# Patient Record
Sex: Male | Born: 1935 | Race: Black or African American | Hispanic: No | Smoking: Former smoker
Health system: Southern US, Community
[De-identification: ages and names within clinical notes are randomized; demographics above are authoritative.]

## PROBLEM LIST (undated history)

## (undated) DIAGNOSIS — I82409 Acute embolism and thrombosis of unspecified deep veins of unspecified lower extremity: Secondary | ICD-10-CM

## (undated) DIAGNOSIS — L409 Psoriasis, unspecified: Secondary | ICD-10-CM

## (undated) DIAGNOSIS — M109 Gout, unspecified: Secondary | ICD-10-CM

## (undated) DIAGNOSIS — N189 Chronic kidney disease, unspecified: Secondary | ICD-10-CM

## (undated) DIAGNOSIS — E119 Type 2 diabetes mellitus without complications: Secondary | ICD-10-CM

## (undated) DIAGNOSIS — I509 Heart failure, unspecified: Secondary | ICD-10-CM

## (undated) DIAGNOSIS — I10 Essential (primary) hypertension: Secondary | ICD-10-CM

## (undated) DIAGNOSIS — I499 Cardiac arrhythmia, unspecified: Secondary | ICD-10-CM

## (undated) DIAGNOSIS — I2510 Atherosclerotic heart disease of native coronary artery without angina pectoris: Secondary | ICD-10-CM

## (undated) DIAGNOSIS — I889 Nonspecific lymphadenitis, unspecified: Secondary | ICD-10-CM

## (undated) HISTORY — DX: Acute embolism and thrombosis of unspecified deep veins of unspecified lower extremity: I82.409

---

## 1997-09-28 ENCOUNTER — Emergency Department (HOSPITAL_COMMUNITY): Admission: EM | Admit: 1997-09-28 | Discharge: 1997-09-28 | Payer: Self-pay | Admitting: Emergency Medicine

## 1997-10-14 ENCOUNTER — Emergency Department (HOSPITAL_COMMUNITY): Admission: EM | Admit: 1997-10-14 | Discharge: 1997-10-14 | Payer: Self-pay | Admitting: Emergency Medicine

## 1998-01-14 ENCOUNTER — Emergency Department (HOSPITAL_COMMUNITY): Admission: EM | Admit: 1998-01-14 | Discharge: 1998-01-14 | Payer: Self-pay | Admitting: *Deleted

## 2000-02-29 ENCOUNTER — Emergency Department (HOSPITAL_COMMUNITY): Admission: EM | Admit: 2000-02-29 | Discharge: 2000-03-01 | Payer: Self-pay | Admitting: Emergency Medicine

## 2000-08-10 ENCOUNTER — Emergency Department (HOSPITAL_COMMUNITY): Admission: EM | Admit: 2000-08-10 | Discharge: 2000-08-10 | Payer: Self-pay | Admitting: Internal Medicine

## 2000-08-21 ENCOUNTER — Emergency Department (HOSPITAL_COMMUNITY): Admission: EM | Admit: 2000-08-21 | Discharge: 2000-08-21 | Payer: Self-pay

## 2000-10-15 ENCOUNTER — Emergency Department (HOSPITAL_COMMUNITY): Admission: EM | Admit: 2000-10-15 | Discharge: 2000-10-15 | Payer: Self-pay | Admitting: Emergency Medicine

## 2000-10-15 ENCOUNTER — Encounter: Payer: Self-pay | Admitting: Emergency Medicine

## 2000-10-28 ENCOUNTER — Emergency Department (HOSPITAL_COMMUNITY): Admission: EM | Admit: 2000-10-28 | Discharge: 2000-10-28 | Payer: Self-pay

## 2001-01-15 ENCOUNTER — Emergency Department (HOSPITAL_COMMUNITY): Admission: AC | Admit: 2001-01-15 | Discharge: 2001-01-15 | Payer: Self-pay

## 2001-01-15 ENCOUNTER — Encounter: Payer: Self-pay | Admitting: Emergency Medicine

## 2003-02-21 ENCOUNTER — Emergency Department (HOSPITAL_COMMUNITY): Admission: EM | Admit: 2003-02-21 | Discharge: 2003-02-21 | Payer: Self-pay | Admitting: Emergency Medicine

## 2003-11-09 ENCOUNTER — Emergency Department (HOSPITAL_COMMUNITY): Admission: EM | Admit: 2003-11-09 | Discharge: 2003-11-09 | Payer: Self-pay

## 2003-11-23 ENCOUNTER — Emergency Department (HOSPITAL_COMMUNITY): Admission: EM | Admit: 2003-11-23 | Discharge: 2003-11-24 | Payer: Self-pay | Admitting: Emergency Medicine

## 2004-12-02 ENCOUNTER — Ambulatory Visit: Payer: Self-pay | Admitting: Internal Medicine

## 2005-08-20 ENCOUNTER — Ambulatory Visit: Payer: Self-pay | Admitting: Internal Medicine

## 2005-08-22 ENCOUNTER — Ambulatory Visit: Payer: Self-pay | Admitting: Nurse Practitioner

## 2005-09-12 ENCOUNTER — Ambulatory Visit: Payer: Self-pay | Admitting: Nurse Practitioner

## 2005-09-26 ENCOUNTER — Ambulatory Visit: Payer: Self-pay | Admitting: Nurse Practitioner

## 2006-01-20 ENCOUNTER — Ambulatory Visit: Payer: Self-pay | Admitting: Nurse Practitioner

## 2006-03-15 ENCOUNTER — Emergency Department (HOSPITAL_COMMUNITY): Admission: EM | Admit: 2006-03-15 | Discharge: 2006-03-15 | Payer: Self-pay | Admitting: Emergency Medicine

## 2006-06-15 ENCOUNTER — Ambulatory Visit: Payer: Self-pay | Admitting: Internal Medicine

## 2007-05-25 ENCOUNTER — Encounter (INDEPENDENT_AMBULATORY_CARE_PROVIDER_SITE_OTHER): Payer: Self-pay | Admitting: Nurse Practitioner

## 2007-05-25 ENCOUNTER — Ambulatory Visit: Payer: Self-pay | Admitting: Family Medicine

## 2007-05-25 LAB — CONVERTED CEMR LAB
AST: 26 units/L (ref 0–37)
Albumin: 4.3 g/dL (ref 3.5–5.2)
BUN: 20 mg/dL (ref 6–23)
CO2: 22 meq/L (ref 19–32)
Creatinine, Ser: 1.35 mg/dL (ref 0.40–1.50)
Eosinophils Relative: 5 % (ref 0–5)
HCT: 46.2 % (ref 39.0–52.0)
Lymphocytes Relative: 24 % (ref 12–46)
MCHC: 32.7 g/dL (ref 30.0–36.0)
MCV: 96.9 fL (ref 78.0–100.0)
Monocytes Absolute: 0.6 10*3/uL (ref 0.1–1.0)
Monocytes Relative: 16 % — ABNORMAL HIGH (ref 3–12)
Neutro Abs: 2 10*3/uL (ref 1.7–7.7)
Neutrophils Relative %: 55 % (ref 43–77)
Platelets: 262 10*3/uL (ref 150–400)
TSH: 1.995 microintl units/mL (ref 0.350–5.50)
Total Bilirubin: 0.7 mg/dL (ref 0.3–1.2)

## 2007-07-06 ENCOUNTER — Encounter: Admission: RE | Admit: 2007-07-06 | Discharge: 2007-07-06 | Payer: Self-pay | Admitting: Nurse Practitioner

## 2007-07-21 ENCOUNTER — Ambulatory Visit: Payer: Self-pay | Admitting: Internal Medicine

## 2007-09-11 ENCOUNTER — Emergency Department (HOSPITAL_COMMUNITY): Admission: EM | Admit: 2007-09-11 | Discharge: 2007-09-11 | Payer: Self-pay | Admitting: Emergency Medicine

## 2007-11-17 ENCOUNTER — Ambulatory Visit: Payer: Self-pay | Admitting: Internal Medicine

## 2007-11-17 LAB — CONVERTED CEMR LAB
ALT: 12 units/L (ref 0–53)
AST: 22 units/L (ref 0–37)
Calcium: 9.3 mg/dL (ref 8.4–10.5)
Chloride: 104 meq/L (ref 96–112)
PSA: 4.86 ng/mL — ABNORMAL HIGH (ref 0.10–4.00)
Sodium: 139 meq/L (ref 135–145)

## 2008-03-21 ENCOUNTER — Ambulatory Visit: Payer: Self-pay | Admitting: Internal Medicine

## 2008-03-21 LAB — CONVERTED CEMR LAB
CO2: 25 meq/L (ref 19–32)
Calcium: 9 mg/dL (ref 8.4–10.5)
Chloride: 103 meq/L (ref 96–112)
Potassium: 4.2 meq/L (ref 3.5–5.3)
Sodium: 139 meq/L (ref 135–145)

## 2008-05-10 ENCOUNTER — Ambulatory Visit: Payer: Self-pay | Admitting: Internal Medicine

## 2008-05-10 LAB — CONVERTED CEMR LAB
ALT: 14 units/L (ref 0–53)
AST: 29 units/L (ref 0–37)
Alkaline Phosphatase: 51 units/L (ref 39–117)
Chloride: 102 meq/L (ref 96–112)
Creatinine, Ser: 1.24 mg/dL (ref 0.40–1.50)
LDL Cholesterol: 136 mg/dL — ABNORMAL HIGH (ref 0–99)
Potassium: 5.3 meq/L (ref 3.5–5.3)
Sodium: 139 meq/L (ref 135–145)
Total CHOL/HDL Ratio: 4.4
Triglycerides: 176 mg/dL — ABNORMAL HIGH (ref <150)
VLDL: 35 mg/dL (ref 0–40)

## 2008-05-22 ENCOUNTER — Ambulatory Visit: Payer: Self-pay | Admitting: Internal Medicine

## 2008-11-09 ENCOUNTER — Ambulatory Visit: Payer: Self-pay | Admitting: Internal Medicine

## 2008-11-16 ENCOUNTER — Ambulatory Visit: Payer: Self-pay | Admitting: Internal Medicine

## 2008-11-16 LAB — CONVERTED CEMR LAB
ALT: 21 units/L (ref 0–53)
AST: 30 units/L (ref 0–37)
BUN: 17 mg/dL (ref 6–23)
Chloride: 103 meq/L (ref 96–112)
Glucose, Bld: 174 mg/dL — ABNORMAL HIGH (ref 70–99)
Total Protein: 7.4 g/dL (ref 6.0–8.3)

## 2008-12-13 ENCOUNTER — Ambulatory Visit: Payer: Self-pay | Admitting: Internal Medicine

## 2009-02-13 ENCOUNTER — Ambulatory Visit: Payer: Self-pay | Admitting: Internal Medicine

## 2009-06-08 ENCOUNTER — Ambulatory Visit: Payer: Self-pay | Admitting: Internal Medicine

## 2009-08-21 ENCOUNTER — Ambulatory Visit: Payer: Self-pay | Admitting: Family Medicine

## 2009-10-11 ENCOUNTER — Emergency Department (HOSPITAL_COMMUNITY): Admission: EM | Admit: 2009-10-11 | Discharge: 2009-10-11 | Payer: Self-pay | Admitting: Emergency Medicine

## 2009-10-30 ENCOUNTER — Inpatient Hospital Stay (HOSPITAL_COMMUNITY): Admission: EM | Admit: 2009-10-30 | Discharge: 2009-11-03 | Payer: Self-pay | Admitting: Emergency Medicine

## 2009-11-14 ENCOUNTER — Ambulatory Visit: Payer: Self-pay | Admitting: Internal Medicine

## 2010-01-06 ENCOUNTER — Emergency Department (HOSPITAL_COMMUNITY): Admission: EM | Admit: 2010-01-06 | Discharge: 2010-01-06 | Payer: Self-pay | Admitting: Emergency Medicine

## 2010-02-26 ENCOUNTER — Encounter (INDEPENDENT_AMBULATORY_CARE_PROVIDER_SITE_OTHER): Payer: Self-pay | Admitting: Internal Medicine

## 2010-02-26 LAB — CONVERTED CEMR LAB
AST: 28 units/L (ref 0–37)
Albumin: 4.3 g/dL (ref 3.5–5.2)
Alkaline Phosphatase: 53 units/L (ref 39–117)
BUN: 18 mg/dL (ref 6–23)
CO2: 24 meq/L (ref 19–32)
Creatinine, Ser: 1.38 mg/dL (ref 0.40–1.50)
Potassium: 4.5 meq/L (ref 3.5–5.3)
Uric Acid, Serum: 8.9 mg/dL — ABNORMAL HIGH (ref 4.0–7.8)

## 2010-07-11 LAB — COMPREHENSIVE METABOLIC PANEL
Albumin: 3.3 g/dL — ABNORMAL LOW (ref 3.5–5.2)
Alkaline Phosphatase: 50 U/L (ref 39–117)
Calcium: 9.1 mg/dL (ref 8.4–10.5)
Chloride: 101 mEq/L (ref 96–112)
GFR calc non Af Amer: 46 mL/min — ABNORMAL LOW (ref >60)
Glucose, Bld: 129 mg/dL — ABNORMAL HIGH (ref 70–99)
Sodium: 135 mEq/L (ref 135–145)
Total Bilirubin: 1.3 mg/dL — ABNORMAL HIGH (ref 0.3–1.2)
Total Protein: <3 g/dL — ABNORMAL LOW (ref 6.0–8.3)

## 2010-07-14 LAB — CBC
HCT: 35.9 % — ABNORMAL LOW (ref 39.0–52.0)
HCT: 40.4 % (ref 39.0–52.0)
Hemoglobin: 11.8 g/dL — ABNORMAL LOW (ref 13.0–17.0)
Hemoglobin: 12.1 g/dL — ABNORMAL LOW (ref 13.0–17.0)
Hemoglobin: 13.6 g/dL (ref 13.0–17.0)
MCH: 32.7 pg (ref 26.0–34.0)
MCHC: 33.6 g/dL (ref 30.0–36.0)
MCV: 95.8 fL (ref 78.0–100.0)
MCV: 96 fL (ref 78.0–100.0)
Platelets: 281 10*3/uL (ref 150–400)
RBC: 3.61 MIL/uL — ABNORMAL LOW (ref 4.22–5.81)
RBC: 4.19 MIL/uL — ABNORMAL LOW (ref 4.22–5.81)
RDW: 14.4 % (ref 11.5–15.5)
RDW: 14.7 % (ref 11.5–15.5)
WBC: 4.1 10*3/uL (ref 4.0–10.5)
WBC: 5.2 10*3/uL (ref 4.0–10.5)
WBC: 6.7 10*3/uL (ref 4.0–10.5)

## 2010-07-14 LAB — CULTURE, BLOOD (ROUTINE X 2)

## 2010-07-14 LAB — URINE CULTURE
Colony Count: NO GROWTH
Culture: NO GROWTH

## 2010-07-14 LAB — URINALYSIS, ROUTINE W REFLEX MICROSCOPIC
Glucose, UA: NEGATIVE mg/dL
Hgb urine dipstick: NEGATIVE
Ketones, ur: NEGATIVE mg/dL
Nitrite: NEGATIVE
Protein, ur: NEGATIVE mg/dL
Specific Gravity, Urine: 1.019 (ref 1.005–1.030)
Urobilinogen, UA: 1 mg/dL (ref 0.0–1.0)

## 2010-07-14 LAB — POCT I-STAT, CHEM 8
BUN: 17 mg/dL (ref 6–23)
Creatinine, Ser: 1.3 mg/dL (ref 0.4–1.5)
Potassium: 3.9 mEq/L (ref 3.5–5.1)
TCO2: 28 mmol/L (ref 0–100)

## 2010-07-14 LAB — COMPREHENSIVE METABOLIC PANEL
ALT: 25 U/L (ref 0–53)
AST: 42 U/L — ABNORMAL HIGH (ref 0–37)
Alkaline Phosphatase: 61 U/L (ref 39–117)
BUN: 15 mg/dL (ref 6–23)
CO2: 27 mEq/L (ref 19–32)
Creatinine, Ser: 1.31 mg/dL (ref 0.4–1.5)
GFR calc Af Amer: >60 mL/min (ref >60)
GFR calc non Af Amer: 53 mL/min — ABNORMAL LOW (ref >60)
Glucose, Bld: 82 mg/dL (ref 70–99)
Sodium: 144 mEq/L (ref 135–145)
Total Bilirubin: 0.3 mg/dL (ref 0.3–1.2)
Total Protein: 8.9 g/dL — ABNORMAL HIGH (ref 6.0–8.3)

## 2010-07-14 LAB — BASIC METABOLIC PANEL
CO2: 28 mEq/L (ref 19–32)
Chloride: 103 mEq/L (ref 96–112)
Creatinine, Ser: 1.38 mg/dL (ref 0.4–1.5)
GFR calc Af Amer: 60 mL/min — ABNORMAL LOW (ref >60)
GFR calc non Af Amer: 54 mL/min — ABNORMAL LOW (ref >60)
Glucose, Bld: 102 mg/dL — ABNORMAL HIGH (ref 70–99)
Glucose, Bld: 108 mg/dL — ABNORMAL HIGH (ref 70–99)
Potassium: 4.1 mEq/L (ref 3.5–5.1)

## 2010-07-14 LAB — DIFFERENTIAL
Basophils Absolute: 0 10*3/uL (ref 0.0–0.1)
Eosinophils Absolute: 0.3 10*3/uL (ref 0.0–0.7)
Neutrophils Relative %: 57 % (ref 43–77)

## 2010-07-14 LAB — PROTIME-INR: Prothrombin Time: 13.4 seconds (ref 11.6–15.2)

## 2010-07-14 LAB — WOUND CULTURE

## 2011-01-22 LAB — DIFFERENTIAL
Eosinophils Absolute: 0.1
Lymphocytes Relative: 13
Lymphs Abs: 0.9
Neutro Abs: 5.3
Neutrophils Relative %: 74

## 2011-01-22 LAB — POCT I-STAT, CHEM 8
BUN: 16
Potassium: 4.2
Sodium: 137
TCO2: 29

## 2011-01-22 LAB — CBC: HCT: 39

## 2011-01-22 LAB — URIC ACID: Uric Acid, Serum: 7.6

## 2011-04-08 ENCOUNTER — Encounter: Payer: Self-pay | Admitting: *Deleted

## 2011-04-08 ENCOUNTER — Emergency Department (HOSPITAL_COMMUNITY)
Admission: EM | Admit: 2011-04-08 | Discharge: 2011-04-08 | Disposition: A | Discharge status: Home / Self Care | Payer: Medicare Other | Attending: Emergency Medicine | Admitting: Emergency Medicine

## 2011-04-08 DIAGNOSIS — I10 Essential (primary) hypertension: Secondary | ICD-10-CM | POA: Insufficient documentation

## 2011-04-08 DIAGNOSIS — Z760 Encounter for issue of repeat prescription: Secondary | ICD-10-CM | POA: Insufficient documentation

## 2011-04-08 DIAGNOSIS — Z79899 Other long term (current) drug therapy: Secondary | ICD-10-CM | POA: Insufficient documentation

## 2011-04-08 HISTORY — DX: Essential (primary) hypertension: I10

## 2011-04-08 MED ORDER — VERAPAMIL HCL ER 240 MG PO TBCR: 240.0000 mg | EXTENDED_RELEASE_TABLET | Freq: Every day | ORAL | Status: DC

## 2011-04-08 MED ORDER — ALLOPURINOL 300 MG PO TABS: 300.0000 mg | ORAL_TABLET | Freq: Every day | ORAL | Status: DC

## 2011-04-08 MED ORDER — HYDROCODONE-ACETAMINOPHEN 5-325 MG PO TABS: 1.0000 | ORAL_TABLET | Freq: Four times a day (QID) | ORAL | Status: AC | PRN

## 2011-04-08 MED ORDER — LOSARTAN POTASSIUM 100 MG PO TABS: 100.0000 mg | ORAL_TABLET | Freq: Every day | ORAL | Status: DC

## 2011-04-08 NOTE — ED Provider Notes (Signed)
History     CSN: [id number] Arrival date & time: 04/08/2011  1:13 PM   First MD Initiated Contact with Patient 04/08/11 1442      Chief Complaint  Patient presents with  . Medication Refill    (Consider location/radiation/quality/duration/timing/severity/associated sxs/prior treatment) HPI The patient presents with request for assistance in and new prescriptions. He notes that his doctor left the practice and he has been unable to reestablish care with a new doctor. He denies any new complaints, states "I feel good". Past Medical History  Diagnosis Date  . Hypertension     History reviewed. No pertinent past surgical history.  History reviewed. No pertinent family history.  History  Substance Use Topics  . Smoking status: Current Everyday Smoker    Types: Pipe  . Smokeless tobacco: Not on file  . Alcohol Use: No      Review of Systems  All other systems reviewed and are negative.    Allergies  Review of patient's allergies indicates no known allergies.  Home Medications   Current Outpatient Rx  Name Route Sig Dispense Refill  . VERAPAMIL HCL ER 240 MG PO TBCR Oral Take 240 mg by mouth daily.      . ALLOPURINOL 300 MG PO TABS Oral Take 300 mg by mouth daily.      Marland Kitchen HYDROCODONE-ACETAMINOPHEN 5-325 MG PO TABS Oral Take 1-2 tablets by mouth every 6 (six) hours as needed. As needed for pain.     Marland Kitchen LOSARTAN POTASSIUM 100 MG PO TABS Oral Take 100 mg by mouth daily.        BP 147/79  Pulse 89  Temp(Src) 98.3 F (36.8 C) (Oral)  Resp 24  SpO2 96%  Physical Exam  Constitutional: He is oriented to person, place, and time. He appears well-developed and well-nourished. No distress.  HENT:  Head: Normocephalic and atraumatic.  Eyes: Conjunctivae and EOM are normal.  Pulmonary/Chest: Effort normal. No stridor.  Neurological: He is alert and oriented to person, place, and time. No cranial nerve deficit. He exhibits normal muscle tone. Coordination normal.  Skin: He  is not diaphoretic.  Psychiatric: He has a normal mood and affect.    ED Course  Procedures (including critical care time)  Labs Reviewed - No data to display No results found.   No diagnosis found.    MDM  This elderly gentleman who is in no distress, with normal vital signs requests assistance with medications. The patient was counseled on necessity of establishing care with a new primary care physician. Absent any complaints, with no abnormal vital signs the patient will be discharged with a prescription for his medications and the expectation to follow up with a new primary care physician for continued evaluation and management of his medical conditions        Carmin Muskrat, MD 04/08/11 1456

## 2011-04-08 NOTE — ED Notes (Signed)
To ed for refill of home meds. States he was seeing [facility] but missed an appt and now they will not see him.

## 2011-04-08 NOTE — ED Notes (Signed)
Pt says that he has been going to [facility] and that he missed an appointment, and now they will not see him/refill his medication. Request to have his meds refilled- the vicodin, allopurinol, and verapamil

## 2011-06-11 ENCOUNTER — Ambulatory Visit: Payer: Medicare Other | Admitting: Physical Therapy

## 2011-06-17 ENCOUNTER — Ambulatory Visit: Payer: Medicare Other | Attending: Family Medicine | Admitting: Physical Therapy

## 2011-06-17 DIAGNOSIS — R262 Difficulty in walking, not elsewhere classified: Secondary | ICD-10-CM | POA: Insufficient documentation

## 2011-06-17 DIAGNOSIS — IMO0001 Reserved for inherently not codable concepts without codable children: Secondary | ICD-10-CM | POA: Insufficient documentation

## 2012-08-05 ENCOUNTER — Inpatient Hospital Stay (HOSPITAL_COMMUNITY)
Admission: EM | Admit: 2012-08-05 | Discharge: 2012-08-13 | DRG: 593 | Disposition: A | Discharge status: Other Acute Inpatient Hospital | Payer: Medicare Other | Attending: Family Medicine | Admitting: Family Medicine

## 2012-08-05 ENCOUNTER — Emergency Department (HOSPITAL_COMMUNITY): Payer: Medicare Other

## 2012-08-05 ENCOUNTER — Encounter (HOSPITAL_COMMUNITY): Payer: Self-pay | Admitting: Emergency Medicine

## 2012-08-05 DIAGNOSIS — E662 Morbid (severe) obesity with alveolar hypoventilation: Secondary | ICD-10-CM | POA: Diagnosis present

## 2012-08-05 DIAGNOSIS — L03119 Cellulitis of unspecified part of limb: Secondary | ICD-10-CM | POA: Diagnosis present

## 2012-08-05 DIAGNOSIS — I509 Heart failure, unspecified: Secondary | ICD-10-CM | POA: Diagnosis present

## 2012-08-05 DIAGNOSIS — A46 Erysipelas: Secondary | ICD-10-CM | POA: Diagnosis present

## 2012-08-05 DIAGNOSIS — L02619 Cutaneous abscess of unspecified foot: Secondary | ICD-10-CM | POA: Diagnosis present

## 2012-08-05 DIAGNOSIS — L03115 Cellulitis of right lower limb: Secondary | ICD-10-CM

## 2012-08-05 DIAGNOSIS — N189 Chronic kidney disease, unspecified: Secondary | ICD-10-CM | POA: Diagnosis present

## 2012-08-05 DIAGNOSIS — L0390 Cellulitis, unspecified: Secondary | ICD-10-CM

## 2012-08-05 DIAGNOSIS — I10 Essential (primary) hypertension: Secondary | ICD-10-CM | POA: Diagnosis present

## 2012-08-05 DIAGNOSIS — F17200 Nicotine dependence, unspecified, uncomplicated: Secondary | ICD-10-CM | POA: Diagnosis present

## 2012-08-05 DIAGNOSIS — K219 Gastro-esophageal reflux disease without esophagitis: Secondary | ICD-10-CM | POA: Diagnosis present

## 2012-08-05 DIAGNOSIS — I889 Nonspecific lymphadenitis, unspecified: Secondary | ICD-10-CM | POA: Diagnosis present

## 2012-08-05 DIAGNOSIS — N289 Disorder of kidney and ureter, unspecified: Secondary | ICD-10-CM | POA: Diagnosis present

## 2012-08-05 DIAGNOSIS — N058 Unspecified nephritic syndrome with other morphologic changes: Secondary | ICD-10-CM | POA: Diagnosis present

## 2012-08-05 DIAGNOSIS — L97909 Non-pressure chronic ulcer of unspecified part of unspecified lower leg with unspecified severity: Secondary | ICD-10-CM

## 2012-08-05 DIAGNOSIS — E119 Type 2 diabetes mellitus without complications: Secondary | ICD-10-CM

## 2012-08-05 DIAGNOSIS — L97509 Non-pressure chronic ulcer of other part of unspecified foot with unspecified severity: Principal | ICD-10-CM | POA: Diagnosis present

## 2012-08-05 DIAGNOSIS — M109 Gout, unspecified: Secondary | ICD-10-CM | POA: Diagnosis present

## 2012-08-05 DIAGNOSIS — R7309 Other abnormal glucose: Secondary | ICD-10-CM

## 2012-08-05 DIAGNOSIS — N179 Acute kidney failure, unspecified: Secondary | ICD-10-CM | POA: Diagnosis present

## 2012-08-05 DIAGNOSIS — L97911 Non-pressure chronic ulcer of unspecified part of right lower leg limited to breakdown of skin: Secondary | ICD-10-CM

## 2012-08-05 DIAGNOSIS — L049 Acute lymphadenitis, unspecified: Secondary | ICD-10-CM | POA: Diagnosis present

## 2012-08-05 DIAGNOSIS — L02419 Cutaneous abscess of limb, unspecified: Secondary | ICD-10-CM | POA: Diagnosis present

## 2012-08-05 DIAGNOSIS — Z720 Tobacco use: Secondary | ICD-10-CM | POA: Diagnosis present

## 2012-08-05 DIAGNOSIS — L97522 Non-pressure chronic ulcer of other part of left foot with fat layer exposed: Secondary | ICD-10-CM

## 2012-08-05 DIAGNOSIS — R739 Hyperglycemia, unspecified: Secondary | ICD-10-CM | POA: Diagnosis present

## 2012-08-05 DIAGNOSIS — I129 Hypertensive chronic kidney disease with stage 1 through stage 4 chronic kidney disease, or unspecified chronic kidney disease: Secondary | ICD-10-CM | POA: Diagnosis present

## 2012-08-05 DIAGNOSIS — I5030 Unspecified diastolic (congestive) heart failure: Secondary | ICD-10-CM | POA: Diagnosis present

## 2012-08-05 DIAGNOSIS — Z6835 Body mass index (BMI) 35.0-35.9, adult: Secondary | ICD-10-CM

## 2012-08-05 DIAGNOSIS — B9689 Other specified bacterial agents as the cause of diseases classified elsewhere: Secondary | ICD-10-CM | POA: Diagnosis present

## 2012-08-05 DIAGNOSIS — E1129 Type 2 diabetes mellitus with other diabetic kidney complication: Secondary | ICD-10-CM | POA: Diagnosis present

## 2012-08-05 HISTORY — DX: Chronic kidney disease, unspecified: N18.9

## 2012-08-05 HISTORY — DX: Gout, unspecified: M10.9

## 2012-08-05 HISTORY — DX: Nonspecific lymphadenitis, unspecified: I88.9

## 2012-08-05 LAB — CBC WITH DIFFERENTIAL/PLATELET
Eosinophils Relative: 4 % (ref 0–5)
Hemoglobin: 12.1 g/dL — ABNORMAL LOW (ref 13.0–17.0)
Lymphocytes Relative: 17 % (ref 12–46)
Lymphs Abs: 0.9 10*3/uL (ref 0.7–4.0)
MCV: 92.4 fL (ref 78.0–100.0)
Neutrophils Relative %: 69 % (ref 43–77)
Platelets: 304 10*3/uL (ref 150–400)
RBC: 3.83 MIL/uL — ABNORMAL LOW (ref 4.22–5.81)
WBC: 5.3 10*3/uL (ref 4.0–10.5)

## 2012-08-05 LAB — COMPREHENSIVE METABOLIC PANEL
ALT: 8 U/L (ref 0–53)
Alkaline Phosphatase: 49 U/L (ref 39–117)
CO2: 28 mEq/L (ref 19–32)
GFR calc Af Amer: 52 mL/min — ABNORMAL LOW (ref >90)
GFR calc non Af Amer: 45 mL/min — ABNORMAL LOW (ref >90)
Glucose, Bld: 137 mg/dL — ABNORMAL HIGH (ref 70–99)
Potassium: 4.1 mEq/L (ref 3.5–5.1)
Sodium: 140 mEq/L (ref 135–145)

## 2012-08-05 LAB — CBC
MCH: 30.6 pg (ref 26.0–34.0)
Platelets: 284 10*3/uL (ref 150–400)
RBC: 3.59 MIL/uL — ABNORMAL LOW (ref 4.22–5.81)
WBC: 6.7 10*3/uL (ref 4.0–10.5)

## 2012-08-05 LAB — CREATININE, SERUM: Creatinine, Ser: 1.36 mg/dL — ABNORMAL HIGH (ref 0.50–1.35)

## 2012-08-05 LAB — MAGNESIUM: Magnesium: 2.1 mg/dL (ref 1.5–2.5)

## 2012-08-05 MED ORDER — ONDANSETRON HCL 4 MG/2ML IJ SOLN: 4.0000 mg | Freq: Four times a day (QID) | INTRAMUSCULAR | Status: DC | PRN

## 2012-08-05 MED ORDER — MORPHINE SULFATE 2 MG/ML IJ SOLN
1.0000 mg | INTRAMUSCULAR | Status: DC | PRN
  Administered 2012-08-06 – 2012-08-13 (×3): 1 mg via INTRAVENOUS
  Filled 2012-08-05 (×3): qty 1

## 2012-08-05 MED ORDER — ONDANSETRON HCL 4 MG PO TABS: 4.0000 mg | ORAL_TABLET | Freq: Four times a day (QID) | ORAL | Status: DC | PRN

## 2012-08-05 MED ORDER — VANCOMYCIN HCL 10 G IV SOLR
2000.0000 mg | INTRAVENOUS | Status: AC
  Administered 2012-08-05: 2000 mg via INTRAVENOUS
  Filled 2012-08-05: qty 2000

## 2012-08-05 MED ORDER — ISOSORB DINITRATE-HYDRALAZINE 20-37.5 MG PO TABS
1.0000 | ORAL_TABLET | Freq: Two times a day (BID) | ORAL | Status: DC
  Administered 2012-08-05 – 2012-08-13 (×16): 1 via ORAL
  Filled 2012-08-05 (×18): qty 1

## 2012-08-05 MED ORDER — VERAPAMIL HCL ER 240 MG PO TBCR
240.0000 mg | EXTENDED_RELEASE_TABLET | Freq: Every day | ORAL | Status: DC
  Administered 2012-08-05: 240 mg via ORAL
  Filled 2012-08-05 (×2): qty 1

## 2012-08-05 MED ORDER — PIPERACILLIN-TAZOBACTAM 3.375 G IVPB 30 MIN
3.3750 g | INTRAVENOUS | Status: AC
  Administered 2012-08-05: 3.375 g via INTRAVENOUS

## 2012-08-05 MED ORDER — VANCOMYCIN HCL 10 G IV SOLR
1500.0000 mg | INTRAVENOUS | Status: DC
  Administered 2012-08-06 – 2012-08-12 (×7): 1500 mg via INTRAVENOUS
  Filled 2012-08-05 (×7): qty 1500

## 2012-08-05 MED ORDER — ACETAMINOPHEN 325 MG PO TABS
650.0000 mg | ORAL_TABLET | Freq: Four times a day (QID) | ORAL | Status: DC | PRN
  Filled 2012-08-05: qty 2

## 2012-08-05 MED ORDER — ACETAMINOPHEN 650 MG RE SUPP: 650.0000 mg | Freq: Four times a day (QID) | RECTAL | Status: DC | PRN

## 2012-08-05 MED ORDER — ALLOPURINOL 300 MG PO TABS
300.0000 mg | ORAL_TABLET | Freq: Every day | ORAL | Status: DC
  Administered 2012-08-05 – 2012-08-13 (×9): 300 mg via ORAL
  Filled 2012-08-05 (×9): qty 1

## 2012-08-05 MED ORDER — NICOTINE 14 MG/24HR TD PT24
14.0000 mg | MEDICATED_PATCH | Freq: Every day | TRANSDERMAL | Status: DC
  Administered 2012-08-05: 14 mg via TRANSDERMAL
  Filled 2012-08-05 (×9): qty 1

## 2012-08-05 MED ORDER — SODIUM CHLORIDE 0.9 % IV SOLN
INTRAVENOUS | Status: AC
  Administered 2012-08-05: 17:00:00 via INTRAVENOUS

## 2012-08-05 MED ORDER — TRAMADOL HCL 50 MG PO TABS
100.0000 mg | ORAL_TABLET | Freq: Four times a day (QID) | ORAL | Status: DC | PRN
  Administered 2012-08-05 – 2012-08-13 (×8): 100 mg via ORAL
  Filled 2012-08-05 (×9): qty 2

## 2012-08-05 MED ORDER — PIPERACILLIN-TAZOBACTAM 3.375 G IVPB
3.3750 g | Freq: Three times a day (TID) | INTRAVENOUS | Status: DC
  Administered 2012-08-05 – 2012-08-13 (×23): 3.375 g via INTRAVENOUS
  Filled 2012-08-05 (×27): qty 50

## 2012-08-05 MED ORDER — HEPARIN SODIUM (PORCINE) 5000 UNIT/ML IJ SOLN
5000.0000 [IU] | Freq: Three times a day (TID) | INTRAMUSCULAR | Status: DC
  Administered 2012-08-05 – 2012-08-13 (×24): 5000 [IU] via SUBCUTANEOUS
  Filled 2012-08-05 (×27): qty 1

## 2012-08-05 MED ORDER — PANTOPRAZOLE SODIUM 40 MG PO TBEC
40.0000 mg | DELAYED_RELEASE_TABLET | Freq: Every day | ORAL | Status: DC
  Administered 2012-08-06 – 2012-08-12 (×6): 40 mg via ORAL
  Filled 2012-08-05 (×7): qty 1

## 2012-08-05 MED ORDER — ASPIRIN EC 81 MG PO TBEC
81.0000 mg | DELAYED_RELEASE_TABLET | Freq: Every day | ORAL | Status: DC
  Administered 2012-08-05 – 2012-08-13 (×9): 81 mg via ORAL
  Filled 2012-08-05 (×9): qty 1

## 2012-08-05 NOTE — H&P (Addendum)
[facility] History and Physical  Kenyon Cramer [id number] DOB: August 03, 1926 DOA: 08/05/2012  Referring physician: Dr. Elise Benne PCP: No PCP Per Patient    Chief Complaint: LE swelling, erythema and skin ulcers  HPI: [name] [name] is a 79 y.o. male with pmh significant for tobacco abuse and HTN; came to ED complaining of worsening swelling, erythema and pain on his LE bilaterally. Patient reports skin lesions and subsequent ulcers on his LE bilaterally; associated swelling and erythema; present for many months now and worsening. Now with purulent discharge and unable to wear shoes due to swelling. Patient endorses some intermittently SOB.  Denies CP, fever, chills, Ha's, abdominal pain, diarrhea, nausea, vomiting or any other acute complaints.   Review of Systems:  Negative except as otherwise mentioned on HPI.  Past Medical History  Diagnosis Date  . Hypertension    History reviewed. No pertinent past surgical history.  Social History:  reports that he has been smoking Pipe.  He does not have any smokeless tobacco history on file. He reports that he does not drink alcohol. His drug history is not on file.  No Known Allergies  Family history: significant for HTN and diabetes.  Prior to Admission medications   Medication Sig Start Date End Date Taking? Authorizing Provider  allopurinol (ZYLOPRIM) 100 MG tablet Take 100 mg by mouth daily.   Yes Historical Provider, MD  clobetasol cream (TEMOVATE) [id number] % Apply 1 application topically 2 (two) times daily.   Yes Historical Provider, MD  losartan (COZAAR) 25 MG tablet Take 25 mg by mouth daily.   Yes Historical Provider, MD  traMADol (ULTRAM) 50 MG tablet Take 100 mg by mouth 2 (two) times daily.   Yes Historical Provider, MD  allopurinol (ZYLOPRIM) 300 MG tablet Take 1 tablet (300 mg total) by mouth daily. 04/08/11 04/07/12  Carmin Muskrat, MD  fluticasone (CUTIVATE) 0.05 % cream Apply 1 application topically 2 (two) times daily as  needed. Apply to affected area of face twice daily as needed.     Historical Provider, MD  indomethacin (INDOCIN) 25 MG capsule Take 25 mg by mouth 3 (three) times daily as needed (gout).     Historical Provider, MD  verapamil (CALAN-SR) 240 MG CR tablet Take 240 mg by mouth daily.     Historical Provider, MD  verapamil (CALAN-SR) 240 MG CR tablet Take 1 tablet (240 mg total) by mouth at bedtime. 04/08/11 04/07/12  Carmin Muskrat, MD   Physical Exam: Filed Vitals:   08/05/12 1244 08/05/12 1525  BP: 162/140 142/80  Pulse: 100 86  Temp: 97.5 F (36.4 C)   TempSrc: Oral   Resp: 18 20  SpO2: 99% 98%     General:  Afebrile, NAD, AAOX3, cooperative with examination  Eyes: PERRL, no icterus, no nystagmus and EOMI  ENT: no erythema or exudates inside his mouth, no drainage out of ears or nostrils.  Neck: supple, no thyromegaly, no bruits  Cardiovascular: S1 and S2, no rubs or gallops  Respiratory: CTA bilaterally  Abdomen: soft, NT, no distended, no rebound or guarding. Positive BS  Skin: bilateral LE with open wounds, purulent smelly discharge and redness; some erysipeloids changes.   Musculoskeletal: 3 ++ edema bilaterally with induration; no other joint swelling or pain.  Psychiatric: stable and appropriate  Neurologic: no focal motor or sensory deficit appreciated.CN grossly intact and able to move 4 limbs with preserved strength   Labs on Admission:  Basic Metabolic Panel:  Recent Labs Lab 08/05/12 1246  NA 140  K 4.1  CL 102  CO2 28  GLUCOSE 137*  BUN 12  CREATININE 1.39*  CALCIUM 9.8   Liver Function Tests:  Recent Labs Lab 08/05/12 1246  AST 25  ALT 8  ALKPHOS 49  BILITOT 0.4  PROT 8.1  ALBUMIN 3.1*   CBC:  Recent Labs Lab 08/05/12 1246  WBC 5.3  NEUTROABS 3.6  HGB 12.1*  HCT 35.4*  MCV 92.4  PLT 304   Radiological Exams on Admission: Dg Tibia/fibula Left  08/05/2012  *RADIOLOGY REPORT*  Clinical Data: Bilateral lower leg pain and skin  ulcerations.  LEFT TIBIA AND FIBULA - 2 VIEW  Comparison: None.  Findings: Skin irregularity consistent with ulcerations is noted. No soft tissue gas collection, radiopaque foreign body or bony destructive change is identified.  There is no knee joint effusion. 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 There appears to be an erosion of the first tarsometatarsal joint suggestive of gout.  IMPRESSION:  1.  Skin ulcerations without underlying acute bony or joint abnormality. 2.  Tibiotalar degenerative change. 3.  Question gout of the first tarsometatarsal joint.   Original Report Authenticated By: Orlean Patten, M.D.    Dg Tibia/fibula Right  08/05/2012  **ADDENDUM** CREATED: 08/05/2012 15:30:31  This addendum is given for the purpose of noting the patient has marked appearing tibiotalar degenerative disease.  **END ADDENDUM** SIGNED BY: Marcello Moores L. Luther Parody, M.D.   08/05/2012  *RADIOLOGY REPORT*  Clinical Data: Bilateral lower extremity skin ulcerations.  RIGHT TIBIA AND FIBULA - 2 VIEW  Comparison: None.  Findings: Irregularity of cutaneous tissues is consistent with multiple skin ulcerations.  No radiopaque foreign body or soft tissue gas collection is identified.  No bony destructive change to suggest osteomyelitis is seen.  There is no fracture.  IMPRESSION: Multiple skin ulceration without underlying evidence of osteomyelitis.   Original Report Authenticated By: Orlean Patten, M.D.    Dg Foot 2 Views Left  08/05/2012  *RADIOLOGY REPORT*  Clinical Data: Foot pain with soft tissue ulceration  LEFT FOOT - 2 VIEW  Comparison: None.  Findings: Bones are osteopenic, which may mask subtle fracture. Allowing for two-view technique, there is no displaced fracture or dislocation.  No radiopaque foreign body.  IMPRESSION: Bones are osteopenic, which may mask subtle fracture.  No displaced fracture or dislocation; if there is persistent concern for radiographically occult trauma or  osteomyelitis, MRI with contrast would be recommended.   Original Report Authenticated By: Conchita Paris, M.D.    Dg Foot 2 Views Right  08/05/2012  *RADIOLOGY REPORT*  Clinical Data: Foot pain and soft tissue ulceration  RIGHT FOOT - 2 VIEW  Comparison: None.  Findings: Degenerative changes are noted at the first metatarsal phalangeal joint and first interphalangeal joint. Bones are osteopenic, which may mask subtle fracture.  Examination is suboptimal due to two-view technique and positioning.  There is cortical irregularity at the base of the proximal phalanx of the great toe, best seen on the lateral projection.  No radiopaque foreign body.  No gross evidence for fracture or dislocation.  IMPRESSION: Suboptimal exam as above and with osteopenia that may decrease sensitivity for detection of fracture.  Cortical irregularity at the base of the proximal phalanx of the great toe.  This could be remote, however chronic osteomyelitis could have a similar appearance.  Consider MRI with contrast for further evaluation.   Original Report Authenticated By: Conchita Paris, M.D.     Assessment/Plan 1-presumed erysipelas of lower extremity/cellulitis: concern for long term infection of  his skin. Changes and discharge demonstrating infection present for many months. Other concerns with bilateral swelling is heart failure, patient also endorses some SOB. -will take cx's and start empiric antibiotic therapy -consult wound care service -will check ABI -x-ray results not demonstrating osteomyelitis; but unfortunately not ruling it out either. Will improved renal function and ordered MRI for better evaluation and extension of his lesions. -PRN analgesics -depending results and clinical evolution might need vasc surgeons vs orthopedist. -will ceck 2-D echo, as swelling might be due to right heart failure.  2-HTN (hypertension): will discontinue cozaar at this moment, given renal failure (acute on chronic). Will  continue verapamil and start him on bidil.  3-SOB: intermittent. OHS might be contributing; but given long-term HTN and bilateral leg swelling this could be due to CHF. -Check 2-D echo -Check BNP  4-Renal insufficiency/failure: acute on chronic (baseline per labs was stage 2 in 2011); now GFR in the low 40's. -will discontinue cozaar -gentle hydration -follow BMET and check UA.  5-Tobacco abuse: couseniling provided. Started on nicotine patch.  6-Gout: continue allopurinol  7-Hyperglycemia: w/o hx of diabetes. Will check fasting CBG and A1C.  8-GERD: started on PPI  DVT: heparin.   Wound care service (eval and treatment on LE ulcers and skin lesions)  Code Status: Full Family Communication: no family at bedside Disposition Plan: admit to med-surg; inpatient status. LOS > 2 midnights  Time spent: >30 minutes  [name] [name] [facility] Pager [phone]  If 7PM-7AM, please contact night-coverage [email or web address] Password [email or web address] 08/05/2012, 4:12 PM

## 2012-08-05 NOTE — ED Notes (Signed)
Pt sts told by PCP he needed to have his legs amputated and now here for second opinion; pt noted to have wounds to leg; pt denies pain; pt sts hx of shingles; pt poor historian

## 2012-08-05 NOTE — ED Provider Notes (Signed)
History     CSN: [id number]  Arrival date & time 08/05/12  1235   First MD Initiated Contact with Patient 08/05/12 1400      Chief Complaint  Patient presents with  . Leg Pain  . Wound Check     HPI Pt was seen at 1400.   Per pt and his family, c/o gradual onset and worsening of constant bilateral lower legs "rash" and "ulcers" for the past 6+ months, worse over the past several weeks.  Pt's family states he was eval last month by his PMD at [facility] for same and was told that the rash "was shingles or maybe gout" and that "he needed both his legs amputated." Denies fevers, no other areas of rash, no focal motor weakness, no tingling/numbness in extremities.     Past Medical History  Diagnosis Date  . Hypertension     History reviewed. No pertinent past surgical history.    History  Substance Use Topics  . Smoking status: Current Every Day Smoker    Types: Pipe  . Smokeless tobacco: Not on file  . Alcohol Use: No      Review of Systems ROS: Statement: All systems negative except as marked or noted in the HPI; Constitutional: Negative for fever and chills. ; ; Eyes: Negative for eye pain, redness and discharge. ; ; ENMT: Negative for ear pain, hoarseness, nasal congestion, sinus pressure and sore throat. ; ; Cardiovascular: Negative for chest pain, palpitations, diaphoresis, dyspnea and peripheral edema. ; ; Respiratory: Negative for cough, wheezing and stridor. ; ; Gastrointestinal: Negative for nausea, vomiting, diarrhea, abdominal pain, blood in stool, hematemesis, jaundice and rectal bleeding. . ; ; Genitourinary: Negative for dysuria, flank pain and hematuria. ; ; Musculoskeletal: Negative for back pain and neck pain. Negative for swelling and trauma.; ; Skin: Negative for pruritus, blisters, bruising and +rash, skin lesion.; ; Neuro: Negative for headache, lightheadedness and neck stiffness. Negative for weakness, altered level of consciousness , altered mental  status, extremity weakness, paresthesias, involuntary movement, seizure and syncope.       Allergies  Review of patient's allergies indicates no known allergies.  Home Medications   Current Outpatient Rx  Name  Route  Sig  Dispense  Refill  . allopurinol (ZYLOPRIM) 100 MG tablet   Oral   Take 100 mg by mouth daily.         . clobetasol cream (TEMOVATE) 0.05 %   Topical   Apply 1 application topically 2 (two) times daily.         Marland Kitchen losartan (COZAAR) 25 MG tablet   Oral   Take 25 mg by mouth daily.         . traMADol (ULTRAM) 50 MG tablet   Oral   Take 100 mg by mouth 2 (two) times daily.         Marland Kitchen EXPIRED: allopurinol (ZYLOPRIM) 300 MG tablet   Oral   Take 1 tablet (300 mg total) by mouth daily.   15 tablet   0   . fluticasone (CUTIVATE) 0.05 % cream   Topical   Apply 1 application topically 2 (two) times daily as needed. Apply to affected area of face twice daily as needed.          . indomethacin (INDOCIN) 25 MG capsule   Oral   Take 25 mg by mouth 3 (three) times daily as needed (gout).          . verapamil (CALAN-SR) 240 MG CR  tablet   Oral   Take 240 mg by mouth daily.          Marland Kitchen EXPIRED: verapamil (CALAN-SR) 240 MG CR tablet   Oral   Take 1 tablet (240 mg total) by mouth at bedtime.   15 tablet   0     BP 162/140  Pulse 100  Temp(Src) 97.5 F (36.4 C) (Oral)  Resp 18  SpO2 99%  Physical Exam 1405: Physical examination:  Nursing notes reviewed; Vital signs and O2 SAT reviewed;  Constitutional: Well developed, Well nourished, Well hydrated, In no acute distress; Head:  Normocephalic, atraumatic; Eyes: EOMI, PERRL, No scleral icterus; ENMT: Mouth and pharynx normal, Mucous membranes moist; Neck: Supple, Full range of motion, No lymphadenopathy; Cardiovascular: Regular rate and rhythm, No gallop; Respiratory: Breath sounds clear & equal bilaterally, No rales, rhonchi, wheezes.  Speaking full sentences with ease, Normal respiratory  effort/excursion; Chest: Nontender, Movement normal; Abdomen: Soft, Nontender, Nondistended, Normal bowel sounds;; Extremities: +doppler pedal pulses bilat. No deformity. +3 edema bilat LE's. No calf asymmetry. +bilat LE's knees to feet with nodular rash, several areas of excoriation and ulcerations to LE's, feet and toes with foul smelling purulent drainage.; Neuro: AA&Ox3, poor historian.  Major CN grossly intact. No facial droop. Speech clear. Moves all ext on stretcher..; Skin: Color normal, Warm, Dry.   ED Course  Procedures     MDM  MDM Reviewed: previous chart, nursing note and vitals Reviewed previous: labs Interpretation: labs and x-ray     Results for orders placed during the hospital encounter of 08/05/12  CBC WITH DIFFERENTIAL      Result Value Range   WBC 5.3  4.0 - 10.5 K/uL   RBC 3.83 (*) 4.22 - 5.81 MIL/uL   Hemoglobin 12.1 (*) 13.0 - 17.0 g/dL   HCT 35.4 (*) 39.0 - 52.0 %   MCV 92.4  78.0 - 100.0 fL   MCH 31.6  26.0 - 34.0 pg   MCHC 34.2  30.0 - 36.0 g/dL   RDW 13.8  11.5 - 15.5 %   Platelets 304  150 - 400 K/uL   Neutrophils Relative 69  43 - 77 %   Neutro Abs 3.6  1.7 - 7.7 K/uL   Lymphocytes Relative 17  12 - 46 %   Lymphs Abs 0.9  0.7 - 4.0 K/uL   Monocytes Relative 11  3 - 12 %   Monocytes Absolute 0.6  0.1 - 1.0 K/uL   Eosinophils Relative 4  0 - 5 %   Eosinophils Absolute 0.2  0.0 - 0.7 K/uL   Basophils Relative 1  0 - 1 %   Basophils Absolute 0.0  0.0 - 0.1 K/uL  COMPREHENSIVE METABOLIC PANEL      Result Value Range   Sodium 140  135 - 145 mEq/L   Potassium 4.1  3.5 - 5.1 mEq/L   Chloride 102  96 - 112 mEq/L   CO2 28  19 - 32 mEq/L   Glucose, Bld 137 (*) 70 - 99 mg/dL   BUN 12  6 - 23 mg/dL   Creatinine, Ser 1.39 (*) 0.50 - 1.35 mg/dL   Calcium 9.8  8.4 - 10.5 mg/dL   Total Protein 8.1  6.0 - 8.3 g/dL   Albumin 3.1 (*) 3.5 - 5.2 g/dL   AST 25  0 - 37 U/L   ALT 8  0 - 53 U/L   Alkaline Phosphatase 49  39 - 117 U/L   Total Bilirubin 0.4  0.3  - 1.2 mg/dL   GFR calc non Af Amer 45 (*) >90 mL/min   GFR calc Af Amer 52 (*) >90 mL/min    Results for MATHIAS, CICERO (MRN [id number]) as of 08/05/2012 15:02  Ref. Range 10/31/2009 06:21 11/01/2009 06:00 01/06/2010 17:12 02/26/2010 20:04 08/05/2012 12:46  BUN Latest Range: 6-23 mg/dL 13 11 16 18 12   Creatinine Latest Range: 0.50-1.35 mg/dL 1.28 1.38 1.48 1.38 1.39 (H)    Results for GAUGE, SKLUZACEK (MRN [id number]) as of 08/05/2012 15:24  Ref. Range 10/31/2009 06:21 11/01/2009 06:00 08/05/2012 12:46  Hemoglobin Latest Range: 13.0-17.0 g/dL 12.1 DELTA CHECK NOTED (L) 11.8 (L) 12.1 (L)  HCT Latest Range: 39.0-52.0 % 35.9 (L) 34.6 (L) 35.4 (L)    Dg Tibia/fibula Left 08/05/2012  *RADIOLOGY REPORT*  Clinical Data: Bilateral lower leg pain and skin ulcerations.  LEFT TIBIA AND FIBULA - 2 VIEW  Comparison: None.  Findings: Skin irregularity consistent with ulcerations is noted. No soft tissue gas collection, radiopaque foreign body or bony destructive change is identified.  There is no knee joint effusion. 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 There appears to be an erosion of the first tarsometatarsal joint suggestive of gout.  IMPRESSION:  1.  Skin ulcerations without underlying acute bony or joint abnormality. 2.  Tibiotalar degenerative change. 3.  Question gout of the first tarsometatarsal joint.   Original Report Authenticated By: Orlean Patten, M.D.    Dg Tibia/fibula Right 08/05/2012  **ADDENDUM** CREATED: 08/05/2012 15:30:31  This addendum is given for the purpose of noting the patient has marked appearing tibiotalar degenerative disease.  **END ADDENDUM** SIGNED BY: Marcello Moores L. Luther Parody, M.D.   08/05/2012  *RADIOLOGY REPORT*  Clinical Data: Bilateral lower extremity skin ulcerations.  RIGHT TIBIA AND FIBULA - 2 VIEW  Comparison: None.  Findings: Irregularity of cutaneous tissues is consistent with multiple skin ulcerations.  No radiopaque foreign body or soft tissue gas collection is  identified.  No bony destructive change to suggest osteomyelitis is seen.  There is no fracture.  IMPRESSION: Multiple skin ulceration without underlying evidence of osteomyelitis.   Original Report Authenticated By: Orlean Patten, M.D.    Dg Foot 2 Views Left 08/05/2012  *RADIOLOGY REPORT*  Clinical Data: Foot pain with soft tissue ulceration  LEFT FOOT - 2 VIEW  Comparison: None.  Findings: Bones are osteopenic, which may mask subtle fracture. Allowing for two-view technique, there is no displaced fracture or dislocation.  No radiopaque foreign body.  IMPRESSION: Bones are osteopenic, which may mask subtle fracture.  No displaced fracture or dislocation; if there is persistent concern for radiographically occult trauma or osteomyelitis, MRI with contrast would be recommended.   Original Report Authenticated By: Conchita Paris, M.D.    Dg Foot 2 Views Right 08/05/2012  *RADIOLOGY REPORT*  Clinical Data: Foot pain and soft tissue ulceration  RIGHT FOOT - 2 VIEW  Comparison: None.  Findings: Degenerative changes are noted at the first metatarsal phalangeal joint and first interphalangeal joint. Bones are osteopenic, which may mask subtle fracture.  Examination is suboptimal due to two-view technique and positioning.  There is cortical irregularity at the base of the proximal phalanx of the great toe, best seen on the lateral projection.  No radiopaque foreign body.  No gross evidence for fracture or dislocation.  IMPRESSION: Suboptimal exam as above and with osteopenia that may decrease sensitivity for detection of fracture.  Cortical irregularity at the base of the proximal phalanx of the great toe.  This could be remote, however  chronic osteomyelitis could have a similar appearance.  Consider MRI with contrast for further evaluation.   Original Report Authenticated By: Conchita Paris, M.D.      [phone]:  Will obtain BC x2 and left foot ulcer wound culture; then start IV vancomycin.  Dx and testing d/w pt  and family.  Questions answered.  Verb understanding, agreeable to admit.  T/C to [facility] Dr. Dyann Kief, case discussed, including:  HPI, pertinent PM/SHx, VS/PE, dx testing, ED course and treatment:  Agreeable to inpt admit, requests to write temporary orders, obtain medical bed to team 6.       Alfonzo Feller, DO 08/07/12 1226

## 2012-08-05 NOTE — ED Notes (Signed)
Admitting MD at the bedside.  

## 2012-08-05 NOTE — Progress Notes (Addendum)
ANTIBIOTIC CONSULT NOTE - INITIAL  Pharmacy Consult for Vancomycin and Zosyn Indication: Bilateral lower extremity ulcers   No Known Allergies  Patient Measurements:   Stated wt: 250 lb (114 kg) Stated ht: 70 in   Vital Signs: Temp: 97.5 F (36.4 C) (04/10 1244) Temp src: Oral (04/10 1244) BP: 142/80 mmHg (04/10 1525) Pulse Rate: 86 (04/10 1525) Intake/Output from previous day:   Intake/Output from this shift:    Labs:  Recent Labs  08/05/12 1246  WBC 5.3  HGB 12.1*  PLT 304  CREATININE 1.39*   CrCl is unknown because there is no height on file for the current visit. No results found for this basename: VANCOTROUGH, VANCOPEAK, VANCORANDOM, GENTTROUGH, GENTPEAK, GENTRANDOM, TOBRATROUGH, TOBRAPEAK, TOBRARND, AMIKACINPEAK, AMIKACINTROU, AMIKACIN,  in the last 72 hours   Microbiology: No results found for this or any previous visit (from the past 720 hour(s)).  Medical History: Past Medical History  Diagnosis Date  . Hypertension     Medications:  Home: allopurinol, clobetasol cream, cutivate cream, indomethacin prn, losartan, tramadol verapamil  Assessment: 79 y.o. male presents with worsening bilateral lower leg ulcerations with purulent drainage. To begin vancomycin and zosyn. Bld and wound cx pending. Estimated CrCl ~45 ml/min.  Goal of Therapy:  Vancomycin trough level 10-15 mcg/ml  Plan:  1. Vancomycin 2gm IV now then 1500mg  IV q24h. 2. Zosyn 3.375gm IV q8h. Each dose to be infused over 4 hours 3. Will f/u accurate wt, renal function, and cultures  Sherlon Handing, PharmD, BCPS Clinical pharmacist, pager [phone] 08/05/2012,3:52 PM

## 2012-08-06 DIAGNOSIS — E119 Type 2 diabetes mellitus without complications: Secondary | ICD-10-CM

## 2012-08-06 DIAGNOSIS — I059 Rheumatic mitral valve disease, unspecified: Secondary | ICD-10-CM

## 2012-08-06 DIAGNOSIS — I889 Nonspecific lymphadenitis, unspecified: Secondary | ICD-10-CM

## 2012-08-06 DIAGNOSIS — L97509 Non-pressure chronic ulcer of other part of unspecified foot with unspecified severity: Secondary | ICD-10-CM

## 2012-08-06 LAB — URINALYSIS, ROUTINE W REFLEX MICROSCOPIC
Hgb urine dipstick: NEGATIVE
Nitrite: NEGATIVE
Specific Gravity, Urine: 1.022 (ref 1.005–1.030)
Urobilinogen, UA: 2 mg/dL — ABNORMAL HIGH (ref 0.0–1.0)

## 2012-08-06 LAB — CBC
Hemoglobin: 9.8 g/dL — ABNORMAL LOW (ref 13.0–17.0)
MCH: 30.4 pg (ref 26.0–34.0)
MCHC: 33.4 g/dL (ref 30.0–36.0)

## 2012-08-06 LAB — BASIC METABOLIC PANEL
BUN: 13 mg/dL (ref 6–23)
Calcium: 8.7 mg/dL (ref 8.4–10.5)
GFR calc non Af Amer: 40 mL/min — ABNORMAL LOW (ref >90)
Glucose, Bld: 123 mg/dL — ABNORMAL HIGH (ref 70–99)
Sodium: 136 mEq/L (ref 135–145)

## 2012-08-06 LAB — HEMOGLOBIN A1C
Hgb A1c MFr Bld: 6.9 % — ABNORMAL HIGH (ref <5.7)
Mean Plasma Glucose: 151 mg/dL — ABNORMAL HIGH (ref <117)

## 2012-08-06 MED ORDER — INSULIN ASPART 100 UNIT/ML ~~LOC~~ SOLN: 0.0000 [IU] | Freq: Every day | SUBCUTANEOUS | Status: DC

## 2012-08-06 MED ORDER — INSULIN ASPART 100 UNIT/ML ~~LOC~~ SOLN
0.0000 [IU] | Freq: Three times a day (TID) | SUBCUTANEOUS | Status: DC
  Administered 2012-08-08: 1 [IU] via SUBCUTANEOUS
  Administered 2012-08-08: 2 [IU] via SUBCUTANEOUS
  Administered 2012-08-09 – 2012-08-12 (×5): 1 [IU] via SUBCUTANEOUS

## 2012-08-06 MED ORDER — VERAPAMIL HCL ER 180 MG PO TBCR
180.0000 mg | EXTENDED_RELEASE_TABLET | Freq: Every day | ORAL | Status: DC
  Administered 2012-08-07 – 2012-08-13 (×7): 180 mg via ORAL
  Filled 2012-08-06 (×7): qty 1

## 2012-08-06 NOTE — Progress Notes (Signed)
VASCULAR LAB PRELIMINARY  ARTERIAL  ABI completed:  No signals found at the foot.  Thickness of skin may adversely affect results.  Bilateral toe pressures indicate adequate perfusion for healing, left greater than right.    RIGHT    LEFT    PRESSURE WAVEFORM  PRESSURE WAVEFORM  BRACHIAL 111 Triphasic  BRACHIAL 109 Triphasic   DP  absent DP  absent  AT   AT    PT  absent PT  absent  PER   PER    GREAT TOE 40 dampened GREAT TOE 70 dampened    RIGHT LEFT  ABI NA NA      Lerone Onder, RVT 08/06/2012, 1:55 PM

## 2012-08-06 NOTE — Progress Notes (Signed)
Inpatient Diabetes Program Recommendations  AACE/ADA: New Consensus Statement on Inpatient Glycemic Control (2013)  Target Ranges:  Prepandial:   less than 140 mg/dL      Peak postprandial:   less than 180 mg/dL (1-2 hours)      Critically ill patients:  140 - 180 mg/dL   Reason for Visit: Elevated Hbg A1C.  No history of diabetes.  Note: Patient's Hgb A1C is 6.9.  (Indicating mean glucose of 151 mg/dl).  This value can be diagnostic for diabetes.    If MD diagnoses patient with diabetes:  Complete the Glycemic Control Order Set-- order CBG ac & hs, CHO Mod diet, etc  Add the diagnosis of diabetes to the hospital problem list  Alert nursing to do the Core Measure Pneumonia Vaccine screen   Request dietitian consult indicating new diagnosis  Order for nursing staff to provide basic diabetes survival skill education  Consider OP diabetes education follow-up-- given advanced age, may need HH-RN  Thank you.  [name] [name] S. Marcelline Mates, RN, CNS, CDE Inpatient Diabetes Program, team pager [phone]

## 2012-08-06 NOTE — Progress Notes (Signed)
[facility] PROGRESS NOTE  [name] [name] [id number] DOB: May 21, 1926 DOA: 08/05/2012 PCP: No PCP Per Patient  Assessment/Plan: 1-Bilateral LE lymphadenitis/abscess/cellulitis: concern for long term infection of his skin. Changes and discharge demonstrating infection present for many months.  -follow cx's and continue empiric broad-spectrum antibiotic therapy  -Follow wound care service rec's -follow ABI  -x-ray results not demonstrating osteomyelitis; but unfortunately not ruling it out either. Will improved renal function and ordered MRI for better evaluation and extension of his lesions if he failed to respond to treatment.  -PRN analgesics  -PT for hydrotherapy  2-HTN (hypertension): will discontinue cozaar at this moment, given renal failure (acute on chronic). Will continue verapamil and bidil.  -following low sodium diet; further adjustments based on response to medications.  3-SOB: intermittent. OHS might be contributing; but given long-term HTN and bilateral leg swelling this could be due to CHF.  -BNP elevated at 654 -continue heart healthy diet -follow 2-D echo results   4-Renal insufficiency/failure: acute on chronic (baseline per labs was stage 2 in 2011); now GFR in the low 40's.  -will discontinue cozaar  -follow BMET and Cr trend -UA no showing UTI.  -will check renal US -could be just progression of hypertensive/diabetic nephropathy  5-Tobacco abuse: couseniling provided. Started on nicotine patch.   6-Gout: continue allopurinol; no acute flare  7-DM type 2: with A1C of 6.9; will start treatment with SSI while in the hospital and at discharge will use glipizide or gliburide. Metformin not first choice given renal failure. -started on low carb diet.  8-GERD: started on PPI   DVT: heparin.   Code Status: Full Family Communication: no family at bedside; care discussed with patient. Disposition Plan: might need SNF for further treatment of his wounds; prior  to admission living home alone and w/o much support.   Consultants:  PT  Wound care service  Procedures:  See below for x-ray reports  Hydrotherapy by PT  Antibiotics:  vanc and zosyn  HPI/Subjective: Afebrile; feeling better overall. No CP, no SOB, no abdominal pain or any other complaints.  Objective: Filed Vitals:   08/05/12 2201 08/06/12 0546 08/06/12 0902 08/06/12 1100  BP: 153/98 109/43 99/53   Pulse: 86 75    Temp: 97.6 F (36.4 C) 98.3 F (36.8 C)    TempSrc: Oral Oral    Resp: 16 18    Height:    5\' 10"  (1.778 m)  Weight:    113.399 kg (250 lb)  SpO2: 97% 98%      Intake/Output Summary (Last 24 hours) at 08/06/12 2051 Last data filed at 08/06/12 1700  Gross per 24 hour  Intake    960 ml  Output    950 ml  Net     10 ml   Filed Weights   08/06/12 1100  Weight: 113.399 kg (250 lb)    Exam:   General:  Afebrile; NAD, reports pain on his LE is tolerable with available pain meds  Cardiovascular: S1 and S2, no rubs or gallops  Respiratory: CTA bilaterally  Abdomen: soft, NT, ND, positive BS  Musculoskeletal and skin:bilateral LE edema/induration 2-3++, positive left foot open ulcer and multiple bilateral malodorous open wounds/small abscess.  Data Reviewed: Basic Metabolic Panel:  Recent Labs Lab 08/05/12 1246 08/05/12 2031 08/06/12 0650  NA 140  --  136  K 4.1  --  3.8  CL 102  --  101  CO2 28  --  27  GLUCOSE 137*  --  123*  BUN  12  --  13  CREATININE 1.39* 1.36* 1.54*  CALCIUM 9.8  --  8.7  MG  --  2.1  --    Liver Function Tests:  Recent Labs Lab 08/05/12 1246  AST 25  ALT 8  ALKPHOS 49  BILITOT 0.4  PROT 8.1  ALBUMIN 3.1*   CBC:  Recent Labs Lab 08/05/12 1246 08/05/12 2031 08/06/12 0650  WBC 5.3 6.7 6.4  NEUTROABS 3.6  --   --   HGB 12.1* 11.0* 9.8*  HCT 35.4* 32.8* 29.3*  MCV 92.4 91.4 91.0  PLT 304 284 260   BNP (last 3 results)  Recent Labs  08/05/12 1627  PROBNP 654.3*    Recent Results (from  the past 240 hour(s))  CULTURE, BLOOD (ROUTINE X 2)     Status: None   Collection Time    08/05/12  3:45 PM      Result Value Range Status   Specimen Description BLOOD LEFT ANTECUBITAL   Final   Special Requests BOTTLES DRAWN AEROBIC AND ANAEROBIC 10CC   Final   Culture  Setup Time 08/05/2012 23:53   Final   Culture     Final   Value:        BLOOD CULTURE RECEIVED NO GROWTH TO DATE CULTURE WILL BE HELD FOR 5 DAYS BEFORE ISSUING A FINAL NEGATIVE REPORT   Report Status PENDING   Incomplete  CULTURE, BLOOD (ROUTINE X 2)     Status: None   Collection Time    08/05/12  3:55 PM      Result Value Range Status   Specimen Description BLOOD HAND LEFT   Final   Special Requests BOTTLES DRAWN AEROBIC ONLY 10CC   Final   Culture  Setup Time 08/05/2012 23:52   Final   Culture     Final   Value:        BLOOD CULTURE RECEIVED NO GROWTH TO DATE CULTURE WILL BE HELD FOR 5 DAYS BEFORE ISSUING A FINAL NEGATIVE REPORT   Report Status PENDING   Incomplete  WOUND CULTURE     Status: None   Collection Time    08/05/12  4:18 PM      Result Value Range Status   Specimen Description WOUND FOOT LEFT   Final   Special Requests NONE   Final   Gram Stain     Final   Value: ABUNDANT WBC PRESENT, PREDOMINANTLY PMN     NO SQUAMOUS EPITHELIAL CELLS SEEN     ABUNDANT GRAM NEGATIVE RODS     ABUNDANT GRAM POSITIVE COCCI     IN PAIRS   Culture Culture reincubated for better growth   Final   Report Status PENDING   Incomplete     Studies: Dg Tibia/fibula Left  08/05/2012  *RADIOLOGY REPORT*  Clinical Data: Bilateral lower leg pain and skin ulcerations.  LEFT TIBIA AND FIBULA - 2 VIEW  Comparison: None.  Findings: Skin irregularity consistent with ulcerations is noted. No soft tissue gas collection, radiopaque foreign body or bony destructive change is identified.  There is no knee joint effusion. 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 There appears to be an erosion of the first  tarsometatarsal joint suggestive of gout.  IMPRESSION:  1.  Skin ulcerations without underlying acute bony or joint abnormality. 2.  Tibiotalar degenerative change. 3.  Question gout of the first tarsometatarsal joint.   Original Report Authenticated By: Orlean Patten, M.D.    Dg Tibia/fibula Right  08/05/2012  **ADDENDUM** CREATED: 08/05/2012  15:30:31  This addendum is given for the purpose of noting the patient has marked appearing tibiotalar degenerative disease.  **END ADDENDUM** SIGNED BY: Marcello Moores L. Luther Parody, M.D.   08/05/2012  *RADIOLOGY REPORT*  Clinical Data: Bilateral lower extremity skin ulcerations.  RIGHT TIBIA AND FIBULA - 2 VIEW  Comparison: None.  Findings: Irregularity of cutaneous tissues is consistent with multiple skin ulcerations.  No radiopaque foreign body or soft tissue gas collection is identified.  No bony destructive change to suggest osteomyelitis is seen.  There is no fracture.  IMPRESSION: Multiple skin ulceration without underlying evidence of osteomyelitis.   Original Report Authenticated By: Orlean Patten, M.D.    Dg Foot 2 Views Left  08/05/2012  *RADIOLOGY REPORT*  Clinical Data: Foot pain with soft tissue ulceration  LEFT FOOT - 2 VIEW  Comparison: None.  Findings: Bones are osteopenic, which may mask subtle fracture. Allowing for two-view technique, there is no displaced fracture or dislocation.  No radiopaque foreign body.  IMPRESSION: Bones are osteopenic, which may mask subtle fracture.  No displaced fracture or dislocation; if there is persistent concern for radiographically occult trauma or osteomyelitis, MRI with contrast would be recommended.   Original Report Authenticated By: Conchita Paris, M.D.    Dg Foot 2 Views Right  08/05/2012  *RADIOLOGY REPORT*  Clinical Data: Foot pain and soft tissue ulceration  RIGHT FOOT - 2 VIEW  Comparison: None.  Findings: Degenerative changes are noted at the first metatarsal phalangeal joint and first interphalangeal  joint. Bones are osteopenic, which may mask subtle fracture.  Examination is suboptimal due to two-view technique and positioning.  There is cortical irregularity at the base of the proximal phalanx of the great toe, best seen on the lateral projection.  No radiopaque foreign body.  No gross evidence for fracture or dislocation.  IMPRESSION: Suboptimal exam as above and with osteopenia that may decrease sensitivity for detection of fracture.  Cortical irregularity at the base of the proximal phalanx of the great toe.  This could be remote, however chronic osteomyelitis could have a similar appearance.  Consider MRI with contrast for further evaluation.   Original Report Authenticated By: Conchita Paris, M.D.     Scheduled Meds: . allopurinol  300 mg Oral Daily  . aspirin EC  81 mg Oral Daily  . heparin  5,000 Units Subcutaneous Q8H  . insulin aspart  0-5 Units Subcutaneous QHS  . [START ON 08/07/2012] insulin aspart  0-9 Units Subcutaneous TID WC  . isosorbide-hydrALAZINE  1 tablet Oral BID  . nicotine  14 mg Transdermal Daily  . pantoprazole  40 mg Oral Q1200  . piperacillin-tazobactam (ZOSYN)  IV  3.375 g Intravenous Q8H  . vancomycin  1,500 mg Intravenous Q24H  . [START ON 08/07/2012] verapamil  180 mg Oral Daily   Continuous Infusions:   Principal Problem:   Erysipelas of lower extremity Active Problems:   HTN (hypertension)   Renal insufficiency   Tobacco abuse   Gout   Hyperglycemia    Time spent: >30 minutes    [name] [name]  [facility] Pager [phone]. If 7PM-7AM, please contact night-coverage at [email or web address], password [facility] 08/06/2012, 8:51 PM  LOS: 1 day

## 2012-08-06 NOTE — Consult Note (Signed)
WOC consult Note Reason for Consult: evaluation of LE wounds, skin issues. Pt appears to have lymphedema with abscess under the areas of brawny edema. He has open wound on the dorsal left foot that he reports is from where he "scalded it with hot water".  Upon inspection he has multiple areas that I was able to rupture and drain purulent, malodorous drainage from.  He will need longterm follow up for management of his legs with lymphedema tx (available at [place], or [facility] outpatient PT department) Reports back of the legs are scarred from use of "heating pad".  Has used cortisone creams in the past which did improve some areas but the areas just got too large to use the cream on.  Wound type:skin abscess from lymphedema  Pressure Ulcer POA: No Measurement: multiple small abcess sites, open wound of the left dorsal foot: 2.0cm x 2.0cm x 0.5cm Wound [id number] dorsal foot 100% yellow slough Drainage (amount, consistency, odor) purulent, with odor, yellow-brown Dressing procedure/placement/frequency: Discussed patient with PT, I have had some success with similar presentation of lymphedema lesions by using hydrotherapy to clean the legs/abcess sites, will order silver hydrofiber to be placed at the open wounds to add antimicrobial wicking effects.  Wrap in kerlix and [place] after hydrotherapy.   WOC will follow along with you for LE evaluations.   Markail Diekman Liane Comber RN, CWOCN  Conservative sharp wound debridement (CSWD performed at the bedside:

## 2012-08-06 NOTE — Progress Notes (Signed)
  Echocardiogram 2D Echocardiogram has been performed.  Tysheka Fanguy FRANCES 08/06/2012, 6:20 PM

## 2012-08-06 NOTE — Progress Notes (Signed)
Physical Therapy Wound Treatment Patient Details  Name: [name] [name] MRN: [id number] Date of Birth: Sep 14, 1926  Today's Date: 08/06/2012 Time: [id number] Time Calculation (min): 37 min  Subjective  Subjective: I just got to where I couldn't take care of these legs Patient and Family Stated Goals: to save my legs Prior Treatments: soaked his legs at home; hydrocortisone cream  Pain Score: Pain Score:   5  08/06/12 1524  Subjective Assessment  Subjective I just got to where I couldn't take care of these legs  Patient and Family Stated Goals to save my legs  Prior Treatments soaked his legs at home; hydrocortisone cream  Evaluation and Treatment  Evaluation and Treatment Procedures Explained to Patient/Family Yes  Evaluation and Treatment Procedures agreed to  Wound 08/05/12 Other (Comment) Foot Left area has clear drainage.   Date First Assessed/Time First Assessed: 08/05/12 1730   Wound Type: (c) Other (Comment)  Location: Foot  Location Orientation: Left  Wound Description (Comments): area has clear drainage.   Present on Admission: Yes  Site / Wound Assessment Black;Brown;Granulation tissue;Pale;Red;Yellow  % Wound base Red or Granulating 10%  % Wound base Yellow 70%  % Wound base Black 20%  Peri-wound Assessment Intact;Black;Induration;Other (Comment) (dry skin)  Wound Length (cm) 2.3 cm  Wound Width (cm) 2.5 cm  Wound Depth (cm) 0.7 cm  Margins Unattacted edges (unapproximated)  Closure None  Drainage Amount Minimal  Drainage Description Purulent;Odor  Non-staged Wound Description Full thickness  Treatment Hydrotherapy (Pulse lavage);Debridement (Selective);Other (Comment) (dressings supplies not on unit; RN to dress when arrive)  Dressing Type None  Dressing Changed Other (Comment) (RN to apply when arrives)  Wound 08/06/12 posterior Right lower leg  Date First Assessed/Time First Assessed: 08/06/12 1537   Wound Description (Comments): posterior Right lower leg  Present on  Admission: Yes  Site / Wound Assessment Pink;Red  % Wound base Red or Granulating 100%  Peri-wound Assessment Denuded;Maceration  Wound Length (cm) 22.2 cm (multiple open areas within this area)  Wound Width (cm) 9.8 cm  Wound Depth (cm) 0 cm  Margins Unattacted edges (unapproximated)  Closure None  Drainage Amount Minimal  Drainage Description Serosanguineous  Non-staged Wound Description Partial thickness  Treatment Hydrotherapy (Pulse lavage)  Dressing Type Other (Comment) (RN to apply when it arrives)  Wound 08/06/12 Rt medial lower leg  Date First Assessed/Time First Assessed: 08/06/12 1540   Wound Description (Comments): Rt medial lower leg  Present on Admission: Yes  Site / Wound Assessment Pale;Red;Yellow  % Wound base Red or Granulating 75%  % Wound base Yellow 25%  Peri-wound Assessment Induration;Maceration  Wound Length (cm) 2.2 cm  Wound Width (cm) 2.7 cm  Wound Depth (cm) 0 cm  Margins Unattacted edges (unapproximated)  Closure None  Drainage Amount Scant  Drainage Description Purulent;Odor  Non-staged Wound Description Partial thickness  Treatment Hydrotherapy (Pulse lavage)  Dressing Type Other (Comment) (RN to apply when arrives)  Wound 08/06/12 Left lateral lower leg  Date First Assessed/Time First Assessed: 08/06/12 1542   Wound Description (Comments): Left lateral lower leg  Present on Admission: Yes  Site / Wound Assessment Pink;Red  % Wound base Red or Granulating 100%  Peri-wound Assessment Maceration;Induration  Wound Length (cm) 0.1 cm  Wound Width (cm) 0.5 cm  Wound Depth (cm) 0 cm  Margins Unattacted edges (unapproximated)  Closure None  Drainage Amount Scant  Drainage Description Purulent;Odor  Non-staged Wound Description Partial thickness  Treatment Hydrotherapy (Pulse lavage)  Dressing Type Other (Comment) (RN to apply  when arrives)  Hydrotherapy  Pulsed Lavage with Suction (psi) (4-8 psi)  Pulsed Lavage with Suction - Normal Saline  Used 1000 mL  Pulsed Lavage Tip Tip with splash shield  Pulsed lavage therapy - wound location Rt posterior lower leg, Rt medial lower leg, Lt lateral lower leg, Lt foot dorsum  Selective Debridement  Selective Debridement - Location Lt foot  Selective Debridement - Tools Used Forceps;Scissors  Selective Debridement - Tissue Removed yellow and black eschar  Wound Therapy - Assess/Plan/Recommendations  Wound Therapy - Clinical Statement pt admitted with infections to open areas on bil LEs. Pt with chronic edema bil LEs. Currently with several ares of various sizes where pus can be expressed. Also with burn to dorsum of Lt foot with eschar. Anticipate Lt foot wound (and potentially other open areas on bil legs) will benefit from hydrotherapy to decr bacterial load and necrotic tissue to promote healing. Trial of hyddrotherapy x 7 days.  Wound Therapy - Functional Problem List limited mobility due to edematous, weeping legs  Factors Delaying/Impairing Wound Healing Diabetes Mellitus;Infection - systemic/local;Immobility;Multiple medical problems  Hydrotherapy Plan Debridement;Patient/family education;Pulsatile lavage with suction  Wound Therapy - Frequency 6X / week  Wound Therapy - Follow Up Recommendations Skilled nursing facility  Wound Therapy Goals - Improve the function of patient's integumentary system by progressing the wound(s) through the phases of wound healing by:  Decrease Necrotic Tissue to left foot <50%  Decrease Necrotic Tissue - Progress Goal set today  Increase Granulation Tissue to Lt foot to >50%  Increase Granulation Tissue - Progress Goal set today  Improve Drainage Characteristics Min  Improve Drainage Characteristics - Progress Goal set today  Patient/Family will be able to  verbalize understanding of ways he may contribute to his legs healing (elevation, nutrition, pressure relief).  Patient/Family Instruction Goal - Progress Goal set today  Goals/treatment  plan/discharge plan were made with and agreed upon by patient/family Yes  Time For Goal Achievement 7 days  Wound Therapy - Potential for Goals Good    08/06/2012 [name], PT Pager: [phone]

## 2012-08-07 ENCOUNTER — Inpatient Hospital Stay (HOSPITAL_COMMUNITY): Payer: Medicare Other

## 2012-08-07 DIAGNOSIS — L0390 Cellulitis, unspecified: Secondary | ICD-10-CM | POA: Diagnosis present

## 2012-08-07 DIAGNOSIS — I889 Nonspecific lymphadenitis, unspecified: Secondary | ICD-10-CM | POA: Diagnosis present

## 2012-08-07 LAB — GLUCOSE, CAPILLARY: Glucose-Capillary: 108 mg/dL — ABNORMAL HIGH (ref 70–99)

## 2012-08-07 LAB — BASIC METABOLIC PANEL
BUN: 13 mg/dL (ref 6–23)
CO2: 25 mEq/L (ref 19–32)
Chloride: 101 mEq/L (ref 96–112)
Glucose, Bld: 107 mg/dL — ABNORMAL HIGH (ref 70–99)
Potassium: 3.9 mEq/L (ref 3.5–5.1)

## 2012-08-07 NOTE — Progress Notes (Signed)
Physical Therapy Hydrotherapy Note   08/07/12 0911  Subjective Assessment  Subjective No pain right now  Prior Treatments soaked his legs at home; hydrocortisone cream  Evaluation and Treatment  Evaluation and Treatment Procedures Explained to Patient/Family Yes  Evaluation and Treatment Procedures agreed to  Wound 08/05/12 Other (Comment) Foot Left area has clear drainage.   Date First Assessed/Time First Assessed: 08/05/12 1730   Wound Type: (c) Other (Comment)  Location: Foot  Location Orientation: Left  Wound Description (Comments): area has clear drainage.   Present on Admission: Yes  Site / Wound Assessment Black;Brown;Granulation tissue;Pale;Red;Yellow  % Wound base Red or Granulating 10%  % Wound base Yellow 70%  % Wound base Black 20%  Peri-wound Assessment Intact;Black;Induration;Other (Comment) (dry skin)  Margins Unattacted edges (unapproximated)  Closure None  Drainage Amount Minimal  Drainage Description Purulent;Odor  Non-staged Wound Description Full thickness  Treatment Hydrotherapy (Pulse lavage);Debridement (Selective) (Dressing - hydrocel; kerlix; coban)  Dressing Type Silver hydrofiber;Gauze (Comment);Compression wrap  Dressing Changed Changed  Wound 08/06/12 posterior Right lower leg  Date First Assessed/Time First Assessed: 08/06/12 1537   Wound Description (Comments): posterior Right lower leg  Present on Admission: Yes  Site / Wound Assessment Pink;Red  % Wound base Red or Granulating 100%  Peri-wound Assessment Denuded;Maceration  Margins Unattacted edges (unapproximated)  Closure None  Drainage Amount Minimal  Drainage Description Serosanguineous  Non-staged Wound Description Partial thickness  Treatment Hydrotherapy (Pulse lavage)  Dressing Type Silver hydrofiber;Gauze (Comment);Compression wrap  Dressing Changed Changed  Wound 08/06/12 Rt medial lower leg  Date First Assessed/Time First Assessed: 08/06/12 1540   Wound Description (Comments): Rt  medial lower leg  Present on Admission: Yes  Site / Wound Assessment Pale;Red;Yellow  % Wound base Red or Granulating 75%  % Wound base Yellow 25%  Peri-wound Assessment Induration;Maceration  Margins Unattacted edges (unapproximated)  Closure None  Drainage Amount Scant  Drainage Description Purulent;Odor  Non-staged Wound Description Partial thickness  Treatment Hydrotherapy (Pulse lavage)  Dressing Type Silver hydrofiber;Gauze (Comment);Compression wrap  Dressing Changed Changed  Wound 08/06/12 Left lateral lower leg  Date First Assessed/Time First Assessed: 08/06/12 1542   Wound Description (Comments): Left lateral lower leg  Present on Admission: Yes  Site / Wound Assessment Pink;Red  % Wound base Red or Granulating 100%  Peri-wound Assessment Maceration;Induration  Margins Unattacted edges (unapproximated)  Closure None  Drainage Amount Scant  Drainage Description Purulent;Odor  Non-staged Wound Description Partial thickness  Treatment Hydrotherapy (Pulse lavage)  Dressing Type Silver hydrofiber;Gauze (Comment);Compression wrap  Dressing Changed Changed  Hydrotherapy  Pulsed Lavage with Suction (psi) (4-8 psi)  Pulsed Lavage with Suction - Normal Saline Used 1000 mL  Pulsed Lavage Tip Tip with splash shield  Pulsed lavage therapy - wound location Rt posterior lower leg, Rt medial lower leg, Lt lateral lower leg, Lt foot dorsum  Selective Debridement  Selective Debridement - Location Lt foot  Selective Debridement - Tools Used Forceps;Scissors  Selective Debridement - Tissue Removed yellow and black eschar  Wound Therapy - Assess/Plan/Recommendations  Wound Therapy - Clinical Statement Wounds with minimal drainage with odor present.  Hydrotherapy Plan Debridement;Patient/family education;Pulsatile lavage with suction  Wound Therapy - Frequency 6X / week  Wound Therapy - Follow Up Recommendations Skilled nursing facility  Wound Therapy Goals - Improve the function of  patient's integumentary system by progressing the wound(s) through the phases of wound healing by:  Decrease Necrotic Tissue to left foot <50%  Decrease Necrotic Tissue - Progress Progressing toward goal  Increase  Granulation Tissue to Lt foot to >50%  Increase Granulation Tissue - Progress Progressing toward goal  Improve Drainage Characteristics Min  Improve Drainage Characteristics - Progress Progressing toward goal  Patient/Family will be able to  verbalize understanding of ways he may contribute to his legs healing (elevation, nutrition, pressure relief).  Patient/Family Instruction Goal - Progress Progressing toward goal  Carita Pian. Sanjuana Kava, [place] Pager [phone]

## 2012-08-07 NOTE — Progress Notes (Signed)
[facility] PROGRESS NOTE  [name] [name] [id number] DOB: 10-Jun-1926 DOA: 08/05/2012 PCP: No PCP Per Patient  Assessment/Plan: 1-Bilateral LE lymphadenitis/abscess/cellulitis: concern for long term infection of his skin. Changes and discharge demonstrating infection present for many months.  -follow cx's and continue empiric broad-spectrum antibiotic therapy  -Follow wound care service rec's -ABI difficult to interpreted due to ongoing swelling; but signal on his toes guarantee good perfusion for healing.  -x-ray results not demonstrating osteomyelitis; but unfortunately not ruling it out either. Will improved renal function and ordered MRI for better evaluation and extension of his lesions if he failed to respond to treatment.  -PRN analgesics  -PT for hydrotherapy  2-HTN (hypertension): will discontinue cozaar at this moment, given renal failure (acute on chronic). Will continue verapamil and bidil.  -following low sodium diet; further adjustments based on response to medications.  3-SOB: intermittent. OHS might be contributing; but given long-term HTN and bilateral leg swelling this could be due to CHF.  -BNP elevated at 654 -continue heart healthy diet -2-D echo results demonstrating no wall motion abnormalities, preserved EF and grade 2 diastolic dysfunction. -reports SOB is better.   4-Renal insufficiency/failure: acute on chronic (baseline per labs was stage 2 in 2011); now GFR in the low 40's.  -will discontinue cozaar  -follow BMET and Cr trend -UA no showing UTI.  -will check renal US -could be just progression of hypertensive/diabetic nephropathy -Vancomycin potentially also contributing; pharmacy to adjust dose.  5-Tobacco abuse: couseniling provided. Started on nicotine patch.   6-Gout: continue allopurinol; no acute flare  7-DM type 2: with A1C of 6.9; will start treatment with SSI while in the hospital and at discharge will use glipizide or gliburide. Metformin  not first choice given renal failure. -started on low carb diet.  8-GERD: started on PPI   9-Grade 2 diastolic dysfunction: no SOB. -continue close Intake and output and daily weight. -low sodium diet -holding diuretics at this point as no orthopnea or significant SOB. Also due to renal failure. -control HTN.  DVT: heparin.   Code Status: Full Family Communication: no family at bedside; care discussed with patient. Disposition Plan: might need SNF for further treatment of his wounds; prior to admission living home alone and w/o much support.   Consultants:  PT  Wound care service  Procedures:  See below for x-ray reports  Hydrotherapy by PT  Antibiotics:  vanc and zosyn  HPI/Subjective: Afebrile; feeling better overall. No CP, no SOB, no abdominal pain or any other complaints. LE less swollen and with pressure wrapping in place.  Objective: Filed Vitals:   08/06/12 0902 08/06/12 1100 08/06/12 2141 08/07/12 0637  BP: 99/53  132/61 124/55  Pulse:   89 94  Temp:   98 F (36.7 C) 98.9 F (37.2 C)  TempSrc:      Resp:   18 18  Height:  5\' 10"  (1.778 m)    Weight:  113.399 kg (250 lb)    SpO2:   100% 98%    Intake/Output Summary (Last 24 hours) at 08/07/12 1408 Last data filed at 08/07/12 0542  Gross per 24 hour  Intake   1300 ml  Output    725 ml  Net    575 ml   Filed Weights   08/06/12 1100  Weight: 113.399 kg (250 lb)    Exam:   General:  Afebrile; NAD, reports pain on his LE is tolerable with available pain meds  Cardiovascular: S1 and S2, no rubs or gallops  Respiratory: CTA bilaterally  Abdomen: soft, NT, ND, positive BS  Musculoskeletal and skin:bilateral LE edema/induration/erythema; also with positive left foot open ulcer and multiple bilateral malodorous open wounds/small abscess.  Data Reviewed: Basic Metabolic Panel:  Recent Labs Lab 08/05/12 1246 08/05/12 2031 08/06/12 0650 08/07/12 0725  NA 140  --  136 135  K 4.1  --   3.8 3.9  CL 102  --  101 101  CO2 28  --  27 25  GLUCOSE 137*  --  123* 107*  BUN 12  --  13 13  CREATININE 1.39* 1.36* 1.54* 1.61*  CALCIUM 9.8  --  8.7 8.7  MG  --  2.1  --   --    Liver Function Tests:  Recent Labs Lab 08/05/12 1246  AST 25  ALT 8  ALKPHOS 49  BILITOT 0.4  PROT 8.1  ALBUMIN 3.1*   CBC:  Recent Labs Lab 08/05/12 1246 08/05/12 2031 08/06/12 0650  WBC 5.3 6.7 6.4  NEUTROABS 3.6  --   --   HGB 12.1* 11.0* 9.8*  HCT 35.4* 32.8* 29.3*  MCV 92.4 91.4 91.0  PLT 304 284 260   BNP (last 3 results)  Recent Labs  08/05/12 1627  PROBNP 654.3*    Recent Results (from the past 240 hour(s))  CULTURE, BLOOD (ROUTINE X 2)     Status: None   Collection Time    08/05/12  3:45 PM      Result Value Range Status   Specimen Description BLOOD LEFT ANTECUBITAL   Final   Special Requests BOTTLES DRAWN AEROBIC AND ANAEROBIC 10CC   Final   Culture  Setup Time 08/05/2012 23:53   Final   Culture     Final   Value:        BLOOD CULTURE RECEIVED NO GROWTH TO DATE CULTURE WILL BE HELD FOR 5 DAYS BEFORE ISSUING A FINAL NEGATIVE REPORT   Report Status PENDING   Incomplete  CULTURE, BLOOD (ROUTINE X 2)     Status: None   Collection Time    08/05/12  3:55 PM      Result Value Range Status   Specimen Description BLOOD HAND LEFT   Final   Special Requests BOTTLES DRAWN AEROBIC ONLY 10CC   Final   Culture  Setup Time 08/05/2012 23:52   Final   Culture     Final   Value:        BLOOD CULTURE RECEIVED NO GROWTH TO DATE CULTURE WILL BE HELD FOR 5 DAYS BEFORE ISSUING A FINAL NEGATIVE REPORT   Report Status PENDING   Incomplete  WOUND CULTURE     Status: None   Collection Time    08/05/12  4:18 PM      Result Value Range Status   Specimen Description WOUND FOOT LEFT   Final   Special Requests NONE   Final   Gram Stain     Final   Value: ABUNDANT WBC PRESENT, PREDOMINANTLY PMN     NO SQUAMOUS EPITHELIAL CELLS SEEN     ABUNDANT GRAM NEGATIVE RODS     ABUNDANT GRAM  POSITIVE COCCI     IN PAIRS   Culture Culture reincubated for better growth   Final   Report Status PENDING   Incomplete     Studies: Dg Tibia/fibula Left  08/05/2012  *RADIOLOGY REPORT*  Clinical Data: Bilateral lower leg pain and skin ulcerations.  LEFT TIBIA AND FIBULA - 2 VIEW  Comparison: None.  Findings: Skin irregularity  consistent with ulcerations is noted. No soft tissue gas collection, radiopaque foreign body or bony destructive change is identified.  There is no knee joint effusion. 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 There appears to be an erosion of the first tarsometatarsal joint suggestive of gout.  IMPRESSION:  1.  Skin ulcerations without underlying acute bony or joint abnormality. 2.  Tibiotalar degenerative change. 3.  Question gout of the first tarsometatarsal joint.   Original Report Authenticated By: Orlean Patten, M.D.    Dg Tibia/fibula Right  08/05/2012  **ADDENDUM** CREATED: 08/05/2012 15:30:31  This addendum is given for the purpose of noting the patient has marked appearing tibiotalar degenerative disease.  **END ADDENDUM** SIGNED BY: Marcello Moores L. Luther Parody, M.D.   08/05/2012  *RADIOLOGY REPORT*  Clinical Data: Bilateral lower extremity skin ulcerations.  RIGHT TIBIA AND FIBULA - 2 VIEW  Comparison: None.  Findings: Irregularity of cutaneous tissues is consistent with multiple skin ulcerations.  No radiopaque foreign body or soft tissue gas collection is identified.  No bony destructive change to suggest osteomyelitis is seen.  There is no fracture.  IMPRESSION: Multiple skin ulceration without underlying evidence of osteomyelitis.   Original Report Authenticated By: Orlean Patten, M.D.    Dg Foot 2 Views Left  08/05/2012  *RADIOLOGY REPORT*  Clinical Data: Foot pain with soft tissue ulceration  LEFT FOOT - 2 VIEW  Comparison: None.  Findings: Bones are osteopenic, which may mask subtle fracture. Allowing for two-view technique, there is no  displaced fracture or dislocation.  No radiopaque foreign body.  IMPRESSION: Bones are osteopenic, which may mask subtle fracture.  No displaced fracture or dislocation; if there is persistent concern for radiographically occult trauma or osteomyelitis, MRI with contrast would be recommended.   Original Report Authenticated By: Conchita Paris, M.D.    Dg Foot 2 Views Right  08/05/2012  *RADIOLOGY REPORT*  Clinical Data: Foot pain and soft tissue ulceration  RIGHT FOOT - 2 VIEW  Comparison: None.  Findings: Degenerative changes are noted at the first metatarsal phalangeal joint and first interphalangeal joint. Bones are osteopenic, which may mask subtle fracture.  Examination is suboptimal due to two-view technique and positioning.  There is cortical irregularity at the base of the proximal phalanx of the great toe, best seen on the lateral projection.  No radiopaque foreign body.  No gross evidence for fracture or dislocation.  IMPRESSION: Suboptimal exam as above and with osteopenia that may decrease sensitivity for detection of fracture.  Cortical irregularity at the base of the proximal phalanx of the great toe.  This could be remote, however chronic osteomyelitis could have a similar appearance.  Consider MRI with contrast for further evaluation.   Original Report Authenticated By: Conchita Paris, M.D.     Scheduled Meds: . allopurinol  300 mg Oral Daily  . aspirin EC  81 mg Oral Daily  . heparin  5,000 Units Subcutaneous Q8H  . insulin aspart  0-5 Units Subcutaneous QHS  . insulin aspart  0-9 Units Subcutaneous TID WC  . isosorbide-hydrALAZINE  1 tablet Oral BID  . nicotine  14 mg Transdermal Daily  . pantoprazole  40 mg Oral Q1200  . piperacillin-tazobactam (ZOSYN)  IV  3.375 g Intravenous Q8H  . vancomycin  1,500 mg Intravenous Q24H  . verapamil  180 mg Oral Daily   Continuous Infusions:   Principal Problem:   Lymphadenitis Active Problems:   HTN (hypertension)   Renal insufficiency    Tobacco abuse   Gout   Hyperglycemia  Cellulitis    Time spent: >30 minutes    Ryhanna Dunsmore  [facility] Pager [phone]. If 7PM-7AM, please contact night-coverage at [email or web address], password [facility] 08/07/2012, 2:08 PM  LOS: 2 days

## 2012-08-08 LAB — GLUCOSE, CAPILLARY: Glucose-Capillary: 154 mg/dL — ABNORMAL HIGH (ref 70–99)

## 2012-08-08 LAB — BASIC METABOLIC PANEL
CO2: 24 mEq/L (ref 19–32)
Chloride: 100 mEq/L (ref 96–112)
Sodium: 135 mEq/L (ref 135–145)

## 2012-08-08 MED ORDER — FUROSEMIDE 20 MG PO TABS
20.0000 mg | ORAL_TABLET | Freq: Every day | ORAL | Status: DC
  Administered 2012-08-08 – 2012-08-13 (×6): 20 mg via ORAL
  Filled 2012-08-08 (×6): qty 1

## 2012-08-08 NOTE — Progress Notes (Signed)
ANTIBIOTIC CONSULT NOTE - INITIAL  Pharmacy Consult for Vancomycin and Zosyn Indication: Bilateral lower extremity ulcers   No Known Allergies  Patient Measurements: Height: 5\' 10"  (177.8 cm) Weight: 250 lb (113.399 kg) IBW/kg (Calculated) : 73 Stated wt: 250 lb (114 kg) Stated ht: 70 in   Vital Signs: Temp: 99.6 F (37.6 C) (04/13 0653) Temp src: Oral (04/13 0653) BP: 133/68 mmHg (04/13 0653) Pulse Rate: 83 (04/13 0653) Intake/Output from previous day: 04/12 0701 - 04/13 0700 In: 840 [P.O.:840] Out: 1300 [Urine:1300] Intake/Output from this shift:    Labs:  Recent Labs  08/05/12 2031 08/06/12 0650 08/07/12 0725 08/08/12 0554  WBC 6.7 6.4  --   --   HGB 11.0* 9.8*  --   --   PLT 284 260  --   --   CREATININE 1.36* 1.54* 1.61* 1.55*   Estimated Creatinine Clearance: 45.6 ml/min (by C-G formula based on Cr of 1.55).  Recent Labs  08/08/12 1523  VANCOTROUGH 12.2     Microbiology: Recent Results (from the past 720 hour(s))  CULTURE, BLOOD (ROUTINE X 2)     Status: None   Collection Time    08/05/12  3:45 PM      Result Value Range Status   Specimen Description BLOOD LEFT ANTECUBITAL   Final   Special Requests BOTTLES DRAWN AEROBIC AND ANAEROBIC 10CC   Final   Culture  Setup Time 08/05/2012 23:53   Final   Culture     Final   Value:        BLOOD CULTURE RECEIVED NO GROWTH TO DATE CULTURE WILL BE HELD FOR 5 DAYS BEFORE ISSUING A FINAL NEGATIVE REPORT   Report Status PENDING   Incomplete  CULTURE, BLOOD (ROUTINE X 2)     Status: None   Collection Time    08/05/12  3:55 PM      Result Value Range Status   Specimen Description BLOOD HAND LEFT   Final   Special Requests BOTTLES DRAWN AEROBIC ONLY 10CC   Final   Culture  Setup Time 08/05/2012 23:52   Final   Culture     Final   Value:        BLOOD CULTURE RECEIVED NO GROWTH TO DATE CULTURE WILL BE HELD FOR 5 DAYS BEFORE ISSUING A FINAL NEGATIVE REPORT   Report Status PENDING   Incomplete  WOUND CULTURE      Status: None   Collection Time    08/05/12  4:18 PM      Result Value Range Status   Specimen Description WOUND FOOT LEFT   Final   Special Requests NONE   Final   Gram Stain     Final   Value: ABUNDANT WBC PRESENT, PREDOMINANTLY PMN     NO SQUAMOUS EPITHELIAL CELLS SEEN     ABUNDANT GRAM NEGATIVE RODS     ABUNDANT GRAM POSITIVE COCCI     IN PAIRS   Culture ABUNDANT PSEUDOMONAS AERUGINOSA   Final   Report Status PENDING   Incomplete    Medical History: Past Medical History  Diagnosis Date  . Hypertension     Medications:  Home: allopurinol, clobetasol cream, cutivate cream, indomethacin prn, losartan, tramadol verapamil  Assessment: 79 y.o. male presents with worsening bilateral lower leg ulcerations with purulent drainage. To begin vancomycin and zosyn. Bld and wound cx pending. Trough came back at 12 so with in goal.   Goal of Therapy:  Vancomycin trough level 10-15 mcg/ml  Plan:  1. Cont vanc  1500mg  IV q24h. 2. Zosyn 3.375gm IV q8h. Each dose to be infused over 4 hours

## 2012-08-08 NOTE — Progress Notes (Signed)
[facility] PROGRESS NOTE  [name] [name] [id number] DOB: [birth year] DOA: 08/05/2012 PCP: No PCP Per Patient  Assessment/Plan: 1-Bilateral LE lymphadenitis/abscess/cellulitis: concern for long term infection of his skin. Changes and discharge demonstrating infection present for many months.  -follow cx's and continue empiric broad-spectrum antibiotic therapy  -Follow wound care service rec's -ABI difficult to interpreted due to ongoing swelling; but signal on his toes guarantee good perfusion for healing.  -x-ray results not demonstrating osteomyelitis; but unfortunately not ruling it out either. Will improved renal function and ordered MRI for better evaluation and extension of his lesions if he failed to respond to treatment.  -PRN analgesics  -PT for hydrotherapy  2-HTN (hypertension): cozaar discontinued at this moment, given renal failure (acute on chronic). Will continue verapamil and bidil.  -following low sodium diet; further adjustments based on response to medications.  3-SOB: intermittent. OHS might be contributing. 2-D echo demonstrating grade 2 diastolic dysfunction. -BNP elevated at 654 -continue heart healthy diet -start low dose lasix   4-Renal insufficiency/failure: acute on chronic (baseline per labs was stage 2 in 2011); now GFR in the low 40's.  -follow BMET and Cr trend; slowly improving -UA no showing UTI and renal US WNL -could be just progression of hypertensive/diabetic nephropathy -Vancomycin potentially also contributing; pharmacy to adjust dose.  5-Tobacco abuse: couseniling provided. Started on nicotine patch.   6-Gout: continue allopurinol; no acute flare  7-DM type 2: with A1C of 6.9; will continue treatment with SSI while in the hospital and at discharge will use glipizide or gliburide. Metformin not first choice given renal failure. -continue low carb diet.  8-GERD: started on PPI   9-Grade 2 diastolic dysfunction: with mild SOB  overnight. -continue close Intake and output, and daily weight. -low sodium diet -low dose lasix to be started. -continue controlling HTN.  DVT: heparin.   Code Status: Full Family Communication: no family at bedside; care discussed with patient. Disposition Plan: might need SNF for further treatment of his wounds; prior to admission living home alone and w/o much support.   Consultants:  PT  Wound care service  Procedures:  See below for x-ray reports  Hydrotherapy by PT  Antibiotics:  vanc and zosyn  HPI/Subjective: Afebrile; complaining of mild SOB, place on oxygen overnight; no CP. On exam decrease BS at lung bases  Objective: Filed Vitals:   08/07/12 1430 08/07/12 1523 08/07/12 2234 08/08/12 0653  BP: 136/77 110/58 116/52 133/68  Pulse: 76 78 75 83  Temp: 98.5 F (36.9 C) 98.4 F (36.9 C) 99 F (37.2 C) 99.6 F (37.6 C)  TempSrc: Oral Oral Oral Oral  Resp: 20 18 18 18   Height:      Weight:      SpO2: 99% 96% 99% 100%    Intake/Output Summary (Last 24 hours) at 08/08/12 1339 Last data filed at 08/08/12 0500  Gross per 24 hour  Intake    360 ml  Output    950 ml  Net   -590 ml   Filed Weights   08/06/12 1100  Weight: 113.399 kg (250 lb)    Exam:   General:  Afebrile; complaining of mild SOB, reports pain on his LE is tolerable with available pain meds  Cardiovascular: S1 and S2, no rubs or gallops  Respiratory: decreased BS at bases  Abdomen: soft, NT, ND, positive BS  Musculoskeletal and skin:bilateral LE edema/induration/erythema; also with positive left foot open ulcer and multiple bilateral malodorous open wounds/small abscess.  Data Reviewed: Basic Metabolic Panel:  Recent Labs Lab 08/05/12 1246 08/05/12 2031 08/06/12 0650 08/07/12 0725 08/08/12 0554  NA 140  --  136 135 135  K 4.1  --  3.8 3.9 3.6  CL 102  --  101 101 100  CO2 28  --  27 25 24   GLUCOSE 137*  --  123* 107* 116*  BUN 12  --  13 13 13   CREATININE 1.39*  1.36* 1.54* 1.61* 1.55*  CALCIUM 9.8  --  8.7 8.7 8.6  MG  --  2.1  --   --   --    Liver Function Tests:  Recent Labs Lab 08/05/12 1246  AST 25  ALT 8  ALKPHOS 49  BILITOT 0.4  PROT 8.1  ALBUMIN 3.1*   CBC:  Recent Labs Lab 08/05/12 1246 08/05/12 2031 08/06/12 0650  WBC 5.3 6.7 6.4  NEUTROABS 3.6  --   --   HGB 12.1* 11.0* 9.8*  HCT 35.4* 32.8* 29.3*  MCV 92.4 91.4 91.0  PLT 304 284 260   BNP (last 3 results)  Recent Labs  08/05/12 1627  PROBNP 654.3*    Recent Results (from the past 240 hour(s))  CULTURE, BLOOD (ROUTINE X 2)     Status: None   Collection Time    08/05/12  3:45 PM      Result Value Range Status   Specimen Description BLOOD LEFT ANTECUBITAL   Final   Special Requests BOTTLES DRAWN AEROBIC AND ANAEROBIC 10CC   Final   Culture  Setup Time 08/05/2012 23:53   Final   Culture     Final   Value:        BLOOD CULTURE RECEIVED NO GROWTH TO DATE CULTURE WILL BE HELD FOR 5 DAYS BEFORE ISSUING A FINAL NEGATIVE REPORT   Report Status PENDING   Incomplete  CULTURE, BLOOD (ROUTINE X 2)     Status: None   Collection Time    08/05/12  3:55 PM      Result Value Range Status   Specimen Description BLOOD HAND LEFT   Final   Special Requests BOTTLES DRAWN AEROBIC ONLY 10CC   Final   Culture  Setup Time 08/05/2012 23:52   Final   Culture     Final   Value:        BLOOD CULTURE RECEIVED NO GROWTH TO DATE CULTURE WILL BE HELD FOR 5 DAYS BEFORE ISSUING A FINAL NEGATIVE REPORT   Report Status PENDING   Incomplete  WOUND CULTURE     Status: None   Collection Time    08/05/12  4:18 PM      Result Value Range Status   Specimen Description WOUND FOOT LEFT   Final   Special Requests NONE   Final   Gram Stain     Final   Value: ABUNDANT WBC PRESENT, PREDOMINANTLY PMN     NO SQUAMOUS EPITHELIAL CELLS SEEN     ABUNDANT GRAM NEGATIVE RODS     ABUNDANT GRAM POSITIVE COCCI     IN PAIRS   Culture ABUNDANT PSEUDOMONAS AERUGINOSA   Final   Report Status PENDING    Incomplete     Studies: US Renal  08/07/2012  *RADIOLOGY REPORT*  Clinical Data: 79 year old male with renal failure.  RENAL/URINARY TRACT ULTRASOUND COMPLETE  Comparison:  None.  Findings:  Right Kidney:  No hydronephrosis.  Renal length 10.6 cm. Cortical echotexture within normal limits.  8 mm upper pole cortical simple appearing cyst.  Left Kidney:  No hydronephrosis.  Renal  length 11.6 cm.  Cortical echotexture within normal limits.  Bladder:  Unremarkable.  IMPRESSION: Negative.   Original Report Authenticated By: Roselyn Reef, M.D.     Scheduled Meds: . allopurinol  300 mg Oral Daily  . aspirin EC  81 mg Oral Daily  . furosemide  20 mg Oral Daily  . heparin  5,000 Units Subcutaneous Q8H  . insulin aspart  0-5 Units Subcutaneous QHS  . insulin aspart  0-9 Units Subcutaneous TID WC  . isosorbide-hydrALAZINE  1 tablet Oral BID  . nicotine  14 mg Transdermal Daily  . pantoprazole  40 mg Oral Q1200  . piperacillin-tazobactam (ZOSYN)  IV  3.375 g Intravenous Q8H  . vancomycin  1,500 mg Intravenous Q24H  . verapamil  180 mg Oral Daily   Continuous Infusions:   Principal Problem:   Lymphadenitis Active Problems:   HTN (hypertension)   Renal insufficiency   Tobacco abuse   Gout   Hyperglycemia   Cellulitis   Time spent: >30 minutes   Talyssa Gibas  [facility] Pager [phone]. If 7PM-7AM, please contact night-coverage at [email or web address], password [facility] 08/08/2012, 1:39 PM  LOS: 3 days

## 2012-08-09 LAB — CBC
HCT: 28.7 % — ABNORMAL LOW (ref 39.0–52.0)
Hemoglobin: 9.9 g/dL — ABNORMAL LOW (ref 13.0–17.0)
MCV: 90.3 fL (ref 78.0–100.0)
RBC: 3.18 MIL/uL — ABNORMAL LOW (ref 4.22–5.81)
WBC: 6 10*3/uL (ref 4.0–10.5)

## 2012-08-09 LAB — BASIC METABOLIC PANEL
CO2: 27 mEq/L (ref 19–32)
Chloride: 100 mEq/L (ref 96–112)
Glucose, Bld: 121 mg/dL — ABNORMAL HIGH (ref 70–99)
Sodium: 136 mEq/L (ref 135–145)

## 2012-08-09 LAB — GLUCOSE, CAPILLARY
Glucose-Capillary: 146 mg/dL — ABNORMAL HIGH (ref 70–99)
Glucose-Capillary: 108 mg/dL — ABNORMAL HIGH (ref 70–99)

## 2012-08-09 MED ORDER — POLYETHYLENE GLYCOL 3350 17 G PO PACK
17.0000 g | PACK | Freq: Every day | ORAL | Status: DC
  Administered 2012-08-09 – 2012-08-13 (×4): 17 g via ORAL
  Filled 2012-08-09 (×6): qty 1

## 2012-08-09 MED ORDER — DOCUSATE SODIUM 100 MG PO CAPS
100.0000 mg | ORAL_CAPSULE | Freq: Two times a day (BID) | ORAL | Status: DC
  Administered 2012-08-09 – 2012-08-13 (×8): 100 mg via ORAL
  Filled 2012-08-09 (×10): qty 1

## 2012-08-09 NOTE — Progress Notes (Signed)
Inpatient Diabetes Program Recommendations  AACE/ADA: New Consensus Statement on Inpatient Glycemic Control (2013)  Target Ranges:  Prepandial:   less than 140 mg/dL      Peak postprandial:   less than 180 mg/dL (1-2 hours)      Critically ill patients:  140 - 180 mg/dL   Hae reviewed pt history and diagnosis of new onset diabetes. Noted HgbA1c of 6.9%.  However, it must be taken into account that during the 2 weeks prior to pt's hospital admission, pt had been extremely sick and has a tremendous influence on the HgbA1C. I am concerned, given his advanced age and living circumstances,  that using either Glipizide or Glucotrol at this point may pose a potential for hypoglycemia. Once the patient's infection is cleared, his glucose control will be better and pt will have less insulin resistance.  Would recommend that diet alone be tried initially.  If pt is to have home health assistance, his blood glucose can be checked and monitored by the [facility] aide/RN. Thank you, Rosita Kea, RN, CNS, Diabetes Coordinator [phone])

## 2012-08-09 NOTE — Clinical Documentation Improvement (Signed)
DIABETIC  DOCUMENTATION CLARIFICATION QUERY  THIS DOCUMENT IS NOT A PERMANENT PART OF THE MEDICAL RECORD  TO RESPOND TO THE THIS QUERY, FOLLOW THE INSTRUCTIONS BELOW:  1. If needed, update documentation for the patient's encounter via the notes activity.  2. Access this query again and click edit on the In [employer].  3. After updating, or not, click F2 to complete all highlighted (required) fields concerning your review. Select "additional documentation in the medical record" OR "no additional documentation provided".  4. Click Sign note button.  5. The deficiency will fall out of your In Basket *Please let us know if you are not able to complete this workflow by phone or e-mail (listed below).  Please update your documentation within the medical record to reflect your response to this query.                                                                                        08/09/12   Dear Dr. Clifton James [facility],  In a better effort to capture your patient's severity of illness, reflect appropriate length of stay and utilization of resources, a review of the patient medical record has revealed the following indicators.    Based on your clinical judgment, please clarify and document in a progress note and/or discharge summary the clinical condition associated with the following supporting information:  In responding to this query please exercise your independent judgment.  The fact that a query is asked, does not imply that any particular answer is desired or expected.  Please clarify and specify Diabetes type, control, manifestations, and associated conditions.  Patient with Diabetes type 2, and neuropathy, current diagnosis of ulcer to left dorsal foot and lymphadenitiis to bilateral lower extremties.  If possible please document if the condition is caused by or due to the diabetes?  Thank you    Possible Clinical Conditions?   Associated conditions:  DM  cellulitis  DM skin ulcer  X Other Condition  Cannot Clinically determine      Treatment: Hydrotherapy, "silver hydrofiber to be placed at the open wounds to add antimicrobial wicking effects. Wrap in kerlix and coban after hydrotherapy".   You may use possible, probable, or suspect with inpatient documentation. possible, probable, suspected diagnoses MUST be documented at the time of discharge  Reviewed: diabetes contributing, but not leading cause. He had lymphadenitis  Thank You,  [place] Documentation Specialist, BSN: Pager [phone] off # [phone]  Health Information Management [place]

## 2012-08-09 NOTE — Progress Notes (Signed)
Physical Therapy Wound Treatment Patient Details  Name: [name] [name] MRN: [id number] Date of Birth: [birth year]  Today's Date: 08/09/2012 Time: [id number] Time Calculation (min): 40 min   08/09/12 1004  Subjective Assessment  Subjective Reports incr pain in feet due to gout  Patient and Family Stated Goals to save my legs  Prior Treatments soaked his legs at home; hydrocortisone cream  Evaluation and Treatment  Evaluation and Treatment Procedures Explained to Patient/Family Yes  Evaluation and Treatment Procedures agreed to  Wound 08/05/12 Other (Comment) Foot Left area has clear drainage.   Date First Assessed/Time First Assessed: 08/05/12 1730   Wound Type: (c) Other (Comment)  Location: Foot  Location Orientation: Left  Wound Description (Comments): area has clear drainage.   Present on Admission: Yes  Site / Wound Assessment Black;Brown;Granulation tissue;Pale;Red;Yellow  % Wound base Red or Granulating 10%  % Wound base Yellow 80%  % Wound base Black 10%  Peri-wound Assessment Intact;Black;Induration;Other (Comment) (dry skin)  Margins Unattacted edges (unapproximated)  Closure None  Drainage Amount Moderate  Drainage Description Purulent;Odor  Non-staged Wound Description Full thickness  Treatment Debridement (Selective);Hydrotherapy (Pulse lavage)  Dressing Type Other (Comment) (RN ordered dressing supplies and to apply when arrive)  Wound 08/06/12 posterior Right lower leg  Date First Assessed/Time First Assessed: 08/06/12 1537   Wound Description (Comments): posterior Right lower leg  Present on Admission: Yes  Site / Wound Assessment Pink;Red  % Wound base Red or Granulating 100%  Peri-wound Assessment Denuded;Maceration  Margins Unattacted edges (unapproximated)  Closure None  Drainage Amount Minimal  Drainage Description Serous  Non-staged Wound Description Partial thickness  Treatment Hydrotherapy (Pulse lavage)  Dressing Type Other (Comment) (RN ordered dressing  supplies and to apply when arrive)  Wound 08/06/12 Rt medial lower leg  Date First Assessed/Time First Assessed: 08/06/12 1540   Wound Description (Comments): Rt medial lower leg  Present on Admission: Yes  Site / Wound Assessment Clean  Margins Attached edges (approximated)  Closure None  Drainage Amount None  Dressing Changed Other (Comment) (no dressing needed; clean, intact)  Wound 08/06/12 Left lateral lower leg  Date First Assessed/Time First Assessed: 08/06/12 1542   Wound Description (Comments): Left lateral lower leg  Present on Admission: Yes  Site / Wound Assessment Pink;Red  % Wound base Red or Granulating 100%  Peri-wound Assessment Maceration;Induration  Margins Unattacted edges (unapproximated)  Closure None  Drainage Amount None  Non-staged Wound Description Partial thickness  Treatment Hydrotherapy (Pulse lavage)  Dressing Type Other (Comment) (RN ordered dressing supplies and to apply when arrive)  Hydrotherapy  Pulsed Lavage with Suction (psi) (4-8 psi)  Pulsed Lavage with Suction - Normal Saline Used 1000 mL  Pulsed Lavage Tip Tip with splash shield  Pulsed lavage therapy - wound location Rt posterior lower leg, Lt lateral lower leg, Lt foot dorsum  Selective Debridement  Selective Debridement - Location Lt foot  Selective Debridement - Tools Used Forceps;Scissors  Selective Debridement - Tissue Removed yellow and black eschar  Wound Therapy - Assess/Plan/Recommendations  Wound Therapy - Clinical Statement Wounds with minimal drainage with odor present. Will ask Bethalto RN to assess wounds with PT on 4/15  Wound Therapy - Functional Problem List limited mobility due to edematous, weeping legs  Factors Delaying/Impairing Wound Healing Diabetes Mellitus;Infection - systemic/local;Immobility;Multiple medical problems  Hydrotherapy Plan Debridement;Patient/family education;Pulsatile lavage with suction  Wound Therapy - Frequency 6X / week  Wound Therapy - Follow Up  Recommendations Skilled nursing facility  Wound Therapy Goals - Improve the  function of patient's integumentary system by progressing the wound(s) through the phases of wound healing by:  Decrease Necrotic Tissue to left foot <50%  Decrease Necrotic Tissue - Progress Progressing toward goal  Increase Granulation Tissue to Lt foot to >50%  Increase Granulation Tissue - Progress Progressing toward goal  Improve Drainage Characteristics Min  Improve Drainage Characteristics - Progress Progressing toward goal  Patient/Family will be able to  verbalize understanding of ways he may contribute to his legs healing (elevation, nutrition, pressure relief).  Patient/Family Instruction Goal - Progress Progressing toward goal    08/09/2012 [name], PT Pager: [phone]

## 2012-08-09 NOTE — Progress Notes (Signed)
Clinical Social Work Department  BRIEF PSYCHOSOCIAL ASSESSMENT  Patient:Baby Milledge Account Number: [id number]  Admit date: 08/05/2012 Clinical Social Worker Rhea Pink, MSW Date/Time: 08/09/2012  Referred by: Physician Date Referred:  Referred for   SNF Placement   Other Referral:  Interview type: Patient's daughter- alma Other interview type: PSYCHOSOCIAL DATA  Living Status: lives with his daughter Admitted from facility:  Level of care:  Primary support name: Kathlene November Primary support relationship to patient: Daughter Degree of support available: Stong and vested Strong and vested  CURRENT CONCERNS  Current Concerns   Post-Acute Placement   Other Concerns:  SOCIAL WORK ASSESSMENT / PLAN  CSW met with pt re: PT recommendation for SNF.   Pt lives with his daughter. Patient's daughter wants what is best for patient and is agreeable to SNF search.   CSW explained placement process and answered questions.   Pt will need a facility with hydro-therapy   CSW completed FL2 and initiated SNF search.     Assessment/plan status: Information/Referral to [employer]  Other assessment/ plan:  Information/referral to community resources:  SNF   PTAR  PATIENT'S/FAMILY'S RESPONSE TO PLAN OF CARE:  Pt's daughter reports she wants what is best for the patient  And is agreeable to ST SNF in order to increase strength and independence with mobility prior to returning home  Pt verbalized understanding of placement process and appreciation for CSW assist.   Rhea Pink, MSW [phone]

## 2012-08-09 NOTE — Progress Notes (Signed)
[facility] PROGRESS NOTE  [name] [name] [id number] DOB: 02/14/1927 DOA: 08/05/2012 PCP: No PCP Per Patient  Assessment/Plan: 1-Bilateral LE lymphadenitis/abscess/cellulitis: concern for long term infection of his skin. Changes and discharge demonstrating infection present for many months.  -follow cx's and continue empiric broad-spectrum antibiotic therapy  -Follow wound care service rec's -ABI difficult to interpreted due to ongoing swelling; but signal on his toes guarantee good perfusion for healing.  -x-ray results not demonstrating osteomyelitis; but unfortunately not ruling it out either.  -PRN analgesics  -PT for hydrotherapy -diabetic state most likely contributing, but not the main etiology of his wounds.  2-HTN (hypertension): cozaar discontinued at this moment, given renal failure (acute on chronic). Will continue verapamil and bidil.  -low sodium diet  3-SOB: intermittent. OHS might be contributing. 2-D echo demonstrating grade 2 diastolic dysfunction. -BNP elevated at 654 -continue heart healthy diet -continue low dose lasix   4-Renal insufficiency/failure: acute on chronic (baseline per labs was stage 2 in 2011); now GFR in the low 40's.  -follow BMET and Cr trend; slowly continue improving -UA no showing UTI and renal US WNL -could be just progression of hypertensive/diabetic nephropathy -Vancomycin potentially also contributing; pharmacy to adjust dose.  5-Tobacco abuse: couseniling provided. Started on nicotine patch.   6-Gout: continue allopurinol; no acute flare  7-DM type 2: with A1C of 6.9; will continue treatment with SSI while in the hospital and at discharge will use glipizide or gliburide. Metformin not first choice given renal failure. -continue low carb diet.  8-GERD: continue PPI   9-Grade 2 diastolic dysfunction: improved and w/o further SOB sensation after lasix started. -continue close Intake and output, and daily weight. -low sodium  diet -low dose lasix daily -continue controlling HTN.  DVT: heparin.   Code Status: Full Family Communication: no family at bedside; care discussed with patient. Disposition Plan: might need SNF for further treatment of his wounds; prior to admission living home alone and w/o much support.   Consultants:  PT  Wound care service  Procedures:  See below for x-ray reports  Hydrotherapy by PT  Antibiotics:  vanc and zosyn  HPI/Subjective: Afebrile; reports improvement in his LE's pain. No CP and no further episodes of SOB.  Objective: Filed Vitals:   08/08/12 1400 08/08/12 1500 08/08/12 2035 08/09/12 0519  BP: 116/54 106/57 121/65 125/58  Pulse: 84 79 83 75  Temp: 98.5 F (36.9 C) 98.6 F (37 C) 100.3 F (37.9 C) 98.9 F (37.2 C)  TempSrc: Oral Oral Oral Oral  Resp: 18 18 18 18   Height:      Weight:      SpO2: 98% 99% 99%     Intake/Output Summary (Last 24 hours) at 08/09/12 1254 Last data filed at 08/09/12 0830  Gross per 24 hour  Intake   1000 ml  Output   1850 ml  Net   -850 ml   Filed Weights   08/06/12 1100  Weight: 113.399 kg (250 lb)    Exam:   General:  Afebrile; no SOB, reports pain on his LE is getting better.  Cardiovascular: S1 and S2, no rubs or gallops  Respiratory: decreased BS at bases  Abdomen: soft, NT, ND, positive BS  Musculoskeletal and skin: bilateral LE edema/induration/erythema; also with positive left foot open ulcer and multiple bilateral malodorous open wounds/small abscess.  Data Reviewed: Basic Metabolic Panel:  Recent Labs Lab 08/05/12 1246 08/05/12 2031 08/06/12 0650 08/07/12 0725 08/08/12 0554 08/09/12 0613  NA 140  --  136  135 135 136  K 4.1  --  3.8 3.9 3.6 3.6  CL 102  --  101 101 100 100  CO2 28  --  27 25 24 27   GLUCOSE 137*  --  123* 107* 116* 121*  BUN 12  --  13 13 13 12   CREATININE 1.39* 1.36* 1.54* 1.61* 1.55* 1.57*  CALCIUM 9.8  --  8.7 8.7 8.6 8.7  MG  --  2.1  --   --   --   --     Liver Function Tests:  Recent Labs Lab 08/05/12 1246  AST 25  ALT 8  ALKPHOS 49  BILITOT 0.4  PROT 8.1  ALBUMIN 3.1*   CBC:  Recent Labs Lab 08/05/12 1246 08/05/12 2031 08/06/12 0650 08/09/12 0613  WBC 5.3 6.7 6.4 6.0  NEUTROABS 3.6  --   --   --   HGB 12.1* 11.0* 9.8* 9.9*  HCT 35.4* 32.8* 29.3* 28.7*  MCV 92.4 91.4 91.0 90.3  PLT 304 284 260 271   BNP (last 3 results)  Recent Labs  08/05/12 1627  PROBNP 654.3*    Recent Results (from the past 240 hour(s))  CULTURE, BLOOD (ROUTINE X 2)     Status: None   Collection Time    08/05/12  3:45 PM      Result Value Range Status   Specimen Description BLOOD LEFT ANTECUBITAL   Final   Special Requests BOTTLES DRAWN AEROBIC AND ANAEROBIC 10CC   Final   Culture  Setup Time 08/05/2012 23:53   Final   Culture     Final   Value:        BLOOD CULTURE RECEIVED NO GROWTH TO DATE CULTURE WILL BE HELD FOR 5 DAYS BEFORE ISSUING A FINAL NEGATIVE REPORT   Report Status PENDING   Incomplete  CULTURE, BLOOD (ROUTINE X 2)     Status: None   Collection Time    08/05/12  3:55 PM      Result Value Range Status   Specimen Description BLOOD HAND LEFT   Final   Special Requests BOTTLES DRAWN AEROBIC ONLY 10CC   Final   Culture  Setup Time 08/05/2012 23:52   Final   Culture     Final   Value:        BLOOD CULTURE RECEIVED NO GROWTH TO DATE CULTURE WILL BE HELD FOR 5 DAYS BEFORE ISSUING A FINAL NEGATIVE REPORT   Report Status PENDING   Incomplete  WOUND CULTURE     Status: None   Collection Time    08/05/12  4:18 PM      Result Value Range Status   Specimen Description WOUND FOOT LEFT   Final   Special Requests NONE   Final   Gram Stain     Final   Value: ABUNDANT WBC PRESENT, PREDOMINANTLY PMN     NO SQUAMOUS EPITHELIAL CELLS SEEN     ABUNDANT GRAM NEGATIVE RODS     ABUNDANT GRAM POSITIVE COCCI     IN PAIRS   Culture     Final   Value: ABUNDANT PSEUDOMONAS AERUGINOSA     MODERATE STAPHYLOCOCCUS AUREUS     Note: RIFAMPIN  AND GENTAMICIN SHOULD NOT BE USED AS SINGLE DRUGS FOR TREATMENT OF STAPH INFECTIONS.   Report Status PENDING   Incomplete   Organism ID, Bacteria PSEUDOMONAS AERUGINOSA   Final     Studies: US Renal  08/07/2012  *RADIOLOGY REPORT*  Clinical Data: 79 year old male with renal failure.  RENAL/URINARY TRACT ULTRASOUND COMPLETE  Comparison:  None.  Findings:  Right Kidney:  No hydronephrosis.  Renal length 10.6 cm. Cortical echotexture within normal limits.  8 mm upper pole cortical simple appearing cyst.  Left Kidney:  No hydronephrosis.  Renal length 11.6 cm.  Cortical echotexture within normal limits.  Bladder:  Unremarkable.  IMPRESSION: Negative.   Original Report Authenticated By: Roselyn Reef, M.D.    Scheduled Meds: . allopurinol  300 mg Oral Daily  . aspirin EC  81 mg Oral Daily  . furosemide  20 mg Oral Daily  . heparin  5,000 Units Subcutaneous Q8H  . insulin aspart  0-5 Units Subcutaneous QHS  . insulin aspart  0-9 Units Subcutaneous TID WC  . isosorbide-hydrALAZINE  1 tablet Oral BID  . nicotine  14 mg Transdermal Daily  . pantoprazole  40 mg Oral Q1200  . piperacillin-tazobactam (ZOSYN)  IV  3.375 g Intravenous Q8H  . vancomycin  1,500 mg Intravenous Q24H  . verapamil  180 mg Oral Daily   Continuous Infusions:   Principal Problem:   Lymphadenitis Active Problems:   HTN (hypertension)   Renal insufficiency   Tobacco abuse   Gout   Hyperglycemia   Cellulitis   Time spent: >30 minutes  Akeisha Lagerquist  [facility] Pager [phone]. If 7PM-7AM, please contact night-coverage at [email or web address], password [facility] 08/09/2012, 12:54 PM  LOS: 4 days

## 2012-08-10 DIAGNOSIS — L0390 Cellulitis, unspecified: Secondary | ICD-10-CM

## 2012-08-10 DIAGNOSIS — I889 Nonspecific lymphadenitis, unspecified: Secondary | ICD-10-CM

## 2012-08-10 HISTORY — DX: Nonspecific lymphadenitis, unspecified: I88.9

## 2012-08-10 LAB — GLUCOSE, CAPILLARY
Glucose-Capillary: 115 mg/dL — ABNORMAL HIGH (ref 70–99)
Glucose-Capillary: 99 mg/dL (ref 70–99)
Glucose-Capillary: 139 mg/dL — ABNORMAL HIGH (ref 70–99)

## 2012-08-10 LAB — WOUND CULTURE

## 2012-08-10 NOTE — Progress Notes (Signed)
Physical Therapy Wound Treatment Patient Details  Name: [name] [name] MRN: [id number] Date of Birth: 01-Mar-1927  Today's Date: 08/10/2012 Time: [id number] Time Calculation (min): 28 min   08/10/12 1026  Subjective Assessment  Subjective Reports feet are feeling better today  Patient and Family Stated Goals to save my legs  Prior Treatments soaked his legs at home; hydrocortisone cream  Evaluation and Treatment  Evaluation and Treatment Procedures Explained to Patient/Family Yes  Evaluation and Treatment Procedures agreed to  Wound 08/05/12 Other (Comment) Foot Left base 5th metatarsal  Date First Assessed/Time First Assessed: 08/05/12 1730   Wound Type: (c) Other (Comment)  Location: Foot  Location Orientation: Left  Wound Description (Comments): base 5th metatarsal  Present on Admission: Yes  Site / Wound Assessment Black;Brown;Granulation tissue;Pale;Red;Yellow  % Wound base Red or Granulating 20%  % Wound base Yellow 75%  % Wound base Black 5%  Peri-wound Assessment Intact;Black;Induration;Other (Comment) (dry skin)  Margins Unattacted edges (unapproximated)  Closure None  Drainage Amount Moderate  Drainage Description Purulent;Odor  Non-staged Wound Description Full thickness  Dressing Type Other (Comment);Silver hydrofiber;Gauze (Comment);Compression wrap (dry 4x4, kerlix, 4" coban wrap)  Dressing Status Clean;Dry;Intact  Wound 08/06/12 posterior Right lower leg  Date First Assessed/Time First Assessed: 08/06/12 1537   Wound Description (Comments): posterior Right lower leg  Present on Admission: Yes  Site / Wound Assessment Pink;Red  % Wound base Red or Granulating 100%  Peri-wound Assessment Denuded;Pink  Margins Unattacted edges (unapproximated)  Closure None  Drainage Amount Scant  Drainage Description Serous  Non-staged Wound Description Partial thickness  Dressing Type Other (Comment);Silver hydrofiber;Peripads;Gauze (Comment);Compression wrap (kerlix, 4" coban)   Dressing Status Clean;Dry;Intact  Hydrotherapy  Pulsed Lavage with Suction (psi) (8-12 psi)  Pulsed Lavage with Suction - Normal Saline Used 1000 mL  Pulsed Lavage Tip Tip with splash shield  Pulsed lavage therapy - wound location Rt posterior lower leg, Lt foot dorsum  Selective Debridement  Selective Debridement - Location Lt foot  Selective Debridement - Tools Used Forceps;Scissors  Selective Debridement - Tissue Removed yellow and black eschar  Wound Therapy - Assess/Plan/Recommendations  Wound Therapy - Clinical Statement 2 areas now healed (Rt medial lower leg and Lt lateral lower leg) with the posterior Rt leg nearly healed. Wound on dorsum of Lt foot continues to make progress, however anticipate wound size will incr as black eschar removed. Feel pt will continue to benefit from hydrotherapy for another 7-10 days.  Wound Therapy - Functional Problem List limited mobility due to edematous, weeping legs  Factors Delaying/Impairing Wound Healing Diabetes Mellitus;Infection - systemic/local;Immobility;Multiple medical problems  Hydrotherapy Plan Debridement;Patient/family education;Pulsatile lavage with suction  Wound Therapy - Frequency 6X / week  Wound Therapy - Follow Up Recommendations Skilled nursing facility  Wound Therapy Goals - Improve the function of patient's integumentary system by progressing the wound(s) through the phases of wound healing by:  Decrease Necrotic Tissue to left foot <50%  Increase Granulation Tissue to Lt foot to >50%  Improve Drainage Characteristics Min  Patient/Family will be able to  verbalize understanding of ways he may contribute to his legs healing (elevation, nutrition, pressure relief).    08/10/2012 [name], PT Pager: [phone]

## 2012-08-10 NOTE — Progress Notes (Signed)
[facility] PROGRESS NOTE  [name] [name] [id number] DOB: 04/19/1927 DOA: 08/05/2012 PCP: No PCP Per Patient  Assessment/Plan: 1-Bilateral LE lymphadenitis/abscess/cellulitis: concern for long term infection of his skin. Changes and discharge demonstrating infection present for many months.  -follow cx's and continue empiric broad-spectrum antibiotic therapy for 1-2 more days -after that will place on doxycycline for another 2 weeks. -Follow wound care service rec's -ABI difficult to interpreted due to ongoing swelling; but signal on his toes guarantee good perfusion for healing.  -x-ray results not demonstrating osteomyelitis; but unfortunately not ruling it out either.  -PRN analgesics  -Cont PT for hydrotherapy -diabetic state most likely contributing, but not the main etiology of his wounds.  2-HTN (hypertension): cozaar discontinued at this moment, given renal failure (acute on chronic). Will continue verapamil and bidil.  -low sodium diet -also low dose lasix   3-SOB: intermittent. OHS might be contributing. 2-D echo demonstrating grade 2 diastolic dysfunction. -BNP elevated at 654 -continue heart healthy diet -continue low dose lasix   4-Renal insufficiency/failure: acute on chronic (baseline per labs was stage 2 in 2011); now GFR in the low 40's.  -follow BMET and Cr trend; slowly continue improving -UA no showing UTI and renal US WNL -could be just progression of hypertensive/diabetic nephropathy -Vancomycin potentially also contributing; pharmacy to adjust dose.  5-Tobacco abuse: couseniling provided. Started on nicotine patch.   6-Gout: continue allopurinol; no acute flare  7-DM type 2: with A1C of 6.9; will continue treatment with SSI while in the hospital and at discharge will use glipizide or gliburide. Metformin not first choice given renal failure. -continue low carb diet.  8-GERD: continue PPI   9-Grade 2 diastolic dysfunction: improved and w/o further  SOB sensation after lasix started. -continue close Intake and output, and daily weight. -low sodium diet -low dose lasix daily -continue controlling HTN.  DVT: heparin.   Code Status: Full Family Communication: no family at bedside; care discussed with patient. Disposition Plan: might need SNF for further treatment of his wounds; prior to admission living home alone and w/o much support.   Consultants:  PT  Wound care service  Procedures:  See below for x-ray reports  Hydrotherapy by PT  Antibiotics:  vanc and zosyn  HPI/Subjective: Afebrile; reports improvement in his LE's pain. No CP and no further episodes of SOB. In agreement with SNF placement.  Objective: Filed Vitals:   08/09/12 1338 08/09/12 2142 08/10/12 0524 08/10/12 1300  BP: 111/58 130/67 136/72 149/78  Pulse: 83 78 89 103  Temp: 98.4 F (36.9 C) 99.5 F (37.5 C) 99.2 F (37.3 C) 100 F (37.8 C)  TempSrc:      Resp: 18 18 18 18   Height:      Weight:      SpO2: 96% 97% 92% 94%    Intake/Output Summary (Last 24 hours) at 08/10/12 1649 Last data filed at 08/10/12 0900  Gross per 24 hour  Intake      0 ml  Output    301 ml  Net   -301 ml   Filed Weights   08/06/12 1100  Weight: 113.399 kg (250 lb)    Exam:   General:  Afebrile; no SOB, reports pain on his LE is getting better.  Cardiovascular: S1 and S2, no rubs or gallops  Respiratory: decreased BS at bases  Abdomen: soft, NT, ND, positive BS  Musculoskeletal and skin: bilateral LE edema/induration/erythema; also with positive left foot open ulcer and multiple bilateral malodorous open wounds/small abscess.  Data Reviewed: Basic Metabolic Panel:  Recent Labs Lab 08/05/12 1246 08/05/12 2031 08/06/12 0650 08/07/12 0725 08/08/12 0554 08/09/12 0613  NA 140  --  136 135 135 136  K 4.1  --  3.8 3.9 3.6 3.6  CL 102  --  101 101 100 100  CO2 28  --  27 25 24 27   GLUCOSE 137*  --  123* 107* 116* 121*  BUN 12  --  13 13 13 12    CREATININE 1.39* 1.36* 1.54* 1.61* 1.55* 1.57*  CALCIUM 9.8  --  8.7 8.7 8.6 8.7  MG  --  2.1  --   --   --   --    Liver Function Tests:  Recent Labs Lab 08/05/12 1246  AST 25  ALT 8  ALKPHOS 49  BILITOT 0.4  PROT 8.1  ALBUMIN 3.1*   CBC:  Recent Labs Lab 08/05/12 1246 08/05/12 2031 08/06/12 0650 08/09/12 0613  WBC 5.3 6.7 6.4 6.0  NEUTROABS 3.6  --   --   --   HGB 12.1* 11.0* 9.8* 9.9*  HCT 35.4* 32.8* 29.3* 28.7*  MCV 92.4 91.4 91.0 90.3  PLT 304 284 260 271   BNP (last 3 results)  Recent Labs  08/05/12 1627  PROBNP 654.3*    Recent Results (from the past 240 hour(s))  CULTURE, BLOOD (ROUTINE X 2)     Status: None   Collection Time    08/05/12  3:45 PM      Result Value Range Status   Specimen Description BLOOD LEFT ANTECUBITAL   Final   Special Requests BOTTLES DRAWN AEROBIC AND ANAEROBIC 10CC   Final   Culture  Setup Time 08/05/2012 23:53   Final   Culture     Final   Value:        BLOOD CULTURE RECEIVED NO GROWTH TO DATE CULTURE WILL BE HELD FOR 5 DAYS BEFORE ISSUING A FINAL NEGATIVE REPORT   Report Status PENDING   Incomplete  CULTURE, BLOOD (ROUTINE X 2)     Status: None   Collection Time    08/05/12  3:55 PM      Result Value Range Status   Specimen Description BLOOD HAND LEFT   Final   Special Requests BOTTLES DRAWN AEROBIC ONLY 10CC   Final   Culture  Setup Time 08/05/2012 23:52   Final   Culture     Final   Value:        BLOOD CULTURE RECEIVED NO GROWTH TO DATE CULTURE WILL BE HELD FOR 5 DAYS BEFORE ISSUING A FINAL NEGATIVE REPORT   Report Status PENDING   Incomplete  WOUND CULTURE     Status: None   Collection Time    08/05/12  4:18 PM      Result Value Range Status   Specimen Description WOUND FOOT LEFT   Final   Special Requests NONE   Final   Gram Stain     Final   Value: ABUNDANT WBC PRESENT, PREDOMINANTLY PMN     NO SQUAMOUS EPITHELIAL CELLS SEEN     ABUNDANT GRAM NEGATIVE RODS     ABUNDANT GRAM POSITIVE COCCI     IN PAIRS    Culture     Final   Value: ABUNDANT PSEUDOMONAS AERUGINOSA     MODERATE METHICILLIN RESISTANT STAPHYLOCOCCUS AUREUS     Note: RIFAMPIN AND GENTAMICIN SHOULD NOT BE USED AS SINGLE DRUGS FOR TREATMENT OF STAPH INFECTIONS. This organism DOES NOT demonstrate inducible Clindamycin resistance in  vitro. CRITICAL RESULT CALLED TO, READ BACK BY AND VERIFIED WITH: SILVIA HOWELL      08/10/12 AT 845 AM BY [facility]   Report Status 08/10/2012 FINAL   Final   Organism ID, Bacteria PSEUDOMONAS AERUGINOSA   Final   Organism ID, Bacteria METHICILLIN RESISTANT STAPHYLOCOCCUS AUREUS   Final     Studies: No results found. Scheduled Meds: . allopurinol  300 mg Oral Daily  . aspirin EC  81 mg Oral Daily  . docusate sodium  100 mg Oral BID  . furosemide  20 mg Oral Daily  . heparin  5,000 Units Subcutaneous Q8H  . insulin aspart  0-5 Units Subcutaneous QHS  . insulin aspart  0-9 Units Subcutaneous TID WC  . isosorbide-hydrALAZINE  1 tablet Oral BID  . nicotine  14 mg Transdermal Daily  . pantoprazole  40 mg Oral Q1200  . piperacillin-tazobactam (ZOSYN)  IV  3.375 g Intravenous Q8H  . polyethylene glycol  17 g Oral Daily  . vancomycin  1,500 mg Intravenous Q24H  . verapamil  180 mg Oral Daily   Continuous Infusions:   Principal Problem:   Lymphadenitis Active Problems:   HTN (hypertension)   Renal insufficiency   Tobacco abuse   Gout   Hyperglycemia   Cellulitis   Time spent: >30 minutes  Huie Ghuman  [facility] Pager [phone]. If 7PM-7AM, please contact night-coverage at [email or web address], password [facility] 08/10/2012, 4:49 PM  LOS: 5 days

## 2012-08-10 NOTE — Progress Notes (Signed)
OT Cancellation Note  Patient Details Name: [name] [name] MRN: [id number] DOB: [birth year]   Cancelled Treatment:    Reason Eval/Treat Not Completed: Other (comment) Pt plans to D/C to SNF, please reorder if OT eval needed for SNF admission. Defer all OT to SNF. Thanks Frederic, OTR/L  [phone] 08/10/2012 08/10/2012, 3:58 PM

## 2012-08-10 NOTE — Evaluation (Signed)
Physical Therapy Evaluation Patient Details Name: [name] [name] MRN: [id number] DOB: April 25, 1927 Today's Date: 08/10/2012 Time: [id number] PT Time Calculation (min): 27 min  PT Assessment / Plan / Recommendation Clinical Impression  Adm due to bil LE wounds with weeping and foul odor. Wounds continue to improve. Pt with slight imbalance and need for instruction in safe use of RW (vs progress to using a cane again, as he did PTA). Will benefit from PT to incr safety and independence with mobility.    PT Assessment  Patient needs continued PT services    Follow Up Recommendations  SNF;Supervision/Assistance - 24 hour    Does the patient have the potential to tolerate intense rehabilitation      Barriers to Discharge Other (comment) (need for hydrotherapy to Lt foot)      Equipment Recommendations  Rolling walker with 5" wheels    Recommendations for Other Services OT consult   Frequency Min 2X/week    Precautions / Restrictions Precautions Precautions: Fall Restrictions Other Position/Activity Restrictions: ? should limit ambulation distances due to wound over Lt 5th metatarsal head   Pertinent Vitals/Pain Reports less pain in bil feet today.      Mobility  Bed Mobility Bed Mobility: Rolling Left;Left Sidelying to Sit;Sitting - Scoot to Edge of Bed;Sit to Supine Rolling Left: 6: Modified independent (Device/Increase time);With rail Left Sidelying to Sit: 5: Supervision;With rails Sitting - Scoot to Edge of Bed: 7: Independent Sit to Supine: 4: Min assist Details for Bed Mobility Assistance: assist to raise LLE onto mattress with sit to supine Transfers Transfers: Sit to Stand;Stand to Sit Sit to Stand: 4: Min assist Stand to Sit: 4: Min guard Details for Transfer Assistance: x 2; min assist to shift forward over base of support; vc for safe use of RW Ambulation/Gait Ambulation/Gait Assistance: 4: Min assist Ambulation Distance (Feet): 25 Feet (12 ft, seated rest, 13  ft) Assistive device: Rolling walker Ambulation/Gait Assistance Details: vc for upright posture (tends to flex at trunk, hips, knees); vc for maneuvering RW into tight spaces/around door to bathroom Gait Pattern: Step-to pattern;Decreased stride length;Wide base of support;Trunk flexed    Exercises General Exercises - Lower Extremity Straight Leg Raises: AROM;Both;10 reps;Supine   PT Diagnosis: Difficulty walking  PT Problem List: Decreased activity tolerance;Decreased balance;Decreased mobility;Decreased knowledge of use of DME;Decreased safety awareness;Decreased skin integrity PT Treatment Interventions: DME instruction;Gait training;Functional mobility training;Therapeutic activities;Therapeutic exercise;Patient/family education   PT Goals Acute Rehab PT Goals PT Goal Formulation: With patient Time For Goal Achievement: 08/24/12 Potential to Achieve Goals: Good Pt will go Supine/Side to Sit: with modified independence PT Goal: Supine/Side to Sit - Progress: Goal set today Pt will go Sit to Supine/Side: with modified independence PT Goal: Sit to Supine/Side - Progress: Goal set today Pt will go Sit to Stand: with modified independence PT Goal: Sit to Stand - Progress: Goal set today Pt will go Stand to Sit: with modified independence PT Goal: Stand to Sit - Progress: Goal set today Pt will Ambulate: 16 - 50 feet;with modified independence;with least restrictive assistive device PT Goal: Ambulate - Progress: Goal set today  Visit Information  Last PT Received On: 08/10/12 Assistance Needed: +1    Subjective Data  Subjective: I could walk with my walking stick a little Patient Stated Goal: ultimately return home with daughter   Prior Functioning  Home Living Lives With: Daughter Available Help at Discharge: [place] (plan for continued hydrotherapy to Lt foot) 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 Prior Function  Level of Independence:  Independent with assistive device(s) Comments: reports he used scooter more than he ambulated Communication Communication: No difficulties    Cognition  Cognition Overall Cognitive Status: Appears within functional limits for tasks assessed/performed Arousal/Alertness: Awake/alert Behavior During Session: [facility] for tasks performed    Extremity/Trunk Assessment Right Upper Extremity Assessment RUE ROM/Strength/Tone: [facility] for tasks assessed Left Upper Extremity Assessment LUE ROM/Strength/Tone: WFL for tasks assessed Right Lower Extremity Assessment RLE ROM/Strength/Tone: [facility] for tasks assessed Left Lower Extremity Assessment LLE ROM/Strength/Tone: WFL for tasks assessed Trunk Assessment Trunk Assessment: Normal   Balance Balance Balance Assessed: Yes Static Sitting Balance Static Sitting - Balance Support: No upper extremity supported;Feet supported Static Sitting - Level of Assistance: 7: Independent Static Standing Balance Static Standing - Balance Support: Bilateral upper extremity supported;During functional activity Static Standing - Level of Assistance: 4: Min assist Static Standing - Comment/# of Minutes: during pericare after toileting; stood x 3 minutes  End of Session PT - End of Session Equipment Utilized During Treatment: Gait belt Activity Tolerance: Patient tolerated treatment well Patient left: in bed;with call bell/phone within reach;Other (comment) (returned to bed for wound care) Nurse Communication: Mobility status  GP     [name] [name] 08/10/2012, 10:05 AM Pager [phone]

## 2012-08-10 NOTE — Progress Notes (Addendum)
ANTIBIOTIC CONSULT NOTE  Pharmacy Consult for Vancomycin and Zosyn Indication: Bilateral lower extremity ulcers   No Known Allergies  Labs:  Recent Labs  08/08/12 0554 08/09/12 0613  WBC  --  6.0  HGB  --  9.9*  PLT  --  271  CREATININE 1.55* 1.57*   Estimated Creatinine Clearance: 45 ml/min (by C-G formula based on Cr of 1.57).  Recent Labs  08/08/12 1523  VANCOTROUGH 12.2     Microbiology: Recent Results (from the past 720 hour(s))  CULTURE, BLOOD (ROUTINE X 2)     Status: None   Collection Time    08/05/12  3:45 PM      Result Value Range Status   Specimen Description BLOOD LEFT ANTECUBITAL   Final   Special Requests BOTTLES DRAWN AEROBIC AND ANAEROBIC 10CC   Final   Culture  Setup Time 08/05/2012 23:53   Final   Culture     Final   Value:        BLOOD CULTURE RECEIVED NO GROWTH TO DATE CULTURE WILL BE HELD FOR 5 DAYS BEFORE ISSUING A FINAL NEGATIVE REPORT   Report Status PENDING   Incomplete  CULTURE, BLOOD (ROUTINE X 2)     Status: None   Collection Time    08/05/12  3:55 PM      Result Value Range Status   Specimen Description BLOOD HAND LEFT   Final   Special Requests BOTTLES DRAWN AEROBIC ONLY 10CC   Final   Culture  Setup Time 08/05/2012 23:52   Final   Culture     Final   Value:        BLOOD CULTURE RECEIVED NO GROWTH TO DATE CULTURE WILL BE HELD FOR 5 DAYS BEFORE ISSUING A FINAL NEGATIVE REPORT   Report Status PENDING   Incomplete  WOUND CULTURE     Status: None   Collection Time    08/05/12  4:18 PM      Result Value Range Status   Specimen Description WOUND FOOT LEFT   Final   Special Requests NONE   Final   Gram Stain     Final   Value: ABUNDANT WBC PRESENT, PREDOMINANTLY PMN     NO SQUAMOUS EPITHELIAL CELLS SEEN     ABUNDANT GRAM NEGATIVE RODS     ABUNDANT GRAM POSITIVE COCCI     IN PAIRS   Culture     Final   Value: ABUNDANT PSEUDOMONAS AERUGINOSA     MODERATE METHICILLIN RESISTANT STAPHYLOCOCCUS AUREUS     Note: RIFAMPIN AND GENTAMICIN  SHOULD NOT BE USED AS SINGLE DRUGS FOR TREATMENT OF STAPH INFECTIONS. This organism DOES NOT demonstrate inducible Clindamycin resistance in vitro. CRITICAL RESULT CALLED TO, READ BACK BY AND VERIFIED WITH: SILVIA HOWELL      08/10/12 AT 845 AM BY [facility]   Report Status 08/10/2012 FINAL   Final   Organism ID, Bacteria PSEUDOMONAS AERUGINOSA   Final   Organism ID, Bacteria METHICILLIN RESISTANT STAPHYLOCOCCUS AUREUS   Final    Medical History: Past Medical History  Diagnosis Date  . Hypertension     Medications:  Home: allopurinol, clobetasol cream, cutivate cream, indomethacin prn, losartan, tramadol verapamil  Assessment: 79 y.o. male presents with worsening bilateral lower leg ulcerations with purulent drainage. To begin vancomycin and zosyn.  Trough came back at 12 so with in goal (4.13.14)  Wound culture positive for MRSA and Pseudomonas.  Blood culture negative to date  Goal of Therapy:  Vancomycin trough level 10-15 mcg/ml  Plan:  1. Cont vanc 1500mg  IV q24h. 2. Zosyn 3.375gm IV q8h. Each dose to be infused over 4 hours 3. Narrow antibiotics - po doxycycline or Septra and Cipro  Thank you. Anette Guarneri, PharmD [phone]

## 2012-08-10 NOTE — Clinical Social Work Placement (Addendum)
Clinical Social Work Department  CLINICAL SOCIAL WORK PLACEMENT NOTE  4.14.14  Patient: [name] [name] Account Number: [id number] Admit date: 08/06/12  Clinical Social Worker: Rhea Pink LCSWA Date/time: 08/09/2012 3:30 AM  Clinical Social Work is seeking post-discharge placement for this patient at the following level of care: SKILLED NURSING (*CSW will update this form in Epic as items are completed)  08/09/12 Patient/family provided with [place] Department of Clinical Social Work's list of facilities offering this level of care within the geographic area requested by the patient (or if unable, by the patient's family).  08/09/12 Patient/family informed of their freedom to choose among providers that offer the needed level of care, that participate in Medicare, Medicaid or managed care program needed by the patient, have an available bed and are willing to accept the patient.  08/09/12 Patient/family informed of [facility]' ownership interest in [facility], as well as of the fact that they are under no obligation to receive care at this facility.  PASARR submitted to EDS on 08/09/12 PASARR number received from EDS on 08/09/12 FL2 transmitted to all facilities in geographic area requested by pt/family on 08/09/12  FL2 transmitted to all facilities within larger geographic area on  Patient informed that his/her managed care company has contracts with or will negotiate with certain facilities, including the following:  Patient/family informed of bed offers received:08/11/12 Patient chooses bed at [facility] Physician recommends and patient chooses bed at  Patient to be transferred to on 08/13/2012 Patient to be transferred to facility by [facility]  The following physician request were entered in Epic:  Additional Comments:

## 2012-08-11 ENCOUNTER — Inpatient Hospital Stay (HOSPITAL_COMMUNITY): Payer: Medicare Other

## 2012-08-11 DIAGNOSIS — N189 Chronic kidney disease, unspecified: Secondary | ICD-10-CM

## 2012-08-11 LAB — CULTURE, BLOOD (ROUTINE X 2): Culture: NO GROWTH

## 2012-08-11 LAB — GLUCOSE, CAPILLARY
Glucose-Capillary: 99 mg/dL (ref 70–99)
Glucose-Capillary: 115 mg/dL — ABNORMAL HIGH (ref 70–99)
Glucose-Capillary: 128 mg/dL — ABNORMAL HIGH (ref 70–99)
Glucose-Capillary: 132 mg/dL — ABNORMAL HIGH (ref 70–99)

## 2012-08-11 MED ORDER — GADOBENATE DIMEGLUMINE 529 MG/ML IV SOLN
20.0000 mL | Freq: Once | INTRAVENOUS | Status: AC
  Administered 2012-08-11: 20 mL via INTRAVENOUS

## 2012-08-11 NOTE — Progress Notes (Signed)
Hydrotherapy Note  Pt with incr pain over Lt 5th metatarsal head. This area of wound has consistently produced purulent drainage and has become 1.5-2.0 cm deep. Overlying skin has necrosed and there is an area of darkened/blackish area of skin below this. Noted x-ray of this area 08/05/12 could not rule out osteomyelitis. ? If MRI is indicated to assess presence of osteomyelitis.   Full hydrotherapy note to follow.   08/11/2012 [name], PT Pager: [phone]

## 2012-08-11 NOTE — Progress Notes (Signed)
Physical Therapy Wound Treatment Patient Details  Name: [name] [name] MRN: [id number] Date of Birth: November 19, 1926  Today's Date: 08/11/2012 Time: [id number] Time Calculation (min): 36 min  Wound on dorsum of foot with incr granulation, however with deeper area along lateral aspect. Pt with incr tenderness at this area and with darkened area (not open) below the wound. Noted x-ray of foot could not rule out osteomyelitis and recommended MRI to further assess for osteo. ? if MRI now indicated.  Subjective  Patient and Family Stated Goals: to save my legs Prior Treatments: soaked his legs at home; hydrocortisone cream  Pain Score:   Pt unable to rate--states it was a 10 when he came into the hospital and is less than that now. Requested stopping PLS treatment for breaks twice today (has not needed previously). Also had premedication for pain today (for the first time). Clearly more painful.  Wound Assessment  Wound 08/05/12 Other (Comment) Foot Left base 5th metatarsal (Active)  Site / Wound Assessment Black;Brown;Granulation tissue;Pale;Red;Yellow 08/11/2012 10:22 AM  % Wound base Red or Granulating 40% 08/11/2012 10:22 AM  % Wound base Yellow 55% 08/11/2012 10:22 AM  % Wound base Black 5% 08/11/2012 10:22 AM  % Wound base Other (Comment) 0% 08/11/2012 10:22 AM  Peri-wound Assessment Intact;Black;Induration 08/11/2012 10:22 AM  Wound Length (cm) 2.3 cm 08/06/2012  3:24 PM  Wound Width (cm) 2.5 cm 08/06/2012  3:24 PM  Wound Depth (cm) 0.7 cm 08/06/2012  3:24 PM  Margins Unattacted edges (unapproximated) 08/11/2012 10:22 AM  Closure None 08/11/2012 10:22 AM  Drainage Amount Moderate 08/11/2012 10:22 AM  Drainage Description Purulent;Odor 08/11/2012 10:22 AM  Non-staged Wound Description Full thickness 08/11/2012 10:22 AM  Treatment Debridement (Selective);Hydrotherapy (Pulse lavage) 08/11/2012 10:22 AM  Dressing Type Silver hydrofiber;Gauze (Comment);Compression wrap 08/11/2012 10:22 AM  Dressing Changed  Changed 08/11/2012 10:22 AM  Dressing Status Clean;Dry;Intact 08/11/2012 10:22 AM      Hydrotherapy Pulsed lavage therapy - wound location: Lt foot Pulsed Lavage with Suction (psi):  (4-8) Pulsed Lavage with Suction - Normal Saline Used: 1000 mL Pulsed Lavage Tip: Tip with splash shield Selective Debridement Selective Debridement - Location: Lt foot Selective Debridement - Tools Used: Forceps;Scissors Selective Debridement - Tissue Removed: yellow and black eschar   Wound Assessment and Plan  Wound Therapy - Assess/Plan/Recommendations Wound Therapy - Clinical Statement: Wound on dorsum of foot with incr granulation, however with deeper area along lateral aspect. Pt with incr tenderness at this area and with darkened area (not open) below the wound. Noted x-ray of foot could not rule out osteomyelitis and recommended MRI to further assess for osteo. ? if MRI now indicated. Wound Therapy - Functional Problem List: limited mobility due to edematous, weeping legs Factors Delaying/Impairing Wound Healing: Diabetes Mellitus;Infection - systemic/local;Immobility;Multiple medical problems Hydrotherapy Plan: Debridement;Patient/family education;Pulsatile lavage with suction Wound Therapy - Frequency: 6X / week Wound Therapy - Follow Up Recommendations: Skilled nursing facility  Wound Therapy Goals- Improve the function of patient's integumentary system by progressing the wound(s) through the phases of wound healing (inflammation - proliferation - remodeling) by: Decrease Necrotic Tissue to: left foot <50% Decrease Necrotic Tissue - Progress: Progressing toward goal Increase Granulation Tissue to: Lt foot to >50% Increase Granulation Tissue - Progress: Progressing toward goal Improve Drainage Characteristics: Min Improve Drainage Characteristics - Progress: Progressing toward goal Patient/Family will be able to : verbalize understanding of ways he may contribute to his legs healing (elevation,  nutrition, pressure relief). Patient/Family Instruction Goal - Progress: Progressing toward goal  Goals will be updated until maximal potential achieved or discharge criteria met.  Discharge criteria: when goals achieved, discharge from hospital, MD decision/surgical intervention, no progress towards goals, refusal/missing three consecutive treatments without notification or medical reason.  GP     Darleen Moffitt 08/11/2012, 10:29 AM

## 2012-08-11 NOTE — Progress Notes (Addendum)
[facility] PROGRESS NOTE  [name] [name] [id number] DOB: 28-Jun-1926 DOA: 08/05/2012 PCP: No PCP Per Patient  Assessment/Plan: 1-Bilateral LE lymphadenitis/abscess/cellulitis: concern for long term infection of his skin. Changes and discharge demonstrating infection present for many months.  -will keep on IV antibiotics until results from MRI are back; if negative will place on doxycycline for another 2-3 weeks and continue wound care. -ABI difficult to interpreted due to ongoing swelling; but signal on his toes guarantee good perfusion for healing.  -x-ray results of left foot wound not demonstrating osteomyelitis; but unfortunately not ruling it out either. Due to changes appreciated on physical exam, will get MRI of his foot to r/o osteomyelitis and depending results will involve orthopedic service. -PRN analgesics  -Cont PT for hydrotherapy -diabetic state most likely contributing, but not the main etiology of his wounds.  2-HTN (hypertension): cozaar discontinued at this moment, given renal failure (acute on chronic). Will continue verapamil and bidil.  -low sodium diet -also low dose lasix   3-SOB: intermittent. OHS might be contributing. 2-D echo demonstrating grade 2 diastolic dysfunction. -BNP elevated at 654 -continue heart healthy diet -continue low dose lasix   4-Renal insufficiency/failure: acute on chronic (baseline per labs was stage 2 in 2011); now GFR in the low 40's.  -follow BMET and Cr trend; slowly continue improving -UA without UTI and renal US WNL -could be just progression of hypertensive/diabetic nephropathy overtime -Vancomycin potentially also contributing; pharmacy to adjust dose. -Last Cr 1.57; will get BMET in am  5-Tobacco abuse: couseniling provided. Started on nicotine patch.   6-Gout: continue allopurinol; no acute flare  7-DM type 2: with A1C of 6.9; will continue treatment with SSI while in the hospital and at discharge will use glipizide or  gliburide. Metformin not first choice given renal failure. -continue low carb diet.  8-GERD: continue PPI   9-Grade 2 diastolic dysfunction with mild exacerbation: improved and now compensated; no further SOB sensation after lasix started. -continue close Intake and output, and daily weight. -low sodium diet -low dose lasix daily -continue controlling HTN.  DVT: heparin.   Code Status: Full Family Communication: Daughter by phone Kathlene November); care discussed with patient. Disposition Plan: Need SNF for further treatment of his wounds; prior to admission living home alone and w/o much support.   Consultants:  PT  Wound care service  Procedures:  See below for x-ray reports  Hydrotherapy by PT  Antibiotics:  vanc and zosyn  HPI/Subjective: Afebrile; No CP or SOB. Patient with left foot ulcer continuously draining pus, deep (2.0 CM) and darkened/necrosed skin around wound.  Objective: Filed Vitals:   08/10/12 0524 08/10/12 1300 08/10/12 2143 08/11/12 0528  BP: 136/72 149/78 143/72 136/77  Pulse: 89 103 83 84  Temp: 99.2 F (37.3 C) 100 F (37.8 C) 98.3 F (36.8 C) 98.8 F (37.1 C)  TempSrc:      Resp: 18 18 18 18   Height:      Weight:      SpO2: 92% 94% 95% 99%    Intake/Output Summary (Last 24 hours) at 08/11/12 0937 Last data filed at 08/11/12 0530  Gross per 24 hour  Intake   2130 ml  Output   1100 ml  Net   1030 ml   Filed Weights   08/06/12 1100  Weight: 113.399 kg (250 lb)    Exam:   General:  Afebrile; no SOB, reports pain on his LE is getting better.  Cardiovascular: S1 and S2, no rubs or gallops  Respiratory: decreased BS at bases  Abdomen: soft, NT, ND, positive BS  Musculoskeletal and skin: bilateral LE edema/induration/erythema; also with positive left foot open ulcer and multiple bilateral small malodorous open wounds/small abscess (improving)  Data Reviewed: Basic Metabolic Panel:  Recent Labs Lab 08/05/12 1246  08/05/12 2031 08/06/12 0650 08/07/12 0725 08/08/12 0554 08/09/12 0613  NA 140  --  136 135 135 136  K 4.1  --  3.8 3.9 3.6 3.6  CL 102  --  101 101 100 100  CO2 28  --  27 25 24 27   GLUCOSE 137*  --  123* 107* 116* 121*  BUN 12  --  13 13 13 12   CREATININE 1.39* 1.36* 1.54* 1.61* 1.55* 1.57*  CALCIUM 9.8  --  8.7 8.7 8.6 8.7  MG  --  2.1  --   --   --   --    Liver Function Tests:  Recent Labs Lab 08/05/12 1246  AST 25  ALT 8  ALKPHOS 49  BILITOT 0.4  PROT 8.1  ALBUMIN 3.1*   CBC:  Recent Labs Lab 08/05/12 1246 08/05/12 2031 08/06/12 0650 08/09/12 0613  WBC 5.3 6.7 6.4 6.0  NEUTROABS 3.6  --   --   --   HGB 12.1* 11.0* 9.8* 9.9*  HCT 35.4* 32.8* 29.3* 28.7*  MCV 92.4 91.4 91.0 90.3  PLT 304 284 260 271   BNP (last 3 results)  Recent Labs  08/05/12 1627  PROBNP 654.3*    Recent Results (from the past 240 hour(s))  CULTURE, BLOOD (ROUTINE X 2)     Status: None   Collection Time    08/05/12  3:45 PM      Result Value Range Status   Specimen Description BLOOD LEFT ANTECUBITAL   Final   Special Requests BOTTLES DRAWN AEROBIC AND ANAEROBIC 10CC   Final   Culture  Setup Time 08/05/2012 23:53   Final   Culture NO GROWTH 5 DAYS   Final   Report Status 08/11/2012 FINAL   Final  CULTURE, BLOOD (ROUTINE X 2)     Status: None   Collection Time    08/05/12  3:55 PM      Result Value Range Status   Specimen Description BLOOD HAND LEFT   Final   Special Requests BOTTLES DRAWN AEROBIC ONLY 10CC   Final   Culture  Setup Time 08/05/2012 23:52   Final   Culture NO GROWTH 5 DAYS   Final   Report Status 08/11/2012 FINAL   Final  WOUND CULTURE     Status: None   Collection Time    08/05/12  4:18 PM      Result Value Range Status   Specimen Description WOUND FOOT LEFT   Final   Special Requests NONE   Final   Gram Stain     Final   Value: ABUNDANT WBC PRESENT, PREDOMINANTLY PMN     NO SQUAMOUS EPITHELIAL CELLS SEEN     ABUNDANT GRAM NEGATIVE RODS     ABUNDANT  GRAM POSITIVE COCCI     IN PAIRS   Culture     Final   Value: ABUNDANT PSEUDOMONAS AERUGINOSA     MODERATE METHICILLIN RESISTANT STAPHYLOCOCCUS AUREUS     Note: RIFAMPIN AND GENTAMICIN SHOULD NOT BE USED AS SINGLE DRUGS FOR TREATMENT OF STAPH INFECTIONS. This organism DOES NOT demonstrate inducible Clindamycin resistance in vitro. CRITICAL RESULT CALLED TO, READ BACK BY AND VERIFIED WITH: SILVIA HOWELL  08/10/12 AT 845 AM BY [facility]   Report Status 08/10/2012 FINAL   Final   Organism ID, Bacteria PSEUDOMONAS AERUGINOSA   Final   Organism ID, Bacteria METHICILLIN RESISTANT STAPHYLOCOCCUS AUREUS   Final     Studies: No results found. Scheduled Meds: . allopurinol  300 mg Oral Daily  . aspirin EC  81 mg Oral Daily  . docusate sodium  100 mg Oral BID  . furosemide  20 mg Oral Daily  . heparin  5,000 Units Subcutaneous Q8H  . insulin aspart  0-5 Units Subcutaneous QHS  . insulin aspart  0-9 Units Subcutaneous TID WC  . isosorbide-hydrALAZINE  1 tablet Oral BID  . nicotine  14 mg Transdermal Daily  . pantoprazole  40 mg Oral Q1200  . piperacillin-tazobactam (ZOSYN)  IV  3.375 g Intravenous Q8H  . polyethylene glycol  17 g Oral Daily  . vancomycin  1,500 mg Intravenous Q24H  . verapamil  180 mg Oral Daily   Continuous Infusions:   Principal Problem:   Lymphadenitis Active Problems:   HTN (hypertension)   Renal insufficiency   Tobacco abuse   Gout   Hyperglycemia   Cellulitis   Time spent: >30 minutes  Rhia Blatchford  [facility] Pager [phone]. If 7PM-7AM, please contact night-coverage at [email or web address], password [facility] 08/11/2012, 9:37 AM  LOS: 6 days

## 2012-08-12 ENCOUNTER — Encounter (HOSPITAL_COMMUNITY): Payer: Self-pay | Admitting: General Practice

## 2012-08-12 LAB — BASIC METABOLIC PANEL
Calcium: 8.9 mg/dL (ref 8.4–10.5)
Creatinine, Ser: 1.4 mg/dL — ABNORMAL HIGH (ref 0.50–1.35)
GFR calc Af Amer: 52 mL/min — ABNORMAL LOW (ref >90)

## 2012-08-12 LAB — GLUCOSE, CAPILLARY
Glucose-Capillary: 104 mg/dL — ABNORMAL HIGH (ref 70–99)
Glucose-Capillary: 132 mg/dL — ABNORMAL HIGH (ref 70–99)

## 2012-08-12 LAB — CBC
Platelets: 355 10*3/uL (ref 150–400)
RDW: 13.7 % (ref 11.5–15.5)
WBC: 6.1 10*3/uL (ref 4.0–10.5)

## 2012-08-12 MED ORDER — PNEUMOCOCCAL VAC POLYVALENT 25 MCG/0.5ML IJ INJ
0.5000 mL | INJECTION | INTRAMUSCULAR | Status: AC
  Administered 2012-08-12: 0.5 mL via INTRAMUSCULAR

## 2012-08-12 NOTE — Progress Notes (Signed)
ANTIBIOTIC CONSULT NOTE  Pharmacy Consult for Vancomycin and Zosyn Indication: Bilateral lower extremity ulcers   No Known Allergies  Labs:  Recent Labs  08/12/12 0442  WBC 6.1  HGB 10.5*  PLT 355  CREATININE 1.40*   Estimated Creatinine Clearance: 50.4 ml/min (by C-G formula based on Cr of 1.4). No results found for this basename: VANCOTROUGH, VANCOPEAK, VANCORANDOM, GENTTROUGH, GENTPEAK, GENTRANDOM, TOBRATROUGH, TOBRAPEAK, TOBRARND, AMIKACINPEAK, AMIKACINTROU, AMIKACIN,  in the last 72 hours    Medications:  Home: allopurinol, clobetasol cream, cutivate cream, indomethacin prn, losartan, tramadol verapamil  Assessment: 79 y.o. male presents with worsening bilateral lower leg ulcerations with purulent drainage. To begin vancomycin and zosyn.  Trough came back at 12 so with in goal (4.13.14)  Wound culture positive for MRSA and Pseudomonas.  Blood culture negative to date (final)  Goal of Therapy:  Vancomycin trough level 10-15 mcg/ml  Plan:  1. Cont vanc 1500mg  IV q24h. 2. Zosyn 3.375gm IV q8h. Each dose to be infused over 4 hours 3. Narrow antibiotics - po doxycycline or Septra and Cipro  Thank you. Anette Guarneri, PharmD [phone]

## 2012-08-12 NOTE — Progress Notes (Signed)
Physical Therapy Treatment Patient Details Name: [name] [name] MRN: [id number] DOB: 1926/12/01 Today's Date: 08/12/2012 Time: [id number] PT Time Calculation (min): 28 min  PT Assessment / Plan / Recommendation Comments on Treatment Session  Pt reports he has always walked bent over "even before I was an old man." Educated on safety risks of pushing RW too far ahead of his feet, yet pt persisted. No LOB during session.    Follow Up Recommendations  Prior Functioning  Home Living Lives With: Alone Available Help at Discharge: [place]: Straight cane;Wheelchair - powered Additional Comments: Daughter checks in on him daily.      SNF;Supervision/Assistance - 24 hour     Does the patient have the potential to tolerate intense rehabilitation     Barriers to Discharge        Equipment Recommendations  Rolling walker with 5" wheels    Recommendations for Other Services OT consult  Frequency Min 2X/week   Plan Discharge plan remains appropriate;Frequency remains appropriate (SNF for hydro to Lt foot and continued PT)    Precautions / Restrictions Precautions Precautions: Fall   Pertinent Vitals/Pain Dyspnea limiting gait to 45 ft (pt attempting to limit weight-bearing on Lt foot due to wound)    Mobility  Bed Mobility Bed Mobility: Rolling Left;Left Sidelying to Sit;Sitting - Scoot to Edge of Bed Rolling Left: 6: Modified independent (Device/Increase time);With rail Left Sidelying to Sit: 6: Modified independent (Device/Increase time);With rails;HOB elevated Sitting - Scoot to Edge of Bed: 7: Independent Transfers Transfers: Sit to Stand;Stand to Sit Sit to Stand: 4: Min guard;5: Supervision Stand to Sit: 4: Min assist Details for Transfer Assistance: x2; vc for safe use of RW; pt with uncontrolled descent and not putting hands on surface prior to sitting. Ambulation/Gait Ambulation/Gait Assistance: 4: Min assist Ambulation Distance  (Feet): 45 Feet (did not leave room due to possible drainage from Lt foot) Assistive device: Rolling walker Ambulation/Gait Assistance Details: vc for upright posture (tends to flex at trunk, hips, knees); vc and assist for maneuvering RW Gait Pattern: Step-to pattern;Decreased stride length;Wide base of support;Trunk flexed    Exercises     PT Diagnosis:    PT Problem List:   PT Treatment Interventions:     PT Goals Acute Rehab PT Goals Pt will go Supine/Side to Sit: with modified independence PT Goal: Supine/Side to Sit - Progress: Partly met Pt will go Sit to Stand: with modified independence PT Goal: Sit to Stand - Progress: Progressing toward goal Pt will go Stand to Sit: with modified independence PT Goal: Stand to Sit - Progress: Progressing toward goal Pt will Ambulate: 16 - 50 feet;with modified independence;with least restrictive assistive device PT Goal: Ambulate - Progress: Progressing toward goal  Visit Information  Last PT Received On: 08/12/12 Assistance Needed: +1    Subjective Data  Subjective: Reports his power w/c can elevate his lower legs ~45 degrees. Patient Stated Goal: ultimately return home with daughter checking on him daily   Cognition  Cognition Arousal/Alertness: Awake/alert Behavior During Therapy: WFL for tasks assessed/performed Overall Cognitive Status: Within Functional Limits for tasks assessed    Balance  Static Standing Balance Static Standing - Balance Support: Bilateral upper extremity supported;During functional activity Static Standing - Level of Assistance: 5: Stand by assistance  End of Session PT - End of Session Equipment Utilized During Treatment: Gait belt Activity Tolerance: Patient limited by fatigue (incr dyspnea and requesting to sit down) Patient left: in chair;with call bell/phone within reach  GP     [name] [name] 08/12/2012, 2:50 PM  08/12/2012 [name] [name], PT Pager: [phone]

## 2012-08-12 NOTE — Progress Notes (Signed)
[facility] PROGRESS NOTE  [name] [name] [id number] DOB: 10/31/1926 DOA: 08/05/2012 PCP: No PCP Per Patient  Assessment/Plan: 1-Bilateral LE lymphadenitis/abscess/cellulitis: concern for long term infection of his skin. Changes and discharge demonstrating infection present for many months.  -IV antibiotics 4/10>>>4/17.  Continue Doxycycline 100 bid for 3 more weeks -x-ray results of left foot wound not demonstrating osteomyelitis; but unfortunately not ru ling it out either. Due to changes appreciated on physical exam, MRI of his foot to ruled out osteomyelitis -Cont PT for hydrotherapy -diabetic state most likely contributing, but not the main etiology of his wounds. -will reassess wounds am  2-HTN (hypertension): cozaar discontinued at this moment, given renal failure (acute on chronic). Will continue verapamil and bidil.  -low sodium diet -also low dose lasix   3-SOB: intermittent. OHS might be contributing. 2-D echo demonstrating grade 2 diastolic dysfunction. -BNP elevated at 654 -continue heart healthy diet -continue low dose lasix   4-Renal insufficiency/failure: acute on chronic (baseline per labs was stage 2 in 2011); now GFR in the low 40's.  -follow BMET and Cr trend; slowly continue improving -UA without UTI and renal US WNL -could be just progression of hypertensive/diabetic nephropathy overtime -Vancomycin potentially also contributing; pharmacy to adjust dose. -Last Cr 1.57; showing downward tredn now-Rpt am  5-Tobacco abuse: couseniling provided. Started on nicotine patch.   6-Gout: continue allopurinol; no acute flare  7-DM type 2: with A1C of 6.9; will continue treatment with SSI while in the hospital and at discharge will use glipizide or gliburide. Metformin not first choice given renal failure. -continue low carb diet.  8-GERD: continue PPI   9-Grade 2 diastolic dysfunction with mild exacerbation: improved and now compensated; no further SOB sensation  after lasix started. -continue close Intake and output, and daily weight. -low sodium diet -low dose lasix 20 daily, BIDIL bid, Verapamil 180 daily -continue controlling HTN.  DVT: heparin.   Code Status: Full Family Communication: Daughter by phone Kathlene November); care discussed with patient. Disposition Plan: Need SNF for further treatment of his wounds; prior to admission living home alone and w/o much support.   Consultants:  PT  Wound care service  Procedures:  See below for x-ray reports  Hydrotherapy by PT  Antibiotics:  vanc and zosyn  HPI/Subjective: Afebrile; No CP or SOB. No other issues currently.  Had Hydrotherapy today    Objective: Filed Vitals:   08/11/12 1300 08/11/12 2131 08/12/12 0530 08/12/12 0959  BP: 110/67 133/81 122/63 122/70  Pulse: 88 81 82   Temp: 98 F (36.7 C) 98.1 F (36.7 C) 98.7 F (37.1 C)   TempSrc:      Resp: 18 18 18    Height:      Weight:      SpO2: 97% 98% 97%     Intake/Output Summary (Last 24 hours) at 08/12/12 1821 Last data filed at 08/12/12 1736  Gross per 24 hour  Intake    610 ml  Output   1200 ml  Net   -590 ml   Filed Weights   08/06/12 1100  Weight: 113.399 kg (250 lb)    Exam:   General:  Afebrile; no SOB, reports pain on his LE is getting better.  Cardiovascular: S1 and S2, no rubs or gallops  Respiratory: decreased BS at bases  Abdomen: soft, NT, ND, positive BS   Data Reviewed: Basic Metabolic Panel:  Recent Labs Lab 08/05/12 2031 08/06/12 0650 08/07/12 0725 08/08/12 0554 08/09/12 [id number] 08/12/12 0442  NA  --  136 135 135 136 133*  K  --  3.8 3.9 3.6 3.6 3.8  CL  --  101 101 100 100 98  CO2  --  27 25 24 27 27   GLUCOSE  --  123* 107* 116* 121* 107*  BUN  --  13 13 13 12 10   CREATININE 1.36* 1.54* 1.61* 1.55* 1.57* 1.40*  CALCIUM  --  8.7 8.7 8.6 8.7 8.9  MG 2.1  --   --   --   --   --    Liver Function Tests: No results found for this basename: AST, ALT, ALKPHOS, BILITOT,  PROT, ALBUMIN,  in the last 168 hours CBC:  Recent Labs Lab 08/05/12 2031 08/06/12 0650 08/09/12 0613 08/12/12 0442  WBC 6.7 6.4 6.0 6.1  HGB 11.0* 9.8* 9.9* 10.5*  HCT 32.8* 29.3* 28.7* 30.5*  MCV 91.4 91.0 90.3 90.0  PLT 284 260 271 355   BNP (last 3 results)  Recent Labs  08/05/12 1627  PROBNP 654.3*    Recent Results (from the past 240 hour(s))  CULTURE, BLOOD (ROUTINE X 2)     Status: None   Collection Time    08/05/12  3:45 PM      Result Value Range Status   Specimen Description BLOOD LEFT ANTECUBITAL   Final   Special Requests BOTTLES DRAWN AEROBIC AND ANAEROBIC 10CC   Final   Culture  Setup Time 08/05/2012 23:53   Final   Culture NO GROWTH 5 DAYS   Final   Report Status 08/11/2012 FINAL   Final  CULTURE, BLOOD (ROUTINE X 2)     Status: None   Collection Time    08/05/12  3:55 PM      Result Value Range Status   Specimen Description BLOOD HAND LEFT   Final   Special Requests BOTTLES DRAWN AEROBIC ONLY 10CC   Final   Culture  Setup Time 08/05/2012 23:52   Final   Culture NO GROWTH 5 DAYS   Final   Report Status 08/11/2012 FINAL   Final  WOUND CULTURE     Status: None   Collection Time    08/05/12  4:18 PM      Result Value Range Status   Specimen Description WOUND FOOT LEFT   Final   Special Requests NONE   Final   Gram Stain     Final   Value: ABUNDANT WBC PRESENT, PREDOMINANTLY PMN     NO SQUAMOUS EPITHELIAL CELLS SEEN     ABUNDANT GRAM NEGATIVE RODS     ABUNDANT GRAM POSITIVE COCCI     IN PAIRS   Culture     Final   Value: ABUNDANT PSEUDOMONAS AERUGINOSA     MODERATE METHICILLIN RESISTANT STAPHYLOCOCCUS AUREUS     Note: RIFAMPIN AND GENTAMICIN SHOULD NOT BE USED AS SINGLE DRUGS FOR TREATMENT OF STAPH INFECTIONS. This organism DOES NOT demonstrate inducible Clindamycin resistance in vitro. CRITICAL RESULT CALLED TO, READ BACK BY AND VERIFIED WITH: SILVIA HOWELL      08/10/12 AT 845 AM BY [facility]   Report Status 08/10/2012 FINAL   Final   Organism ID,  Bacteria PSEUDOMONAS AERUGINOSA   Final   Organism ID, Bacteria METHICILLIN RESISTANT STAPHYLOCOCCUS AUREUS   Final     Studies: Mr Foot Left W Wo Contrast  08/12/2012  *RADIOLOGY REPORT*  Clinical Data: Multiple soft tissue foot ulcerations.  Cellulitis.  MRI OF THE LEFT FOREFOOT WITHOUT AND WITH CONTRAST  Technique:  Multiplanar, multisequence MR imaging was performed  both before and after administration of intravenous contrast.  Contrast: 86mL MULTIHANCE GADOBENATE DIMEGLUMINE 529 MG/ML IV SOLN  Comparison: Radiographs dated 08/05/2012  Findings: There is no evidence of osteomyelitis.  There is a soft tissue ulceration on the dorsum of the foot overlying the head of the fifth metatarsal but there is no abnormal edema or enhancement of the head of the fifth metatarsal.  There is no joint effusion or bone destruction.  The other bones demonstrate no significant abnormalities.  No significant abnormality of the tendons around the ankle.  No significant joint effusions.  IMPRESSION: Soft tissue ulcerations without evidence of abscesses or osteomyelitis or septic joints.   Original Report Authenticated By: Lorriane Shire, M.D.    Scheduled Meds: . allopurinol  300 mg Oral Daily  . aspirin EC  81 mg Oral Daily  . docusate sodium  100 mg Oral BID  . furosemide  20 mg Oral Daily  . heparin  5,000 Units Subcutaneous Q8H  . insulin aspart  0-5 Units Subcutaneous QHS  . insulin aspart  0-9 Units Subcutaneous TID WC  . isosorbide-hydrALAZINE  1 tablet Oral BID  . nicotine  14 mg Transdermal Daily  . pantoprazole  40 mg Oral Q1200  . piperacillin-tazobactam (ZOSYN)  IV  3.375 g Intravenous Q8H  . polyethylene glycol  17 g Oral Daily  . vancomycin  1,500 mg Intravenous Q24H  . verapamil  180 mg Oral Daily   Continuous Infusions:   Principal Problem:   Lymphadenitis Active Problems:   HTN (hypertension)   Renal insufficiency   Tobacco abuse   Gout   Hyperglycemia   Cellulitis   Time spent:  >30 minutes  Nita Sells  [facility] Pager [phone]. If 7PM-7AM, please contact night-coverage at [email or web address], password [facility] 08/12/2012, 6:21 PM  LOS: 7 days

## 2012-08-12 NOTE — Progress Notes (Signed)
Physical Therapy Wound Treatment Patient Details  Name: [name] [name] MRN: [id number] Date of Birth: 1926/06/15  Today's Date: 08/12/2012 Time: [id number] Time Calculation (min): 41 min  Clinical Statement: Wound on dorsum of foot continues to improve although also has become increasingly painful. (noted MRI negative for osteomyelitis). Less drainage, although still purulent. Small area of black necrosis still remains (pt has been too painful to tolerate debridement of this area even with pre-medication for pain). ? pt needs additional IV pain medicine prior to hydrotherapy?  Subjective  Patient and Family Stated Goals: to save my legs Prior Treatments: soaked his legs at home; hydrocortisone cream  Pain Score:   10/10 with attempts to debride black tissue  Wound Assessment  Wound 08/05/12 Other (Comment) Foot Left base 5th metatarsal (Active)  Site / Wound Assessment Black;Brown;Granulation tissue;Pale;Red;Yellow 08/12/2012 10:36 AM  % Wound base Red or Granulating 70% 08/12/2012 10:36 AM  % Wound base Yellow 25% 08/12/2012 10:36 AM  % Wound base Black 5% 08/12/2012 10:36 AM  % Wound base Other (Comment) 0% 08/12/2012 10:36 AM  Peri-wound Assessment Intact;Black;Induration 08/12/2012 10:36 AM  Wound Length (cm) 2.3 cm 08/06/2012  3:24 PM  Wound Width (cm) 2.5 cm 08/06/2012  3:24 PM  Wound Depth (cm) 0.7 cm 08/06/2012  3:24 PM  Margins Unattacted edges (unapproximated) 08/12/2012 10:36 AM  Closure None 08/12/2012 10:36 AM  Drainage Amount Minimal 08/12/2012 10:36 AM  Drainage Description Purulent;Odor 08/12/2012 10:36 AM  Non-staged Wound Description Full thickness 08/12/2012 10:36 AM  Treatment Debridement (Selective);Hydrotherapy (Pulse lavage) 08/12/2012 10:36 AM  Dressing Type Silver hydrofiber;Gauze (Comment);Compression wrap 08/12/2012 10:36 AM  Dressing Changed Changed 08/12/2012 10:36 AM  Dressing Status Clean;Dry;Intact 08/12/2012 10:36 AM      Hydrotherapy Pulsed lavage therapy - wound  location: Lt foot Pulsed Lavage with Suction (psi): 4 psi Pulsed Lavage with Suction - Normal Saline Used: 1000 mL Pulsed Lavage Tip: Tip with splash shield Selective Debridement Selective Debridement - Location: Lt foot Selective Debridement - Tools Used: Forceps;Scissors Selective Debridement - Tissue Removed: yellow and black eschar   Wound Assessment and Plan  Wound Therapy - Assess/Plan/Recommendations Wound Therapy - Clinical Statement: Wound on dorsum of foot continues to improve although also has become increasingly painful. (noted MRI negative for osteomyelitis). Less drainage, although still purulent. Small area of black necrosis still remains (pt has been too painful to tolerate debridement of this area even with pre-medication for pain). ? pt needs additional IV pain medicine prior to hydrotherapy? Wound Therapy - Functional Problem List: limited mobility due to edematous, weeping legs Factors Delaying/Impairing Wound Healing: Diabetes Mellitus;Infection - systemic/local;Immobility;Multiple medical problems Hydrotherapy Plan: Debridement;Patient/family education;Pulsatile lavage with suction Wound Therapy - Frequency: 6X / week Wound Therapy - Follow Up Recommendations: Skilled nursing facility  Wound Therapy Goals- Improve the function of patient's integumentary system by progressing the wound(s) through the phases of wound healing (inflammation - proliferation - remodeling) by: Decrease Necrotic Tissue to: left foot <50% Decrease Necrotic Tissue - Progress: Met Increase Granulation Tissue to: Lt foot to >50% Improve Drainage Characteristics: Min Improve Drainage Characteristics - Progress: Partly met Patient/Family will be able to : verbalize understanding of ways he may contribute to his legs healing (elevation, nutrition, pressure relief). Patient/Family Instruction Goal - Progress: Progressing toward goal  Goals will be updated until maximal potential achieved or  discharge criteria met.  Discharge criteria: when goals achieved, discharge from hospital, MD decision/surgical intervention, no progress towards goals, refusal/missing three consecutive treatments without notification or medical reason.  Ansley Mangiapane 08/12/2012, 10:43 AM   08/12/2012 [name], PT Pager: [phone]

## 2012-08-13 LAB — GLUCOSE, CAPILLARY: Glucose-Capillary: 107 mg/dL — ABNORMAL HIGH (ref 70–99)

## 2012-08-13 MED ORDER — ISOSORB DINITRATE-HYDRALAZINE 20-37.5 MG PO TABS: 1.0000 | ORAL_TABLET | Freq: Two times a day (BID) | ORAL | Status: DC

## 2012-08-13 MED ORDER — FUROSEMIDE 20 MG PO TABS: 20.0000 mg | ORAL_TABLET | Freq: Every day | ORAL | Status: DC

## 2012-08-13 MED ORDER — DOXYCYCLINE HYCLATE 100 MG PO TABS: 100.0000 mg | ORAL_TABLET | Freq: Two times a day (BID) | ORAL | Status: DC

## 2012-08-13 MED ORDER — DOXYCYCLINE HYCLATE 100 MG PO TABS
100.0000 mg | ORAL_TABLET | Freq: Two times a day (BID) | ORAL | Status: DC
  Administered 2012-08-13: 100 mg via ORAL
  Filled 2012-08-13 (×2): qty 1

## 2012-08-13 MED ORDER — GLYBURIDE 2.5 MG PO TABS: 2.5000 mg | ORAL_TABLET | Freq: Every day | ORAL | Status: DC

## 2012-08-13 NOTE — Progress Notes (Addendum)
SW attempted to contact patient's daughter 7x to inform her of patient's discharge. SW was unable to speak with daughter or leave a message.  Rhea Pink, MSW,  [phone]

## 2012-08-13 NOTE — Progress Notes (Signed)
Physical Therapy Wound Treatment Patient Details  Name: Major Christakos MRN: [id number] Date of Birth: May 22, 1926  Today's Date: 08/13/2012 Time: 0912-0955 Time Calculation (min): 43 min  Subjective  Patient and Family Stated Goals: to save my legs Prior Treatments: soaked his legs at home; hydrocortisone cream  Pain Score:   "feels better" much less discomfort compared to previous two days  Wound Assessment  Wound 08/05/12 Other (Comment) Foot Left base 5th metatarsal (Active)  Site / Wound Assessment Brown;Granulation tissue;Red;Yellow 08/13/2012 10:55 AM  % Wound base Red or Granulating 80% 08/13/2012 10:55 AM  % Wound base Yellow 20% 08/13/2012 10:55 AM  % Wound base Black 0% 08/13/2012 10:55 AM  % Wound base Other (Comment) 0% 08/13/2012 10:55 AM  Peri-wound Assessment Intact;Induration;Other (Comment) 08/13/2012 10:55 AM  Wound Length (cm) 2.4 cm 08/13/2012 10:55 AM  Wound Width (cm) 3.4 cm 08/13/2012 10:55 AM  Wound Depth (cm) 0.8 cm 08/13/2012 10:55 AM  Margins Unattacted edges (unapproximated) 08/13/2012 10:55 AM  Closure None 08/13/2012 10:55 AM  Drainage Amount Minimal 08/13/2012 10:55 AM  Drainage Description Serosanguineous;No odor 08/13/2012 10:55 AM  Non-staged Wound Description Full thickness 08/13/2012 10:55 AM  Treatment Debridement (Selective);Hydrotherapy (Pulse lavage) 08/13/2012 10:55 AM  Dressing Type Silver hydrofiber;Gauze (Comment);Compression wrap 08/13/2012 10:55 AM  Dressing Changed Changed 08/13/2012 10:55 AM  Dressing Status Clean;Dry;Intact 08/13/2012 10:55 AM      Hydrotherapy Pulsed lavage therapy - wound location: Lt foot Pulsed Lavage with Suction (psi):  (4-8) Pulsed Lavage with Suction - Normal Saline Used: 1000 mL Pulsed Lavage Tip: Tip with splash shield Selective Debridement Selective Debridement - Location: Lt foot Selective Debridement - Tools Used: Forceps;Scissors;Scalpel Selective Debridement - Tissue Removed: yellow and black eschar   Wound  Assessment and Plan  Wound Therapy - Assess/Plan/Recommendations Wound Therapy - Clinical Statement: Wound looks dramatically better today. No purulent drainage expressed from lateral edge of wound and depth improved to 0.8 cm. Pt also less painful.  Wound Therapy - Functional Problem List: limited mobility due to edematous, weeping legs Factors Delaying/Impairing Wound Healing: Diabetes Mellitus;Infection - systemic/local;Immobility;Multiple medical problems Hydrotherapy Plan: Debridement;Patient/family education;Pulsatile lavage with suction Wound Therapy - Frequency: 6X / week Wound Therapy - Follow Up Recommendations: Skilled nursing facility  Wound Therapy Goals- Improve the function of patient's integumentary system by progressing the wound(s) through the phases of wound healing (inflammation - proliferation - remodeling) by: Decrease Necrotic Tissue to: Left foot 0% Decrease Necrotic Tissue - Progress: Updated due to goal met Increase Granulation Tissue to: Lt foot to 100% Increase Granulation Tissue - Progress: Updated due to goal met Improve Drainage Characteristics: Min;Serous Improve Drainage Characteristics - Progress: Updated due to goal met Patient/Family will be able to : verbalize understanding of ways he may contribute to his legs healing (elevation, nutrition, pressure relief). Patient/Family Instruction Goal - Progress: Progressing toward goal Goals/treatment plan/discharge plan were made with and agreed upon by patient/family: Yes Time For Goal Achievement: 7 days Wound Therapy - Potential for Goals: Good  Goals will be updated until maximal potential achieved or discharge criteria met.  Discharge criteria: when goals achieved, discharge from hospital, MD decision/surgical intervention, no progress towards goals, refusal/missing three consecutive treatments without notification or medical reason.  GP     Daryana Whirley 08/13/2012, 11:05 AM   08/13/2012 [name],  PT Pager: [phone]

## 2012-08-13 NOTE — Progress Notes (Signed)
Clinical social worker assisted with patient discharge to skilled nursing facility, [facility].  CSW addressed all family questions and concerns. CSW copied chart and added all important documents. CSW also set up patient transportation with [employer] operational officer. Clinical Social Worker will sign off for now as social work intervention is no longer needed.  Rhea Pink, MSW [phone]

## 2012-08-13 NOTE — Discharge Summary (Signed)
Physician Discharge Summary  [name] [name] [id number] DOB: 04/06/1927 DOA: 08/05/2012  PCP: No PCP Per Patient  Admit date: 08/05/2012 Discharge date: 08/13/2012  Time spent: 45 minutes  Recommendations for Outpatient Follow-up:  1. Needs CBC and Bmet in about 1 week ( 2. Hydrotherapy recommended for aty least one more week to the RLE dorsal surface of foot over little toe 3. doxicycline to complete 08/27/12 4. Encourage tobacco cessation  5. Recommned A1c and Oral hpoglycemics if above 6.5-Would probably not use Metformin as he had some renal insufficiency in hospital  Discharge Diagnoses:  Principal Problem:   Lymphadenitis Active Problems:   HTN (hypertension)   Renal insufficiency   Tobacco abuse   Gout   Hyperglycemia   Cellulitis   Discharge Condition: Well  Diet recommendation:  Heart healthy  Filed Weights   08/06/12 1100  Weight: 113.399 kg (250 lb)    History of present illness:  Pleasant 79 year old male admitted 08/05/2012 with worsening swelling erythema and pain bilaterally in lower extremities shows no have purulent discharge and swelling-was thought initially that he may be at risk for deep seated infection  Hospital Course:  1-Bilateral LE lymphadenitis/abscess/cellulitis: concern for long term infection of his skin. Changes and discharge demonstrating infection present for many months.  -IV antibiotics 4/10>>>4/17. Continue Doxycycline 100 bid for 2 more weeks, stop date 5.2.14 -x-ray results of left foot wound not demonstrating osteomyelitis;  MRI of his foot to ruled out osteomyelitis -Cont PT for hydrotherapy  -diabetic state most likely contributing, but not the main etiology of his wounds.  -needs continue Hydrotherapy at least for one more week at nursing facility  2-HTN (hypertension): cozaar discontinued at this moment, given renal failure (acute on chronic). Will continue verapamil and bidil.  -low sodium diet  -also low dose lasix 20 milligram  daily. This will help decrease the is  3-SOB: intermittent. OHS might be contributing. 2-D echo demonstrating grade 2 diastolic dysfunction.  -BNP elevated at 654  -continue heart healthy diet   4-Renal insufficiency/failure: acute on chronic (baseline per labs was stage 2 in 2011); now GFR in the low 40's.  -follow BMET and Cr trend; slowly continue improving  -UA without UTI and renal US WNL  -could be just progression of hypertensive/diabetic nephropathy overtime  -Vancomycin potentially also contributing; pharmacy to adjust dose.  -Last Cr 1.57; showing downward trend on d/c-repeat basic metabolic panel as an  5-Tobacco abuse: counselling provided. Started on nicotine patch.   6-Gout: continue allopurinol; no acute flare   7-DM type 2: with A1C of 6.9; was on sliding scale insulin in the hospital. Sugars very well controlled while in hospital between 100  And 130 range.  Started glyburide 2.5 mg on discharge  8-GERD: continue PPI   9-Grade 2 diastolic dysfunction with mild exacerbation: improved and now compensated; no further SOB sensation after lasix started.  -continue close Intake and output, and daily weight.  -low sodium diet  -low dose lasix 20 daily, BIDIL bid, Verapamil 180 daily  -continue controlling HTN.   Procedures:  none   Consultations:  none  Discharge Exam: Filed Vitals:   08/12/12 0530 08/12/12 0959 08/12/12 2023 08/13/12 0659  BP: 122/63 122/70 131/91 141/74  Pulse: 82  76 82  Temp: 98.7 F (37.1 C)  98 F (36.7 C) 98 F (36.7 C)  TempSrc:      Resp: 18  16 16   Height:      Weight:      SpO2: 97%  100%  100%   Alert pleasant.  Getting Hydrotherapy.  No n/v tol diet well  General: alert Cardiovascular:  s1 s2 no m/r/ Respiratory: clear  Discharge Instructions  Discharge Orders   Future Orders Complete By Expires     Diet - low sodium heart healthy  As directed     Increase activity slowly  As directed         Medication List     STOP taking these medications       losartan 25 MG tablet  Commonly known as:  COZAAR      TAKE these medications       allopurinol 100 MG tablet  Commonly known as:  ZYLOPRIM  Take 100 mg by mouth daily.     allopurinol 300 MG tablet  Commonly known as:  ZYLOPRIM  Take 1 tablet (300 mg total) by mouth daily.     clobetasol cream 0.05 %  Commonly known as:  TEMOVATE  Apply 1 application topically 2 (two) times daily.     doxycycline 100 MG tablet  Commonly known as:  VIBRA-TABS  Take 1 tablet (100 mg total) by mouth every 12 (twelve) hours.     fluticasone 0.05 % cream  Commonly known as:  CUTIVATE  Apply 1 application topically 2 (two) times daily as needed. Apply to affected area of face twice daily as needed.     furosemide 20 MG tablet  Commonly known as:  LASIX  Take 1 tablet (20 mg total) by mouth daily.     glyBURIDE 2.5 MG tablet  Commonly known as:  DIABETA  Take 1 tablet (2.5 mg total) by mouth daily with breakfast.     indomethacin 25 MG capsule  Commonly known as:  INDOCIN  Take 25 mg by mouth 3 (three) times daily as needed (gout).     isosorbide-hydrALAZINE 20-37.5 MG per tablet  Commonly known as:  BIDIL  Take 1 tablet by mouth 2 (two) times daily.     traMADol 50 MG tablet  Commonly known as:  ULTRAM  Take 100 mg by mouth 2 (two) times daily.     verapamil 240 MG CR tablet  Commonly known as:  CALAN-SR  Take 240 mg by mouth daily.     verapamil 240 MG CR tablet  Commonly known as:  CALAN-SR  Take 1 tablet (240 mg total) by mouth at bedtime.          The results of significant diagnostics from this hospitalization (including imaging, microbiology, ancillary and laboratory) are listed below for reference.    Significant Diagnostic Studies: Dg Tibia/fibula Left  08/05/2012  *RADIOLOGY REPORT*  Clinical Data: Bilateral lower leg pain and skin ulcerations.  LEFT TIBIA AND FIBULA - 2 VIEW  Comparison: None.  Findings: Skin irregularity  consistent with ulcerations is noted. No soft tissue gas collection, radiopaque foreign body or bony destructive change is identified.  There is no knee joint effusion. 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 There appears to be an erosion of the first tarsometatarsal joint suggestive of gout.  IMPRESSION:  1.  Skin ulcerations without underlying acute bony or joint abnormality. 2.  Tibiotalar degenerative change. 3.  Question gout of the first tarsometatarsal joint.   Original Report Authenticated By: Orlean Patten, M.D.    Dg Tibia/fibula Right  08/05/2012  **ADDENDUM** CREATED: 08/05/2012 15:30:31  This addendum is given for the purpose of noting the patient has marked appearing tibiotalar degenerative disease.  **END ADDENDUM** SIGNED BY: Marcello Moores L. Luther Parody,  M.D.   08/05/2012  *RADIOLOGY REPORT*  Clinical Data: Bilateral lower extremity skin ulcerations.  RIGHT TIBIA AND FIBULA - 2 VIEW  Comparison: None.  Findings: Irregularity of cutaneous tissues is consistent with multiple skin ulcerations.  No radiopaque foreign body or soft tissue gas collection is identified.  No bony destructive change to suggest osteomyelitis is seen.  There is no fracture.  IMPRESSION: Multiple skin ulceration without underlying evidence of osteomyelitis.   Original Report Authenticated By: Orlean Patten, M.D.    US Renal  08/07/2012  *RADIOLOGY REPORT*  Clinical Data: 79 year old male with renal failure.  RENAL/URINARY TRACT ULTRASOUND COMPLETE  Comparison:  None.  Findings:  Right Kidney:  No hydronephrosis.  Renal length 10.6 cm. Cortical echotexture within normal limits.  8 mm upper pole cortical simple appearing cyst.  Left Kidney:  No hydronephrosis.  Renal length 11.6 cm.  Cortical echotexture within normal limits.  Bladder:  Unremarkable.  IMPRESSION: Negative.   Original Report Authenticated By: Roselyn Reef, M.D.    Mr Foot Left W Wo Contrast  08/12/2012  *RADIOLOGY REPORT*  Clinical  Data: Multiple soft tissue foot ulcerations.  Cellulitis.  MRI OF THE LEFT FOREFOOT WITHOUT AND WITH CONTRAST  Technique:  Multiplanar, multisequence MR imaging was performed both before and after administration of intravenous contrast.  Contrast: 58mL MULTIHANCE GADOBENATE DIMEGLUMINE 529 MG/ML IV SOLN  Comparison: Radiographs dated 08/05/2012  Findings: There is no evidence of osteomyelitis.  There is a soft tissue ulceration on the dorsum of the foot overlying the head of the fifth metatarsal but there is no abnormal edema or enhancement of the head of the fifth metatarsal.  There is no joint effusion or bone destruction.  The other bones demonstrate no significant abnormalities.  No significant abnormality of the tendons around the ankle.  No significant joint effusions.  IMPRESSION: Soft tissue ulcerations without evidence of abscesses or osteomyelitis or septic joints.   Original Report Authenticated By: Lorriane Shire, M.D.    Dg Foot 2 Views Left  08/05/2012  *RADIOLOGY REPORT*  Clinical Data: Foot pain with soft tissue ulceration  LEFT FOOT - 2 VIEW  Comparison: None.  Findings: Bones are osteopenic, which may mask subtle fracture. Allowing for two-view technique, there is no displaced fracture or dislocation.  No radiopaque foreign body.  IMPRESSION: Bones are osteopenic, which may mask subtle fracture.  No displaced fracture or dislocation; if there is persistent concern for radiographically occult trauma or osteomyelitis, MRI with contrast would be recommended.   Original Report Authenticated By: Conchita Paris, M.D.    Dg Foot 2 Views Right  08/05/2012  *RADIOLOGY REPORT*  Clinical Data: Foot pain and soft tissue ulceration  RIGHT FOOT - 2 VIEW  Comparison: None.  Findings: Degenerative changes are noted at the first metatarsal phalangeal joint and first interphalangeal joint. Bones are osteopenic, which may mask subtle fracture.  Examination is suboptimal due to two-view technique and positioning.   There is cortical irregularity at the base of the proximal phalanx of the great toe, best seen on the lateral projection.  No radiopaque foreign body.  No gross evidence for fracture or dislocation.  IMPRESSION: Suboptimal exam as above and with osteopenia that may decrease sensitivity for detection of fracture.  Cortical irregularity at the base of the proximal phalanx of the great toe.  This could be remote, however chronic osteomyelitis could have a similar appearance.  Consider MRI with contrast for further evaluation.   Original Report Authenticated By: Conchita Paris, M.D.  Microbiology: Recent Results (from the past 240 hour(s))  CULTURE, BLOOD (ROUTINE X 2)     Status: None   Collection Time    08/05/12  3:45 PM      Result Value Range Status   Specimen Description BLOOD LEFT ANTECUBITAL   Final   Special Requests BOTTLES DRAWN AEROBIC AND ANAEROBIC 10CC   Final   Culture  Setup Time 08/05/2012 23:53   Final   Culture NO GROWTH 5 DAYS   Final   Report Status 08/11/2012 FINAL   Final  CULTURE, BLOOD (ROUTINE X 2)     Status: None   Collection Time    08/05/12  3:55 PM      Result Value Range Status   Specimen Description BLOOD HAND LEFT   Final   Special Requests BOTTLES DRAWN AEROBIC ONLY 10CC   Final   Culture  Setup Time 08/05/2012 23:52   Final   Culture NO GROWTH 5 DAYS   Final   Report Status 08/11/2012 FINAL   Final  WOUND CULTURE     Status: None   Collection Time    08/05/12  4:18 PM      Result Value Range Status   Specimen Description WOUND FOOT LEFT   Final   Special Requests NONE   Final   Gram Stain     Final   Value: ABUNDANT WBC PRESENT, PREDOMINANTLY PMN     NO SQUAMOUS EPITHELIAL CELLS SEEN     ABUNDANT GRAM NEGATIVE RODS     ABUNDANT GRAM POSITIVE COCCI     IN PAIRS   Culture     Final   Value: ABUNDANT PSEUDOMONAS AERUGINOSA     MODERATE METHICILLIN RESISTANT STAPHYLOCOCCUS AUREUS     Note: RIFAMPIN AND GENTAMICIN SHOULD NOT BE USED AS SINGLE DRUGS  FOR TREATMENT OF STAPH INFECTIONS. This organism DOES NOT demonstrate inducible Clindamycin resistance in vitro. CRITICAL RESULT CALLED TO, READ BACK BY AND VERIFIED WITH: SILVIA HOWELL      08/10/12 AT 845 AM BY [facility]   Report Status 08/10/2012 FINAL   Final   Organism ID, Bacteria PSEUDOMONAS AERUGINOSA   Final   Organism ID, Bacteria METHICILLIN RESISTANT STAPHYLOCOCCUS AUREUS   Final     Labs: Basic Metabolic Panel:  Recent Labs Lab 08/07/12 0725 08/08/12 0554 08/09/12 0613 08/12/12 0442  NA 135 135 136 133*  K 3.9 3.6 3.6 3.8  CL 101 100 100 98  CO2 25 24 27 27   GLUCOSE 107* 116* 121* 107*  BUN 13 13 12 10   CREATININE 1.61* 1.55* 1.57* 1.40*  CALCIUM 8.7 8.6 8.7 8.9   Liver Function Tests: No results found for this basename: AST, ALT, ALKPHOS, BILITOT, PROT, ALBUMIN,  in the last 168 hours No results found for this basename: LIPASE, AMYLASE,  in the last 168 hours No results found for this basename: AMMONIA,  in the last 168 hours CBC:  Recent Labs Lab 08/09/12 0613 08/12/12 0442  WBC 6.0 6.1  HGB 9.9* 10.5*  HCT 28.7* 30.5*  MCV 90.3 90.0  PLT 271 355   Cardiac Enzymes: No results found for this basename: CKTOTAL, CKMB, CKMBINDEX, TROPONINI,  in the last 168 hours BNP: BNP (last 3 results)  Recent Labs  08/05/12 1627  PROBNP 654.3*   CBG:  Recent Labs Lab 08/12/12 0653 08/12/12 1100 08/12/12 1630 08/12/12 2201 08/13/12 0657  GLUCAP 104* 132* 124* 100* 97       Signed:  Samule Life, [place]  [facility] 08/13/2012, 9:51  AM     

## 2012-08-22 ENCOUNTER — Non-Acute Institutional Stay (SKILLED_NURSING_FACILITY): Payer: Medicare Other | Admitting: Internal Medicine

## 2012-08-22 DIAGNOSIS — I8310 Varicose veins of unspecified lower extremity with inflammation: Secondary | ICD-10-CM

## 2012-08-22 DIAGNOSIS — L97509 Non-pressure chronic ulcer of other part of unspecified foot with unspecified severity: Secondary | ICD-10-CM

## 2012-08-22 DIAGNOSIS — E1129 Type 2 diabetes mellitus with other diabetic kidney complication: Secondary | ICD-10-CM

## 2012-08-22 DIAGNOSIS — E1165 Type 2 diabetes mellitus with hyperglycemia: Secondary | ICD-10-CM

## 2012-08-22 DIAGNOSIS — E1149 Type 2 diabetes mellitus with other diabetic neurological complication: Secondary | ICD-10-CM

## 2012-08-22 DIAGNOSIS — I872 Venous insufficiency (chronic) (peripheral): Secondary | ICD-10-CM

## 2012-08-22 NOTE — Progress Notes (Signed)
Patient ID: [name] [name], male   DOB: 1927-04-06, 79 y.o.   MRN: [id number] Chief complaint; admission for SNF post [facility] [place] April 2014 through 08/13/2012.  History; this is an 79 year old man who apparently lives on his own in his own apartment. Not completely sure of his functional status, he tells me he walked with a cane for short distances, but was still able to drive his car. He noted increasing edema of his bilateral legs since at least Christmas ultimately getting to the point where he had to cut open his shoe to get his feet in. He presented to hospital with worsening swelling and erythema and pain bilaterally in the lower extremities. He was diagnosed with bilateral lower extremity lymphadenitis and cellulitis. A culture of the wound of the left foot grew both MRSA and Pseudomonas and he is discharged on doxycycline for 2 more weeks with a stop date 08/27/2012. He was treated for a period of time in the hospital with vancomycin. MRI of the left foot show the ulceration on the dorsal of the foot of the left fifth metatarsal head, however, there is no evidence of osteomyelitis. Arterial Doppler suggested absent wave forms in the foot although comment was the skin thickening could dampen this.   Past medical history/plan #1 possible new onset diabetes with a hemoglobin A1c of 6.9 in the hospital he was discharged on here on glyburide. He was not a previously known diabetic. #2 diastolic heart failure . Grade 2. BNP 654 #3 renal insufficiency, acute on chronic. GFR is in the low 40s. Renal ultrasound was within normal limits. Discharge creatinine 1.57 #4 hypertension discharged on verapamil and bidil #5 probable chronic venous stasis and lymphedema with end-stage stasis dermatitis with severe chronic thickening of the skin on the bilateral leg. #6 tobacco abuse. #7 gastroesophageal reflux. #8 gout  Medication; allopurinol 300 a day, doxycycline, 100 mg every 12, Lasix, 20 mg a day, quit [place]  2.5, Indocin, 25, 3 times daily when necessary gout, BiDil 1 tablet twice daily, Ultram when necessary, Verapamil, 240 each bedtime  History   Social History  . Marital Status: Widowed    Spouse Name: N/A    Number of Children: N/A  . Years of Education: N/A   Occupational History  . Not on file.   Social History Main Topics  . Smoking status: Current Some Day Smoker -- 40 years    Types: Pipe  . Smokeless tobacco: Never Used  . Alcohol Use: No  . Drug Use: No  . Sexually Active: No   Other Topics Concern  . Not on file   Social History Narrative  . No narrative on file   No family history on file.  Review of systems;  Respiratory no shortness of breath. Cardiac no chest pain, no exertional chest pain. GI no abnormal pain. No change in bowel habits. Extremities no clear claudication, history.  Physical exam;  HEENT; edentulous no oral lesions. Respiratory shallow, but otherwise clear air entry. Cardiac heart sounds are normal, JVD not elevated. Abdomen obese. No liver no spleen no masses. GU bladder is not distended. No costovertebral tenderness Extremities; severe bilateral thickening of the skin of both lower extremities, likely secondary to chronic venous stasis and chronic lymphedema. The swelling is now controlled. Likely has significant PAd. This is based on discoloration of the skin in his feet. Although I feel popliteal pulses bilaterally I cannot feel his posterior tibial or dorsalis pedis. Skin there is an "over the dorsal last metatarsal head.  The base of the does not look to0 unhealthy, where there is considerable drainage. No overt surrounding infection. Neurologic; he has antigravity strength in his legs. Reflexes are absent at the knee jerk. Decreased sensation bilaterally. No pronator drift. I did not attempt to ambulate him.  Mental status; he is cognitively intact, however, it would be useful Folstein on him, his history is vague.  Impression/plan #1  ulcer on his foot on the left dorsal metatarsal head. Culture results and Pseudomonas. Presumably Pseudomonas was not felt to be the primary culprit as he is gone out on doxycycline. #2 SEVERE. Her extremity chronic venous stasis and lymphedema. He tells me that he has not had wounds on his lower legs in the past. This is indeed fortunate that he is at extreme risk for large areas of circumferential skin loss in his lower extremities.  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  #4 chronic renal failure secondary to hypertension and newly diagnosed diabetes. #5 newly diagnosed diabetes. #6 diastolic heart failure. No evidence of this currently. #7 gait ataxia; did not attempt to ambulate him however this was evident in the physical therapy notes from the hospital. They were using a walker. #8 I suspect there is some social issues here. I wonder if this man can go back to independent living.  #9 probable tinea pedis.  This patient total knee constant attention to his lower extremities in the facility and after he leaves. I wonder, if he'll be able to do this on his own. I have changed his major wound dressing dissolver alginate. [place], topical attention to these legs

## 2012-08-24 ENCOUNTER — Non-Acute Institutional Stay (SKILLED_NURSING_FACILITY): Payer: Medicare Other | Admitting: Internal Medicine

## 2012-08-24 DIAGNOSIS — E1149 Type 2 diabetes mellitus with other diabetic neurological complication: Secondary | ICD-10-CM

## 2012-08-24 DIAGNOSIS — L97509 Non-pressure chronic ulcer of other part of unspecified foot with unspecified severity: Secondary | ICD-10-CM
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
## 2012-08-27 ENCOUNTER — Other Ambulatory Visit: Payer: Self-pay | Admitting: *Deleted

## 2012-10-15 ENCOUNTER — Inpatient Hospital Stay (HOSPITAL_COMMUNITY)
Admission: EM | Admit: 2012-10-15 | Discharge: 2012-10-18 | DRG: 638 | Disposition: A | Discharge status: Home / Self Care | Payer: Medicare Other | Attending: Internal Medicine | Admitting: Internal Medicine

## 2012-10-15 ENCOUNTER — Encounter (HOSPITAL_COMMUNITY): Payer: Self-pay

## 2012-10-15 ENCOUNTER — Emergency Department (HOSPITAL_COMMUNITY): Payer: Medicare Other

## 2012-10-15 DIAGNOSIS — I10 Essential (primary) hypertension: Secondary | ICD-10-CM

## 2012-10-15 DIAGNOSIS — M109 Gout, unspecified: Secondary | ICD-10-CM | POA: Diagnosis present

## 2012-10-15 DIAGNOSIS — I129 Hypertensive chronic kidney disease with stage 1 through stage 4 chronic kidney disease, or unspecified chronic kidney disease: Secondary | ICD-10-CM | POA: Diagnosis present

## 2012-10-15 DIAGNOSIS — F17200 Nicotine dependence, unspecified, uncomplicated: Secondary | ICD-10-CM | POA: Diagnosis present

## 2012-10-15 DIAGNOSIS — R739 Hyperglycemia, unspecified: Secondary | ICD-10-CM

## 2012-10-15 DIAGNOSIS — L03116 Cellulitis of left lower limb: Secondary | ICD-10-CM

## 2012-10-15 DIAGNOSIS — L02619 Cutaneous abscess of unspecified foot: Secondary | ICD-10-CM | POA: Diagnosis present

## 2012-10-15 DIAGNOSIS — Z79899 Other long term (current) drug therapy: Secondary | ICD-10-CM

## 2012-10-15 DIAGNOSIS — N189 Chronic kidney disease, unspecified: Secondary | ICD-10-CM | POA: Diagnosis present

## 2012-10-15 DIAGNOSIS — E1169 Type 2 diabetes mellitus with other specified complication: Principal | ICD-10-CM | POA: Diagnosis present

## 2012-10-15 DIAGNOSIS — L0390 Cellulitis, unspecified: Secondary | ICD-10-CM

## 2012-10-15 DIAGNOSIS — E119 Type 2 diabetes mellitus without complications: Secondary | ICD-10-CM

## 2012-10-15 DIAGNOSIS — E11621 Type 2 diabetes mellitus with foot ulcer: Secondary | ICD-10-CM | POA: Diagnosis present

## 2012-10-15 DIAGNOSIS — L97509 Non-pressure chronic ulcer of other part of unspecified foot with unspecified severity: Secondary | ICD-10-CM | POA: Diagnosis present

## 2012-10-15 DIAGNOSIS — N289 Disorder of kidney and ureter, unspecified: Secondary | ICD-10-CM

## 2012-10-15 DIAGNOSIS — Z720 Tobacco use: Secondary | ICD-10-CM

## 2012-10-15 DIAGNOSIS — I889 Nonspecific lymphadenitis, unspecified: Secondary | ICD-10-CM

## 2012-10-15 HISTORY — DX: Type 2 diabetes mellitus without complications: E11.9

## 2012-10-15 LAB — COMPREHENSIVE METABOLIC PANEL
ALT: 7 U/L (ref 0–53)
AST: 18 U/L (ref 0–37)
Calcium: 9.6 mg/dL (ref 8.4–10.5)
Potassium: 3.7 mEq/L (ref 3.5–5.1)
Sodium: 136 mEq/L (ref 135–145)
Total Protein: 7.4 g/dL (ref 6.0–8.3)

## 2012-10-15 LAB — URINALYSIS, ROUTINE W REFLEX MICROSCOPIC
Bilirubin Urine: NEGATIVE
Glucose, UA: NEGATIVE mg/dL
Hgb urine dipstick: NEGATIVE
Specific Gravity, Urine: 1.021 (ref 1.005–1.030)
pH: 5 (ref 5.0–8.0)

## 2012-10-15 LAB — URINE MICROSCOPIC-ADD ON

## 2012-10-15 LAB — CBC WITH DIFFERENTIAL/PLATELET
Basophils Absolute: 0 10*3/uL (ref 0.0–0.1)
Eosinophils Absolute: 0.1 10*3/uL (ref 0.0–0.7)
Eosinophils Relative: 2 % (ref 0–5)
Lymphocytes Relative: 21 % (ref 12–46)
MCH: 30.1 pg (ref 26.0–34.0)
MCV: 88 fL (ref 78.0–100.0)
Neutrophils Relative %: 66 % (ref 43–77)
Platelets: 285 10*3/uL (ref 150–400)
RDW: 14.7 % (ref 11.5–15.5)
WBC: 6 10*3/uL (ref 4.0–10.5)

## 2012-10-15 LAB — CG4 I-STAT (LACTIC ACID): Lactic Acid, Venous: 1.87 mmol/L (ref 0.5–2.2)

## 2012-10-15 MED ORDER — VANCOMYCIN HCL IN DEXTROSE 1-5 GM/200ML-% IV SOLN
1000.0000 mg | Freq: Once | INTRAVENOUS | Status: AC
  Administered 2012-10-15: 1000 mg via INTRAVENOUS
  Filled 2012-10-15: qty 200

## 2012-10-15 MED ORDER — ONDANSETRON HCL 4 MG/2ML IJ SOLN
4.0000 mg | Freq: Once | INTRAMUSCULAR | Status: AC
  Administered 2012-10-15: 4 mg via INTRAVENOUS
  Filled 2012-10-15: qty 2

## 2012-10-15 MED ORDER — SODIUM CHLORIDE 0.9 % IV SOLN
Freq: Once | INTRAVENOUS | Status: AC
  Administered 2012-10-15: 21:00:00 via INTRAVENOUS

## 2012-10-15 MED ORDER — MORPHINE SULFATE 4 MG/ML IJ SOLN
4.0000 mg | Freq: Once | INTRAMUSCULAR | Status: AC
  Administered 2012-10-15: 4 mg via INTRAVENOUS
  Filled 2012-10-15: qty 1

## 2012-10-15 NOTE — ED Notes (Signed)
MD at bedside. Consult Hospitalist

## 2012-10-15 NOTE — ED Notes (Signed)
Patient presents to ED via [place]. Pt c/o right leg "weeping." Patient told EMS that he has a home health care nurse that comes everyday to dress his legs. Upon EMS assessment they found both legs to be weeping at this time. Lungs clear. Pt denies any chest pain. BP 128/68 HR 86 Resp 16. A&Ox4.

## 2012-10-15 NOTE — H&P (Signed)
[facility] History and Physical  [name] [name] [id number] DOB: Mar 14, 1927 DOA: 10/15/2012  Referring physician: ER physician. PCP: No PCP Per Patient   Chief Complaint: Lower extremity pain and swelling.  HPI: [name] [name] is a 79 y.o. male with known history of chronic lower extremity wound has been increased pain in the lower extremity with fluid seeping out of his right lower extremity and his left dorsal foot ulcer worsening with discharge. Patient is a poor historian. Patient in the ER had x-rays which did not show any features consistent with osteomyelitis but on admission was found to have increased drainage from left foot ulcer and right leg. Patient has been admitted for further management. Patient denies any chest pain or shortness of breath.  Review of Systems: As presented in the history of presenting illness, rest negative.  Past Medical History  Diagnosis Date  . Hypertension   . Lymphadenitis 08/10/2012  . Gout   . Chronic kidney disease     RENAL INSUFICCIENCY  . Diabetes mellitus without complication    History reviewed. No pertinent past surgical history. Social History:  reports that he has been smoking Pipe.  He has never used smokeless tobacco. He reports that he does not drink alcohol or use illicit drugs. Boarding house. where does patient live-- Not sure. Can patient participate in ADLs?  No Known Allergies  History reviewed. No pertinent family history.    Prior to Admission medications   Medication Sig Start Date End Date Taking? Authorizing Provider  allopurinol (ZYLOPRIM) 100 MG tablet Take 100 mg by mouth daily.   Yes Historical Provider, MD  clobetasol cream (TEMOVATE) [id number] % Apply 1 application topically 2 (two) times daily.   Yes Historical Provider, MD  glyBURIDE (DIABETA) 2.5 MG tablet Take 1 tablet (2.5 mg total) by mouth daily with breakfast. 08/13/12  Yes Nita Sells, MD  indomethacin (INDOCIN) 25 MG capsule Take 25 mg by mouth 3  (three) times daily as needed (gout).    Yes Historical Provider, MD  losartan (COZAAR) 25 MG tablet Take 25 mg by mouth daily.   Yes Historical Provider, MD  verapamil (CALAN-SR) 240 MG CR tablet Take 240 mg by mouth daily.    Yes Historical Provider, MD   Physical Exam: Filed Vitals:   10/15/12 2215 10/15/12 2230 10/15/12 2245 10/15/12 2300  BP:    115/61  Pulse: 101 109 108 106  Temp:      TempSrc:      Resp:      SpO2: 90% 100% 99% 100%     General:  Well-developed and nourished.  Eyes: Anicteric no pallor.  ENT: No discharge from the ears eyes nose mouth.  Neck: No mass felt.  Cardiovascular: S1-S2 heard.  Respiratory: No rhonchi or crepitations.  Abdomen: Soft nontender bowel sounds present.  Skin: Patient has an ulcer on the dorsal aspect of his left foot which is around 3 cm in diameter with no active discharge at this time on my exam. Also has chronic skin changes in the lower extremity with some fluid seeping from the right lower extremity.  Musculoskeletal: See skin changes.  Psychiatric: Appears normal.  Neurologic: Alert awake oriented to his name and place. Moves all extremities.  Labs on Admission:  Basic Metabolic Panel:  Recent Labs Lab 10/15/12 2026  NA 136  K 3.7  CL 102  CO2 21  GLUCOSE 96  BUN 16  CREATININE 1.09  CALCIUM 9.6   Liver Function Tests:  Recent Labs Lab 10/15/12  2026  AST 18  ALT 7  ALKPHOS 54  BILITOT 0.8  PROT 7.4  ALBUMIN 3.1*   No results found for this basename: LIPASE, AMYLASE,  in the last 168 hours No results found for this basename: AMMONIA,  in the last 168 hours CBC:  Recent Labs Lab 10/15/12 2026  WBC 6.0  NEUTROABS 3.9  HGB 11.8*  HCT 34.5*  MCV 88.0  PLT 285   Cardiac Enzymes: No results found for this basename: CKTOTAL, CKMB, CKMBINDEX, TROPONINI,  in the last 168 hours  BNP (last 3 results)  Recent Labs  08/05/12 1627  PROBNP 654.3*   CBG: No results found for this basename:  GLUCAP,  in the last 168 hours  Radiological Exams on Admission: Dg Chest 2 View  10/15/2012   *RADIOLOGY REPORT*  Clinical Data: Chest pain and swelling of both legs.  CHEST - 2 VIEW  Comparison: 03/15/2006.  Findings: The heart is enlarged but stable.  There is mild tortuosity of the thoracic aorta.  Mild vascular congestion but no overt pulmonary edema.  No pleural effusions or focal infiltrates. The bony thorax is intact.  IMPRESSION: Cardiac enlargement and mild vascular congestion without overt pulmonary edema or pleural effusion.   Original Report Authenticated By: Marijo Sanes, M.D.   Dg Foot Complete Left  10/15/2012   *RADIOLOGY REPORT*  Clinical Data: Pain and swelling, chronic venous insufficiency, open wound anterior left foot  LEFT FOOT - COMPLETE 3+ VIEW  Comparison: 08/05/2012  Findings: Marked osseous demineralization. Scattered narrowing of interphalangeal joints as well as first MTP joint. More pronounced lucency at the heads of the fourth and fifth metatarsals likely related to overlying soft tissue wound. No acute fracture, dislocation or bone destruction. Specifically no definite evidence of osteomyelitis. Mild tibiotalar degenerative changes. Significant soft tissue swelling left foot.  IMPRESSION: Marked osseous demineralization. No definite evidence of osteomyelitis.   Original Report Authenticated By: Lavonia Dana, M.D.   Dg Foot Complete Right  10/15/2012   *RADIOLOGY REPORT*  Clinical Data: Pain and swelling right foot, chronic venous insufficiency, hypertension, diabetes  RIGHT FOOT COMPLETE - 3+ VIEW  Comparison: 08/05/2012  Findings: Marked soft tissue swelling particularly at dorsum of foot. Diffuse osseous demineralization. Scattered narrowing at interphalangeal joints and first MTP joint. No acute fracture or dislocation. No definite bone destruction seen to suggest osteomyelitis. Tibiotalar and subtalar joint degenerative changes. Achilles insertion calcaneal spurring.   IMPRESSION: Osseous demineralization with scattered degenerative changes. No definite acute bony abnormalities.   Original Report Authenticated By: Lavonia Dana, M.D.     Assessment/Plan Principal Problem:   Diabetic ulcer of left foot Active Problems:   HTN (hypertension)   Tobacco abuse   Diabetes mellitus   1. Left foot diabetic ulcer with possible cellulitis of the right leg - I have ordered MRI of the left foot. Patient has been placed on vancomycin. Wound team consult requested. Check Doppler to rule out DVT. 2. Diabetes mellitus type 2 - continue home medications with close followup of CBG. 3. Hypertension - continue home medications. 4. Anemia - follow CBC. 5. History of gout - continue present medication.    Code Status: Full code.  Family Communication: None.  Disposition Plan: Admit to inpatient.    Amrom Ore N. [facility] Pager [phone].  If 7PM-7AM, please contact night-coverage [email or web address] Password [facility] 10/15/2012, 11:54 PM

## 2012-10-15 NOTE — ED Provider Notes (Signed)
History     CSN: [id number]  Arrival date & time 10/15/12  1729   None     Chief Complaint  Patient presents with  . Leg Pain    (Consider location/radiation/quality/duration/timing/severity/associated sxs/prior treatment) HPI Comments: Patient is an 79 year old man living at a boarding house. He has chronic bilateral leg swelling and redness. He says that in the past his legs were then wrapped, but recently they the wrapping is been taken off. He has an ulcerated area on his left foot. Both feet are quite swollen and red. He was therefore sent to [facility]  for evaluation and treatment.  Patient is a 79 y.o. male presenting with leg pain. The history is provided by the patient and medical records.  Leg Pain Location:  Leg Time since incident: He has chronic swelling of both legs, thought to be due to chronic venous insufficiency. Leg location:  L lower leg and R lower leg Pain details:    Quality:  Aching   Radiates to:  Does not radiate   Severity:  Severe   Onset quality:  Gradual   Duration: His pain has gotten worse over the past few days.   Timing:  Constant   Progression:  Worsening Chronicity:  Chronic Dislocation: no   Foreign body present:  No foreign bodies Prior injury to area:  No Relieved by:  Nothing Worsened by:  Nothing tried Ineffective treatments: Prior treatment with compression bandages.   Past Medical History  Diagnosis Date  . Hypertension   . Lymphadenitis 08/10/2012  . Gout   . Chronic kidney disease     RENAL INSUFICCIENCY  . Diabetes mellitus without complication     History reviewed. No pertinent past surgical history.  No family history on file.  History  Substance Use Topics  . Smoking status: Current Some Day Smoker -- 40 years    Types: Pipe  . Smokeless tobacco: Never Used  . Alcohol Use: No      Review of Systems  Constitutional: Negative.   HENT: Negative.   Eyes: Negative.   Respiratory: Negative.    Cardiovascular: Negative.   Gastrointestinal: Negative.   Genitourinary: Negative.   Musculoskeletal: Negative.   Skin:       Swelling and chronic edema of both legs, with redness of both feet, and 2 cm diameter ulcer on the sole of the left foot.   Neurological: Negative.   Psychiatric/Behavioral: Negative.     Allergies  Review of patient's allergies indicates no known allergies.  Home Medications   Current Outpatient Rx  Name  Route  Sig  Dispense  Refill  . allopurinol (ZYLOPRIM) 100 MG tablet   Oral   Take 100 mg by mouth daily.         . clobetasol cream (TEMOVATE) 0.05 %   Topical   Apply 1 application topically 2 (two) times daily.         Marland Kitchen glyBURIDE (DIABETA) 2.5 MG tablet   Oral   Take 1 tablet (2.5 mg total) by mouth daily with breakfast.         . indomethacin (INDOCIN) 25 MG capsule   Oral   Take 25 mg by mouth 3 (three) times daily as needed (gout).          Marland Kitchen losartan (COZAAR) 25 MG tablet   Oral   Take 25 mg by mouth daily.         . verapamil (CALAN-SR) 240 MG CR tablet   Oral  Take 240 mg by mouth daily.            BP 136/77  Pulse 83  Temp(Src) 98.3 F (36.8 C) (Oral)  Resp 17  SpO2 95%  Physical Exam  Constitutional: He is oriented to person, place, and time. He appears well-developed and well-nourished.  HENT:  Head: Normocephalic and atraumatic.  Right Ear: External ear normal.  Left Ear: External ear normal.  Mouth/Throat: Oropharynx is clear and moist.  Eyes: Conjunctivae and EOM are normal. Pupils are equal, round, and reactive to light.  Neck: Normal range of motion. Neck supple.  Cardiovascular: Normal rate, regular rhythm and normal heart sounds.   Pulmonary/Chest: Effort normal and breath sounds normal.  Abdominal: Soft. Bowel sounds are normal.  Musculoskeletal:  He has chronic swelling, hyperpigmentation and stasis changes in both legs from the knees down.  His feet are swollen and tender. He has a 2 cm  ulcer on the dorsum of the left foot overlying the left 4th and 5th metatarsal heads.  A culture was obtained from the base of this ulcer.  Neurological: He is alert and oriented to person, place, and time.  No sensory or motor deficit.  Skin: There is erythema.  Psychiatric: He has a normal mood and affect. His behavior is normal.    ED Course  Procedures (including critical care time)  7:11 PM  Date: 10/15/2012  Rate:92  Rhythm: normal sinus rhythm  QRS Axis: normal  Intervals: QT prolonged  ST/T Wave abnormalities: ST depressions laterally  Conduction Disutrbances:none  Narrative Interpretation: Borderline EKg  Old EKG Reviewed: none available   8:16 PM Pt was seen and physical exam was performed. A culture was obtained from the ulcer on his left foot.   Lab workup ordered.    10:31 PM Results for orders placed during the hospital encounter of 10/15/12  CBC WITH DIFFERENTIAL      Result Value Range   WBC 6.0  4.0 - 10.5 K/uL   RBC 3.92 (*) 4.22 - 5.81 MIL/uL   Hemoglobin 11.8 (*) 13.0 - 17.0 g/dL   HCT 34.5 (*) 39.0 - 52.0 %   MCV 88.0  78.0 - 100.0 fL   MCH 30.1  26.0 - 34.0 pg   MCHC 34.2  30.0 - 36.0 g/dL   RDW 14.7  11.5 - 15.5 %   Platelets 285  150 - 400 K/uL   Neutrophils Relative % 66  43 - 77 %   Neutro Abs 3.9  1.7 - 7.7 K/uL   Lymphocytes Relative 21  12 - 46 %   Lymphs Abs 1.2  0.7 - 4.0 K/uL   Monocytes Relative 11  3 - 12 %   Monocytes Absolute 0.7  0.1 - 1.0 K/uL   Eosinophils Relative 2  0 - 5 %   Eosinophils Absolute 0.1  0.0 - 0.7 K/uL   Basophils Relative 0  0 - 1 %   Basophils Absolute 0.0  0.0 - 0.1 K/uL  COMPREHENSIVE METABOLIC PANEL      Result Value Range   Sodium 136  135 - 145 mEq/L   Potassium 3.7  3.5 - 5.1 mEq/L   Chloride 102  96 - 112 mEq/L   CO2 21  19 - 32 mEq/L   Glucose, Bld 96  70 - 99 mg/dL   BUN 16  6 - 23 mg/dL   Creatinine, Ser 1.09  0.50 - 1.35 mg/dL   Calcium 9.6  8.4 - 10.5 mg/dL  Total Protein 7.4  6.0 - 8.3  g/dL   Albumin 3.1 (*) 3.5 - 5.2 g/dL   AST 18  0 - 37 U/L   ALT 7  0 - 53 U/L   Alkaline Phosphatase 54  39 - 117 U/L   Total Bilirubin 0.8  0.3 - 1.2 mg/dL   GFR calc non Af Amer 60 (*) >90 mL/min   GFR calc Af Amer 70 (*) >90 mL/min  URINALYSIS, ROUTINE W REFLEX MICROSCOPIC      Result Value Range   Color, Urine YELLOW  YELLOW   APPearance CLEAR  CLEAR   Specific Gravity, Urine 1.021  1.005 - 1.030   pH 5.0  5.0 - 8.0   Glucose, UA NEGATIVE  NEGATIVE mg/dL   Hgb urine dipstick NEGATIVE  NEGATIVE   Bilirubin Urine NEGATIVE  NEGATIVE   Ketones, ur NEGATIVE  NEGATIVE mg/dL   Protein, ur NEGATIVE  NEGATIVE mg/dL   Urobilinogen, UA 0.2  0.0 - 1.0 mg/dL   Nitrite NEGATIVE  NEGATIVE   Leukocytes, UA TRACE (*) NEGATIVE  URINE MICROSCOPIC-ADD ON      Result Value Range   Squamous Epithelial / LPF RARE  RARE   WBC, UA 3-6  <3 WBC/hpf   RBC / HPF 0-2  <3 RBC/hpf   Bacteria, UA RARE  RARE   Urine-Other MUCOUS PRESENT    CG4 I-STAT (LACTIC ACID)      Result Value Range   Lactic Acid, Venous 1.87  0.5 - 2.2 mmol/L   Dg Chest 2 View  10/15/2012   *RADIOLOGY REPORT*  Clinical Data: Chest pain and swelling of both legs.  CHEST - 2 VIEW  Comparison: 03/15/2006.  Findings: The heart is enlarged but stable.  There is mild tortuosity of the thoracic aorta.  Mild vascular congestion but no overt pulmonary edema.  No pleural effusions or focal infiltrates. The bony thorax is intact.  IMPRESSION: Cardiac enlargement and mild vascular congestion without overt pulmonary edema or pleural effusion.   Original Report Authenticated By: Marijo Sanes, M.D.   Dg Foot Complete Left  10/15/2012   *RADIOLOGY REPORT*  Clinical Data: Pain and swelling, chronic venous insufficiency, open wound anterior left foot  LEFT FOOT - COMPLETE 3+ VIEW  Comparison: 08/05/2012  Findings: Marked osseous demineralization. Scattered narrowing of interphalangeal joints as well as first MTP joint. More pronounced lucency at the  heads of the fourth and fifth metatarsals likely related to overlying soft tissue wound. No acute fracture, dislocation or bone destruction. Specifically no definite evidence of osteomyelitis. Mild tibiotalar degenerative changes. Significant soft tissue swelling left foot.  IMPRESSION: Marked osseous demineralization. No definite evidence of osteomyelitis.   Original Report Authenticated By: Lavonia Dana, M.D.   Dg Foot Complete Right  10/15/2012   *RADIOLOGY REPORT*  Clinical Data: Pain and swelling right foot, chronic venous insufficiency, hypertension, diabetes  RIGHT FOOT COMPLETE - 3+ VIEW  Comparison: 08/05/2012  Findings: Marked soft tissue swelling particularly at dorsum of foot. Diffuse osseous demineralization. Scattered narrowing at interphalangeal joints and first MTP joint. No acute fracture or dislocation. No definite bone destruction seen to suggest osteomyelitis. Tibiotalar and subtalar joint degenerative changes. Achilles insertion calcaneal spurring.  IMPRESSION: Osseous demineralization with scattered degenerative changes. No definite acute bony abnormalities.   Original Report Authenticated By: Lavonia Dana, M.D.    X-rays showed no evidence of osteomyelitis.  Will need admission for IV antibiotics for cellulitis of his feet.  1. Cellulitis of foot, left  Mylinda Latina III, MD 10/15/12 2259

## 2012-10-16 ENCOUNTER — Encounter (HOSPITAL_COMMUNITY): Payer: Self-pay | Admitting: *Deleted

## 2012-10-16 LAB — HEMOGLOBIN A1C
Hgb A1c MFr Bld: 5.8 % — ABNORMAL HIGH (ref <5.7)
Mean Plasma Glucose: 120 mg/dL — ABNORMAL HIGH (ref <117)

## 2012-10-16 LAB — GLUCOSE, CAPILLARY
Glucose-Capillary: 104 mg/dL — ABNORMAL HIGH (ref 70–99)
Glucose-Capillary: 121 mg/dL — ABNORMAL HIGH (ref 70–99)

## 2012-10-16 LAB — BASIC METABOLIC PANEL
Calcium: 8.3 mg/dL — ABNORMAL LOW (ref 8.4–10.5)
Creatinine, Ser: 1.24 mg/dL (ref 0.50–1.35)
GFR calc Af Amer: 60 mL/min — ABNORMAL LOW (ref >90)

## 2012-10-16 LAB — CBC
Platelets: 261 10*3/uL (ref 150–400)
RDW: 14.8 % (ref 11.5–15.5)
WBC: 6.3 10*3/uL (ref 4.0–10.5)

## 2012-10-16 MED ORDER — VANCOMYCIN HCL IN DEXTROSE 1-5 GM/200ML-% IV SOLN
1000.0000 mg | Freq: Two times a day (BID) | INTRAVENOUS | Status: DC
  Administered 2012-10-16 – 2012-10-18 (×5): 1000 mg via INTRAVENOUS
  Filled 2012-10-16 (×8): qty 200

## 2012-10-16 MED ORDER — ALLOPURINOL 100 MG PO TABS
100.0000 mg | ORAL_TABLET | Freq: Every day | ORAL | Status: DC
  Administered 2012-10-16 – 2012-10-18 (×3): 100 mg via ORAL
  Filled 2012-10-16 (×3): qty 1

## 2012-10-16 MED ORDER — VERAPAMIL HCL ER 240 MG PO TBCR
240.0000 mg | EXTENDED_RELEASE_TABLET | Freq: Every day | ORAL | Status: DC
  Administered 2012-10-16 – 2012-10-18 (×3): 240 mg via ORAL
  Filled 2012-10-16 (×3): qty 1

## 2012-10-16 MED ORDER — PIPERACILLIN-TAZOBACTAM 3.375 G IVPB
3.3750 g | Freq: Three times a day (TID) | INTRAVENOUS | Status: DC
  Administered 2012-10-16 – 2012-10-18 (×7): 3.375 g via INTRAVENOUS
  Filled 2012-10-16 (×10): qty 50

## 2012-10-16 MED ORDER — GLYBURIDE 2.5 MG PO TABS: 2.5000 mg | ORAL_TABLET | Freq: Every day | ORAL | Status: DC

## 2012-10-16 MED ORDER — ENOXAPARIN SODIUM 40 MG/0.4ML ~~LOC~~ SOLN
40.0000 mg | SUBCUTANEOUS | Status: DC
  Administered 2012-10-16 – 2012-10-18 (×3): 40 mg via SUBCUTANEOUS
  Filled 2012-10-16 (×3): qty 0.4

## 2012-10-16 MED ORDER — ONDANSETRON HCL 4 MG PO TABS: 4.0000 mg | ORAL_TABLET | Freq: Four times a day (QID) | ORAL | Status: DC | PRN

## 2012-10-16 MED ORDER — ACETAMINOPHEN 650 MG RE SUPP: 650.0000 mg | Freq: Four times a day (QID) | RECTAL | Status: DC | PRN

## 2012-10-16 MED ORDER — VANCOMYCIN HCL IN DEXTROSE 1-5 GM/200ML-% IV SOLN: 1000.0000 mg | Freq: Two times a day (BID) | INTRAVENOUS | Status: DC

## 2012-10-16 MED ORDER — GLYBURIDE 2.5 MG PO TABS
2.5000 mg | ORAL_TABLET | Freq: Every day | ORAL | Status: DC
  Administered 2012-10-16 – 2012-10-18 (×3): 2.5 mg via ORAL
  Filled 2012-10-16 (×4): qty 1

## 2012-10-16 MED ORDER — ACETAMINOPHEN 325 MG PO TABS
650.0000 mg | ORAL_TABLET | Freq: Four times a day (QID) | ORAL | Status: DC | PRN
  Administered 2012-10-16 (×2): 650 mg via ORAL
  Filled 2012-10-16 (×2): qty 2

## 2012-10-16 MED ORDER — SODIUM CHLORIDE 0.9 % IJ SOLN: 3.0000 mL | Freq: Two times a day (BID) | INTRAMUSCULAR | Status: DC

## 2012-10-16 MED ORDER — OXYCODONE HCL 5 MG PO TABS
5.0000 mg | ORAL_TABLET | Freq: Four times a day (QID) | ORAL | Status: DC | PRN
  Administered 2012-10-17 – 2012-10-18 (×3): 5 mg via ORAL
  Filled 2012-10-16 (×3): qty 1

## 2012-10-16 MED ORDER — LOSARTAN POTASSIUM 25 MG PO TABS
25.0000 mg | ORAL_TABLET | Freq: Every day | ORAL | Status: DC
  Administered 2012-10-16 – 2012-10-18 (×3): 25 mg via ORAL
  Filled 2012-10-16 (×3): qty 1

## 2012-10-16 MED ORDER — ALLOPURINOL 300 MG PO TABS: 300.0000 mg | ORAL_TABLET | Freq: Every day | ORAL | Status: DC

## 2012-10-16 MED ORDER — CLOBETASOL PROPIONATE 0.05 % EX CREA
1.0000 "application " | TOPICAL_CREAM | Freq: Two times a day (BID) | CUTANEOUS | Status: DC
  Administered 2012-10-16 – 2012-10-18 (×5): 1 via TOPICAL
  Filled 2012-10-16 (×2): qty 15

## 2012-10-16 MED ORDER — LOSARTAN POTASSIUM 25 MG PO TABS: 25.0000 mg | ORAL_TABLET | Freq: Every day | ORAL | Status: DC

## 2012-10-16 MED ORDER — ONDANSETRON HCL 4 MG/2ML IJ SOLN: 4.0000 mg | Freq: Four times a day (QID) | INTRAMUSCULAR | Status: DC | PRN

## 2012-10-16 MED ORDER — SODIUM CHLORIDE 0.9 % IV SOLN
INTRAVENOUS | Status: AC
  Administered 2012-10-16: 07:00:00 via INTRAVENOUS

## 2012-10-16 MED ORDER — INSULIN ASPART 100 UNIT/ML ~~LOC~~ SOLN
0.0000 [IU] | Freq: Three times a day (TID) | SUBCUTANEOUS | Status: DC
  Administered 2012-10-16: 1 [IU] via SUBCUTANEOUS

## 2012-10-16 MED ORDER — VERAPAMIL HCL ER 240 MG PO TBCR: 240.0000 mg | EXTENDED_RELEASE_TABLET | Freq: Every day | ORAL | Status: DC

## 2012-10-16 NOTE — Progress Notes (Addendum)
ANTIBIOTIC CONSULT NOTE - INITIAL  Pharmacy Consult for vancomycin Indication: foot ulcer and cellulitis  No Known Allergies  Patient Measurements: Height: 5\' 10"  (177.8 cm) Weight: 234 lb 2.1 oz (106.2 kg) IBW/kg (Calculated) : 73  Vital Signs: Temp: 98.3 F (36.8 C) (06/20 1759) Temp src: Oral (06/20 1759) BP: 158/81 mmHg (06/20 2330) Pulse Rate: 109 (06/20 2330)  Labs:  Recent Labs  10/15/12 2026  WBC 6.0  HGB 11.8*  PLT 285  CREATININE 1.09   Estimated Creatinine Clearance: 61.6 ml/min (by C-G formula based on Cr of 1.09).    Microbiology: No results found for this or any previous visit (from the past 720 hour(s)).  Medical History: Past Medical History  Diagnosis Date  . Hypertension   . Lymphadenitis 08/10/2012  . Gout   . Chronic kidney disease     RENAL INSUFICCIENCY  . Diabetes mellitus without complication     Medications:  Prescriptions prior to admission  Medication Sig Dispense Refill  . allopurinol (ZYLOPRIM) 100 MG tablet Take 100 mg by mouth daily.      . clobetasol cream (TEMOVATE) [id number] % Apply 1 application topically 2 (two) times daily.      Marland Kitchen glyBURIDE (DIABETA) 2.5 MG tablet Take 1 tablet (2.5 mg total) by mouth daily with breakfast.      . indomethacin (INDOCIN) 25 MG capsule Take 25 mg by mouth 3 (three) times daily as needed (gout).       Marland Kitchen losartan (COZAAR) 25 MG tablet Take 25 mg by mouth daily.      . verapamil (CALAN-SR) 240 MG CR tablet Take 240 mg by mouth daily.        Scheduled:  . allopurinol  100 mg Oral Daily  . allopurinol  300 mg Oral Daily  . clobetasol cream  1 application Topical BID  . enoxaparin (LOVENOX) injection  40 mg Subcutaneous Q24H  . glyBURIDE  2.5 mg Oral Q breakfast  . glyBURIDE  2.5 mg Oral Q breakfast  . insulin aspart  0-9 Units Subcutaneous TID WC  . losartan  25 mg Oral Daily  . losartan  25 mg Oral Daily  . sodium chloride  3 mL Intravenous Q12H  . vancomycin  1,000 mg Intravenous Q12H  .  verapamil  240 mg Oral Daily  . verapamil  240 mg Oral Daily   Infusions:  . sodium chloride      Assessment: 79yo male has chronic BLE swelling and redness, now w/ ulcerated area of left foot with increased swelling and erythema of both feet, to begin IV ABX for cellulitis; Xray negative for signs of osteo; on recent admission pt had vanc trough of ~12 on vanc 1500mg  IV Q24H though SCr has improved ~1.6 then --> 1.09 now.  Goal of Therapy:  Vancomycin trough level 10-15 mcg/ml  Plan:  Will begin vancomycin 1000mg  IV Q12H and monitor CBC, Cx, SCr, levels prn.  Wynona Neat, PharmD, BCPS  10/16/2012,12:54 AM  Addendum: Patient now to start Zosyn as well. SCr this morning 1.24 with a CrCl ~63mL/min. No allergies.  Plan: -start Zosyn 3.375gm IV q8h extended interval dosing - follow up cultures and clincal status  Darcey Demma D. Takeshia Wenk, PharmD Clinical Pharmacist Pager: [phone] 10/16/2012 8:40 AM

## 2012-10-16 NOTE — Progress Notes (Signed)
CBG remains low at 61, pt remains asymptomatic.  Supper tray placed in front of patient.

## 2012-10-16 NOTE — Progress Notes (Signed)
CBG up to 134 after eating supper, no coverage to be given d/t previous CBG 61

## 2012-10-16 NOTE — Progress Notes (Signed)
CBG 63, gave pt orange juice and graham crackers at this time due to pt asymptomatic for hypoglycemia.

## 2012-10-16 NOTE — Progress Notes (Signed)
[facility] PROGRESS NOTE  [name] [name] [id number] DOB: Sep 15, 1926 DOA: 10/15/2012 PCP: No PCP Per Patient  Assessment/Plan: Diabetic Foot Ulcer, Left -On the dorsum of foot. -MRI pending to eval for possible osteomyelitis. -Continue vancomycin/Zosyn. -Remains afebrile and without leukocytosis. -Is having some pain-control issues. Will add Oxy-IR. -Wound care consult requested.  DM -Well controlled. -Continue current regimen.  HTN -Well controlled.  Code Status: Full code Family Communication: Patient only   Disposition Plan: To be determined.   Consultants:  None   Antibiotics:  Vanc day 2  Zosyn day 1   Subjective: C/o pain.  Objective: Filed Vitals:   10/15/12 2330 10/16/12 0030 10/16/12 0544 10/16/12 1036  BP: 158/81 135/61 118/54 124/60  Pulse: 109 99 83   Temp:  99.1 F (37.3 C) 99.7 F (37.6 C)   TempSrc:      Resp:  18 16   Height: 5\' 10"  (1.778 m)     Weight: 106.2 kg (234 lb 2.1 oz)     SpO2: 95% 95% 97%     Intake/Output Summary (Last 24 hours) at 10/16/12 1107 Last data filed at 10/16/12 [id number]  Gross per 24 hour  Intake 356.25 ml  Output    735 ml  Net -378.75 ml   Filed Weights   10/15/12 2330  Weight: 106.2 kg (234 lb 2.1 oz)    Exam:   General:  awake  Cardiovascular: RRR, no M/R/G  Respiratory: CTA B  Abdomen: S/NT/ND/+BS  Extremities: Ulcer on dorsum of left foot, hyperkeratosis of bilateral lower extremities.   Neurologic:  Grossly intact and non-focal  Data Reviewed: Basic Metabolic Panel:  Recent Labs Lab 10/15/12 2026 10/16/12 0625  NA 136 137  K 3.7 3.8  CL 102 103  CO2 21 24  GLUCOSE 96 108*  BUN 16 15  CREATININE 1.09 1.24  CALCIUM 9.6 8.3*   Liver Function Tests:  Recent Labs Lab 10/15/12 2026  AST 18  ALT 7  ALKPHOS 54  BILITOT 0.8  PROT 7.4  ALBUMIN 3.1*   No results found for this basename: LIPASE, AMYLASE,  in the last 168 hours No results found for this basename: AMMONIA,   in the last 168 hours CBC:  Recent Labs Lab 10/15/12 2026 10/16/12 0625  WBC 6.0 6.3  NEUTROABS 3.9  --   HGB 11.8* 10.6*  HCT 34.5* 31.4*  MCV 88.0 89.7  PLT 285 261   Cardiac Enzymes: No results found for this basename: CKTOTAL, CKMB, CKMBINDEX, TROPONINI,  in the last 168 hours BNP (last 3 results)  Recent Labs  08/05/12 1627  PROBNP 654.3*   CBG:  Recent Labs Lab 10/16/12 0036 10/16/12 0647  GLUCAP 203* 104*    Recent Results (from the past 240 hour(s))  WOUND CULTURE     Status: None   Collection Time    10/15/12  8:04 PM      Result Value Range Status   Specimen Description WOUND LEFT FOOT   Final   Special Requests NONE   Final   Gram Stain     Final   Value: RARE WBC PRESENT,BOTH PMN AND MONONUCLEAR     NO SQUAMOUS EPITHELIAL CELLS SEEN     NO ORGANISMS SEEN   Culture Culture reincubated for better growth   Final   Report Status PENDING   Incomplete     Studies: Dg Chest 2 View  10/15/2012   *RADIOLOGY REPORT*  Clinical Data: Chest pain and swelling of both legs.  CHEST - 2  VIEW  Comparison: 03/15/2006.  Findings: The heart is enlarged but stable.  There is mild tortuosity of the thoracic aorta.  Mild vascular congestion but no overt pulmonary edema.  No pleural effusions or focal infiltrates. The bony thorax is intact.  IMPRESSION: Cardiac enlargement and mild vascular congestion without overt pulmonary edema or pleural effusion.   Original Report Authenticated By: Marijo Sanes, M.D.   Dg Foot Complete Left  10/15/2012   *RADIOLOGY REPORT*  Clinical Data: Pain and swelling, chronic venous insufficiency, open wound anterior left foot  LEFT FOOT - COMPLETE 3+ VIEW  Comparison: 08/05/2012  Findings: Marked osseous demineralization. Scattered narrowing of interphalangeal joints as well as first MTP joint. More pronounced lucency at the heads of the fourth and fifth metatarsals likely related to overlying soft tissue wound. No acute fracture, dislocation or  bone destruction. Specifically no definite evidence of osteomyelitis. Mild tibiotalar degenerative changes. Significant soft tissue swelling left foot.  IMPRESSION: Marked osseous demineralization. No definite evidence of osteomyelitis.   Original Report Authenticated By: Lavonia Dana, M.D.   Dg Foot Complete Right  10/15/2012   *RADIOLOGY REPORT*  Clinical Data: Pain and swelling right foot, chronic venous insufficiency, hypertension, diabetes  RIGHT FOOT COMPLETE - 3+ VIEW  Comparison: 08/05/2012  Findings: Marked soft tissue swelling particularly at dorsum of foot. Diffuse osseous demineralization. Scattered narrowing at interphalangeal joints and first MTP joint. No acute fracture or dislocation. No definite bone destruction seen to suggest osteomyelitis. Tibiotalar and subtalar joint degenerative changes. Achilles insertion calcaneal spurring.  IMPRESSION: Osseous demineralization with scattered degenerative changes. No definite acute bony abnormalities.   Original Report Authenticated By: Lavonia Dana, M.D.    Scheduled Meds: . allopurinol  100 mg Oral Daily  . clobetasol cream  1 application Topical BID  . enoxaparin (LOVENOX) injection  40 mg Subcutaneous Q24H  . glyBURIDE  2.5 mg Oral Q breakfast  . insulin aspart  0-9 Units Subcutaneous TID WC  . losartan  25 mg Oral Daily  . piperacillin-tazobactam (ZOSYN)  IV  3.375 g Intravenous Q8H  . sodium chloride  3 mL Intravenous Q12H  . vancomycin  1,000 mg Intravenous Q12H  . verapamil  240 mg Oral Daily   Continuous Infusions: . sodium chloride 75 mL/hr at 10/16/12 [id number]    Principal Problem:   Diabetic ulcer of left foot Active Problems:   HTN (hypertension)   Tobacco abuse   Diabetes mellitus    Time spent: 35 minutes.    Lelon Frohlich  [facility] Pager [phone]  If 7PM-7AM, please contact night-coverage at [email or web address], password [facility] 10/16/2012, 11:07 AM  LOS: 1 day

## 2012-10-17 ENCOUNTER — Inpatient Hospital Stay (HOSPITAL_COMMUNITY): Payer: Medicare Other

## 2012-10-17 DIAGNOSIS — M7989 Other specified soft tissue disorders: Secondary | ICD-10-CM

## 2012-10-17 LAB — BASIC METABOLIC PANEL
CO2: 25 mEq/L (ref 19–32)
Calcium: 8.4 mg/dL (ref 8.4–10.5)
Chloride: 105 mEq/L (ref 96–112)
Sodium: 138 mEq/L (ref 135–145)

## 2012-10-17 LAB — GLUCOSE, CAPILLARY
Glucose-Capillary: 140 mg/dL — ABNORMAL HIGH (ref 70–99)
Glucose-Capillary: 63 mg/dL — ABNORMAL LOW (ref 70–99)
Glucose-Capillary: 97 mg/dL (ref 70–99)

## 2012-10-17 LAB — CBC
MCV: 90.2 fL (ref 78.0–100.0)
Platelets: 234 10*3/uL (ref 150–400)
RBC: 3.46 MIL/uL — ABNORMAL LOW (ref 4.22–5.81)
WBC: 5.8 10*3/uL (ref 4.0–10.5)

## 2012-10-17 NOTE — Progress Notes (Addendum)
ANTIBIOTIC CONSULT NOTE - FOLLOW UP  Pharmacy Consult for Vancomycin/Zosyn Indication: Cellulitis   No Known Allergies  Patient Measurements: Height: 5\' 10"  (177.8 cm) Weight: 234 lb 2.1 oz (106.2 kg) IBW/kg (Calculated) : 73  Vital Signs: Temp: 97.1 F (36.2 C) (06/22 1400) BP: 82/48 mmHg (06/22 1400) Pulse Rate: 67 (06/22 1400) Intake/Output from previous day: 06/21 0701 - 06/22 0700 In: 560 [P.O.:560] Out: 1635 [Urine:1635] Intake/Output from this shift:    Labs:  Recent Labs  10/15/12 2026 10/16/12 0625 10/17/12 0558  WBC 6.0 6.3 5.8  HGB 11.8* 10.6* 10.4*  PLT 285 261 234  CREATININE 1.09 1.24 1.27   Estimated Creatinine Clearance: 52.9 ml/min (by C-G formula based on Cr of 1.27).  Recent Labs  10/17/12 1823  VANCOTROUGH 16.8     Microbiology: Recent Results (from the past 720 hour(s))  WOUND CULTURE     Status: None   Collection Time    10/15/12  8:04 PM      Result Value Range Status   Specimen Description WOUND LEFT FOOT   Final   Special Requests NONE   Final   Gram Stain     Final   Value: RARE WBC PRESENT,BOTH PMN AND MONONUCLEAR     NO SQUAMOUS EPITHELIAL CELLS SEEN     NO ORGANISMS SEEN   Culture Culture reincubated for better growth   Final   Report Status PENDING   Incomplete  CULTURE, BLOOD (ROUTINE X 2)     Status: None   Collection Time    10/15/12  8:15 PM      Result Value Range Status   Specimen Description BLOOD HAND LEFT   Final   Special Requests BOTTLES DRAWN AEROBIC ONLY 10CC   Final   Culture  Setup Time 10/16/2012 05:44   Final   Culture     Final   Value:        BLOOD CULTURE RECEIVED NO GROWTH TO DATE CULTURE WILL BE HELD FOR 5 DAYS BEFORE ISSUING A FINAL NEGATIVE REPORT   Report Status PENDING   Incomplete  CULTURE, BLOOD (ROUTINE X 2)     Status: None   Collection Time    10/15/12  8:30 PM      Result Value Range Status   Specimen Description BLOOD ARM LEFT   Final   Special Requests BOTTLES DRAWN AEROBIC ONLY  10CC   Final   Culture  Setup Time 10/16/2012 05:44   Final   Culture     Final   Value:        BLOOD CULTURE RECEIVED NO GROWTH TO DATE CULTURE WILL BE HELD FOR 5 DAYS BEFORE ISSUING A FINAL NEGATIVE REPORT   Report Status PENDING   Incomplete    Anti-infectives   Start     Dose/Rate Route Frequency Ordered Stop   10/16/12 0900  piperacillin-tazobactam (ZOSYN) IVPB 3.375 g     3.375 g 12.5 mL/hr over 240 Minutes Intravenous 3 times per day 10/16/12 0841     10/16/12 0800  vancomycin (VANCOCIN) IVPB 1000 mg/200 mL premix     1,000 mg 200 mL/hr over 60 Minutes Intravenous Every 12 hours 10/16/12 0053     10/16/12 0100  vancomycin (VANCOCIN) IVPB 1000 mg/200 mL premix  Status:  Discontinued     1,000 mg 200 mL/hr over 60 Minutes Intravenous Every 12 hours 10/16/12 0049 10/16/12 0050   10/15/12 2245  vancomycin (VANCOCIN) IVPB 1000 mg/200 mL premix     1,000 mg 200  mL/hr over 60 Minutes Intravenous  Once 10/15/12 2233 10/16/12 0015      Assessment: 79yo male has chronic BLE swelling and redness, now w/ ulcerated area of left foot with increased swelling and erythema of both feet, on D#2 vancomycin and zosyn. Wbc 6.3, afebrile, MRI neg for osteoor deep tissue infection, scr 1.27, est crcl ~ 50 ml/min. Vancomycin trough (16.8) is slightly above goal, but was drawn 1 hr earlier. True trough should be < 15 mcg/ml   Vanc 6/21>> Zosyn 6/21>>  6/20 wound- reincubated 6/20 BCx x2- ngtd   Goal of Therapy:  Vancomycin trough level 10-15 mcg/ml  Plan:  - Continue Vancomycin and zosyn at current dose, continue to monitor renal function and cultures.  Manley Mason 10/17/2012,8:06 PM

## 2012-10-17 NOTE — Progress Notes (Signed)
[facility] PROGRESS NOTE  [name] [name] [id number] DOB: 18-Aug-1926 DOA: 10/15/2012 PCP: No PCP Per Patient  Assessment/Plan: Diabetic Foot Ulcer, Left -On the dorsum of foot. -MRI pending to eval for possible osteomyelitis. -Continue vancomycin/Zosyn. -Remains afebrile and without leukocytosis. -Pain is better controlled with oxy IR. -Wound care consult requested.  DM -Well controlled except for one hypoglycemic episode. -Continue current regimen and adjust as needed.  HTN -Well controlled.  Code Status: Full code Family Communication: Patient only   Disposition Plan: To be determined.   Consultants:  None   Antibiotics:  Vanc day 3  Zosyn day 2  Subjective: No complaints.  Objective: Filed Vitals:   10/16/12 1529 10/16/12 1532 10/16/12 2059 10/17/12 0625  BP: 97/52 87/41 97/47  102/52  Pulse: 69  62 58  Temp: 98.2 F (36.8 C)  98.7 F (37.1 C) 98.5 F (36.9 C)  TempSrc: Oral  Oral Oral  Resp: 20  18 18   Height:      Weight:      SpO2: 100%  96% 96%    Intake/Output Summary (Last 24 hours) at 10/17/12 1318 Last data filed at 10/17/12 0900  Gross per 24 hour  Intake    360 ml  Output   1200 ml  Net   -840 ml   Filed Weights   10/15/12 2330  Weight: 106.2 kg (234 lb 2.1 oz)    Exam:   General:  awake  Cardiovascular: RRR, no M/R/G  Respiratory: CTA B  Abdomen: S/NT/ND/+BS  Extremities: Ulcer on dorsum of left foot, hyperkeratosis of bilateral lower extremities.   Neurologic:  Grossly intact and non-focal  Data Reviewed: Basic Metabolic Panel:  Recent Labs Lab 10/15/12 2026 10/16/12 0625 10/17/12 0558  NA 136 137 138  K 3.7 3.8 3.8  CL 102 103 105  CO2 21 24 25   GLUCOSE 96 108* 99  BUN 16 15 14   CREATININE 1.09 1.24 1.27  CALCIUM 9.6 8.3* 8.4   Liver Function Tests:  Recent Labs Lab 10/15/12 2026  AST 18  ALT 7  ALKPHOS 54  BILITOT 0.8  PROT 7.4  ALBUMIN 3.1*   No results found for this basename:  LIPASE, AMYLASE,  in the last 168 hours No results found for this basename: AMMONIA,  in the last 168 hours CBC:  Recent Labs Lab 10/15/12 2026 10/16/12 0625 10/17/12 0558  WBC 6.0 6.3 5.8  NEUTROABS 3.9  --   --   HGB 11.8* 10.6* 10.4*  HCT 34.5* 31.4* 31.2*  MCV 88.0 89.7 90.2  PLT 285 261 234   Cardiac Enzymes: No results found for this basename: CKTOTAL, CKMB, CKMBINDEX, TROPONINI,  in the last 168 hours BNP (last 3 results)  Recent Labs  08/05/12 1627  PROBNP 654.3*   CBG:  Recent Labs Lab 10/16/12 1623 10/16/12 1734 10/16/12 2054 10/17/12 0636 10/17/12 1114  GLUCAP 61* 134* 121* 110* 140*    Recent Results (from the past 240 hour(s))  WOUND CULTURE     Status: None   Collection Time    10/15/12  8:04 PM      Result Value Range Status   Specimen Description WOUND LEFT FOOT   Final   Special Requests NONE   Final   Gram Stain     Final   Value: RARE WBC PRESENT,BOTH PMN AND MONONUCLEAR     NO SQUAMOUS EPITHELIAL CELLS SEEN     NO ORGANISMS SEEN   Culture Culture reincubated for better growth   Final  Report Status PENDING   Incomplete     Studies: Dg Chest 2 View  10/15/2012   *RADIOLOGY REPORT*  Clinical Data: Chest pain and swelling of both legs.  CHEST - 2 VIEW  Comparison: 03/15/2006.  Findings: The heart is enlarged but stable.  There is mild tortuosity of the thoracic aorta.  Mild vascular congestion but no overt pulmonary edema.  No pleural effusions or focal infiltrates. The bony thorax is intact.  IMPRESSION: Cardiac enlargement and mild vascular congestion without overt pulmonary edema or pleural effusion.   Original Report Authenticated By: Marijo Sanes, M.D.   Mr Foot Left Wo Contrast  10/17/2012   *RADIOLOGY REPORT*  Clinical Data: Diabetic ulcer on the dorsum of the foot.  MRI OF THE LEFT FOREFOOT WITHOUT CONTRAST  Technique:  Multiplanar, multisequence MR imaging was performed. No intravenous contrast was administered.  Comparison:  Radiographs dated 10/15/2012  Findings: There is a superficial 3.1 x 2.6 x 0.6 cm ulceration on the dorsal lateral aspect of the distal forefoot overlying the distal third fourth and fifth metatarsals.  There is no abscess in the foot.  There is no osteomyelitis or septic joint.  There is slight arthritis of the first metatarsal phalangeal joint.  The flexor and extensor tendons appear normal.  There is edema in the soft tissues of the dorsum of the foot could represent cellulitis.  IMPRESSION:  1.  Soft tissue ulceration and adjacent edema consistent with cellulitis on the dorsum of the foot. 2.  No deep extension into the muscles, tendons, or bones. 3.  No abscesses.   Original Report Authenticated By: Lorriane Shire, M.D.   Dg Foot Complete Left  10/15/2012   *RADIOLOGY REPORT*  Clinical Data: Pain and swelling, chronic venous insufficiency, open wound anterior left foot  LEFT FOOT - COMPLETE 3+ VIEW  Comparison: 08/05/2012  Findings: Marked osseous demineralization. Scattered narrowing of interphalangeal joints as well as first MTP joint. More pronounced lucency at the heads of the fourth and fifth metatarsals likely related to overlying soft tissue wound. No acute fracture, dislocation or bone destruction. Specifically no definite evidence of osteomyelitis. Mild tibiotalar degenerative changes. Significant soft tissue swelling left foot.  IMPRESSION: Marked osseous demineralization. No definite evidence of osteomyelitis.   Original Report Authenticated By: Lavonia Dana, M.D.   Dg Foot Complete Right  10/15/2012   *RADIOLOGY REPORT*  Clinical Data: Pain and swelling right foot, chronic venous insufficiency, hypertension, diabetes  RIGHT FOOT COMPLETE - 3+ VIEW  Comparison: 08/05/2012  Findings: Marked soft tissue swelling particularly at dorsum of foot. Diffuse osseous demineralization. Scattered narrowing at interphalangeal joints and first MTP joint. No acute fracture or dislocation. No definite bone  destruction seen to suggest osteomyelitis. Tibiotalar and subtalar joint degenerative changes. Achilles insertion calcaneal spurring.  IMPRESSION: Osseous demineralization with scattered degenerative changes. No definite acute bony abnormalities.   Original Report Authenticated By: Lavonia Dana, M.D.    Scheduled Meds: . allopurinol  100 mg Oral Daily  . clobetasol cream  1 application Topical BID  . enoxaparin (LOVENOX) injection  40 mg Subcutaneous Q24H  . glyBURIDE  2.5 mg Oral Q breakfast  . insulin aspart  0-9 Units Subcutaneous TID WC  . losartan  25 mg Oral Daily  . piperacillin-tazobactam (ZOSYN)  IV  3.375 g Intravenous Q8H  . sodium chloride  3 mL Intravenous Q12H  . vancomycin  1,000 mg Intravenous Q12H  . verapamil  240 mg Oral Daily   Continuous Infusions:    Principal Problem:  Diabetic ulcer of left foot Active Problems:   HTN (hypertension)   Tobacco abuse   Diabetes mellitus    Time spent: 35 minutes.    Lelon Frohlich  [facility] Pager [phone]  If 7PM-7AM, please contact night-coverage at [email or web address], password [facility] 10/17/2012, 1:18 PM  LOS: 2 days

## 2012-10-17 NOTE — Progress Notes (Signed)
VASCULAR LAB PRELIMINARY  PRELIMINARY  PRELIMINARY  PRELIMINARY  Bilateral lower extremity venous Dopplers completed.    Preliminary report:  There is no DVT or SVT noted in the bilateral lower extremities.  Multiple enlarged vascularized lymph nodes noted in the left groin.  [name] [name], RVT 10/17/2012, 10:44 AM

## 2012-10-18 LAB — BASIC METABOLIC PANEL
GFR calc Af Amer: 64 mL/min — ABNORMAL LOW (ref >90)
GFR calc non Af Amer: 55 mL/min — ABNORMAL LOW (ref >90)
Glucose, Bld: 119 mg/dL — ABNORMAL HIGH (ref 70–99)
Potassium: 3.8 mEq/L (ref 3.5–5.1)
Sodium: 137 mEq/L (ref 135–145)

## 2012-10-18 LAB — GLUCOSE, CAPILLARY
Glucose-Capillary: 174 mg/dL — ABNORMAL HIGH (ref 70–99)
Glucose-Capillary: 85 mg/dL (ref 70–99)

## 2012-10-18 LAB — MRSA PCR SCREENING: MRSA by PCR: POSITIVE — AB

## 2012-10-18 LAB — CBC
Hemoglobin: 10.2 g/dL — ABNORMAL LOW (ref 13.0–17.0)
RBC: 3.41 MIL/uL — ABNORMAL LOW (ref 4.22–5.81)

## 2012-10-18 MED ORDER — DOXYCYCLINE HYCLATE 100 MG PO TBEC: 100.0000 mg | DELAYED_RELEASE_TABLET | Freq: Two times a day (BID) | ORAL | Status: DC

## 2012-10-18 NOTE — Discharge Summary (Signed)
Physician Discharge Summary  [name] [name] [id number] DOB: [birth year] DOA: 10/15/2012  PCP: No PCP Per Patient  Admit date: 10/15/2012 Discharge date: 10/18/2012  Time spent: Greater than 30 minutes  Recommendations for Outpatient Follow-up:  [facility] for wound care.   Discharge Diagnoses:  Principal Problem:   Diabetic ulcer of left foot Active Problems:   HTN (hypertension)   Tobacco abuse   Diabetes mellitus   Discharge Condition: Stable and improved.  Filed Weights   10/15/12 2330  Weight: 106.2 kg (234 lb 2.1 oz)    History of present illness:  Patient is an 79 y.o. male with known history of chronic lower extremity wound has been increased pain in the lower extremity with fluid seeping out of his right lower extremity and his left dorsal foot ulcer worsening with discharge. Patient is a poor historian. Patient in the ER had x-rays which did not show any features consistent with osteomyelitis but on admission was found to have increased drainage from left foot ulcer and right leg. Patient has been admitted for further management. Patient denies any chest pain or shortness of breath.   Hospital Course:   Diabetic Foot Ulcer, Left  -On the dorsum of foot.  -MRI negative for osteomyelitis and c/w cellulitis only.  -Has received 3 days of vancomycin/Zosyn, which will be transtioned over to PO doxycycline for 14 days at time of DC. -Remains afebrile and without leukocytosis.  -Wound care consult: Would recommend long term use of compression wraps or stockings. HHRN can manage this or refer to wound care center if believed appropriate.  DM  -Well controlled except for one hypoglycemic episode.  -Continue current regimen and adjust as needed in the OP setting.  HTN  -Well controlled   Procedures:  None   Consultations:  None  Discharge Instructions  Discharge Orders   Future Orders Complete By Expires     Diet - low sodium heart healthy  As directed      Discontinue IV  As directed     Increase activity slowly  As directed         Medication List    TAKE these medications       allopurinol 100 MG tablet  Commonly known as:  ZYLOPRIM  Take 100 mg by mouth daily.     clobetasol cream 0.05 %  Commonly known as:  TEMOVATE  Apply 1 application topically 2 (two) times daily.     doxycycline 100 MG EC tablet  Commonly known as:  DORYX  Take 1 tablet (100 mg total) by mouth 2 (two) times daily. For 14 days     glyBURIDE 2.5 MG tablet  Commonly known as:  DIABETA  Take 1 tablet (2.5 mg total) by mouth daily with breakfast.     indomethacin 25 MG capsule  Commonly known as:  INDOCIN  Take 25 mg by mouth 3 (three) times daily as needed (gout).     losartan 25 MG tablet  Commonly known as:  COZAAR  Take 25 mg by mouth daily.     verapamil 240 MG CR tablet  Commonly known as:  CALAN-SR  Take 240 mg by mouth daily.       No Known Allergies     Follow-up Information   Schedule an appointment as soon as possible for a visit in 2 weeks to follow up. (With your regular provider)        The results of significant diagnostics from this hospitalization (including imaging, microbiology, ancillary and laboratory)  are listed below for reference.    Significant Diagnostic Studies: Dg Chest 2 View  10/15/2012   *RADIOLOGY REPORT*  Clinical Data: Chest pain and swelling of both legs.  CHEST - 2 VIEW  Comparison: 03/15/2006.  Findings: The heart is enlarged but stable.  There is mild tortuosity of the thoracic aorta.  Mild vascular congestion but no overt pulmonary edema.  No pleural effusions or focal infiltrates. The bony thorax is intact.  IMPRESSION: Cardiac enlargement and mild vascular congestion without overt pulmonary edema or pleural effusion.   Original Report Authenticated By: Marijo Sanes, M.D.   Mr Foot Left Wo Contrast  10/17/2012   *RADIOLOGY REPORT*  Clinical Data: Diabetic ulcer on the dorsum of the foot.  MRI OF THE LEFT  FOREFOOT WITHOUT CONTRAST  Technique:  Multiplanar, multisequence MR imaging was performed. No intravenous contrast was administered.  Comparison: Radiographs dated 10/15/2012  Findings: There is a superficial 3.1 x 2.6 x 0.6 cm ulceration on the dorsal lateral aspect of the distal forefoot overlying the distal third fourth and fifth metatarsals.  There is no abscess in the foot.  There is no osteomyelitis or septic joint.  There is slight arthritis of the first metatarsal phalangeal joint.  The flexor and extensor tendons appear normal.  There is edema in the soft tissues of the dorsum of the foot could represent cellulitis.  IMPRESSION:  1.  Soft tissue ulceration and adjacent edema consistent with cellulitis on the dorsum of the foot. 2.  No deep extension into the muscles, tendons, or bones. 3.  No abscesses.   Original Report Authenticated By: Lorriane Shire, M.D.   Dg Foot Complete Left  10/15/2012   *RADIOLOGY REPORT*  Clinical Data: Pain and swelling, chronic venous insufficiency, open wound anterior left foot  LEFT FOOT - COMPLETE 3+ VIEW  Comparison: 08/05/2012  Findings: Marked osseous demineralization. Scattered narrowing of interphalangeal joints as well as first MTP joint. More pronounced lucency at the heads of the fourth and fifth metatarsals likely related to overlying soft tissue wound. No acute fracture, dislocation or bone destruction. Specifically no definite evidence of osteomyelitis. Mild tibiotalar degenerative changes. Significant soft tissue swelling left foot.  IMPRESSION: Marked osseous demineralization. No definite evidence of osteomyelitis.   Original Report Authenticated By: Lavonia Dana, M.D.   Dg Foot Complete Right  10/15/2012   *RADIOLOGY REPORT*  Clinical Data: Pain and swelling right foot, chronic venous insufficiency, hypertension, diabetes  RIGHT FOOT COMPLETE - 3+ VIEW  Comparison: 08/05/2012  Findings: Marked soft tissue swelling particularly at dorsum of foot. Diffuse  osseous demineralization. Scattered narrowing at interphalangeal joints and first MTP joint. No acute fracture or dislocation. No definite bone destruction seen to suggest osteomyelitis. Tibiotalar and subtalar joint degenerative changes. Achilles insertion calcaneal spurring.  IMPRESSION: Osseous demineralization with scattered degenerative changes. No definite acute bony abnormalities.   Original Report Authenticated By: Lavonia Dana, M.D.    Microbiology: Recent Results (from the past 240 hour(s))  WOUND CULTURE     Status: None   Collection Time    10/15/12  8:04 PM      Result Value Range Status   Specimen Description WOUND LEFT FOOT   Final   Special Requests NONE   Final   Gram Stain     Final   Value: RARE WBC PRESENT,BOTH PMN AND MONONUCLEAR     NO SQUAMOUS EPITHELIAL CELLS SEEN     NO ORGANISMS SEEN   Culture Culture reincubated for better growth   Final  Report Status PENDING   Incomplete  CULTURE, BLOOD (ROUTINE X 2)     Status: None   Collection Time    10/15/12  8:15 PM      Result Value Range Status   Specimen Description BLOOD HAND LEFT   Final   Special Requests BOTTLES DRAWN AEROBIC ONLY 10CC   Final   Culture  Setup Time 10/16/2012 05:44   Final   Culture     Final   Value:        BLOOD CULTURE RECEIVED NO GROWTH TO DATE CULTURE WILL BE HELD FOR 5 DAYS BEFORE ISSUING A FINAL NEGATIVE REPORT   Report Status PENDING   Incomplete  CULTURE, BLOOD (ROUTINE X 2)     Status: None   Collection Time    10/15/12  8:30 PM      Result Value Range Status   Specimen Description BLOOD ARM LEFT   Final   Special Requests BOTTLES DRAWN AEROBIC ONLY 10CC   Final   Culture  Setup Time 10/16/2012 05:44   Final   Culture     Final   Value:        BLOOD CULTURE RECEIVED NO GROWTH TO DATE CULTURE WILL BE HELD FOR 5 DAYS BEFORE ISSUING A FINAL NEGATIVE REPORT   Report Status PENDING   Incomplete  MRSA PCR SCREENING     Status: Abnormal   Collection Time    10/18/12 11:49 AM       Result Value Range Status   MRSA by PCR POSITIVE (*) NEGATIVE Final   Comment:            The GeneXpert MRSA Assay (FDA     approved for NASAL specimens     only), is one component of a     comprehensive MRSA colonization     surveillance program. It is not     intended to diagnose MRSA     infection nor to guide or     monitor treatment for     MRSA infections.     RESULT CALLED TO, READ BACK BY AND VERIFIED WITH:     HOWELL RN 14:00 10/18/12 ([email or web address])     Labs: Basic Metabolic Panel:  Recent Labs Lab 10/15/12 2026 10/16/12 0625 10/17/12 0558 10/18/12 0442  NA 136 137 138 137  K 3.7 3.8 3.8 3.8  CL 102 103 105 105  CO2 21 24 25 25   GLUCOSE 96 108* 99 119*  BUN 16 15 14 12   CREATININE 1.09 1.24 1.27 1.17  CALCIUM 9.6 8.3* 8.4 8.5   Liver Function Tests:  Recent Labs Lab 10/15/12 2026  AST 18  ALT 7  ALKPHOS 54  BILITOT 0.8  PROT 7.4  ALBUMIN 3.1*   No results found for this basename: LIPASE, AMYLASE,  in the last 168 hours No results found for this basename: AMMONIA,  in the last 168 hours CBC:  Recent Labs Lab 10/15/12 2026 10/16/12 0625 10/17/12 0558 10/18/12 0442  WBC 6.0 6.3 5.8 6.9  NEUTROABS 3.9  --   --   --   HGB 11.8* 10.6* 10.4* 10.2*  HCT 34.5* 31.4* 31.2* 30.7*  MCV 88.0 89.7 90.2 90.0  PLT 285 261 234 250   Cardiac Enzymes: No results found for this basename: CKTOTAL, CKMB, CKMBINDEX, TROPONINI,  in the last 168 hours BNP: BNP (last 3 results)  Recent Labs  08/05/12 1627  PROBNP 654.3*   CBG:  Recent Labs Lab 10/17/12 1602 10/17/12 2342 10/18/12 0153  10/18/12 0646 10/18/12 1155  GLUCAP 97 63* 174* 99 85       Signed:  [facility] [place] Pager: [phone] 10/18/2012, 3:31 PM

## 2012-10-18 NOTE — Progress Notes (Signed)
Orthopedic Tech Progress Note Patient Details:  [name] [name] Mar 05, 1927 [id number] Applied unna boots to bilateral lower extremities. Ortho Devices Type of Ortho Device: Haematologist Ortho Device/Splint Location: Bilateral lower extremities Ortho Device/Splint Interventions: Application   Darrol Poke 10/18/2012, 4:12 PM

## 2012-10-18 NOTE — Care Management Note (Signed)
    Page 1 of 1   10/18/2012     2:26:41 PM   CARE MANAGEMENT NOTE 10/18/2012  Patient:  [name] [name], [name] [name]   Account Number:  [id number]  Date Initiated:  10/18/2012  Documentation initiated by:  [facility]  Subjective/Objective Assessment:   admitted with diabetic ulcer left foot, possible cellulitis of rt leg  lives in a boarding house, has aide from [facility] through [facility]     Action/Plan:   plan return home with [facility]   Anticipated DC Date:  10/18/2012   Anticipated DC Plan:  [place]  CM consult      [place] Choice  Resumption Of Svcs/PTA Provider   Choice offered to / List presented to:  C-1 Patient        [place] arranged  HH-1 RN      [facility] agency  [place]   Status of service:  Completed, signed off Medicare Important Message given?   (If response is "NO", the following Medicare IM given date fields will be blank) Date Medicare IM given:   Date Additional Medicare IM given:    Discharge Disposition:  [place]  Per UR Regulation:    If discussed at Long Length of Stay Meetings, dates discussed:    Comments:  10/18/12 Spoke with patient about d/c plans, he lives in a boarding house, has [facility] with Haematologist and an Engineer, production through [employer]. Contacted D[place]ik [place] at [place], they are active with patient for [facility] and will see patient on 10/19/12. Fuller Plan RN, BSN, CCM

## 2012-10-18 NOTE — Consult Note (Signed)
WOC consult Note Reason for Consult: evaluation of LE wounds.  Pt known to this [place] from previous admission.  Has had management of his LE lymphedema and wounds with compression wraps, however they have been discontinued recently. Would recommend long term use of compression wraps or stockings to manage this patient in the outpatient setting, for that reason would recommend follow up appt with a wound care center at the time of discharge to avoid readmission for LE ulcerations  Wound type: venous stasis ulceration of the left lateral leg and associated ulceration from his lymphedema of the dorsal left foot.  Measurement: dorsal left lateral foot: 2cm x 4cm x 2cm ;  Lateral left calf: 5cm x 4cm  Wound bed: dorsal left lateral foot: yellow, fibrinous; left lateral calf: partial thickness skin loss, pink and moist  Drainage (amount, consistency, odor) heavy drainage from the dorsal foot wound, yellow, mildly purulent but no odor, minimal serous from the lateral wound Periwound: brawny edema of bilateral LE.  Dressing procedure/placement/frequency: resume Unna's boot bilaterally (paged ortho tech to notify).  Supplies ordered for ortho. Bedside nursing to place silver hydrofiber to the open area of the left lateral leg and the open wound of the left dorsal foot, cover with silicone foam.  These supplies have also been ordered, bedside nurse to place when they arrive then to notify ortho tech to place Unna's boots.   [facility] team will remain available if needed, will order Unna's boots to be changed 2x wk.  Ivannah Zody [place] RN,CWOCN [id number]

## 2012-10-18 NOTE — Progress Notes (Signed)
UR COMPLETED  

## 2012-10-20 LAB — WOUND CULTURE

## 2012-10-22 LAB — CULTURE, BLOOD (ROUTINE X 2): Culture: NO GROWTH

## 2012-11-15 ENCOUNTER — Encounter (HOSPITAL_COMMUNITY): Payer: Self-pay | Admitting: Family Medicine

## 2012-11-15 ENCOUNTER — Inpatient Hospital Stay (HOSPITAL_COMMUNITY): Payer: Medicare Other

## 2012-11-15 ENCOUNTER — Inpatient Hospital Stay (HOSPITAL_COMMUNITY)
Admission: EM | Admit: 2012-11-15 | Discharge: 2012-11-19 | DRG: 683 | Disposition: A | Discharge status: Skilled Nursing Facility | Payer: Medicare Other | Attending: Internal Medicine | Admitting: Internal Medicine

## 2012-11-15 DIAGNOSIS — L97529 Non-pressure chronic ulcer of other part of left foot with unspecified severity: Secondary | ICD-10-CM | POA: Diagnosis present

## 2012-11-15 DIAGNOSIS — I10 Essential (primary) hypertension: Secondary | ICD-10-CM

## 2012-11-15 DIAGNOSIS — F17200 Nicotine dependence, unspecified, uncomplicated: Secondary | ICD-10-CM | POA: Diagnosis present

## 2012-11-15 DIAGNOSIS — I5032 Chronic diastolic (congestive) heart failure: Secondary | ICD-10-CM | POA: Diagnosis present

## 2012-11-15 DIAGNOSIS — E11621 Type 2 diabetes mellitus with foot ulcer: Secondary | ICD-10-CM | POA: Diagnosis present

## 2012-11-15 DIAGNOSIS — L0390 Cellulitis, unspecified: Secondary | ICD-10-CM

## 2012-11-15 DIAGNOSIS — L03119 Cellulitis of unspecified part of limb: Secondary | ICD-10-CM | POA: Diagnosis present

## 2012-11-15 DIAGNOSIS — L02619 Cutaneous abscess of unspecified foot: Secondary | ICD-10-CM | POA: Diagnosis present

## 2012-11-15 DIAGNOSIS — M869 Osteomyelitis, unspecified: Secondary | ICD-10-CM | POA: Diagnosis present

## 2012-11-15 DIAGNOSIS — M9080 Osteopathy in diseases classified elsewhere, unspecified site: Secondary | ICD-10-CM | POA: Diagnosis present

## 2012-11-15 DIAGNOSIS — R000 Tachycardia, unspecified: Secondary | ICD-10-CM | POA: Diagnosis present

## 2012-11-15 DIAGNOSIS — W19XXXA Unspecified fall, initial encounter: Secondary | ICD-10-CM

## 2012-11-15 DIAGNOSIS — M810 Age-related osteoporosis without current pathological fracture: Secondary | ICD-10-CM

## 2012-11-15 DIAGNOSIS — L97509 Non-pressure chronic ulcer of other part of unspecified foot with unspecified severity: Secondary | ICD-10-CM

## 2012-11-15 DIAGNOSIS — I83009 Varicose veins of unspecified lower extremity with ulcer of unspecified site: Secondary | ICD-10-CM

## 2012-11-15 DIAGNOSIS — N182 Chronic kidney disease, stage 2 (mild): Secondary | ICD-10-CM | POA: Diagnosis present

## 2012-11-15 DIAGNOSIS — I129 Hypertensive chronic kidney disease with stage 1 through stage 4 chronic kidney disease, or unspecified chronic kidney disease: Secondary | ICD-10-CM | POA: Diagnosis present

## 2012-11-15 DIAGNOSIS — L0291 Cutaneous abscess, unspecified: Secondary | ICD-10-CM

## 2012-11-15 DIAGNOSIS — D649 Anemia, unspecified: Secondary | ICD-10-CM | POA: Diagnosis present

## 2012-11-15 DIAGNOSIS — E119 Type 2 diabetes mellitus without complications: Secondary | ICD-10-CM

## 2012-11-15 DIAGNOSIS — E860 Dehydration: Secondary | ICD-10-CM | POA: Diagnosis present

## 2012-11-15 DIAGNOSIS — N179 Acute kidney failure, unspecified: Principal | ICD-10-CM

## 2012-11-15 DIAGNOSIS — Z79899 Other long term (current) drug therapy: Secondary | ICD-10-CM

## 2012-11-15 DIAGNOSIS — E1169 Type 2 diabetes mellitus with other specified complication: Secondary | ICD-10-CM

## 2012-11-15 LAB — CBC
HCT: 33.1 % — ABNORMAL LOW (ref 39.0–52.0)
Hemoglobin: 11.4 g/dL — ABNORMAL LOW (ref 13.0–17.0)
MCHC: 34.4 g/dL (ref 30.0–36.0)
WBC: 7.5 10*3/uL (ref 4.0–10.5)

## 2012-11-15 LAB — COMPREHENSIVE METABOLIC PANEL
Alkaline Phosphatase: 88 U/L (ref 39–117)
BUN: 36 mg/dL — ABNORMAL HIGH (ref 6–23)
Chloride: 96 mEq/L (ref 96–112)
GFR calc Af Amer: 41 mL/min — ABNORMAL LOW (ref >90)
Glucose, Bld: 89 mg/dL (ref 70–99)
Potassium: 4.4 mEq/L (ref 3.5–5.1)
Total Bilirubin: 0.9 mg/dL (ref 0.3–1.2)

## 2012-11-15 LAB — TROPONIN I: Troponin I: <0.3 ng/mL (ref <0.30)

## 2012-11-15 LAB — CK TOTAL AND CKMB (NOT AT ARMC): Total CK: 421 U/L — ABNORMAL HIGH (ref 7–232)

## 2012-11-15 LAB — GLUCOSE, CAPILLARY: Glucose-Capillary: 89 mg/dL (ref 70–99)

## 2012-11-15 MED ORDER — VERAPAMIL HCL ER 240 MG PO TBCR
240.0000 mg | EXTENDED_RELEASE_TABLET | Freq: Every day | ORAL | Status: DC
  Administered 2012-11-16 – 2012-11-19 (×4): 240 mg via ORAL
  Filled 2012-11-15 (×4): qty 1

## 2012-11-15 MED ORDER — SODIUM CHLORIDE 0.9 % IJ SOLN
3.0000 mL | Freq: Two times a day (BID) | INTRAMUSCULAR | Status: DC
  Administered 2012-11-17 – 2012-11-19 (×3): 3 mL via INTRAVENOUS

## 2012-11-15 MED ORDER — VANCOMYCIN HCL 10 G IV SOLR
2000.0000 mg | INTRAVENOUS | Status: AC
  Administered 2012-11-15: 2000 mg via INTRAVENOUS
  Filled 2012-11-15: qty 2000

## 2012-11-15 MED ORDER — ACETAMINOPHEN 650 MG RE SUPP: 650.0000 mg | Freq: Four times a day (QID) | RECTAL | Status: DC | PRN

## 2012-11-15 MED ORDER — LOSARTAN POTASSIUM 25 MG PO TABS: 25.0000 mg | ORAL_TABLET | Freq: Every day | ORAL | Status: DC

## 2012-11-15 MED ORDER — ONDANSETRON HCL 4 MG PO TABS: 4.0000 mg | ORAL_TABLET | Freq: Four times a day (QID) | ORAL | Status: DC | PRN

## 2012-11-15 MED ORDER — ACETAMINOPHEN 325 MG PO TABS
650.0000 mg | ORAL_TABLET | Freq: Four times a day (QID) | ORAL | Status: DC | PRN
  Administered 2012-11-18: 650 mg via ORAL
  Filled 2012-11-15 (×2): qty 2

## 2012-11-15 MED ORDER — SODIUM CHLORIDE 0.9 % IV SOLN
INTRAVENOUS | Status: DC
  Administered 2012-11-16: 18:00:00 via INTRAVENOUS

## 2012-11-15 MED ORDER — ONDANSETRON HCL 4 MG/2ML IJ SOLN: 4.0000 mg | Freq: Four times a day (QID) | INTRAMUSCULAR | Status: DC | PRN

## 2012-11-15 MED ORDER — KETOCONAZOLE 2 % EX CREA
1.0000 "application " | TOPICAL_CREAM | CUTANEOUS | Status: DC | PRN
  Filled 2012-11-15: qty 15

## 2012-11-15 MED ORDER — ALLOPURINOL 100 MG PO TABS
100.0000 mg | ORAL_TABLET | Freq: Every day | ORAL | Status: DC
  Administered 2012-11-16 – 2012-11-19 (×4): 100 mg via ORAL
  Filled 2012-11-15 (×4): qty 1

## 2012-11-15 MED ORDER — INSULIN ASPART 100 UNIT/ML ~~LOC~~ SOLN
0.0000 [IU] | Freq: Three times a day (TID) | SUBCUTANEOUS | Status: DC
  Administered 2012-11-16: 13:00:00 via SUBCUTANEOUS

## 2012-11-15 MED ORDER — VANCOMYCIN HCL 10 G IV SOLR
1500.0000 mg | INTRAVENOUS | Status: DC
  Administered 2012-11-16 – 2012-11-18 (×3): 1500 mg via INTRAVENOUS
  Filled 2012-11-15 (×4): qty 1500

## 2012-11-15 MED ORDER — PIPERACILLIN-TAZOBACTAM 3.375 G IVPB
3.3750 g | Freq: Three times a day (TID) | INTRAVENOUS | Status: DC
  Administered 2012-11-16 – 2012-11-19 (×10): 3.375 g via INTRAVENOUS
  Filled 2012-11-15 (×13): qty 50

## 2012-11-15 MED ORDER — GLIMEPIRIDE 2 MG PO TABS
2.0000 mg | ORAL_TABLET | Freq: Every day | ORAL | Status: DC
  Administered 2012-11-16: 2 mg via ORAL
  Filled 2012-11-15 (×2): qty 1

## 2012-11-15 MED ORDER — PIPERACILLIN-TAZOBACTAM 3.375 G IVPB 30 MIN
3.3750 g | Freq: Once | INTRAVENOUS | Status: AC
  Administered 2012-11-15: 3.375 g via INTRAVENOUS
  Filled 2012-11-15: qty 50

## 2012-11-15 NOTE — ED Notes (Signed)
Per EMS, about 4 am woke up and laying on the floor. Call came from home health aid and she found him on the floor. Pt oriented per baseline. Denies any pain. Pt able to help EMS get him off the floor. Pulse 96. o2 97%. CBG 97. BP 104/67.

## 2012-11-15 NOTE — Progress Notes (Signed)
ANTIBIOTIC CONSULT NOTE - INITIAL  Pharmacy Consult for vancomycin + zosyn  Indication: r/o gangrene  No Known Allergies  Patient Measurements:   Adjusted Body Weight:   Vital Signs: Temp: 98.1 F (36.7 C) (07/21 1722) Temp src: Oral (07/21 1722) BP: 134/92 mmHg (07/21 1804) Pulse Rate: 93 (07/21 1804) Intake/Output from previous day:   Intake/Output from this shift:    Labs:  Recent Labs  11/15/12 1846  WBC 7.5  HGB 11.4*  PLT 335  CREATININE 1.70*   The CrCl is unknown because both a height and weight (above a minimum accepted value) are required for this calculation. No results found for this basename: VANCOTROUGH, Corlis Leak, VANCORANDOM, GENTTROUGH, GENTPEAK, GENTRANDOM, TOBRATROUGH, TOBRAPEAK, TOBRARND, AMIKACINPEAK, AMIKACINTROU, AMIKACIN,  in the last 72 hours   Microbiology: Recent Results (from the past 720 hour(s))  MRSA PCR SCREENING     Status: Abnormal   Collection Time    10/18/12 11:49 AM      Result Value Range Status   MRSA by PCR POSITIVE (*) NEGATIVE Final   Comment:            The GeneXpert MRSA Assay (FDA     approved for NASAL specimens     only), is one component of a     comprehensive MRSA colonization     surveillance program. It is not     intended to diagnose MRSA     infection nor to guide or     monitor treatment for     MRSA infections.     RESULT CALLED TO, READ BACK BY AND VERIFIED WITH:     HOWELL RN 14:00 10/18/12 ([email or web address])    Medical History: Past Medical History  Diagnosis Date  . Hypertension   . Lymphadenitis 08/10/2012  . Gout   . Chronic kidney disease     RENAL INSUFICCIENCY  . Diabetes mellitus without complication     Medications:  Anti-infectives   Start     Dose/Rate Route Frequency Ordered Stop   11/16/12 2200  vancomycin (VANCOCIN) 1,500 mg in sodium chloride 0.9 % 500 mL IVPB     1,500 mg 250 mL/hr over 120 Minutes Intravenous Every 24 hours 11/15/12 2020     11/16/12 0300  piperacillin-tazobactam  (ZOSYN) IVPB 3.375 g     3.375 g 12.5 mL/hr over 240 Minutes Intravenous Every 8 hours 11/15/12 2020     11/15/12 2030  vancomycin (VANCOCIN) 2,000 mg in sodium chloride 0.9 % 500 mL IVPB     2,000 mg 250 mL/hr over 120 Minutes Intravenous To Emergency Dept 11/15/12 2020 11/16/12 2030   11/15/12 2030  piperacillin-tazobactam (ZOSYN) IVPB 3.375 g     3.375 g 100 mL/hr over 30 Minutes Intravenous  Once 11/15/12 2020       Assessment: 21 yom found down after a possible fall. To start empiric vancomycin + zosyn for possible gangrene. Pt is currently afebrile and WBC is WNL. Scr is elevated at 1.7. Pt has not received any antibiotics in the ED so far.   Goal of Therapy:  Vancomycin trough level 15-20 mcg/ml  Plan:  1. Zosyn 3.375gm IV x 1 over 30 minutes then 3.375gm IV Q8H (4 hr inf) 2. Vancomycin 2gm IV x 1 then 1500mg  IV Q24H 3. F/u renal fxn, C&S, clinical status and trough at [facility], Rande Lawman 11/15/2012,8:21 PM
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
## 2012-11-15 NOTE — H&P (Addendum)
[facility] History and Physical  [name] [name] [id number] DOB: November 01, 1926 DOA: 11/15/2012  Referring physician: ER physician. PCP: No PCP Per Patient   Chief Complaint: Fall.  HPI: [name] [name] is a 79 y.o. male was brought to the ER from a boarding house after patient had a fall. Patient is a poor historian. Patient states he had a fall today when he was trying to get out of the bed. Denies having hit his head or lost consciousness. In addition patient was stating that he has increased drainage from his lower extremities. Patient denies any chest pain shortness of breath nausea vomiting abdominal pain fever chills diarrhea. In the ER patient's labs show worsening of his creatinine. In addition patient is mildly tachycardic and his wounds of his lower extremities show increased seepage. X-ray shows possibility of left foot osteomyelitis. Patient has been admitted for further management.  Review of Systems: As presented in the history of presenting illness, rest negative.  Past Medical History  Diagnosis Date  . Hypertension   . Lymphadenitis 08/10/2012  . Gout   . Chronic kidney disease     RENAL INSUFICCIENCY  . Diabetes mellitus without complication    History reviewed. No pertinent past surgical history. Social History:  reports that he has been smoking Pipe.  He has never used smokeless tobacco. He reports that he does not drink alcohol or use illicit drugs.  Boarding house. where does patient live-- Not sure. Can patient participate in ADLs?  No Known Allergies  History reviewed. No pertinent family history.    Prior to Admission medications   Medication Sig Start Date End Date Taking? Authorizing Provider  allopurinol (ZYLOPRIM) 100 MG tablet Take 100 mg by mouth daily.   Yes Historical Provider, MD  furosemide (LASIX) 40 MG tablet Take 40 mg by mouth daily.   Yes Historical Provider, MD  glimepiride (AMARYL) 2 MG tablet Take 2 mg by mouth daily before breakfast.   Yes  Historical Provider, MD  indomethacin (INDOCIN) 25 MG capsule Take 25 mg by mouth 3 (three) times daily as needed (gout).    Yes Historical Provider, MD  ketoconazole (NIZORAL) 2 % cream Apply 1 application topically as needed (for dry skin).   Yes Historical Provider, MD  losartan (COZAAR) 25 MG tablet Take 25 mg by mouth daily.   Yes Historical Provider, MD  verapamil (CALAN-SR) 240 MG CR tablet Take 240 mg by mouth daily.    Yes Historical Provider, MD   Physical Exam: Filed Vitals:   11/15/12 1722 11/15/12 1804 11/15/12 2000 11/15/12 2045  BP: 131/67 134/92 107/83   Pulse: 100 93  97  Temp: 98.1 F (36.7 C)     TempSrc: Oral     Resp: 16 16 24 25   SpO2: 96% 99% 98% 100%     General:  Well-developed and nourished.  Eyes: Anicteric no pallor.  ENT: No discharge from ears eyes nose mouth.  Neck: No mass felt.  Cardiovascular: S1-S2 heard tachycardic.  Respiratory: No rhonchi or crepitations.  Abdomen: Soft nontender bowel sounds present.  Skin: Left foot dorsum has large ulcer around 5 cm in diameter. Patient also has sacral decubitus ulcers. Right leg has chronic skin changes with fluid.  Musculoskeletal: As described in skin.  Psychiatric: Appears normal.  Neurologic: Alert awake oriented to name and place. Follows commands and moves all extremities.  Labs on Admission:  Basic Metabolic Panel:  Recent Labs Lab 11/15/12 1846  NA 133*  K 4.4  CL 96  CO2 25  GLUCOSE 89  BUN 36*  CREATININE 1.70*  CALCIUM 9.4   Liver Function Tests:  Recent Labs Lab 11/15/12 1846  AST 40*  ALT 18  ALKPHOS 88  BILITOT 0.9  PROT 7.7  ALBUMIN 2.4*   No results found for this basename: LIPASE, AMYLASE,  in the last 168 hours No results found for this basename: AMMONIA,  in the last 168 hours CBC:  Recent Labs Lab 11/15/12 1846  WBC 7.5  HGB 11.4*  HCT 33.1*  MCV 88.7  PLT 335   Cardiac Enzymes:  Recent Labs Lab 11/15/12 1846 11/15/12 2044  CKTOTAL 421*   --   CKMB 5.9*  --   TROPONINI  --  <0.30    BNP (last 3 results)  Recent Labs  08/05/12 1627  PROBNP 654.3*   CBG: No results found for this basename: GLUCAP,  in the last 168 hours  Radiological Exams on Admission: Dg Pelvis 1-2 Views  11/15/2012   *RADIOLOGY REPORT*  Clinical Data: Fall.  Bilateral lower extremity pain.  Left hip pain.  PELVIS - 1-2 VIEW  Comparison: None available.  Findings: A lucency over the left superior.  The ramus is related to overlying soft tissues.  No acute fracture is present. Degenerative changes are noted in the lower lumbar spine and bilateral SI joint.  Mild degenerative changes are present in the hips.  Densities within the anatomic pelvis likely represent phleboliths.  IMPRESSION:  1.  No acute abnormality. 2.  Degenerative changes in the lumbar spine, SI joints, and hips.   Original Report Authenticated By: San Morelle, M.D.   Dg Tibia/fibula Left  11/15/2012   *RADIOLOGY REPORT*  Clinical Data: Fall.  Left-sided pain.  Question osteomyelitis.  LEFT TIBIA AND FIBULA - 2 VIEW  Comparison: Left tibia and fibula radiographs 08/05/2012.  Findings: Degenerative changes are again noted at the knee and ankle.  Atherosclerotic changes are present.  Edematous changes in the lower extremity decreased.  No acute osseous abnormality is present.  IMPRESSION:  1.  Decreasing edematous changes in the lower extremity. 2.  Degenerative changes of the knee and ankle. 3.  Atherosclerosis.   Original Report Authenticated By: San Morelle, M.D.   Dg Tibia/fibula Right  11/15/2012   *RADIOLOGY REPORT*  Clinical Data: Fall.  Pain in both legs.  RIGHT TIBIA AND FIBULA - 2 VIEW  Comparison: Right tibia and fibula films 08/05/2012.  Findings: Chronic degenerative changes are present at the knee and ankle joints.  Edematous changes are present anterior to the tibia. Edema is reduced compared to the prior study.  Vascular calcifications are present.  No acute  abnormality is evident.  IMPRESSION:  1.  No acute osseous abnormality. 2.  Decreased edema. 3.  Atherosclerosis. 4.  Degenerative changes at the knee and ankle.   Original Report Authenticated By: San Morelle, M.D.   Ct Head Wo Contrast  11/15/2012   *RADIOLOGY REPORT*  Clinical Data: Fall  CT HEAD WITHOUT CONTRAST  Technique:  Contiguous axial images were obtained from the base of the skull through the vertex without contrast.  Comparison: Prior CT from 03/15/2006  Findings: Diffuse prominence of the CSF containing spaces is again seen, consistent with generalized atrophy. Scattered and confluent hypodensity within the periventricular white matter is consistent with chronic small vessel ischemic changes.  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  No acute intracranial hemorrhage or infarct.  No extra-axial fluid collection.  The calvarium  is intact.  Paranasal sinuses and mastoid air cells are clear.  IMPRESSION: 1.  Atrophy with chronic small vessel ischemic changes.  No acute intracranial hemorrhage or infarct. 2.  Unchanged remote right basal ganglia and periventricular white matter lacunar infarcts.   Original Report Authenticated By: Jeannine Boga, M.D.   Dg Foot Complete Left  11/15/2012   *RADIOLOGY REPORT*  Clinical Data: Osteomyelitis.  LEFT FOOT - COMPLETE 3+ VIEW  Comparison: Left foot radiographs 10/15/2012.  MRI of the left foot 10/17/2012.  Findings: Progressive lucency is evident within the metatarsal head as, concerning for erosions and osteomyelitis.  Persistent dorsal soft tissue swelling is evident, compatible with cellulitis. Diffuse osteopenia is evident.  IMPRESSION:  1.  Progressive lucency in the metatarsal heads concerning for developing osteomyelitis. 2.  Persistent soft tissue swelling over the dorsum of the foot compatible with cellulitis.   Original Report Authenticated By: San Morelle, M.D.     EKG: Independently reviewed. Normal sinus rhythm. Sinus tachycardia.  Assessment/Plan Principal Problem:   Acute renal failure Active Problems:   Cellulitis   Diabetic ulcer of left foot   Fall   1. Acute renal failure on chronic kidney disease - at this time we will hold off Lasix Cozaar and indomethacin. Gently hydrate. Check UA. Closely follow metabolic panel intake and output. 2. Fall - suspect this could be due to orthostatic changes. Gently hydrate and check orthostatic. Check d-dimer. 3. Cellulitis of the lower extremities with possible osteomyelitis of the left foot - patient has been placed on vancomycin and Zosyn. Check MRI of the left foot. Consult orthopedics in a.m. 4. Diabetes mellitus type 2 - closely follow CBGs with sliding-scale. 5. Hypertension - Lasix and Cozaar is on hold. Patient has been placed on when necessary IV hydralazine for systolic blood pressure more than 160. 6. Anemia - closely follow CBC. 7. Decubitus ulcers -  wound consult requested.   I have requested physical therapy consult. Patient may need advanced level of care once discharged.    Code Status: Full code.  Family Communication: None.  Disposition Plan: Admit to inpatient.    Jahniyah Revere N. [facility] Pager [phone].  If 7PM-7AM, please contact night-coverage [email or web address] Password [facility] 11/15/2012, 10:17 PM

## 2012-11-16 ENCOUNTER — Inpatient Hospital Stay (HOSPITAL_COMMUNITY): Payer: Medicare Other

## 2012-11-16 DIAGNOSIS — M79609 Pain in unspecified limb: Secondary | ICD-10-CM

## 2012-11-16 DIAGNOSIS — M810 Age-related osteoporosis without current pathological fracture: Secondary | ICD-10-CM

## 2012-11-16 DIAGNOSIS — I10 Essential (primary) hypertension: Secondary | ICD-10-CM

## 2012-11-16 LAB — URINALYSIS, ROUTINE W REFLEX MICROSCOPIC
Hgb urine dipstick: NEGATIVE
Specific Gravity, Urine: 1.018 (ref 1.005–1.030)
Urobilinogen, UA: 0.2 mg/dL (ref 0.0–1.0)
pH: 5 (ref 5.0–8.0)

## 2012-11-16 LAB — CBC WITH DIFFERENTIAL/PLATELET
HCT: 31.4 % — ABNORMAL LOW (ref 39.0–52.0)
Hemoglobin: 10.5 g/dL — ABNORMAL LOW (ref 13.0–17.0)
Lymphocytes Relative: 11 % — ABNORMAL LOW (ref 12–46)
Lymphs Abs: 1 10*3/uL (ref 0.7–4.0)
Monocytes Absolute: 1.1 10*3/uL — ABNORMAL HIGH (ref 0.1–1.0)
Monocytes Relative: 12 % (ref 3–12)
Neutro Abs: 7.2 10*3/uL (ref 1.7–7.7)
WBC: 9.5 10*3/uL (ref 4.0–10.5)

## 2012-11-16 LAB — GLUCOSE, CAPILLARY
Glucose-Capillary: 83 mg/dL (ref 70–99)
Glucose-Capillary: 131 mg/dL — ABNORMAL HIGH (ref 70–99)

## 2012-11-16 LAB — COMPREHENSIVE METABOLIC PANEL
BUN: 30 mg/dL — ABNORMAL HIGH (ref 6–23)
Calcium: 9.1 mg/dL (ref 8.4–10.5)
GFR calc Af Amer: 45 mL/min — ABNORMAL LOW (ref >90)
Glucose, Bld: 96 mg/dL (ref 70–99)
Sodium: 137 mEq/L (ref 135–145)
Total Protein: 6.9 g/dL (ref 6.0–8.3)

## 2012-11-16 MED ORDER — DEXTROSE 50 % IV SOLN
25.0000 mL | Freq: Once | INTRAVENOUS | Status: AC | PRN
  Administered 2012-11-16: 25 mL via INTRAVENOUS

## 2012-11-16 MED ORDER — CHLORHEXIDINE GLUCONATE CLOTH 2 % EX PADS
6.0000 | MEDICATED_PAD | Freq: Every day | CUTANEOUS | Status: DC
  Administered 2012-11-16 – 2012-11-19 (×4): 6 via TOPICAL

## 2012-11-16 MED ORDER — MORPHINE SULFATE 2 MG/ML IJ SOLN
2.0000 mg | INTRAMUSCULAR | Status: DC | PRN
  Administered 2012-11-16 – 2012-11-18 (×5): 2 mg via INTRAVENOUS
  Filled 2012-11-16 (×4): qty 1

## 2012-11-16 MED ORDER — ENOXAPARIN SODIUM 40 MG/0.4ML ~~LOC~~ SOLN
40.0000 mg | SUBCUTANEOUS | Status: DC
  Administered 2012-11-16 – 2012-11-18 (×3): 40 mg via SUBCUTANEOUS
  Filled 2012-11-16 (×4): qty 0.4

## 2012-11-16 MED ORDER — HYDRALAZINE HCL 20 MG/ML IJ SOLN: 10.0000 mg | INTRAMUSCULAR | Status: DC | PRN

## 2012-11-16 MED ORDER — MORPHINE SULFATE 2 MG/ML IJ SOLN: 2.0000 mg | INTRAMUSCULAR | Status: DC | PRN

## 2012-11-16 MED ORDER — ENOXAPARIN SODIUM 40 MG/0.4ML ~~LOC~~ SOLN
40.0000 mg | SUBCUTANEOUS | Status: DC
  Administered 2012-11-16: 40 mg via SUBCUTANEOUS
  Filled 2012-11-16: qty 0.4

## 2012-11-16 MED ORDER — LORAZEPAM 2 MG/ML IJ SOLN
1.0000 mg | Freq: Once | INTRAMUSCULAR | Status: AC
Start: 2012-11-16 — End: 2012-11-16
  Administered 2012-11-16: 1 mg via INTRAVENOUS

## 2012-11-16 MED ORDER — HYDROCERIN EX CREA
TOPICAL_CREAM | Freq: Two times a day (BID) | CUTANEOUS | Status: DC
  Administered 2012-11-16 – 2012-11-18 (×6): via TOPICAL
  Filled 2012-11-16 (×3): qty 113

## 2012-11-16 MED ORDER — MORPHINE SULFATE 4 MG/ML IJ SOLN
INTRAMUSCULAR | Status: AC
  Administered 2012-11-16: 08:00:00
  Filled 2012-11-16: qty 1

## 2012-11-16 MED ORDER — MUPIROCIN 2 % EX OINT
1.0000 "application " | TOPICAL_OINTMENT | Freq: Two times a day (BID) | CUTANEOUS | Status: DC
  Administered 2012-11-16 – 2012-11-19 (×8): 1 via NASAL
  Filled 2012-11-16 (×2): qty 22

## 2012-11-16 MED ORDER — LORAZEPAM 2 MG/ML IJ SOLN
INTRAMUSCULAR | Status: AC
  Administered 2012-11-16: 08:00:00
  Filled 2012-11-16: qty 1

## 2012-11-16 MED ORDER — DEXTROSE-NACL 5-0.45 % IV SOLN
INTRAVENOUS | Status: DC
  Administered 2012-11-16 – 2012-11-17 (×2): via INTRAVENOUS

## 2012-11-16 MED ORDER — DEXTROSE 50 % IV SOLN
INTRAVENOUS | Status: AC
  Filled 2012-11-16: qty 50

## 2012-11-16 NOTE — ED Provider Notes (Signed)
Medical screening examination/treatment/procedure(s) were conducted as a shared visit with non-physician practitioner(s) and myself.  I personally evaluated the patient during the encounter Elderly man living in a boarding house.  Has decubitus ulcers on his sacrum and severe venous stasis changes on both lower legs with an ulcer on the dorsum of the left foot and weeping lesions on the right calf, with feet smelling like gangrene.  Needs IV antibiotics and local wound care while hospitalized, and placement in a facility that can care for him on an outpatient basis.  He cannot care for himself due to dementia.  Mylinda Latina III, MD 11/16/12 [phone]

## 2012-11-16 NOTE — Consult Note (Signed)
Reason for Consult: Left foot ulceration Referring Physician: Dr Elby Beck is an 79 y.o. male.  HPI: [name] [name] is an 79 year old patient with multiple medical problems. He is a poor historian. Orthopedic surgeries consult to evaluate left foot ulceration and possibility of osteomyelitis in the left foot. Patient does not know how long the "hole in his foot" has been there.  Past Medical History  Diagnosis Date  . Hypertension   . Lymphadenitis 08/10/2012  . Gout   . Chronic kidney disease     RENAL INSUFICCIENCY  . Diabetes mellitus without complication     History reviewed. No pertinent past surgical history.  History reviewed. No pertinent family history.  Social History:  reports that he has been smoking Pipe.  He has never used smokeless tobacco. He reports that he does not drink alcohol or use illicit drugs.  Allergies: No Known Allergies  Medications: I have reviewed the patient's current medications.  Results for orders placed during the hospital encounter of 11/15/12 (from the past 48 hour(s))  WOUND CULTURE     Status: None   Collection Time    11/15/12 11:21 AM      Result Value Range   Specimen Description WOUND LEFT LEG     Special Requests NONE     Gram Stain       Value: RARE WBC PRESENT, PREDOMINANTLY PMN     NO SQUAMOUS EPITHELIAL CELLS SEEN     MODERATE GRAM NEGATIVE RODS     FEW GRAM POSITIVE COCCI     IN PAIRS   Culture PENDING     Report Status PENDING    CBC     Status: Abnormal   Collection Time    11/15/12  6:46 PM      Result Value Range   WBC 7.5  4.0 - 10.5 K/uL   RBC 3.73 (*) 4.22 - 5.81 MIL/uL   Hemoglobin 11.4 (*) 13.0 - 17.0 g/dL   HCT 33.1 (*) 39.0 - 52.0 %   MCV 88.7  78.0 - 100.0 fL   MCH 30.6  26.0 - 34.0 pg   MCHC 34.4  30.0 - 36.0 g/dL   RDW 14.5  11.5 - 15.5 %   Platelets 335  150 - 400 K/uL  COMPREHENSIVE METABOLIC PANEL     Status: Abnormal   Collection Time    11/15/12  6:46 PM      Result Value Range   Sodium  133 (*) 135 - 145 mEq/L   Potassium 4.4  3.5 - 5.1 mEq/L   Chloride 96  96 - 112 mEq/L   CO2 25  19 - 32 mEq/L   Glucose, Bld 89  70 - 99 mg/dL   BUN 36 (*) 6 - 23 mg/dL   Creatinine, Ser 1.70 (*) 0.50 - 1.35 mg/dL   Calcium 9.4  8.4 - 10.5 mg/dL   Total Protein 7.7  6.0 - 8.3 g/dL   Albumin 2.4 (*) 3.5 - 5.2 g/dL   AST 40 (*) 0 - 37 U/L   ALT 18  0 - 53 U/L   Alkaline Phosphatase 88  39 - 117 U/L   Total Bilirubin 0.9  0.3 - 1.2 mg/dL   GFR calc non Af Amer 35 (*) >90 mL/min   GFR calc Af Amer 41 (*) >90 mL/min   Comment:            The eGFR has been calculated     using the CKD EPI equation.  This calculation has not been     validated in all clinical     situations.     eGFR's persistently     <90 mL/min signify     possible Chronic Kidney Disease.  CK TOTAL AND CKMB     Status: Abnormal   Collection Time    11/15/12  6:46 PM      Result Value Range   Total CK 421 (*) 7 - 232 U/L   CK, MB 5.9 (*) 0.3 - 4.0 ng/mL   Relative Index 1.4  0.0 - 2.5  TROPONIN I     Status: None   Collection Time    11/15/12  8:44 PM      Result Value Range   Troponin I <0.30  <0.30 ng/mL   Comment:            Due to the release kinetics of cTnI,     a negative result within the first hours     of the onset of symptoms does not rule out     myocardial infarction with certainty.     If myocardial infarction is still suspected,     repeat the test at appropriate intervals.  MRSA PCR SCREENING     Status: Abnormal   Collection Time    11/15/12 10:21 PM      Result Value Range   MRSA by PCR POSITIVE (*) NEGATIVE   Comment:            The GeneXpert MRSA Assay (FDA     approved for NASAL specimens     only), is one component of a     comprehensive MRSA colonization     surveillance program. It is not     intended to diagnose MRSA     infection nor to guide or     monitor treatment for     MRSA infections.     RESULT CALLED TO, READ BACK BY AND VERIFIED WITH:     RN W.JACKSON BY  L.PITT AT 0056 11/16/12  GLUCOSE, CAPILLARY     Status: None   Collection Time    11/15/12 11:35 PM      Result Value Range   Glucose-Capillary 89  70 - 99 mg/dL  URINALYSIS, ROUTINE W REFLEX MICROSCOPIC     Status: Abnormal   Collection Time    11/16/12  3:45 AM      Result Value Range   Color, Urine YELLOW  YELLOW   APPearance CLEAR  CLEAR   Specific Gravity, Urine 1.018  1.005 - 1.030   pH 5.0  5.0 - 8.0   Glucose, UA NEGATIVE  NEGATIVE mg/dL   Hgb urine dipstick NEGATIVE  NEGATIVE   Bilirubin Urine NEGATIVE  NEGATIVE   Ketones, ur 15 (*) NEGATIVE mg/dL   Protein, ur NEGATIVE  NEGATIVE mg/dL   Urobilinogen, UA 0.2  0.0 - 1.0 mg/dL   Nitrite NEGATIVE  NEGATIVE   Leukocytes, UA NEGATIVE  NEGATIVE   Comment: MICROSCOPIC NOT DONE ON URINES WITH NEGATIVE PROTEIN, BLOOD, LEUKOCYTES, NITRITE, OR GLUCOSE <1000 mg/dL.  COMPREHENSIVE METABOLIC PANEL     Status: Abnormal   Collection Time    11/16/12  5:15 AM      Result Value Range   Sodium 137  135 - 145 mEq/L   Potassium 3.9  3.5 - 5.1 mEq/L   Chloride 100  96 - 112 mEq/L   CO2 24  19 - 32 mEq/L   Glucose, Bld 96  70 - 99 mg/dL   BUN 30 (*) 6 - 23 mg/dL   Creatinine, Ser 1.57 (*) 0.50 - 1.35 mg/dL   Calcium 9.1  8.4 - 10.5 mg/dL   Total Protein 6.9  6.0 - 8.3 g/dL   Albumin 2.4 (*) 3.5 - 5.2 g/dL   AST 32  0 - 37 U/L   ALT 15  0 - 53 U/L   Alkaline Phosphatase 82  39 - 117 U/L   Total Bilirubin 1.1  0.3 - 1.2 mg/dL   GFR calc non Af Amer 39 (*) >90 mL/min   GFR calc Af Amer 45 (*) >90 mL/min   Comment:            The eGFR has been calculated     using the CKD EPI equation.     This calculation has not been     validated in all clinical     situations.     eGFR's persistently     <90 mL/min signify     possible Chronic Kidney Disease.  CBC WITH DIFFERENTIAL     Status: Abnormal   Collection Time    11/16/12  5:15 AM      Result Value Range   WBC 9.5  4.0 - 10.5 K/uL   RBC 3.54 (*) 4.22 - 5.81 MIL/uL   Hemoglobin  10.5 (*) 13.0 - 17.0 g/dL   HCT 31.4 (*) 39.0 - 52.0 %   MCV 88.7  78.0 - 100.0 fL   MCH 29.7  26.0 - 34.0 pg   MCHC 33.4  30.0 - 36.0 g/dL   RDW 14.5  11.5 - 15.5 %   Platelets 334  150 - 400 K/uL   Neutrophils Relative % 76  43 - 77 %   Neutro Abs 7.2  1.7 - 7.7 K/uL   Lymphocytes Relative 11 (*) 12 - 46 %   Lymphs Abs 1.0  0.7 - 4.0 K/uL   Monocytes Relative 12  3 - 12 %   Monocytes Absolute 1.1 (*) 0.1 - 1.0 K/uL   Eosinophils Relative 1  0 - 5 %   Eosinophils Absolute 0.1  0.0 - 0.7 K/uL   Basophils Relative 0  0 - 1 %   Basophils Absolute 0.0  0.0 - 0.1 K/uL  D-DIMER, QUANTITATIVE     Status: Abnormal   Collection Time    11/16/12  5:15 AM      Result Value Range   D-Dimer, Quant 3.39 (*) 0.00 - 0.48 ug/mL-FEU   Comment:            AT THE INHOUSE ESTABLISHED CUTOFF     VALUE OF 0.48 ug/mL FEU,     THIS ASSAY HAS BEEN DOCUMENTED     IN THE LITERATURE TO HAVE     A SENSITIVITY AND NEGATIVE     PREDICTIVE VALUE OF AT LEAST     98 TO 99%.  THE TEST RESULT     SHOULD BE CORRELATED WITH     AN ASSESSMENT OF THE CLINICAL     PROBABILITY OF DVT / VTE.  GLUCOSE, CAPILLARY     Status: None   Collection Time    11/16/12  9:19 AM      Result Value Range   Glucose-Capillary 83  70 - 99 mg/dL  GLUCOSE, CAPILLARY     Status: Abnormal   Collection Time    11/16/12 12:07 PM      Result Value Range   Glucose-Capillary 131 (*)  70 - 99 mg/dL    Dg Pelvis 1-2 Views  11/15/2012   *RADIOLOGY REPORT*  Clinical Data: Fall.  Bilateral lower extremity pain.  Left hip pain.  PELVIS - 1-2 VIEW  Comparison: None available.  Findings: A lucency over the left superior.  The ramus is related to overlying soft tissues.  No acute fracture is present. Degenerative changes are noted in the lower lumbar spine and bilateral SI joint.  Mild degenerative changes are present in the hips.  Densities within the anatomic pelvis likely represent phleboliths.  IMPRESSION:  1.  No acute abnormality. 2.   Degenerative changes in the lumbar spine, SI joints, and hips.   Original Report Authenticated By: San Morelle, M.D.   Dg Tibia/fibula Left  11/15/2012   *RADIOLOGY REPORT*  Clinical Data: Fall.  Left-sided pain.  Question osteomyelitis.  LEFT TIBIA AND FIBULA - 2 VIEW  Comparison: Left tibia and fibula radiographs 08/05/2012.  Findings: Degenerative changes are again noted at the knee and ankle.  Atherosclerotic changes are present.  Edematous changes in the lower extremity decreased.  No acute osseous abnormality is present.  IMPRESSION:  1.  Decreasing edematous changes in the lower extremity. 2.  Degenerative changes of the knee and ankle. 3.  Atherosclerosis.   Original Report Authenticated By: San Morelle, M.D.   Dg Tibia/fibula Right  11/15/2012   *RADIOLOGY REPORT*  Clinical Data: Fall.  Pain in both legs.  RIGHT TIBIA AND FIBULA - 2 VIEW  Comparison: Right tibia and fibula films 08/05/2012.  Findings: Chronic degenerative changes are present at the knee and ankle joints.  Edematous changes are present anterior to the tibia. Edema is reduced compared to the prior study.  Vascular calcifications are present.  No acute abnormality is evident.  IMPRESSION:  1.  No acute osseous abnormality. 2.  Decreased edema. 3.  Atherosclerosis. 4.  Degenerative changes at the knee and ankle.   Original Report Authenticated By: San Morelle, M.D.   Ct Head Wo Contrast  11/15/2012   *RADIOLOGY REPORT*  Clinical Data: Fall  CT HEAD WITHOUT CONTRAST  Technique:  Contiguous axial images were obtained from the base of the skull through the vertex without contrast.  Comparison: Prior CT from 03/15/2006  Findings: Diffuse prominence of the CSF containing spaces is again seen, consistent with generalized atrophy. Scattered and confluent hypodensity within the periventricular white matter is consistent with chronic small vessel ischemic changes.  Remote right basal ganglia and periventricular white  matter lacunar infarcts are again noted, unchanged.  No acute intracranial hemorrhage or infarct.  No extra-axial fluid collection.  The calvarium is intact.  Paranasal sinuses and mastoid air cells are clear.  IMPRESSION: 1.  Atrophy with chronic small vessel ischemic changes.  No acute intracranial hemorrhage or infarct. 2.  Unchanged remote right basal ganglia and periventricular white matter lacunar infarcts.   Original Report Authenticated By: Jeannine Boga, M.D.   Dg Foot Complete Left  11/15/2012   *RADIOLOGY REPORT*  Clinical Data: Osteomyelitis.  LEFT FOOT - COMPLETE 3+ VIEW  Comparison: Left foot radiographs 10/15/2012.  MRI of the left foot 10/17/2012.  Findings: Progressive lucency is evident within the metatarsal head as, concerning for erosions and osteomyelitis.  Persistent dorsal soft tissue swelling is evident, compatible with cellulitis. Diffuse osteopenia is evident.  IMPRESSION:  1.  Progressive lucency in the metatarsal heads concerning for developing osteomyelitis. 2.  Persistent soft tissue swelling over the dorsum of the foot compatible with cellulitis.   Original Report Authenticated By:  San Morelle, M.D.    Review of Systems  Unable to perform ROS  Blood pressure 115/62, pulse 82, temperature 98.4 F (36.9 C), temperature source Oral, resp. rate 16, height 5\' 10"  (1.778 m), weight 102.059 kg (225 lb), SpO2 98.00%. Physical Exam  Constitutional: He appears well-developed.  HENT:  Head: Atraumatic.  Eyes: Pupils are equal, round, and reactive to light.  Neck: Normal range of motion.  Cardiovascular: Normal rate.   Respiratory: Effort normal.  Skin: Rash noted.   left lower Ebony Hail he demonstrates ulceration between the fourth and fifth metatarsal heads. This is approximately 1 x 2 cm. Tender to palpation. Pedal pulses not palpable. Foot appears not very well perfused. The ulceration does not penetrate to any palpable bone. Patient has scaly degeneration of  the skin up the leg. Patient also has similar type of scaliness going on and his head and chin region. Compartments are soft in the left leg.  Assessment/Plan: Impression is dorsal ulcer left foot with probable end-stage peripheral vascular disease affecting the lower extremity. Patient would not cooperate with Doppler study to evaluate ABIs. Patient also could not tolerate MRI scan to evaluate the foot. I discussed with radiologist the findings on plain x-ray of his foot. Although significant bony demineralization is present no definite os myelitis present particularly compared to films from 2-3 months ago. Impression is left foot dorsal ulceration which is not penetrate on exam to the bone. End stage peripheral acid disease is present. He really needs Doppler study and MRI to evaluate if this is a surgical problem. Agree with wound care evaluation for now. I would resume Lovenox for now as well until the studies are complete. Again the radiographic interpretation of os myelitis is not definite and is not not yet toward surgical intervention until ABIs and MRI scan are completed  Taunja Brickner SCOTT 11/16/2012, 5:37 PM

## 2012-11-16 NOTE — Progress Notes (Addendum)
Hypoglycemic Event  CBG: 64  Treatment: D50 IV 25 mL  Symptoms: Sweaty  Follow-up CBG: Time: [id number] CBG Result: 108  Possible Reasons for Event: Inadequate meal intake  Comments/MD notified:Pt refuses to eat    [name] [name]  Remember to initiate Hypoglycemia Order Set & complete

## 2012-11-16 NOTE — Plan of Care (Signed)
Problem: Phase I Progression Outcomes Goal: Initial discharge plan identified Outcome: Completed/Met Date Met:  11/16/12 Needed SNF placement. Goal: Other Phase I Outcomes/Goals Outcome: Completed/Met Date Met:  11/16/12 WOC pending

## 2012-11-16 NOTE — Consult Note (Signed)
X-ray of foot indicates possible osteomyelitis.  This is beyond [facility] scope of practice.  Please consult ortho service for further plan of care.  Thank-you, [place], [place]

## 2012-11-16 NOTE — Evaluation (Signed)
Physical Therapy Evaluation Patient Details Name: [name] [name] MRN: [id number] DOB: 1926/09/30 Today's Date: 11/16/2012 Time: [id number] PT Time Calculation (min): 20 min  PT Assessment / Plan / Recommendation History of Present Illness  Pt adm with acute renal failure after being found down at home.  Pt also with chronic leg ulcers and now with possible ostoemyelitis.  Clinical Impression  Pt admitted with above. Pt currently with functional limitations due to the deficits listed below (see PT Problem List) and lethargy. Pt will benefit from skilled PT to increase their independence and safety with mobility to allow discharge to the venue listed below.       PT Assessment  Patient needs continued PT services    Follow Up Recommendations  SNF    Does the patient have the potential to tolerate intense rehabilitation      Barriers to Discharge Decreased caregiver support      Equipment Recommendations  None recommended by PT    Recommendations for Other Services     Frequency Min 3X/week    Precautions / Restrictions Precautions Precautions: Fall   Pertinent Vitals/Pain No signs of pain.      Mobility  Bed Mobility Bed Mobility: Rolling Right;Rolling Left;Scooting to HOB Rolling Right: 1: +2 Total assist Rolling Right: Patient Percentage: 0% Rolling Left: 1: +2 Total assist Rolling Left: Patient Percentage: 0% Scooting to HOB: 1: +2 Total assist Scooting to [facility]: Patient Percentage: 0% Details for Bed Mobility Assistance: Pt not assisting due to impaired cognition.    Exercises     PT Diagnosis: Difficulty walking;Generalized weakness  PT Problem List: Decreased strength;Decreased activity tolerance;Decreased mobility;Decreased cognition;Decreased knowledge of use of DME;Decreased knowledge of precautions PT Treatment Interventions: DME instruction;Gait training;Functional mobility training;Therapeutic activities;Therapeutic exercise;Patient/family education;Balance  training     PT Goals(Current goals can be found in the care plan section) Acute Rehab PT Goals Patient Stated Goal: Pt unable to state. PT Goal Formulation: Patient unable to participate in goal setting Time For Goal Achievement: 11/30/12 Potential to Achieve Goals: Fair  Visit Information  Last PT Received On: 11/16/12 Assistance Needed: +2 History of Present Illness: Pt adm with acute renal failure after being found down at home.  Pt also with chronic leg ulcers and now with possible ostoemyelitis.       Prior Functioning  Home Living Family/patient expects to be discharged to:: Skilled nursing facility Prior Function Comments: Per chart pt uses a scooter    Cognition  Cognition Arousal/Alertness: Lethargic Overall Cognitive Status: Difficult to assess Area of Impairment: Following commands Difficult to assess due to: Level of arousal    Extremity/Trunk Assessment Lower Extremity Assessment Lower Extremity Assessment: Difficult to assess due to impaired cognition   Balance    End of Session PT - End of Session Activity Tolerance: Patient limited by lethargy Patient left: in bed;with call bell/phone within reach;with bed alarm set  GP     [facility] 11/16/2012, 2:53 PM  [facility] [place]

## 2012-11-16 NOTE — Clinical Social Work Psychosocial (Signed)
Clinical Social Work Department BRIEF PSYCHOSOCIAL ASSESSMENT 11/16/2012  Patient:  [name] [name], [name] [name]     Account Number:  [id number]     Admit date:  11/15/2012  Clinical Social Worker:  Lovey Newcomer  Date/Time:  11/16/2012 03:04 PM  Referred by:  Care Management  Date Referred:  11/16/2012 Referred for  SNF Placement   Other Referral:   Interview type:  Other - See comment Other interview type:   Patient is confused and disoriented. CSW contacted emergency contact [name] [name] who is the patient's nephew [phone]) and conducted assessment over phone.    PSYCHOSOCIAL DATA Living Status:  ALONE Admitted from facility:   Level of care:   Primary support name:  [name] [name] [facility]) [phone] Primary support relationship to patient:  FAMILY Degree of support available:   Patient has two surviving family members that try to look after him. After speaking with nephew CSW has determined that patient's support system is poor as the nephew hasn't seen patient in a few months.    CURRENT CONCERNS Current Concerns  Post-Acute Placement   Other Concerns:    SOCIAL WORK ASSESSMENT / PLAN CSW attempted to discuss SNF search process with patient but patient was confused and disoriented. CSW discussed patient's behavior with nurse who stated that patient has been like this. CSW contacted the patient's emergency contact nephew [name] [name] [phone] via phone. Nephew stated he has not seen th patient in a few months. Nephew stated that patient was admitted to hospital a couple of months ago and was discharged to a rehab facility in [facility] but could not recall the name of the facility. Nephew stated that patient was there for a few days and then went to a boarding house. Nephew was not sure if patient left rehab facility on his own accord, was "kicked" out, or was discharged after treatment was completed. Newphew stated that the patient was receiving home  health services in home prior to his last hospital admission, but is not aware of any home health services at the boarding house the patient came from. CSW attempted to contact patient's neice [name] [name] [phone] for more information, but CSW unable to reach her. Nephew stated that the patient uses his scooter chair to get around sometimes but also uses a cane to walk. CSW explained the SNF placement process to patient's nephew and the patient's nephew agreed that CSW should go forward with finding a bed for the patient. CSW informed nephew that CSW would leave SNF list in patient's room for his review. Nephew stated that he plans to visit patient on 11/17/12.   Assessment/plan status:  Psychosocial Support/Ongoing Assessment of Needs Other assessment/ plan:   CSW will complete FL2 and PASRR   Information/referral to community resources:   SNF list left in patient's room for nephew's review.    PATIENTS/FAMILYS RESPONSE TO PLAN OF CARE: Patient's nephew was kind and receptive to conducting a SNF bed search for patient. Nephew plans to come to the hospital on 11/16/12 and would like to be contacted with any further developments and questions.     Liz Beach, [place], [place], [id number]

## 2012-11-16 NOTE — Progress Notes (Signed)
PATIENT DETAILS Name: [name] [name] Age: 79 y.o. Sex: male Date of Birth: 08/27/1926 Admit Date: 11/15/2012 Admitting Physician Rise Patience, MD PCP:No PCP Per Patient  Subjective: No major events overnight-admitted with a mechanical fall. Patient complains of pain in his lower legs.  Assessment/Plan: Principal Problem:   Acute renal failure - Gently hydrate, creatinine now down trending. - Continue to hold Lasix and losartan for now.  Active Problems: Fall - Suspect a mechanical fall-rather than syncope-patient denies losing consciousness - Given the fact that he is on diuretics and antihypertensives,? Orthostatics - Hydrate, and recheck orthostatics. PT eval. - CT head negative on admission for acute abnormalities.  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 MRI was done last month did not show osteomyelitis. Continue with empiric vancomycin and Zosyn. I have consulted the [facility]  Hypertension - Cozaar and Lasix on hold given mild renal failure. - BP currently controlled with hydralazine. - As needed hydralazine  Diabetes - CBGs stable - Maintain on SSI - Hold Amaryl  Chronic diastolic heart failure - Currently clinically compensated, monitor closely as on IV fluids.  Disposition: Remain inpatient  DVT Prophylaxis: Prophylactic Lovenox   Code Status: Full code   Family Communication None at bedside  Procedures:  None  CONSULTS:  orthopedic surgery   MEDICATIONS: Scheduled Meds: . allopurinol  100 mg Oral Daily  . Chlorhexidine Gluconate Cloth  6 each Topical Q0600  . glimepiride  2 mg Oral QAC breakfast  . insulin aspart  0-9 Units Subcutaneous TID WC  . mupirocin ointment  1 application Nasal BID  . piperacillin-tazobactam (ZOSYN)  IV  3.375 g Intravenous Q8H  . sodium chloride  3 mL  Intravenous Q12H  . vancomycin  1,500 mg Intravenous Q24H  . verapamil  240 mg Oral Daily   Continuous Infusions: . sodium chloride 75 mL/hr at 11/15/12 2245   PRN Meds:.acetaminophen, acetaminophen, hydrALAZINE, ketoconazole, morphine injection, ondansetron (ZOFRAN) IV, ondansetron  Antibiotics: Anti-infectives   Start     Dose/Rate Route Frequency Ordered Stop   11/16/12 2200  vancomycin (VANCOCIN) 1,500 mg in sodium chloride 0.9 % 500 mL IVPB     1,500 mg 250 mL/hr over 120 Minutes Intravenous Every 24 hours 11/15/12 2020     11/16/12 0300  piperacillin-tazobactam (ZOSYN) IVPB 3.375 g     3.375 g 12.5 mL/hr over 240 Minutes Intravenous Every 8 hours 11/15/12 2020     11/15/12 2030  vancomycin (VANCOCIN) 2,000 mg in sodium chloride 0.9 % 500 mL IVPB     2,000 mg 250 mL/hr over 120 Minutes Intravenous To Emergency Dept 11/15/12 2020 11/16/12 0031   11/15/12 2030  piperacillin-tazobactam (ZOSYN) IVPB 3.375 g     3.375 g 100 mL/hr over 30 Minutes Intravenous  Once 11/15/12 2020 11/15/12 2118       PHYSICAL EXAM: Vital signs in last 24 hours: Filed Vitals:   11/15/12 2045 11/15/12 2304 11/16/12 0509 11/16/12 0512  BP:  144/72 122/69 109/57  Pulse: 97 97 85   Temp:  98.2 F (36.8 C) 98.3 F (36.8 C)   TempSrc:  Oral Oral   Resp: 25 18 18    Height:  5\' 10"  (1.778 m)    Weight:  102.059 kg (225 lb)    SpO2: 100% 100% 97%     Weight change:  Filed Weights   11/15/12 2304  Weight: 102.059 kg (225 lb)  Body mass index is 32.28 kg/(m^2).   Gen Exam: Awake and alert with clear speech.   Neck: Supple, No JVD.   Chest: B/L Clear.   CVS: S1 S2 Regular, no murmurs.  Abdomen: soft, BS +, non tender, non distended.  Extremities: no edema, Left foot dorsum has large ulcer around 5 cm in diameter. Patient also has sacral decubitus ulcers. Right leg has chronic skin changes with fluid. Neurologic: Non Focal.   Skin: No Rash.  Wounds: N/A.    Intake/Output from previous  day:  Intake/Output Summary (Last 24 hours) at 11/16/12 1038 Last data filed at 11/16/12 0630  Gross per 24 hour  Intake 1131.25 ml  Output    400 ml  Net 731.25 ml     LAB RESULTS: CBC  Recent Labs Lab 11/15/12 1846 11/16/12 0515  WBC 7.5 9.5  HGB 11.4* 10.5*  HCT 33.1* 31.4*  PLT 335 334  MCV 88.7 88.7  MCH 30.6 29.7  MCHC 34.4 33.4  RDW 14.5 14.5  LYMPHSABS  --  1.0  MONOABS  --  1.1*  EOSABS  --  0.1  BASOSABS  --  0.0    Chemistries   Recent Labs Lab 11/15/12 1846 11/16/12 0515  NA 133* 137  K 4.4 3.9  CL 96 100  CO2 25 24  GLUCOSE 89 96  BUN 36* 30*  CREATININE 1.70* 1.57*  CALCIUM 9.4 9.1    CBG:  Recent Labs Lab 11/15/12 2335 11/16/12 0919  GLUCAP 89 83    GFR Estimated Creatinine Clearance: 41.9 ml/min (by C-G formula based on Cr of 1.57).  Coagulation profile No results found for this basename: INR, PROTIME,  in the last 168 hours  Cardiac Enzymes  Recent Labs Lab 11/15/12 1846 11/15/12 2044  CKMB 5.9*  --   TROPONINI  --  <0.30    No components found with this basename: POCBNP,   Recent Labs  11/16/12 0515  DDIMER 3.39*   No results found for this basename: HGBA1C,  in the last 72 hours No results found for this basename: CHOL, HDL, LDLCALC, TRIG, CHOLHDL, LDLDIRECT,  in the last 72 hours No results found for this basename: TSH, T4TOTAL, FREET3, T3FREE, THYROIDAB,  in the last 72 hours No results found for this basename: VITAMINB12, FOLATE, FERRITIN, TIBC, IRON, RETICCTPCT,  in the last 72 hours No results found for this basename: LIPASE, AMYLASE,  in the last 72 hours  Urine Studies No results found for this basename: UACOL, UAPR, USPG, UPH, UTP, UGL, UKET, UBIL, UHGB, UNIT, UROB, ULEU, UEPI, UWBC, URBC, UBAC, CAST, CRYS, UCOM, BILUA,  in the last 72 hours  MICROBIOLOGY: Recent Results (from the past 240 hour(s))  WOUND CULTURE     Status: None   Collection Time    11/15/12 11:21 AM      Result Value Range  Status   Specimen Description WOUND LEFT LEG   Final   Special Requests NONE   Final   Gram Stain     Final   Value: RARE WBC PRESENT, PREDOMINANTLY PMN     NO SQUAMOUS EPITHELIAL CELLS SEEN     MODERATE GRAM NEGATIVE RODS     FEW GRAM POSITIVE COCCI     IN PAIRS   Culture PENDING   Incomplete   Report Status PENDING   Incomplete  MRSA PCR SCREENING     Status: Abnormal   Collection Time    11/15/12 10:21 PM      Result Value Range  Status   MRSA by PCR POSITIVE (*) NEGATIVE Final   Comment:            The GeneXpert MRSA Assay (FDA     approved for NASAL specimens     only), is one component of a     comprehensive MRSA colonization     surveillance program. It is not     intended to diagnose MRSA     infection nor to guide or     monitor treatment for     MRSA infections.     RESULT CALLED TO, READ BACK BY AND VERIFIED WITH:     RN W.JACKSON BY L.PITT AT 0056 11/16/12    RADIOLOGY STUDIES/RESULTS: Dg Pelvis 1-2 Views  11/15/2012   *RADIOLOGY REPORT*  Clinical Data: Fall.  Bilateral lower extremity pain.  Left hip pain.  PELVIS - 1-2 VIEW  Comparison: None available.  Findings: A lucency over the left superior.  The ramus is related to overlying soft tissues.  No acute fracture is present. Degenerative changes are noted in the lower lumbar spine and bilateral SI joint.  Mild degenerative changes are present in the hips.  Densities within the anatomic pelvis likely represent phleboliths.  IMPRESSION:  1.  No acute abnormality. 2.  Degenerative changes in the lumbar spine, SI joints, and hips.   Original Report Authenticated By: San Morelle, M.D.   Dg Tibia/fibula Left  11/15/2012   *RADIOLOGY REPORT*  Clinical Data: Fall.  Left-sided pain.  Question osteomyelitis.  LEFT TIBIA AND FIBULA - 2 VIEW  Comparison: Left tibia and fibula radiographs 08/05/2012.  Findings: Degenerative changes are again noted at the knee and ankle.  Atherosclerotic changes are present.  Edematous  changes in the lower extremity decreased.  No acute osseous abnormality is present.  IMPRESSION:  1.  Decreasing edematous changes in the lower extremity. 2.  Degenerative changes of the knee and ankle. 3.  Atherosclerosis.   Original Report Authenticated By: San Morelle, M.D.   Dg Tibia/fibula Right  11/15/2012   *RADIOLOGY REPORT*  Clinical Data: Fall.  Pain in both legs.  RIGHT TIBIA AND FIBULA - 2 VIEW  Comparison: Right tibia and fibula films 08/05/2012.  Findings: Chronic degenerative changes are present at the knee and ankle joints.  Edematous changes are present anterior to the tibia. Edema is reduced compared to the prior study.  Vascular calcifications are present.  No acute abnormality is evident.  IMPRESSION:  1.  No acute osseous abnormality. 2.  Decreased edema. 3.  Atherosclerosis. 4.  Degenerative changes at the knee and ankle.   Original Report Authenticated By: San Morelle, M.D.   Ct Head Wo Contrast  11/15/2012   *RADIOLOGY REPORT*  Clinical Data: Fall  CT HEAD WITHOUT CONTRAST  Technique:  Contiguous axial images were obtained from the base of the skull through the vertex without contrast.  Comparison: Prior CT from 03/15/2006  Findings: Diffuse prominence of the CSF containing spaces is again seen, consistent with generalized atrophy. Scattered and confluent hypodensity within the periventricular white matter is consistent with chronic small vessel ischemic changes.  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  No acute intracranial hemorrhage or infarct.  No extra-axial fluid collection.  The calvarium is intact.  Paranasal sinuses and mastoid air cells are clear.  IMPRESSION: 1.  Atrophy with chronic small vessel ischemic changes.  No acute intracranial hemorrhage or infarct. 2.  Unchanged remote right basal ganglia and periventricular white matter  lacunar infarcts.   Original Report Authenticated By: Jeannine Boga, M.D.   Mr Foot Left Wo Contrast  10/17/2012   *RADIOLOGY REPORT*  Clinical Data: Diabetic ulcer on the dorsum of the foot.  MRI OF THE LEFT FOREFOOT WITHOUT CONTRAST  Technique:  Multiplanar, multisequence MR imaging was performed. No intravenous contrast was administered.  Comparison: Radiographs dated 10/15/2012  Findings: There is a superficial 3.1 x 2.6 x 0.6 cm ulceration on the dorsal lateral aspect of the distal forefoot overlying the distal third fourth and fifth metatarsals.  There is no abscess in the foot.  There is no osteomyelitis or septic joint.  There is slight arthritis of the first metatarsal phalangeal joint.  The flexor and extensor tendons appear normal.  There is edema in the soft tissues of the dorsum of the foot could represent cellulitis.  IMPRESSION:  1.  Soft tissue ulceration and adjacent edema consistent with cellulitis on the dorsum of the foot. 2.  No deep extension into the muscles, tendons, or bones. 3.  No abscesses.   Original Report Authenticated By: Lorriane Shire, M.D.   Dg Foot Complete Left  11/15/2012   *RADIOLOGY REPORT*  Clinical Data: Osteomyelitis.  LEFT FOOT - COMPLETE 3+ VIEW  Comparison: Left foot radiographs 10/15/2012.  MRI of the left foot 10/17/2012.  Findings: Progressive lucency is evident within the metatarsal head as, concerning for erosions and osteomyelitis.  Persistent dorsal soft tissue swelling is evident, compatible with cellulitis. Diffuse osteopenia is evident.  IMPRESSION:  1.  Progressive lucency in the metatarsal heads concerning for developing osteomyelitis. 2.  Persistent soft tissue swelling over the dorsum of the foot compatible with cellulitis.   Original Report Authenticated By: San Morelle, M.D.    Oren Binet, MD  [facility] Pager:[phone]  If 7PM-7AM, please contact night-coverage [email or web address] Password [email or web address] 11/16/2012, 10:38 AM   LOS: 1 day

## 2012-11-16 NOTE — Care Management Note (Signed)
    Page 1 of 1   11/19/2012     12:38:20 PM   CARE MANAGEMENT NOTE 11/19/2012  Patient:  [name] [name], [name] [name]   Account Number:  [id number]  Date Initiated:  11/16/2012  Documentation initiated by:  Tomi Bamberger  Subjective/Objective Assessment:   dx arf, chronic le ulcers and foot osteo.  admit- lives in boarding house.  Had [facility] with [facility].     Action/Plan:   Anticipated DC Date:  11/19/2012   Anticipated DC Plan:  SKILLED NURSING FACILITY  In-house referral  Clinical Social Worker      DC Planning Services  CM consult      Choice offered to / List presented to:             Status of service:  Completed, signed off Medicare Important Message given?   (If response is "NO", the following Medicare IM given date fields will be blank) Date Medicare IM given:   Date Additional Medicare IM given:    Discharge Disposition:  [place]  Per UR Regulation:  Reviewed for med. necessity/level of care/duration of stay  If discussed at [place] of Stay Meetings, dates discussed:    Comments:  11/19/12 12:36 Tomi Bamberger RN, BSN [phone] patient for dc to [facility] in [place] today, CSW following.  11/16/12 15:31 Tomi Bamberger RN, BSN [phone] patient lives in a boarding house, patient has a motorized scooter that he uses at home.  Patient was found down by [facility] with [place].  Per physical therapy recs SNF, CSW referral.

## 2012-11-16 NOTE — Progress Notes (Signed)
VASCULAR LAB PRELIMINARY  PRELIMINARY  PRELIMINARY  PRELIMINARY  Bilateral lower extremity venous duplex attempted.  Right completed.  Patient refused left leg.  Preliminary report:  Right:  No evidence of DVT, superficial thrombosis, or Baker's cyst.  Calf not imaged due to bandage.  Patient refused to let me do the left leg.  He never took the covers from over his head all the time I was in the room.  Yakima Kreitzer, RVT 11/16/2012, 4:09 PM

## 2012-11-16 NOTE — Progress Notes (Signed)
CRITICAL VALUE ALERT  Critical value received:  Positive Nasal MRSA swab  Date of notification: 7/11  Time of notification:  0100  Critical value read back:yes  Nurse who received alert:  Darreld Mclean, RN  MD notified (1st page):  Dr. Hal Hope   Time of first page:  0115  Responding MD:  Hal Hope  Time MD responded:  [place]  Patient already on contact isolation for history of MRSA, already on IV vanc and zosyn and positive MRSA swab order set initiated.

## 2012-11-16 NOTE — Progress Notes (Signed)
[name] [name] [id number] Admitted to [id number]: 11/15/2012 2230 Attending Provider: Jonetta Osgood, MD    [name] [name] is a 79 y.o. male patient admitted from ED awake, alert  & orientated  X 4,  Full Code, VSS - Blood pressure 144/72, pulse 97, temperature 98.2 F (36.8 C), temperature source Oral, resp. rate 18, height 5\' 10"  (1.778 m), weight 102.059 kg (225 lb), SpO2 100.00%.RA, no c/o shortness of breath, no c/o chest pain, no distress noted. Tele # [id number] placed and pt is currently running:normal sinus rhythm.   IV site WDL:  hand right, condition patent and no redness with a transparent dsg that's clean dry and intact.  Allergies:  No Known Allergies   Past Medical History  Diagnosis Date  . Hypertension   . Lymphadenitis 08/10/2012  . Gout   . Chronic kidney disease     RENAL INSUFICCIENCY  . Diabetes mellitus without complication     History:  obtained from chart review and the patient.  Pt placed on contact for history of MRSA. Orientation to unit, room and routine. Information packet given and safety video watched.  Admission INP armband ID verified with patient, and in place. SR up x 2, fall risk assessment complete with Patient verbalizing understanding of risks associated with falls. Pt verbalizes an understanding of how to use the call bell and to call for help before getting out of bed.  Fissure between buttocks barrier cream applied, stage 2 to coccyx 9x6 foam dressing applied, stage 2 to right buttock 6x4 foam dressing applied, ulcer to top of left foot 4.5x 3 moist to dry dressing applied. Also has dermatitis like areas to his left scapula and to the sides and back of his head which are dry and patchy left open to air. Also has Stasis dermatitis like skin from knee to toes bilaterally xeroform, kerlix and paper tape applied. Scattered scabs to bil arms and back.   Will cont to monitor and assist as needed.  Darreld Mclean [facility], RN 11/16/2012 1:46 AM

## 2012-11-16 NOTE — Consult Note (Signed)
WOC consult Note Reason for Consult:Consult requested for left foot wound and pressure ulcers.  Left foot with X-ray indicates possible osteomyelitis; this is beyond [facility] scope of practice. Full thickness wound to left anterior foot; 2.5X4X.3cm; 100% yellow; large amt yellow drainage, some strong odor. Please refer to ortho service for further plan of care.  Moist dressing applied until further input available from that team. Wound type: Sacrum with reported stage 2 wound; 9X6X.1cm area with dry scaly crusted skin surrounding partial thickness open area 2X2X.1cm in butterfly shaped pattern just above gluteal fold.   Appearance is atypical for stage 2 wound.  Pt states he had shingles "awhile ago" to this site. Right Buttock with same appearance 6X4X.1cm.  Both sites without odor or drainage. Upper back with 6X6 cm area of dry scaling plaque; white and peeling.  No odor or drainage. Entire posterior head with  dry scaling placque; white and peeling.  No odor or drainage. Pressure Ulcer POA: Do not feel that the buttocks and sacrum wounds are pressure ulcers. Pt could benefit from dermatology consult after discharge for obvious skin condition.  Ketoconazole cream has been ordered by primary team; this may help reduce symptoms to affected areas.  Foam dressing to protect sacrum and buttocks areas from further injury.   Bilat legs with generalized edema and chronic skin changes consistent with lymphademia; peu'de orange skin with multiple areas of cracking, partial thickness skin loss, and large amt yellow drainage with strong odor. Plan: Eucerin cream to provide moisture and assist with removal of nonviable skin.  Wrap with abd pads and kerlex to absorb drainage and promote healing. Please re-consult if further assistance is needed.  Thank-you,  Julien Girt MSN, [place], [place], [place], [place]

## 2012-11-17 ENCOUNTER — Inpatient Hospital Stay (HOSPITAL_COMMUNITY): Payer: Medicare Other

## 2012-11-17 ENCOUNTER — Encounter (HOSPITAL_COMMUNITY): Payer: Self-pay | Admitting: *Deleted

## 2012-11-17 DIAGNOSIS — L97909 Non-pressure chronic ulcer of unspecified part of unspecified lower leg with unspecified severity: Secondary | ICD-10-CM

## 2012-11-17 LAB — CBC
Hemoglobin: 9.6 g/dL — ABNORMAL LOW (ref 13.0–17.0)
MCHC: 33.2 g/dL (ref 30.0–36.0)
RDW: 14.8 % (ref 11.5–15.5)
WBC: 8.2 10*3/uL (ref 4.0–10.5)

## 2012-11-17 LAB — BASIC METABOLIC PANEL
Chloride: 103 mEq/L (ref 96–112)
GFR calc Af Amer: 51 mL/min — ABNORMAL LOW (ref >90)
GFR calc non Af Amer: 44 mL/min — ABNORMAL LOW (ref >90)
Potassium: 3.5 mEq/L (ref 3.5–5.1)
Sodium: 138 mEq/L (ref 135–145)

## 2012-11-17 LAB — GLUCOSE, CAPILLARY
Glucose-Capillary: 119 mg/dL — ABNORMAL HIGH (ref 70–99)
Glucose-Capillary: 110 mg/dL — ABNORMAL HIGH (ref 70–99)

## 2012-11-17 MED ORDER — GADOBENATE DIMEGLUMINE 529 MG/ML IV SOLN
20.0000 mL | Freq: Once | INTRAVENOUS | Status: AC
  Administered 2012-11-17: 20 mL via INTRAVENOUS

## 2012-11-17 MED ORDER — POTASSIUM CHLORIDE 20 MEQ/15ML (10%) PO LIQD
40.0000 meq | Freq: Every day | ORAL | Status: DC
  Administered 2012-11-17 – 2012-11-19 (×3): 40 meq via ORAL
  Filled 2012-11-17 (×3): qty 30

## 2012-11-17 MED ORDER — POTASSIUM CHLORIDE CRYS ER 20 MEQ PO TBCR
40.0000 meq | EXTENDED_RELEASE_TABLET | Freq: Once | ORAL | Status: DC
  Filled 2012-11-17: qty 2

## 2012-11-17 NOTE — Progress Notes (Signed)
Patients blood sugar is 73. The pt did not eat lunch or dinner. On call MD notified. Order for D 5 1/2 NS given. Will continue to monitor.

## 2012-11-17 NOTE — Progress Notes (Signed)
PATIENT DETAILS Name: [name] [name] Age: 79 y.o. Sex: male Date of Birth: [birth year] Admit Date: 11/15/2012 Admitting Physician Rise Patience, MD PCP:No PCP Per Patient  Subjective: No complaints overnight, seen by orthopedics. Per orthopedics needs MRI to evaluate to see if any surgical intervention  Assessment/Plan:  Acute renal failure - Gently hydrate, creatinine is improving. - Continue to hold Lasix and losartan for now.  Fall - Suspect a mechanical fall-rather than syncope-patient denies losing consciousness - Given the fact that he is on diuretics and antihypertensives,? Orthostatics - Hydrate, and recheck orthostatics. PT eval. - CT head negative on admission for acute abnormalities.  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 -Patient reportedly did not tolerate MRI, we'll try again today. -Continue with empiric vancomycin and Zosyn. Appreciate orthopedics to help  Hypertension - Cozaar and Lasix on hold given mild renal failure. - BP currently controlled with hydralazine. - As needed hydralazine  Diabetes - CBGs stable - Maintain on SSI - Hold Amaryl  Chronic diastolic heart failure - Currently clinically compensated, monitor closely as on IV fluids.  Disposition: Remain inpatient  DVT Prophylaxis: Prophylactic Lovenox   Code Status: Full code   Family Communication None at bedside  Procedures:  None  CONSULTS:  orthopedic surgery   MEDICATIONS: Scheduled Meds: . allopurinol  100 mg Oral Daily  . Chlorhexidine Gluconate Cloth  6 each Topical Q0600  . enoxaparin (LOVENOX) injection  40 mg Subcutaneous Q24H  . hydrocerin   Topical BID  . insulin aspart  0-9 Units Subcutaneous TID WC  . mupirocin ointment  1 application Nasal BID  . piperacillin-tazobactam (ZOSYN)  IV  3.375 g Intravenous Q8H  . sodium chloride  3 mL Intravenous Q12H  . vancomycin  1,500 mg  Intravenous Q24H  . verapamil  240 mg Oral Daily   Continuous Infusions: . dextrose 5 % and 0.45% NaCl 75 mL/hr at 11/17/12 1216   PRN Meds:.acetaminophen, acetaminophen, hydrALAZINE, ketoconazole, morphine injection, ondansetron (ZOFRAN) IV, ondansetron  Antibiotics: Anti-infectives   Start     Dose/Rate Route Frequency Ordered Stop   11/16/12 2200  vancomycin (VANCOCIN) 1,500 mg in sodium chloride 0.9 % 500 mL IVPB     1,500 mg 250 mL/hr over 120 Minutes Intravenous Every 24 hours 11/15/12 2020     11/16/12 0300  piperacillin-tazobactam (ZOSYN) IVPB 3.375 g     3.375 g 12.5 mL/hr over 240 Minutes Intravenous Every 8 hours 11/15/12 2020     11/15/12 2030  vancomycin (VANCOCIN) 2,000 mg in sodium chloride 0.9 % 500 mL IVPB     2,000 mg 250 mL/hr over 120 Minutes Intravenous To Emergency Dept 11/15/12 2020 11/16/12 0031   11/15/12 2030  piperacillin-tazobactam (ZOSYN) IVPB 3.375 g     3.375 g 100 mL/hr over 30 Minutes Intravenous  Once 11/15/12 2020 11/15/12 2118       PHYSICAL EXAM: Vital signs in last 24 hours: Filed Vitals:   11/16/12 0512 11/16/12 1514 11/16/12 2200 11/17/12 0541  BP: 109/57 115/62 105/61 114/68  Pulse:  82 71 68  Temp:  98.4 F (36.9 C) 98.6 F (37 C) 98.4 F (36.9 C)  TempSrc:  Oral Oral Oral  Resp:  16 18 20   Height:      Weight:      SpO2:  98% 100% 98%    Weight change:  Filed Weights   11/15/12 2304  Weight: 102.059 kg (225 lb)   Body mass index  is 32.28 kg/(m^2).   Gen Exam: Awake and alert with clear speech.   Neck: Supple, No JVD.   Chest: B/L Clear.   CVS: S1 S2 Regular, no murmurs.  Abdomen: soft, BS +, non tender, non distended.  Extremities: no edema, Left foot dorsum has large ulcer around 5 cm in diameter. Patient also has sacral decubitus ulcers. Right leg has chronic skin changes with fluid. Neurologic: Non Focal.   Skin: No Rash.  Wounds: N/A.    Intake/Output from previous day:  Intake/Output Summary (Last 24 hours)  at 11/17/12 1225 Last data filed at 11/17/12 0545  Gross per 24 hour  Intake   1075 ml  Output    100 ml  Net    975 ml     LAB RESULTS: CBC  Recent Labs Lab 11/15/12 1846 11/16/12 0515 11/17/12 0550  WBC 7.5 9.5 8.2  HGB 11.4* 10.5* 9.6*  HCT 33.1* 31.4* 28.9*  PLT 335 334 324  MCV 88.7 88.7 88.9  MCH 30.6 29.7 29.5  MCHC 34.4 33.4 33.2  RDW 14.5 14.5 14.8  LYMPHSABS  --  1.0  --   MONOABS  --  1.1*  --   EOSABS  --  0.1  --   BASOSABS  --  0.0  --     Chemistries   Recent Labs Lab 11/15/12 1846 11/16/12 0515 11/17/12 0550  NA 133* 137 138  K 4.4 3.9 3.5  CL 96 100 103  CO2 25 24 28   GLUCOSE 89 96 79  BUN 36* 30* 19  CREATININE 1.70* 1.57* 1.41*  CALCIUM 9.4 9.1 8.3*    CBG:  Recent Labs Lab 11/16/12 1207 11/16/12 1721 11/16/12 1753 11/16/12 2104 11/17/12 0754  GLUCAP 131* 64* 108* 73 87    GFR Estimated Creatinine Clearance: 46.7 ml/min (by C-G formula based on Cr of 1.41).  Coagulation profile No results found for this basename: INR, PROTIME,  in the last 168 hours  Cardiac Enzymes  Recent Labs Lab 11/15/12 1846 11/15/12 2044  CKMB 5.9*  --   TROPONINI  --  <0.30    No components found with this basename: POCBNP,   Recent Labs  11/16/12 0515  DDIMER 3.39*   No results found for this basename: HGBA1C,  in the last 72 hours No results found for this basename: CHOL, HDL, LDLCALC, TRIG, CHOLHDL, LDLDIRECT,  in the last 72 hours No results found for this basename: TSH, T4TOTAL, FREET3, T3FREE, THYROIDAB,  in the last 72 hours No results found for this basename: VITAMINB12, FOLATE, FERRITIN, TIBC, IRON, RETICCTPCT,  in the last 72 hours No results found for this basename: LIPASE, AMYLASE,  in the last 72 hours  Urine Studies No results found for this basename: UACOL, UAPR, USPG, UPH, UTP, UGL, UKET, UBIL, UHGB, UNIT, UROB, ULEU, UEPI, UWBC, URBC, UBAC, CAST, CRYS, UCOM, BILUA,  in the last 72 hours  MICROBIOLOGY: Recent Results  (from the past 240 hour(s))  WOUND CULTURE     Status: None   Collection Time    11/15/12 11:21 AM      Result Value Range Status   Specimen Description WOUND LEFT LEG   Final   Special Requests NONE   Final   Gram Stain     Final   Value: RARE WBC PRESENT, PREDOMINANTLY PMN     NO SQUAMOUS EPITHELIAL CELLS SEEN     MODERATE GRAM NEGATIVE RODS     FEW GRAM POSITIVE COCCI     IN PAIRS  Culture Culture reincubated for better growth   Final   Report Status PENDING   Incomplete  MRSA PCR SCREENING     Status: Abnormal   Collection Time    11/15/12 10:21 PM      Result Value Range Status   MRSA by PCR POSITIVE (*) NEGATIVE Final   Comment:            The GeneXpert MRSA Assay (FDA     approved for NASAL specimens     only), is one component of a     comprehensive MRSA colonization     surveillance program. It is not     intended to diagnose MRSA     infection nor to guide or     monitor treatment for     MRSA infections.     RESULT CALLED TO, READ BACK BY AND VERIFIED WITH:     RN W.JACKSON BY L.PITT AT 0056 11/16/12    RADIOLOGY STUDIES/RESULTS: Dg Pelvis 1-2 Views  11/15/2012   *RADIOLOGY REPORT*  Clinical Data: Fall.  Bilateral lower extremity pain.  Left hip pain.  PELVIS - 1-2 VIEW  Comparison: None available.  Findings: A lucency over the left superior.  The ramus is related to overlying soft tissues.  No acute fracture is present. Degenerative changes are noted in the lower lumbar spine and bilateral SI joint.  Mild degenerative changes are present in the hips.  Densities within the anatomic pelvis likely represent phleboliths.  IMPRESSION:  1.  No acute abnormality. 2.  Degenerative changes in the lumbar spine, SI joints, and hips.   Original Report Authenticated By: San Morelle, M.D.   Dg Tibia/fibula Left  11/15/2012   *RADIOLOGY REPORT*  Clinical Data: Fall.  Left-sided pain.  Question osteomyelitis.  LEFT TIBIA AND FIBULA - 2 VIEW  Comparison: Left tibia and  fibula radiographs 08/05/2012.  Findings: Degenerative changes are again noted at the knee and ankle.  Atherosclerotic changes are present.  Edematous changes in the lower extremity decreased.  No acute osseous abnormality is present.  IMPRESSION:  1.  Decreasing edematous changes in the lower extremity. 2.  Degenerative changes of the knee and ankle. 3.  Atherosclerosis.   Original Report Authenticated By: San Morelle, M.D.   Dg Tibia/fibula Right  11/15/2012   *RADIOLOGY REPORT*  Clinical Data: Fall.  Pain in both legs.  RIGHT TIBIA AND FIBULA - 2 VIEW  Comparison: Right tibia and fibula films 08/05/2012.  Findings: Chronic degenerative changes are present at the knee and ankle joints.  Edematous changes are present anterior to the tibia. Edema is reduced compared to the prior study.  Vascular calcifications are present.  No acute abnormality is evident.  IMPRESSION:  1.  No acute osseous abnormality. 2.  Decreased edema. 3.  Atherosclerosis. 4.  Degenerative changes at the knee and ankle.   Original Report Authenticated By: San Morelle, M.D.   Ct Head Wo Contrast  11/15/2012   *RADIOLOGY REPORT*  Clinical Data: Fall  CT HEAD WITHOUT CONTRAST  Technique:  Contiguous axial images were obtained from the base of the skull through the vertex without contrast.  Comparison: Prior CT from 03/15/2006  Findings: Diffuse prominence of the CSF containing spaces is again seen, consistent with generalized atrophy. Scattered and confluent hypodensity within the periventricular white matter is consistent with chronic small vessel ischemic changes.  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  No acute intracranial hemorrhage or infarct.  No extra-axial  fluid collection.  The calvarium is intact.  Paranasal sinuses and mastoid air cells are clear.  IMPRESSION: 1.  Atrophy with chronic small vessel ischemic changes.  No acute intracranial hemorrhage  or infarct. 2.  Unchanged remote right basal ganglia and periventricular white matter lacunar infarcts.   Original Report Authenticated By: Jeannine Boga, M.D.   Mr Foot Left Wo Contrast  10/17/2012   *RADIOLOGY REPORT*  Clinical Data: Diabetic ulcer on the dorsum of the foot.  MRI OF THE LEFT FOREFOOT WITHOUT CONTRAST  Technique:  Multiplanar, multisequence MR imaging was performed. No intravenous contrast was administered.  Comparison: Radiographs dated 10/15/2012  Findings: There is a superficial 3.1 x 2.6 x 0.6 cm ulceration on the dorsal lateral aspect of the distal forefoot overlying the distal third fourth and fifth metatarsals.  There is no abscess in the foot.  There is no osteomyelitis or septic joint.  There is slight arthritis of the first metatarsal phalangeal joint.  The flexor and extensor tendons appear normal.  There is edema in the soft tissues of the dorsum of the foot could represent cellulitis.  IMPRESSION:  1.  Soft tissue ulceration and adjacent edema consistent with cellulitis on the dorsum of the foot. 2.  No deep extension into the muscles, tendons, or bones. 3.  No abscesses.   Original Report Authenticated By: Lorriane Shire, M.D.   Dg Foot Complete Left  11/15/2012   *RADIOLOGY REPORT*  Clinical Data: Osteomyelitis.  LEFT FOOT - COMPLETE 3+ VIEW  Comparison: Left foot radiographs 10/15/2012.  MRI of the left foot 10/17/2012.  Findings: Progressive lucency is evident within the metatarsal head as, concerning for erosions and osteomyelitis.  Persistent dorsal soft tissue swelling is evident, compatible with cellulitis. Diffuse osteopenia is evident.  IMPRESSION:  1.  Progressive lucency in the metatarsal heads concerning for developing osteomyelitis. 2.  Persistent soft tissue swelling over the dorsum of the foot compatible with cellulitis.   Original Report Authenticated By: San Morelle, M.D.    Birdie Hopes, MD  [facility] Pager:[phone]  If 7PM-7AM, please contact night-coverage [email or web address] Password [facility] 11/17/2012, 12:25 PM   LOS: 2 days

## 2012-11-18 LAB — GLUCOSE, CAPILLARY
Glucose-Capillary: 114 mg/dL — ABNORMAL HIGH (ref 70–99)
Glucose-Capillary: 131 mg/dL — ABNORMAL HIGH (ref 70–99)

## 2012-11-18 LAB — BASIC METABOLIC PANEL
Calcium: 8.4 mg/dL (ref 8.4–10.5)
Creatinine, Ser: 1.21 mg/dL (ref 0.50–1.35)
GFR calc non Af Amer: 53 mL/min — ABNORMAL LOW (ref >90)
Glucose, Bld: 110 mg/dL — ABNORMAL HIGH (ref 70–99)
Sodium: 137 mEq/L (ref 135–145)

## 2012-11-18 LAB — CBC
MCH: 30.4 pg (ref 26.0–34.0)
Platelets: 343 10*3/uL (ref 150–400)
RBC: 3.32 MIL/uL — ABNORMAL LOW (ref 4.22–5.81)
RDW: 14.8 % (ref 11.5–15.5)

## 2012-11-18 NOTE — Progress Notes (Signed)
ANTIBIOTIC CONSULT NOTE - FOLLOW UP  Pharmacy Consult for vancomycin and Zosyn Indication: bilateral LE ulcers, cellulitis  No Known Allergies  Patient Measurements: Height: 5\' 10"  (177.8 cm) Weight: 225 lb (102.059 kg) IBW/kg (Calculated) : 73  Vital Signs: Temp: 98.7 F (37.1 C) (07/24 0546) Temp src: Oral (07/24 0546) BP: 124/65 mmHg (07/24 0546) Pulse Rate: 95 (07/24 0546) Intake/Output from previous day: 07/23 0701 - 07/24 0700 In: 425 [I.V.:375; IV Piggyback:50] Out: 1100 [Urine:1100] Intake/Output from this shift:    Labs:  Recent Labs  11/16/12 0515 11/17/12 0550 11/18/12 0520  WBC 9.5 8.2 6.9  HGB 10.5* 9.6* 10.1*  PLT 334 324 343  CREATININE 1.57* 1.41* 1.21   Estimated Creatinine Clearance: 54.4 ml/min (by C-G formula based on Cr of 1.21). No results found for this basename: VANCOTROUGH, Corlis Leak, VANCORANDOM, Nolan, GENTPEAK, GENTRANDOM, TOBRATROUGH, TOBRAPEAK, TOBRARND, AMIKACINPEAK, AMIKACINTROU, AMIKACIN,  in the last 72 hours   Microbiology: Recent Results (from the past 720 hour(s))  WOUND CULTURE     Status: None   Collection Time    11/15/12 11:21 AM      Result Value Range Status   Specimen Description WOUND LEFT LEG   Final   Special Requests NONE   Final   Gram Stain     Final   Value: RARE WBC PRESENT, PREDOMINANTLY PMN     NO SQUAMOUS EPITHELIAL CELLS SEEN     MODERATE GRAM NEGATIVE RODS     FEW GRAM POSITIVE COCCI     IN PAIRS   Culture Culture reincubated for better growth   Final   Report Status PENDING   Incomplete  MRSA PCR SCREENING     Status: Abnormal   Collection Time    11/15/12 10:21 PM      Result Value Range Status   MRSA by PCR POSITIVE (*) NEGATIVE Final   Comment:            The GeneXpert MRSA Assay (FDA     approved for NASAL specimens     only), is one component of a     comprehensive MRSA colonization     surveillance program. It is not     intended to diagnose MRSA     infection nor to guide or   monitor treatment for     MRSA infections.     RESULT CALLED TO, READ BACK BY AND VERIFIED WITH:     RN W.JACKSON BY L.PITT AT 0056 11/16/12    Anti-infectives   Start     Dose/Rate Route Frequency Ordered Stop   11/16/12 2200  vancomycin (VANCOCIN) 1,500 mg in sodium chloride 0.9 % 500 mL IVPB     1,500 mg 250 mL/hr over 120 Minutes Intravenous Every 24 hours 11/15/12 2020     11/16/12 0300  piperacillin-tazobactam (ZOSYN) IVPB 3.375 g     3.375 g 12.5 mL/hr over 240 Minutes Intravenous Every 8 hours 11/15/12 2020     11/15/12 2030  vancomycin (VANCOCIN) 2,000 mg in sodium chloride 0.9 % 500 mL IVPB     2,000 mg 250 mL/hr over 120 Minutes Intravenous To Emergency Dept 11/15/12 2020 11/16/12 0031   11/15/12 2030  piperacillin-tazobactam (ZOSYN) IVPB 3.375 g     3.375 g 100 mL/hr over 30 Minutes Intravenous  Once 11/15/12 2020 11/15/12 2118      Assessment: 79 y/o male on day 4 of vancomycin and Zosyn for B/L LE ulcers/cellulitis. MRI negative for abscess or osteomyelitis. Renal function has improved since  admit so will check a steady-state level tonight. He is afebrile and WBC are wnl.  Vanc 7/21>> Zosyn 7/21>>  7/21 wound cx (L leg) - mod GNR, few GPC pairs 7/21 mrsa pcr +  Goal of Therapy:  Vancomycin trough level 10-15 mcg/ml  Plan:  -Continue Zosyn 3.375 g IV q8h to be infused over 4 hours -Continue vancomycin 1500 mg IV q24h - trough prior to tonight's dose -Monitor renal function and clinical progress  [facility], Pharm.D., BCPS Clinical Pharmacist Pager: [phone] 11/18/2012 10:39 AM

## 2012-11-18 NOTE — Progress Notes (Signed)
PATIENT DETAILS Name: [name] [name] Age: 79 y.o. Sex: male Date of Birth: 19-Jun-1926 Admit Date: 11/15/2012 Admitting Physician Rise Patience, MD PCP:No PCP Per Patient  Subjective: No complaints overnight.  Assessment/Plan:  Acute renal failure -Gently hydrate, creatinine is improving. -Continue to hold Lasix and losartan for now. -Creatinine improved and back to normal, I will discontinue IV fluids as patient has history of diastolic CHF.  Fall - Suspect a mechanical fall-rather than syncope-patient denies losing consciousness - Given the fact that he is on diuretics and antihypertensives,? Orthostatics - Hydrate, and recheck orthostatics. PT eval. - CT head negative on admission for acute abnormalities.  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 -Patient reportedly did not tolerate MRI, we'll try again today. -Continue with empiric vancomycin and Zosyn.  -Discussed with Dr. Marlou Sa of [facility], who recommended no surgical intervention. -Continue antibiotics for 10-14 days, patient followup with Dr. Sharol Given as outpatient.  Hypertension - Cozaar and Lasix on hold given mild renal failure. - BP currently controlled with hydralazine. - As needed hydralazine  Diabetes - CBGs stable - Maintain on SSI - Hold Amaryl  Chronic diastolic heart failure - Currently clinically compensated, monitor closely as on IV fluids.  Disposition: Remain inpatient, likely to be appropriate for discharge in a.m. To SNF  DVT Prophylaxis: Prophylactic Lovenox   Code Status: Full code   Family Communication None at bedside  Procedures:  None  CONSULTS:  orthopedic surgery   MEDICATIONS: Scheduled Meds: . allopurinol  100 mg Oral Daily  . Chlorhexidine Gluconate Cloth  6 each Topical Q0600  . enoxaparin (LOVENOX) injection  40 mg Subcutaneous Q24H  . hydrocerin   Topical BID  . insulin  aspart  0-9 Units Subcutaneous TID WC  . mupirocin ointment  1 application Nasal BID  . piperacillin-tazobactam (ZOSYN)  IV  3.375 g Intravenous Q8H  . potassium chloride  40 mEq Oral Daily  . sodium chloride  3 mL Intravenous Q12H  . vancomycin  1,500 mg Intravenous Q24H  . verapamil  240 mg Oral Daily   Continuous Infusions: . dextrose 5 % and 0.45% NaCl 75 mL/hr at 11/17/12 1216   PRN Meds:.acetaminophen, acetaminophen, hydrALAZINE, ketoconazole, morphine injection, ondansetron (ZOFRAN) IV, ondansetron  Antibiotics: Anti-infectives   Start     Dose/Rate Route Frequency Ordered Stop   11/16/12 2200  vancomycin (VANCOCIN) 1,500 mg in sodium chloride 0.9 % 500 mL IVPB     1,500 mg 250 mL/hr over 120 Minutes Intravenous Every 24 hours 11/15/12 2020     11/16/12 0300  piperacillin-tazobactam (ZOSYN) IVPB 3.375 g     3.375 g 12.5 mL/hr over 240 Minutes Intravenous Every 8 hours 11/15/12 2020     11/15/12 2030  vancomycin (VANCOCIN) 2,000 mg in sodium chloride 0.9 % 500 mL IVPB     2,000 mg 250 mL/hr over 120 Minutes Intravenous To Emergency Dept 11/15/12 2020 11/16/12 0031   11/15/12 2030  piperacillin-tazobactam (ZOSYN) IVPB 3.375 g     3.375 g 100 mL/hr over 30 Minutes Intravenous  Once 11/15/12 2020 11/15/12 2118       PHYSICAL EXAM: Vital signs in last 24 hours: Filed Vitals:   11/16/12 2200 11/17/12 0541 11/17/12 2055 11/18/12 0546  BP: 105/61 114/68 101/61 124/65  Pulse: 71 68 72 95  Temp: 98.6 F (37 C) 98.4 F (36.9 C) 98.2 F (36.8 C) 98.7 F (37.1 C)  TempSrc: Oral Oral Oral Oral  Resp: 18  20 18 18   Height:      Weight:      SpO2: 100% 98% 100% 96%    Weight change:  Filed Weights   11/15/12 2304  Weight: 102.059 kg (225 lb)   Body mass index is 32.28 kg/(m^2).   Gen Exam: Awake and alert with clear speech.   Neck: Supple, No JVD.   Chest: B/L Clear.   CVS: S1 S2 Regular, no murmurs.  Abdomen: soft, BS +, non tender, non distended.  Extremities: no  edema, Left foot dorsum has large ulcer around 5 cm in diameter. Patient also has sacral decubitus ulcers. Right leg has chronic skin changes with fluid. Neurologic: Non Focal.   Skin: No Rash.  Wounds: N/A.    Intake/Output from previous day:  Intake/Output Summary (Last 24 hours) at 11/18/12 1351 Last data filed at 11/18/12 0546  Gross per 24 hour  Intake      0 ml  Output   1100 ml  Net  -1100 ml     LAB RESULTS: CBC  Recent Labs Lab 11/15/12 1846 11/16/12 0515 11/17/12 0550 11/18/12 0520  WBC 7.5 9.5 8.2 6.9  HGB 11.4* 10.5* 9.6* 10.1*  HCT 33.1* 31.4* 28.9* 29.4*  PLT 335 334 324 343  MCV 88.7 88.7 88.9 88.6  MCH 30.6 29.7 29.5 30.4  MCHC 34.4 33.4 33.2 34.4  RDW 14.5 14.5 14.8 14.8  LYMPHSABS  --  1.0  --   --   MONOABS  --  1.1*  --   --   EOSABS  --  0.1  --   --   BASOSABS  --  0.0  --   --     Chemistries   Recent Labs Lab 11/15/12 1846 11/16/12 0515 11/17/12 0550 11/18/12 0520  NA 133* 137 138 137  K 4.4 3.9 3.5 3.7  CL 96 100 103 103  CO2 25 24 28 27   GLUCOSE 89 96 79 110*  BUN 36* 30* 19 12  CREATININE 1.70* 1.57* 1.41* 1.21  CALCIUM 9.4 9.1 8.3* 8.4    CBG:  Recent Labs Lab 11/17/12 1230 11/17/12 1850 11/17/12 2231 11/18/12 0746 11/18/12 1237  GLUCAP 119* 110* 149* 111* 106*    GFR Estimated Creatinine Clearance: 54.4 ml/min (by C-G formula based on Cr of 1.21).  Coagulation profile No results found for this basename: INR, PROTIME,  in the last 168 hours  Cardiac Enzymes  Recent Labs Lab 11/15/12 1846 11/15/12 2044  CKMB 5.9*  --   TROPONINI  --  <0.30    No components found with this basename: POCBNP,   Recent Labs  11/16/12 0515  DDIMER 3.39*   No results found for this basename: HGBA1C,  in the last 72 hours No results found for this basename: CHOL, HDL, LDLCALC, TRIG, CHOLHDL, LDLDIRECT,  in the last 72 hours No results found for this basename: TSH, T4TOTAL, FREET3, T3FREE, THYROIDAB,  in the last 72  hours No results found for this basename: VITAMINB12, FOLATE, FERRITIN, TIBC, IRON, RETICCTPCT,  in the last 72 hours No results found for this basename: LIPASE, AMYLASE,  in the last 72 hours  Urine Studies No results found for this basename: UACOL, UAPR, USPG, UPH, UTP, UGL, UKET, UBIL, UHGB, UNIT, UROB, ULEU, UEPI, UWBC, URBC, UBAC, CAST, CRYS, UCOM, BILUA,  in the last 72 hours  MICROBIOLOGY: Recent Results (from the past 240 hour(s))  WOUND CULTURE     Status: None   Collection Time    11/15/12 11:21  AM      Result Value Range Status   Specimen Description WOUND LEFT LEG   Final   Special Requests NONE   Final   Gram Stain     Final   Value: RARE WBC PRESENT, PREDOMINANTLY PMN     NO SQUAMOUS EPITHELIAL CELLS SEEN     MODERATE GRAM NEGATIVE RODS     FEW GRAM POSITIVE COCCI     IN PAIRS   Culture Culture reincubated for better growth   Final   Report Status PENDING   Incomplete  MRSA PCR SCREENING     Status: Abnormal   Collection Time    11/15/12 10:21 PM      Result Value Range Status   MRSA by PCR POSITIVE (*) NEGATIVE Final   Comment:            The GeneXpert MRSA Assay (FDA     approved for NASAL specimens     only), is one component of a     comprehensive MRSA colonization     surveillance program. It is not     intended to diagnose MRSA     infection nor to guide or     monitor treatment for     MRSA infections.     RESULT CALLED TO, READ BACK BY AND VERIFIED WITH:     RN W.JACKSON BY L.PITT AT 0056 11/16/12    RADIOLOGY STUDIES/RESULTS: Dg Pelvis 1-2 Views  11/15/2012   *RADIOLOGY REPORT*  Clinical Data: Fall.  Bilateral lower extremity pain.  Left hip pain.  PELVIS - 1-2 VIEW  Comparison: None available.  Findings: A lucency over the left superior.  The ramus is related to overlying soft tissues.  No acute fracture is present. Degenerative changes are noted in the lower lumbar spine and bilateral SI joint.  Mild degenerative changes are present in the hips.   Densities within the anatomic pelvis likely represent phleboliths.  IMPRESSION:  1.  No acute abnormality. 2.  Degenerative changes in the lumbar spine, SI joints, and hips.   Original Report Authenticated By: San Morelle, M.D.   Dg Tibia/fibula Left  11/15/2012   *RADIOLOGY REPORT*  Clinical Data: Fall.  Left-sided pain.  Question osteomyelitis.  LEFT TIBIA AND FIBULA - 2 VIEW  Comparison: Left tibia and fibula radiographs 08/05/2012.  Findings: Degenerative changes are again noted at the knee and ankle.  Atherosclerotic changes are present.  Edematous changes in the lower extremity decreased.  No acute osseous abnormality is present.  IMPRESSION:  1.  Decreasing edematous changes in the lower extremity. 2.  Degenerative changes of the knee and ankle. 3.  Atherosclerosis.   Original Report Authenticated By: San Morelle, M.D.   Dg Tibia/fibula Right  11/15/2012   *RADIOLOGY REPORT*  Clinical Data: Fall.  Pain in both legs.  RIGHT TIBIA AND FIBULA - 2 VIEW  Comparison: Right tibia and fibula films 08/05/2012.  Findings: Chronic degenerative changes are present at the knee and ankle joints.  Edematous changes are present anterior to the tibia. Edema is reduced compared to the prior study.  Vascular calcifications are present.  No acute abnormality is evident.  IMPRESSION:  1.  No acute osseous abnormality. 2.  Decreased edema. 3.  Atherosclerosis. 4.  Degenerative changes at the knee and ankle.   Original Report Authenticated By: San Morelle, M.D.   Ct Head Wo Contrast  11/15/2012   *RADIOLOGY REPORT*  Clinical Data: Fall  CT HEAD WITHOUT CONTRAST  Technique:  Contiguous axial  images were obtained from the base of the skull through the vertex without contrast.  Comparison: Prior CT from 03/15/2006  Findings: Diffuse prominence of the CSF containing spaces is again seen, consistent with generalized atrophy. Scattered and confluent hypodensity within the periventricular white matter is  consistent with chronic small vessel ischemic changes.  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  No acute intracranial hemorrhage or infarct.  No extra-axial fluid collection.  The calvarium is intact.  Paranasal sinuses and mastoid air cells are clear.  IMPRESSION: 1.  Atrophy with chronic small vessel ischemic changes.  No acute intracranial hemorrhage or infarct. 2.  Unchanged remote right basal ganglia and periventricular white matter lacunar infarcts.   Original Report Authenticated By: Jeannine Boga, M.D.   Mr Foot Left Wo Contrast  10/17/2012   *RADIOLOGY REPORT*  Clinical Data: Diabetic ulcer on the dorsum of the foot.  MRI OF THE LEFT FOREFOOT WITHOUT CONTRAST  Technique:  Multiplanar, multisequence MR imaging was performed. No intravenous contrast was administered.  Comparison: Radiographs dated 10/15/2012  Findings: There is a superficial 3.1 x 2.6 x 0.6 cm ulceration on the dorsal lateral aspect of the distal forefoot overlying the distal third fourth and fifth metatarsals.  There is no abscess in the foot.  There is no osteomyelitis or septic joint.  There is slight arthritis of the first metatarsal phalangeal joint.  The flexor and extensor tendons appear normal.  There is edema in the soft tissues of the dorsum of the foot could represent cellulitis.  IMPRESSION:  1.  Soft tissue ulceration and adjacent edema consistent with cellulitis on the dorsum of the foot. 2.  No deep extension into the muscles, tendons, or bones. 3.  No abscesses.   Original Report Authenticated By: Lorriane Shire, M.D.   Dg Foot Complete Left  11/15/2012   *RADIOLOGY REPORT*  Clinical Data: Osteomyelitis.  LEFT FOOT - COMPLETE 3+ VIEW  Comparison: Left foot radiographs 10/15/2012.  MRI of the left foot 10/17/2012.  Findings: Progressive lucency is evident within the metatarsal head as, concerning for erosions and osteomyelitis.  Persistent dorsal soft  tissue swelling is evident, compatible with cellulitis. Diffuse osteopenia is evident.  IMPRESSION:  1.  Progressive lucency in the metatarsal heads concerning for developing osteomyelitis. 2.  Persistent soft tissue swelling over the dorsum of the foot compatible with cellulitis.   Original Report Authenticated By: San Morelle, M.D.    Birdie Hopes, MD  [facility] Pager:[phone]  If 7PM-7AM, please contact night-coverage [email or web address] Password [email or web address] 11/18/2012, 1:51 PM   LOS: 3 days

## 2012-11-18 NOTE — Progress Notes (Signed)
Needs abi to eval leg perfusion Ulcer left foot does not communicate with bone Bone demineralized but not definitively osteomyelytic by plain xray by second opinion radiological review Need scan mri and abi for surgical planning

## 2012-11-19 LAB — WOUND CULTURE

## 2012-11-19 LAB — BASIC METABOLIC PANEL
CO2: 26 mEq/L (ref 19–32)
Chloride: 102 mEq/L (ref 96–112)
Creatinine, Ser: 1.36 mg/dL — ABNORMAL HIGH (ref 0.50–1.35)
GFR calc Af Amer: 53 mL/min — ABNORMAL LOW (ref >90)
Potassium: 3.9 mEq/L (ref 3.5–5.1)
Sodium: 138 mEq/L (ref 135–145)

## 2012-11-19 LAB — GLUCOSE, CAPILLARY: Glucose-Capillary: 110 mg/dL — ABNORMAL HIGH (ref 70–99)

## 2012-11-19 MED ORDER — DOXYCYCLINE HYCLATE 100 MG PO CAPS: 100.0000 mg | ORAL_CAPSULE | Freq: Two times a day (BID) | ORAL | Status: DC

## 2012-11-19 MED ORDER — AMOXICILLIN 500 MG PO CAPS: 500.0000 mg | ORAL_CAPSULE | Freq: Three times a day (TID) | ORAL | Status: DC

## 2012-11-19 NOTE — Progress Notes (Signed)
NURSING PROGRESS NOTE  Shaffer Carry [id number] Discharge Data: 11/19/2012 3:42 PM Attending Provider: Verlee Monte, MD PCP:No PCP Per Patient     [name] to be D/C'd Skilled nursing facility per MD order.    All IV's discontinued with no bleeding noted.  All belongings returned to patient for patient to take home.   Last Vital Signs:  Blood pressure 138/87, pulse 68, temperature 98.2 F (36.8 C), temperature source Oral, resp. rate 18, height 5\' 10"  (1.778 m), weight 102.059 kg (225 lb), SpO2 96.00%.  Discharge Medication List   Medication List    STOP taking these medications       furosemide 40 MG tablet  Commonly known as:  LASIX     indomethacin 25 MG capsule  Commonly known as:  INDOCIN     losartan 25 MG tablet  Commonly known as:  COZAAR      TAKE these medications       allopurinol 100 MG tablet  Commonly known as:  ZYLOPRIM  Take 100 mg by mouth daily.     amoxicillin 500 MG capsule  Commonly known as:  AMOXIL  Take 1 capsule (500 mg total) by mouth 3 (three) times daily.     doxycycline 100 MG capsule  Commonly known as:  VIBRAMYCIN  Take 1 capsule (100 mg total) by mouth 2 (two) times daily.     glimepiride 2 MG tablet  Commonly known as:  AMARYL  Take 2 mg by mouth daily before breakfast.     ketoconazole 2 % cream  Commonly known as:  NIZORAL  Apply 1 application topically as needed (for dry skin).     verapamil 240 MG CR tablet  Commonly known as:  CALAN-SR  Take 240 mg by mouth daily.        Joslyn Hy, MSN, RN, [employer]

## 2012-11-19 NOTE — Evaluation (Signed)
Occupational Therapy Evaluation Patient Details Name: [name] [name] MRN: [id number] DOB: 31-Jan-1927 Today's Date: 11/19/2012 Time: [id number] OT Time Calculation (min): 14 min  OT Assessment / Plan / Recommendation History of present illness Pt adm with acute renal failure after being found down at home.  Pt also with chronic leg ulcers and now with possible ostoemyelitis.   Clinical Impression   Pt admitted with above. Pt will benefit from continued acute OT services to address below problem list. Recommending SNF to further progress rehab.    OT Assessment  Patient needs continued OT Services    Follow Up Recommendations  SNF    Barriers to Discharge      Equipment Recommendations  3 in 1 bedside comode    Recommendations for Other Services    Frequency  Min 2X/week    Precautions / Restrictions Precautions Precautions: Fall   Pertinent Vitals/Pain See vitals    ADL  Grooming: Performed;Wash/dry hands;Teeth care;Wash/dry face;Set up Where Assessed - Grooming: Supported sitting Lower Body Dressing: Performed;Maximal assistance Where Assessed - Lower Body Dressing: Unsupported sitting Toilet Transfer: Simulated;Moderate assistance Toilet Transfer Method: Sit to Loss adjuster, chartered:  (chair) Equipment Used: Gait belt;Rolling walker Transfers/Ambulation Related to ADLs: sit<>stand x 2 trials from chair. ADL Comments: Pt unable to fully reach anteriorly to don/doff socks while sitting EOC.    OT Diagnosis: Generalized weakness;Acute pain  OT Problem List: Decreased strength;Decreased activity tolerance;Impaired balance (sitting and/or standing);Decreased knowledge of use of DME or AE;Obesity;Pain OT Treatment Interventions: Self-care/ADL training;DME and/or AE instruction;Therapeutic activities;Patient/family education;Balance training   OT Goals(Current goals can be found in the care plan section) Acute Rehab OT Goals Patient Stated Goal: to go to SNF OT Goal  Formulation: With patient Time For Goal Achievement: 12/03/12 Potential to Achieve Goals: Good  Visit Information  Last OT Received On: 11/19/12 Assistance Needed: +2 History of Present Illness: Pt adm with acute renal failure after being found down at home.  Pt also with chronic leg ulcers and now with possible ostoemyelitis.       Prior [place] expects to be discharged to:: Skilled nursing facility Prior Function Level of Independence: Independent with assistive device(s) Comments: Per chart pt uses a scooter Communication Communication: No difficulties         Vision/Perception     Cognition  Cognition Arousal/Alertness: Awake/alert Behavior During Therapy: WFL for tasks assessed/performed Overall Cognitive Status: Within Functional Limits for tasks assessed    Extremity/Trunk Assessment Upper Extremity Assessment Upper Extremity Assessment: Generalized weakness     Mobility Bed Mobility Bed Mobility: Not assessed Transfers Transfers: Sit to Stand;Stand to Sit Sit to Stand: 3: Mod assist;From chair/3-in-1;With upper extremity assist;With armrests Stand to Sit: 3: Mod assist;To chair/3-in-1;With upper extremity assist;With armrests Details for Transfer Assistance: VCs for safe hand placment. Assist for power up from chair.     Exercise     Balance     End of Session OT - End of Session Equipment Utilized During Treatment: Gait belt;Rolling walker Activity Tolerance: Patient tolerated treatment well Patient left: with call bell/phone within reach  GO    11/19/2012 Darrol Jump OTR/L Pager [phone] Office [phone]  Darrol Jump 11/19/2012, 4:01 PM

## 2012-11-19 NOTE — Discharge Summary (Signed)
Physician Discharge Summary  [name] [id number] DOB: Sep 11, 1926 DOA: 11/15/2012  PCP: No PCP Per Patient  Admit date: 11/15/2012 Discharge date: 11/19/2012  Time spent: 40 minutes  Recommendations for Outpatient Follow-up:  1. Followup with primary care physician within one week. 2. Followup with Dr. Sharol Given of [facility] orthopedics within 3-4 weeks 3. Check BMP in 1 week  Discharge Diagnoses:  Principal Problem:   Acute renal failure Active Problems:   Cellulitis   Diabetic ulcer of left foot   Fall   Discharge Condition: Stable  Diet recommendation: Carbohydrate modified diet  Filed Weights   11/15/12 2304  Weight: 102.059 kg (225 lb)    History of present illness:  [name] [name] is a 79 y.o. male was brought to the ER from a boarding house after patient had a fall. Patient is a poor historian. Patient states he had a fall today when he was trying to get out of the bed. Denies having hit his head or lost consciousness. In addition patient was stating that he has increased drainage from his lower extremities. Patient denies any chest pain shortness of breath nausea vomiting abdominal pain fever chills diarrhea. In the ER patient's labs show worsening of his creatinine. In addition patient is mildly tachycardic and his wounds of his lower extremities show increased seepage. X-ray shows possibility of left foot osteomyelitis. Patient has been admitted for further management.  Hospital Course:   1. Acute renal failure: Likely secondary to dehydration complicated by nephrotoxic medications including Lasix and losartan. At the time of admission losartan and Lasix were held and the patient started on IV for was for hydration. His creatinine improved from 1.7 to 1.2, he is 1.3 today on discharge. His Lasix and losartan still on hold, losartan can be restarted on renal functional plateau, check BMP in 1 week to decide about restarting losartan. Lasix can be restarted if patient developed  lower extremity edema. 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 Fall: Suspect mechanical fall rather than syncope, patient did not lose consciousness. Patient has lower extremity pain secondary to chronic ulcers and questionable orthostatic hypotension because been on diuretics and antihypertensives. Patient seen by PT/OT and they both recommended nursing home placement. Please note that a CT scan of the head was negative upon admission.  3. Chronic bilateral lower extremity ulcers: Upon admission ESR was 129 and x-ray was suggestive of osteomyelitis, Dr. Marlou Sa of orthopedics was consulted. MRI of the left foot was done and showed only cellulitis without evidence of deep tissue infection like osteomyelitis. This was discussed with Dr. Marlou Sa of [facility] orthopedics and he recommended no surgical intervention for now. Dr. Marlou Sa recommended to continue antibiotics for 10-14 days and followup with Dr. Sharol Given as outpatient.  4. Hypertension: Patient is on verapamil, Cozaar and Lasix. Cozaar and Lasix were held because of renal disease. Patient is diabetic and probably will need Cozaar to be restarted on his kidney function plateau.  5. Diabetes: Type 2 diabetes mellitus, very well controlled, patient is on Amaryl his hemoglobin A1c on 10/16/2012 was 5.8. Continue Amaryl as outpatient.  Procedures:  Right Lower extremity Doppler showed no evidence of DVT on 11/16/2012.  [place] team  Consultations:  Alphonzo Severance of orthopedic service  Discharge Exam: Filed Vitals:   11/17/12 2055 11/18/12 0546 11/18/12 2146 11/19/12 0500  BP: 101/61 124/65 116/67 153/83  Pulse: 72 95 71 67  Temp: 98.2 F (36.8 C) 98.7 F (37.1 C) 98.8  F (37.1 C) 97.6 F (36.4 C)  TempSrc: Oral Oral Oral Oral  Resp: 18 18 18 20   Height:      Weight:      SpO2: 100% 96% 97% 97%   General: Alert and awake, oriented x3, not in any acute distress. HEENT: anicteric sclera, pupils  reactive to light and accommodation, EOMI CVS: S1-S2 clear, no murmur rubs or gallops Chest: clear to auscultation bilaterally, no wheezing, rales or rhonchi Abdomen: soft nontender, nondistended, normal bowel sounds, no organomegaly Extremities: Left lower extremity dorsal ulcer. Neuro: Cranial nerves II-XII intact, no focal neurological deficits  Discharge Instructions      Discharge Orders   Future Orders Complete By Expires     Diet Carb Modified  As directed     Increase activity slowly  As directed         Medication List    STOP taking these medications       furosemide 40 MG tablet  Commonly known as:  LASIX     indomethacin 25 MG capsule  Commonly known as:  INDOCIN     losartan 25 MG tablet  Commonly known as:  COZAAR      TAKE these medications       allopurinol 100 MG tablet  Commonly known as:  ZYLOPRIM  Take 100 mg by mouth daily.     amoxicillin 500 MG capsule  Commonly known as:  AMOXIL  Take 1 capsule (500 mg total) by mouth 3 (three) times daily.     doxycycline 100 MG capsule  Commonly known as:  VIBRAMYCIN  Take 1 capsule (100 mg total) by mouth 2 (two) times daily.     glimepiride 2 MG tablet  Commonly known as:  AMARYL  Take 2 mg by mouth daily before breakfast.     ketoconazole 2 % cream  Commonly known as:  NIZORAL  Apply 1 application topically as needed (for dry skin).     verapamil 240 MG CR tablet  Commonly known as:  CALAN-SR  Take 240 mg by mouth daily.       No Known Allergies    The results of significant diagnostics from this hospitalization (including imaging, microbiology, ancillary and laboratory) are listed below for reference.    Significant Diagnostic Studies: Dg Pelvis 1-2 Views  11/15/2012   *RADIOLOGY REPORT*  Clinical Data: Fall.  Bilateral lower extremity pain.  Left hip pain.  PELVIS - 1-2 VIEW  Comparison: None available.  Findings: A lucency over the left superior.  The ramus is related to overlying  soft tissues.  No acute fracture is present. Degenerative changes are noted in the lower lumbar spine and bilateral SI joint.  Mild degenerative changes are present in the hips.  Densities within the anatomic pelvis likely represent phleboliths.  IMPRESSION:  1.  No acute abnormality. 2.  Degenerative changes in the lumbar spine, SI joints, and hips.   Original Report Authenticated By: San Morelle, M.D.   Dg Tibia/fibula Left  11/15/2012   *RADIOLOGY REPORT*  Clinical Data: Fall.  Left-sided pain.  Question osteomyelitis.  LEFT TIBIA AND FIBULA - 2 VIEW  Comparison: Left tibia and fibula radiographs 08/05/2012.  Findings: Degenerative changes are again noted at the knee and ankle.  Atherosclerotic changes are present.  Edematous changes in the lower extremity decreased.  No acute osseous abnormality is present.  IMPRESSION:  1.  Decreasing edematous changes in the lower extremity. 2.  Degenerative changes of the knee and ankle. 3.  Atherosclerosis.   Original Report Authenticated By: San Morelle, M.D.   Dg Tibia/fibula Right  11/15/2012   *RADIOLOGY REPORT*  Clinical Data: Fall.  Pain in both legs.  RIGHT TIBIA AND FIBULA - 2 VIEW  Comparison: Right tibia and fibula films 08/05/2012.  Findings: Chronic degenerative changes are present at the knee and ankle joints.  Edematous changes are present anterior to the tibia. Edema is reduced compared to the prior study.  Vascular calcifications are present.  No acute abnormality is evident.  IMPRESSION:  1.  No acute osseous abnormality. 2.  Decreased edema. 3.  Atherosclerosis. 4.  Degenerative changes at the knee and ankle.   Original Report Authenticated By: San Morelle, M.D.   Ct Head Wo Contrast  11/15/2012   *RADIOLOGY REPORT*  Clinical Data: Fall  CT HEAD WITHOUT CONTRAST  Technique:  Contiguous axial images were obtained from the base of the skull through the vertex without contrast.  Comparison: Prior CT from 03/15/2006  Findings:  Diffuse prominence of the CSF containing spaces is again seen, consistent with generalized atrophy. Scattered and confluent hypodensity within the periventricular white matter is consistent with chronic small vessel ischemic changes.  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  No acute intracranial hemorrhage or infarct.  No extra-axial fluid collection.  The calvarium is intact.  Paranasal sinuses and mastoid air cells are clear.  IMPRESSION: 1.  Atrophy with chronic small vessel ischemic changes.  No acute intracranial hemorrhage or infarct. 2.  Unchanged remote right basal ganglia and periventricular white matter lacunar infarcts.   Original Report Authenticated By: Jeannine Boga, M.D.   Mr Foot Left W Wo Contrast  11/18/2012   *RADIOLOGY REPORT*  Clinical Data: Severe left forefoot pain.  Ulceration of the soft tissues between the fourth and fifth metatarsal heads.  MRI OF THE LEFT FOREFOOT WITHOUT AND WITH CONTRAST  Technique:  Multiplanar, multisequence MR imaging was performed both before and after administration of intravenous contrast.  Contrast: 54mL MULTIHANCE GADOBENATE DIMEGLUMINE 529 MG/ML IV SOLN  Comparison: Radiographs dated 11/15/2012  Findings: There is a superficial soft tissue ulceration there is approximately 3 x 4 cm but only 4 mm in depth.  There is no underlying soft tissue abscess.  There is no osteomyelitis or septic joint.  The infection does not extend to the tendons or muscles.  There are no joint effusions.  IMPRESSION: Superficial soft tissue ulceration with adjacent cellulitis on the dorsum of the foot overlying the distal fourth and fifth metatarsals.  No deep extension or abscess or osteomyelitis.   Original Report Authenticated By: Lorriane Shire, M.D.   Dg Foot Complete Left  11/15/2012   *RADIOLOGY REPORT*  Clinical Data: Osteomyelitis.  LEFT FOOT - COMPLETE 3+ VIEW  Comparison: Left foot radiographs  10/15/2012.  MRI of the left foot 10/17/2012.  Findings: Progressive lucency is evident within the metatarsal head as, concerning for erosions and osteomyelitis.  Persistent dorsal soft tissue swelling is evident, compatible with cellulitis. Diffuse osteopenia is evident.  IMPRESSION:  1.  Progressive lucency in the metatarsal heads concerning for developing osteomyelitis. 2.  Persistent soft tissue swelling over the dorsum of the foot compatible with cellulitis.   Original Report Authenticated By: San Morelle, M.D.    Microbiology: Recent Results (from the past 240 hour(s))  WOUND CULTURE     Status: None   Collection Time    11/15/12 11:21 AM      Result Value Range Status  Specimen Description WOUND LEFT LEG   Final   Special Requests NONE   Final   Gram Stain     Final   Value: RARE WBC PRESENT, PREDOMINANTLY PMN     NO SQUAMOUS EPITHELIAL CELLS SEEN     MODERATE GRAM NEGATIVE RODS     FEW GRAM POSITIVE COCCI     IN PAIRS   Culture     Final   Value: MULTIPLE ORGANISMS PRESENT, NONE PREDOMINANT     Note: NO STAPHYLOCOCCUS AUREUS ISOLATED NO GROUP A STREP (S.PYOGENES) ISOLATED   Report Status 11/19/2012 FINAL   Final  MRSA PCR SCREENING     Status: Abnormal   Collection Time    11/15/12 10:21 PM      Result Value Range Status   MRSA by PCR POSITIVE (*) NEGATIVE Final   Comment:            The GeneXpert MRSA Assay (FDA     approved for NASAL specimens     only), is one component of a     comprehensive MRSA colonization     surveillance program. It is not     intended to diagnose MRSA     infection nor to guide or     monitor treatment for     MRSA infections.     RESULT CALLED TO, READ BACK BY AND VERIFIED WITH:     RN W.JACKSON BY L.PITT AT 0056 11/16/12     Labs: Basic Metabolic Panel:  Recent Labs Lab 11/15/12 1846 11/16/12 0515 11/17/12 0550 11/18/12 0520 11/19/12 0840  NA 133* 137 138 137 138  K 4.4 3.9 3.5 3.7 3.9  CL 96 100 103 103 102  CO2 25 24  28 27 26   GLUCOSE 89 96 79 110* 93  BUN 36* 30* 19 12 10   CREATININE 1.70* 1.57* 1.41* 1.21 1.36*  CALCIUM 9.4 9.1 8.3* 8.4 9.0   Liver Function Tests:  Recent Labs Lab 11/15/12 1846 11/16/12 0515  AST 40* 32  ALT 18 15  ALKPHOS 88 82  BILITOT 0.9 1.1  PROT 7.7 6.9  ALBUMIN 2.4* 2.4*   No results found for this basename: LIPASE, AMYLASE,  in the last 168 hours No results found for this basename: AMMONIA,  in the last 168 hours CBC:  Recent Labs Lab 11/15/12 1846 11/16/12 0515 11/17/12 0550 11/18/12 0520  WBC 7.5 9.5 8.2 6.9  NEUTROABS  --  7.2  --   --   HGB 11.4* 10.5* 9.6* 10.1*  HCT 33.1* 31.4* 28.9* 29.4*  MCV 88.7 88.7 88.9 88.6  PLT 335 334 324 343   Cardiac Enzymes:  Recent Labs Lab 11/15/12 1846 11/15/12 2044  CKTOTAL 421*  --   CKMB 5.9*  --   TROPONINI  --  <0.30   BNP: BNP (last 3 results)  Recent Labs  08/05/12 1627  PROBNP 654.3*   CBG:  Recent Labs Lab 11/18/12 0746 11/18/12 1237 11/18/12 1655 11/18/12 2145 11/19/12 0721  GLUCAP 111* 106* 114* 131* 91       Signed:  Melisse Caetano A  [facility] 11/19/2012, 11:53 AM

## 2012-11-19 NOTE — Clinical Social Work Placement (Signed)
Clinical Social Work Department CLINICAL SOCIAL WORK PLACEMENT NOTE 11/19/2012  Patient:  [name] [name], [name] [name]  Account Number:  [id number] Admit date:  11/15/2012  Clinical Social Worker:  Kemper Durie, [place]  Date/time:  11/18/2012 03:23 PM  Clinical Social Work is seeking post-discharge placement for this patient at the following level of care:   SKILLED NURSING   (*CSW will update this form in Epic as items are completed)   11/17/2012  Patient/family provided with [facility] Department of Clinical Social Works list of facilities offering this level of care within the geographic area requested by the patient (or if unable, by the patients family).  11/17/2012  Patient/family informed of their freedom to choose among providers that offer the needed level of care, that participate in Medicare, Medicaid or managed care program needed by the patient, have an available bed and are willing to accept the patient.  11/17/2012  Patient/family informed of [facility] ownership interest in [facility], as well as of the fact that they are under no obligation to receive care at this facility.  PASARR submitted to EDS on 11/17/2012 PASARR number received from EDS on 11/17/2012  FL2 transmitted to all facilities in geographic area requested by pt/family on  11/18/2012 FL2 transmitted to all facilities within larger geographic area on 11/18/2012  Patient informed that his/her managed care company has contracts with or will negotiate with  certain facilities, including the following:     Patient/family informed of bed offers received:  11/18/2012 Patient chooses bed at [facility] Physician recommends and patient chooses bed at    Patient to be transferred to [facility] on  11/19/2012 Patient to be transferred to facility by Ambulance  The following physician request were entered in Epic:   Additional Comments:    Liz Beach, Richrd Sox, [id number]

## 2012-11-19 NOTE — Progress Notes (Signed)
Physical Therapy Treatment Patient Details Name: [name] [name] MRN: [id number] DOB: [birth year] Today's Date: 11/19/2012 Time: [id number] PT Time Calculation (min): 23 min  PT Assessment / Plan / Recommendation  History of Present Illness Pt adm with acute renal failure after being found down at home.  Pt also with chronic leg ulcers and now with possible ostoemyelitis.   Clinical Impression *pt now participating with PT, he is alert and oriented, able to pivot to recliner with mod Assist, SNF recommended**   PT Comments   *Pt states he used scooter PTA and walked short distances with RW. Pt improving with mobility. **  Follow Up Recommendations  SNF     Does the patient have the potential to tolerate intense rehabilitation     Barriers to Discharge        Equipment Recommendations  None recommended by PT    Recommendations for Other Services    Frequency Min 3X/week   Progress towards PT Goals Progress towards PT goals: Progressing toward goals  Plan Current plan remains appropriate    Precautions / Restrictions Precautions Precautions: Fall Restrictions Weight Bearing Restrictions: No   Pertinent Vitals/Pain **no c/o pain*    Mobility  Bed Mobility Bed Mobility: Not assessed Transfers Transfers: Sit to Stand;Stand to Sit;Stand Pivot Transfers Sit to Stand: From chair/3-in-1;With armrests;3: Mod assist Stand to Sit: To bed;To chair/3-in-1;With armrests;3: Mod assist Stand Pivot Transfers: 3: Mod assist Details for Transfer Assistance: Mod assist for SPT x 2, 3 in 1 to bed, then bed to recliner Ambulation/Gait Ambulation/Gait Assistance: 3: Mod assist Ambulation Distance (Feet): 3 Feet Assistive device: Rolling walker Gait Pattern: Trunk flexed;Decreased step length - right;Decreased step length - left General Gait Details: trunk significantly flexed, unable to correct with verbal/tactile cues    Exercises     PT Diagnosis:    PT Problem List:   PT Treatment  Interventions:     PT Goals (current goals can now be found in the care plan section) Acute Rehab PT Goals Patient Stated Goal: to go to SNF PT Goal Formulation: With patient Time For Goal Achievement: 11/30/12 Potential to Achieve Goals: Fair  Visit Information  Last PT Received On: 11/19/12 Assistance Needed: +2 History of Present Illness: Pt adm with acute renal failure after being found down at home.  Pt also with chronic leg ulcers and now with possible ostoemyelitis.    Subjective Data  Patient Stated Goal: to go to SNF   Cognition  Cognition Arousal/Alertness: Awake/alert Behavior During Therapy: WFL for tasks assessed/performed Overall Cognitive Status: Within Functional Limits for tasks assessed    Balance  Balance Balance Assessed: Yes Static Sitting Balance Static Sitting - Balance Support: No upper extremity supported;Feet supported Static Sitting - Level of Assistance: 6: Modified independent (Device/Increase time) Static Sitting - Comment/# of Minutes: 2 Static Standing Balance Static Standing - Balance Support: Bilateral upper extremity supported;During functional activity Static Standing - Level of Assistance: 5: Stand by assistance Static Standing - Comment/# of Minutes: 2 min for peri-care after having BM, pt stood with RW with trunk flexed  End of Session PT - End of Session Activity Tolerance: Patient tolerated treatment well Patient left: in chair Nurse Communication: Mobility status   GP     Blondell Reveal Kistler 11/19/2012, 9:42 AM [phone]

## 2013-05-06 ENCOUNTER — Other Ambulatory Visit: Payer: Self-pay | Admitting: *Deleted

## 2013-05-06 DIAGNOSIS — I70269 Atherosclerosis of native arteries of extremities with gangrene, unspecified extremity: Secondary | ICD-10-CM

## 2013-05-06 DIAGNOSIS — R0989 Other specified symptoms and signs involving the circulatory and respiratory systems: Secondary | ICD-10-CM

## 2013-05-06 DIAGNOSIS — L97909 Non-pressure chronic ulcer of unspecified part of unspecified lower leg with unspecified severity: Secondary | ICD-10-CM

## 2013-05-19 ENCOUNTER — Encounter: Payer: Self-pay | Admitting: Vascular Surgery

## 2013-05-20 ENCOUNTER — Encounter: Payer: Medicare Other | Admitting: Vascular Surgery

## 2013-05-20 ENCOUNTER — Encounter (HOSPITAL_COMMUNITY): Payer: Medicare Other

## 2013-05-20 ENCOUNTER — Other Ambulatory Visit (HOSPITAL_COMMUNITY): Payer: Medicare Other

## 2013-06-17 ENCOUNTER — Encounter (HOSPITAL_COMMUNITY): Payer: Medicare Other

## 2013-06-17 ENCOUNTER — Encounter: Payer: Medicare Other | Admitting: Vascular Surgery

## 2013-06-17 ENCOUNTER — Other Ambulatory Visit (HOSPITAL_COMMUNITY): Payer: Medicare Other

## 2013-07-21 ENCOUNTER — Encounter: Payer: Self-pay | Admitting: Vascular Surgery

## 2013-07-22 ENCOUNTER — Ambulatory Visit (INDEPENDENT_AMBULATORY_CARE_PROVIDER_SITE_OTHER): Payer: Medicare Other | Admitting: Vascular Surgery

## 2013-07-22 ENCOUNTER — Ambulatory Visit (HOSPITAL_COMMUNITY)
Admission: RE | Admit: 2013-07-22 | Discharge: 2013-07-22 | Disposition: A | Discharge status: Home / Self Care | Payer: Medicare Other | Source: Ambulatory Visit | Attending: Vascular Surgery | Admitting: Vascular Surgery

## 2013-07-22 ENCOUNTER — Encounter: Payer: Self-pay | Admitting: Vascular Surgery

## 2013-07-22 ENCOUNTER — Ambulatory Visit (INDEPENDENT_AMBULATORY_CARE_PROVIDER_SITE_OTHER)
Admission: RE | Admit: 2013-07-22 | Discharge: 2013-07-22 | Disposition: A | Discharge status: Home / Self Care | Payer: Medicare Other | Source: Ambulatory Visit | Attending: Vascular Surgery | Admitting: Vascular Surgery

## 2013-07-22 VITALS — BP 127/58 | HR 60 | Ht 70.0 in | Wt 229.0 lb

## 2013-07-22 DIAGNOSIS — L98499 Non-pressure chronic ulcer of skin of other sites with unspecified severity: Principal | ICD-10-CM

## 2013-07-22 DIAGNOSIS — I70269 Atherosclerosis of native arteries of extremities with gangrene, unspecified extremity: Secondary | ICD-10-CM

## 2013-07-22 DIAGNOSIS — I7025 Atherosclerosis of native arteries of other extremities with ulceration: Secondary | ICD-10-CM | POA: Insufficient documentation

## 2013-07-22 DIAGNOSIS — L97909 Non-pressure chronic ulcer of unspecified part of unspecified lower leg with unspecified severity: Secondary | ICD-10-CM

## 2013-07-22 DIAGNOSIS — I739 Peripheral vascular disease, unspecified: Secondary | ICD-10-CM

## 2013-07-22 DIAGNOSIS — R0989 Other specified symptoms and signs involving the circulatory and respiratory systems: Secondary | ICD-10-CM

## 2013-07-22 NOTE — Progress Notes (Signed)
[facility] HISTORY AND PHYSICAL   CC:  Numbness in feet  Newt Minion, MD  HPI: This is a 80 y.o. male with diabetes who presents today with the main complaint of numbness in his feet.  He states that he has an occasional sharp pain that may last a second or two which radiates down around his ankle, but then it resolves.  He states that he has a skin problem on his legs and a nurse comes to his house to wrap his legs twice a week.  He did have an ulcer on his foot last July '14, but this has completely healed.  He lives at home and ambulates with a walker.  He states that he does not get pain when walking and that it actually feels good to walk.  He has a PMH significant for diabetes and is on amaryl.  He states that he had a stroke several years ago and is weak from this in general.  He has HTN and is on medication for this as well.  He has a remote history of tobacco use.  Past Medical History  Diagnosis Date  . Hypertension   . Lymphadenitis 08/10/2012  . Gout   . Chronic kidney disease     RENAL INSUFICCIENCY  . Diabetes mellitus without complication   . DVT (deep venous thrombosis)    Past Surgical History: denies any procedures  No Known Allergies  Current Outpatient Prescriptions  Medication Sig Dispense Refill  . allopurinol (ZYLOPRIM) 100 MG tablet Take 300 mg by mouth daily.       . clobetasol cream (TEMOVATE) 0.05 %       . furosemide (LASIX) 40 MG tablet       . glimepiride (AMARYL) 2 MG tablet Take 2 mg by mouth daily before breakfast.      . losartan (COZAAR) 25 MG tablet       . traMADol (ULTRAM) 50 MG tablet       . verapamil (CALAN-SR) 240 MG CR tablet Take 240 mg by mouth daily.       Marland Kitchen amoxicillin (AMOXIL) 500 MG capsule Take 1 capsule (500 mg total) by mouth 3 (three) times daily.  30 capsule  0  . doxycycline (VIBRAMYCIN) 100 MG capsule Take 1 capsule (100 mg total) by mouth 2 (two) times daily.  20 capsule  0  . ketoconazole  (NIZORAL) 2 % cream Apply 1 application topically as needed (for dry skin).       No current facility-administered medications for this visit.   Family History: patient cannot remember his parent's past medical history  History   Social History  . Marital Status: Widowed    Spouse Name: N/A    Number of Children: N/A  . Years of Education: N/A   Occupational History  . Not on file.   Social History Main Topics  . Smoking status: Current Some Day Smoker -- 40 years    Types: Pipe  . Smokeless tobacco: Never Used  . Alcohol Use: No  . Drug Use: No  . Sexual Activity: No   Other Topics Concern  . Not on file   Social History Narrative  . No narrative on file     ROS: [x]  Positive   [ ]  Negative   [ ]  All sytems reviewed and are negative  Cardiovascular: []  chest pain/pressure []  palpitations []  SOB lying flat []  DOE []  pain in legs while walking [x]  pain in feet  when lying flat []  hx of DVT []  hx of phlebitis [x]  swelling in legs []  varicose veins [x]  numbness in both feet   Pulmonary: []  productive cough []  asthma []  wheezing  Neurologic: []  weakness in []  left arms []  legs []  numbness in []  arms []  legs [x]  feet [] difficulty speaking or slurred speech []  temporary loss of vision in one eye []  dizziness [x]  hx CVA  Hematologic: []  bleeding problems []  problems with blood clotting easily  GI []  vomiting blood []  blood in stool  GU: []  burning with urination []  blood in urine  Psychiatric: []  hx of major depression  Integumentary: []  rashes []  ulcers  Constitutional: []  fever []  chills   PHYSICAL EXAMINATION:  Filed Vitals:   07/22/13 1350  BP: 127/58  Pulse: 60   Body mass index is 32.86 kg/(m^2).  General:  WDWN in NAD, somewhat demented Gait: Not observed HENT: WNL, normocephalic Eyes: Pupils equal Pulmonary: normal non-labored breathing , without Rales, rhonchi,  wheezing Cardiac: RRR, without  Murmurs, rubs or gallops;  without carotid bruits Abdomen: soft, NT, no masses Skin: no rashes, see extremities for leg exam Vascular Exam/Pulses: 2+ radial pulses bilaterally; 1+ ulnar pulses bilaterally; 2+ femoral pulses; pedal pulses are not palpable. Extremities: without ischemic changes, without Gangrene , without cellulitis; without open wounds; verrucous and woody skin in both feet, no open wounds Musculoskeletal: no muscle wasting or atrophy  Neurologic: A&O X 3; Appropriate Affect ; SENSATION: normal; MOTOR FUNCTION: Left arm is slightly weaker than right; Speech is fluent/normal  Non-Invasive Vascular Imaging:    ABI (Date: 07/22/2013)  R: 0.41 , DP: mono, PT: absent, TBI: 0.44  L: 0.57, DP: mono, PT: absent, TBI: 0.21   Pt meds includes: Statin:  no Beta Blocker:  no Aspirin:  no ACEI:  no ARB:  yes Other Antiplatelet/Anticoagulant:  no   ASSESSMENT/PLAN: 80 y.o. male with severe PAD with main c/o numbness.  He continues to be ambulatory with a walker.  He does not have any non healing wounds on his feet.  He does not have any claudication symptoms.   Would not perform any intervention at this time as this could potentially make him worse.  Dr. Bridgett Larsson will see him back in in 3 months with ABI's.  Pt is in agreement with this plan.  He may get some benefit from starting Neurtontin and we will leave this decision up to his PCP.   Leontine Locket, PA-C [facility] [phone]  Clinic MD:  Pt seen and examined in conjunction with Dr. Bridgett Larsson  Addendum  I have independently interviewed and examined the patient, and I agree with the physician assistant's findings.    A/P: 1/ Severe PAD bilateral  2/ BLE chronic venous insufficiency (C5).   3/ Diabetes with neuropathy  He is relatively asx partially due to his peripheral neuropathy.  I suspect his ambulation is limited.  I'm surprised he has got more wound issues with the combination of severe PAD and CVI.  At this this point  with the patient's mild dementia, I'm not certain he really understands his disease process.  As he's relatively asx currently, I don't recommend any immediate interventions.  If his feet or legs develop wounds, he likely is going to need an aortogram with bilateral leg runoff.    I doubt this patient is a candidate for surgical intervention.  He will follow up in 3 months for continued BLE ABI.  Adele Barthel, MD [facility] Office: [phone] Pager: [phone]  07/22/2013, 5:29 PM

## 2013-07-26 ENCOUNTER — Other Ambulatory Visit: Payer: Self-pay | Admitting: *Deleted

## 2013-07-26 DIAGNOSIS — I739 Peripheral vascular disease, unspecified: Secondary | ICD-10-CM

## 2013-10-20 ENCOUNTER — Encounter: Payer: Self-pay | Admitting: Vascular Surgery

## 2013-10-21 ENCOUNTER — Encounter: Payer: Self-pay | Admitting: Vascular Surgery

## 2013-10-21 ENCOUNTER — Ambulatory Visit (HOSPITAL_COMMUNITY)
Admission: RE | Admit: 2013-10-21 | Discharge: 2013-10-21 | Disposition: A | Discharge status: Home / Self Care | Payer: Medicare Other | Source: Ambulatory Visit | Attending: Vascular Surgery | Admitting: Vascular Surgery

## 2013-10-21 ENCOUNTER — Ambulatory Visit (INDEPENDENT_AMBULATORY_CARE_PROVIDER_SITE_OTHER): Payer: Medicare Other | Admitting: Vascular Surgery

## 2013-10-21 VITALS — BP 137/67 | HR 77 | Ht 70.0 in | Wt 233.4 lb

## 2013-10-21 DIAGNOSIS — L98499 Non-pressure chronic ulcer of skin of other sites with unspecified severity: Principal | ICD-10-CM

## 2013-10-21 DIAGNOSIS — I739 Peripheral vascular disease, unspecified: Secondary | ICD-10-CM

## 2013-10-21 NOTE — Progress Notes (Signed)
    Established Critical Limb Ischemia Patient  History of Present Illness  [name] [name] is a 80 y.o. (03-Jun-1926) male who presents with chief complaint: bilateral foot wounds.  The patient is getting bilateral ulna boots? with Dr. Sharol Given.  The patient has no rest pain and wounds include: healed ulcers.  The patient notes symptoms have not progressed.  The patient's treatment regimen currently included: maximal medical management and unna boots weekly.  The patient's PMH, PSH, SH, FamHx, Med, and Allergies are unchanged from 07/22/13.  On ROS today: no intermittent claudication , no rest pain, no open ulcers  Physical Examination  Filed Vitals:   10/21/13 1549  BP: 137/67  Pulse: 77  Height: 5\' 10"  (1.778 m)  Weight: 233 lb 6.4 oz (105.87 kg)  SpO2: 98%   Body mass index is 33.49 kg/(m^2).  General: A&O x 3, WDWN  Eyes: PERRLA, EOMI  Pulmonary: Sym exp, good air movt, CTAB, no rales, rhonchi, & wheezing  Cardiac: RRR, Nl S1, S2, no Murmurs, rubs or gallops  Vascular: Vessel Right Left  Radial Palpable Palpable  Brachial Palpable Palpable  Carotid Palpable, without bruit Palpable, without bruit  Aorta Not palpable N/A  Femoral Palpable Palpable  Popliteal Not palpable Not palpable  PT Not Palpable Not Palpable  DP Not Palpable Not Palpable   Gastrointestinal: soft, NTND, -G/R, - HSM, - masses, - CVAT B  Musculoskeletal: M/S 5/5 throughout , Extremities without ischemic changes except small area of skin breakdown on L dorsum of foot  Neurologic: Pain and light touch intact in extremities , Motor exam as listed above  Non-Invasive Vascular Imaging ABI (Date: 10/21/2013)  R: 0.73 (0.41), DP: mono, PT: mono, TBI: 0.48  L: 0.69 (0.57), DP: mono, PT: mono, TBI: 0.21  Medical Decision Making  [name] [name] is a 80 y.o. male who presents with: B critical limb ischemia with mostly healed wounds.   As this patient is wheelchair bound, I doubt he will develop intermittent  claudication.  This patient's dementia also limits the extent of his understanding of his medical condition.  I suspect his ABI are likely falsely elevated.  After compression therapy, the patient's leg edema is decreased and his PT arteries can be found for today's exam.  This represents the entire difference from the ABI last visit's.  Based on the patient's vascular studies and examination, I have offered the patient: q3 month ABI.  I discussed in depth with the patient the nature of atherosclerosis, and emphasized the importance of maximal medical management including strict control of blood pressure, blood glucose, and lipid levels, antiplatelet agents, obtaining regular exercise, and cessation of smoking.    The patient is aware that without maximal medical management the underlying atherosclerotic disease process will progress, limiting the benefit of any interventions. The patient is currently not on a statin.  The patient is also currently not on an anti-platelet.  We will discuss both these issues at his next appointment per his preference.  Thank you for allowing [place] to participate in this patient's care.  Adele Barthel, MD [facility] Office: [phone] Pager: [phone]  10/21/2013, 4:16 PM

## 2013-11-23 ENCOUNTER — Encounter: Payer: Self-pay | Admitting: Podiatry

## 2013-11-23 ENCOUNTER — Ambulatory Visit (INDEPENDENT_AMBULATORY_CARE_PROVIDER_SITE_OTHER): Payer: Medicare Other | Admitting: Podiatry

## 2013-11-23 VITALS — BP 156/86 | HR 74 | Resp 12

## 2013-11-23 DIAGNOSIS — M79676 Pain in unspecified toe(s): Secondary | ICD-10-CM

## 2013-11-23 DIAGNOSIS — I739 Peripheral vascular disease, unspecified: Secondary | ICD-10-CM

## 2013-11-23 DIAGNOSIS — M79609 Pain in unspecified limb: Secondary | ICD-10-CM

## 2013-11-23 DIAGNOSIS — B351 Tinea unguium: Secondary | ICD-10-CM

## 2013-11-23 DIAGNOSIS — L98499 Non-pressure chronic ulcer of skin of other sites with unspecified severity: Secondary | ICD-10-CM

## 2013-11-23 NOTE — Patient Instructions (Signed)
Diabetes and Foot Care Diabetes may cause you to have problems because of poor blood supply (circulation) to your feet and legs. This may cause the skin on your feet to become thinner, break easier, and heal more slowly. Your skin may become dry, and the skin may peel and crack. You may also have nerve damage in your legs and feet causing decreased feeling in them. You may not notice minor injuries to your feet that could lead to infections or more serious problems. Taking care of your feet is one of the most important things you can do for yourself.  HOME CARE INSTRUCTIONS  Wear shoes at all times, even in the house. Do not go barefoot. Bare feet are easily injured.  Check your feet daily for blisters, cuts, and redness. If you cannot see the bottom of your feet, use a mirror or ask someone for help.  Wash your feet with warm water (do not use hot water) and mild soap. Then pat your feet and the areas between your toes until they are completely dry. Do not soak your feet as this can dry your skin.  Apply a moisturizing lotion or petroleum jelly (that does not contain alcohol and is unscented) to the skin on your feet and to dry, brittle toenails. Do not apply lotion between your toes.  Trim your toenails straight across. Do not dig under them or around the cuticle. File the edges of your nails with an emery board or nail file.  Do not cut corns or calluses or try to remove them with medicine.  Wear clean socks or stockings every day. Make sure they are not too tight. Do not wear knee-high stockings since they may decrease blood flow to your legs.  Wear shoes that fit properly and have enough cushioning. To break in new shoes, wear them for just a few hours a day. This prevents you from injuring your feet. Always look in your shoes before you put them on to be sure there are no objects inside.  Do not cross your legs. This may decrease the blood flow to your feet.  If you find a minor scrape,  cut, or break in the skin on your feet, keep it and the skin around it clean and dry. These areas may be cleansed with mild soap and water. Do not cleanse the area with peroxide, alcohol, or iodine.  When you remove an adhesive bandage, be sure not to damage the skin around it.  If you have a wound, look at it several times a day to make sure it is healing.  Do not use heating pads or hot water bottles. They may burn your skin. If you have lost feeling in your feet or legs, you may not know it is happening until it is too late.  Make sure your health care provider performs a complete foot exam at least annually or more often if you have foot problems. Report any cuts, sores, or bruises to your health care provider immediately. SEEK MEDICAL CARE IF:   You have an injury that is not healing.  You have cuts or breaks in the skin.  You have an ingrown nail.  You notice redness on your legs or feet.  You feel burning or tingling in your legs or feet.  You have pain or cramps in your legs and feet.  Your legs or feet are numb.  Your feet always feel cold. SEEK IMMEDIATE MEDICAL CARE IF:   There is increasing redness,   swelling, or pain in or around a wound.  There is a red line that goes up your leg.  Pus is coming from a wound.  You develop a fever or as directed by your health care provider.  You notice a bad smell coming from an ulcer or wound. Document Released: 04/11/2000 Document Revised: 12/15/2012 Document Reviewed: 09/21/2012 Exit[employer] Patient Information 2015 [employer]. This information is not intended to replace advice given to you by your health care provider. Make sure you discuss any questions you have with your health care provider.  

## 2013-11-23 NOTE — Progress Notes (Signed)
   Subjective:    Patient ID: [name] [name], male    DOB: 22-Nov-1926, 80 y.o.   MRN: [id number]  HPI   N- UNCOMFORTABLE   L- B/L    O-SLOWLY   C-LONGER, THICKER   A- SHOES   T- TRIED TO KEEP THEM TRIM.        Review of Systems  Musculoskeletal: Positive for gait problem.  All other systems reviewed and are negative.      Objective:   Physical Exam  Orientated x3 black male  Vascular: DP and PT pulses 0/4 bilaterally Edema distal feet bilaterally  Neurological:  sensation to 10 g monofilament wire 5/5 bilaterally Vibratory sensation nonreactive bilaterally Ankle reflexes weakly reactive bilaterally  Dermatological: Coflex dressings lower extremities extending to the forefoot area bilaterally The toenails are elongated, incurvated, discolored, hypertrophied 6-10  Musculoskeletal: The patient has unsteady gait requiring a roller walker No deformities noted       Assessment & Plan:   Assessment: Peripheral arterial disease bilaterally Edema bilaterally Onychomycoses with symptoms 6-10  Plan: Debrided toenails x10 without any bleeding  Reappoint x3 months

## 2013-11-24 ENCOUNTER — Encounter: Payer: Self-pay | Admitting: Podiatry

## 2014-01-19 ENCOUNTER — Encounter: Payer: Self-pay | Admitting: Family

## 2014-01-20 ENCOUNTER — Ambulatory Visit: Payer: Medicare Other | Admitting: Family

## 2014-02-01 ENCOUNTER — Encounter: Payer: Self-pay | Admitting: Family

## 2014-02-02 ENCOUNTER — Encounter: Payer: Self-pay | Admitting: Family

## 2014-02-02 ENCOUNTER — Ambulatory Visit (INDEPENDENT_AMBULATORY_CARE_PROVIDER_SITE_OTHER): Payer: Medicare Other | Admitting: Family

## 2014-02-02 VITALS — BP 151/87 | HR 88 | Resp 16 | Ht 70.0 in | Wt 216.0 lb

## 2014-02-02 DIAGNOSIS — I739 Peripheral vascular disease, unspecified: Secondary | ICD-10-CM

## 2014-02-02 DIAGNOSIS — L98499 Non-pressure chronic ulcer of skin of other sites with unspecified severity: Secondary | ICD-10-CM

## 2014-02-02 NOTE — Patient Instructions (Signed)
Peripheral Vascular Disease Peripheral Vascular Disease (PVD), also called Peripheral Arterial Disease (PAD), is a circulation problem caused by cholesterol (atherosclerotic plaque) deposits in the arteries. PVD commonly occurs in the lower extremities (legs) but it can occur in other areas of the body, such as your arms. The cholesterol buildup in the arteries reduces blood flow which can cause pain and other serious problems. The presence of PVD can place a person at risk for Coronary Artery Disease (CAD).  CAUSES  Causes of PVD can be many. It is usually associated with more than one risk factor such as:   High Cholesterol.  Smoking.  Diabetes.  Lack of exercise or inactivity.  High blood pressure (hypertension).  Obesity.  Family history. SYMPTOMS   When the lower extremities are affected, patients with PVD may experience:  Leg pain with exertion or physical activity. This is called INTERMITTENT CLAUDICATION. This may present as cramping or numbness with physical activity. The location of the pain is associated with the level of blockage. For example, blockage at the abdominal level (distal abdominal aorta) may result in buttock or hip pain. Lower leg arterial blockage may result in calf pain.  As PVD becomes more severe, pain can develop with less physical activity.  In people with severe PVD, leg pain may occur at rest.  Other PVD signs and symptoms:  Leg numbness or weakness.  Coldness in the affected leg or foot, especially when compared to the other leg.  A change in leg color.  Patients with significant PVD are more prone to ulcers or sores on toes, feet or legs. These may take longer to heal or may reoccur. The ulcers or sores can become infected.  If signs and symptoms of PVD are ignored, gangrene may occur. This can result in the loss of toes or loss of an entire limb.  Not all leg pain is related to PVD. Other medical conditions can cause leg pain such  as:  Blood clots (embolism) or Deep Vein Thrombosis.  Inflammation of the blood vessels (vasculitis).  Spinal stenosis. DIAGNOSIS  Diagnosis of PVD can involve several different types of tests. These can include:  Pulse Volume Recording Method (PVR). This test is simple, painless and does not involve the use of X-rays. PVR involves measuring and comparing the blood pressure in the arms and legs. An ABI (Ankle-Brachial Index) is calculated. The normal ratio of blood pressures is 1. As this number becomes smaller, it indicates more severe disease.  < 0.95 - indicates significant narrowing in one or more leg vessels.  <0.8 - there will usually be pain in the foot, leg or buttock with exercise.  <0.4 - will usually have pain in the legs at rest.  <0.25 - usually indicates limb threatening PVD.  Doppler detection of pulses in the legs. This test is painless and checks to see if you have a pulses in your legs/feet.  A dye or contrast material (a substance that highlights the blood vessels so they show up on x-ray) may be given to help your caregiver better see the arteries for the following tests. The dye is eliminated from your body by the kidney's. Your caregiver may order blood work to check your kidney function and other laboratory values before the following tests are performed:  Magnetic Resonance Angiography (MRA). An MRA is a picture study of the blood vessels and arteries. The MRA machine uses a large magnet to produce images of the blood vessels.  Computed Tomography Angiography (CTA). A CTA   is a specialized x-ray that looks at how the blood flows in your blood vessels. An IV may be inserted into your arm so contrast dye can be injected.  Angiogram. Is a procedure that uses x-rays to look at your blood vessels. This procedure is minimally invasive, meaning a small incision (cut) is made in your groin. A small tube (catheter) is then inserted into the artery of your groin. The catheter  is guided to the blood vessel or artery your caregiver wants to examine. Contrast dye is injected into the catheter. X-rays are then taken of the blood vessel or artery. After the images are obtained, the catheter is taken out. TREATMENT  Treatment of PVD involves many interventions which may include:  Lifestyle changes:  Quitting smoking.  Exercise.  Following a low fat, low cholesterol diet.  Control of diabetes.  Foot care is very important to the PVD patient. Good foot care can help prevent infection.  Medication:  Cholesterol-lowering medicine.  Blood pressure medicine.  Anti-platelet drugs.  Certain medicines may reduce symptoms of Intermittent Claudication.  Interventional/Surgical options:  Angioplasty. An Angioplasty is a procedure that inflates a balloon in the blocked artery. This opens the blocked artery to improve blood flow.  Stent Implant. A wire mesh tube (stent) is placed in the artery. The stent expands and stays in place, allowing the artery to remain open.  Peripheral Bypass Surgery. This is a surgical procedure that reroutes the blood around a blocked artery to help improve blood flow. This type of procedure may be performed if Angioplasty or stent implants are not an option. SEEK IMMEDIATE MEDICAL CARE IF:   You develop pain or numbness in your arms or legs.  Your arm or leg turns cold, becomes blue in color.  You develop redness, warmth, swelling and pain in your arms or legs. MAKE SURE YOU:   Understand these instructions.  Will watch your condition.  Will get help right away if you are not doing well or get worse. Document Released: 05/22/2004 Document Revised: 07/07/2011 Document Reviewed: 04/18/2008 ExitCare Patient Information 2015 [employer]. This information is not intended to replace advice given to you by your health care provider. Make sure you discuss any questions you have with your health care provider.   Venous Stasis or  Chronic Venous Insufficiency Chronic venous insufficiency, also called venous stasis, is a condition that affects the veins in the legs. The condition prevents blood from being pumped through these veins effectively. Blood may no longer be pumped effectively from the legs back to the heart. This condition can range from mild to severe. With proper treatment, you should be able to continue with an active life. CAUSES  Chronic venous insufficiency occurs when the vein walls become stretched, weakened, or damaged or when valves within the vein are damaged. Some common causes of this include:  High blood pressure inside the veins (venous hypertension).  Increased blood pressure in the leg veins from long periods of sitting or standing.  A blood clot that blocks blood flow in a vein (deep vein thrombosis).  Inflammation of a superficial vein (phlebitis) that causes a blood clot to form. RISK FACTORS Various things can make you more likely to develop chronic venous insufficiency, including:  Family history of this condition.  Obesity.  Pregnancy.  Sedentary lifestyle.  Smoking.  Jobs requiring long periods of standing or sitting in one place.  Being a certain age. Women in their 40s and 50s and men in their 70s are more   likely to develop this condition. SIGNS AND SYMPTOMS  Symptoms may include:   Varicose veins.  Skin breakdown or ulcers.  Reddened or discolored skin on the leg.  Brown, smooth, tight, and painful skin just above the ankle, usually on the inside surface (lipodermatosclerosis).  Swelling. DIAGNOSIS  To diagnose this condition, your health care provider will take a medical history and do a physical exam. The following tests may be ordered to confirm the diagnosis:  Duplex ultrasound--A procedure that produces a picture of a blood vessel and nearby organs and also provides information on blood flow through the blood vessel.  Plethysmography--A procedure that tests  blood flow.  A venogram, or venography--A procedure used to look at the veins using X-ray and dye. TREATMENT The goals of treatment are to help you return to an active life and to minimize pain or disability. Treatment will depend on the severity of the condition. Medical procedures may be needed for severe cases. Treatment options may include:   Use of compression stockings. These can help with symptoms and lower the chances of the problem getting worse, but they do not cure the problem.  Sclerotherapy--A procedure involving an injection of a material that "dissolves" the damaged veins. Other veins in the network of blood vessels take over the function of the damaged veins.  Surgery to remove the vein or cut off blood flow through the vein (vein stripping or laser ablation surgery).  Surgery to repair a valve. HOME CARE INSTRUCTIONS   Wear compression stockings as directed by your health care provider.  Only take over-the-counter or prescription medicines for pain, discomfort, or fever as directed by your health care provider.  Follow up with your health care provider as directed. SEEK MEDICAL CARE IF:   You have redness, swelling, or increasing pain in the affected area.  You see a red streak or line that extends up or down from the affected area.  You have a breakdown or loss of skin in the affected area, even if the breakdown is small.  You have an injury to the affected area. SEEK IMMEDIATE MEDICAL CARE IF:   You have an injury and open wound in the affected area.  Your pain is severe and does not improve with medicine.  You have sudden numbness or weakness in the foot or ankle below the affected area, or you have trouble moving your foot or ankle.  You have a fever or persistent symptoms for more than 2-3 days.  You have a fever and your symptoms suddenly get worse. MAKE SURE YOU:   Understand these instructions.  Will watch your condition.  Will get help right  away if you are not doing well or get worse. Document Released: 08/18/2006 Document Revised: 02/02/2013 Document Reviewed: 12/20/2012 ExitCare Patient Information 2015 [employer]. This information is not intended to replace advice given to you by your health care provider. Make sure you discuss any questions you have with your health care provider.  

## 2014-02-02 NOTE — Progress Notes (Signed)
[facility] [place] HISTORY AND PHYSICAL -PAD  History of Present Illness [name] [name] is a 80 y.o. male patient of Dr. Bridgett Larsson who presents with chief complaint: bilateral foot wounds which have healed. The patient was getting bilateral ulna boots with Dr. Sharol Given. The patient has no rest pain and wounds include: healed ulcers. The patient notes symptoms have not progressed. The patient's treatment regimen in the past included: maximal medical management and unna boots weekly. The unna boots were stopped a couple of months ago and he has been using graduated knee high stockings and he states he has had no ulcers and minimal swelling in his lower legs. He has a scaly thick rash all along the lateral aspect of his left leg, dorsal aspect of left forearm, and both sides of face and scalp.   Pt Diabetic: Yes Pt smoker: former smoker  Pt meds include: Statin :No ASA: No Other anticoagulants/antiplatelets: no  Past Medical History  Diagnosis Date  . Hypertension   . Lymphadenitis 08/10/2012  . Gout   . Chronic kidney disease     RENAL INSUFICCIENCY  . Diabetes mellitus without complication   . DVT (deep venous thrombosis)     Social History History  Substance Use Topics  . Smoking status: Former Smoker -- 40 years    Types: Pipe  . Smokeless tobacco: Never Used  . Alcohol Use: No    Family History No family history on file.  No past surgical history on file.  No Known Allergies  Current Outpatient Prescriptions  Medication Sig Dispense Refill  . allopurinol (ZYLOPRIM) 100 MG tablet Take 300 mg by mouth daily.       Marland Kitchen allopurinol (ZYLOPRIM) 300 MG tablet       . amoxicillin (AMOXIL) 500 MG capsule Take 1 capsule (500 mg total) by mouth 3 (three) times daily.  30 capsule  0  . clobetasol cream (TEMOVATE) 0.05 %       . furosemide (LASIX) 40 MG tablet       . glimepiride (AMARYL) 1 MG tablet       . glimepiride (AMARYL) 2 MG tablet Take 2 mg by mouth daily  before breakfast.      . ketoconazole (NIZORAL) 2 % cream Apply 1 application topically as needed (for dry skin).      Marland Kitchen losartan (COZAAR) 25 MG tablet       . traMADol (ULTRAM) 50 MG tablet       . verapamil (CALAN-SR) 240 MG CR tablet Take 240 mg by mouth daily.       Marland Kitchen doxycycline (VIBRAMYCIN) 100 MG capsule Take 1 capsule (100 mg total) by mouth 2 (two) times daily.  20 capsule  0   No current facility-administered medications for this visit.    ROS: See HPI for pertinent positives and negatives.   Physical Examination  Filed Vitals:   02/02/14 1634  BP: 151/87  Pulse: 88  Resp: 16  Height: 5\' 10"  (1.778 m)  Weight: 216 lb (97.977 kg)   Body mass index is 30.99 kg/(m^2).  General: A&O x 3, WDWN , obese male Eyes: PERRLA Pulmonary: Sym exp, good air movt, CTAB, no rales, rhonchi, & wheezing  Cardiac: RRR, Nl S1, S2, no detected Murmur Vascular:  Vessel  Right  Left   Radial  Palpable  Palpable   Brachial  Palpable  Palpable   Carotid  Palpable, without bruit  Palpable, without bruit   Aorta  Not palpable  N/A  Femoral  Palpable  Palpable   Popliteal  Not palpable  Not palpable   PT  Not Palpable, monophasic by Doppler  Not Palpable,monophasic by Doppler   DP  Not Palpable, monophasic by Doppler  Not Palpable, monophasic by Doppler    Gastrointestinal: soft, NTND, -G/R, - HSM, - masses palpated, - CVAT B  Musculoskeletal: M/S 5/5 throughout , Extremities without ischemic changes. He has a scaly thick rash all along the lateral aspect of his left leg, dorsal aspect of left forearm, and both sides of face and scalp. Trace bilateral lower legs pitting edema. Neurologic: Pain and light touch intact in extremities , Motor exam as listed above   ASSESSMENT: [name] [name] is a 80 y.o. male who presents with moderate PAD, monophasic waveforms on ABI's 3 months ago, audible DT and PT pulses with Doppler today. Lower leg ulcers are healed, no new lesions. He remains with the  scaly thick lesions as in HPI. ABI's not done today as they were apparently not scheduled. Will discuss antiplatelet use on his return in 3 months. He is using knee high graduated stockings for the last couple of months since unna boot stopped, and he has has no return of stasis ulcers, and minimal edema, per pt.  PLAN:   Continue knee high compression hose, elevate feet above heart when not walking or exercising legs.   I discussed in depth with the patient the nature of atherosclerosis, and emphasized the importance of maximal medical management including strict control of blood pressure, blood glucose, and lipid levels, obtaining regular exercise, and continued cessation of smoking.  The patient is aware that without maximal medical management the underlying atherosclerotic disease process will progress, limiting the benefit of any interventions.  Based on the patient's vascular studies and examination, pt will return to clinic in 3 months with ABI's and see Dr. Bridgett Larsson for evaluation, following visits may return to see NP as per Dr. Bridgett Larsson.  The patient was given information about PAD and venous stasis, including signs, symptoms, treatment, what symptoms should prompt the patient to seek immediate medical care, and risk reduction measures to take.      Clemon Chambers, RN, MSN, FNP-C [facility] [employer] [facility] Phone: [phone]  Clinic MD: [facility]  02/02/2014 4:42 PM

## 2014-02-19 IMAGING — CR DG PELVIS 1-2V
1 series · 1 of 1 positions shown · non-contrast
Comparison: None available.

CLINICAL DATA: Fall.  Bilateral lower extremity pain.  Left hip
pain.

PELVIS - 1-2 VIEW

[t pelvis a.p.]
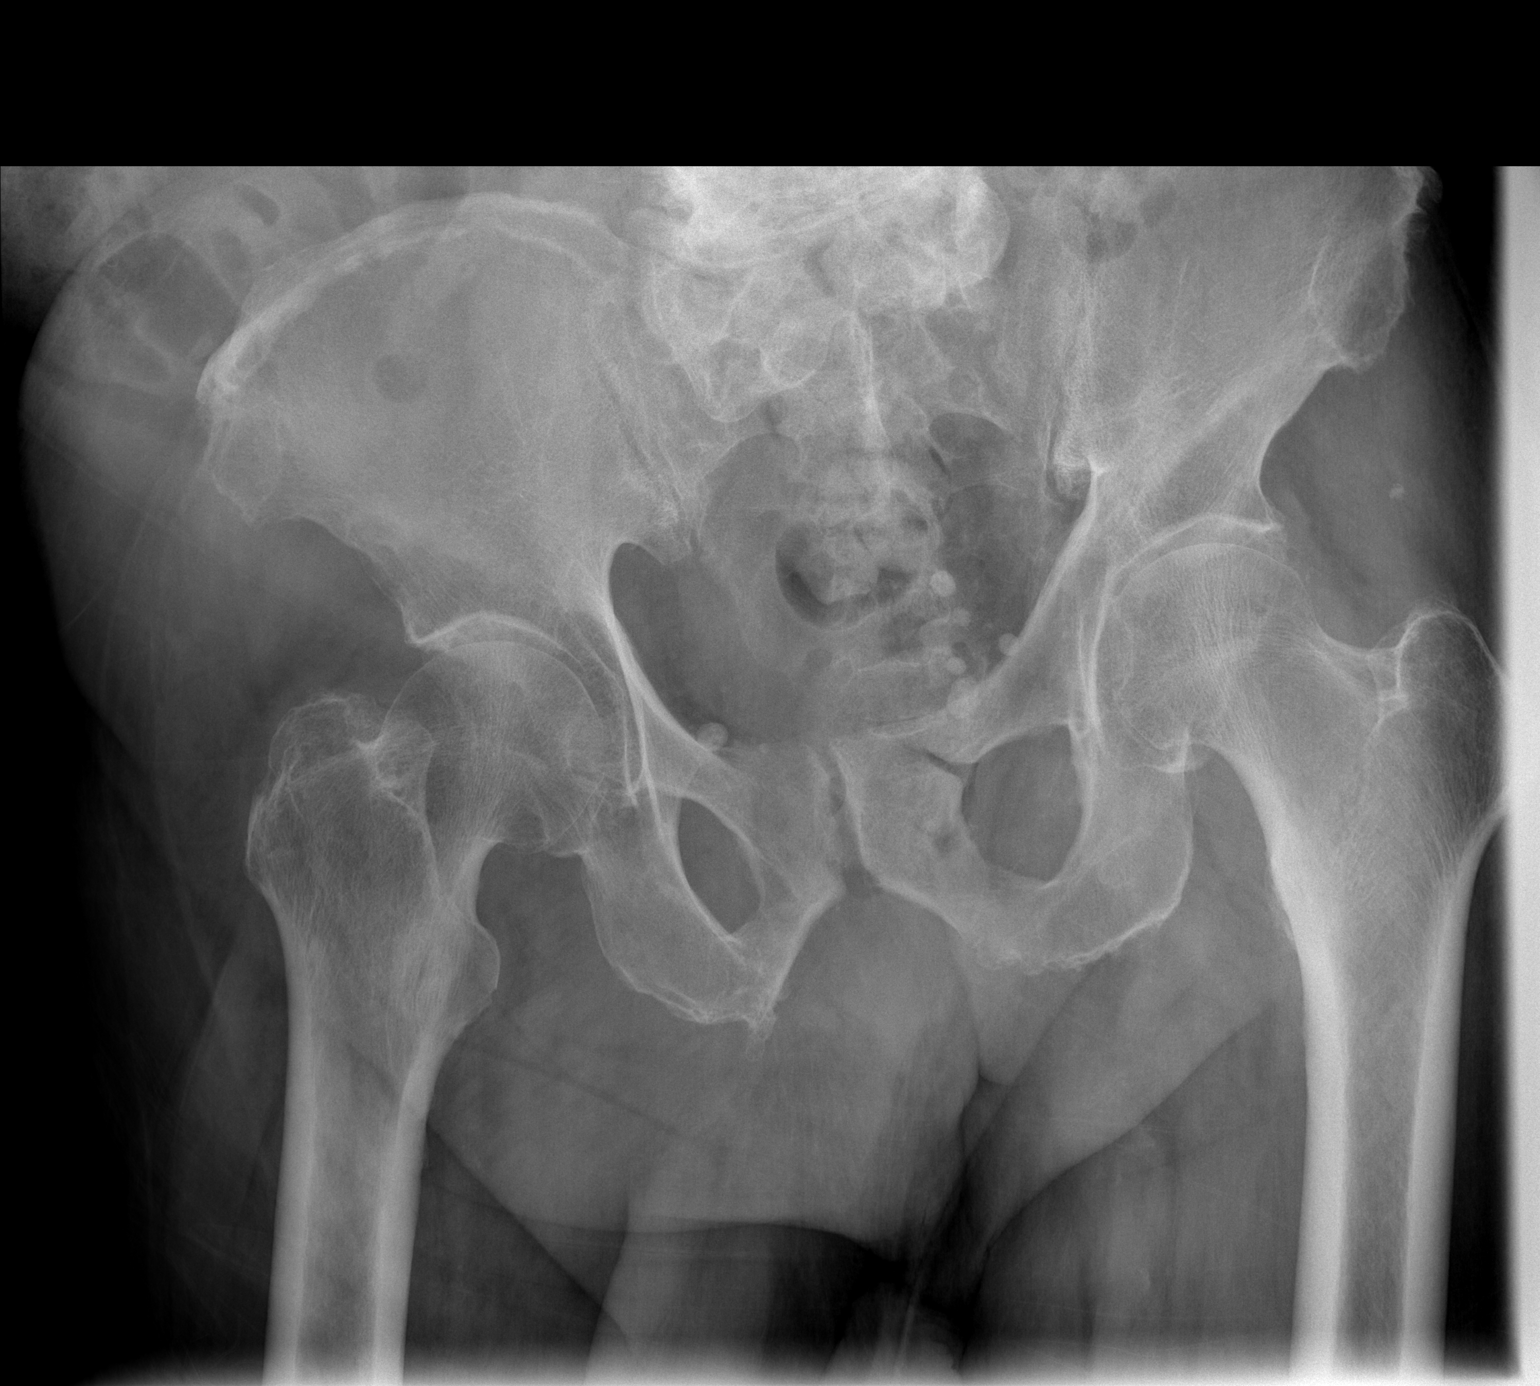

[1 of 1 positions shown; findings below may reference images not displayed]

FINDINGS: A lucency over the left superior.  The ramus is related
to overlying soft tissues.  No acute fracture is present.
Degenerative changes are noted in the lower lumbar spine and
bilateral SI joint.  Mild degenerative changes are present in the
hips.  Densities within the anatomic pelvis likely represent
phleboliths.
IMPRESSION: 1.  No acute abnormality.
2.  Degenerative changes in the lumbar spine, SI joints, and hips.

## 2014-02-19 IMAGING — CR DG TIBIA/FIBULA 2V*L*
4 series · 4 of 4 positions shown · non-contrast
Comparison: Left tibia and fibula radiographs 08/05/2012.

CLINICAL DATA: Fall.  Left-sided pain.  Question osteomyelitis.

LEFT TIBIA AND FIBULA - 2 VIEW

[t tib/fib ap left (1 of 2)]
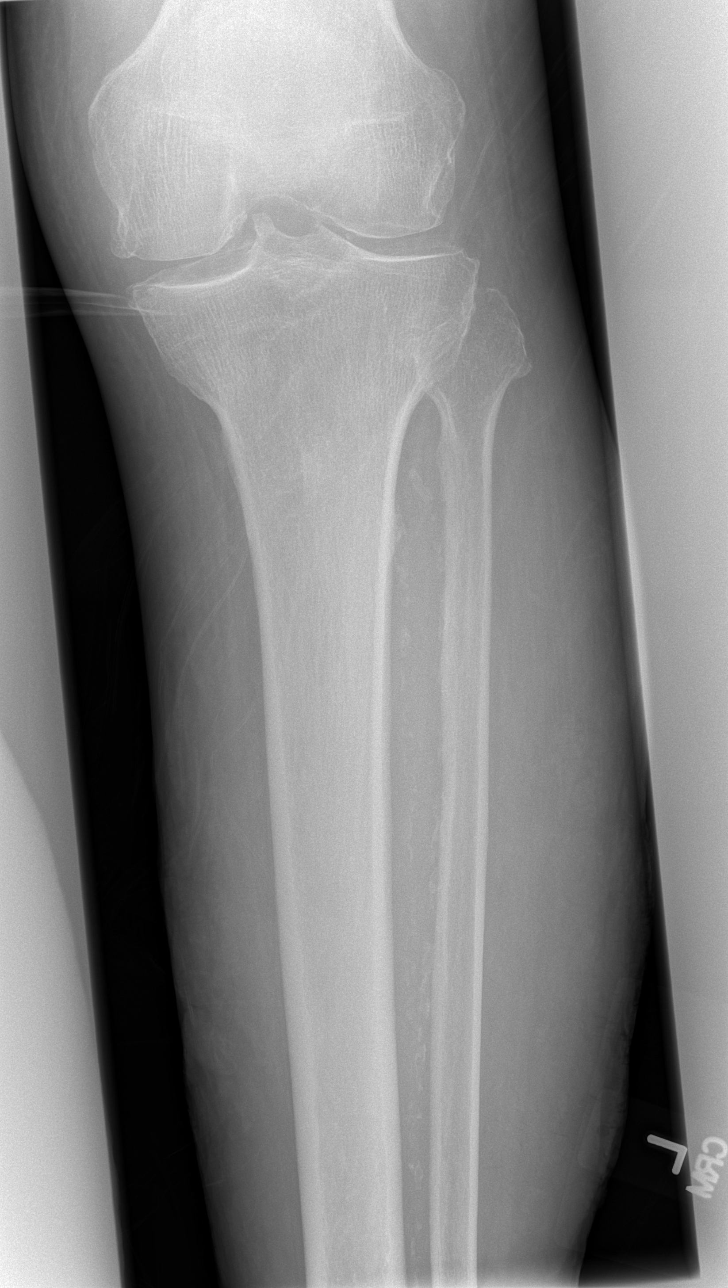

[t tib/fib ap left (2 of 2)]
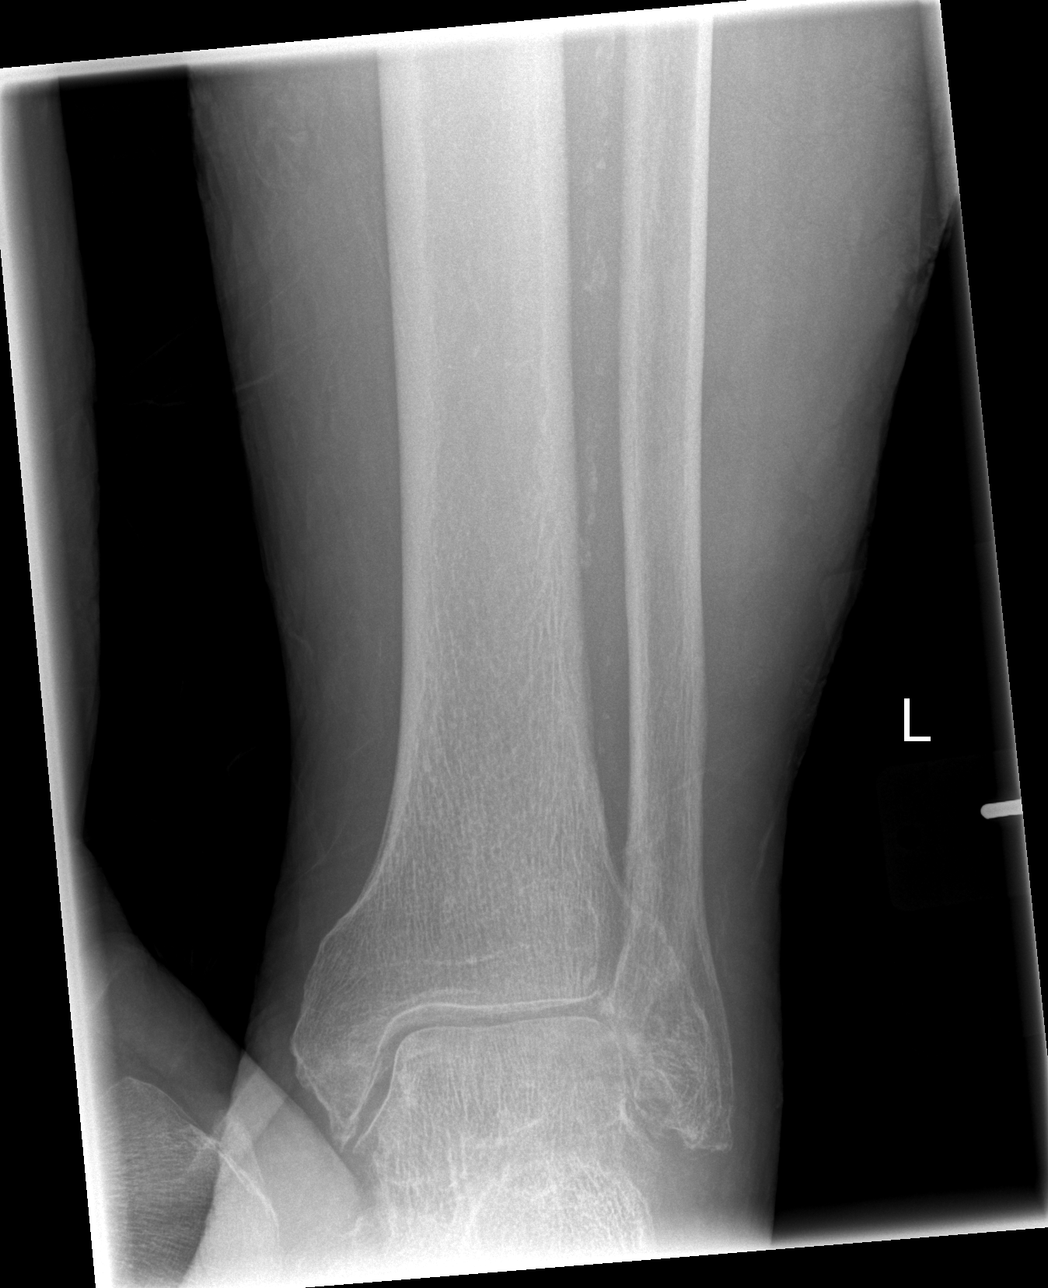

[t tib/fib lat left * (1 of 2)]
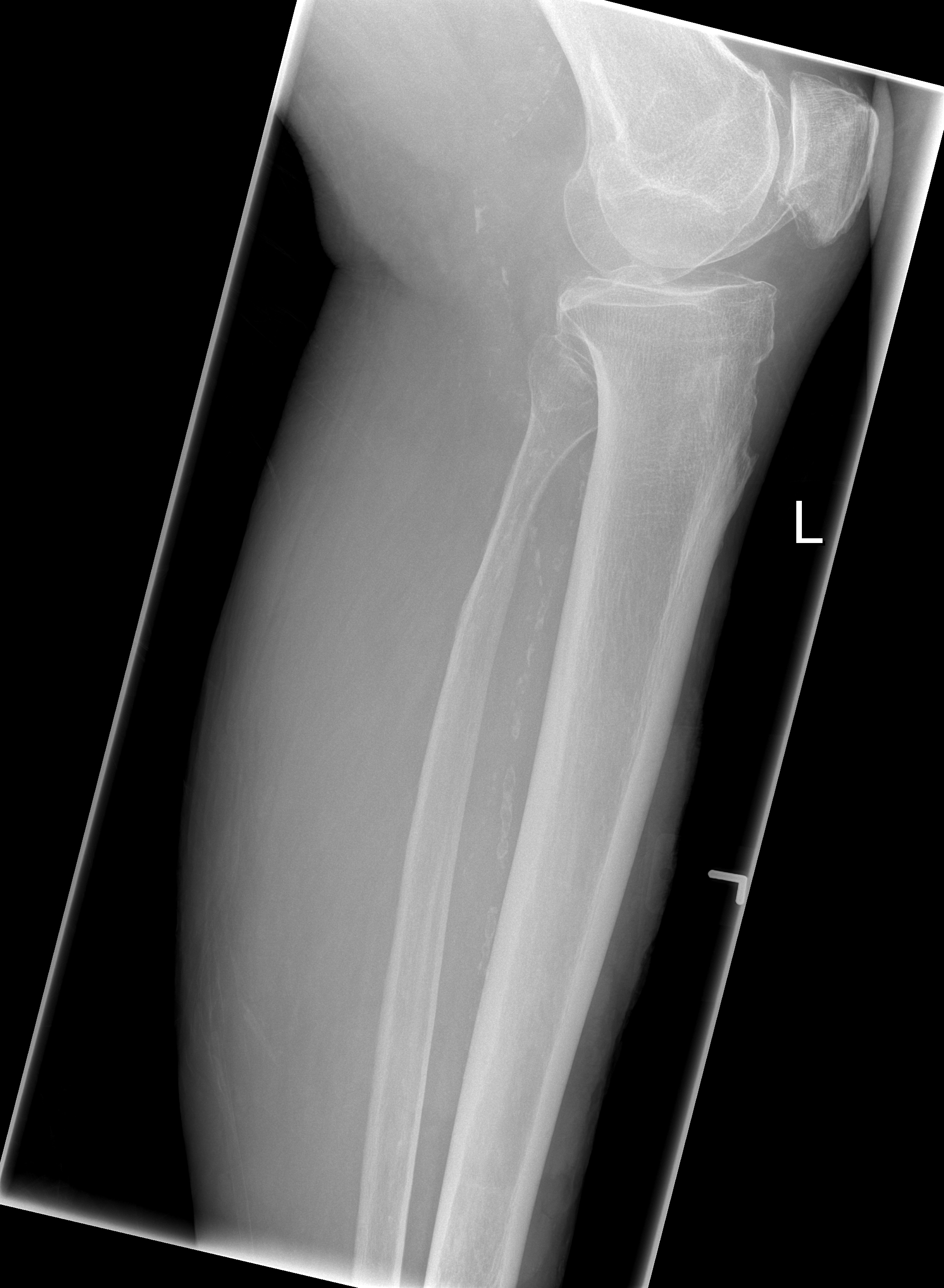

[t tib/fib lat left * (2 of 2)]
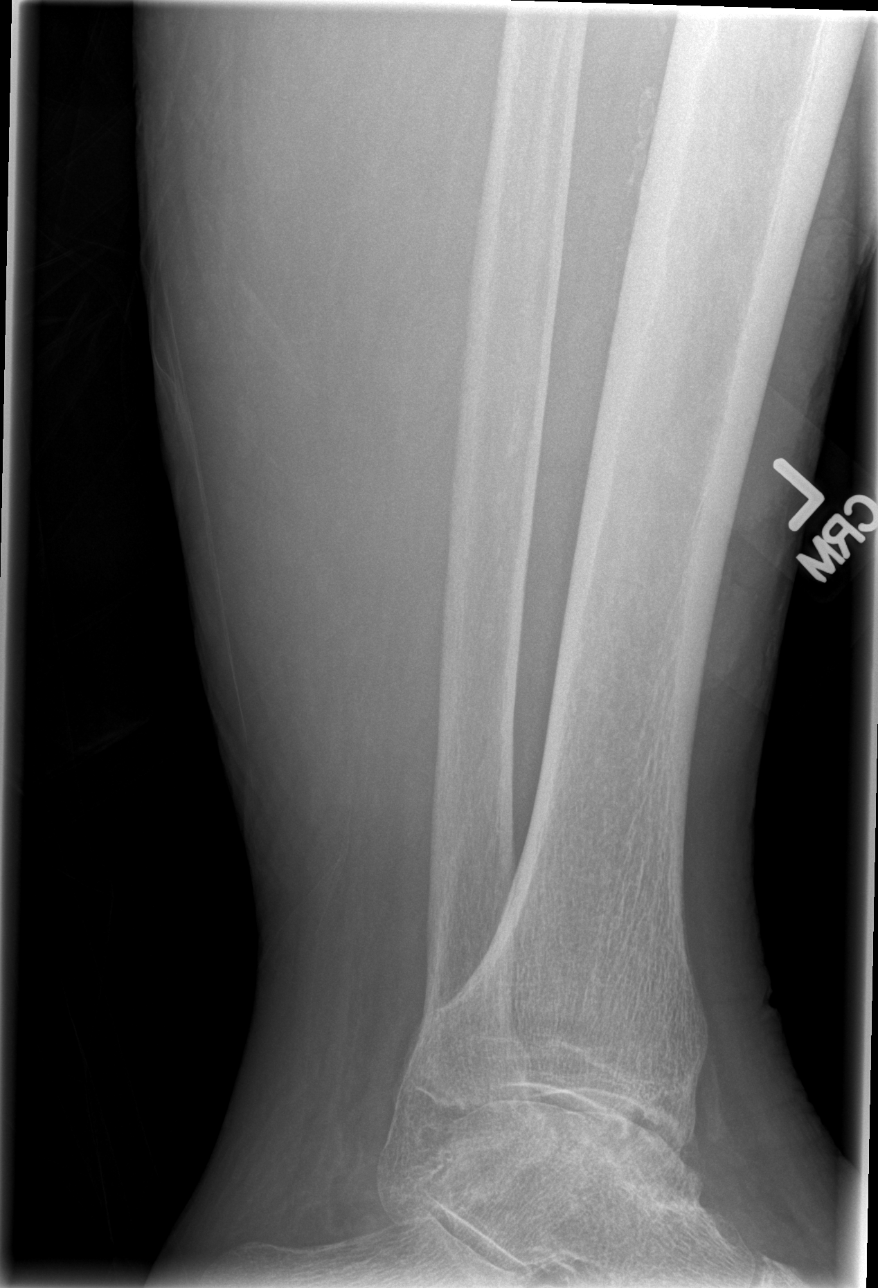

[4 of 4 positions shown; findings below may reference images not displayed]

FINDINGS: Degenerative changes are again noted at the knee and
ankle.  Atherosclerotic changes are present.  Edematous changes in
the lower extremity decreased.  No acute osseous abnormality is
present.
IMPRESSION: 1.  Decreasing edematous changes in the lower extremity.
2.  Degenerative changes of the knee and ankle.
3.  Atherosclerosis.

## 2014-02-27 ENCOUNTER — Ambulatory Visit (INDEPENDENT_AMBULATORY_CARE_PROVIDER_SITE_OTHER): Payer: Medicare Other | Admitting: Podiatry

## 2014-02-27 DIAGNOSIS — M79676 Pain in unspecified toe(s): Secondary | ICD-10-CM

## 2014-02-27 DIAGNOSIS — B351 Tinea unguium: Secondary | ICD-10-CM

## 2014-02-28 NOTE — Progress Notes (Signed)
Patient ID: [name] [name], male   DOB: 09/18/1926, 80 y.o.   MRN: [id number]  Subjective: This patient presents today again complaining of painful toenails and requested toenail debridement  Objective: The toenails are elongated, incurvated, hypertrophic, discolored 6-10  Assessment: Symptomatic onychomycosis 6-10  Plan: Nails 10 are debrided without any bleeding  Reappoint 3 months

## 2014-04-13 ENCOUNTER — Other Ambulatory Visit: Payer: Self-pay | Admitting: *Deleted

## 2014-04-13 DIAGNOSIS — I739 Peripheral vascular disease, unspecified: Secondary | ICD-10-CM

## 2014-05-04 ENCOUNTER — Encounter: Payer: Self-pay | Admitting: Vascular Surgery

## 2014-05-05 ENCOUNTER — Ambulatory Visit (INDEPENDENT_AMBULATORY_CARE_PROVIDER_SITE_OTHER): Payer: Medicare Other | Admitting: Vascular Surgery

## 2014-05-05 ENCOUNTER — Encounter: Payer: Self-pay | Admitting: Vascular Surgery

## 2014-05-05 ENCOUNTER — Ambulatory Visit (HOSPITAL_COMMUNITY)
Admission: RE | Admit: 2014-05-05 | Discharge: 2014-05-05 | Disposition: A | Discharge status: Home / Self Care | Payer: Medicare Other | Source: Ambulatory Visit | Attending: Vascular Surgery | Admitting: Vascular Surgery

## 2014-05-05 VITALS — BP 124/60 | HR 80 | Temp 98.2°F | Resp 18 | Ht 70.0 in | Wt 237.5 lb

## 2014-05-05 DIAGNOSIS — L98499 Non-pressure chronic ulcer of skin of other sites with unspecified severity: Secondary | ICD-10-CM

## 2014-05-05 DIAGNOSIS — I739 Peripheral vascular disease, unspecified: Secondary | ICD-10-CM

## 2014-05-05 DIAGNOSIS — I7025 Atherosclerosis of native arteries of other extremities with ulceration: Secondary | ICD-10-CM

## 2014-05-05 NOTE — Progress Notes (Signed)
    Established Critical Limb Ischemia Patient  History of Present Illness  [name] [name] is a 81 y.o. male w/ longstanding VSU  who presents with chief complaint: follow up from bilateral foot wounds.The patient has healed up his wounds at this point.  He denies any rest pain or intermittent claudication as his ambulation is limited.  The patient's treatment regimen currently included: maximal medical management and compression socks.  The patient's PMH, PSH, SH, FamHx, Med, and Allergies are unchanged from 02/02/14  On ROS today: no intermittent claudication , no rest pain, all ulcers healed  Physical Examination  Filed Vitals:   05/05/14 1604  BP: 124/60  Pulse: 80  Temp: 98.2 F (36.8 C)  TempSrc: Oral  Resp: 18  Height: 5\' 10"  (1.778 m)  Weight: 237 lb 8 oz (107.729 kg)  SpO2: 100%   Body mass index is 34.08 kg/(m^2).   General: A&O x 3, WDWN  Eyes: PERRLA, EOMI  Pulmonary: Sym exp, good air movt, CTAB, no rales, rhonchi, & wheezing  Cardiac: RRR, Nl S1, S2, no Murmurs, rubs or gallops  Vascular: Vessel Right Left  Radial Palpable Palpable  Brachial Palpable Palpable  Carotid Palpable, without bruit Palpable, without bruit  Aorta Not palpable N/A  Femoral Palpable Palpable  Popliteal Not palpable Not palpable  PT Not Palpable Not Palpable  DP Not Palpable Not Palpable   Gastrointestinal: soft, NTND, -G/R, - HSM, - masses, - CVAT B  Musculoskeletal: M/S 5/5 throughout , extensive skin changes associated with venous insufficiency, BLE LDS, psoriatic plaques on both knees  Neurologic: Pain and light touch intact in extremities , Motor exam as listed above  Non-Invasive Vascular Imaging ABI (05/05/2014)  R: 0.65 (0.73), DP: mono, PT: mono, TBI: 0.57  L: 0.65 (0.69), DP: mono, PT: mono, TBI: 0.36  Medical Decision Making  [name] [name] is a 81 y.o. male  who presents with: BLE moderate PAD, BLE chronic venous insufficiency  (C5)   Again patient's lilmited ambulation will limit any sx, so I doubt he will develop extensive intermittent claudication.   This patient's dementia again limits the extent of his understanding of his medical condition.  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  Based on the patient's vascular studies and examination, I have offered the patient: q6 month ABI.  I discussed in depth with the patient the nature of atherosclerosis, and emphasized the importance of maximal medical management including strict control of blood pressure, blood glucose, and lipid levels, antiplatelet agents, obtaining regular exercise, and cessation of smoking.   The patient is aware that without maximal medical management the underlying atherosclerotic disease process will progress, limiting the benefit of any interventions.  I tried to have the statin/antiplatelet discussion with the patient, but his dementia limits the conversation.  Thank you for allowing [place] to participate in this patient's care.  Adele Barthel, MD [facility] [place] Office: [phone] Pager: [phone]  05/05/2014, 5:07 PM

## 2014-06-05 ENCOUNTER — Ambulatory Visit: Payer: Medicare Other | Admitting: Podiatry

## 2014-10-18 ENCOUNTER — Inpatient Hospital Stay (HOSPITAL_COMMUNITY)
Admission: EM | Admit: 2014-10-18 | Discharge: 2014-10-22 | DRG: 603 | Disposition: A | Discharge status: Home Health | Payer: Medicare Other | Attending: Internal Medicine | Admitting: Internal Medicine

## 2014-10-18 ENCOUNTER — Inpatient Hospital Stay (HOSPITAL_COMMUNITY): Payer: Medicare Other

## 2014-10-18 ENCOUNTER — Emergency Department (HOSPITAL_BASED_OUTPATIENT_CLINIC_OR_DEPARTMENT_OTHER): Payer: Medicare Other

## 2014-10-18 ENCOUNTER — Encounter (HOSPITAL_COMMUNITY): Payer: Self-pay | Admitting: Emergency Medicine

## 2014-10-18 DIAGNOSIS — E1151 Type 2 diabetes mellitus with diabetic peripheral angiopathy without gangrene: Secondary | ICD-10-CM | POA: Diagnosis present

## 2014-10-18 DIAGNOSIS — I5032 Chronic diastolic (congestive) heart failure: Secondary | ICD-10-CM | POA: Diagnosis present

## 2014-10-18 DIAGNOSIS — M79609 Pain in unspecified limb: Secondary | ICD-10-CM | POA: Diagnosis not present

## 2014-10-18 DIAGNOSIS — N183 Chronic kidney disease, stage 3 unspecified: Secondary | ICD-10-CM | POA: Diagnosis present

## 2014-10-18 DIAGNOSIS — Z993 Dependence on wheelchair: Secondary | ICD-10-CM

## 2014-10-18 DIAGNOSIS — R609 Edema, unspecified: Secondary | ICD-10-CM | POA: Diagnosis present

## 2014-10-18 DIAGNOSIS — L03119 Cellulitis of unspecified part of limb: Secondary | ICD-10-CM | POA: Diagnosis present

## 2014-10-18 DIAGNOSIS — E119 Type 2 diabetes mellitus without complications: Secondary | ICD-10-CM | POA: Diagnosis not present

## 2014-10-18 DIAGNOSIS — M109 Gout, unspecified: Secondary | ICD-10-CM | POA: Diagnosis present

## 2014-10-18 DIAGNOSIS — E876 Hypokalemia: Secondary | ICD-10-CM | POA: Diagnosis not present

## 2014-10-18 DIAGNOSIS — I10 Essential (primary) hypertension: Secondary | ICD-10-CM | POA: Diagnosis present

## 2014-10-18 DIAGNOSIS — L03116 Cellulitis of left lower limb: Secondary | ICD-10-CM | POA: Diagnosis present

## 2014-10-18 DIAGNOSIS — L409 Psoriasis, unspecified: Secondary | ICD-10-CM | POA: Diagnosis present

## 2014-10-18 DIAGNOSIS — Z87891 Personal history of nicotine dependence: Secondary | ICD-10-CM

## 2014-10-18 DIAGNOSIS — I129 Hypertensive chronic kidney disease with stage 1 through stage 4 chronic kidney disease, or unspecified chronic kidney disease: Secondary | ICD-10-CM | POA: Diagnosis present

## 2014-10-18 DIAGNOSIS — L03115 Cellulitis of right lower limb: Secondary | ICD-10-CM | POA: Diagnosis not present

## 2014-10-18 DIAGNOSIS — R52 Pain, unspecified: Secondary | ICD-10-CM

## 2014-10-18 DIAGNOSIS — I519 Heart disease, unspecified: Secondary | ICD-10-CM | POA: Diagnosis not present

## 2014-10-18 DIAGNOSIS — E1122 Type 2 diabetes mellitus with diabetic chronic kidney disease: Secondary | ICD-10-CM | POA: Diagnosis present

## 2014-10-18 DIAGNOSIS — L0390 Cellulitis, unspecified: Secondary | ICD-10-CM

## 2014-10-18 LAB — CBC WITH DIFFERENTIAL/PLATELET
BASOS PCT: 0 % (ref 0–1)
Basophils Absolute: 0 10*3/uL (ref 0.0–0.1)
EOS ABS: 0 10*3/uL (ref 0.0–0.7)
Eosinophils Relative: 0 % (ref 0–5)
HEMATOCRIT: 39.2 % (ref 39.0–52.0)
Hemoglobin: 13.1 g/dL (ref 13.0–17.0)
LYMPHS ABS: 0.8 10*3/uL (ref 0.7–4.0)
Lymphocytes Relative: 12 % (ref 12–46)
MCH: 32.4 pg (ref 26.0–34.0)
MCHC: 33.4 g/dL (ref 30.0–36.0)
MCV: 97 fL (ref 78.0–100.0)
MONO ABS: 0.9 10*3/uL (ref 0.1–1.0)
MONOS PCT: 12 % (ref 3–12)
NEUTROS PCT: 76 % (ref 43–77)
Neutro Abs: 5.5 10*3/uL (ref 1.7–7.7)
Platelets: 262 10*3/uL (ref 150–400)
RBC: 4.04 MIL/uL — AB (ref 4.22–5.81)
RDW: 14.4 % (ref 11.5–15.5)
WBC: 7.2 10*3/uL (ref 4.0–10.5)

## 2014-10-18 LAB — COMPREHENSIVE METABOLIC PANEL
ALBUMIN: 3.4 g/dL — AB (ref 3.5–5.0)
ALK PHOS: 58 U/L (ref 38–126)
ALT: 11 U/L — AB (ref 17–63)
AST: 22 U/L (ref 15–41)
Anion gap: 10 (ref 5–15)
BILIRUBIN TOTAL: 1.3 mg/dL — AB (ref 0.3–1.2)
BUN: 29 mg/dL — AB (ref 6–20)
CHLORIDE: 99 mmol/L — AB (ref 101–111)
CO2: 28 mmol/L (ref 22–32)
CREATININE: 1.47 mg/dL — AB (ref 0.61–1.24)
Calcium: 9 mg/dL (ref 8.9–10.3)
GFR calc Af Amer: 48 mL/min — ABNORMAL LOW (ref >60)
GFR, EST NON AFRICAN AMERICAN: 41 mL/min — AB (ref >60)
Glucose, Bld: 137 mg/dL — ABNORMAL HIGH (ref 65–99)
Potassium: 4.1 mmol/L (ref 3.5–5.1)
Sodium: 137 mmol/L (ref 135–145)
Total Protein: 7.8 g/dL (ref 6.5–8.1)

## 2014-10-18 LAB — CBC
HEMATOCRIT: 37 % — AB (ref 39.0–52.0)
Hemoglobin: 12.5 g/dL — ABNORMAL LOW (ref 13.0–17.0)
MCH: 32.1 pg (ref 26.0–34.0)
MCHC: 33.8 g/dL (ref 30.0–36.0)
MCV: 94.9 fL (ref 78.0–100.0)
Platelets: 237 10*3/uL (ref 150–400)
RBC: 3.9 MIL/uL — AB (ref 4.22–5.81)
RDW: 14.3 % (ref 11.5–15.5)
WBC: 8.2 10*3/uL (ref 4.0–10.5)

## 2014-10-18 LAB — I-STAT CG4 LACTIC ACID, ED: Lactic Acid, Venous: 2.02 mmol/L (ref 0.5–2.0)

## 2014-10-18 LAB — SEDIMENTATION RATE: Sed Rate: 85 mm/hr — ABNORMAL HIGH (ref 0–16)

## 2014-10-18 LAB — GLUCOSE, CAPILLARY: Glucose-Capillary: 118 mg/dL — ABNORMAL HIGH (ref 65–99)

## 2014-10-18 MED ORDER — VANCOMYCIN HCL 10 G IV SOLR
2000.0000 mg | INTRAVENOUS | Status: AC
  Administered 2014-10-18: 2000 mg via INTRAVENOUS
  Filled 2014-10-18: qty 2000

## 2014-10-18 MED ORDER — PIPERACILLIN-TAZOBACTAM 3.375 G IVPB 30 MIN
3.3750 g | Freq: Once | INTRAVENOUS | Status: AC
  Administered 2014-10-18: 3.375 g via INTRAVENOUS
  Filled 2014-10-18: qty 50

## 2014-10-18 MED ORDER — ONDANSETRON HCL 4 MG PO TABS: 4.0000 mg | ORAL_TABLET | Freq: Four times a day (QID) | ORAL | Status: DC | PRN

## 2014-10-18 MED ORDER — FUROSEMIDE 10 MG/ML IJ SOLN
40.0000 mg | Freq: Every day | INTRAMUSCULAR | Status: DC
  Administered 2014-10-19 – 2014-10-20 (×3): 40 mg via INTRAVENOUS
  Filled 2014-10-18 (×3): qty 4

## 2014-10-18 MED ORDER — PIPERACILLIN-TAZOBACTAM 3.375 G IVPB
3.3750 g | Freq: Three times a day (TID) | INTRAVENOUS | Status: DC
  Administered 2014-10-19 – 2014-10-22 (×10): 3.375 g via INTRAVENOUS
  Filled 2014-10-18 (×13): qty 50

## 2014-10-18 MED ORDER — ALLOPURINOL 300 MG PO TABS
300.0000 mg | ORAL_TABLET | Freq: Every day | ORAL | Status: DC
  Administered 2014-10-19 – 2014-10-22 (×4): 300 mg via ORAL
  Filled 2014-10-18 (×4): qty 1

## 2014-10-18 MED ORDER — MORPHINE SULFATE 2 MG/ML IJ SOLN
2.0000 mg | INTRAMUSCULAR | Status: DC | PRN
  Administered 2014-10-18: 2 mg via INTRAVENOUS
  Filled 2014-10-18: qty 1

## 2014-10-18 MED ORDER — ENOXAPARIN SODIUM 40 MG/0.4ML ~~LOC~~ SOLN
40.0000 mg | SUBCUTANEOUS | Status: DC
Start: 2014-10-19 — End: 2014-10-22
  Administered 2014-10-19 – 2014-10-22 (×4): 40 mg via SUBCUTANEOUS
  Filled 2014-10-18 (×4): qty 0.4

## 2014-10-18 MED ORDER — LOSARTAN POTASSIUM 25 MG PO TABS
25.0000 mg | ORAL_TABLET | Freq: Every day | ORAL | Status: DC
  Administered 2014-10-19 – 2014-10-20 (×2): 25 mg via ORAL
  Filled 2014-10-18 (×2): qty 1

## 2014-10-18 MED ORDER — INSULIN ASPART 100 UNIT/ML ~~LOC~~ SOLN
0.0000 [IU] | Freq: Three times a day (TID) | SUBCUTANEOUS | Status: DC
  Administered 2014-10-19 – 2014-10-22 (×6): 1 [IU] via SUBCUTANEOUS

## 2014-10-18 MED ORDER — MORPHINE SULFATE 2 MG/ML IJ SOLN: 2.0000 mg | INTRAMUSCULAR | Status: DC | PRN

## 2014-10-18 MED ORDER — ACETAMINOPHEN 325 MG PO TABS
650.0000 mg | ORAL_TABLET | Freq: Four times a day (QID) | ORAL | Status: DC | PRN
Start: 2014-10-18 — End: 2014-10-22

## 2014-10-18 MED ORDER — ONDANSETRON HCL 4 MG/2ML IJ SOLN
4.0000 mg | Freq: Once | INTRAMUSCULAR | Status: AC
  Administered 2014-10-18: 4 mg via INTRAVENOUS
  Filled 2014-10-18: qty 2

## 2014-10-18 MED ORDER — SODIUM CHLORIDE 0.9 % IJ SOLN
3.0000 mL | Freq: Two times a day (BID) | INTRAMUSCULAR | Status: DC
  Administered 2014-10-18 – 2014-10-22 (×6): 3 mL via INTRAVENOUS

## 2014-10-18 MED ORDER — ONDANSETRON HCL 4 MG/2ML IJ SOLN: 4.0000 mg | Freq: Four times a day (QID) | INTRAMUSCULAR | Status: DC | PRN

## 2014-10-18 MED ORDER — VERAPAMIL HCL ER 240 MG PO TBCR
240.0000 mg | EXTENDED_RELEASE_TABLET | Freq: Every day | ORAL | Status: DC
  Administered 2014-10-19 – 2014-10-22 (×4): 240 mg via ORAL
  Filled 2014-10-18 (×4): qty 1

## 2014-10-18 MED ORDER — VANCOMYCIN HCL 10 G IV SOLR
1250.0000 mg | INTRAVENOUS | Status: DC
  Administered 2014-10-19 – 2014-10-21 (×3): 1250 mg via INTRAVENOUS
  Filled 2014-10-18 (×4): qty 1250

## 2014-10-18 MED ORDER — ACETAMINOPHEN 650 MG RE SUPP: 650.0000 mg | Freq: Four times a day (QID) | RECTAL | Status: DC | PRN

## 2014-10-18 NOTE — ED Notes (Signed)
Unable to collect blood at this time patient stated he wants his medication first.  I made nurse aware.

## 2014-10-18 NOTE — Progress Notes (Signed)
New Admission Note:   Arrival Method: Stretcher via [facility] from [facility] ED Mental Orientation: alert and oriented Telemetry: N/A Assessment: Completed Skin: See docflowsheet IV: L AC Pain: Denies Tubes: N/A Safety Measures: Safety Fall Prevention Plan has been given, discussed. Admission: Completed [address] Orientation: Patient has been orientated to the room, unit and staff.  Family: None at the moment  Orders have been reviewed and implemented. Will continue to monitor the patient. Call light has been placed within reach and bed alarm has been activated.   [employer], RN-BC Phone number: [phone]

## 2014-10-18 NOTE — ED Notes (Addendum)
Pt states his right leg has been leaking fluid and swollen since yesterday. Denies pain, states his leg is stiff. Brace to left leg. No reported hx of blood clot, however in chart it does say he's had a DVT in the past.

## 2014-10-18 NOTE — H&P (Signed)
[facility] History and Physical  [name] [name] [id number] DOB: 03/23/1927 DOA: 10/18/2014  Referring physician: Dr.James. PCP: Alvester Chou, NP  Specialists: Dr.CHEN  Chief Complaint: Lower extremity edema swelling and pain.  HPI: [name] [name] is a 81 y.o. male with history of hypertension, chronic kidney disease, gout, diastolic dysfunction was brought to the ER after patient was having increasing swelling with increasing fluid draining from the legs and pain over the last 1 week. Patient states over the last one the patient's legs have become increasingly edematous with pain and fluid seeping. Most of the swelling is below the knee bilaterally. Patient had some subjective feeling of fever or chills. Denies any trauma. Dopplers done in the ER was negative for DVT as per the ER physician. Patient has been admitted for cellulitis of the lower extremity. Patient does have a history of peripheral arterial disease followed by Dr. Bridgett Larsson. At this time extremities are warm to touch. Patient denies any shortness of breath or chest pain.   Review of Systems: As presented in the history of presenting illness, rest negative.  Past Medical History  Diagnosis Date  . Hypertension   . Lymphadenitis 08/10/2012  . Gout   . Chronic kidney disease     RENAL INSUFICCIENCY  . Diabetes mellitus without complication   . DVT (deep venous thrombosis)    History reviewed. No pertinent past surgical history. Social History:  reports that he has quit smoking. His smoking use included Pipe. He has never used smokeless tobacco. He reports that he does not drink alcohol or use illicit drugs. Where does patient live home. Can patient participate in ADLs? Yes.  No Known Allergies  Family History:  Family History  Problem Relation Age of Onset  . Hypertension Mother   . Stroke Mother   . Hypertension Father       Prior to Admission medications   Medication Sig Start Date End Date Taking? Authorizing  Provider  allopurinol (ZYLOPRIM) 300 MG tablet Take 300 mg by mouth daily.   Yes Historical Provider, MD  furosemide (LASIX) 40 MG tablet Take 40 mg by mouth daily.   Yes Historical Provider, MD  losartan (COZAAR) 25 MG tablet Take 25 mg by mouth daily.   Yes Historical Provider, MD  verapamil (CALAN-SR) 240 MG CR tablet Take 240 mg by mouth daily.    Yes Historical Provider, MD    Physical Exam: Filed Vitals:   10/18/14 1730 10/18/14 1800 10/18/14 1812 10/18/14 1933  BP: 114/59 115/55 115/55 114/74  Pulse: 80 80 75   Temp:      TempSrc:      Resp:   18 18  Height:    5\' 10"  (1.778 m)  Weight:    109.77 kg (242 lb)  SpO2: 97% 98% 99% 100%     General:  Moderately built and nourished.  Eyes: Anicteric. No pallor.  ENT: No discharge from the ears eyes nose and mouth.  Neck: No mass felt. No JVD appreciated.  Cardiovascular: S1 and S2 heard.  Respiratory: No rhonchi or crepitations.  Abdomen: Soft nontender bowel sounds present.  Skin: Bilateral lower extremity skin is indurated and erythematous with fluid seeping.  Musculoskeletal: Bilateral lower extremity edema.  Psychiatric: Appears normal.  Neurologic: Alert awake oriented to time place and person. Moves all extremities.  Labs on Admission:  Basic Metabolic Panel:  Recent Labs Lab 10/18/14 1604  NA 137  K 4.1  CL 99*  CO2 28  GLUCOSE 137*  BUN 29*  CREATININE 1.47*  CALCIUM 9.0   Liver Function Tests:  Recent Labs Lab 10/18/14 1604  AST 22  ALT 11*  ALKPHOS 58  BILITOT 1.3*  PROT 7.8  ALBUMIN 3.4*   No results for input(s): LIPASE, AMYLASE in the last 168 hours. No results for input(s): AMMONIA in the last 168 hours. CBC:  Recent Labs Lab 10/18/14 1604  WBC 7.2  NEUTROABS 5.5  HGB 13.1  HCT 39.2  MCV 97.0  PLT 262   Cardiac Enzymes: No results for input(s): CKTOTAL, CKMB, CKMBINDEX, TROPONINI in the last 168 hours.  BNP (last 3 results) No results for input(s): BNP in the last  8760 hours.  ProBNP (last 3 results) No results for input(s): PROBNP in the last 8760 hours.  CBG: No results for input(s): GLUCAP in the last 168 hours.  Radiological Exams on Admission: No results found.   Assessment/Plan Principal Problem:   Cellulitis of both lower extremities Active Problems:   CKD (chronic kidney disease) stage 3, GFR 30-59 ml/min   Hypertension   Diastolic dysfunction   Lower extremity cellulitis   1. Bilateral lower extremity cellulitis with history of peripheral artery disease - at this time patient has been placed on vancomycin and Zosyn. I have ordered x-rays of the bilateral lower extremities. Check sedimentation rate. Since patient has history of peripheral arterial disease I am transferring patient to [facility] since patient may need to be seen by the patient's vascular surgeon Dr. Bridgett Larsson. Patient is agreeable to transfer. Dr. Posey Pronto will be the accepting physician. Wound team consult. 2. Hypertension - continue home medications. 3. Chronic kidney disease stage III - creatinine appears at baseline. 4. History of gout - continue allopurinol. 5. Diastolic dysfunction last EF measured in 2014 was 55-60% - continue Lasix.   DVT Prophylaxis Lovenox.  Code Status: Full code.  Family Communication: Discussed with patient.  Disposition Plan: Admit to inpatient.    KAKRAKANDY,ARSHAD N. [facility] Pager [phone].  If 7PM-7AM, please contact night-coverage [email or web address] Password [email or web address] 10/18/2014, 8:20 PM

## 2014-10-18 NOTE — Progress Notes (Signed)
VASCULAR LAB PRELIMINARY  PRELIMINARY  PRELIMINARY  PRELIMINARY  Bilateral lower extremity venous duplex completed.    Preliminary report:  Bilateral:  No evidence of DVT, superficial thrombosis, or Baker's Cyst. Moderate interstitial fluid bilaterally right greater than left.  Chrishonda Hesch, RVS 10/18/2014, 3:45 PM

## 2014-10-18 NOTE — ED Notes (Signed)
Unable to collect labs at this time patient getting a ultrasound.

## 2014-10-18 NOTE — ED Notes (Signed)
Ultrasound tech at bedside

## 2014-10-18 NOTE — ED Notes (Signed)
Attempted to call report, RN not available. Awaiting return call

## 2014-10-18 NOTE — ED Notes (Signed)
Notified EDP,James,MD. Pt. i-stat Lactic acid results 2.02.

## 2014-10-18 NOTE — ED Provider Notes (Signed)
CSN: [id number]     Arrival date & time 10/18/14  1334 History   First MD Initiated Contact with Patient 10/18/14 1438     Chief Complaint  Patient presents with  . Leg Swelling      HPI  She presents for evaluation complete by his daughter. He reports increasing swelling now weeping of fluid from his legs. Has a history of peripheral vascular disease and dependent edema. Also chronic renal insufficiency. Has had cellulitis previously in the leg as well. It is unclear in conversation with him secondary to dementia, but his daughter states that they have progressed over the last several days. He states he is compliant with his Lasix. His daughter is uncertain.  He states they're not painful. Denies fevers or chills or generalized illness. He is mostly wheelchair bound. He does occasionally ambulate with a walker.  Past Medical History  Diagnosis Date  . Hypertension   . Lymphadenitis 08/10/2012  . Gout   . Chronic kidney disease     RENAL INSUFICCIENCY  . Diabetes mellitus without complication   . DVT (deep venous thrombosis)    History reviewed. No pertinent past surgical history. Family History  Problem Relation Age of Onset  . Hypertension Mother   . Stroke Mother   . Hypertension Father    History  Substance Use Topics  . Smoking status: Former Smoker -- 40 years    Types: Pipe  . Smokeless tobacco: Never Used  . Alcohol Use: No    Review of Systems  Constitutional: Negative for fever, chills, diaphoresis, appetite change and fatigue.  HENT: Negative for mouth sores, sore throat and trouble swallowing.   Eyes: Negative for visual disturbance.  Respiratory: Negative for cough, chest tightness, shortness of breath and wheezing.   Cardiovascular: Positive for leg swelling. Negative for chest pain.  Gastrointestinal: Negative for nausea, vomiting, abdominal pain, diarrhea and abdominal distention.  Endocrine: Negative for polydipsia, polyphagia and polyuria.    Genitourinary: Negative for dysuria, frequency and hematuria.  Musculoskeletal: Negative for gait problem.  Skin: Positive for color change and wound. Negative for pallor and rash.  Neurological: Negative for dizziness, syncope, light-headedness and headaches.  Hematological: Does not bruise/bleed easily.  Psychiatric/Behavioral: Negative for behavioral problems and confusion.      Allergies  Review of patient's allergies indicates no known allergies.  Home Medications   Prior to Admission medications   Medication Sig Start Date End Date Taking? Authorizing Provider  allopurinol (ZYLOPRIM) 300 MG tablet Take 300 mg by mouth daily.   Yes Historical Provider, MD  furosemide (LASIX) 40 MG tablet Take 40 mg by mouth daily.   Yes Historical Provider, MD  losartan (COZAAR) 25 MG tablet Take 25 mg by mouth daily.   Yes Historical Provider, MD  verapamil (CALAN-SR) 240 MG CR tablet Take 240 mg by mouth daily.    Yes Historical Provider, MD   BP 115/55 mmHg  Pulse 75  Temp(Src) 98.3 F (36.8 C) (Oral)  Resp 18  SpO2 99% Physical Exam  Constitutional: He is oriented to person, place, and time. He appears well-developed and well-nourished. No distress.  HENT:  Head: Normocephalic.  Eyes: Conjunctivae are normal. Pupils are equal, round, and reactive to light. No scleral icterus.  Neck: Normal range of motion. Neck supple. No thyromegaly present.  Cardiovascular: Normal rate and regular rhythm.  Exam reveals no gallop and no friction rub.   No murmur heard. Pulmonary/Chest: Effort normal and breath sounds normal. No respiratory distress. He has no  wheezes. He has no rales.  Abdominal: Soft. Bowel sounds are normal. He exhibits no distension. There is no tenderness. There is no rebound.  Musculoskeletal: Normal range of motion.       Legs: Neurological: He is alert and oriented to person, place, and time.  Skin: Skin is warm and dry. No rash noted.  Psychiatric: He has a normal mood  and affect. His behavior is normal.    ED Course  Procedures (including critical care time) Labs Review Labs Reviewed  CBC WITH DIFFERENTIAL/PLATELET - Abnormal; Notable for the following:    RBC 4.04 (*)    All other components within normal limits  COMPREHENSIVE METABOLIC PANEL - Abnormal; Notable for the following:    Chloride 99 (*)    Glucose, Bld 137 (*)    BUN 29 (*)    Creatinine, Ser 1.47 (*)    Albumin 3.4 (*)    ALT 11 (*)    Total Bilirubin 1.3 (*)    GFR calc non Af Amer 41 (*)    GFR calc Af Amer 48 (*)    All other components within normal limits  SEDIMENTATION RATE - Abnormal; Notable for the following:    Sed Rate 85 (*)    All other components within normal limits  CULTURE, BLOOD (ROUTINE X 2)  CULTURE, BLOOD (ROUTINE X 2)  I-STAT CG4 LACTIC ACID, ED    Imaging Review No results found.   EKG Interpretation None      MDM   Final diagnoses:  Cellulitis of lower extremity, unspecified laterality    Cultures obtained. Given IV vancomycin. Care discussed with hospitalist. Patient will be admitted for cellulitis.    Tanna Furry, MD 10/18/14 [phone]

## 2014-10-18 NOTE — Progress Notes (Addendum)
ANTIBIOTIC CONSULT NOTE - INITIAL  Pharmacy Consult for Vancomycin  Indication: Cellulitis  No Known Allergies  Patient Measurements:   Adjusted Body Weight:   Vital Signs: Temp: 98.3 F (36.8 C) (06/22 1337) Temp Source: Oral (06/22 1337) BP: 115/55 mmHg (06/22 1812) Pulse Rate: 75 (06/22 1812) Intake/Output from previous day:   Intake/Output from this shift:    Labs:  Recent Labs  10/18/14 1604  WBC 7.2  HGB 13.1  PLT 262  CREATININE 1.47*   CrCl cannot be calculated (Unknown ideal weight.). No results for input(s): VANCOTROUGH, VANCOPEAK, VANCORANDOM, GENTTROUGH, GENTPEAK, GENTRANDOM, TOBRATROUGH, TOBRAPEAK, TOBRARND, AMIKACINPEAK, AMIKACINTROU, AMIKACIN in the last 72 hours.   Microbiology: No results found for this or any previous visit (from the past 720 hour(s)).  Medical History: Past Medical History  Diagnosis Date  . Hypertension   . Lymphadenitis 08/10/2012  . Gout   . Chronic kidney disease     RENAL INSUFICCIENCY  . Diabetes mellitus without complication   . DVT (deep venous thrombosis)     Assessment: 89 yoM with hx DVT, PVD, and CKD presents with RLE swelling.  Preliminary dopplers negative for DVT.  Pharmacy consulted to start Vancomycin for cellulitis. NKDA.  Labs: SCr 1.47, CrCl ~45 ml/min (N36),  WBC WNL, Afebrile.   6/22 >> Vancomycin  >>    6/22 bloodx2: ordered / urine:  / sputum:   Dose changes/levels:  Goal of Therapy:  Vancomycin trough level 10-15 mcg/ml  Plan:  Vancomycin 2000mg  IV x 1, then 1250mg  IV q24h F/u renal fxn, cultures, clinical course  Ralene Bathe, PharmD, BCPS 10/18/2014, 7:50 PM  Pager: [phone]    ADDENDUM: To broaden ABX, will start Zosyn 3.375g IV Q8H and monitor CBC and Cx. Wynona Neat, PharmD, BCPS 10/18/2014 11:05 PM

## 2014-10-19 DIAGNOSIS — L03116 Cellulitis of left lower limb: Secondary | ICD-10-CM

## 2014-10-19 DIAGNOSIS — N183 Chronic kidney disease, stage 3 (moderate): Secondary | ICD-10-CM

## 2014-10-19 DIAGNOSIS — I519 Heart disease, unspecified: Secondary | ICD-10-CM

## 2014-10-19 DIAGNOSIS — L03115 Cellulitis of right lower limb: Principal | ICD-10-CM

## 2014-10-19 DIAGNOSIS — I10 Essential (primary) hypertension: Secondary | ICD-10-CM

## 2014-10-19 LAB — CBC WITH DIFFERENTIAL/PLATELET
BASOS ABS: 0 10*3/uL (ref 0.0–0.1)
BASOS PCT: 0 % (ref 0–1)
Eosinophils Absolute: 0 10*3/uL (ref 0.0–0.7)
Eosinophils Relative: 0 % (ref 0–5)
HCT: 36.1 % — ABNORMAL LOW (ref 39.0–52.0)
HEMOGLOBIN: 12.1 g/dL — AB (ref 13.0–17.0)
Lymphocytes Relative: 12 % (ref 12–46)
Lymphs Abs: 0.9 10*3/uL (ref 0.7–4.0)
MCH: 32.1 pg (ref 26.0–34.0)
MCHC: 33.5 g/dL (ref 30.0–36.0)
MCV: 95.8 fL (ref 78.0–100.0)
MONOS PCT: 15 % — AB (ref 3–12)
Monocytes Absolute: 1.1 10*3/uL — ABNORMAL HIGH (ref 0.1–1.0)
NEUTROS ABS: 5.3 10*3/uL (ref 1.7–7.7)
Neutrophils Relative %: 73 % (ref 43–77)
Platelets: 227 10*3/uL (ref 150–400)
RBC: 3.77 MIL/uL — AB (ref 4.22–5.81)
RDW: 14.4 % (ref 11.5–15.5)
WBC: 7.3 10*3/uL (ref 4.0–10.5)

## 2014-10-19 LAB — COMPREHENSIVE METABOLIC PANEL
ALBUMIN: 2.8 g/dL — AB (ref 3.5–5.0)
ALT: 10 U/L — ABNORMAL LOW (ref 17–63)
AST: 24 U/L (ref 15–41)
Alkaline Phosphatase: 48 U/L (ref 38–126)
Anion gap: 12 (ref 5–15)
BUN: 22 mg/dL — ABNORMAL HIGH (ref 6–20)
CALCIUM: 8.5 mg/dL — AB (ref 8.9–10.3)
CO2: 26 mmol/L (ref 22–32)
CREATININE: 1.46 mg/dL — AB (ref 0.61–1.24)
Chloride: 98 mmol/L — ABNORMAL LOW (ref 101–111)
GFR calc non Af Amer: 42 mL/min — ABNORMAL LOW (ref >60)
GFR, EST AFRICAN AMERICAN: 48 mL/min — AB (ref >60)
GLUCOSE: 115 mg/dL — AB (ref 65–99)
POTASSIUM: 3.9 mmol/L (ref 3.5–5.1)
Sodium: 136 mmol/L (ref 135–145)
Total Bilirubin: 1.7 mg/dL — ABNORMAL HIGH (ref 0.3–1.2)
Total Protein: 6.4 g/dL — ABNORMAL LOW (ref 6.5–8.1)

## 2014-10-19 LAB — GLUCOSE, CAPILLARY
GLUCOSE-CAPILLARY: 122 mg/dL — AB (ref 65–99)
Glucose-Capillary: 115 mg/dL — ABNORMAL HIGH (ref 65–99)
Glucose-Capillary: 122 mg/dL — ABNORMAL HIGH (ref 65–99)
Glucose-Capillary: 124 mg/dL — ABNORMAL HIGH (ref 65–99)

## 2014-10-19 LAB — CREATININE, SERUM
CREATININE: 1.37 mg/dL — AB (ref 0.61–1.24)
GFR calc Af Amer: 52 mL/min — ABNORMAL LOW (ref >60)
GFR, EST NON AFRICAN AMERICAN: 45 mL/min — AB (ref >60)

## 2014-10-19 LAB — TSH: TSH: 2.111 u[IU]/mL (ref 0.350–4.500)

## 2014-10-19 LAB — MRSA PCR SCREENING: MRSA BY PCR: NEGATIVE

## 2014-10-19 LAB — BRAIN NATRIURETIC PEPTIDE: B Natriuretic Peptide: 60.5 pg/mL (ref 0.0–100.0)

## 2014-10-19 MED ORDER — HYDROCERIN EX CREA
TOPICAL_CREAM | Freq: Every day | CUTANEOUS | Status: DC
  Administered 2014-10-19 – 2014-10-22 (×4): via TOPICAL
  Filled 2014-10-19: qty 113

## 2014-10-19 NOTE — Consult Note (Signed)
WOC wound consult note Reason for Consult: Consult requested for bilat legs.  Pt states he has psoriasis to skin and this is evident to head, bilat upper legs/knees, and arms.  This is beyond [facility] scope of practice and patient should be referred to a dermatologist after discharge. He states he had increased swelling and drainage beginning last week from his legs.  He has worn compression wraps in the past when his legs were remaining swollen, he states. Wound type: Partial thickness stasis ulcers; affected areas are anterior and posterior patchy areas to bilat calves Wound bed: Pink and moist. Depth does not exceed .1cm Drainage (amount, consistency, odor) Small amt yellow drainage, no odor. Dressing procedure/placement/frequency: Eucerin to dry skin on arms and upper legs to assist with removal of dry scaly skin. Bedside nurses to change Xeroform gauze to promote healing and ace wrap for light compression.  Recommend home health follow-up after discharge for dressing change assistance. Discussed plan of care with patient and he verbalizes understanding. Please re-consult if further assistance is needed.  Thank-you,  Julien Girt MSN, [place], [place], [place], [place]

## 2014-10-19 NOTE — Progress Notes (Signed)
[facility] PROGRESS NOTE   [name] [name] [id number] DOB: 1926/08/14 DOA: 10/18/2014 PCP: Alvester Chou, NP  HPI/Subjective: Patient does have a variety of psoriasis over his lower extremity. Now it is weeping fluids.  Assessment/Plan: Principal Problem:   Cellulitis of both lower extremities Active Problems:   CKD (chronic kidney disease) stage 3, GFR 30-59 ml/min   Hypertension   Diastolic dysfunction   Lower extremity cellulitis    Bilateral lower extremity cellulitis Patient has lower extremity edema, probable lower extremity psoriasis came in with lower extremity weeping fluids. Started on vancomycin and Zosyn. Wound nurse consulted, recommended Eucerin cream for dry skin, Xeroform gauze and Ace wraps. Hold on consulting surgery today.  Hypertension Continue medications  Chronic kidney disease stage III Creatinine appears to be at baseline.  History of gout Continue his allopurinol.  Chronic diastolic dysfunction Last LVEF measured 02/14/2013 was 55-60%, continue Lasix.  Code Status: Full Code Family Communication: Plan discussed with the patient. Disposition Plan: Remains inpatient Diet: Diet heart healthy/carb modified Room service appropriate?: Yes; Fluid consistency:: Thin; Fluid restriction:: 1200 mL Fluid  Consultants:  None  Procedures:  None   Antibiotics:  Zosyn and vancomycin    Objective: Filed Vitals:   10/19/14 1343  BP: 115/65  Pulse:   Temp:   Resp:     Intake/Output Summary (Last 24 hours) at 10/19/14 1436 Last data filed at 10/19/14 1343  Gross per 24 hour  Intake    923 ml  Output   1375 ml  Net   -452 ml   Filed Weights   10/18/14 1933 10/18/14 2304  Weight: 109.77 kg (242 lb) 110.678 kg (244 lb)    Exam: General: Alert and awake, oriented x3, not in any acute distress. HEENT: anicteric sclera, pupils reactive to light and accommodation, EOMI CVS: S1-S2 clear, no murmur rubs or gallops Chest: clear to  auscultation bilaterally, no wheezing, rales or rhonchi Abdomen: soft nontender, nondistended, normal bowel sounds, no organomegaly Extremities: no cyanosis, clubbing or edema noted bilaterally Neuro: Cranial nerves II-XII intact, no focal neurological deficits  Data Reviewed: Basic Metabolic Panel:  Recent Labs Lab 10/18/14 1604 10/18/14 2320 10/19/14 0646  NA 137  --  136  K 4.1  --  3.9  CL 99*  --  98*  CO2 28  --  26  GLUCOSE 137*  --  115*  BUN 29*  --  22*  CREATININE 1.47* 1.37* 1.46*  CALCIUM 9.0  --  8.5*   Liver Function Tests:  Recent Labs Lab 10/18/14 1604 10/19/14 0646  AST 22 24  ALT 11* 10*  ALKPHOS 58 48  BILITOT 1.3* 1.7*  PROT 7.8 6.4*  ALBUMIN 3.4* 2.8*   No results for input(s): LIPASE, AMYLASE in the last 168 hours. No results for input(s): AMMONIA in the last 168 hours. CBC:  Recent Labs Lab 10/18/14 1604 10/18/14 2320 10/19/14 0646  WBC 7.2 8.2 7.3  NEUTROABS 5.5  --  5.3  HGB 13.1 12.5* 12.1*  HCT 39.2 37.0* 36.1*  MCV 97.0 94.9 95.8  PLT 262 237 227   Cardiac Enzymes: No results for input(s): CKTOTAL, CKMB, CKMBINDEX, TROPONINI in the last 168 hours. BNP (last 3 results)  Recent Labs  10/18/14 2320  BNP 60.5    ProBNP (last 3 results) No results for input(s): PROBNP in the last 8760 hours.  CBG:  Recent Labs Lab 10/18/14 2313 10/19/14 0759 10/19/14 1201  GLUCAP 118* 115* 122*    Micro Recent Results (from the past  240 hour(s))  Culture, blood (routine x 2)     Status: None (Preliminary result)   Collection Time: 10/18/14  8:25 PM  Result Value Ref Range Status   Specimen Description BLOOD LEFT ANTECUBITAL  Final   Special Requests BOTTLES DRAWN AEROBIC AND ANAEROBIC 5CC  Final   Culture   Final    NO GROWTH < 12 HOURS Performed at [facility]    Report Status PENDING  Incomplete  Culture, blood (routine x 2)     Status: None (Preliminary result)   Collection Time: 10/18/14  8:26 PM  Result Value  Ref Range Status   Specimen Description BLOOD RIGHT ANTECUBITAL  Final   Special Requests BOTTLES DRAWN AEROBIC AND ANAEROBIC 3ML  Final   Culture   Final    NO GROWTH < 12 HOURS Performed at [facility]    Report Status PENDING  Incomplete  MRSA PCR Screening     Status: None   Collection Time: 10/19/14 12:02 AM  Result Value Ref Range Status   MRSA by PCR NEGATIVE NEGATIVE Final    Comment:        The GeneXpert MRSA Assay (FDA approved for NASAL specimens only), is one component of a comprehensive MRSA colonization surveillance program. It is not intended to diagnose MRSA infection nor to guide or monitor treatment for MRSA infections.      Studies: Dg Chest 1 View  10/18/2014   CLINICAL DATA:  Renal insufficiency and lower extremity edema  EXAM: CHEST  1 VIEW  COMPARISON:  October 15, 2012  FINDINGS: There is no edema or consolidation. Heart is upper normal in size with pulmonary vascularity within normal limits. No adenopathy. No bone lesions.  IMPRESSION: No edema or consolidation.   Electronically Signed   By: Lowella Grip III M.D.   On: 10/18/2014 21:05   Dg Tibia/fibula Left  10/18/2014   CLINICAL DATA:  Cellulitis and chronic renal insufficiency. Soft tissue swelling.  EXAM: LEFT TIBIA AND FIBULA - 2 VIEW  COMPARISON:  November 15, 2012  FINDINGS: Frontal and lateral views obtained. There is no fracture or dislocation. No erosive change or bony destruction. There are multiple foci of arterial vascular calcification. There is osteoarthritic change in the knee and ankle joints.  IMPRESSION: No fracture or dislocation. No erosive change. No blastic or lytic bone lesions. Osteoarthritic changes noted in the knee and ankle regions. There are multiple foci of arterial vascular calcification.   Electronically Signed   By: Lowella Grip III M.D.   On: 10/18/2014 20:56   Dg Tibia/fibula Right  10/18/2014   CLINICAL DATA:  Soft tissue swelling and cellulitis  EXAM: RIGHT  TIBIA AND FIBULA - 2 VIEW  COMPARISON:  August 05, 2012  FINDINGS: Frontal and lateral views obtained. No fracture or dislocation. No erosive change or bony destruction. There is osteoarthritic change in the knee and ankle regions. There are foci of arterial vascular calcification.  IMPRESSION: No fracture or dislocation. No blastic or lytic bone lesions. No erosive change or bony destruction. Multiple foci of arterial vascular calcification.   Electronically Signed   By: Lowella Grip III M.D.   On: 10/18/2014 20:57    Scheduled Meds: . allopurinol  300 mg Oral Daily  . enoxaparin (LOVENOX) injection  40 mg Subcutaneous Q24H  . furosemide  40 mg Intravenous Daily  . hydrocerin   Topical Daily  . insulin aspart  0-9 Units Subcutaneous TID WC  . losartan  25 mg Oral  Daily  . piperacillin-tazobactam (ZOSYN)  IV  3.375 g Intravenous Q8H  . sodium chloride  3 mL Intravenous Q12H  . vancomycin  1,250 mg Intravenous Q24H  . verapamil  240 mg Oral Daily   Continuous Infusions:      Time spent: 35 minutes    [facility], [place] A  [facility] Pager [phone] If 7PM-7AM, please contact night-coverage at [email or web address], password [facility] 10/19/2014, 2:36 PM  LOS: 1 day

## 2014-10-20 DIAGNOSIS — E119 Type 2 diabetes mellitus without complications: Secondary | ICD-10-CM

## 2014-10-20 LAB — BASIC METABOLIC PANEL
Anion gap: 9 (ref 5–15)
BUN: 19 mg/dL (ref 6–20)
CALCIUM: 8.2 mg/dL — AB (ref 8.9–10.3)
CO2: 28 mmol/L (ref 22–32)
CREATININE: 1.72 mg/dL — AB (ref 0.61–1.24)
Chloride: 100 mmol/L — ABNORMAL LOW (ref 101–111)
GFR calc Af Amer: 40 mL/min — ABNORMAL LOW (ref >60)
GFR, EST NON AFRICAN AMERICAN: 34 mL/min — AB (ref >60)
GLUCOSE: 103 mg/dL — AB (ref 65–99)
Potassium: 3.5 mmol/L (ref 3.5–5.1)
SODIUM: 137 mmol/L (ref 135–145)

## 2014-10-20 LAB — GLUCOSE, CAPILLARY
Glucose-Capillary: 100 mg/dL — ABNORMAL HIGH (ref 65–99)
Glucose-Capillary: 144 mg/dL — ABNORMAL HIGH (ref 65–99)

## 2014-10-20 NOTE — Evaluation (Signed)
Physical Therapy Evaluation Patient Details Name: Jenner Skau MRN: [id number] DOB: 11-Nov-1926 Today's Date: 10/20/2014   History of Present Illness  81 yo male with onset of LE edema and cellulitis was found to have vasc calcifications, gout, CKD.  Clinical Impression  Pt was seen for evaluation of therapy needs with a limited tolerance for standing and gait distance.  Discomfort is main issue as is tendency to shift out on walker in front of him.  He is too generally weak to succeed at home given his previous independence and did discuss with him the plan to go to SNF for short strengthening bout.  He is thinking about this as he is motivated to go home.    Follow Up Recommendations SNF    Equipment Recommendations  None recommended by PT    Recommendations for Other Services       Precautions / Restrictions Precautions Precautions: Fall Restrictions Weight Bearing Restrictions: No      Mobility  Bed Mobility Overal bed mobility: Needs Assistance Bed Mobility: Supine to Sit     Supine to sit: Mod assist     General bed mobility comments: Pt up in chair  Transfers Overall transfer level: Needs assistance Equipment used: Rolling walker (2 wheeled);1 person hand held assist Transfers: Sit to/from [employer] Transfers Sit to Stand: Supervision Stand pivot transfers: Supervision       General transfer comment: cues for sequence and reminders for hand placement and tends to let walker get out in front of him  Ambulation/Gait Ambulation/Gait assistance: Min guard;Min assist Ambulation Distance (Feet): 80 Feet Assistive device: Rolling walker (2 wheeled);1 person hand held assist Gait Pattern/deviations: Step-through pattern;Decreased step length - right;Decreased step length - left;Shuffle;Wide base of support;Trunk flexed Gait velocity: reduced due to leg pain Gait velocity interpretation: Below normal speed for age/gender General Gait Details: cues to stay  inside walker and tends to let walker get too far infront of him, struggles to clear tight spaces  Stairs            Wheelchair Mobility    Modified Rankin (Stroke Patients Only)       Balance Overall balance assessment: Needs assistance Sitting-balance support: Feet supported Sitting balance-Leahy Scale: Good   Postural control: Posterior lean Standing balance support: Bilateral upper extremity supported Standing balance-Leahy Scale: Fair Standing balance comment: fair- dynamic balance support                             Pertinent Vitals/Pain Pain Assessment: No/denies pain    Home Living Family/patient expects to be discharged to:: Private residence Living Arrangements: Alone Available Help at Discharge: Family Type of Home: Apartment Home Access: Level entry     Home Layout: One level Home Equipment: Environmental consultant - 2 wheels;Walker - 4 wheels;Cane - quad;Walker - standard;Bedside commode Additional Comments: handicapped/senior adult apartments    Prior Function Level of Independence: Independent with assistive device(s)               Hand Dominance   Dominant Hand: Right    Extremity/Trunk Assessment   Upper Extremity Assessment: Overall WFL for tasks assessed           Lower Extremity Assessment: Defer to PT evaluation      Cervical / Trunk Assessment: Normal  Communication   Communication: No difficulties  Cognition Arousal/Alertness: Awake/alert Behavior During Therapy: WFL for tasks assessed/performed Overall Cognitive Status: Within Functional Limits for tasks assessed  General Comments General comments (skin integrity, edema, etc.): Pt is hoping to go directly home as he has been independent with all cooking and housekeeping    Exercises        Assessment/Plan    PT Assessment Patient needs continued PT services  PT Diagnosis Difficulty walking;Generalized weakness;Acute pain   PT  Problem List Decreased strength;Decreased range of motion;Decreased activity tolerance;Decreased balance;Decreased mobility;Decreased coordination;Decreased knowledge of use of DME;Cardiopulmonary status limiting activity;Obesity;Decreased skin integrity;Pain  PT Treatment Interventions DME instruction;Gait training;Functional mobility training;Therapeutic activities;Therapeutic exercise;Balance training;Neuromuscular re-education;Patient/family education   PT Goals (Current goals can be found in the Care Plan section) Acute Rehab PT Goals Patient Stated Goal: to go home soon PT Goal Formulation: With patient Time For Goal Achievement: 11/03/14 Potential to Achieve Goals: Good    Frequency Min 2X/week   Barriers to discharge Inaccessible home environment;Decreased caregiver support      Co-evaluation               End of Session Equipment Utilized During Treatment: Gait belt Activity Tolerance: Patient tolerated treatment well Patient left: in chair;with call bell/phone within reach;with chair alarm set Nurse Communication: Mobility status         Time: [id number] PT Time Calculation (min) (ACUTE ONLY): 33 min   Charges:   PT Evaluation $Initial PT Evaluation Tier I: 1 Procedure PT Treatments $Gait Training: 8-22 mins   PT G CodesRamond Dial 11-14-2014, 5:15 PM   Mee Hives, PT MS Acute Rehab Dept. Number: [phone] [phone] and [place] [phone]

## 2014-10-20 NOTE — Progress Notes (Signed)
Occupational Therapy Evaluation Patient Details Name: [name] [name] MRN: [id number] DOB: Mar 11, 1927 Today's Date: 10/20/2014    History of Present Illness 81 yo male with onset of LE edema and cellulitis was found to have vasc calcifications, gout, CKD.   Clinical Impression   PTA, pt lived alone and was mod I with ADL and mobility. Pt close to baseline level of function with ADL and mobility @ RW level. Pt would benefit from home health OT, but pt declines. Pt needs a shower seat (without a back) for safe D/C. Pt ready to D/C home when medically stable.     Follow Up Recommendations  Home health OT (pt declines home health)    Equipment Recommendations  Tub/shower seat (without a back)    Recommendations for Other Services       Precautions / Restrictions Precautions Precautions: Fall Restrictions Weight Bearing Restrictions: No      Mobility Bed Mobility Overal bed mobility: Needs Assistance Bed Mobility: Supine to Sit     Supine to sit: Mod assist     General bed mobility comments: Pt up in chair  Transfers Overall transfer level: Needs assistance Equipment used: Rolling walker (2 wheeled);1 person hand held assist Transfers: Sit to/from [employer] Transfers Sit to Stand: Supervision Stand pivot transfers: Supervision          Balance Overall balance assessment: Needs assistance Sitting-balance support: Feet supported Sitting balance-Leahy Scale: Good   Postural control: Posterior lean Standing balance support: Bilateral upper extremity supported Standing balance-Leahy Scale: Fair Standing balance comment: fair- dynamic balance support                            ADL Overall ADL's : Needs assistance/impaired     Grooming: Modified independent   Upper Body Bathing: Set up   Lower Body Bathing: Set up;Sit to/from stand   Upper Body Dressing : Set up   Lower Body Dressing: Set up   Toilet Transfer:  Supervision/safety;Ambulation;Comfort height toilet   Toileting- Clothing Manipulation and Hygiene: Modified independent;Sit to/from stand       Functional mobility during ADLs: Supervision/safety;Rolling walker General ADL Comments: Close to baseline level with ADL. Would benefit from [employer]     Perception     Praxis      Pertinent Vitals/Pain Pain Assessment: No/denies pain     Hand Dominance Right   Extremity/Trunk Assessment Upper Extremity Assessment Upper Extremity Assessment: Overall WFL for tasks assessed   Lower Extremity Assessment Lower Extremity Assessment: Defer to PT evaluation   Cervical / Trunk Assessment Cervical / Trunk Assessment: Normal   Communication Communication Communication: No difficulties   Cognition Arousal/Alertness: Awake/alert Behavior During Therapy: WFL for tasks assessed/performed Overall Cognitive Status: Within Functional Limits for tasks assessed                     General Comments       Exercises   Shoulder Instructions      Home Living Family/patient expects to be discharged to:: Private residence Living Arrangements: Alone Available Help at Discharge: Family Type of Home: Apartment Home Access: Level entry     Home Layout: One level     Bathroom Shower/Tub: Tub/shower unit Shower/tub characteristics: Architectural technologist: Handicapped height Bathroom Accessibility: Yes How Accessible: Accessible via wheelchair Home Equipment: Environmental consultant - 2 wheels;Walker - 4 wheels;Cane - quad;Walker - standard;Bedside commode   Additional Comments: handicapped/senior adult apartments  Prior Functioning/Environment Level of Independence: Independent with assistive device(s)             OT Diagnosis: Generalized weakness   OT Problem List: Decreased activity tolerance   OT Treatment/Interventions:      OT Goals(Current goals can be found in the care plan section) Acute Rehab OT  Goals Patient Stated Goal: to go home soon OT Goal Formulation: All assessment and education complete, DC therapy  OT Frequency:     Barriers to D/C:            Co-evaluation              End of Session Equipment Utilized During Treatment: Rolling walker Nurse Communication: Mobility status  Activity Tolerance: Patient tolerated treatment well Patient left: in chair;with call bell/phone within reach   Time: [id number] OT Time Calculation (min): 24 min Charges:  OT General Charges $OT Visit: 1 Procedure OT Evaluation $Initial OT Evaluation Tier I: 1 Procedure OT Treatments $Self Care/Home Management : 8-22 mins G-Codes:    Zoriana Oats,HILLARY Nov 10, 2014, 4:46 PM   Maurie Boettcher, OTR/L  [phone] 11-10-2014

## 2014-10-20 NOTE — Care Management Note (Addendum)
Case Management Note  Patient Details  Name: [name] [name] MRN: [id number] Date of Birth: 05-Sep-1926  Subjective/Objective:           CM following for progression and d/c planning.         Action/Plan: 10/20/2014 Met with pt and daughter re need for [place] to see this pt. Pt daughter states that she can provide transportation to [facility] as Tue or [place] am as the wound center in [place] is booked thru August. This CM contacted [place] and appointment scheduled for Tue October 31, 2014 @ 10:15am. Pt and daughter informed and are agreeable. Daughter states that pt [facility] provider is [facility] and that he pt has DME, no other needs identified. 10/20/2014 Pt now declining HHPT, does wish to have [place], orders faxed to [facility] per pt wishes.  Expected Discharge Date:   (unknown)               Expected Discharge Plan:  [place]  In-House Referral:  NA  Discharge planning Services  NA  Post Acute Care Choice:  Home Health Choice offered to:  Patient  DME Arranged:    DME Agency:     HH Arranged:  RN, PT HH Agency:    [facility]  Status of Service:  Completed, signed off  Medicare Important Message Given:  Yes Date Medicare IM Given:  10/20/14 Medicare IM give by:  Jasmine Pang RN MPH, case manager, [phone] Date Additional Medicare IM Given:    Additional Medicare Important Message give by:     If discussed at [place] of Stay Meetings, dates discussed:    Additional Comments: consult for bath chair, however pt insurance will not cover per [facility] DME rep.  Laryssa Hassing, Rory Percy, RN 10/20/2014, 12:28 PM

## 2014-10-20 NOTE — Clinical Documentation Improvement (Signed)
A cause and effect relationship may not be assumed and must be documented by a provider. Please clarify the relationship, if any, between Cellulitis and Diabetes.  Are the conditions: ? Due to or associated with each other ? Other (please specify)  ? Unrelated to each other ? Unable to determine ? Unknown     Thank You, Alessandra Grout, RN, BSN, CCDS,Clinical Documentation Specialist:  [phone]  [phone]=[facility] [place]- Health Information Management

## 2014-10-20 NOTE — Progress Notes (Signed)
[facility] PROGRESS NOTE   [name] [name] [id number] DOB: Dec 20, 1926 DOA: 10/18/2014 PCP: Alvester Chou, NP  HPI/Subjective: Patient's legs wrapped with Ace wraps, stated that he feels much better. PT/OT to evaluate and treat, likely discharge in a.m. if still feels okay.  Assessment/Plan: Principal Problem:   Cellulitis of both lower extremities Active Problems:   Diabetes mellitus   CKD (chronic kidney disease) stage 3, GFR 30-59 ml/min   Hypertension   Diastolic dysfunction   Lower extremity cellulitis    Bilateral lower extremity cellulitis Patient has lower extremity edema, probable lower extremity psoriasis came in with lower extremity weeping fluids. Started on vancomycin and Zosyn, continue antibiotics. Wound nurse consulted, recommended Eucerin cream for dry skin, Xeroform gauze and Ace wraps. Hold on consulting vascular surgery.  Hypertension Continue medications  Chronic kidney disease stage III Creatinine appears to be at baseline.  History of gout Continue his allopurinol.  Chronic diastolic dysfunction Last LVEF measured 02/14/2013 was 55-60%, continue Lasix.  Diabetes mellitus type 2 Hemoglobin A1c checked in June 2014 was 5.8, not on any hypoglycemic medications. Check hemoglobin A1c. I do not see any relationship between diabetes mellitus and his current lower extremity cellulitis.   Code Status: Full Code Family Communication: Plan discussed with the patient. Disposition Plan: Remains inpatient Diet: Diet heart healthy/carb modified Room service appropriate?: Yes; Fluid consistency:: Thin; Fluid restriction:: 1200 mL Fluid  Consultants:  None  Procedures:  None   Antibiotics:  Zosyn and vancomycin    Objective: Filed Vitals:   10/20/14 0500  BP: 104/53  Pulse: 64  Temp: 99.2 F (37.3 C)  Resp: 18    Intake/Output Summary (Last 24 hours) at 10/20/14 1507 Last data filed at 10/20/14 1130  Gross per 24 hour  Intake     910 ml  Output   1375 ml  Net   -465 ml   Filed Weights   10/18/14 1933 10/18/14 2304 10/19/14 2103  Weight: 109.77 kg (242 lb) 110.678 kg (244 lb) 112.855 kg (248 lb 12.8 oz)    Exam: General: Alert and awake, oriented x3, not in any acute distress. HEENT: anicteric sclera, pupils reactive to light and accommodation, EOMI CVS: S1-S2 clear, no murmur rubs or gallops Chest: clear to auscultation bilaterally, no wheezing, rales or rhonchi Abdomen: soft nontender, nondistended, normal bowel sounds, no organomegaly Extremities: no cyanosis, clubbing or edema noted bilaterally Neuro: Cranial nerves II-XII intact, no focal neurological deficits  Data Reviewed: Basic Metabolic Panel:  Recent Labs Lab 10/18/14 1604 10/18/14 2320 10/19/14 0646 10/20/14 0529  NA 137  --  136 137  K 4.1  --  3.9 3.5  CL 99*  --  98* 100*  CO2 28  --  26 28  GLUCOSE 137*  --  115* 103*  BUN 29*  --  22* 19  CREATININE 1.47* 1.37* 1.46* 1.72*  CALCIUM 9.0  --  8.5* 8.2*   Liver Function Tests:  Recent Labs Lab 10/18/14 1604 10/19/14 0646  AST 22 24  ALT 11* 10*  ALKPHOS 58 48  BILITOT 1.3* 1.7*  PROT 7.8 6.4*  ALBUMIN 3.4* 2.8*   No results for input(s): LIPASE, AMYLASE in the last 168 hours. No results for input(s): AMMONIA in the last 168 hours. CBC:  Recent Labs Lab 10/18/14 1604 10/18/14 2320 10/19/14 0646  WBC 7.2 8.2 7.3  NEUTROABS 5.5  --  5.3  HGB 13.1 12.5* 12.1*  HCT 39.2 37.0* 36.1*  MCV 97.0 94.9 95.8  PLT 262 237 227  Cardiac Enzymes: No results for input(s): CKTOTAL, CKMB, CKMBINDEX, TROPONINI in the last 168 hours. BNP (last 3 results)  Recent Labs  10/18/14 2320  BNP 60.5    ProBNP (last 3 results) No results for input(s): PROBNP in the last 8760 hours.  CBG:  Recent Labs Lab 10/19/14 1201 10/19/14 1651 10/19/14 2103 10/20/14 0821 10/20/14 1149  GLUCAP 122* 124* 122* 100* 144*    Micro Recent Results (from the past 240 hour(s))  Culture,  blood (routine x 2)     Status: None (Preliminary result)   Collection Time: 10/18/14  8:25 PM  Result Value Ref Range Status   Specimen Description BLOOD LEFT ANTECUBITAL  Final   Special Requests BOTTLES DRAWN AEROBIC AND ANAEROBIC 5CC  Final   Culture   Final    NO GROWTH < 12 HOURS Performed at [facility]    Report Status PENDING  Incomplete  Culture, blood (routine x 2)     Status: None (Preliminary result)   Collection Time: 10/18/14  8:26 PM  Result Value Ref Range Status   Specimen Description BLOOD RIGHT ANTECUBITAL  Final   Special Requests BOTTLES DRAWN AEROBIC AND ANAEROBIC 3ML  Final   Culture   Final    NO GROWTH < 12 HOURS Performed at [facility]    Report Status PENDING  Incomplete  MRSA PCR Screening     Status: None   Collection Time: 10/19/14 12:02 AM  Result Value Ref Range Status   MRSA by PCR NEGATIVE NEGATIVE Final    Comment:        The GeneXpert MRSA Assay (FDA approved for NASAL specimens only), is one component of a comprehensive MRSA colonization surveillance program. It is not intended to diagnose MRSA infection nor to guide or monitor treatment for MRSA infections.      Studies: Dg Chest 1 View  10/18/2014   CLINICAL DATA:  Renal insufficiency and lower extremity edema  EXAM: CHEST  1 VIEW  COMPARISON:  October 15, 2012  FINDINGS: There is no edema or consolidation. Heart is upper normal in size with pulmonary vascularity within normal limits. No adenopathy. No bone lesions.  IMPRESSION: No edema or consolidation.   Electronically Signed   By: Lowella Grip III M.D.   On: 10/18/2014 21:05   Dg Tibia/fibula Left  10/18/2014   CLINICAL DATA:  Cellulitis and chronic renal insufficiency. Soft tissue swelling.  EXAM: LEFT TIBIA AND FIBULA - 2 VIEW  COMPARISON:  November 15, 2012  FINDINGS: Frontal and lateral views obtained. There is no fracture or dislocation. No erosive change or bony destruction. There are multiple foci of arterial  vascular calcification. There is osteoarthritic change in the knee and ankle joints.  IMPRESSION: No fracture or dislocation. No erosive change. No blastic or lytic bone lesions. Osteoarthritic changes noted in the knee and ankle regions. There are multiple foci of arterial vascular calcification.   Electronically Signed   By: Lowella Grip III M.D.   On: 10/18/2014 20:56   Dg Tibia/fibula Right  10/18/2014   CLINICAL DATA:  Soft tissue swelling and cellulitis  EXAM: RIGHT TIBIA AND FIBULA - 2 VIEW  COMPARISON:  August 05, 2012  FINDINGS: Frontal and lateral views obtained. No fracture or dislocation. No erosive change or bony destruction. There is osteoarthritic change in the knee and ankle regions. There are foci of arterial vascular calcification.  IMPRESSION: No fracture or dislocation. No blastic or lytic bone lesions. No erosive change or bony destruction.  Multiple foci of arterial vascular calcification.   Electronically Signed   By: Lowella Grip III M.D.   On: 10/18/2014 20:57    Scheduled Meds: . allopurinol  300 mg Oral Daily  . enoxaparin (LOVENOX) injection  40 mg Subcutaneous Q24H  . furosemide  40 mg Intravenous Daily  . hydrocerin   Topical Daily  . insulin aspart  0-9 Units Subcutaneous TID WC  . losartan  25 mg Oral Daily  . piperacillin-tazobactam (ZOSYN)  IV  3.375 g Intravenous Q8H  . sodium chloride  3 mL Intravenous Q12H  . vancomycin  1,250 mg Intravenous Q24H  . verapamil  240 mg Oral Daily   Continuous Infusions:      Time spent: 35 minutes    [facility]  [facility] Pager [phone] If 7PM-7AM, please contact night-coverage at [email or web address], password [facility] 10/20/2014, 3:07 PM  LOS: 2 days

## 2014-10-21 LAB — BASIC METABOLIC PANEL
Anion gap: 12 (ref 5–15)
BUN: 17 mg/dL (ref 6–20)
CO2: 27 mmol/L (ref 22–32)
Calcium: 8.3 mg/dL — ABNORMAL LOW (ref 8.9–10.3)
Chloride: 97 mmol/L — ABNORMAL LOW (ref 101–111)
Creatinine, Ser: 1.51 mg/dL — ABNORMAL HIGH (ref 0.61–1.24)
GFR calc Af Amer: 46 mL/min — ABNORMAL LOW (ref >60)
GFR, EST NON AFRICAN AMERICAN: 40 mL/min — AB (ref >60)
Glucose, Bld: 117 mg/dL — ABNORMAL HIGH (ref 65–99)
Potassium: 3.3 mmol/L — ABNORMAL LOW (ref 3.5–5.1)
SODIUM: 136 mmol/L (ref 135–145)

## 2014-10-21 LAB — HEMOGLOBIN A1C
Hgb A1c MFr Bld: 6.6 % — ABNORMAL HIGH (ref 4.8–5.6)
MEAN PLASMA GLUCOSE: 143 mg/dL

## 2014-10-21 LAB — GLUCOSE, CAPILLARY
GLUCOSE-CAPILLARY: 152 mg/dL — AB (ref 65–99)
GLUCOSE-CAPILLARY: 125 mg/dL — AB (ref 65–99)
GLUCOSE-CAPILLARY: 114 mg/dL — AB (ref 65–99)
Glucose-Capillary: 150 mg/dL — ABNORMAL HIGH (ref 65–99)
Glucose-Capillary: 124 mg/dL — ABNORMAL HIGH (ref 65–99)
Glucose-Capillary: 156 mg/dL — ABNORMAL HIGH (ref 65–99)

## 2014-10-21 MED ORDER — POTASSIUM CHLORIDE CRYS ER 20 MEQ PO TBCR
40.0000 meq | EXTENDED_RELEASE_TABLET | Freq: Four times a day (QID) | ORAL | Status: AC
  Administered 2014-10-21 (×2): 40 meq via ORAL
  Filled 2014-10-21 (×2): qty 2

## 2014-10-21 NOTE — Progress Notes (Signed)
ANTIBIOTIC CONSULT NOTE - FOLLOW UP  Pharmacy Consult for Vancomycin and Zosyn Indication: Cellulitis  No Known Allergies  Patient Measurements: Height: 5\' 10"  (177.8 cm) Weight: 247 lb 9.6 oz (112.311 kg) IBW/kg (Calculated) : 73  Vital Signs: Temp: 98.2 F (36.8 C) (06/25 0952) Temp Source: Oral (06/25 0952) BP: 118/53 mmHg (06/25 0952) Pulse Rate: 70 (06/25 0952) Intake/Output from previous day: 06/24 0701 - 06/25 0700 In: 1030 [P.O.:680; IV Piggyback:350] Out: 1100 [Urine:1100] Intake/Output from this shift: Total I/O In: 600 [P.O.:600] Out: 450 [Urine:450]  Labs:  Recent Labs  10/18/14 1604 10/18/14 2320 10/19/14 0646 10/20/14 0529 10/21/14 0308  WBC 7.2 8.2 7.3  --   --   HGB 13.1 12.5* 12.1*  --   --   PLT 262 237 227  --   --   CREATININE 1.47* 1.37* 1.46* 1.72* 1.51*   Estimated Creatinine Clearance: 44.1 mL/min (by C-G formula based on Cr of 1.51). No results for input(s): VANCOTROUGH, VANCOPEAK, VANCORANDOM, GENTTROUGH, GENTPEAK, GENTRANDOM, TOBRATROUGH, TOBRAPEAK, TOBRARND, AMIKACINPEAK, AMIKACINTROU, AMIKACIN in the last 72 hours.   Microbiology: Recent Results (from the past 720 hour(s))  Culture, blood (routine x 2)     Status: None (Preliminary result)   Collection Time: 10/18/14  8:25 PM  Result Value Ref Range Status   Specimen Description BLOOD LEFT ANTECUBITAL  Final   Special Requests BOTTLES DRAWN AEROBIC AND ANAEROBIC 5CC  Final   Culture   Final    NO GROWTH 2 DAYS Performed at [facility]    Report Status PENDING  Incomplete  Culture, blood (routine x 2)     Status: None (Preliminary result)   Collection Time: 10/18/14  8:26 PM  Result Value Ref Range Status   Specimen Description BLOOD RIGHT ANTECUBITAL  Final   Special Requests BOTTLES DRAWN AEROBIC AND ANAEROBIC 3ML  Final   Culture   Final    NO GROWTH 2 DAYS Performed at [facility]    Report Status PENDING  Incomplete  MRSA PCR Screening     Status: None    Collection Time: 10/19/14 12:02 AM  Result Value Ref Range Status   MRSA by PCR NEGATIVE NEGATIVE Final    Comment:        The GeneXpert MRSA Assay (FDA approved for NASAL specimens only), is one component of a comprehensive MRSA colonization surveillance program. It is not intended to diagnose MRSA infection nor to guide or monitor treatment for MRSA infections.     Anti-infectives    Start     Dose/Rate Route Frequency Ordered Stop   10/19/14 2000  vancomycin (VANCOCIN) 1,250 mg in sodium chloride 0.9 % 250 mL IVPB     1,250 mg 166.7 mL/hr over 90 Minutes Intravenous Every 24 hours 10/18/14 1951     10/19/14 0600  piperacillin-tazobactam (ZOSYN) IVPB 3.375 g     3.375 g 12.5 mL/hr over 240 Minutes Intravenous Every 8 hours 10/18/14 2304     10/18/14 2315  piperacillin-tazobactam (ZOSYN) IVPB 3.375 g     3.375 g 100 mL/hr over 30 Minutes Intravenous  Once 10/18/14 2304 10/19/14 0028   10/18/14 2000  vancomycin (VANCOCIN) 2,000 mg in sodium chloride 0.9 % 500 mL IVPB     2,000 mg 250 mL/hr over 120 Minutes Intravenous NOW 10/18/14 1951 10/19/14 0700      Assessment: 11 yoM with hx DVT, PVD, and CKD presents with RLE swelling. Preliminary dopplers negative for DVT. Pharmacy consulted to start Vancomycin and Zosyn for  cellulitis. Now day #3 of abx for cellulitis. Afebrile, WBC wnl. SCr down to 1.51, normalized CrCl 87ml/min.   Goal of Therapy:  Vancomycin trough level 10-15 mcg/ml  Plan:  Continue Vancomycin 1250mg  IV q24h Continue Zosyn 3.375g IV q8h Monitor clinical picture, renal function, F/U C&S, VT prn, abx LOT / deescalation  Atarah Cadogan J 10/21/2014,12:05 PM

## 2014-10-21 NOTE — Progress Notes (Signed)
[facility] PROGRESS NOTE   [name] [name] [id number] DOB: Jul 12, 1926 DOA: 10/18/2014 PCP: Alvester Chou, NP  HPI/Subjective: Is better, still weak, asked if he can stay overnight because of his weakness. We'll continue IV antibiotics for today, discharge in a.m.  Assessment/Plan: Principal Problem:   Cellulitis of both lower extremities Active Problems:   Diabetes mellitus   CKD (chronic kidney disease) stage 3, GFR 30-59 ml/min   Hypertension   Diastolic dysfunction   Lower extremity cellulitis    Bilateral lower extremity cellulitis Patient has lower extremity edema, probable lower extremity psoriasis came in with lower extremity weeping fluids. Started on vancomycin and Zosyn, continue antibiotics. Wound nurse consulted, recommended Eucerin cream for dry skin, Xeroform gauze and Ace wraps. Hold on consulting vascular surgery. Continue current management with washer risers and into biotics.  Hypertension Continue medications  Chronic kidney disease stage III Creatinine appears to be at baseline.  History of gout Continue his allopurinol.  Chronic diastolic dysfunction Last LVEF measured 02/14/2013 was 55-60%, a 6 discontinued.  Diabetes mellitus type 2 Hemoglobin A1c checked in June 2014 was 5.8, not on any hypoglycemic medications. Hemoglobin A1c is 6.6, his blood glucose is controlled by diet. I do not see any relationship between diabetes mellitus and his current lower extremity cellulitis.  Hypokalemia Replete with oral supplements.   Code Status: Full Code Family Communication: Plan discussed with the patient. Disposition Plan: Remains inpatient Diet: Diet heart healthy/carb modified Room service appropriate?: Yes; Fluid consistency:: Thin; Fluid restriction:: 1200 mL Fluid  Consultants:  None  Procedures:  None   Antibiotics:  Zosyn and vancomycin    Objective: Filed Vitals:   10/21/14 0952  BP: 118/53  Pulse: 70  Temp: 98.2 F  (36.8 C)  Resp: 18    Intake/Output Summary (Last 24 hours) at 10/21/14 1607 Last data filed at 10/21/14 1400  Gross per 24 hour  Intake   1440 ml  Output   1250 ml  Net    190 ml   Filed Weights   10/18/14 2304 10/19/14 2103 10/20/14 2100  Weight: 110.678 kg (244 lb) 112.855 kg (248 lb 12.8 oz) 112.311 kg (247 lb 9.6 oz)    Exam: General: Alert and awake, oriented x3, not in any acute distress. HEENT: anicteric sclera, pupils reactive to light and accommodation, EOMI CVS: S1-S2 clear, no murmur rubs or gallops Chest: clear to auscultation bilaterally, no wheezing, rales or rhonchi Abdomen: soft nontender, nondistended, normal bowel sounds, no organomegaly Extremities: no cyanosis, clubbing or edema noted bilaterally Neuro: Cranial nerves II-XII intact, no focal neurological deficits  Data Reviewed: Basic Metabolic Panel:  Recent Labs Lab 10/18/14 1604 10/18/14 2320 10/19/14 0646 10/20/14 0529 10/21/14 0308  NA 137  --  136 137 136  K 4.1  --  3.9 3.5 3.3*  CL 99*  --  98* 100* 97*  CO2 28  --  26 28 27   GLUCOSE 137*  --  115* 103* 117*  BUN 29*  --  22* 19 17  CREATININE 1.47* 1.37* 1.46* 1.72* 1.51*  CALCIUM 9.0  --  8.5* 8.2* 8.3*   Liver Function Tests:  Recent Labs Lab 10/18/14 1604 10/19/14 0646  AST 22 24  ALT 11* 10*  ALKPHOS 58 48  BILITOT 1.3* 1.7*  PROT 7.8 6.4*  ALBUMIN 3.4* 2.8*   No results for input(s): LIPASE, AMYLASE in the last 168 hours. No results for input(s): AMMONIA in the last 168 hours. CBC:  Recent Labs Lab 10/18/14 1604 10/18/14 2320  10/19/14 0646  WBC 7.2 8.2 7.3  NEUTROABS 5.5  --  5.3  HGB 13.1 12.5* 12.1*  HCT 39.2 37.0* 36.1*  MCV 97.0 94.9 95.8  PLT 262 237 227   Cardiac Enzymes: No results for input(s): CKTOTAL, CKMB, CKMBINDEX, TROPONINI in the last 168 hours. BNP (last 3 results)  Recent Labs  10/18/14 2320  BNP 60.5    ProBNP (last 3 results) No results for input(s): PROBNP in the last 8760  hours.  CBG:  Recent Labs Lab 10/20/14 1149 10/20/14 1831 10/20/14 2138 10/21/14 0744 10/21/14 1130  GLUCAP 144* 152* 125* 150* 114*    Micro Recent Results (from the past 240 hour(s))  Culture, blood (routine x 2)     Status: None (Preliminary result)   Collection Time: 10/18/14  8:25 PM  Result Value Ref Range Status   Specimen Description BLOOD LEFT ANTECUBITAL  Final   Special Requests BOTTLES DRAWN AEROBIC AND ANAEROBIC 5CC  Final   Culture   Final    NO GROWTH 3 DAYS Performed at [facility]    Report Status PENDING  Incomplete  Culture, blood (routine x 2)     Status: None (Preliminary result)   Collection Time: 10/18/14  8:26 PM  Result Value Ref Range Status   Specimen Description BLOOD RIGHT ANTECUBITAL  Final   Special Requests BOTTLES DRAWN AEROBIC AND ANAEROBIC 3ML  Final   Culture   Final    NO GROWTH 3 DAYS Performed at [facility]    Report Status PENDING  Incomplete  MRSA PCR Screening     Status: None   Collection Time: 10/19/14 12:02 AM  Result Value Ref Range Status   MRSA by PCR NEGATIVE NEGATIVE Final    Comment:        The GeneXpert MRSA Assay (FDA approved for NASAL specimens only), is one component of a comprehensive MRSA colonization surveillance program. It is not intended to diagnose MRSA infection nor to guide or monitor treatment for MRSA infections.      Studies: No results found.  Scheduled Meds: . allopurinol  300 mg Oral Daily  . enoxaparin (LOVENOX) injection  40 mg Subcutaneous Q24H  . hydrocerin   Topical Daily  . insulin aspart  0-9 Units Subcutaneous TID WC  . piperacillin-tazobactam (ZOSYN)  IV  3.375 g Intravenous Q8H  . potassium chloride  40 mEq Oral Q6H  . sodium chloride  3 mL Intravenous Q12H  . vancomycin  1,250 mg Intravenous Q24H  . verapamil  240 mg Oral Daily   Continuous Infusions:      Time spent: 35 minutes    [facility] A  [facility] Pager [phone] If 7PM-7AM,  please contact night-coverage at [email or web address], password [email or web address] 10/21/2014, 4:07 PM  LOS: 3 days

## 2014-10-22 ENCOUNTER — Ambulatory Visit (HOSPITAL_COMMUNITY): Payer: Medicare Other

## 2014-10-22 ENCOUNTER — Encounter (HOSPITAL_COMMUNITY): Payer: Medicare Other

## 2014-10-22 LAB — BASIC METABOLIC PANEL
Anion gap: 8 (ref 5–15)
BUN: 14 mg/dL (ref 6–20)
CO2: 27 mmol/L (ref 22–32)
Calcium: 8.5 mg/dL — ABNORMAL LOW (ref 8.9–10.3)
Chloride: 102 mmol/L (ref 101–111)
Creatinine, Ser: 1.41 mg/dL — ABNORMAL HIGH (ref 0.61–1.24)
GFR calc Af Amer: 50 mL/min — ABNORMAL LOW (ref >60)
GFR calc non Af Amer: 44 mL/min — ABNORMAL LOW (ref >60)
Glucose, Bld: 105 mg/dL — ABNORMAL HIGH (ref 65–99)
Potassium: 4.1 mmol/L (ref 3.5–5.1)
Sodium: 137 mmol/L (ref 135–145)

## 2014-10-22 LAB — GLUCOSE, CAPILLARY
Glucose-Capillary: 114 mg/dL — ABNORMAL HIGH (ref 65–99)
Glucose-Capillary: 126 mg/dL — ABNORMAL HIGH (ref 65–99)

## 2014-10-22 MED ORDER — CEPHALEXIN 500 MG PO CAPS: 500.0000 mg | ORAL_CAPSULE | Freq: Three times a day (TID) | ORAL | Status: DC

## 2014-10-22 MED ORDER — DOXYCYCLINE HYCLATE 100 MG PO TABS: 100.0000 mg | ORAL_TABLET | Freq: Two times a day (BID) | ORAL | Status: DC

## 2014-10-22 NOTE — Progress Notes (Signed)
Patient to be discharged to home. Discharge instructions and new medications reviewed with patient's daughter. PIV removed. Patient's belongings - including cell phone, clothing, and personal walker - all returned to patient.  Joellen Jersey, RN.

## 2014-10-22 NOTE — Discharge Summary (Signed)
Physician Discharge Summary  [name] [name] [id number] DOB: August 13, 1926 DOA: 10/18/2014  PCP: Alvester Chou, NP  Admit date: 10/18/2014 Discharge date: 10/22/2014  Time spent: 40 minutes  Recommendations for Outpatient Follow-up:  1. Follow-up with primary care physician within one week. 2. Follow-up with wound Center in [place].  Discharge Diagnoses:  Principal Problem:   Cellulitis of both lower extremities Active Problems:   Diabetes mellitus   CKD (chronic kidney disease) stage 3, GFR 30-59 ml/min   Hypertension   Diastolic dysfunction   Lower extremity cellulitis   Discharge Condition: Stable  Diet recommendation: Heart healthy  Filed Weights   10/19/14 2103 10/20/14 2100 10/21/14 2101  Weight: 112.855 kg (248 lb 12.8 oz) 112.311 kg (247 lb 9.6 oz) 110.224 kg (243 lb)    History of present illness:  [name] [name] is a 81 y.o. male with history of hypertension, chronic kidney disease, gout, diastolic dysfunction was brought to the ER after patient was having increasing swelling with increasing fluid draining from the legs and pain over the last 1 week. Patient states over the last one the patient's legs have become increasingly edematous with pain and fluid seeping. Most of the swelling is below the knee bilaterally. Patient had some subjective feeling of fever or chills. Denies any trauma. Dopplers done in the ER was negative for DVT as per the ER physician. Patient has been admitted for cellulitis of the lower extremity. Patient does have a history of peripheral arterial disease followed by Dr. Bridgett Larsson. At this time extremities are warm to touch. Patient denies any shortness of breath or chest pain.    Hospital Course:   Bilateral lower extremity cellulitis Patient has lower extremity edema, probable lower extremity psoriasis came in with legs weeping fluids. Started on vancomycin and Zosyn. Wound nurse consulted, recommended Eucerin cream for dry skin, Xeroform gauze and Ace  wraps. Referral made for outpatient wound clinic. Patient discharged home on doxycycline and Keflex for 7 more days. Continue Eucerin cream, Xeroform gauze and Ace wraps at home.  Hypertension Continue medications  Chronic kidney disease stage III Creatinine appears to be at baseline of 1.4 on discharge.  History of gout Continue his allopurinol.  Chronic diastolic dysfunction Last LVEF measured 02/14/2013 was 55-60%, a 6 discontinued.  Diabetes mellitus type 2 Hemoglobin A1c checked in June 2014 was 5.8, not on any hypoglycemic medications. Hemoglobin A1c is 6.6, his blood glucose is controlled by diet. I do not see any relationship between diabetes mellitus and his current lower extremity cellulitis.  Hypokalemia Repletee with oral supplements, likely secondary to diuresis.    Procedures:  None  Consultations:  [place] team  Discharge Exam: Filed Vitals:   10/22/14 1000  BP: 116/64  Pulse: 64  Temp: 98.7 F (37.1 C)  Resp: 18   General: Alert and awake, oriented x3, not in any acute distress. HEENT: anicteric sclera, pupils reactive to light and accommodation, EOMI CVS: S1-S2 clear, no murmur rubs or gallops Chest: clear to auscultation bilaterally, no wheezing, rales or rhonchi Abdomen: soft nontender, nondistended, normal bowel sounds, no organomegaly Extremities: no cyanosis, legs wrapped with Ace wraps bilaterally. Neuro: Cranial nerves II-XII intact, no focal neurological deficits   Discharge Instructions   Discharge Instructions    Diet - low sodium heart healthy    Complete by:  As directed      Increase activity slowly    Complete by:  As directed           Current Discharge Medication List  START taking these medications   Details  cephALEXin (KEFLEX) 500 MG capsule Take 1 capsule (500 mg total) by mouth 3 (three) times daily. Qty: 21 capsule, Refills: 0    doxycycline (VIBRA-TABS) 100 MG tablet Take 1 tablet (100 mg total) by mouth 2  (two) times daily. Qty: 14 tablet, Refills: 0      CONTINUE these medications which have NOT CHANGED   Details  allopurinol (ZYLOPRIM) 300 MG tablet Take 300 mg by mouth daily.    furosemide (LASIX) 40 MG tablet Take 40 mg by mouth daily.    losartan (COZAAR) 25 MG tablet Take 25 mg by mouth daily.    verapamil (CALAN-SR) 240 MG CR tablet Take 240 mg by mouth daily.        No Known Allergies Follow-up Information    Please follow up.   Why:  [facility] , October 31, 2014 @ 10:15 am       Follow up with Alvester Chou, NP In 1 week.   Specialty:  Nurse Practitioner   Contact information:   [facility] [place]  [id number] [phone]        The results of significant diagnostics from this hospitalization (including imaging, microbiology, ancillary and laboratory) are listed below for reference.    Significant Diagnostic Studies: Dg Chest 1 View  10/18/2014   CLINICAL DATA:  Renal insufficiency and lower extremity edema  EXAM: CHEST  1 VIEW  COMPARISON:  October 15, 2012  FINDINGS: There is no edema or consolidation. Heart is upper normal in size with pulmonary vascularity within normal limits. No adenopathy. No bone lesions.  IMPRESSION: No edema or consolidation.   Electronically Signed   By: Lowella Grip III M.D.   On: 10/18/2014 21:05   Dg Tibia/fibula Left  10/18/2014   CLINICAL DATA:  Cellulitis and chronic renal insufficiency. Soft tissue swelling.  EXAM: LEFT TIBIA AND FIBULA - 2 VIEW  COMPARISON:  November 15, 2012  FINDINGS: Frontal and lateral views obtained. There is no fracture or dislocation. No erosive change or bony destruction. There are multiple foci of arterial vascular calcification. There is osteoarthritic change in the knee and ankle joints.  IMPRESSION: No fracture or dislocation. No erosive change. No blastic or lytic bone lesions. Osteoarthritic changes noted in the knee and ankle regions. There are  multiple foci of arterial vascular calcification.   Electronically Signed   By: Lowella Grip III M.D.   On: 10/18/2014 20:56   Dg Tibia/fibula Right  10/18/2014   CLINICAL DATA:  Soft tissue swelling and cellulitis  EXAM: RIGHT TIBIA AND FIBULA - 2 VIEW  COMPARISON:  August 05, 2012  FINDINGS: Frontal and lateral views obtained. No fracture or dislocation. No erosive change or bony destruction. There is osteoarthritic change in the knee and ankle regions. There are foci of arterial vascular calcification.  IMPRESSION: No fracture or dislocation. No blastic or lytic bone lesions. No erosive change or bony destruction. Multiple foci of arterial vascular calcification.   Electronically Signed   By: Lowella Grip III M.D.   On: 10/18/2014 20:57    Microbiology: Recent Results (from the past 240 hour(s))  Culture, blood (routine x 2)     Status: None (Preliminary result)   Collection Time: 10/18/14  8:25 PM  Result Value Ref Range Status   Specimen Description BLOOD LEFT ANTECUBITAL  Final   Special Requests BOTTLES DRAWN AEROBIC AND ANAEROBIC 5CC  Final  Culture   Final    NO GROWTH 3 DAYS Performed at [facility] [place]    Report Status PENDING  Incomplete  Culture, blood (routine x 2)     Status: None (Preliminary result)   Collection Time: 10/18/14  8:26 PM  Result Value Ref Range Status   Specimen Description BLOOD RIGHT ANTECUBITAL  Final   Special Requests BOTTLES DRAWN AEROBIC AND ANAEROBIC 3ML  Final   Culture   Final    NO GROWTH 3 DAYS Performed at [facility]    Report Status PENDING  Incomplete  MRSA PCR Screening     Status: None   Collection Time: 10/19/14 12:02 AM  Result Value Ref Range Status   MRSA by PCR NEGATIVE NEGATIVE Final    Comment:        The GeneXpert MRSA Assay (FDA approved for NASAL specimens only), is one component of a comprehensive MRSA colonization surveillance program. It is not intended to diagnose MRSA infection nor to guide  or monitor treatment for MRSA infections.      Labs: Basic Metabolic Panel:  Recent Labs Lab 10/18/14 1604 10/18/14 2320 10/19/14 0646 10/20/14 0529 10/21/14 0308 10/22/14 0354  NA 137  --  136 137 136 137  K 4.1  --  3.9 3.5 3.3* 4.1  CL 99*  --  98* 100* 97* 102  CO2 28  --  26 28 27 27   GLUCOSE 137*  --  115* 103* 117* 105*  BUN 29*  --  22* 19 17 14   CREATININE 1.47* 1.37* 1.46* 1.72* 1.51* 1.41*  CALCIUM 9.0  --  8.5* 8.2* 8.3* 8.5*   Liver Function Tests:  Recent Labs Lab 10/18/14 1604 10/19/14 0646  AST 22 24  ALT 11* 10*  ALKPHOS 58 48  BILITOT 1.3* 1.7*  PROT 7.8 6.4*  ALBUMIN 3.4* 2.8*   No results for input(s): LIPASE, AMYLASE in the last 168 hours. No results for input(s): AMMONIA in the last 168 hours. CBC:  Recent Labs Lab 10/18/14 1604 10/18/14 2320 10/19/14 0646  WBC 7.2 8.2 7.3  NEUTROABS 5.5  --  5.3  HGB 13.1 12.5* 12.1*  HCT 39.2 37.0* 36.1*  MCV 97.0 94.9 95.8  PLT 262 237 227   Cardiac Enzymes: No results for input(s): CKTOTAL, CKMB, CKMBINDEX, TROPONINI in the last 168 hours. BNP: BNP (last 3 results)  Recent Labs  10/18/14 2320  BNP 60.5    ProBNP (last 3 results) No results for input(s): PROBNP in the last 8760 hours.  CBG:  Recent Labs Lab 10/21/14 0744 10/21/14 1130 10/21/14 1645 10/21/14 2056 10/22/14 0750  GLUCAP 150* 114* 124* 156* 114*       Signed:  Jelan Batterton A  [facility] 10/22/2014, 11:00 AM

## 2014-10-23 LAB — CULTURE, BLOOD (ROUTINE X 2)
CULTURE: NO GROWTH
Culture: NO GROWTH

## 2014-10-23 LAB — GLUCOSE, CAPILLARY: GLUCOSE-CAPILLARY: 165 mg/dL — AB (ref 65–99)

## 2014-10-23 LAB — HEMOGLOBIN A1C
HEMOGLOBIN A1C: 6.6 % — AB (ref 4.8–5.6)
MEAN PLASMA GLUCOSE: 143 mg/dL

## 2014-10-31 ENCOUNTER — Ambulatory Visit: Payer: Medicare Other | Admitting: Surgery

## 2014-10-31 ENCOUNTER — Encounter: Payer: Self-pay | Admitting: Vascular Surgery

## 2014-11-03 ENCOUNTER — Ambulatory Visit: Payer: Medicare Other | Admitting: Vascular Surgery

## 2014-11-03 ENCOUNTER — Encounter (HOSPITAL_COMMUNITY): Payer: Medicare Other

## 2014-11-06 ENCOUNTER — Encounter: Payer: Medicare Other | Attending: Surgery | Admitting: Surgery

## 2014-11-06 DIAGNOSIS — Z87891 Personal history of nicotine dependence: Secondary | ICD-10-CM | POA: Diagnosis not present

## 2014-11-06 DIAGNOSIS — I890 Lymphedema, not elsewhere classified: Secondary | ICD-10-CM | POA: Insufficient documentation

## 2014-11-06 DIAGNOSIS — I87331 Chronic venous hypertension (idiopathic) with ulcer and inflammation of right lower extremity: Secondary | ICD-10-CM | POA: Diagnosis not present

## 2014-11-06 DIAGNOSIS — L97919 Non-pressure chronic ulcer of unspecified part of right lower leg with unspecified severity: Secondary | ICD-10-CM | POA: Diagnosis not present

## 2014-11-06 DIAGNOSIS — I87332 Chronic venous hypertension (idiopathic) with ulcer and inflammation of left lower extremity: Secondary | ICD-10-CM | POA: Diagnosis not present

## 2014-11-06 DIAGNOSIS — E11622 Type 2 diabetes mellitus with other skin ulcer: Secondary | ICD-10-CM | POA: Diagnosis not present

## 2014-11-06 DIAGNOSIS — I509 Heart failure, unspecified: Secondary | ICD-10-CM | POA: Insufficient documentation

## 2014-11-06 DIAGNOSIS — I70238 Atherosclerosis of native arteries of right leg with ulceration of other part of lower right leg: Secondary | ICD-10-CM | POA: Insufficient documentation

## 2014-11-06 DIAGNOSIS — L97929 Non-pressure chronic ulcer of unspecified part of left lower leg with unspecified severity: Secondary | ICD-10-CM | POA: Insufficient documentation

## 2014-11-06 DIAGNOSIS — I70248 Atherosclerosis of native arteries of left leg with ulceration of other part of lower left leg: Secondary | ICD-10-CM | POA: Insufficient documentation

## 2014-11-06 DIAGNOSIS — Z992 Dependence on renal dialysis: Secondary | ICD-10-CM | POA: Diagnosis not present

## 2014-11-06 DIAGNOSIS — I129 Hypertensive chronic kidney disease with stage 1 through stage 4 chronic kidney disease, or unspecified chronic kidney disease: Secondary | ICD-10-CM | POA: Insufficient documentation

## 2014-11-07 NOTE — Progress Notes (Signed)
BRANON, PETROSINO ([id number]) Visit Report for 11/06/2014 Abuse/Suicide Risk Screen Details Patient Name: [name] [name], [name] [name] Date of Service: 11/06/2014 1:00 PM Medical Record Number: [id number] Patient Account Number: [id number] Date of Birth/Sex: [birth year] (81 y.o. Male) Treating RN: Montey Hora Primary Care Physician: Alvester Chou Other Clinician: Referring Physician: Gean Birchwood Treating Physician/Extender: Frann Rider in Treatment: 0 Abuse/Suicide Risk Screen Items Answer ABUSE/SUICIDE RISK SCREEN: Has anyone close to you tried to hurt or harm you recentlyo No Do you feel uncomfortable with anyone in your familyo No Has anyone forced you do things that you didnot want to doo No Do you have any thoughts of harming yourselfo No Patient displays signs or symptoms of abuse and/or neglect. No Electronic Signature(s) Signed: 11/06/2014 5:31:46 PM By: Montey Hora Entered By: Montey Hora on 11/06/2014 13:35:39 [name] [name] ([id number]) -------------------------------------------------------------------------------- Activities of Daily Living Details Patient Name: [name] [name] Date of Service: 11/06/2014 1:00 PM Medical Record Number: [id number] Patient Account Number: [id number] Date of Birth/Sex: 1926-08-16 (81 y.o. Male) Treating RN: Montey Hora Primary Care Physician: Alvester Chou Other Clinician: Referring Physician: Gean Birchwood Treating Physician/Extender: Frann Rider in Treatment: 0 Activities of Daily Living Items Answer Activities of Daily Living (Please select one for each item) Drive Automobile Completely Able Take Medications Completely Able Use Telephone Completely Able Care for Appearance Completely Able Use Toilet Completely Able Bath / Shower Completely Able Dress Self Completely Able Feed Self Completely Able Walk Completely Able Get In / Out Bed Completely Able Housework Completely Able Prepare Meals Completely [place] for Self Completely Able Electronic Signature(s) Signed: 11/06/2014 5:31:46 PM By: Montey Hora Entered By: Montey Hora on 11/06/2014 13:36:00 [name] [name] ([id number]) -------------------------------------------------------------------------------- Education Assessment Details Patient Name: [name] [name] Date of Service: 11/06/2014 1:00 PM Medical Record Number: [id number] Patient Account Number: [id number] Date of Birth/Sex: 1926-10-23 (81 y.o. Male) Treating RN: Montey Hora Primary Care Physician: Alvester Chou Other Clinician: Referring Physician: Gean Birchwood Treating Physician/Extender: Frann Rider in Treatment: 0 Primary Learner Assessed: Patient Learning Preferences/Education Level/Primary Language Learning Preference: Explanation, Demonstration Highest Education Level: High School Preferred Language: English Cognitive Barrier Assessment/Beliefs Language Barrier: No Translator Needed: No Memory Deficit: No Emotional Barrier: No Cultural/Religious Beliefs Affecting Medical No Care: Physical Barrier Assessment Impaired Vision: No Impaired Hearing: No Decreased Hand dexterity: No Knowledge/Comprehension Assessment Knowledge Level: Low Comprehension Level: Low Ability to understand written Low instructions: Ability to understand verbal Medium instructions: Motivation Assessment Anxiety Level: Calm Cooperation: Cooperative Education Importance: Acknowledges Need Interest in Health Problems: Asks Questions Perception: Coherent Willingness to Engage in Self- Medium Management Activities: Readiness to Engage in Self- Medium Management Activities: Electronic Signature(s) [name] [name], [name] [name] ([id number]) Signed: 11/06/2014 5:31:46 PM By: Montey Hora Entered By: Montey Hora on 11/06/2014 13:36:29 [name] [name] ([id number]) -------------------------------------------------------------------------------- Fall Risk Assessment  Details Patient Name: [name] [name] Date of Service: 11/06/2014 1:00 PM Medical Record Number: [id number] Patient Account Number: [id number] Date of Birth/Sex: [birth year] (81 y.o. Male) Treating RN: Montey Hora Primary Care Physician: Alvester Chou Other Clinician: Referring Physician: Gean Birchwood Treating Physician/Extender: Frann Rider in Treatment: 0 Fall Risk Assessment Items FALL RISK ASSESSMENT: History of falling - immediate or within 3 months 0 No Secondary diagnosis 0 No Ambulatory aid None/bed rest/wheelchair/nurse 0 Yes Crutches/cane/walker 0 No Furniture 0 No IV Access/Saline Lock 0 No Gait/Training Normal/bed rest/immobile 0 No Weak 10 Yes Impaired 0 No Mental Status Oriented to own ability 0 Yes Electronic Signature(s) Signed: 11/06/2014 5:31:46 PM By: Montey Hora Entered By: Montey Hora on  11/06/2014 13:36:47 [name] [name], [name] [name] ([id number]) -------------------------------------------------------------------------------- Nutrition Risk Assessment Details Patient Name: [name] [name], [name] [name] Date of Service: 11/06/2014 1:00 PM Medical Record Number: [id number] Patient Account Number: [id number] Date of Birth/Sex: April 24, 1927 (81 y.o. Male) Treating RN: Montey Hora Primary Care Physician: Alvester Chou Other Clinician: Referring Physician: Gean Birchwood Treating Physician/Extender: Frann Rider in Treatment: 0 Height (in): 70 Weight (lbs): 244 Body Mass Index (BMI): 35 Nutrition Risk Assessment Items NUTRITION RISK SCREEN: I have an illness or condition that made me change the kind and/or 0 No amount of food I eat I eat fewer than two meals per day 0 No I eat few fruits and vegetables, or milk products 0 No I have three or more drinks of beer, liquor or wine almost every day 0 No I have tooth or mouth problems that make it hard for me to eat 0 No I don't always have enough money to buy the food I need 0 No I eat alone most of the time 0 No I  take three or more different prescribed or over-the-counter drugs a 1 Yes day Without wanting to, I have lost or gained 10 pounds in the last six 0 No months I am not always physically able to shop, cook and/or feed myself 0 No Nutrition Protocols Good Risk Protocol Moderate Risk Protocol Electronic Signature(s) Signed: 11/06/2014 5:31:46 PM By: Montey Hora Entered By: Montey Hora on 11/06/2014 13:36:52

## 2014-11-07 NOTE — Progress Notes (Addendum)
[name] [name], [name] [name] ([id number]) Visit Report for 11/06/2014 Allergy List Details Patient Name: [name] [name], [name] [name] Date of Service: 11/06/2014 1:00 PM Medical Record Number: [id number] Patient Account Number: [id number] Date of Birth/Sex: 03/20/1927 (81 y.o. Male) Treating RN: Montey Hora Primary Care Physician: Alvester Chou Other Clinician: Referring Physician: Gean Birchwood Treating Physician/Extender: Frann Rider in Treatment: 0 Allergies Active Allergies No Known Allergies Allergy Notes Electronic Signature(s) Signed: 11/06/2014 2:11:23 PM By: Montey Hora Entered By: Montey Hora on 11/06/2014 14:11:23 [name] [name] ([id number]) -------------------------------------------------------------------------------- Arrival Information Details Patient Name: [name] [name] Date of Service: 11/06/2014 1:00 PM Medical Record Number: [id number] Patient Account Number: [id number] Date of Birth/Sex: [birth year] (81 y.o. Male) Treating RN: Montey Hora Primary Care Physician: Alvester Chou Other Clinician: Referring Physician: Gean Birchwood Treating Physician/Extender: Frann Rider in Treatment: 0 Visit Information Patient Arrived: Charlyn Minerva Time: 13:26 Accompanied By: self Transfer Assistance: None Patient Identification Verified: Yes Secondary Verification Process Yes Completed: Patient Has Alerts: Yes Patient Alerts: DMII ABI Obetz bilateral Electronic Signature(s) Signed: 11/06/2014 5:31:46 PM By: Montey Hora Entered By: Montey Hora on 11/06/2014 14:15:57 [name] [name] ([id number]) -------------------------------------------------------------------------------- Clinic Level of Care Assessment Details Patient Name: [name] [name] Date of Service: 11/06/2014 1:00 PM Medical Record Number: [id number] Patient Account Number: [id number] Date of Birth/Sex: Feb 01, 1927 (81 y.o. Male) Treating RN: Montey Hora Primary Care Physician: Alvester Chou Other  Clinician: Referring Physician: Gean Birchwood Treating Physician/Extender: Frann Rider in Treatment: 0 Clinic Level of Care Assessment Items TOOL 2 Quantity Score []  - Use when only an EandM is performed on the INITIAL visit 0 ASSESSMENTS - Nursing Assessment / Reassessment X - General Physical Exam (combine w/ comprehensive assessment (listed just 1 20 below) when performed on new pt. evals) X - Comprehensive Assessment (HX, ROS, Risk Assessments, Wounds Hx, etc.) 1 25 ASSESSMENTS - Wound and Skin Assessment / Reassessment []  - Simple Wound Assessment / Reassessment - one wound 0 []  - Complex Wound Assessment / Reassessment - multiple wounds 0 X - Dermatologic / Skin Assessment (not related to wound area) 1 10 ASSESSMENTS - Ostomy and/or Continence Assessment and Care []  - Incontinence Assessment and Management 0 []  - Ostomy Care Assessment and Management (repouching, etc.) 0 PROCESS - Coordination of Care X - Simple Patient / Family Education for ongoing care 1 15 []  - Complex (extensive) Patient / Family Education for ongoing care 0 X - Staff obtains Programmer, systems, Records, Test Results / Process Orders 1 10 []  - Staff telephones HHA, Nursing Homes / Clarify orders / etc 0 []  - Routine Transfer to another Facility (non-emergent condition) 0 []  - Routine Hospital Admission (non-emergent condition) 0 X - New Admissions / Biomedical engineer / Ordering NPWT, Apligraf, etc. 1 15 []  - Emergency Hospital Admission (emergent condition) 0 X - Simple Discharge Coordination 1 10 [name] [name], [name] [name] ([id number]) []  - Complex (extensive) Discharge Coordination 0 PROCESS - Special Needs []  - Pediatric / Minor Patient Management 0 []  - Isolation Patient Management 0 []  - Hearing / Language / Visual special needs 0 []  - Assessment of Community assistance (transportation, D/C planning, etc.) 0 []  - Additional assistance / Altered mentation 0 []  - Support Surface(s) Assessment (bed,  cushion, seat, etc.) 0 INTERVENTIONS - Wound Cleansing / Measurement []  - Wound Imaging (photographs - any number of wounds) 0 []  - Wound Tracing (instead of photographs) 0 []  - Simple Wound Measurement - one wound 0 []  - Complex Wound Measurement - multiple wounds 0 []  - Simple Wound Cleansing - one wound 0 []  -  Complex Wound Cleansing - multiple wounds 0 INTERVENTIONS - Wound Dressings []  - Small Wound Dressing one or multiple wounds 0 []  - Medium Wound Dressing one or multiple wounds 0 []  - Large Wound Dressing one or multiple wounds 0 []  - Application of Medications - injection 0 INTERVENTIONS - Miscellaneous []  - External ear exam 0 []  - Specimen Collection (cultures, biopsies, blood, body fluids, etc.) 0 []  - Specimen(s) / Culture(s) sent or taken to Lab for analysis 0 []  - Patient Transfer (multiple staff / Harrel Lemon Lift / Similar devices) 0 []  - Simple Staple / Suture removal (25 or less) 0 []  - Complex Staple / Suture removal (26 or more) 0 [name] [name], [name] [name] ([id number]) []  - Hypo / Hyperglycemic Management (close monitor of Blood Glucose) 0 X - Ankle / Brachial Index (ABI) - do not check if billed separately 1 15 Has the patient been seen at the hospital within the last three years: Yes Total Score: 120 Level Of Care: New/Established - Level 4 Electronic Signature(s) Signed: 11/06/2014 4:58:31 PM By: Montey Hora Entered By: Montey Hora on 11/06/2014 16:58:31 [name] [name] ([id number]) -------------------------------------------------------------------------------- Encounter Discharge Information Details Patient Name: [name] [name] Date of Service: 11/06/2014 1:00 PM Medical Record Number: [id number] Patient Account Number: [id number] Date of Birth/Sex: Sep 09, 1926 (81 y.o. Male) Treating RN: Montey Hora Primary Care Physician: Alvester Chou Other Clinician: Referring Physician: Gean Birchwood Treating Physician/Extender: Frann Rider in Treatment: 0 Encounter  Discharge Information Items Discharge Pain Level: 0 Discharge Condition: Stable Ambulatory Status: Ambulatory Discharge Destination: Home Private Transportation: Auto Accompanied By: self Schedule Follow-up Appointment: Yes Medication Reconciliation completed and No provided to Patient/Care [name] [name]: Clinical Summary of Care: Electronic Signature(s) Signed: 11/06/2014 5:31:46 PM By: Montey Hora Entered By: Montey Hora on 11/06/2014 14:45:03 [name] [name] ([id number]) -------------------------------------------------------------------------------- General Visit Notes Details Patient Name: [name] [name] Date of Service: 11/06/2014 1:00 PM Medical Record Number: [id number] Patient Account Number: [id number] Date of Birth/Sex: 10-09-1926 (81 y.o. Male) Treating RN: Montey Hora Primary Care Physician: Alvester Chou Other Clinician: Referring Physician: Gean Birchwood Treating Physician/Extender: Frann Rider in Treatment: 0 Notes bilateral lower legs treated as follows for lymphedema - cleansed with antibacterial soap and water, lotion applied, contact layer applied and 2 layer lite wrap applied to bilateral lower legs Electronic Signature(s) Signed: 11/06/2014 4:56:21 PM By: Montey Hora Entered By: Montey Hora on 11/06/2014 16:56:21 [name] [name] ([id number]) -------------------------------------------------------------------------------- Lower Extremity Assessment Details Patient Name: [name] [name] Date of Service: 11/06/2014 1:00 PM Medical Record Number: [id number] Patient Account Number: [id number] Date of Birth/Sex: 1926-05-10 (81 y.o. Male) Treating RN: Montey Hora Primary Care Physician: Alvester Chou Other Clinician: Referring Physician: Gean Birchwood Treating Physician/Extender: Frann Rider in Treatment: 0 Edema Assessment Assessed: [Left: No] [Right: No] Edema: [Left: Yes] [Right: Yes] Calf Left: Right: Point of Measurement: 35 cm  From Medial Instep 37.5 cm 46 cm Ankle Left: Right: Point of Measurement: 11 cm From Medial Instep 25.1 cm 26.7 cm Vascular Assessment Pulses: Posterior Tibial Palpable: [Left:Yes] [Right:Yes] Dorsalis Pedis Palpable: [Left:Yes] [Right:Yes] Extremity colors, hair growth, and conditions: Extremity Color: [Left:Hyperpigmented] [Right:Hyperpigmented] Hair Growth on Extremity: [Left:No] [Right:No] Temperature of Extremity: [Left:Warm] [Right:Warm] Capillary Refill: [Left:< 3 seconds] [Right:< 3 seconds] Toe Nail Assessment Left: Right: Thick: Yes Yes Discolored: Yes Yes Deformed: Yes Yes Improper Length and Hygiene: Yes Yes Electronic Signature(s) Signed: 11/06/2014 5:31:46 PM By: Montey Hora Entered By: Montey Hora on 11/06/2014 13:45:17 [name] [name], [name] [name] ([id number]) [name] [name] ([id number]) -------------------------------------------------------------------------------- Multi Wound Chart Details Patient Name: [name] [name] Date of Service: 11/06/2014  1:00 PM Medical Record Number: [id number] Patient Account Number: [id number] Date of Birth/Sex: [birth year] (81 y.o. Male) Treating RN: Montey Hora Primary Care Physician: Alvester Chou Other Clinician: Referring Physician: Gean Birchwood Treating Physician/Extender: Frann Rider in Treatment: 0 Vital Signs Height(in): 70 Pulse(bpm): 65 Weight(lbs): 244 Blood Pressure 124/65 (mmHg): Body Mass Index(BMI): 35 Temperature(F): 98.2 Respiratory Rate 18 (breaths/min): Wound Assessments Treatment Notes Electronic Signature(s) Signed: 11/06/2014 4:57:59 PM By: Montey Hora Entered By: Montey Hora on 11/06/2014 16:57:58 [name] [name] ([id number]) -------------------------------------------------------------------------------- Multi-Disciplinary Care Plan Details Patient Name: [name] [name] Date of Service: 11/06/2014 1:00 PM Medical Record Number: [id number] Patient Account Number: [id number] Date of Birth/Sex:  1927/04/06 (81 y.o. Male) Treating RN: Montey Hora Primary Care Physician: Alvester Chou Other Clinician: Referring Physician: Gean Birchwood Treating Physician/Extender: Frann Rider in Treatment: 0 Active Inactive Electronic Signature(s) Signed: 12/06/2014 2:39:43 PM By: Montey Hora Signed: 12/07/2014 8:27:50 AM By: Gretta Cool RN, BSN, Kim RN, BSN Previous Signature: 11/06/2014 4:57:22 PM Version By: Montey Hora Entered By: Gretta Cool RN, BSN, Kim on 12/05/2014 15:38:33 [name] [name] ([id number]) -------------------------------------------------------------------------------- Non-Wound Condition Assessment Details Patient Name: [name] [name] Date of Service: 11/06/2014 1:00 PM Medical Record Number: [id number] Patient Account Number: [id number] Date of Birth/Sex: July 08, 1926 (81 y.o. Male) Treating RN: Montey Hora Primary Care Physician: Alvester Chou Other Clinician: Referring Physician: Gean Birchwood Treating Physician/Extender: Frann Rider in Treatment: 0 Non-Wound Condition: Condition: Lymphedema Location: Leg Side: Bilateral Periwound Skin Texture Texture Color No Abnormalities Noted: No No Abnormalities Noted: No Callus: No Atrophie Blanche: No Crepitus: No Cyanosis: No Excoriation: No Ecchymosis: No Fluctuance: No Erythema: No Friable: No Hemosiderin Staining: Yes Induration: No Mottled: No Localized Edema: No Pallor: No Rash: No Rubor: No Scarring: No Temperature / Pain Moisture Temperature: No Abnormality No Abnormalities Noted: No Dry / Scaly: No Maceration: No Moist: Yes Electronic Signature(s) Signed: 11/06/2014 5:31:46 PM By: Montey Hora Entered By: Montey Hora on 11/06/2014 14:21:58 [name] [name] ([id number]) -------------------------------------------------------------------------------- Patient/Caregiver Education Details Patient Name: [name] [name] Date of Service: 11/06/2014 1:00 PM Medical Record Number:  [id number] Patient Account Number: [id number] Date of Birth/Gender: 1927-02-28 (81 y.o. Male) Treating RN: Montey Hora Primary Care Physician: Alvester Chou Other Clinician: Referring Physician: Gean Birchwood Treating Physician/Extender: Frann Rider in Treatment: 0 Education Assessment Education Provided To: Patient Education Topics Provided Venous: Handouts: Other: safety with compression wrap Methods: Demonstration, Explain/Verbal Responses: State content correctly Electronic Signature(s) Signed: 11/06/2014 5:31:46 PM By: Montey Hora Entered By: Montey Hora on 11/06/2014 14:46:31 [name] [name] ([id number]) -------------------------------------------------------------------------------- Vitals Details Patient Name: [name] [name] Date of Service: 11/06/2014 1:00 PM Medical Record Number: [id number] Patient Account Number: [id number] Date of Birth/Sex: [birth year] (81 y.o. Male) Treating RN: Montey Hora Primary Care Physician: Alvester Chou Other Clinician: Referring Physician: Gean Birchwood Treating Physician/Extender: Frann Rider in Treatment: 0 Vital Signs Time Taken: 13:29 Temperature (F): 98.2 Height (in): 70 Pulse (bpm): 65 Source: Stated Respiratory Rate (breaths/min): 18 Weight (lbs): 244 Blood Pressure (mmHg): 124/65 Source: Stated Reference Range: 80 - 120 mg / dl Body Mass Index (BMI): 35 Electronic Signature(s) Signed: 11/06/2014 5:31:46 PM By: Montey Hora Entered By: Montey Hora on 11/06/2014 13:30:45

## 2014-11-07 NOTE — Progress Notes (Signed)
[name], [name] ([id number]) Visit Report for 11/06/2014 Chief Complaint Document Details Patient Name: [name] [name], [name] [name] Date of Service: 11/06/2014 1:00 PM Medical Record Number: [id number] Patient Account Number: [id number] Date of Birth/Sex: 10/31/1926 (81 y.o. Male) Treating RN: Primary Care Physician: Alvester Chou Other Clinician: Referring Physician: Gean Birchwood Treating Physician/Extender: Frann Rider in Treatment: 0 Information Obtained from: Patient Chief Complaint Patient presents to the wound care center for a consult due non healing wound. the patient is a poor historian but from what I understand he had swelling and redness of his legs in the past 4 weeks and was admitted to [facility] hospital. Most of his history is obtained from the electronic medical records. Electronic Signature(s) Signed: 11/06/2014 2:27:58 PM By: Christin Fudge MD, FACS Entered By: Christin Fudge on 11/06/2014 14:27:57 [name] [name] ([id number]) -------------------------------------------------------------------------------- HPI Details Patient Name: [name] [name] Date of Service: 11/06/2014 1:00 PM Medical Record Number: [id number] Patient Account Number: [id number] Date of Birth/Sex: 1927-02-10 (81 y.o. Male) Treating RN: Primary Care Physician: Alvester Chou Other Clinician: Referring Physician: Gean Birchwood Treating Physician/Extender: Frann Rider in Treatment: 0 History of Present Illness Location: swelling of the legs right more than left Quality: Patient reports experiencing a dull pain to affected area(s). Severity: Patient states wound (s) are getting better. Duration: Patient has had the wound for > 3 months prior to seeking treatment at the wound center Timing: Pain in wound is Intermittent (comes and goes Context: The wound appeared gradually over time Modifying Factors: Consults to this date include:hospital admission, IV anti-buttocks, oral anti-buttocks. Associated Signs and  Symptoms: Patient reports having difficulty standing for long periods. HPI Description: [name] [name] is a 81 y.o. male with history of DM, hypertension, chronic kidney disease, gout, diastolic dysfunction was brought to the ER at [facility] after patient was having increasing swelling with increasing fluid draining from the legs and pain over the last 3 weeks. He was admitted to [facility] hospital between 10/18/2014 and 10/22/2014. He was treated for cellulitis of both lower extremities and also had diabetes mellitus, chronic kidney disease, hypertension, diastolic dysfunction. he was given IV vancomycin and Zosyn while he was in hospital and then on discharge he was given doxycycline and Keflex for 7 more days. Local care was with Eucerin cream, Xeroform gauze and Ace wrap at home. recent hemoglobin A1c was 6.6. During the admission x-rays of both lower extremities were within normal limits. A venous duplex study revealed no evidence of deep vein thrombosis or superficial thrombosis involving the right and left lower extremity. Was seen in January 2016 by Dr. Adele Barthel at [facility]: Non-Invasive Vascular Imaging ABI (05/05/2014) o R: 0.65 (0.73), DP: mono, PT: mono, TBI: 0.57 o L: 0.65 (0.69), DP: mono, PT: mono, TBI: 0.36 The patient had the diagnosis of bilateral lower extremity moderate peripheral atherosclerotic disease, bilateral lower extremity chronic venous insufficiency. due to the patient's age and mental status conservative therapy was only discussed and no surgical intervention was planned. Electronic Signature(s) Signed: 11/06/2014 2:29:06 PM By: Christin Fudge MD, FACS Previous Signature: 11/06/2014 2:00:37 PM Version By: Christin Fudge MD, FACS Previous Signature: 11/06/2014 1:51:41 PM Version By: Christin Fudge MD, FACS Previous Signature: 11/06/2014 1:44:39 PM Version By: Christin Fudge MD, FACS Entered By: Christin Fudge on 11/06/2014 14:29:06 [name] [name]  ([id number]) -------------------------------------------------------------------------------- Physical Exam Details Patient Name: [name] [name] Date of Service: 11/06/2014 1:00 PM Medical Record Number: [id number] Patient Account Number: [id number] Date of Birth/Sex: [birth year] (81 y.o. Male) Treating RN: Primary Care Physician: Alvester Chou Other Clinician: Referring  Physician: Gean Birchwood Treating Physician/Extender: Frann Rider in Treatment: 0 Constitutional . Pulse regular. Respirations normal and unlabored. Afebrile. . Eyes Nonicteric. Reactive to light. Ears, Nose, Mouth, and Throat Lips, teeth, and gums WNL.Marland Kitchen Moist mucosa without lesions . Neck supple and nontender. No palpable supraclavicular or cervical adenopathy. Normal sized without goiter. Respiratory WNL. No retractions.. Cardiovascular Pedal Pulses not palpable and no ABI could be measured.. he has got edema of both lower extremities right more than left.. Gastrointestinal (GI) Abdomen without masses or tenderness.. No liver or spleen enlargement or tenderness.. Musculoskeletal Adexa without tenderness or enlargement.. Digits and nails w/o clubbing, cyanosis, infection, petechiae, ischemia, or inflammatory conditions.. Integumentary (Hair, Skin) No suspicious lesions. No crepitus or fluctuance. No peri-wound warmth or erythema. No masses.Marland Kitchen Psychiatric Judgement and insight Intact.. No evidence of depression, anxiety, or agitation.. Notes No definite ulcerations on both lower extremities but he's got significant lymphedema right more than left. He also has elements of stasis dermatitis and he's got psoriasis of his lower extremities. Electronic Signature(s) Signed: 11/06/2014 2:30:41 PM By: Christin Fudge MD, FACS Entered By: Christin Fudge on 11/06/2014 14:30:41 [name] [name] ([id number]) -------------------------------------------------------------------------------- Physician Orders Details Patient  Name: [name] [name] Date of Service: 11/06/2014 1:00 PM Medical Record Number: [id number] Patient Account Number: [id number] Date of Birth/Sex: [birth year] (80 y.o. Male) Treating RN: Montey Hora Primary Care Physician: Alvester Chou Other Clinician: Referring Physician: Gean Birchwood Treating Physician/Extender: Frann Rider in Treatment: 0 Verbal / Phone Orders: Yes Clinician: Montey Hora Read Back and Verified: Yes Diagnosis Coding Wound Cleansing o Cleanse wound with mild soap and water Primary Wound Dressing o Contact layer Follow-up Appointments o Return Appointment in 1 week. Edema Control o 2 Layer Lite Compression System - Bilateral [place] Visits - [facility] - please do not use unna boot only 2 layer lite wrap o Home Health Nurse may visit PRN to address patientos wound care needs. o FACE TO FACE ENCOUNTER: MEDICARE and MEDICAID PATIENTS: I certify that this patient is under my care and that I had a face-to-face encounter that meets the physician face-to-face encounter requirements with this patient on this date. The encounter with the patient was in whole or in part for the following MEDICAL CONDITION: (primary reason for [place]) MEDICAL NECESSITY: I certify, that based on my findings, NURSING services are a medically necessary home health service. HOME BOUND STATUS: I certify that my clinical findings support that this patient is homebound (i.e., Due to illness or injury, pt requires aid of supportive devices such as crutches, cane, wheelchairs, walkers, the use of special transportation or the assistance of another person to leave their place of residence. There is a normal inability to leave the home and doing so requires considerable and taxing effort. Other absences are for medical reasons / religious services and are infrequent or of short duration when for other reasons). o If current dressing causes  regression in wound condition, may D/C ordered dressing product/s and apply Normal Saline Moist Dressing daily until next [place] / Other MD appointment. [place] of regression in wound condition at [phone]. o Please direct any NON-WOUND related issues/requests for orders to patient's Primary Care Physician Electronic Signature(s) Signed: 11/06/2014 4:09:13 PM By: Christin Fudge MD, FACS Signed: 11/06/2014 5:31:46 PM By: Montey Hora Entered By: Montey Hora on 11/06/2014 14:20:14 [name] [name] ([id number]) -------------------------------------------------------------------------------- Problem List Details Patient Name: [name] [name] Date of Service: 11/06/2014 1:00 PM Medical Record Number: [id number] Patient Account  Number: [id number] Date of Birth/Sex: [birth year] (81 y.o. Male) Treating RN: Primary Care Physician: Alvester Chou Other Clinician: Referring Physician: Gean Birchwood Treating Physician/Extender: Frann Rider in Treatment: 0 Active Problems ICD-10 Encounter Code Description Active Date Diagnosis E11.622 Type 2 diabetes mellitus with other skin ulcer 11/06/2014 Yes I89.0 Lymphedema, not elsewhere classified 11/06/2014 Yes I70.238 Atherosclerosis of native arteries of right leg with 11/06/2014 Yes ulceration of other part of lower right leg I70.248 Atherosclerosis of native arteries of left leg with ulceration 11/06/2014 Yes of other part of lower left leg I87.331 Chronic venous hypertension (idiopathic) with ulcer and 11/06/2014 Yes inflammation of right lower extremity I87.332 Chronic venous hypertension (idiopathic) with ulcer and 11/06/2014 Yes inflammation of left lower extremity Inactive Problems Resolved Problems Electronic Signature(s) Signed: 11/06/2014 2:26:07 PM By: Christin Fudge MD, FACS Previous Signature: 11/06/2014 2:25:58 PM Version By: Christin Fudge MD, FACS Entered By: Christin Fudge on 11/06/2014  14:26:07 [name] [name] ([id number]) -------------------------------------------------------------------------------- Progress Note Details Patient Name: [name] [name] Date of Service: 11/06/2014 1:00 PM Medical Record Number: [id number] Patient Account Number: [id number] Date of Birth/Sex: Apr 09, 1927 (81 y.o. Male) Treating RN: Primary Care Physician: Alvester Chou Other Clinician: Referring Physician: Gean Birchwood Treating Physician/Extender: Frann Rider in Treatment: 0 Subjective Chief Complaint Information obtained from Patient Patient presents to the wound care center for a consult due non healing wound. the patient is a poor historian but from what I understand he had swelling and redness of his legs in the past 4 weeks and was admitted to [facility] hospital. Most of his history is obtained from the electronic medical records. History of Present Illness (HPI) The following HPI elements were documented for the patient's wound: Location: swelling of the legs right more than left Quality: Patient reports experiencing a dull pain to affected area(s). Severity: Patient states wound (s) are getting better. Duration: Patient has had the wound for > 3 months prior to seeking treatment at the wound center Timing: Pain in wound is Intermittent (comes and goes Context: The wound appeared gradually over time Modifying Factors: Consults to this date include:hospital admission, IV anti-buttocks, oral anti-buttocks. Associated Signs and Symptoms: Patient reports having difficulty standing for long periods. [name] [name] is a 81 y.o. male with history of DM, hypertension, chronic kidney disease, gout, diastolic dysfunction was brought to the ER at [facility] after patient was having increasing swelling with increasing fluid draining from the legs and pain over the last 3 weeks. He was admitted to [facility] hospital between 10/18/2014 and 10/22/2014. He was treated for cellulitis of both lower extremities  and also had diabetes mellitus, chronic kidney disease, hypertension, diastolic dysfunction. he was given IV vancomycin and Zosyn while he was in hospital and then on discharge he was given doxycycline and Keflex for 7 more days. Local care was with Eucerin cream, Xeroform gauze and Ace wrap at home. recent hemoglobin A1c was 6.6. During the admission x-rays of both lower extremities were within normal limits. A venous duplex study revealed no evidence of deep vein thrombosis or superficial thrombosis involving the right and left lower extremity. Was seen in January 2016 by Dr. Adele Barthel at [facility]: Non-Invasive Vascular Imaging ABI (05/05/2014)  R: 0.65 (0.73), DP: mono, PT: mono, TBI: 0.57  L: 0.65 (0.69), DP: mono, PT: mono, TBI: 0.36 The patient had the diagnosis of bilateral lower extremity moderate peripheral atherosclerotic disease, bilateral lower extremity chronic venous insufficiency. due to the patient's age and mental status conservative therapy was only discussed and no surgical intervention was  planned. Wound History ROURKE, KASE ([id number]) Patient presents with 2 open wounds that have been present for approximately 2 weeks. Patient has been treating wounds in the following manner: wraps. Laboratory tests have not been performed in the last month. Patient reportedly has not tested positive for an antibiotic resistant organism. Patient reportedly has not tested positive for osteomyelitis. Patient reportedly has not had testing performed to evaluate circulation in the legs. Patient experiences the following problems associated with their wounds: swelling. Patient History Information obtained from Patient. Allergies No Known Allergies Family History Hypertension - Mother, Father, Stroke - Father, No family history of Cancer, Diabetes, Heart Disease, Hereditary Spherocytosis, Kidney Disease, Lung Disease, Seizures, Thyroid Problems, Tuberculosis. Social History Former  smoker, Marital Status - Widowed, Alcohol Use - Never, Drug Use - No History, Caffeine Use - Never. Medical History Cardiovascular Patient has history of Congestive Heart Failure, Hypertension Endocrine Patient has history of Type II Diabetes Neurologic Patient has history of Neuropathy Oncologic Denies history of Received Chemotherapy, Received Radiation Hospitalization/Surgery History - 11/02/2014, [place], cellulitis. Review of Systems (ROS) Constitutional Symptoms (General Health) The patient has no complaints or symptoms. Eyes The patient has no complaints or symptoms. Ear/Nose/Mouth/Throat The patient has no complaints or symptoms. Hematologic/Lymphatic The patient has no complaints or symptoms. Respiratory The patient has no complaints or symptoms. Cardiovascular Complains or has symptoms of LE edema. Gastrointestinal The patient has no complaints or symptoms. Genitourinary Complains or has symptoms of Kidney failure/ Dialysis - CKD stage 3. LANIER, BEMISS ([id number]) Immunological The patient has no complaints or symptoms. Integumentary (Skin) The patient has no complaints or symptoms. Musculoskeletal The patient has no complaints or symptoms. Neurologic The patient has no complaints or symptoms. Psychiatric The patient has no complaints or symptoms. Medications losartan 25 mg tablet oral tablet oral verapamil ER (SR) 240 mg tablet,extended release oral tablet extended release oral allopurinol 300 mg tablet oral tablet oral Objective Constitutional Pulse regular. Respirations normal and unlabored. Afebrile. Vitals Time Taken: 1:29 PM, Height: 70 in, Source: Stated, Weight: 244 lbs, Source: Stated, BMI: 35, Temperature: 98.2 F, Pulse: 65 bpm, Respiratory Rate: 18 breaths/min, Blood Pressure: 124/65 mmHg. Eyes Nonicteric. Reactive to light. Ears, Nose, Mouth, and Throat Lips, teeth, and gums WNL.Marland Kitchen Moist mucosa without lesions . Neck supple and nontender. No  palpable supraclavicular or cervical adenopathy. Normal sized without goiter. Respiratory WNL. No retractions.. Cardiovascular Pedal Pulses not palpable and no ABI could be measured.. he has got edema of both lower extremities right more than left.. Gastrointestinal (GI) [name] [name], [name] [name] ([id number]) Abdomen without masses or tenderness.. No liver or spleen enlargement or tenderness.. Musculoskeletal Adexa without tenderness or enlargement.. Digits and nails w/o clubbing, cyanosis, infection, petechiae, ischemia, or inflammatory conditions.Marland Kitchen Psychiatric Judgement and insight Intact.. No evidence of depression, anxiety, or agitation.. General Notes: No definite ulcerations on both lower extremities but he's got significant lymphedema right more than left. He also has elements of stasis dermatitis and he's got psoriasis of his lower extremities. Integumentary (Hair, Skin) No suspicious lesions. No crepitus or fluctuance. No peri-wound warmth or erythema. No masses.. Other Condition(s) Patient presents with Lymphedema located on the Bilateral Leg. The skin appearance exhibited: Hemosiderin Staining, Moist. The skin appearance did not exhibit: Atrophie Blanche, Callus, Crepitus, Cyanosis, Dry/Scaly, Ecchymosis, Erythema, Excoriation, Fluctuance, Friable, Induration, Localized Edema, Maceration, Mottled, Pallor, Rash, Rubor, Scarring. Skin temperature was noted as No Abnormality. Assessment Active Problems ICD-10 E11.622 - Type 2 diabetes mellitus with other skin ulcer I89.0 - Lymphedema, not elsewhere classified I70.238 - Atherosclerosis  of native arteries of right leg with ulceration of other part of lower right leg I70.248 - Atherosclerosis of native arteries of left leg with ulceration of other part of lower left leg I87.331 - Chronic venous hypertension (idiopathic) with ulcer and inflammation of right lower extremity I87.332 - Chronic venous hypertension (idiopathic) with ulcer and  inflammation of left lower extremity This gentleman who is 81 years old and a poor historian comes with bilateral lower extremity lymphedema and possible cellulitis recently treated as an inpatient in hospital. He has had a workup done by the vascular surgeons earlier this year which showed a mixed pattern of arterial and venous disease of both lower extremities. He was not a candidate for surgery. [name], [name] ([id number]) I'm going to recommend elevation of the limbs and a very light layer compression due to his arterial insufficiency. His home health nurses are applying Unna's boot bilaterally and have asked them not to do this. He does not have any family member with him nor is there any other caregiver and it's very difficult to explain it to him because of his limited understanding possibly due to dementia. We will communicate his orders to his home health nurses and see him back next week. Plan Wound Cleansing: Cleanse wound with mild soap and water Primary Wound Dressing: Contact layer Follow-up Appointments: Return Appointment in 1 week. Edema Control: 2 Layer Lite Compression System - Bilateral Home Health: [place] Visits - [facility] - please do not use unna boot only 2 layer lite wrap Home Health Nurse may visit PRN to address patient s wound care needs. FACE TO FACE ENCOUNTER: MEDICARE and MEDICAID PATIENTS: I certify that this patient is under my care and that I had a face-to-face encounter that meets the physician face-to-face encounter requirements with this patient on this date. The encounter with the patient was in whole or in part for the following MEDICAL CONDITION: (primary reason for [place]) MEDICAL NECESSITY: I certify, that based on my findings, NURSING services are a medically necessary home health service. HOME BOUND STATUS: I certify that my clinical findings support that this patient is homebound (i.e., Due to illness or injury, pt requires  aid of supportive devices such as crutches, cane, wheelchairs, walkers, the use of special transportation or the assistance of another person to leave their place of residence. There is a normal inability to leave the home and doing so requires considerable and taxing effort. Other absences are for medical reasons / religious services and are infrequent or of short duration when for other reasons). If current dressing causes regression in wound condition, may D/C ordered dressing product/s and apply Normal Saline Moist Dressing daily until next [place] / Other MD appointment. [place] of regression in wound condition at [phone]. Please direct any NON-WOUND related issues/requests for orders to patient's Primary Care Physician This gentleman who is 81 years old and a poor historian comes with bilateral lower extremity lymphedema and possible cellulitis recently treated as an inpatient in hospital. He has had a workup done by the vascular surgeons earlier this year which showed a mixed pattern of arterial and venous disease of both lower extremities. He was not a candidate for surgery. [name] [name], [name] [name] ([id number]) I'm going to recommend elevation of the limbs and a very light layer compression due to his arterial insufficiency. His home health nurses are applying Unna's boot bilaterally and have asked them not to do this. He does not have any family member with  him nor is there any other caregiver and it's very difficult to explain it to him because of his limited understanding possibly due to dementia. We will communicate his orders to his home health nurses and see him back next week. Electronic Signature(s) Signed: 11/06/2014 2:33:32 PM By: Christin Fudge MD, FACS Entered By: Christin Fudge on 11/06/2014 14:33:31 [name] [name] ([id number]) -------------------------------------------------------------------------------- ROS/PFSH Details Patient Name: [name] [name] Date of Service: 11/06/2014 1:00 PM Medical Record Number: [id number] Patient Account Number: [id number] Date of Birth/Sex: 17-Sep-1926 (81 y.o. Male) Treating RN: Montey Hora Primary Care Physician: Alvester Chou Other Clinician: Referring Physician: Gean Birchwood Treating Physician/Extender: Frann Rider in Treatment: 0 Information Obtained From Patient Wound History Do you currently have one or more open woundso Yes How many open wounds do you currently haveo 2 Approximately how long have you had your woundso 2 weeks How have you been treating your wound(s) until nowo wraps Has your wound(s) ever healed and then re-openedo No Have you had any lab work done in the past montho No Have you tested positive for an antibiotic resistant organism (MRSA, VRE)o No Have you tested positive for osteomyelitis (bone infection)o No Have you had any tests for circulation on your legso No Have you had other problems associated with your woundso Swelling Cardiovascular Complaints and Symptoms: Positive for: LE edema Medical History: Positive for: Congestive Heart Failure; Hypertension Genitourinary Complaints and Symptoms: Positive for: Kidney failure/ Dialysis - CKD stage 3 Constitutional Symptoms (General Health) Complaints and Symptoms: No Complaints or Symptoms Eyes Complaints and Symptoms: No Complaints or Symptoms Ear/Nose/Mouth/Throat Complaints and Symptoms: No Complaints or Symptoms [name], [name] Noa ([id number]) Hematologic/Lymphatic Complaints and Symptoms: No Complaints or Symptoms Respiratory Complaints and Symptoms: No Complaints or Symptoms Gastrointestinal Complaints and Symptoms: No Complaints or Symptoms Endocrine Medical History: Positive for: Type II Diabetes Time with diabetes: unknown - diet controlled Immunological Complaints and Symptoms: No Complaints or Symptoms Integumentary (Skin) Complaints and Symptoms: No Complaints or  Symptoms Musculoskeletal Complaints and Symptoms: No Complaints or Symptoms Neurologic Complaints and Symptoms: No Complaints or Symptoms Medical History: Positive for: Neuropathy Oncologic Medical History: Negative for: Received Chemotherapy; Received Radiation Psychiatric [name] [name], [name] [name] ([id number]) Complaints and Symptoms: No Complaints or Symptoms Hospitalization / Surgery History Name of Hospital Purpose of Hospitalization/Surgery Date [facility] cellulitis 11/02/2014 Family and Social History Cancer: No; Diabetes: No; Heart Disease: No; Hereditary Spherocytosis: No; Hypertension: Yes - Mother, Father; Kidney Disease: No; Lung Disease: No; Seizures: No; Stroke: Yes - Father; Thyroid Problems: No; Tuberculosis: No; Former smoker; Marital Status - Widowed; Alcohol Use: Never; Drug Use: No History; Caffeine Use: Never; Financial Concerns: No; Food, Clothing or Shelter Needs: No; Support System Lacking: No; Transportation Concerns: No; Advanced Directives: No; Patient does not want information on Advanced Directives; Living Will: No Physician Affirmation I have reviewed and agree with the above information. Electronic Signature(s) Signed: 11/06/2014 1:36:58 PM By: Christin Fudge MD, FACS Signed: 11/06/2014 5:31:46 PM By: Montey Hora Entered By: Christin Fudge on 11/06/2014 13:36:57 [name] [name] ([id number]) -------------------------------------------------------------------------------- SuperBill Details Patient Name: [name] [name] Date of Service: 11/06/2014 Medical Record Number: [id number] Patient Account Number: [id number] Date of Birth/Sex: 1926/06/11 (81 y.o. 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 lower left leg Chronic venous hypertension (idiopathic) with ulcer and inflammation of right lower I87.331 extremity Chronic venous hypertension (idiopathic) with ulcer and inflammation of left lower I87.332 extremity Facility Procedures CPT4 Code: [id number] Description: 99214 - WOUND CARE VISIT-LEV 4 EST PT Modifier: Quantity: 1 Physician Procedures CPT4: Description Modifier Quantity Code [id number] - WC PHYS LEVEL 4 - NEW PT 1 ICD-10 Description Diagnosis E11.622 Type 2 diabetes mellitus with other skin ulcer I89.0 Lymphedema, not elsewhere classified I70.238 Atherosclerosis of native arteries  of right leg with ulceration of other part of lower right leg I87.331 Chronic venous hypertension (idiopathic) with ulcer and inflammation of right lower extremity Electronic Signature(s) Signed: 11/06/2014 4:58:43 PM By: Montey Hora Previous Signature: 11/06/2014 2:33:51 PM Version By: Christin Fudge MD, FACS Entered By: Montey Hora on 11/06/2014 16:58:43

## 2014-11-13 ENCOUNTER — Ambulatory Visit: Payer: Medicare Other | Admitting: Surgery

## 2014-11-21 ENCOUNTER — Ambulatory Visit: Payer: Medicare Other | Admitting: Surgery

## 2015-04-09 ENCOUNTER — Encounter: Payer: Medicare Other | Attending: Surgery | Admitting: Surgery

## 2015-04-09 DIAGNOSIS — Z992 Dependence on renal dialysis: Secondary | ICD-10-CM | POA: Insufficient documentation

## 2015-04-09 DIAGNOSIS — Z87891 Personal history of nicotine dependence: Secondary | ICD-10-CM | POA: Insufficient documentation

## 2015-04-09 DIAGNOSIS — I70248 Atherosclerosis of native arteries of left leg with ulceration of other part of lower left leg: Secondary | ICD-10-CM | POA: Insufficient documentation

## 2015-04-09 DIAGNOSIS — L97811 Non-pressure chronic ulcer of other part of right lower leg limited to breakdown of skin: Secondary | ICD-10-CM | POA: Insufficient documentation

## 2015-04-09 DIAGNOSIS — L97421 Non-pressure chronic ulcer of left heel and midfoot limited to breakdown of skin: Secondary | ICD-10-CM | POA: Insufficient documentation

## 2015-04-09 DIAGNOSIS — I87332 Chronic venous hypertension (idiopathic) with ulcer and inflammation of left lower extremity: Secondary | ICD-10-CM | POA: Diagnosis not present

## 2015-04-09 DIAGNOSIS — E11622 Type 2 diabetes mellitus with other skin ulcer: Secondary | ICD-10-CM | POA: Insufficient documentation

## 2015-04-09 DIAGNOSIS — I87331 Chronic venous hypertension (idiopathic) with ulcer and inflammation of right lower extremity: Secondary | ICD-10-CM | POA: Diagnosis not present

## 2015-04-09 DIAGNOSIS — L97821 Non-pressure chronic ulcer of other part of left lower leg limited to breakdown of skin: Secondary | ICD-10-CM | POA: Diagnosis not present

## 2015-04-09 DIAGNOSIS — N183 Chronic kidney disease, stage 3 (moderate): Secondary | ICD-10-CM | POA: Insufficient documentation

## 2015-04-09 DIAGNOSIS — I129 Hypertensive chronic kidney disease with stage 1 through stage 4 chronic kidney disease, or unspecified chronic kidney disease: Secondary | ICD-10-CM | POA: Diagnosis not present

## 2015-04-09 DIAGNOSIS — I70238 Atherosclerosis of native arteries of right leg with ulceration of other part of lower right leg: Secondary | ICD-10-CM | POA: Diagnosis not present

## 2015-04-09 DIAGNOSIS — I890 Lymphedema, not elsewhere classified: Secondary | ICD-10-CM | POA: Insufficient documentation

## 2015-04-09 DIAGNOSIS — F0390 Unspecified dementia without behavioral disturbance: Secondary | ICD-10-CM | POA: Insufficient documentation

## 2015-04-10 NOTE — Progress Notes (Signed)
LENNOX, DAYAL ([id number]) Visit Report for 04/09/2015 Abuse/Suicide Risk Screen Details Patient Name: [name] [name], [name] [name] Date of Service: 04/09/2015 2:15 PM Medical Record Number: [id number] Patient Account Number: [id number] Date of Birth/Sex: November 25, 1926 (81 y.o. Male) Treating RN: Montey Hora Primary Care Physician: Alvester Chou Other Clinician: Referring Physician: SELF, REFERRED Treating Physician/Extender: Frann Rider in Treatment: 0 Abuse/Suicide Risk Screen Items Answer ABUSE/SUICIDE RISK SCREEN: Has anyone close to you tried to hurt or harm you recentlyo No Do you feel uncomfortable with anyone in your familyo No Has anyone forced you do things that you didnot want to doo No Do you have any thoughts of harming yourselfo No Patient displays signs or symptoms of abuse and/or neglect. No Electronic Signature(s) Signed: 04/09/2015 5:57:37 PM By: Montey Hora Entered By: Montey Hora on 04/09/2015 14:24:06 [name] [name] ([id number]) -------------------------------------------------------------------------------- Activities of Daily Living Details Patient Name: [name] [name] Date of Service: 04/09/2015 2:15 PM Medical Record Number: [id number] Patient Account Number: [id number] Date of Birth/Sex: [birth year] (81 y.o. Male) Treating RN: Montey Hora Primary Care Physician: Alvester Chou Other Clinician: Referring Physician: SELF, REFERRED Treating Physician/Extender: Frann Rider in Treatment: 0 Activities of Daily Living Items Answer Activities of Daily Living (Please select one for each item) Drive Automobile Not Able Take Medications Need Assistance Use Telephone Completely [place] for Appearance Need Assistance Use Toilet Need Assistance Bath / Shower Need Assistance Dress Self Need Assistance Feed Self Completely Able Walk Need Assistance Get In / Out Bed Completely [place] Need  Assistance Shop for Self Need Assistance Electronic Signature(s) Signed: 04/09/2015 5:57:37 PM By: Montey Hora Entered By: Montey Hora on 04/09/2015 14:24:41 [name] [name] ([id number]) -------------------------------------------------------------------------------- Education Assessment Details Patient Name: [name] [name] Date of Service: 04/09/2015 2:15 PM Medical Record Number: [id number] Patient Account Number: [id number] Date of Birth/Sex: [birth year] (81 y.o. Male) Treating RN: Montey Hora Primary Care Physician: Alvester Chou Other Clinician: Referring Physician: SELF, REFERRED Treating Physician/Extender: Frann Rider in Treatment: 0 Primary Learner Assessed: Patient Learning Preferences/Education Level/Primary Language Learning Preference: Explanation, Demonstration Highest Education Level: High School Preferred Language: English Cognitive Barrier Assessment/Beliefs Language Barrier: No Translator Needed: No Memory Deficit: No Emotional Barrier: No Cultural/Religious Beliefs Affecting Medical No Care: Physical Barrier Assessment Impaired Vision: No Impaired Hearing: No Decreased Hand dexterity: No Knowledge/Comprehension Assessment Knowledge Level: Medium Comprehension Level: Medium Ability to understand written Medium instructions: Ability to understand verbal Medium instructions: Motivation Assessment Anxiety Level: Calm Cooperation: Cooperative Education Importance: Acknowledges Need Interest in Health Problems: Asks Questions Perception: Coherent Willingness to Engage in Self- Medium Management Activities: Readiness to Engage in Self- Medium Management Activities: Electronic Signature(sVERA, WOLFFE ([id number]) Signed: 04/09/2015 5:57:37 PM By: Montey Hora Entered By: Montey Hora on 04/09/2015 14:25:06 [name] [name] ([id number]) -------------------------------------------------------------------------------- Fall Risk Assessment  Details Patient Name: [name] [name] Date of Service: 04/09/2015 2:15 PM Medical Record Number: [id number] Patient Account Number: [id number] Date of Birth/Sex: January 02, 1927 (81 y.o. Male) Treating RN: Montey Hora Primary Care Physician: Alvester Chou Other Clinician: Referring Physician: SELF, REFERRED Treating Physician/Extender: Frann Rider in Treatment: 0 Fall Risk Assessment Items Have you had 2 or more falls in the last 12 monthso 0 No Have you had any fall that resulted in injury in the last 12 monthso 0 No FALL RISK ASSESSMENT: History of falling - immediate or within 3 months 0 No Secondary diagnosis 0 No Ambulatory aid None/bed rest/wheelchair/nurse 0 No Crutches/cane/walker 15 Yes Furniture 0 No IV Access/Saline Lock 0 No Gait/Training Normal/bed rest/immobile  0 No Weak 10 Yes Impaired 0 No Mental Status Oriented to own ability 0 Yes Electronic Signature(s) Signed: 04/09/2015 5:57:37 PM By: Montey Hora Entered By: Montey Hora on 04/09/2015 14:25:49 [name] [name] ([id number]) -------------------------------------------------------------------------------- Foot Assessment Details Patient Name: [name] [name] Date of Service: 04/09/2015 2:15 PM Medical Record Number: [id number] Patient Account Number: [id number] Date of Birth/Sex: [birth year] (81 y.o. Male) Treating RN: Montey Hora Primary Care Physician: Alvester Chou Other Clinician: Referring Physician: SELF, REFERRED Treating Physician/Extender: Frann Rider in Treatment: 0 Foot Assessment Items Site Locations + = Sensation present, - = Sensation absent, C = Callus, U = Ulcer R = Redness, W = Warmth, M = Maceration, PU = Pre-ulcerative lesion F = Fissure, S = Swelling, D = Dryness Assessment Right: Left: Other Deformity: No No Prior Foot Ulcer: No No Prior Amputation: No No Charcot Joint: No No Ambulatory Status: Ambulatory With Help Assistance Device: Walker Gait: Actor) Signed: 04/09/2015 5:57:37 PM By: Montey Hora Entered By: Montey Hora on 04/09/2015 14:32:04 [name] [name] ([id number]) -------------------------------------------------------------------------------- Nutrition Risk Assessment Details Patient Name: [name] [name] Date of Service: 04/09/2015 2:15 PM Medical Record Number: [id number] Patient Account Number: [id number] Date of Birth/Sex: 1926-09-16 (81 y.o. Male) Treating RN: Montey Hora Primary Care Physician: Alvester Chou Other Clinician: Referring Physician: SELF, REFERRED Treating Physician/Extender: Frann Rider in Treatment: 0 Height (in): 70 Weight (lbs): 230 Body Mass Index (BMI): 33 Nutrition Risk Assessment Items NUTRITION RISK SCREEN: I have an illness or condition that made me change the kind and/or 0 No amount of food I eat I eat fewer than two meals per day 0 No I eat few fruits and vegetables, or milk products 0 No I have three or more drinks of beer, liquor or wine almost every day 0 No I have tooth or mouth problems that make it hard for me to eat 0 No I don't always have enough money to buy the food I need 0 No I eat alone most of the time 0 No I take three or more different prescribed or over-the-counter drugs a 1 Yes day Without wanting to, I have lost or gained 10 pounds in the last six 0 No months I am not always physically able to shop, cook and/or feed myself 0 No Nutrition Protocols Good Risk Protocol 0 No interventions needed Moderate Risk Protocol Electronic Signature(s) Signed: 04/09/2015 5:57:37 PM By: Montey Hora Entered By: Montey Hora on 04/09/2015 14:25:58

## 2015-04-10 NOTE — Progress Notes (Addendum)
TRYAN, FENWICK ([id number]) Visit Report for 04/09/2015 Chief Complaint Document Details Patient Name: [name] [name], [name] [name] Date of Service: 04/09/2015 2:15 PM Medical Record Number: [id number] Patient Account Number: [id number] Date of Birth/Sex: 09/18/1926 (81 y.o. Male) Treating RN: Montey Hora Primary Care Physician: Alvester Chou Other Clinician: Referring Physician: SELF, REFERRED Treating Physician/Extender: Frann Rider in Treatment: 0 Information Obtained from: Patient Chief Complaint Patient presents to the wound care center for a consult due non healing wound both lower extremities. The patient is a poor historian and was seen here once in July 2016. He says home health was doing his dressings and he was healed but now has recurrence of the problem. Problem has been there at least for 6 months. Electronic Signature(s) Signed: 04/09/2015 3:27:33 PM By: Christin Fudge MD, FACS Entered By: Christin Fudge on 04/09/2015 15:27:33 [name] [name] ([id number]) -------------------------------------------------------------------------------- HPI Details Patient Name: [name] [name] Date of Service: 04/09/2015 2:15 PM Medical Record Number: [id number] Patient Account Number: [id number] Date of Birth/Sex: 24-Aug-1926 (81 y.o. Male) Treating RN: Montey Hora Primary Care Physician: Alvester Chou Other Clinician: Referring Physician: SELF, REFERRED Treating Physician/Extender: Frann Rider in Treatment: 0 History of Present Illness Location: swelling of the legs right more than left an ulceration both lower extremities Quality: Patient reports experiencing a dull pain to affected area(s). Severity: Patient states wound (s) are getting worse Duration: Patient has had the wound for > 6 months prior to seeking treatment at the wound center Timing: Pain in wound is Intermittent. Context: The wound appeared gradually over time Modifying Factors: he has been having home health come and apply  his dressing daily was healed. Associated Signs and Symptoms: Patient reports having difficulty standing for long periods. HPI Description: Jaiyden Emig is a 81 y.o. male with history of DM, hypertension, chronic kidney disease, gout, diastolic dysfunction and the patient was having increasing swelling with increasing fluid draining from the legs and pain over the last 6 weeks. He says his wounds are completely healed and the home health had stopped coming to do his dressings. Recent hemoglobin A1c was 6.6. During the last admission A venous duplex study revealed no evidence of deep vein thrombosis or superficial thrombosis involving the right and left lower extremity. Was seen in January 2016 by Dr. Adele Barthel at [facility]: Non-Invasive Vascular Imaging ABI (05/05/2014) o R: 0.65 (0.73), DP: mono, PT: mono, TBI: 0.57 o L: 0.65 (0.69), DP: mono, PT: mono, TBI: 0.36 The patient had the diagnosis of bilateral lower extremity moderate peripheral atherosclerotic disease, bilateral lower extremity chronic venous insufficiency. Due to the patient's age and mental status conservative therapy was only discussed and no surgical intervention was planned. The patient has significant dementia and has a caregiver who spends a few hours every day coming to see him. He does not have any family members. Electronic Signature(s) Signed: 04/09/2015 3:30:34 PM By: Christin Fudge MD, FACS Previous Signature: 04/09/2015 2:24:19 PM Version By: Christin Fudge MD, FACS Entered By: Christin Fudge on 04/09/2015 15:30:34 [name] [name] ([id number]) -------------------------------------------------------------------------------- Physical Exam Details Patient Name: [name] [name] Date of Service: 04/09/2015 2:15 PM Medical Record Number: [id number] Patient Account Number: [id number] Date of Birth/Sex: 07-27-1926 (80 y.o. Male) Treating RN: Montey Hora Primary Care Physician: Alvester Chou Other Clinician: Referring Physician: SELF,  REFERRED Treating Physician/Extender: Frann Rider in Treatment: 0 Constitutional . Pulse regular. Respirations normal and unlabored. Afebrile. . Eyes Nonicteric. Reactive to light. Ears, Nose, Mouth, and Throat Lips, teeth, and gums WNL.Marland Kitchen Moist mucosa without lesions . Neck supple and  nontender. No palpable supraclavicular or cervical adenopathy. Normal sized without goiter. Respiratory WNL. No retractions.. Cardiovascular Pedal Pulses WNL. ABI was bilaterally noncompressible.. No clubbing, cyanosis or edema. Gastrointestinal (GI) Abdomen without masses or tenderness.. No liver or spleen enlargement or tenderness.. Lymphatic No adneopathy. No adenopathy. No adenopathy. Musculoskeletal Adexa without tenderness or enlargement.. Digits and nails w/o clubbing, cyanosis, infection, petechiae, ischemia, or inflammatory conditions.. Integumentary (Hair, Skin) No suspicious lesions. No crepitus or fluctuance. No peri-wound warmth or erythema. No masses.Marland Kitchen Psychiatric Judgement and insight Intact.. No evidence of depression, anxiety, or agitation.. Notes The patient has signs and symptoms of chronic stage 2 lymphedema with significant ulceration at the present time and this is present bilaterally. he has bilateral lower extremity ulcerations and of concern he has some between his toes on the left foot and also at the plantar aspect at the base of his toes. He has hyperkeratosis and dermal thickening bilaterally. Electronic Signature(s) Signed: 04/09/2015 3:32:53 PM By: Christin Fudge MD, FACS Entered By: Christin Fudge on 04/09/2015 15:32:52 [name] [name] ([id number]) -------------------------------------------------------------------------------- Physician Orders Details Patient Name: [name] [name] Date of Service: 04/09/2015 2:15 PM Medical Record Number: [id number] Patient Account Number: [id number] Date of Birth/Sex: [birth year] (81 y.o. Male) Treating RN: Montey Hora Primary  Care Physician: Alvester Chou Other Clinician: Referring Physician: SELF, REFERRED Treating Physician/Extender: Frann Rider in Treatment: 0 Verbal / Phone Orders: Yes Clinician: Montey Hora Read Back and Verified: Yes Diagnosis Coding Wound Cleansing Wound #1 Right,Circumferential Lower Leg o Clean wound with Normal Saline. o Cleanse wound with mild soap and water Wound #2 Left,Circumferential Lower Leg o Clean wound with Normal Saline. o Cleanse wound with mild soap and water Wound #3 Left,Plantar Foot o Clean wound with Normal Saline. o Cleanse wound with mild soap and water Anesthetic Wound #1 Right,Circumferential Lower Leg o Topical Lidocaine 4% cream applied to wound bed prior to debridement Wound #2 Left,Circumferential Lower Leg o Topical Lidocaine 4% cream applied to wound bed prior to debridement Wound #3 Left,Plantar Foot o Topical Lidocaine 4% cream applied to wound bed prior to debridement Primary Wound Dressing Wound #1 Right,Circumferential Lower Leg o Aquacel Ag - or equivalent Wound #2 Left,Circumferential Lower Leg o Aquacel Ag - or equivalent Wound #3 Left,Plantar Foot o Aquacel Ag - or equivalent between toes on left foot also Secondary Dressing Wound #1 Right,Circumferential Lower Leg o ABD pad o NAZARETH, OLIVA ([id number]) Wound #2 Left,Circumferential Lower Leg o ABD pad o XtraSorb Wound #3 Left,Plantar Foot o ABD pad o XtraSorb Dressing Change Frequency Wound #1 Right,Circumferential Lower Leg o Change Dressing Monday, Wednesday, Friday - HHRN to change wrap on Wed and Fri Wound #2 Left,Circumferential Lower Leg o Change Dressing Monday, Wednesday, Friday - HHRN to change wrap on Wed and Fri Wound #3 Left,Plantar Foot o Change Dressing Monday, Wednesday, Friday - HHRN to change wrap on Wed and Fri Follow-up Appointments Wound #1 Right,Circumferential Lower Leg o Return Appointment  in 1 week. Wound #2 Left,Circumferential Lower Leg o Return Appointment in 1 week. Wound #3 Left,Plantar Foot o Return Appointment in 1 week. Edema Control Wound #1 Right,Circumferential Lower Leg o Unna Boot to Left Lower Extremity Wound #2 Left,Circumferential Lower Leg o Unna Boot to Left Lower Extremity Wound #3 Left,Plantar Foot o Unna Boot to Left Lower Extremity Home Health Wound #1 Right,Circumferential Lower Leg o [place] for [place] Nurse may visit PRN to address patientos wound care needs. o FACE TO FACE ENCOUNTER: MEDICARE and MEDICAID  PATIENTS: I certify that this patient is under my care and that I had a face-to-face encounter that meets the physician face-to-face encounter requirements with this patient on this date. The encounter with the patient was in whole or in part for the following MEDICAL CONDITION: (primary reason for [place]) MEDICAL NECESSITY: I certify, that based on my findings, NURSING services are a medically IZEA, BRENSINGER ([id number]) necessary home health service. HOME BOUND STATUS: I certify that my clinical findings support that this patient is homebound (i.e., Due to illness or injury, pt requires aid of supportive devices such as crutches, cane, wheelchairs, walkers, the use of special transportation or the assistance of another person to leave their place of residence. There is a normal inability to leave the home and doing so requires considerable and taxing effort. Other absences are for medical reasons / religious services and are infrequent or of short duration when for other reasons). o If current dressing causes regression in wound condition, may D/C ordered dressing product/s and apply Normal Saline Moist Dressing daily until next [place] / Other MD appointment. [place] of regression in wound condition at [phone]. o Please direct any NON-WOUND  related issues/requests for orders to patient's Primary Care Physician Wound #2 Left,Circumferential Lower Leg o [place] for [place] Nurse may visit PRN to address patientos wound care needs. o FACE TO FACE ENCOUNTER: MEDICARE and MEDICAID PATIENTS: I certify that this patient is under my care and that I had a face-to-face encounter that meets the physician face-to-face encounter requirements with this patient on this date. The encounter with the patient was in whole or in part for the following MEDICAL CONDITION: (primary reason for [place]) MEDICAL NECESSITY: I certify, that based on my findings, NURSING services are a medically necessary home health service. HOME BOUND STATUS: I certify that my clinical findings support that this patient is homebound (i.e., Due to illness or injury, pt requires aid of supportive devices such as crutches, cane, wheelchairs, walkers, the use of special transportation or the assistance of another person to leave their place of residence. There is a normal inability to leave the home and doing so requires considerable and taxing effort. Other absences are for medical reasons / religious services and are infrequent or of short duration when for other reasons). o If current dressing causes regression in wound condition, may D/C ordered dressing product/s and apply Normal Saline Moist Dressing daily until next [place] / Other MD appointment. [place] of regression in wound condition at [phone]. o Please direct any NON-WOUND related issues/requests for orders to patient's Primary Care Physician Wound #3 [place] for [place] Nurse may visit PRN to address patientos wound care needs. o FACE TO FACE ENCOUNTER: MEDICARE and MEDICAID PATIENTS: I certify that this patient is under my care and that I had a face-to-face  encounter that meets the physician face-to-face encounter requirements with this patient on this date. The encounter with the patient was in whole or in part for the following MEDICAL CONDITION: (primary reason for [place]) MEDICAL NECESSITY: I certify, that based on my findings, NURSING services are a medically necessary home health service. HOME BOUND STATUS: I certify that my clinical findings support that this patient is homebound (i.e., Due to illness or injury, pt requires aid of supportive devices such as crutches, cane, wheelchairs, walkers, the use of special transportation or  the assistance of another person to leave their place of residence. There is a normal inability to leave the home and doing so requires considerable and taxing effort. Other absences are for medical reasons / religious services and are infrequent or of short duration when for other reasons). HARLEN, TAIT ([id number]) o If current dressing causes regression in wound condition, may D/C ordered dressing product/s and apply Normal Saline Moist Dressing daily until next [place] / Other MD appointment. [place] of regression in wound condition at [phone]. o Please direct any NON-WOUND related issues/requests for orders to patient's Primary Care Physician Electronic Signature(s) Signed: 04/09/2015 4:36:00 PM By: Christin Fudge MD, FACS Signed: 04/09/2015 5:57:37 PM By: Montey Hora Entered By: Montey Hora on 04/09/2015 15:08:15 [name] [name] ([id number]) -------------------------------------------------------------------------------- Problem List Details Patient Name: [name] [name] Date of Service: 04/09/2015 2:15 PM Medical Record Number: [id number] Patient Account Number: [id number] Date of Birth/Sex: 1927/03/02 (81 y.o. Male) Treating RN: Montey Hora Primary Care Physician: Alvester Chou Other Clinician: Referring Physician: SELF, REFERRED Treating  Physician/Extender: Frann Rider in Treatment: 0 Active Problems ICD-10 Encounter Code Description Active Date Diagnosis E11.622 Type 2 diabetes mellitus with other skin ulcer 04/09/2015 Yes I89.0 Lymphedema, not elsewhere classified 04/09/2015 Yes I70.238 Atherosclerosis of native arteries of right leg with 04/09/2015 Yes ulceration of other part of lower right leg I70.248 Atherosclerosis of native arteries of left leg with ulceration 04/09/2015 Yes of other part of lower left leg I87.331 Chronic venous hypertension (idiopathic) with ulcer and 04/09/2015 Yes inflammation of right lower extremity I87.332 Chronic venous hypertension (idiopathic) with ulcer and 04/09/2015 Yes inflammation of left lower extremity Inactive Problems Resolved Problems Electronic Signature(s) Signed: 04/09/2015 3:26:32 PM By: Christin Fudge MD, FACS Entered By: Christin Fudge on 04/09/2015 15:26:32 [name] [name] ([id number]) -------------------------------------------------------------------------------- Progress Note Details Patient Name: [name] [name] Date of Service: 04/09/2015 2:15 PM Medical Record Number: [id number] Patient Account Number: [id number] Date of Birth/Sex: 1926-11-13 (81 y.o. Male) Treating RN: Montey Hora Primary Care Physician: Alvester Chou Other Clinician: Referring Physician: SELF, REFERRED Treating Physician/Extender: Frann Rider in Treatment: 0 Subjective Chief Complaint Information obtained from Patient Patient presents to the wound care center for a consult due non healing wound both lower extremities. The patient is a poor historian and was seen here once in July 2016. He says home health was doing his dressings and he was healed but now has recurrence of the problem. Problem has been there at least for 6 months. History of Present Illness (HPI) The following HPI elements were documented for the patient's wound: Location: swelling of the legs right more than  left an ulceration both lower extremities Quality: Patient reports experiencing a dull pain to affected area(s). Severity: Patient states wound (s) are getting worse Duration: Patient has had the wound for > 6 months prior to seeking treatment at the wound center Timing: Pain in wound is Intermittent. Context: The wound appeared gradually over time Modifying Factors: he has been having home health come and apply his dressing daily was healed. Associated Signs and Symptoms: Patient reports having difficulty standing for long periods. [name] [name] is a 81 y.o. male with history of DM, hypertension, chronic kidney disease, gout, diastolic dysfunction and the patient was having increasing swelling with increasing fluid draining from the legs and pain over the last 6 weeks. He says his wounds are completely healed and the home health had stopped coming to do his dressings. Recent hemoglobin A1c was 6.6. During the last admission A venous  duplex study revealed no evidence of deep vein thrombosis or superficial thrombosis involving the right and left lower extremity. Was seen in January 2016 by Dr. Adele Barthel at [facility]: Non-Invasive Vascular Imaging ABI (05/05/2014)  R: 0.65 (0.73), DP: mono, PT: mono, TBI: 0.57  L: 0.65 (0.69), DP: mono, PT: mono, TBI: 0.36 The patient had the diagnosis of bilateral lower extremity moderate peripheral atherosclerotic disease, bilateral lower extremity chronic venous insufficiency. Due to the patient's age and mental status conservative therapy was only discussed and no surgical intervention was planned. The patient has significant dementia and has a caregiver who spends a few hours every day coming to see him. He does not have any family members. Patient History [name] [name], [name] [name] ([id number]) Information obtained from Patient. Allergies No Known Allergies Family History Hypertension - Mother, Father, Stroke - Father, No family history of Cancer, Diabetes, Heart  Disease, Hereditary Spherocytosis, Kidney Disease, Lung Disease, Seizures, Thyroid Problems, Tuberculosis. Social History Former smoker, Marital Status - Widowed, Alcohol Use - Never, Drug Use - No History, Caffeine Use - Never. Medical History Hospitalization/Surgery History - 11/02/2014, [name], cellulitis. Review of Systems (ROS) Constitutional Symptoms (General Health) The patient has no complaints or symptoms. Eyes The patient has no complaints or symptoms. Ear/Nose/Mouth/Throat The patient has no complaints or symptoms. Hematologic/Lymphatic The patient has no complaints or symptoms. Respiratory The patient has no complaints or symptoms. Cardiovascular Complains or has symptoms of LE edema. Gastrointestinal The patient has no complaints or symptoms. Genitourinary Complains or has symptoms of Kidney failure/ Dialysis - CKD stage 3. Immunological The patient has no complaints or symptoms. Integumentary (Skin) The patient has no complaints or symptoms. Musculoskeletal The patient has no complaints or symptoms. Medications losartan 50 mg tablet oral tablet oral verapamil ER (PM) 200 mg capsule 24hr pellet CT,ext.release oral capsule, 24 hr ER pellet CT oral Colcrys 0.6 mg tablet oral tablet oral [place], [name] [name] ([id number]) allopurinol 300 mg tablet oral tablet oral furosemide 20 mg tablet oral tablet oral Objective Constitutional Pulse regular. Respirations normal and unlabored. Afebrile. Vitals Time Taken: 2:16 PM, Height: 70 in, Source: Stated, Weight: 230 lbs, Source: Stated, BMI: 33, Temperature: 97.5 F, Pulse: 78 bpm, Respiratory Rate: 20 breaths/min, Blood Pressure: 127/58 mmHg. Eyes Nonicteric. Reactive to light. Ears, Nose, Mouth, and Throat Lips, teeth, and gums WNL.Marland Kitchen Moist mucosa without lesions . Neck supple and nontender. No palpable supraclavicular or cervical adenopathy. Normal sized without goiter. Respiratory WNL. No retractions.. Cardiovascular Pedal  Pulses WNL. ABI was bilaterally noncompressible.. No clubbing, cyanosis or edema. Gastrointestinal (GI) Abdomen without masses or tenderness.. No liver or spleen enlargement or tenderness.. Lymphatic No adneopathy. No adenopathy. No adenopathy. Musculoskeletal Adexa without tenderness or enlargement.. Digits and nails w/o clubbing, cyanosis, infection, petechiae, ischemia, or inflammatory conditions.Marland Kitchen Psychiatric Judgement and insight Intact.. No evidence of depression, anxiety, or agitation.. General Notes: The patient has signs and symptoms of chronic stage 2 lymphedema with significant ulceration at the present time and this is present bilaterally. he has bilateral lower extremity ulcerations and of concern he has some between his toes on the left foot and also at the plantar aspect at the base of his toes. He has hyperkeratosis and dermal thickening bilaterally. RHILEY, SCHRANDT ([id number]) Integumentary (Hair, Skin) No suspicious lesions. No crepitus or fluctuance. No peri-wound warmth or erythema. No masses.. Wound #1 status is Open. Original cause of wound was Gradually Appeared. The wound is located on the Right,Circumferential Lower Leg. The wound measures 26cm length x 19cm width x 0.1cm depth; 387.987cm^2 area  and 38.799cm^3 volume. The wound is limited to skin breakdown. There is no tunneling or undermining noted. There is a large amount of purulent drainage noted. The wound margin is flat and intact. There is large (67-100%) red, pink granulation within the wound bed. The periwound skin appearance did not exhibit: Callus, Crepitus, Excoriation, Fluctuance, Friable, Induration, Localized Edema, Rash, Scarring, Dry/Scaly, Maceration, Moist, Atrophie Blanche, Cyanosis, Ecchymosis, Hemosiderin Staining, Mottled, Pallor, Rubor, Erythema. Wound #2 status is Open. Original cause of wound was Gradually Appeared. The wound is located on the Left,Circumferential Lower Leg. The wound measures  14cm length x 24cm width x 0.1cm depth; 263.894cm^2 area and 26.389cm^3 volume. The wound is limited to skin breakdown. There is no tunneling or undermining noted. There is a large amount of purulent drainage noted. The wound margin is flat and intact. There is large (67-100%) red, pink granulation within the wound bed. There is no necrotic tissue within the wound bed. The periwound skin appearance did not exhibit: Callus, Crepitus, Excoriation, Fluctuance, Friable, Induration, Localized Edema, Rash, Scarring, Dry/Scaly, Maceration, Moist, Atrophie Blanche, Cyanosis, Ecchymosis, Hemosiderin Staining, Mottled, Pallor, Rubor, Erythema. Wound #3 status is Open. Original cause of wound was Gradually Appeared. The wound is located on the [place]. The wound measures 2.4cm length x 4cm width x 0.1cm depth; 7.54cm^2 area and 0.754cm^3 volume. The wound is limited to skin breakdown. There is no tunneling or undermining noted. There is a medium amount of purulent drainage noted. The wound margin is flat and intact. There is large (67-100%) red, pink granulation within the wound bed. There is no necrotic tissue within the wound bed. The periwound skin appearance exhibited: Maceration. The periwound skin appearance did not exhibit: Callus, Crepitus, Excoriation, Fluctuance, Friable, Induration, Localized Edema, Rash, Scarring, Dry/Scaly, Moist, Atrophie Blanche, Cyanosis, Ecchymosis, Hemosiderin Staining, Mottled, Pallor, Rubor, Erythema. 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 I70.248 - Atherosclerosis of native arteries of left leg with ulceration of other part of lower left leg I87.331 - Chronic venous hypertension (idiopathic) with ulcer and inflammation of right lower extremity I87.332 -  Chronic venous hypertension (idiopathic) with ulcer and inflammation of left lower extremity [place], [name] [name] [name] [name] ([id number]) This elderly gentleman who has bilateral lower extremity lymphedema comes with a history of noncompliance and unfortunately has no family members to help him. Since he has tolerated Unna's boots in the past we will recommend silver alginate and Unna's boots bilaterally. he has a caregiver with him today and gave explained to her that it's imperative that this dressing gets changed regularly by home health and they should do it twice a week. He is also urged to come to see as an regular basis at weekly intervals. Plan Wound Cleansing: Wound #1 Right,Circumferential Lower Leg: Clean wound with Normal Saline. Cleanse wound with mild soap and water Wound #2 Left,Circumferential Lower Leg: Clean wound with Normal Saline. Cleanse wound with mild soap and water Wound #3 Left,Plantar Foot: Clean wound with Normal Saline. Cleanse wound with mild soap and water Anesthetic: Wound #1 Right,Circumferential Lower Leg: Topical Lidocaine 4% cream applied to wound bed prior to debridement Wound #2 Left,Circumferential Lower Leg: Topical Lidocaine 4% cream applied to wound bed prior to debridement Wound #3 Left,Plantar Foot: Topical Lidocaine 4% cream applied to wound bed prior to debridement Primary Wound Dressing: Wound #1 Right,Circumferential Lower Leg: Aquacel  Ag - or equivalent Wound #2 Left,Circumferential Lower Leg: Aquacel Ag - or equivalent Wound #3 Left,Plantar Foot: Aquacel Ag - or equivalent between toes on left foot also Secondary Dressing: Wound #1 Right,Circumferential Lower Leg: ABD pad XtraSorb Wound #2 Left,Circumferential Lower Leg: ABD pad XtraSorb Wound #3 Left,Plantar Foot: ABD pad XtraSorb Dressing Change Frequency: Wound #1 Right,Circumferential Lower Leg: Change Dressing Monday, Wednesday, Friday - HHRN to change wrap on Wed and Fri Wound #2  Left,Circumferential Lower Leg: ZEDDICUS, CORDRAY ([id number]) Change Dressing Monday, Wednesday, Friday - HHRN to change wrap on Wed and Fri Wound #3 Left,Plantar Foot: Change Dressing Monday, Wednesday, Friday - HHRN to change wrap on Wed and Fri Follow-up Appointments: Wound #1 Right,Circumferential Lower Leg: Return Appointment in 1 week. Wound #2 Left,Circumferential Lower Leg: Return Appointment in 1 week. Wound #3 Left,Plantar Foot: Return Appointment in 1 week. Edema Control: Wound #1 Right,Circumferential Lower Leg: Unna Boot to Left Lower Extremity Wound #2 Left,Circumferential Lower Leg: Unna Boot to Left Lower Extremity Wound #3 Left,Plantar Foot: Unna Boot to Left Lower Extremity Home Health: Wound #1 Right,Circumferential Lower Leg: [place] for [place] Nurse may visit PRN to address patient s wound care needs. FACE TO FACE ENCOUNTER: MEDICARE and MEDICAID PATIENTS: I certify that this patient is under my care and that I had a face-to-face encounter that meets the physician face-to-face encounter requirements with this patient on this date. The encounter with the patient was in whole or in part for the following MEDICAL CONDITION: (primary reason for [place]) MEDICAL NECESSITY: I certify, that based on my findings, NURSING services are a medically necessary home health service. HOME BOUND STATUS: I certify that my clinical findings support that this patient is homebound (i.e., Due to illness or injury, pt requires aid of supportive devices such as crutches, cane, wheelchairs, walkers, the use of special transportation or the assistance of another person to leave their place of residence. There is a normal inability to leave the home and doing so requires considerable and taxing effort. Other absences are for medical reasons / religious services and are infrequent or of short duration when for other reasons). If current dressing causes  regression in wound condition, may D/C ordered dressing product/s and apply Normal Saline Moist Dressing daily until next [facility] / Other MD appointment. [place] of regression in wound condition at [phone]. Please direct any NON-WOUND related issues/requests for orders to patient's Primary Care Physician Wound #2 Left,Circumferential Lower Leg: [place] for [place] Nurse may visit PRN to address patient s wound care needs. FACE TO FACE ENCOUNTER: MEDICARE and MEDICAID PATIENTS: I certify that this patient is under my care and that I had a face-to-face encounter that meets the physician face-to-face encounter requirements with this patient on this date. The encounter with the patient was in whole or in part for the following MEDICAL CONDITION: (primary reason for [place]) MEDICAL NECESSITY: I certify, that based on my findings, NURSING services are a medically necessary home health service. HOME BOUND STATUS: I certify that my clinical findings support that this patient is homebound (i.e., Due to illness or injury, pt requires aid of supportive devices such as crutches, cane, wheelchairs, walkers, the use of special transportation or the assistance of another person to leave their place of residence. There is a normal inability to leave the home and doing so requires considerable and taxing effort. Other absences are for medical reasons / religious  services and are infrequent or of short duration when for other reasons). If current dressing causes regression in wound condition, may D/C ordered dressing product/s and apply Normal Saline Moist Dressing daily until next [place] / Other MD appointment. [place] of regression in wound condition at [phone]. HILBERT, TOMARO ([id number]) Please direct any NON-WOUND related issues/requests for orders to patient's Primary Care Physician Wound #3  Left,Plantar Foot: [place] for [place] Nurse may visit PRN to address patient s wound care needs. FACE TO FACE ENCOUNTER: MEDICARE and MEDICAID PATIENTS: I certify that this patient is under my care and that I had a face-to-face encounter that meets the physician face-to-face encounter requirements with this patient on this date. The encounter with the patient was in whole or in part for the following MEDICAL CONDITION: (primary reason for [place]) MEDICAL NECESSITY: I certify, that based on my findings, NURSING services are a medically necessary home health service. HOME BOUND STATUS: I certify that my clinical findings support that this patient is homebound (i.e., Due to illness or injury, pt requires aid of supportive devices such as crutches, cane, wheelchairs, walkers, the use of special transportation or the assistance of another person to leave their place of residence. There is a normal inability to leave the home and doing so requires considerable and taxing effort. Other absences are for medical reasons / religious services and are infrequent or of short duration when for other reasons). If current dressing causes regression in wound condition, may D/C ordered dressing product/s and apply Normal Saline Moist Dressing daily until next [place] / Other MD appointment. [place] of regression in wound condition at [phone]. Please direct any NON-WOUND related issues/requests for orders to patient's Primary Care Physician This elderly gentleman who has bilateral lower extremity lymphedema comes with a history of noncompliance and unfortunately has no family members to help him. Since he has tolerated Unna's boots in the past we will recommend silver alginate and Unna's boots bilaterally. he has a caregiver with him today and gave explained to her that it's imperative that this dressing gets changed regularly by home  health and they should do it twice a week. He is also urged to come to see as an regular basis at weekly intervals. Electronic Signature(s) Signed: 04/10/2015 3:00:20 PM By: Christin Fudge MD, FACS Previous Signature: 04/09/2015 3:35:39 PM Version By: Christin Fudge MD, FACS Entered By: Christin Fudge on 04/10/2015 15:00:20 [name] [name] ([id number]) -------------------------------------------------------------------------------- ROS/PFSH Details Patient Name: [name] [name] Date of Service: 04/09/2015 2:15 PM Medical Record Number: [id number] Patient Account Number: [id number] Date of Birth/Sex: [birth year] (81 y.o. Male) Treating RN: Montey Hora Primary Care Physician: Alvester Chou Other Clinician: Referring Physician: SELF, REFERRED Treating Physician/Extender: Frann Rider in Treatment: 0 Information Obtained From Patient Wound History Cardiovascular Complaints and Symptoms: Positive for: LE edema Medical History: Positive for: Congestive Heart Failure; Hypertension Genitourinary Complaints and Symptoms: Positive for: Kidney failure/ Dialysis - CKD stage 3 Constitutional Symptoms (General Health) Complaints and Symptoms: No Complaints or Symptoms Eyes Complaints and Symptoms: No Complaints or Symptoms Ear/Nose/Mouth/Throat Complaints and Symptoms: No Complaints or Symptoms Hematologic/Lymphatic Complaints and Symptoms: No Complaints or Symptoms Respiratory Complaints and Symptoms: No Complaints or Symptoms Gastrointestinal Stclair, [name] [name] [name] [name] ([id number]) Complaints and Symptoms: No Complaints or Symptoms Endocrine Medical History: Positive for: Type II Diabetes Time with diabetes: unknown - diet controlled Immunological Complaints and Symptoms: No Complaints or Symptoms Integumentary (Skin) Complaints and Symptoms: No Complaints  or Symptoms Musculoskeletal Complaints and Symptoms: No Complaints or Symptoms Neurologic Medical History: Positive for:  Neuropathy Oncologic Medical History: Negative for: Received Chemotherapy; Received Radiation Hospitalization / Surgery History Name of Hospital Purpose of Hospitalization/Surgery Date [facility] cellulitis 11/02/2014 Family and Social History Cancer: No; Diabetes: No; Heart Disease: No; Hereditary Spherocytosis: No; Hypertension: Yes - Mother, Father; Kidney Disease: No; Lung Disease: No; Seizures: No; Stroke: Yes - Father; Thyroid Problems: No; Tuberculosis: No; Former smoker; Marital Status - Widowed; Alcohol Use: Never; Drug Use: No History; Caffeine Use: Never; Financial Concerns: No; Food, Clothing or Shelter Needs: No; Support System Lacking: No; Transportation Concerns: No; Advanced Directives: No; Patient does not want information on Advanced Directives; Living Will: No Physician Affirmation I have reviewed and agree with the above information. Electronic Signature(s) [name] [name], [name] [name] ([id number]) Signed: 04/09/2015 3:25:14 PM By: Christin Fudge MD, FACS Signed: 04/09/2015 5:57:37 PM By: Montey Hora Entered By: Christin Fudge on 04/09/2015 15:25:13 [name] [name] ([id number]) -------------------------------------------------------------------------------- SuperBill Details Patient Name: [name] [name] Date of Service: 04/09/2015 Medical Record Number: [id number] Patient Account Number: [id number] Date of Birth/Sex: [birth year] (81 y.o. 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 Physician Procedures CPT4: Description Modifier Quantity Code [id number] - WC PHYS LEVEL 4 - EST PT 1 ICD-10 Description Diagnosis E11.622 Type 2 diabetes mellitus with other skin ulcer I89.0 Lymphedema, not elsewhere classified I87.331 Chronic venous hypertension  (idiopathic) with ulcer and inflammation of right lower extremity I87.332 Chronic venous hypertension (idiopathic) with ulcer and inflammation of left lower extremity Electronic Signature(s) Signed: 04/09/2015 4:09:07 PM By: Velvet Bathe ([id number]) Signed: 04/09/2015 4:36:00 PM By: Christin Fudge MD, FACS Previous Signature: 04/09/2015 3:35:56 PM Version By: Christin Fudge MD, FACS Entered By: Montey Hora on 04/09/2015 16:09:07

## 2015-04-10 NOTE — Progress Notes (Signed)
[name] [name] ([id number]) Visit Report for 04/09/2015 Allergy List Details Patient Name: [name] [name] Date of Service: 04/09/2015 2:15 PM Medical Record Number: [id number] Patient Account Number: [id number] Date of Birth/Sex: 1926/10/13 (81 y.o. Male) Treating RN: Montey Hora Primary Care Physician: Alvester Chou Other Clinician: Referring Physician: SELF, REFERRED Treating Physician/Extender: Frann Rider in Treatment: 0 Allergies Active Allergies No Known Allergies Allergy Notes Electronic Signature(s) Signed: 04/09/2015 5:57:37 PM By: Montey Hora Entered By: Montey Hora on 04/09/2015 14:20:04 [name] [name] ([id number]) -------------------------------------------------------------------------------- Arrival Information Details Patient Name: [name] [name] Date of Service: 04/09/2015 2:15 PM Medical Record Number: [id number] Patient Account Number: [id number] Date of Birth/Sex: Jul 02, 1926 (81 y.o. Male) Treating RN: Montey Hora Primary Care Physician: Alvester Chou Other Clinician: Referring Physician: SELF, REFERRED Treating Physician/Extender: Frann Rider in Treatment: 0 Visit Information Patient Arrived: [name] [name] Arrival Time: 14:15 Accompanied By: cg Transfer Assistance: None Patient Identification Verified: Yes Secondary Verification Process Yes Completed: Patient Has Alerts: Yes Patient Alerts: DMII AIB [name] [name] >220 History Since Last Visit Added or deleted any medications: No Any new allergies or adverse reactions: No Had a fall or experienced change in activities of daily living that may affect risk of falls: No Signs or symptoms of abuse/neglect since last visito No Hospitalized since last visit: No Electronic Signature(s) Signed: 04/09/2015 5:57:37 PM By: Montey Hora Entered By: Montey Hora on 04/09/2015 14:50:17 [name] [name] ([id number]) -------------------------------------------------------------------------------- Clinic Level  of Care Assessment Details Patient Name: [name] [name] Date of Service: 04/09/2015 2:15 PM Medical Record Number: [id number] Patient Account Number: [id number] Date of Birth/Sex: 07-01-1926 (81 y.o. Male) Treating RN: Montey Hora Primary Care Physician: Alvester Chou Other Clinician: Referring Physician: SELF, REFERRED Treating Physician/Extender: Frann Rider in Treatment: 0 Clinic Level of Care Assessment Items TOOL 1 Quantity Score []  - Use when EandM and Procedure is performed on INITIAL visit 0 ASSESSMENTS - Nursing Assessment / Reassessment X - General Physical Exam (combine w/ comprehensive assessment (listed just 1 20 below) when performed on new pt. evals) X - Comprehensive Assessment (HX, ROS, Risk Assessments, Wounds Hx, etc.) 1 25 ASSESSMENTS - Wound and Skin Assessment / Reassessment []  - Dermatologic / Skin Assessment (not related to wound area) 0 ASSESSMENTS - Ostomy and/or Continence Assessment and Care []  - Incontinence Assessment and Management 0 []  - Ostomy Care Assessment and Management (repouching, etc.) 0 PROCESS - Coordination of Care X - Simple Patient / Family Education for ongoing care 1 15 []  - Complex (extensive) Patient / Family Education for ongoing care 0 X - Staff obtains Programmer, systems, Records, Test Results / Process Orders 1 10 []  - Staff telephones HHA, Nursing Homes / Clarify orders / etc 0 []  - Routine Transfer to another Facility (non-emergent condition) 0 []  - Routine Hospital Admission (non-emergent condition) 0 X - New Admissions / Biomedical engineer / Ordering NPWT, Apligraf, etc. 1 15 []  - Emergency Hospital Admission (emergent condition) 0 PROCESS - Special Needs []  - Pediatric / Minor Patient Management 0 []  - Isolation Patient Management 0 [name] [name] ([id number]) []  - Hearing / Language / Visual special needs 0 []  - Assessment of Community assistance (transportation, D/C planning, etc.) 0 []  - Additional assistance / Altered  mentation 0 []  - Support Surface(s) Assessment (bed, cushion, seat, etc.) 0 INTERVENTIONS - Miscellaneous []  - External ear exam 0 []  - Patient Transfer (multiple staff / Civil Service fast streamer / Similar devices) 0 []  - Simple Staple / Suture removal (25 or less) 0 []  - Complex Staple / Suture removal (26 or  more) 0 []  - Hypo/Hyperglycemic Management (do not check if billed separately) 0 []  - Ankle / Brachial Index (ABI) - do not check if billed separately 0 Has the patient been seen at the hospital within the last three years: Yes Total Score: 85 Level Of Care: New/Established - Level 3 Electronic Signature(s) Signed: 04/09/2015 5:57:37 PM By: Montey Hora Entered By: Montey Hora on 04/09/2015 15:08:36 [name] [name] ([id number]) -------------------------------------------------------------------------------- Encounter Discharge Information Details Patient Name: [name] [name] Date of Service: 04/09/2015 2:15 PM Medical Record Number: [id number] Patient Account Number: [id number] Date of Birth/Sex: 20-Jan-1927 (81 y.o. Male) Treating RN: Montey Hora Primary Care Physician: Alvester Chou Other Clinician: Referring Physician: SELF, REFERRED Treating Physician/Extender: Frann Rider in Treatment: 0 Encounter Discharge Information Items Discharge Pain Level: 0 Discharge Condition: Stable Ambulatory Status: Walker Discharge Destination: Home Transportation: Private Auto Accompanied By: [name] [name] Schedule Follow-up Appointment: Yes Medication Reconciliation completed and provided to Patient/Care No [name] [name]: Provided on Clinical Summary of Care: 04/09/2015 Form Type Recipient Paper Patient KL Electronic Signature(s) Signed: 04/09/2015 4:10:28 PM By: Montey Hora Previous Signature: 04/09/2015 3:42:45 PM Version By: [name] [name] Entered By: Montey Hora on 04/09/2015 16:10:28 [name] [name]  ([id number]) -------------------------------------------------------------------------------- Lower Extremity Assessment Details Patient Name: [name] [name] Date of Service: 04/09/2015 2:15 PM Medical Record Number: [id number] Patient Account Number: [id number] Date of Birth/Sex: 05-02-1926 (81 y.o. Male) Treating RN: Montey Hora Primary Care Physician: Alvester Chou Other Clinician: Referring Physician: SELF, REFERRED Treating Physician/Extender: Frann Rider in Treatment: 0 Edema Assessment Assessed: [Left: No] [Right: No] Edema: [Left: Yes] [Right: Yes] Calf Left: Right: Point of Measurement: 35 cm From Medial Instep 42.5 cm 45 cm Ankle Left: Right: Point of Measurement: 11 cm From Medial Instep 25.4 cm 28 cm Vascular Assessment Pulses: Posterior Tibial Palpable: [Left:No] [Right:No] Doppler: [Left:Monophasic] [Right:Monophasic] Dorsalis Pedis Palpable: [Left:No] [Right:No] Doppler: [Left:Monophasic] [Right:Monophasic] Extremity colors, hair growth, and conditions: Extremity Color: [Left:Hyperpigmented] [Right:Hyperpigmented] Hair Growth on Extremity: [Left:No] [Right:No] Temperature of Extremity: [Left:Warm] [Right:Warm] Capillary Refill: [Left:< 3 seconds] [Right:< 3 seconds] Toe Nail Assessment Left: Right: Thick: Yes Yes Discolored: Yes Yes Deformed: No No Improper Length and Hygiene: Yes Yes Notes ABI [place] [name] >220 PASON, BRADNEY ([id number]) Electronic Signature(s) Signed: 04/09/2015 5:57:37 PM By: Montey Hora Entered By: Montey Hora on 04/09/2015 14:49:58 [name] [name] ([id number]) -------------------------------------------------------------------------------- Multi Wound Chart Details Patient Name: [name] [name] Date of Service: 04/09/2015 2:15 PM Medical Record Number: [id number] Patient Account Number: [id number] Date of Birth/Sex: 14-Oct-1926 (81 y.o. Male) Treating RN: Montey Hora Primary Care Physician: Alvester Chou Other  Clinician: Referring Physician: SELF, REFERRED Treating Physician/Extender: Frann Rider in Treatment: 0 Vital Signs Height(in): 70 Pulse(bpm): 78 Weight(lbs): 230 Blood Pressure 127/58 (mmHg): Body Mass Index(BMI): 33 Temperature(F): 97.5 Respiratory Rate 20 (breaths/min): Photos: [1:No Photos] [2:No Photos] [3:No Photos] Wound Location: [1:Right Lower Leg - Circumfernential] [2:Left Lower Leg - Circumfernential] [3:Left Foot - Plantar] Wounding Event: [1:Gradually Appeared] [2:Gradually Appeared] [3:Gradually Appeared] Primary Etiology: [1:Lymphedema] [2:Lymphedema] [3:Lymphedema] Comorbid History: [1:Congestive Heart Failure, Hypertension, Type II Diabetes, Neuropathy] [2:Congestive Heart Failure, Hypertension, Type II Diabetes, Neuropathy] [3:Congestive Heart Failure, Hypertension, Type II Diabetes, Neuropathy] Date Acquired: [1:12/28/2014] [2:12/28/2014] [3:01/27/2015] Weeks of Treatment: [1:0] [2:0] [3:0] Wound Status: [1:Open] [2:Open] [3:Open] Clustered Wound: [1:Yes] [2:Yes] [3:No] Measurements L x W x D 26x19x0.1 [2:14x24x0.1] [3:2.4x4x0.1] (cm) Area (cm) : [1:387.987] [2:263.894] [3:7.54] Volume (cm) : [1:38.799] [2:26.389] [3:0.754] Classification: [1:Partial Thickness] [2:Partial Thickness] [3:Partial Thickness] HBO Classification: [1:Grade 1] [2:Grade 1] [3:Grade 1] Exudate Amount: [1:Large] [2:Large] [3:Medium] Exudate Type: [1:Purulent] [2:Purulent] [3:Purulent] Exudate Color: [  1:yellow, brown, green] [2:yellow, brown, green] [3:yellow, brown, green] Wound Margin: [1:Flat and Intact] [2:Flat and Intact] [3:Flat and Intact] Granulation Amount: [1:Large (67-100%)] [2:Large (67-100%)] [3:Large (67-100%)] Granulation Quality: [1:Red, Pink] [2:Red, Pink] [3:Red, Pink] Necrotic Amount: [1:N/A] [2:None Present (0%)] [3:None Present (0%)] Exposed Structures: [1:Fascia: No Fat: No Tendon: No Muscle: No Joint: No] [2:Fascia: No Fat: No Tendon: No Muscle: No Joint:  No] [3:Fascia: No Fat: No Tendon: No Muscle: No Joint: No] Bone: No Bone: No Bone: No Limited to Skin Limited to Skin Limited to Skin Breakdown Breakdown Breakdown Epithelialization: Small (1-33%) Small (1-33%) None Periwound Skin Texture: Edema: No Edema: No Edema: No Excoriation: No Excoriation: No Excoriation: No Induration: No Induration: No Induration: No Callus: No Callus: No Callus: No Crepitus: No Crepitus: No Crepitus: No Fluctuance: No Fluctuance: No Fluctuance: No Friable: No Friable: No Friable: No Rash: No Rash: No Rash: No Scarring: No Scarring: No Scarring: No Periwound Skin Maceration: No Maceration: No Maceration: No Moisture: Moist: No Moist: No Moist: No Dry/Scaly: No Dry/Scaly: No Dry/Scaly: No Periwound Skin Color: Atrophie Blanche: No Atrophie Blanche: No Atrophie Blanche: No Cyanosis: No Cyanosis: No Cyanosis: No Ecchymosis: No Ecchymosis: No Ecchymosis: No Erythema: No Erythema: No Erythema: No Hemosiderin Staining: No Hemosiderin Staining: No Hemosiderin Staining: No Mottled: No Mottled: No Mottled: No Pallor: No Pallor: No Pallor: No Rubor: No Rubor: No Rubor: No Tenderness on No No No Palpation: Wound Preparation: Ulcer Cleansing: Other: Ulcer Cleansing: Other: Ulcer Cleansing: Other: soap and water soap and water soap and water Topical Anesthetic Topical Anesthetic Topical Anesthetic Applied: None Applied: None Applied: None Treatment Notes Electronic Signature(s) Signed: 04/09/2015 5:57:37 PM By: Montey Hora Entered By: Montey Hora on 04/09/2015 15:03:02 [name] [name] ([id number]) -------------------------------------------------------------------------------- Multi-Disciplinary Care Plan Details Patient Name: [name] [name] Date of Service: 04/09/2015 2:15 PM Medical Record Number: [id number] Patient Account Number: [id number] Date of Birth/Sex: Apr 16, 1927 (81 y.o. Male) Treating RN: Montey Hora Primary Care Physician: Alvester Chou Other Clinician: Referring Physician: SELF, REFERRED Treating Physician/Extender: Frann Rider in Treatment: 0 Active Inactive Abuse / Safety / Falls / Self Care Management Nursing Diagnoses: Impaired physical mobility Potential for falls Goals: Patient will remain injury free Date Initiated: 04/09/2015 Goal Status: Active Interventions: Assess fall risk on admission and as needed Notes: Orientation to the Wound Care Program Nursing Diagnoses: Knowledge deficit related to the wound healing center program Goals: Patient/caregiver will verbalize understanding of the [facility] Date Initiated: 04/09/2015 Goal Status: Active Interventions: Provide education on orientation to the wound center Notes: Venous Leg Ulcer Nursing Diagnoses: Actual venous Insuffiency (use after diagnosis is confirmed) Goals: Patient will maintain optimal edema control HIRAN, HARNESS ([id number]) Date Initiated: 04/09/2015 Goal Status: Active Interventions: Assess peripheral edema status every visit. Provide education on venous insufficiency Notes: Wound/Skin Impairment Nursing Diagnoses: Impaired tissue integrity Goals: Patient/caregiver will verbalize understanding of skin care regimen Date Initiated: 04/09/2015 Goal Status: Active Ulcer/skin breakdown will have a volume reduction of 30% by week 4 Date Initiated: 04/09/2015 Goal Status: Active Ulcer/skin breakdown will have a volume reduction of 50% by week 8 Date Initiated: 04/09/2015 Goal Status: Active Ulcer/skin breakdown will have a volume reduction of 80% by week 12 Date Initiated: 04/09/2015 Goal Status: Active Ulcer/skin breakdown will heal within 14 weeks Date Initiated: 04/09/2015 Goal Status: Active Interventions: Assess ulceration(s) every visit Notes: Electronic Signature(s) Signed: 04/09/2015 5:57:37 PM By: Montey Hora Entered By: Montey Hora on  04/09/2015 15:02:43 [name] [name] ([id number]) -------------------------------------------------------------------------------- Patient/Caregiver Education Details Patient Name: [name] [name]  Date of Service: 04/09/2015 2:15 PM Medical Record Number: [id number] Patient Account Number: [id number] Date of Birth/Gender: 06/29/1926 (81 y.o. Male) Treating RN: Montey Hora Primary Care Physician: Alvester Chou Other Clinician: Referring Physician: SELF, REFERRED Treating Physician/Extender: Frann Rider in Treatment: 0 Education Assessment Education Provided To: Patient and Caregiver Education Topics Provided Wound/Skin Impairment: Handouts: Other: wound care as ordered Methods: Demonstration, Explain/Verbal Responses: State content correctly Electronic Signature(s) Signed: 04/09/2015 4:10:44 PM By: Montey Hora Entered By: Montey Hora on 04/09/2015 16:10:44 [name] [name] ([id number]) -------------------------------------------------------------------------------- Wound Assessment Details Patient Name: [name] [name] Date of Service: 04/09/2015 2:15 PM Medical Record Number: [id number] Patient Account Number: [id number] Date of Birth/Sex: April 02, 1927 (81 y.o. Male) Treating RN: Montey Hora Primary Care Physician: Alvester Chou Other Clinician: Referring Physician: SELF, REFERRED Treating Physician/Extender: Frann Rider in Treatment: 0 Wound Status Wound Number: 1 Primary Lymphedema Etiology: Wound Location: Right Lower Leg - Circumfernential Wound Open Status: Wounding Event: Gradually Appeared Comorbid Congestive Heart Failure, Date Acquired: 12/28/2014 History: Hypertension, Type II Diabetes, Weeks Of Treatment: 0 Neuropathy Clustered Wound: Yes Photos Wound Measurements Length: (cm) 26 Width: (cm) 19 Depth: (cm) 0.1 Area: (cm) 387.987 Volume: (cm) 38.799 % Reduction in Area: 0% % Reduction in Volume: 0% Epithelialization: Small (1-33%) Tunneling:  No Undermining: No Wound Description Classification: Partial Thickness Diabetic Severity (Wagner): Grade 1 Wound Margin: Flat and Intact Exudate Amount: Large Exudate Type: Purulent Exudate Color: yellow, brown, green Foul Odor After Cleansing: No Wound Bed Granulation Amount: Large (67-100%) Exposed Structure Granulation Quality: Red, Pink Fascia Exposed: No Fat Layer Exposed: No Salonga, Nahun ([id number]) Tendon Exposed: No Muscle Exposed: No Joint Exposed: No Bone Exposed: No Limited to Skin Breakdown Periwound Skin Texture Texture Color No Abnormalities Noted: No No Abnormalities Noted: No Callus: No Atrophie Blanche: No Crepitus: No Cyanosis: No Excoriation: No Ecchymosis: No Fluctuance: No Erythema: No Friable: No Hemosiderin Staining: No Induration: No Mottled: No Localized Edema: No Pallor: No Rash: No Rubor: No Scarring: No Moisture No Abnormalities Noted: No Dry / Scaly: No Maceration: No Moist: No Wound Preparation Ulcer Cleansing: Other: soap and water, Topical Anesthetic Applied: None Treatment Notes Wound #1 (Right, Circumferential Lower Leg) 1. Cleansed with: Cleanse wound with antibacterial soap and water 4. Dressing Applied: Aquacel Ag Other dressing (specify in notes) 5. Secondary Dressing Applied ABD Pad 7. Secured with [employer] Bilaterally Notes xtrasorb Engineer, maintenance) Signed: 04/09/2015 5:53:26 PM By: Montey Hora Previous Signature: 04/09/2015 5:52:46 PM Version By: Montey Hora Entered By: Montey Hora on 04/09/2015 17:53:26 Ardito, Chrissie [name] ([id number]) JAVOHN, DAWSON ([id number]) -------------------------------------------------------------------------------- Wound Assessment Details Patient Name: [name] [name] Date of Service: 04/09/2015 2:15 PM Medical Record Number: [id number] Patient Account Number: [id number] Date of Birth/Sex: [birth year] (81 y.o. Male) Treating RN: Montey Hora Primary Care Physician:  Alvester Chou Other Clinician: Referring Physician: SELF, REFERRED Treating Physician/Extender: Frann Rider in Treatment: 0 Wound Status Wound Number: 2 Primary Lymphedema Etiology: Wound Location: Left Lower Leg - Circumfernential Wound Open Status: Wounding Event: Gradually Appeared Comorbid Congestive Heart Failure, Date Acquired: 12/28/2014 History: Hypertension, Type II Diabetes, Weeks Of Treatment: 0 Neuropathy Clustered Wound: Yes Photos Wound Measurements Length: (cm) 14 Width: (cm) 24 Depth: (cm) 0.1 Area: (cm) 263.894 Volume: (cm) 26.389 % Reduction in Area: 0% % Reduction in Volume: 0% Epithelialization: Small (1-33%) Tunneling: No Undermining: No Wound Description Classification: Partial Thickness Foul Odor Afte Diabetic Severity Earleen Newport): Grade 1 Bodner, Zaviyar ([id number]) r Cleansing: No Wound Margin: Flat and Intact Exudate Amount: Large Exudate Type: Purulent Exudate Color: yellow, brown,  green Wound Bed Granulation Amount: Large (67-100%) Exposed Structure Granulation Quality: Red, Pink Fascia Exposed: No Necrotic Amount: None Present (0%) Fat Layer Exposed: No Tendon Exposed: No Muscle Exposed: No Joint Exposed: No Bone Exposed: No Limited to Skin Breakdown Periwound Skin Texture Texture Color No Abnormalities Noted: No No Abnormalities Noted: No Callus: No Atrophie Blanche: No Crepitus: No Cyanosis: No Excoriation: No Ecchymosis: No Fluctuance: No Erythema: No Friable: No Hemosiderin Staining: No Induration: No Mottled: No Localized Edema: No Pallor: No Rash: No Rubor: No Scarring: No Moisture No Abnormalities Noted: No Dry / Scaly: No Maceration: No Moist: No Wound Preparation Ulcer Cleansing: Other: soap and water, Topical Anesthetic Applied: None Treatment Notes Wound #2 (Left, Circumferential Lower Leg) 1. Cleansed with: Cleanse wound with antibacterial soap and water 4. Dressing Applied: Aquacel  Ag Other dressing (specify in notes) 5. Secondary Dressing Applied ABD Pad Jaeger, Chrissie [name] ([id number]) 7. Secured with [employer] Bilaterally Notes xtrasorb Engineer, maintenance) Signed: 04/09/2015 5:56:51 PM By: Montey Hora Previous Signature: 04/09/2015 5:54:05 PM Version By: Montey Hora Entered By: Montey Hora on 04/09/2015 17:56:51 [name] [name] ([id number]) -------------------------------------------------------------------------------- Wound Assessment Details Patient Name: [name] [name] Date of Service: 04/09/2015 2:15 PM Medical Record Number: [id number] Patient Account Number: [id number] Date of Birth/Sex: [birth year] (81 y.o. Male) Treating RN: Montey Hora Primary Care Physician: Alvester Chou Other Clinician: Referring Physician: SELF, REFERRED Treating Physician/Extender: Frann Rider in Treatment: 0 Wound Status Wound Number: 3 Primary Lymphedema Etiology: Wound Location: Left Foot - Plantar Wound Open Wounding Event: Gradually Appeared Status: Date Acquired: 01/27/2015 Comorbid Congestive Heart Failure, Weeks Of Treatment: 0 History: Hypertension, Type II Diabetes, Clustered Wound: No Neuropathy Photos Photo Uploaded By: Montey Hora on 04/09/2015 17:51:26 Wound Measurements Length: (cm) 2.4 Width: (cm) 4 Depth: (cm) 0.1 Area: (cm) 7.54 Volume: (cm) 0.754 % Reduction in Area: 0% % Reduction in Volume: 0% Epithelialization: None Tunneling: No Undermining: No Wound Description Classification: Partial Thickness Diabetic Severity (Wagner): Grade 1 Wound Margin: Flat and Intact Exudate Amount: Medium Exudate Type: Purulent Exudate Color: yellow, brown, green Foul Odor After Cleansing: No Wound Bed Granulation Amount: Large (67-100%) Exposed Structure Granulation Quality: Red, Pink Fascia Exposed: No Necrotic Amount: None Present (0%) Fat Layer Exposed: No Sedam, Jehan ([id number]) Tendon Exposed: No Muscle Exposed: No Joint  Exposed: No Bone Exposed: No Limited to Skin Breakdown Periwound Skin Texture Texture Color No Abnormalities Noted: No No Abnormalities Noted: No Callus: No Atrophie Blanche: No Crepitus: No Cyanosis: No Excoriation: No Ecchymosis: No Fluctuance: No Erythema: No Friable: No Hemosiderin Staining: No Induration: No Mottled: No Localized Edema: No Pallor: No Rash: No Rubor: No Scarring: No Moisture No Abnormalities Noted: No Dry / Scaly: No Maceration: Yes Moist: No Wound Preparation Ulcer Cleansing: Other: soap and water, Topical Anesthetic Applied: None Treatment Notes Wound #3 (Left, Plantar Foot) 1. Cleansed with: Cleanse wound with antibacterial soap and water 4. Dressing Applied: Aquacel Ag 5. Secondary Dressing Applied Gauze and Kerlix/Conform 7. Secured with Recruitment consultant) Signed: 04/09/2015 5:57:37 PM By: Montey Hora Entered By: Montey Hora on 04/09/2015 15:05:17 [name] [name] ([id number]) -------------------------------------------------------------------------------- Vitals Details Patient Name: [name] [name] Date of Service: 04/09/2015 2:15 PM Medical Record Number: [id number] Patient Account Number: [id number] Date of Birth/Sex: June 24, 1926 (81 y.o. Male) Treating RN: Montey Hora Primary Care Physician: Alvester Chou Other Clinician: Referring Physician: SELF, REFERRED Treating Physician/Extender: Frann Rider in Treatment: 0 Vital Signs Time Taken: 14:16 Temperature (F): 97.5 Height (in): 70 Pulse (bpm): 78 Source: Stated Respiratory Rate (breaths/min): 20 Weight (lbs):  230 Blood Pressure (mmHg): 127/58 Source: Stated Reference Range: 80 - 120 mg / dl Body Mass Index (BMI): 33 Electronic Signature(s) Signed: 04/09/2015 5:57:37 PM By: Montey Hora Entered By: Montey Hora on 04/09/2015 14:19:18

## 2015-04-16 ENCOUNTER — Encounter: Payer: Medicare Other | Admitting: Surgery

## 2015-04-16 DIAGNOSIS — E11622 Type 2 diabetes mellitus with other skin ulcer: Secondary | ICD-10-CM | POA: Diagnosis not present

## 2015-04-19 NOTE — Progress Notes (Signed)
TYLER, BLECH ([id number]) Visit Report for 04/16/2015 Arrival Information Details Patient Name: [name] [name] Date of Service: 04/16/2015 2:45 PM Medical Record Number: [id number] Patient Account Number: [id number] Date of Birth/Sex: 06-Nov-1926 (81 y.o. Male) Treating RN: [name] [name] Primary Care Physician: Alvester Chou Other Clinician: Referring Physician: Alvester Chou Treating Physician/Extender: Frann Rider in Treatment: 1 Visit Information History Since Last Visit Any new allergies or adverse reactions: No Patient Arrived: [name] Had a fall or experienced change in No Arrival Time: 14:54 activities of daily living that may affect Accompanied By: caregiver risk of falls: Transfer Assistance: None Signs or symptoms of abuse/neglect since last No Patient Identification Verified: Yes visito Secondary Verification Yes Hospitalized since last visit: No Process Completed: Has Dressing in Place as Prescribed: Yes Patient Has Alerts: Yes Has Compression in Place as Prescribed: Yes Patient Alerts: DMII Has Footwear/Offloading in Place as No AIB [place] BILATERAL Prescribed: >220 Pain Present Now: No Electronic Signature(s) Signed: 04/18/2015 4:52:55 PM By: Gretta Cool, RN, BSN, Kim RN, BSN Entered By: Gretta Cool, RN, BSN, Kim on 04/16/2015 14:55:08 [name] [name] ([id number]) -------------------------------------------------------------------------------- Encounter Discharge Information Details Patient Name: [name] [name] Date of Service: 04/16/2015 2:45 PM Medical Record Number: [id number] Patient Account Number: [id number] Date of Birth/Sex: [birth year] (81 y.o. Male) Treating RN: [name] [name] Primary Care Physician: Alvester Chou Other Clinician: Referring Physician: Alvester Chou Treating Physician/Extender: Frann Rider in Treatment: 1 Encounter Discharge Information Items Schedule Follow-up Appointment: No Medication Reconciliation completed and provided to Patient/Care  No [name] [name]: Provided on Clinical Summary of Care: 04/16/2015 Form Type Recipient Paper Patient KL Electronic Signature(s) Signed: 04/16/2015 3:33:36 PM By: Ruthine Dose Entered By: Ruthine Dose on 04/16/2015 15:33:36 [name] [name] ([id number]) -------------------------------------------------------------------------------- Lower Extremity Assessment Details Patient Name: [name] [name] Date of Service: 04/16/2015 2:45 PM Medical Record Number: [id number] Patient Account Number: [id number] Date of Birth/Sex: November 25, 1926 (81 y.o. Male) Treating RN: [name] [name] Primary Care Physician: Alvester Chou Other Clinician: Referring Physician: Alvester Chou Treating Physician/Extender: Frann Rider in Treatment: 1 Edema Assessment Assessed: [Left: No] [Right: No] E[Left: dema] [Right: :] Calf Left: Right: Point of Measurement: 35 cm From Medial Instep 38 cm 40.4 cm Ankle Left: Right: Point of Measurement: 11 cm From Medial Instep 26 cm 27 cm Vascular Assessment Pulses: Posterior Tibial Dorsalis Pedis Palpable: [Left:No] [Right:No] Doppler: [Left:Monophasic] [Right:Monophasic] Extremity colors, hair growth, and conditions: Extremity Color: [Left:Hyperpigmented] [Right:Hyperpigmented] Hair Growth on Extremity: [Left:No] [Right:No] Temperature of Extremity: [Left:Warm] [Right:Warm] Capillary Refill: [Left:> 3 seconds] [Right:> 3 seconds] Toe Nail Assessment Left: Right: Thick: Yes Yes Discolored: Yes Yes Deformed: No No Improper Length and Hygiene: Yes Yes Electronic Signature(s) Signed: 04/18/2015 4:52:55 PM By: Gretta Cool, RN, BSN, Kim RN, BSN Entered By: Gretta Cool, RN, BSN, Kim on 04/16/2015 15:03:41 [name] [name], [name] [name] ([id number]) [name] [name], [name] [name] ([id number]) -------------------------------------------------------------------------------- Multi Wound Chart Details Patient Name: [name] [name] Date of Service: 04/16/2015 2:45 PM Medical Record Number: [id number] Patient Account Number:  [id number] Date of Birth/Sex: 1926/11/02 (81 y.o. Male) Treating RN: [name] [name] Primary Care Physician: Alvester Chou Other Clinician: Referring Physician: Alvester Chou Treating Physician/Extender: Frann Rider in Treatment: 1 Vital Signs Height(in): 70 Pulse(bpm): 87 Weight(lbs): 230 Blood Pressure 148/75 (mmHg): Body Mass Index(BMI): 33 Temperature(F): 97.7 Respiratory Rate 18 (breaths/min): Photos: [1:No Photos] [2:No Photos] [3:No Photos] Wound Location: [1:Right Lower Leg - Circumfernential] [2:Left Lower Leg - Circumfernential] [3:Left Foot - Plantar] Wounding Event: [1:Gradually Appeared] [2:Gradually Appeared] [3:Gradually Appeared] Primary Etiology: [1:Lymphedema] [2:Lymphedema] [3:Lymphedema] Comorbid History: [1:Congestive Heart Failure, Hypertension, Type II Diabetes, Neuropathy] [2:Congestive Heart Failure, Hypertension, Type II  Diabetes, Neuropathy] [3:Congestive Heart Failure, Hypertension, Type II Diabetes, Neuropathy] Date Acquired: [1:12/28/2014] [2:12/28/2014] [3:01/27/2015] Weeks of Treatment: [1:1] [2:1] [3:1] Wound Status: [1:Open] [2:Open] [3:Open] Clustered Wound: [1:Yes] [2:Yes] [3:No] Measurements L x W x D 12x7x0.1 [2:10x12x0.1] [3:3.5x4x0.1] (cm) Area (cm) : [1:65.973] [2:94.248] [3:10.996] Volume (cm) : [1:6.597] [2:9.425] [3:1.1] % Reduction in Area: [1:83.00%] [2:64.30%] [3:-45.80%] % Reduction in Volume: 83.00% [2:64.30%] [3:-45.90%] Classification: [1:Partial Thickness] [2:Partial Thickness] [3:Partial Thickness] HBO Classification: [1:Grade 1] [2:Grade 1] [3:Grade 1] Exudate Amount: [1:Large] [2:Large] [3:Medium] Exudate Type: [1:Purulent] [2:Purulent] [3:Purulent] Exudate Color: [1:yellow, brown, green] [2:yellow, brown, green] [3:yellow, brown, green] Wound Margin: [1:Flat and Intact] [2:Flat and Intact] [3:Flat and Intact] Granulation Amount: [1:Large (67-100%)] [2:Large (67-100%)] [3:Medium (34-66%)] Granulation Quality: [1:Red, Pink]  [2:Red, Pink] [3:Pale] Necrotic Amount: [1:None Present (0%)] [2:None Present (0%)] [3:Medium (34-66%)] Exposed Structures: [1:Fascia: No Fat: No Tendon: No] [2:Fascia: No Fat: No Tendon: No] [3:Fascia: No Fat: No Tendon: No] Muscle: No Muscle: No Muscle: No Joint: No Joint: No Joint: No Bone: No Bone: No Bone: No Limited to Skin Limited to Skin Limited to Skin Breakdown Breakdown Breakdown Epithelialization: Small (1-33%) Small (1-33%) None Periwound Skin Texture: Edema: No Edema: No Edema: No Excoriation: No Excoriation: No Excoriation: No Induration: No Induration: No Induration: No Callus: No Callus: No Callus: No Crepitus: No Crepitus: No Crepitus: No Fluctuance: No Fluctuance: No Fluctuance: No Friable: No Friable: No Friable: No Rash: No Rash: No Rash: No Scarring: No Scarring: No Scarring: No Periwound Skin Maceration: No Maceration: No Maceration: Yes Moisture: Moist: No Moist: No Moist: No Dry/Scaly: No Dry/Scaly: No Dry/Scaly: No Periwound Skin Color: Atrophie Blanche: No Atrophie Blanche: No Atrophie Blanche: No Cyanosis: No Cyanosis: No Cyanosis: No Ecchymosis: No Ecchymosis: No Ecchymosis: No Erythema: No Erythema: No Erythema: No Hemosiderin Staining: No Hemosiderin Staining: No Hemosiderin Staining: No Mottled: No Mottled: No Mottled: No Pallor: No Pallor: No Pallor: No Rubor: No Rubor: No Rubor: No Tenderness on No No No Palpation: Wound Preparation: Ulcer Cleansing: Other: Ulcer Cleansing: Other: Ulcer Cleansing: Other: soap and water soap and water soap and water Topical Anesthetic Topical Anesthetic Topical Anesthetic Applied: None Applied: None Applied: None Treatment Notes Electronic Signature(s) Signed: 04/18/2015 4:52:55 PM By: Gretta Cool, RN, BSN, Kim RN, BSN Entered By: Gretta Cool, RN, BSN, Kim on 04/16/2015 15:08:03 [name] [name]  ([id number]) -------------------------------------------------------------------------------- Multi-Disciplinary Care Plan Details Patient Name: [name] [name] Date of Service: 04/16/2015 2:45 PM Medical Record Number: [id number] Patient Account Number: [id number] Date of Birth/Sex: 04-Nov-1926 (81 y.o. Male) Treating RN: [name] [name] Primary Care Physician: Alvester Chou Other Clinician: Referring Physician: Alvester Chou Treating Physician/Extender: Frann Rider in Treatment: 1 Active Inactive Abuse / Safety / Falls / Self Care Management Nursing Diagnoses: Impaired physical mobility Potential for falls Goals: Patient will remain injury free Date Initiated: 04/09/2015 Goal Status: Active Interventions: Assess fall risk on admission and as needed Notes: Orientation to the Wound Care Program Nursing Diagnoses: Knowledge deficit related to the wound healing center program Goals: Patient/caregiver will verbalize understanding of the [place] Program Date Initiated: 04/09/2015 Goal Status: Active Interventions: Provide education on orientation to the wound center Notes: Venous Leg Ulcer Nursing Diagnoses: Actual venous Insuffiency (use after diagnosis is confirmed) Goals: Patient will maintain optimal edema control JYDEN, BABULA ([id number]) Date Initiated: 04/09/2015 Goal Status: Active Interventions: Assess peripheral edema status every visit. Provide education on venous insufficiency Notes: Wound/Skin Impairment Nursing Diagnoses: Impaired tissue integrity Goals: Patient/caregiver will verbalize understanding of skin care regimen Date Initiated: 04/09/2015 Goal Status: Active Ulcer/skin  breakdown will have a volume reduction of 30% by week 4 Date Initiated: 04/09/2015 Goal Status: Active Ulcer/skin breakdown will have a volume reduction of 50% by week 8 Date Initiated: 04/09/2015 Goal Status: Active Ulcer/skin breakdown will have a volume reduction of  80% by week 12 Date Initiated: 04/09/2015 Goal Status: Active Ulcer/skin breakdown will heal within 14 weeks Date Initiated: 04/09/2015 Goal Status: Active Interventions: Assess ulceration(s) every visit Notes: Electronic Signature(s) Signed: 04/18/2015 4:52:55 PM By: Gretta Cool, RN, BSN, Kim RN, BSN Entered By: Gretta Cool, RN, BSN, Kim on 04/16/2015 15:07:55 [name] [name] ([id number]) -------------------------------------------------------------------------------- Pain Assessment Details Patient Name: [name] [name] Date of Service: 04/16/2015 2:45 PM Medical Record Number: [id number] Patient Account Number: [id number] Date of Birth/Sex: 10-27-1926 (81 y.o. Male) Treating RN: [name] [name] Primary Care Physician: Alvester Chou Other Clinician: Referring Physician: Alvester Chou Treating Physician/Extender: Frann Rider in Treatment: 1 Active Problems Location of Pain Severity and Description of Pain Patient Has Paino No Site Locations Pain Management and Medication Current Pain Management: Electronic Signature(s) Signed: 04/18/2015 4:52:55 PM By: Gretta Cool, RN, BSN, Kim RN, BSN Entered By: Gretta Cool, RN, BSN, Kim on 04/16/2015 14:55:14 [name] [name] ([id number]) -------------------------------------------------------------------------------- Patient/Caregiver Education Details Patient Name: [name] [name] Date of Service: 04/16/2015 2:45 PM Medical Record Number: [id number] Patient Account Number: [id number] Date of Birth/Gender: Nov 18, 1926 (81 y.o. Male) Treating RN: [name] [name] Primary Care Physician: Alvester Chou Other Clinician: Referring Physician: Alvester Chou Treating Physician/Extender: Frann Rider in Treatment: 1 Education Assessment Education Provided To: Patient Education Topics Provided Wound/Skin Impairment: Handouts: Caring for Your Ulcer, Other: continue wound care as prescribed Methods: Demonstration, Explain/Verbal Responses: State content correctly Electronic  Signature(s) Signed: 04/18/2015 4:52:55 PM By: Gretta Cool, RN, BSN, Kim RN, BSN Entered By: Gretta Cool, RN, BSN, Kim on 04/16/2015 15:34:04 [name] [name] ([id number]) -------------------------------------------------------------------------------- Wound Assessment Details Patient Name: [name] [name] Date of Service: 04/16/2015 2:45 PM Medical Record Number: [id number] Patient Account Number: [id number] Date of Birth/Sex: 1926/07/29 (81 y.o. Male) Treating RN: [name] [name] Primary Care Physician: Alvester Chou Other Clinician: Referring Physician: Alvester Chou Treating Physician/Extender: Frann Rider in Treatment: 1 Wound Status Wound Number: 1 Primary Lymphedema Etiology: Wound Location: Right Lower Leg - Circumfernential Wound Open Status: Wounding Event: Gradually Appeared Comorbid Congestive Heart Failure, Date Acquired: 12/28/2014 History: Hypertension, Type II Diabetes, Weeks Of Treatment: 1 Neuropathy Clustered Wound: Yes Photos Photo Uploaded By: Gretta Cool, RN, BSN, Kim on 04/16/2015 17:30:49 Wound Measurements Length: (cm) 12 Width: (cm) 7 Depth: (cm) 0.1 Area: (cm) 65.973 Volume: (cm) 6.597 % Reduction in Area: 83% % Reduction in Volume: 83% Epithelialization: Small (1-33%) Wound Description Classification: Partial Thickness Foul Odor Afte Diabetic Severity Earleen Newport): Grade 1 Wound Margin: Flat and Intact Exudate Amount: Large Flanagan, [name] [name] ([id number]) r Cleansing: No Exudate Type: Purulent Exudate Color: yellow, brown, green Wound Bed Granulation Amount: Large (67-100%) Exposed Structure Granulation Quality: Red, Pink Fascia Exposed: No Necrotic Amount: None Present (0%) Fat Layer Exposed: No Tendon Exposed: No Muscle Exposed: No Joint Exposed: No Bone Exposed: No Limited to Skin Breakdown Periwound Skin Texture Texture Color No Abnormalities Noted: No No Abnormalities Noted: No Callus: No Atrophie Blanche: No Crepitus: No Cyanosis: No Excoriation:  No Ecchymosis: No Fluctuance: No Erythema: No Friable: No Hemosiderin Staining: No Induration: No Mottled: No Localized Edema: No Pallor: No Rash: No Rubor: No Scarring: No Moisture No Abnormalities Noted: No Dry / Scaly: No Maceration: No Moist: No Wound Preparation Ulcer Cleansing: Other: soap and water, Topical Anesthetic Applied: None Treatment Notes Wound #1 (Right, Circumferential Lower Leg) 1.  Cleansed with: Clean wound with Normal Saline 2. Anesthetic Topical Lidocaine 4% cream to wound bed prior to debridement 4. Dressing Applied: Aquacel Ag 5. Secondary Dressing Applied ABD Pad 7. Secured with [name] [name], [name] [name] ([id number]) [name] [name] Bilaterally Electronic Signature(s) Signed: 04/18/2015 4:52:55 PM By: Gretta Cool, RN, BSN, Kim RN, BSN Entered By: Gretta Cool, RN, BSN, Kim on 04/16/2015 15:06:31 [name] [name] ([id number]) -------------------------------------------------------------------------------- Wound Assessment Details Patient Name: [name] [name] Date of Service: 04/16/2015 2:45 PM Medical Record Number: [id number] Patient Account Number: [id number] Date of Birth/Sex: Sep 15, 1926 (81 y.o. Male) Treating RN: [name] [name] Primary Care Physician: Alvester Chou Other Clinician: Referring Physician: Alvester Chou Treating Physician/Extender: Frann Rider in Treatment: 1 Wound Status Wound Number: 2 Primary Lymphedema Etiology: Wound Location: Left Lower Leg - Circumfernential Wound Open Status: Wounding Event: Gradually Appeared Comorbid Congestive Heart Failure, Date Acquired: 12/28/2014 History: Hypertension, Type II Diabetes, Weeks Of Treatment: 1 Neuropathy Clustered Wound: Yes Photos Photo Uploaded By: Gretta Cool, RN, BSN, Kim on 04/16/2015 17:30:49 Wound Measurements Length: (cm) 10 Width: (cm) 12 Depth: (cm) 0.1 Area: (cm) 94.248 Volume: (cm) 9.425 % Reduction in Area: 64.3% % Reduction in Volume: 64.3% Epithelialization: Small (1-33%) Wound  Description Classification: Partial Thickness Diabetic Severity (Wagner): Grade 1 Wound Margin: Flat and Intact Exudate Amount: Large Exudate Type: Purulent Exudate Color: yellow, brown, green Foul Odor After Cleansing: No Wound Bed Granulation Amount: Large (67-100%) Exposed Structure Granulation Quality: Red, Pink Fascia Exposed: No [name] [name], [name] [name] ([id number]) Necrotic Amount: None Present (0%) Fat Layer Exposed: No Tendon Exposed: No Muscle Exposed: No Joint Exposed: No Bone Exposed: No Limited to Skin Breakdown Periwound Skin Texture Texture Color No Abnormalities Noted: No No Abnormalities Noted: No Callus: No Atrophie Blanche: No Crepitus: No Cyanosis: No Excoriation: No Ecchymosis: No Fluctuance: No Erythema: No Friable: No Hemosiderin Staining: No Induration: No Mottled: No Localized Edema: No Pallor: No Rash: No Rubor: No Scarring: No Moisture No Abnormalities Noted: No Dry / Scaly: No Maceration: No Moist: No Wound Preparation Ulcer Cleansing: Other: soap and water, Topical Anesthetic Applied: None Treatment Notes Wound #2 (Left, Circumferential Lower Leg) 1. Cleansed with: Clean wound with Normal Saline 2. Anesthetic Topical Lidocaine 4% cream to wound bed prior to debridement 4. Dressing Applied: Aquacel Ag 5. Secondary Dressing Applied ABD Pad 7. Secured with Water engineer) Signed: 04/18/2015 4:52:55 PM By: Gretta Cool, RN, BSN, Kim RN, BSN Entered By: Gretta Cool, RN, BSN, Kim on 04/16/2015 15:06:45 [name] [name], [name] [name] ([id number]) [name] [name], [name] [name] ([id number]) -------------------------------------------------------------------------------- Wound Assessment Details Patient Name: [name] [name] Date of Service: 04/16/2015 2:45 PM Medical Record Number: [id number] Patient Account Number: [id number] Date of Birth/Sex: 1926/11/23 (81 y.o. Male) Treating RN: [name] [name] Primary Care Physician: Alvester Chou Other Clinician: Referring  Physician: Alvester Chou Treating Physician/Extender: Frann Rider in Treatment: 1 Wound Status Wound Number: 3 Primary Lymphedema Etiology: Wound Location: Left Foot - Plantar Wound Open Wounding Event: Gradually Appeared Status: Date Acquired: 01/27/2015 Comorbid Congestive Heart Failure, Weeks Of Treatment: 1 History: Hypertension, Type II Diabetes, Clustered Wound: No Neuropathy Photos Photo Uploaded By: Gretta Cool, RN, BSN, Kim on 04/16/2015 17:31:14 Wound Measurements Length: (cm) 3.5 Width: (cm) 4 Depth: (cm) 0.1 Area: (cm) 10.996 Volume: (cm) 1.1 % Reduction in Area: -45.8% % Reduction in Volume: -45.9% Epithelialization: None Wound Description Classification: Partial Thickness Diabetic Severity Earleen Newport): Grade 1 Wound Margin: Flat and Intact Exudate Amount: Medium Exudate Type: Purulent Exudate Color: yellow, brown, green Foul Odor After Cleansing: No Wound Bed Granulation Amount: Medium (34-66%) Exposed Structure Granulation Quality: Pale Fascia Exposed:  No Necrotic Amount: Medium (34-66%) Fat Layer Exposed: No [name] [name], [name] [name] ([id number]) Necrotic Quality: Adherent Slough Tendon Exposed: No Muscle Exposed: No Joint Exposed: No Bone Exposed: No Limited to Skin Breakdown Periwound Skin Texture Texture Color No Abnormalities Noted: No No Abnormalities Noted: No Callus: No Atrophie Blanche: No Crepitus: No Cyanosis: No Excoriation: No Ecchymosis: No Fluctuance: No Erythema: No Friable: No Hemosiderin Staining: No Induration: No Mottled: No Localized Edema: No Pallor: No Rash: No Rubor: No Scarring: No Moisture No Abnormalities Noted: No Dry / Scaly: No Maceration: Yes Moist: No Wound Preparation Ulcer Cleansing: Other: soap and water, Topical Anesthetic Applied: None Treatment Notes Wound #3 (Left, Plantar Foot) 1. Cleansed with: Clean wound with Normal Saline 2. Anesthetic Topical Lidocaine 4% cream to wound bed prior to  debridement 4. Dressing Applied: Aquacel Ag 5. Secondary Dressing Applied ABD Pad 7. Secured with Water engineer) Signed: 04/18/2015 4:52:55 PM By: Gretta Cool, RN, BSN, Kim RN, BSN Entered By: Gretta Cool, RN, BSN, Kim on 04/16/2015 15:07:11 [name] [name] ([id number]) -------------------------------------------------------------------------------- Vitals Details Patient Name: [name] [name] Date of Service: 04/16/2015 2:45 PM Medical Record Number: [id number] Patient Account Number: [id number] Date of Birth/Sex: 1926-12-14 (81 y.o. Male) Treating RN: [name] [name] Primary Care Physician: Alvester Chou Other Clinician: Referring Physician: Alvester Chou Treating Physician/Extender: Frann Rider in Treatment: 1 Vital Signs Time Taken: 14:55 Temperature (F): 97.7 Height (in): 70 Pulse (bpm): 87 Weight (lbs): 230 Respiratory Rate (breaths/min): 18 Body Mass Index (BMI): 33 Blood Pressure (mmHg): 148/75 Reference Range: 80 - 120 mg / dl Electronic Signature(s) Signed: 04/18/2015 4:52:55 PM By: Gretta Cool, RN, BSN, Kim RN, BSN Entered By: Gretta Cool, RN, BSN, Kim on 04/16/2015 14:56:02

## 2015-04-19 NOTE — Progress Notes (Signed)
BENSON, RILE ([id number]) Visit Report for 04/16/2015 Chief Complaint Document Details Patient Name: [name] [name], [name] [name] Date of Service: 04/16/2015 2:45 PM Medical Record Number: [id number] Patient Account Number: [id number] Date of Birth/Sex: April 06, 1927 (81 y.o. Male) Treating RN: Cornell Barman Primary Care Physician: Alvester Chou Other Clinician: Referring Physician: Alvester Chou Treating Physician/Extender: Frann Rider in Treatment: 1 Information Obtained from: Patient Chief Complaint Patient presents to the wound care center for a consult due non healing wound both lower extremities. The patient is a poor historian and was seen here once in July 2016. He says home health was doing his dressings and he was healed but now has recurrence of the problem. Problem has been there at least for 6 months. Electronic Signature(s) Signed: 04/17/2015 3:49:48 PM By: Christin Fudge MD, FACS Entered By: Christin Fudge on 04/17/2015 15:49:48 [name] [name] ([id number]) -------------------------------------------------------------------------------- HPI Details Patient Name: [name] [name] Date of Service: 04/16/2015 2:45 PM Medical Record Number: [id number] Patient Account Number: [id number] Date of Birth/Sex: [birth year] (81 y.o. Male) Treating RN: Cornell Barman Primary Care Physician: Alvester Chou Other Clinician: Referring Physician: Alvester Chou Treating Physician/Extender: Frann Rider in Treatment: 1 History of Present Illness Location: swelling of the legs right more than left an ulceration both lower extremities Quality: Patient reports experiencing a dull pain to affected area(s). Severity: Patient states wound (s) are getting worse Duration: Patient has had the wound for > 6 months prior to seeking treatment at the wound center Timing: Pain in wound is Intermittent. Context: The wound appeared gradually over time Modifying Factors: he has been having home health come and apply his dressing  daily was healed. Associated Signs and Symptoms: Patient reports having difficulty standing for long periods. HPI Description: [name] [name] is a 81 y.o. male with history of DM, hypertension, chronic kidney disease, gout, diastolic dysfunction and the patient was having increasing swelling with increasing fluid draining from the legs and pain over the last 6 weeks. He says his wounds are completely healed and the home health had stopped coming to do his dressings. Recent hemoglobin A1c was 6.6. During the last admission A venous duplex study revealed no evidence of deep vein thrombosis or superficial thrombosis involving the right and left lower extremity. Was seen in January 2016 by Dr. Adele Barthel at [facility]: Non-Invasive Vascular Imaging ABI (05/05/2014) o R: 0.65 (0.73), DP: mono, PT: mono, TBI: 0.57 o L: 0.65 (0.69), DP: mono, PT: mono, TBI: 0.36 The patient had the diagnosis of bilateral lower extremity moderate peripheral atherosclerotic disease, bilateral lower extremity chronic venous insufficiency. Due to the patient's age and mental status conservative therapy was only discussed and no surgical intervention was planned. The patient has significant dementia and has a caregiver who spends a few hours every day coming to see him. He does not have any family members. Electronic Signature(s) Signed: 04/17/2015 3:49:54 PM By: Christin Fudge MD, FACS Entered By: Christin Fudge on 04/17/2015 15:49:54 [name] [name] ([id number]) -------------------------------------------------------------------------------- Physical Exam Details Patient Name: [name] [name] Date of Service: 04/16/2015 2:45 PM Medical Record Number: [id number] Patient Account Number: [id number] Date of Birth/Sex: 07-21-1926 (81 y.o. Male) Treating RN: Cornell Barman Primary Care Physician: Alvester Chou Other Clinician: Referring Physician: Alvester Chou Treating Physician/Extender: Frann Rider in Treatment: 1 Constitutional .  Pulse regular. Respirations normal and unlabored. Afebrile. . Eyes Nonicteric. Reactive to light. Ears, Nose, Mouth, and Throat Lips, teeth, and gums WNL.Marland Kitchen Moist mucosa without lesions. Neck supple and nontender. No palpable supraclavicular or cervical adenopathy. Normal sized without goiter. Respiratory  WNL. No retractions.. Cardiovascular Pedal Pulses WNL. No clubbing, cyanosis or edema. Lymphatic No adneopathy. No adenopathy. No adenopathy. Musculoskeletal Adexa without tenderness or enlargement.. Digits and nails w/o clubbing, cyanosis, infection, petechiae, ischemia, or inflammatory conditions.. Integumentary (Hair, Skin) No suspicious lesions. No crepitus or fluctuance. No peri-wound warmth or erythema. No masses.Marland Kitchen Psychiatric Judgement and insight Intact.. No evidence of depression, anxiety, or agitation.. Notes his lymphedema is much better than last week and the ulcerations of gone down significantly bilaterally. The area on the base of his left toes on the plantar aspect is also looking much better. Electronic Signature(s) Signed: 04/17/2015 3:50:46 PM By: Christin Fudge MD, FACS Entered By: Christin Fudge on 04/17/2015 15:50:45 [name] [name] ([id number]) -------------------------------------------------------------------------------- Physician Orders Details Patient Name: [name] [name] Date of Service: 04/16/2015 2:45 PM Medical Record Number: [id number] Patient Account Number: [id number] Date of Birth/Sex: 05-14-1926 (81 y.o. Male) Treating RN: Cornell Barman Primary Care Physician: Alvester Chou Other Clinician: Referring Physician: Alvester Chou Treating Physician/Extender: Frann Rider in Treatment: 1 Verbal / Phone Orders: Yes Clinician: Cornell Barman Read Back and Verified: Yes Diagnosis Coding Wound Cleansing Wound #1 Right,Circumferential Lower Leg o Clean wound with Normal Saline. o Cleanse wound with mild soap and water Wound #2 Left,Circumferential Lower  Leg o Clean wound with Normal Saline. o Cleanse wound with mild soap and water Wound #3 Left,Plantar Foot o Clean wound with Normal Saline. o Cleanse wound with mild soap and water Primary Wound Dressing Wound #1 Right,Circumferential Lower Leg o Aquacel Ag - or equivalent Wound #2 Left,Circumferential Lower Leg o Aquacel Ag - or equivalent Wound #3 Left,Plantar Foot o Aquacel Ag - or equivalent Secondary Dressing Wound #1 Right,Circumferential Lower Leg o ABD pad Wound #2 Left,Circumferential Lower Leg o ABD pad Wound #3 Left,Plantar Foot o ABD pad Dressing Change Frequency Wound #1 Right,Circumferential Lower Leg o Change Dressing Monday, Wednesday, Friday - HHRN to change wrap on Wed and Fri BIKO, ROSENSTEIN ([id number]) Wound #2 Left,Circumferential Lower Leg o Change Dressing Monday, Wednesday, Friday - HHRN to change wrap on Wed and Fri Wound #3 Left,Plantar Foot o Change Dressing Monday, Wednesday, Friday - HHRN to change wrap on Wed and Fri Follow-up Appointments Wound #1 Right,Circumferential Lower Leg o Return Appointment in 1 week. Wound #2 Left,Circumferential Lower Leg o Return Appointment in 1 week. Wound #3 Left,Plantar Foot o Return Appointment in 1 week. Edema Control Wound #1 Right,Circumferential Lower Leg o Unna Boot to Left Lower Extremity Wound #2 Left,Circumferential Lower Leg o Unna Boot to Left Lower Extremity Wound #3 Left,Plantar Foot o Unna Boot to Left Lower Extremity Home Health Wound #1 Right,Circumferential Lower Leg o [place] for [place] Nurse may visit PRN to address patientos wound care needs. o FACE TO FACE ENCOUNTER: MEDICARE and MEDICAID PATIENTS: I certify that this patient is under my care and that I had a face-to-face encounter that meets the physician face-to-face encounter requirements with this patient on this date. The  encounter with the patient was in whole or in part for the following MEDICAL CONDITION: (primary reason for [place]) MEDICAL NECESSITY: I certify, that based on my findings, NURSING services are a medically necessary home health service. HOME BOUND STATUS: I certify that my clinical findings support that this patient is homebound (i.e., Due to illness or injury, pt requires aid of supportive devices such as crutches, cane, wheelchairs, walkers, the use of special transportation or the assistance of another person to leave  their place of residence. There is a normal inability to leave the home and doing so requires considerable and taxing effort. Other absences are for medical reasons / religious services and are infrequent or of short duration when for other reasons). o If current dressing causes regression in wound condition, may D/C ordered dressing product/s and apply Normal Saline Moist Dressing daily until next [place] / Other MD appointment. [place] of regression in wound condition at [phone]. CHRISTERPHER, KISHI ([id number]) o Please direct any NON-WOUND related issues/requests for orders to patient's Primary Care Physician Wound #2 Left,Circumferential Lower Leg o [place] for [place] Nurse may visit PRN to address patientos wound care needs. o FACE TO FACE ENCOUNTER: MEDICARE and MEDICAID PATIENTS: I certify that this patient is under my care and that I had a face-to-face encounter that meets the physician face-to-face encounter requirements with this patient on this date. The encounter with the patient was in whole or in part for the following MEDICAL CONDITION: (primary reason for [place]) MEDICAL NECESSITY: I certify, that based on my findings, NURSING services are a medically necessary home health service. HOME BOUND STATUS: I certify that my clinical  findings support that this patient is homebound (i.e., Due to illness or injury, pt requires aid of supportive devices such as crutches, cane, wheelchairs, walkers, the use of special transportation or the assistance of another person to leave their place of residence. There is a normal inability to leave the home and doing so requires considerable and taxing effort. Other absences are for medical reasons / religious services and are infrequent or of short duration when for other reasons). o If current dressing causes regression in wound condition, may D/C ordered dressing product/s and apply Normal Saline Moist Dressing daily until next [place] / Other MD appointment. [place] of regression in wound condition at [phone]. o Please direct any NON-WOUND related issues/requests for orders to patient's [place] for [place] Nurse may visit PRN to address patientos wound care needs. o FACE TO FACE ENCOUNTER: MEDICARE and MEDICAID PATIENTS: I certify that this patient is under my care and that I had a face-to-face encounter that meets the physician face-to-face encounter requirements with this patient on this date. The encounter with the patient was in whole or in part for the following MEDICAL CONDITION: (primary reason for [place]) MEDICAL NECESSITY: I certify, that based on my findings, NURSING services are a medically necessary home health service. HOME BOUND STATUS: I certify that my clinical findings support that this patient is homebound (i.e., Due to illness or injury, pt requires aid of supportive devices such as crutches, cane, wheelchairs, walkers, the use of special transportation or the assistance of another person to leave their place of residence. There is a normal inability to leave the home and doing so requires considerable and taxing effort. Other absences are for  medical reasons / religious services and are infrequent or of short duration when for other reasons). o If current dressing causes regression in wound condition, may D/C ordered dressing product/s and apply Normal Saline Moist Dressing daily until next [place] / Other MD appointment. [place] of regression in wound condition at [phone]. o Please direct any NON-WOUND related issues/requests for orders to patient's Primary Care Physician Wound #3 [place] for [place]  o [place] Nurse may visit PRN to address patientos wound care needs. o FACE TO FACE ENCOUNTER: MEDICARE and MEDICAID PATIENTS: I certify that this patient is under my care and that I had a face-to-face encounter that meets the physician face-to-face SERGI, PICKRON ([id number]) encounter requirements with this patient on this date. The encounter with the patient was in whole or in part for the following MEDICAL CONDITION: (primary reason for [place]) MEDICAL NECESSITY: I certify, that based on my findings, NURSING services are a medically necessary home health service. HOME BOUND STATUS: I certify that my clinical findings support that this patient is homebound (i.e., Due to illness or injury, pt requires aid of supportive devices such as crutches, cane, wheelchairs, walkers, the use of special transportation or the assistance of another person to leave their place of residence. There is a normal inability to leave the home and doing so requires considerable and taxing effort. Other absences are for medical reasons / religious services and are infrequent or of short duration when for other reasons). o If current dressing causes regression in wound condition, may D/C ordered dressing product/s and apply Normal Saline Moist Dressing daily until next [place] / Other MD appointment. [place] of regression in wound condition at [phone]. o Please direct any NON-WOUND related issues/requests for orders to patient's Primary Care Physician Electronic Signature(s) Signed: 04/17/2015 4:08:32 PM By: Christin Fudge MD, FACS Signed: 04/18/2015 4:52:55 PM By: Gretta Cool RN, BSN, Kim RN, BSN Entered By: Gretta Cool, RN, BSN, Kim on 04/16/2015 15:16:49 [name] [name] ([id number]) -------------------------------------------------------------------------------- Problem List Details Patient Name: [name] [name] Date of Service: 04/16/2015 2:45 PM Medical Record Number: [id number] Patient Account Number: [id number] Date of Birth/Sex: June 15, 1926 (81 y.o. Male) Treating RN: Cornell Barman Primary Care Physician: Alvester Chou Other Clinician: Referring Physician: Alvester Chou Treating Physician/Extender: Frann Rider in Treatment: 1 Active Problems ICD-10 Encounter Code Description Active Date Diagnosis E11.622 Type 2 diabetes mellitus with other skin ulcer 04/09/2015 Yes I89.0 Lymphedema, not elsewhere classified 04/09/2015 Yes I70.238 Atherosclerosis of native arteries of right leg with 04/09/2015 Yes ulceration of other part of lower right leg I70.248 Atherosclerosis of native arteries of left leg with ulceration 04/09/2015 Yes of other part of lower left leg I87.331 Chronic venous hypertension (idiopathic) with ulcer and 04/09/2015 Yes inflammation of right lower extremity I87.332 Chronic venous hypertension (idiopathic) with ulcer and 04/09/2015 Yes inflammation of left lower extremity Inactive Problems Resolved Problems Electronic Signature(s) Signed: 04/17/2015 3:49:40 PM By: Christin Fudge MD, FACS Entered By: Christin Fudge on 04/17/2015 15:49:40 [name] [name] ([id number]) -------------------------------------------------------------------------------- Progress Note Details Patient Name: [name] [name] Date of Service: 04/16/2015 2:45 PM Medical Record Number:  [id number] Patient Account Number: [id number] Date of Birth/Sex: 05-17-1926 (81 y.o. Male) Treating RN: Cornell Barman Primary Care Physician: Alvester Chou Other Clinician: Referring Physician: Alvester Chou Treating Physician/Extender: Frann Rider in Treatment: 1 Subjective Chief Complaint Information obtained from Patient Patient presents to the wound care center for a consult due non healing wound both lower extremities. The patient is a poor historian and was seen here once in July 2016. He says home health was doing his dressings and he was healed but now has recurrence of the problem. Problem has been there at least for 6 months. History of Present Illness (HPI) The following HPI elements were documented for the patient's wound: Location: swelling of the legs right more than left an ulceration both lower extremities Quality: Patient reports experiencing a  dull pain to affected area(s). Severity: Patient states wound (s) are getting worse Duration: Patient has had the wound for > 6 months prior to seeking treatment at the wound center Timing: Pain in wound is Intermittent. Context: The wound appeared gradually over time Modifying Factors: he has been having home health come and apply his dressing daily was healed. Associated Signs and Symptoms: Patient reports having difficulty standing for long periods. Charon Brandes is a 81 y.o. male with history of DM, hypertension, chronic kidney disease, gout, diastolic dysfunction and the patient was having increasing swelling with increasing fluid draining from the legs and pain over the last 6 weeks. He says his wounds are completely healed and the home health had stopped coming to do his dressings. Recent hemoglobin A1c was 6.6. During the last admission A venous duplex study revealed no evidence of deep vein thrombosis or superficial thrombosis involving the right and left lower extremity. Was seen in January 2016 by Dr. Adele Barthel at  [facility]: Non-Invasive Vascular Imaging ABI (05/05/2014)  R: 0.65 (0.73), DP: mono, PT: mono, TBI: 0.57  L: 0.65 (0.69), DP: mono, PT: mono, TBI: 0.36 The patient had the diagnosis of bilateral lower extremity moderate peripheral atherosclerotic disease, bilateral lower extremity chronic venous insufficiency. Due to the patient's age and mental status conservative therapy was only discussed and no surgical intervention was planned. The patient has significant dementia and has a caregiver who spends a few hours every day coming to see him. He does not have any family members. KEERAN, PREVAL ([id number]) Objective Constitutional Pulse regular. Respirations normal and unlabored. Afebrile. Vitals Time Taken: 2:55 PM, Height: 70 in, Weight: 230 lbs, BMI: 33, Temperature: 97.7 F, Pulse: 87 bpm, Respiratory Rate: 18 breaths/min, Blood Pressure: 148/75 mmHg. Eyes Nonicteric. Reactive to light. Ears, Nose, Mouth, and Throat Lips, teeth, and gums WNL.Marland Kitchen Moist mucosa without lesions. Neck supple and nontender. No palpable supraclavicular or cervical adenopathy. Normal sized without goiter. Respiratory WNL. No retractions.. Cardiovascular Pedal Pulses WNL. No clubbing, cyanosis or edema. Lymphatic No adneopathy. No adenopathy. No adenopathy. Musculoskeletal Adexa without tenderness or enlargement.. Digits and nails w/o clubbing, cyanosis, infection, petechiae, ischemia, or inflammatory conditions.Marland Kitchen Psychiatric Judgement and insight Intact.. No evidence of depression, anxiety, or agitation.. General Notes: his lymphedema is much better than last week and the ulcerations of gone down significantly bilaterally. The area on the base of his left toes on the plantar aspect is also looking much better. Integumentary (Hair, Skin) No suspicious lesions. No crepitus or fluctuance. No peri-wound warmth or erythema. No masses.. Wound #1 status is Open. Original cause of wound was Gradually Appeared. The  wound is located on the Right,Circumferential Lower Leg. The wound measures 12cm length x 7cm width x 0.1cm depth; 65.973cm^2 area and 6.597cm^3 volume. The wound is limited to skin breakdown. There is a large amount of purulent drainage noted. The wound margin is flat and intact. There is large (67-100%) red, pink granulation within the wound bed. There is no necrotic tissue within the wound bed. The periwound skin Tumblin, Anna ([id number]) appearance did not exhibit: Callus, Crepitus, Excoriation, Fluctuance, Friable, Induration, Localized Edema, Rash, Scarring, Dry/Scaly, Maceration, Moist, Atrophie Blanche, Cyanosis, Ecchymosis, Hemosiderin Staining, Mottled, Pallor, Rubor, Erythema. Wound #2 status is Open. Original cause of wound was Gradually Appeared. The wound is located on the Left,Circumferential Lower Leg. The wound measures 10cm length x 12cm width x 0.1cm depth; 94.248cm^2 area and 9.425cm^3 volume. The wound is limited to skin breakdown. There is a large amount of  purulent drainage noted. The wound margin is flat and intact. There is large (67-100%) red, pink granulation within the wound bed. There is no necrotic tissue within the wound bed. The periwound skin appearance did not exhibit: Callus, Crepitus, Excoriation, Fluctuance, Friable, Induration, Localized Edema, Rash, Scarring, Dry/Scaly, Maceration, Moist, Atrophie Blanche, Cyanosis, Ecchymosis, Hemosiderin Staining, Mottled, Pallor, Rubor, Erythema. Wound #3 status is Open. Original cause of wound was Gradually Appeared. The wound is located on the [place]. The wound measures 3.5cm length x 4cm width x 0.1cm depth; 10.996cm^2 area and 1.1cm^3 volume. The wound is limited to skin breakdown. There is a medium amount of purulent drainage noted. The wound margin is flat and intact. There is medium (34-66%) pale granulation within the wound bed. There is a medium (34-66%) amount of necrotic tissue within the wound bed  including Adherent Slough. The periwound skin appearance exhibited: Maceration. The periwound skin appearance did not exhibit: Callus, Crepitus, Excoriation, Fluctuance, Friable, Induration, Localized Edema, Rash, Scarring, Dry/Scaly, Moist, Atrophie Blanche, Cyanosis, Ecchymosis, Hemosiderin Staining, Mottled, Pallor, Rubor, Erythema. 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 I70.248 - Atherosclerosis of native arteries of left leg with ulceration of other part of lower left leg I87.331 - Chronic venous hypertension (idiopathic) with ulcer and inflammation of right lower extremity I87.332 - Chronic venous hypertension (idiopathic) with ulcer and inflammation of left lower extremity Plan Wound Cleansing: Wound #1 Right,Circumferential Lower Leg: Clean wound with Normal Saline. Cleanse wound with mild soap and water Wound #2 Left,Circumferential Lower Leg: Ambrocio, [name] ([id number]) Clean wound with Normal Saline. Cleanse wound with mild soap and water Wound #3 Left,Plantar Foot: Clean wound with Normal Saline. Cleanse wound with mild soap and water Primary Wound Dressing: Wound #1 Right,Circumferential Lower Leg: Aquacel Ag - or equivalent Wound #2 Left,Circumferential Lower Leg: Aquacel Ag - or equivalent Wound #3 Left,Plantar Foot: Aquacel Ag - or equivalent Secondary Dressing: Wound #1 Right,Circumferential Lower Leg: ABD pad Wound #2 Left,Circumferential Lower Leg: ABD pad Wound #3 Left,Plantar Foot: ABD pad Dressing Change Frequency: Wound #1 Right,Circumferential Lower Leg: Change Dressing Monday, Wednesday, Friday - HHRN to change wrap on Wed and Fri Wound #2 Left,Circumferential Lower Leg: Change Dressing Monday, Wednesday, Friday - HHRN to change wrap on Wed and  Fri Wound #3 Left,Plantar Foot: Change Dressing Monday, Wednesday, Friday - HHRN to change wrap on Wed and Fri Follow-up Appointments: Wound #1 Right,Circumferential Lower Leg: Return Appointment in 1 week. Wound #2 Left,Circumferential Lower Leg: Return Appointment in 1 week. Wound #3 Left,Plantar Foot: Return Appointment in 1 week. Edema Control: Wound #1 Right,Circumferential Lower Leg: Unna Boot to Left Lower Extremity Wound #2 Left,Circumferential Lower Leg: Unna Boot to Left Lower Extremity Wound #3 Left,Plantar Foot: Unna Boot to Left Lower Extremity Home Health: Wound #1 Right,Circumferential Lower Leg: [place] for [place] Nurse may visit PRN to address patient s wound care needs. FACE TO FACE ENCOUNTER: MEDICARE and MEDICAID PATIENTS: I certify that this patient is under my care and that I had a face-to-face encounter that meets the physician face-to-face encounter requirements with this patient on this date. The encounter with the patient was in whole or in part for the following MEDICAL CONDITION: (primary reason for [place]) MEDICAL NECESSITY: I certify, that based on my findings, NURSING services are  a medically necessary home health service. HOME BOUND STATUS: I certify that my clinical findings support that this patient is homebound (i.e., Due to ANIK, KOWALIK ([id number]) illness or injury, pt requires aid of supportive devices such as crutches, cane, wheelchairs, walkers, the use of special transportation or the assistance of another person to leave their place of residence. There is a normal inability to leave the home and doing so requires considerable and taxing effort. Other absences are for medical reasons / religious services and are infrequent or of short duration when for other reasons). If current dressing causes regression in wound condition, may D/C ordered dressing product/s and  apply Normal Saline Moist Dressing daily until next [place] / Other MD appointment. [place] of regression in wound condition at [phone]. Please direct any NON-WOUND related issues/requests for orders to patient's Primary Care Physician Wound #2 Left,Circumferential Lower Leg: [place] for [place] Nurse may visit PRN to address patient s wound care needs. FACE TO FACE ENCOUNTER: MEDICARE and MEDICAID PATIENTS: I certify that this patient is under my care and that I had a face-to-face encounter that meets the physician face-to-face encounter requirements with this patient on this date. The encounter with the patient was in whole or in part for the following MEDICAL CONDITION: (primary reason for [place]) MEDICAL NECESSITY: I certify, that based on my findings, NURSING services are a medically necessary home health service. HOME BOUND STATUS: I certify that my clinical findings support that this patient is homebound (i.e., Due to illness or injury, pt requires aid of supportive devices such as crutches, cane, wheelchairs, walkers, the use of special transportation or the assistance of another person to leave their place of residence. There is a normal inability to leave the home and doing so requires considerable and taxing effort. Other absences are for medical reasons / religious services and are infrequent or of short duration when for other reasons). If current dressing causes regression in wound condition, may D/C ordered dressing product/s and apply Normal Saline Moist Dressing daily until next [place] / Other MD appointment. [place] of regression in wound condition at [phone]. Please direct any NON-WOUND related issues/requests for orders to patient's Primary Care Physician [place] for [place] Nurse may visit PRN to  address patient s wound care needs. FACE TO FACE ENCOUNTER: MEDICARE and MEDICAID PATIENTS: I certify that this patient is under my care and that I had a face-to-face encounter that meets the physician face-to-face encounter requirements with this patient on this date. The encounter with the patient was in whole or in part for the following MEDICAL CONDITION: (primary reason for [place]) MEDICAL NECESSITY: I certify, that based on my findings, NURSING services are a medically necessary home health service. HOME BOUND STATUS: I certify that my clinical findings support that this patient is homebound (i.e., Due to illness or injury, pt requires aid of supportive devices such as crutches, cane, wheelchairs, walkers, the use of special transportation or the assistance of another person to leave their place of residence. There is a normal inability to leave the home and doing so requires considerable and taxing effort. Other absences are for medical reasons / religious services and are infrequent or of short duration when for other reasons). If current dressing causes regression in wound condition, may D/C ordered dressing product/s and apply Normal Saline Moist Dressing daily until [facility] Other [facility] appointment. [place] of regression in wound condition at [phone]. Please direct any NON-WOUND related issues/requests for orders to patient's Primary Care Physician Wound #3 Left,Plantar Foot: [place] for [place] Nurse may visit PRN to address patient s wound care needs. FACE TO FACE ENCOUNTER: MEDICARE and MEDICAID PATIENTS: I certify that this patient is under my care and that I had a face-to-face encounter that meets the physician face-to-face encounter requirements with this patient on this date. The encounter with the patient was in whole or in part for the following MEDICAL CONDITION:  (primary reason for [place]) MEDICAL NECESSITY: I certify, that based on my findings, NURSING services are a medically necessary home health service. HOME [place], JERRILL ([id number]) BOUND STATUS: I certify that my clinical findings support that this patient is homebound (i.e., Due to illness or injury, pt requires aid of supportive devices such as crutches, cane, wheelchairs, walkers, the use of special transportation or the assistance of another person to leave their place of residence. There is a normal inability to leave the home and doing so requires considerable and taxing effort. Other absences are for medical reasons / religious services and are infrequent or of short duration when for other reasons). If current dressing causes regression in wound condition, may D/C ordered dressing product/s and apply Normal Saline Moist Dressing daily until next [place] / Other MD appointment. [place] of regression in wound condition at [phone]. Please direct any NON-WOUND related issues/requests for orders to patient's Primary Care Physician We will recommend silver alginate and Unna's boots bilaterally. He has a caregiver with him today and gave explained to her that it's imperative that this dressing gets changed regularly by home health and they should do it twice a week. He is also urged to come to see as an regular basis at weekly intervals. Electronic Signature(s) Signed: 04/17/2015 3:51:27 PM By: Christin Fudge MD, FACS Entered By: Christin Fudge on 04/17/2015 15:51:27 [name] [name] ([id number]) -------------------------------------------------------------------------------- SuperBill Details Patient Name: [name] [name] Date of Service: 04/16/2015 Medical Record Number: [id number] Patient Account Number: [id number] Date of Birth/Sex: [birth year] (81 y.o. 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 left leg Chronic venous hypertension (idiopathic) with ulcer and inflammation of right lower I87.331 extremity Chronic venous hypertension (idiopathic) with ulcer and inflammation of left lower I87.332 extremity Facility Procedures CPT4 Code: [id number] Description: 29580 - APPLY UNNA BOOT/PROFO BILATERAL Modifier: Quantity: 1 Physician Procedures CPT4: Description Modifier Quantity Code [id number] - WC PHYS LEVEL 3 - EST PT 1 ICD-10 Description Diagnosis E11.622 Type 2 diabetes mellitus with other skin ulcer I89.0 Lymphedema, not elsewhere classified I70.238 Atherosclerosis of native arteries  of right leg with ulceration of other part of lower right leg I70.248 Atherosclerosis of native arteries of left leg with ulceration of other part of lower left leg Electronic Signature(s) Signed: 04/17/2015 3:51:54 PM By: Christin Fudge MD, FACS Entered By: Christin Fudge on 04/17/2015 15:51:53

## 2015-04-24 ENCOUNTER — Encounter: Payer: Self-pay | Admitting: General Surgery

## 2015-04-24 ENCOUNTER — Encounter (HOSPITAL_BASED_OUTPATIENT_CLINIC_OR_DEPARTMENT_OTHER): Payer: Medicare Other | Admitting: General Surgery

## 2015-04-24 DIAGNOSIS — L03119 Cellulitis of unspecified part of limb: Secondary | ICD-10-CM | POA: Diagnosis not present

## 2015-04-24 DIAGNOSIS — I889 Nonspecific lymphadenitis, unspecified: Secondary | ICD-10-CM | POA: Diagnosis not present

## 2015-04-24 DIAGNOSIS — L03115 Cellulitis of right lower limb: Secondary | ICD-10-CM | POA: Diagnosis not present

## 2015-04-24 DIAGNOSIS — E11622 Type 2 diabetes mellitus with other skin ulcer: Secondary | ICD-10-CM | POA: Diagnosis not present

## 2015-04-24 DIAGNOSIS — L03116 Cellulitis of left lower limb: Secondary | ICD-10-CM

## 2015-04-24 DIAGNOSIS — I7025 Atherosclerosis of native arteries of other extremities with ulceration: Secondary | ICD-10-CM | POA: Diagnosis not present

## 2015-04-24 NOTE — Progress Notes (Signed)
seeiheal 

## 2015-04-25 NOTE — Progress Notes (Addendum)
BOBBYE, SAPIA ([id number]) Visit Report for 04/24/2015 Chief Complaint Document Details Patient Name: [name] [name], [name] [name] Date of Service: 04/24/2015 3:00 PM Medical Record Number: [id number] Patient Account Number: [id number] Date of Birth/Sex: 1926/06/24 (81 y.o. Male) Treating RN: Baruch Gouty, RN, BSN, Velva Harman Primary Care Physician: Alvester Chou Other Clinician: Referring Physician: Alvester Chou Treating Physician/Extender: Benjaman Pott in Treatment: 2 Information Obtained from: Patient Chief Complaint Patient presents to the wound care center for a consult due non healing wound both lower extremities. The patient is a poor historian and was seen here once in July 2016. He says home health was doing his dressings and he was healed but now has recurrence of the problem. Problem has been there at least for 6 months. Electronic Signature(s) Signed: 04/24/2015 3:32:30 PM By: Judene Companion MD Entered By: Judene Companion on 04/24/2015 15:32:30 [name] [name] ([id number]) -------------------------------------------------------------------------------- HPI Details Patient Name: [name] [name] Date of Service: 04/24/2015 3:00 PM Medical Record Number: [id number] Patient Account Number: [id number] Date of Birth/Sex: 22-Apr-1927 (81 y.o. Male) Treating RN: Baruch Gouty, RN, BSN, Velva Harman Primary Care Physician: Alvester Chou Other Clinician: Referring Physician: Alvester Chou Treating Physician/Extender: Benjaman Pott in Treatment: 2 History of Present Illness Location: swelling of the legs right more than left an ulceration both lower extremities Quality: Patient reports experiencing a dull pain to affected area(s). Severity: Patient states wound (s) are getting worse Duration: Patient has had the wound for > 6 months prior to seeking treatment at the wound center Timing: Pain in wound is Intermittent. Context: The wound appeared gradually over time Modifying Factors: he has been having home health come and apply  his dressing daily was healed. Associated Signs and Symptoms: Patient reports having difficulty standing for long periods. HPI Description: [name] [name] is a 81 y.o. male with history of DM, hypertension, chronic kidney disease, gout, diastolic dysfunction and the patient was having increasing swelling with increasing fluid draining from the legs and pain over the last 6 weeks. He says his wounds are completely healed and the home health had stopped coming to do his dressings. Recent hemoglobin A1c was 6.6. During the last admission A venous duplex study revealed no evidence of deep vein thrombosis or superficial thrombosis involving the right and left lower extremity. Was seen in January 2016 by Dr. Adele Barthel at [facility]: Non-Invasive Vascular Imaging ABI (05/05/2014) o R: 0.65 (0.73), DP: mono, PT: mono, TBI: 0.57 o L: 0.65 (0.69), DP: mono, PT: mono, TBI: 0.36 The patient had the diagnosis of bilateral lower extremity moderate peripheral atherosclerotic disease, bilateral lower extremity chronic venous insufficiency. Due to the patient's age and mental status conservative therapy was only discussed and no surgical intervention was planned. The patient has significant dementia and has a caregiver who spends a few hours every day coming to see him. He does not have any family members. Electronic Signature(s) Signed: 04/24/2015 3:33:00 PM By: Judene Companion MD Entered By: Judene Companion on 04/24/2015 15:33:00 [name] [name] ([id number]) -------------------------------------------------------------------------------- Physical Exam Details Patient Name: [name] [name] Date of Service: 04/24/2015 3:00 PM Medical Record Number: [id number] Patient Account Number: [id number] Date of Birth/Sex: 04-Jun-1926 (81 y.o. Male) Treating RN: Baruch Gouty, RN, BSN, Velva Harman Primary Care Physician: Alvester Chou Other Clinician: Referring Physician: Alvester Chou Treating Physician/Extender: Benjaman Pott in Treatment:  2 Electronic Signature(s) Signed: 04/24/2015 3:33:12 PM By: Judene Companion MD Entered By: Judene Companion on 04/24/2015 15:33:12 [name] [name] ([id number]) -------------------------------------------------------------------------------- Physician Orders Details Patient Name: [name] [name] Date of Service: 04/24/2015 3:00 PM Medical Record Number: [id number] Patient Account  Number: [id number] Date of Birth/Sex: June 11, 1926 (81 y.o. 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 o May shower with protection. o No tub bath. Wound #2 Left,Circumferential Lower Leg o Cleanse wound with mild soap and water o May Shower, gently pat wound dry prior to applying new dressing. o May shower with protection. o No tub bath. Wound #3 Left,Plantar Foot o Cleanse wound with mild soap and water o May Shower, gently pat wound dry prior to applying new dressing. o May shower with protection. o No tub bath. Skin Barriers/Peri-Wound  Care Wound #1 Right,Circumferential Lower Leg o Barrier cream Wound #2 Left,Circumferential Lower Leg o Barrier cream [place], Shray ([id number]) Wound #3 Left,Plantar Foot o Barrier cream Primary Wound Dressing Wound #1 Right,Circumferential Lower Leg o Aquacel Ag Wound #2 Left,Circumferential Lower Leg o Aquacel Ag Wound #3 Left,Plantar Foot o Aquacel Ag Secondary Dressing Wound #1 Right,Circumferential Lower Leg o ABD pad Wound #2 Left,Circumferential Lower Leg o ABD pad Wound #3 Left,Plantar Foot o ABD pad Dressing Change Frequency Wound #1 Right,Circumferential Lower Leg o Three times weekly Wound #2 Left,Circumferential Lower Leg o Three times weekly Wound #3 Left,Plantar Foot o Three times weekly Follow-up Appointments Wound #1 Right,Circumferential Lower Leg o Return Appointment in 1 week. Wound #2 Left,Circumferential Lower Leg o Return Appointment in 1 week. Wound #3 Left,Plantar Foot o Return Appointment in 1 week. Edema Control Wound #1 Right,Circumferential Lower Leg o [employer] Bilaterally [place], [place] ([id number]) Wound #2 Left,Circumferential Lower Leg o [employer] Boots Bilaterally Wound #3 Left,Plantar Foot o [employer] Bilaterally Home Health Wound #1 Right,Circumferential Lower Leg o [place] Visits - [place] Nurse may visit PRN to address patientos wound care needs. o FACE TO FACE ENCOUNTER: MEDICARE and MEDICAID PATIENTS: I certify that this patient is under my care and that I had a face-to-face encounter that meets the physician face-to-face encounter requirements with this patient on this date. The encounter with the patient was in whole or in part for the following MEDICAL CONDITION: (primary reason for [place]) MEDICAL NECESSITY: I certify, that based on my findings, NURSING services are a medically necessary home health service. HOME BOUND STATUS: I certify that my  clinical findings support that this patient is homebound (i.e., Due to illness or injury, pt requires aid of supportive devices such as crutches, cane, wheelchairs, walkers, the use of special transportation or the assistance of another person to leave their place of residence. There is a normal inability to leave the home and doing so requires considerable and taxing effort. Other absences are for medical reasons / religious services and are infrequent or of short duration when for other reasons). o  If current dressing causes regression in wound condition, may D/C ordered dressing product/s and apply Normal Saline Moist Dressing daily until next [place] / Other MD appointment. [place] of regression in wound condition at [phone]. o Please direct any NON-WOUND related issues/requests for orders to patient's Primary Care Physician Wound #2 Left,Circumferential Lower Leg o [place] Visits - [place] Nurse may visit PRN to address patientos wound care needs. o FACE TO FACE ENCOUNTER: MEDICARE and MEDICAID PATIENTS: I certify that this patient is under my care and that I had a face-to-face encounter that meets the physician face-to-face encounter requirements with this patient on this date. The encounter with the patient was in whole or in part for the following MEDICAL CONDITION: (primary reason for [place]) MEDICAL NECESSITY: I certify, that based on my findings, NURSING services are a medically necessary home health service. HOME BOUND STATUS: I certify that my clinical findings support that this patient is homebound (i.e., Due to illness or injury, pt requires aid of supportive devices such as crutches, cane, wheelchairs, walkers, the use of special transportation or the assistance of another person to leave their place of residence. There is a normal inability to leave the home and doing so requires considerable  and taxing effort. Other absences are for medical reasons / religious services and are infrequent or of short duration when for other reasons). o If current dressing causes regression in wound condition, may D/C ordered dressing product/s and apply Normal Saline Moist Dressing daily until next [place] / Other MD appointment. [place] of regression in wound condition at [phone]. o Please direct any NON-WOUND related issues/requests for orders to patient's Primary Care Physician GARRITT, MANNY ([id number]) Wound #3 [place] Visits - [place] Nurse may visit PRN to address patientos wound care needs. o FACE TO FACE ENCOUNTER: MEDICARE and MEDICAID PATIENTS: I certify that this patient is under my care and that I had a face-to-face encounter that meets the physician face-to-face encounter requirements with this patient on this date. The encounter with the patient was in whole or in part for the following MEDICAL CONDITION: (primary reason for [place]) MEDICAL NECESSITY: I certify, that based on my findings, NURSING services are a medically necessary home health service. HOME BOUND STATUS: I certify that my clinical findings support that this patient is homebound (i.e., Due to illness or injury, pt requires aid of supportive devices such as crutches, cane, wheelchairs, walkers, the use of special transportation or the assistance of another person to leave their place of residence. There is a normal inability to leave the home and doing so requires considerable and taxing effort. Other absences are for medical reasons / religious services and are infrequent or of short duration when for other reasons). o If current dressing causes regression in wound condition, may D/C ordered dressing product/s and apply Normal Saline Moist Dressing daily until next [place] / Other MD appointment.  [place] of regression in wound condition at [phone]. o Please direct any NON-WOUND related issues/requests for orders to patient's Primary Care Physician Electronic Signature(s) Signed: 04/24/2015 5:12:08 PM By: Regan Lemming BSN, RN Signed: 04/26/2015 8:37:21 AM By: Judene Companion MD Previous Signature: 04/24/2015 5:06:20 PM Version By: Regan Lemming BSN, RN Entered By: Regan Lemming on 04/24/2015 17:12:07 [name] [name] ([id number]) -------------------------------------------------------------------------------- Problem List Details Patient Name: [name] [name] Date of Service: 04/24/2015 3:00 PM  Medical Record Number: [id number] Patient Account Number: [id number] Date of Birth/Sex: 1926/06/04 (81 y.o. Male) Treating RN: Afful, RN, BSN, [place] Primary Care Physician: Alvester Chou Other Clinician: Referring Physician: Alvester Chou Treating Physician/Extender: Benjaman Pott in Treatment: 2 Active Problems ICD-10 Encounter Code Description Active Date Diagnosis E11.622 Type 2 diabetes mellitus with other skin ulcer 04/09/2015 Yes I89.0 Lymphedema, not elsewhere classified 04/09/2015 Yes I70.238 Atherosclerosis of native arteries of right leg with 04/09/2015 Yes ulceration of other part of lower right leg I70.248 Atherosclerosis of native arteries of left leg with ulceration 04/09/2015 Yes of other part of lower left leg I87.331 Chronic venous hypertension (idiopathic) with ulcer and 04/09/2015 Yes inflammation of right lower extremity I87.332 Chronic venous hypertension (idiopathic) with ulcer and 04/09/2015 Yes inflammation of left lower extremity Inactive Problems Resolved Problems Electronic Signature(s) Signed: 04/24/2015 3:32:11 PM By: Judene Companion MD Entered By: Judene Companion on 04/24/2015 15:32:11 [name] [name] ([id number]) -------------------------------------------------------------------------------- Progress Note Details Patient Name: [name] [name] Date of Service: 04/24/2015 3:00 PM Medical Record Number: [id number] Patient Account Number: [id number] Date of Birth/Sex: June 14, 1926 (81 y.o. Male) Treating RN: Baruch Gouty, RN, BSN, Velva Harman Primary Care Physician: Alvester Chou Other Clinician: Referring Physician: Alvester Chou Treating Physician/Extender: Benjaman Pott in Treatment: 2 Subjective Chief Complaint Information obtained from Patient Patient presents to the wound care center for a consult due non healing wound both lower extremities. The patient is a poor historian and was seen here once in July 2016. He says home health was doing his dressings and he was healed but now has recurrence of the problem. Problem has been there at least for 6 months. History of Present Illness (HPI) The following HPI elements were documented for the patient's wound: Location: swelling of the legs right more than left an ulceration both lower extremities Quality: Patient reports experiencing a dull pain to affected area(s). Severity: Patient states wound (s) are getting worse Duration: Patient has had the wound for > 6 months prior to seeking treatment at the wound center Timing: Pain in wound is Intermittent. Context: The wound appeared gradually over time Modifying Factors: he has been having home health come and apply his dressing daily was healed. Associated Signs and Symptoms: Patient reports having difficulty standing for long periods. Nickolai Zerbe is a 81 y.o. male with history of DM, hypertension, chronic kidney disease, gout, diastolic dysfunction and the patient was having increasing swelling with increasing fluid draining from the legs and pain over the last 6 weeks. He says his wounds are completely healed and the home health had stopped coming to do his dressings. Recent hemoglobin A1c was 6.6. During the last admission A venous duplex study revealed no evidence of deep vein thrombosis or superficial thrombosis involving the right  and left lower extremity. Was seen in January 2016 by Dr. Adele Barthel at [facility]: Non-Invasive Vascular Imaging ABI (05/05/2014)  R: 0.65 (0.73), DP: mono, PT: mono, TBI: 0.57  L: 0.65 (0.69), DP: mono, PT: mono, TBI: 0.36 The patient had the diagnosis of bilateral lower extremity moderate peripheral atherosclerotic disease, bilateral lower extremity chronic venous insufficiency. Due to the patient's age and mental status conservative therapy was only discussed and no surgical intervention was planned. The patient has significant dementia and has a caregiver who spends a few hours every day coming to see him. He does not have any family members. [name], [name] ([id number]) Objective Constitutional Vitals Time Taken: 3:14 PM, Height: 70 in, Weight: 230 lbs, BMI: 33, Temperature: 97.9 F, Pulse: 100 bpm, Respiratory  Rate: 19 breaths/min, Blood Pressure: 130/81 mmHg. Integumentary (Hair, Skin) Wound #1 status is Open. Original cause of wound was Gradually Appeared. The wound is located on the Right,Circumferential Lower Leg. The wound measures 10cm length x 6cm width x 0.1cm depth; 47.124cm^2 area and 4.712cm^3 volume. Wound #2 status is Open. Original cause of wound was Gradually Appeared. The wound is located on the Left,Circumferential Lower Leg. The wound measures 9cm length x 8cm width x 0.1cm depth; 56.549cm^2 area and 5.655cm^3 volume. Wound #3 status is Open. Original cause of wound was Gradually Appeared. The wound is located on the [place]. The wound measures 2cm length x 5cm width x 0.1cm depth; 7.854cm^2 area and 0.785cm^3 volume. 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 I70.248 - Atherosclerosis of native arteries of left leg with ulceration of other part of lower left  leg I87.331 - Chronic venous hypertension (idiopathic) with ulcer and inflammation of right lower extremity I87.332 - Chronic venous hypertension (idiopathic) with ulcer and inflammation of left lower extremity Procedures Bilateral venous hypertension with inflammation.[place] Amber alginate and copression NAHIR, DAURIO ([id number]) Electronic Signature(s) Signed: 04/24/2015 3:35:12 PM By: Judene Companion MD Entered By: Judene Companion on 04/24/2015 15:35:11 [name] [name] ([id number]) -------------------------------------------------------------------------------- SuperBill Details Patient Name: [name] [name] Date of Service: 04/24/2015 Medical Record Number: [id number] Patient Account Number: [id number] Date of Birth/Sex: 1927-03-19 (81 y.o. 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 I70.248 Atherosclerosis of native arteries of left leg with ulceration of other part of lower left leg Chronic venous hypertension (idiopathic) with ulcer and inflammation of right lower I87.331 extremity Chronic venous hypertension (idiopathic) with ulcer and inflammation of left lower I87.332 extremity Facility Procedures CPT4 Code: [id number] Description: 29580 - APPLY UNNA BOOT/PROFO BILATERAL Modifier: Quantity: 1 Physician Procedures CPT4: Description Modifier Quantity Code [id number] - WC PHYS LEVEL 2 - EST PT 1 ICD-10 Description Diagnosis I87.331 Chronic venous hypertension (idiopathic) with ulcer and inflammation of right lower extremity I87.332 Chronic venous hypertension  (idiopathic) with ulcer and  inflammation of left lower extremity Electronic Signature(s) Signed: 04/24/2015 4:52:16 PM By: Regan Lemming BSN, RN Signed: 04/26/2015 8:37:21 AM By: Judene Companion MD Previous Signature: 04/24/2015 3:35:53 PM Version By: Judene Companion MD Entered By: Regan Lemming on 04/24/2015 16:52:16

## 2015-04-25 NOTE — Progress Notes (Signed)
COBURN, LENGER ([id number]) Visit Report for 04/24/2015 Arrival Information Details Patient Name: [name] [name], [name] [name] Date of Service: 04/24/2015 3:00 PM Medical Record Number: [id number] Patient Account Number: [id number] Date of Birth/Sex: Jul 13, 1926 (81 y.o. Male) Treating RN: Baruch Gouty, RN, BSN, Velva Harman Primary Care Physician: Alvester Chou Other Clinician: Referring Physician: Alvester Chou Treating Physician/Extender: Benjaman Pott in Treatment: 2 Visit Information History Since Last Visit Any new allergies or adverse reactions: No Patient Arrived: [name] Had a fall or experienced change in No Arrival Time: 15:11 activities of daily living that may affect Accompanied By: caregiver risk of falls: Transfer Assistance: None Signs or symptoms of abuse/neglect since last No Patient Identification Verified: Yes visito Secondary Verification Yes Hospitalized since last visit: No Process Completed: Has Dressing in Place as Prescribed: Yes Patient Has Alerts: Yes Has Compression in Place as Prescribed: Yes Patient Alerts: DMII Pain Present Now: No AIB [place] BILATERAL >220 Electronic Signature(s) Signed: 04/24/2015 5:06:20 PM By: Regan Lemming BSN, RN Entered By: Regan Lemming on 04/24/2015 15:12:09 [name] [name] ([id number]) -------------------------------------------------------------------------------- Encounter Discharge Information Details Patient Name: [name] [name] Date of Service: 04/24/2015 3:00 PM Medical Record Number: [id number] Patient Account Number: [id number] Date of Birth/Sex: Jun 10, 1926 (81 y.o. Male) Treating RN: Baruch Gouty, RN, BSN, Velva Harman Primary Care Physician: Alvester Chou Other Clinician: Referring Physician: Alvester Chou Treating Physician/Extender: Benjaman Pott in Treatment: 2 Encounter Discharge Information Items Discharge Pain Level: 0 Discharge Condition: Stable Ambulatory Status: Walker Discharge Destination: Home Private Transportation: Auto Accompanied By:  caregiver Schedule Follow-up Appointment: No Medication Reconciliation completed and No provided to Patient/Care Jennavie Martinek: Clinical Summary of Care: Electronic Signature(s) Signed: 04/24/2015 3:49:38 PM By: Regan Lemming BSN, RN Previous Signature: 04/24/2015 3:36:38 PM Version By: Judene Companion MD Entered By: Regan Lemming on 04/24/2015 15:49:38 [name] [name] ([id number]) -------------------------------------------------------------------------------- Lower Extremity Assessment Details Patient Name: [name] [name] Date of Service: 04/24/2015 3:00 PM Medical Record Number: [id number] Patient Account Number: [id number] Date of Birth/Sex: 1926-05-17 (81 y.o. Male) Treating RN: Baruch Gouty, RN, BSN, Velva Harman Primary Care Physician: Alvester Chou Other Clinician: Referring Physician: Alvester Chou Treating Physician/Extender: Judene Companion Weeks in Treatment: 2 Edema Assessment Assessed: [Left: No] [Right: No] E[Left: dema] [Right: :] Calf Left: Right: Point of Measurement: 35 cm From Medial Instep 37.8 cm 38.9 cm Ankle Left: Right: Point of Measurement: 11 cm From Medial Instep 25.6 cm 27.6 cm Vascular Assessment Claudication: Claudication Assessment [Left:None] [Right:None] Pulses: Posterior Tibial Dorsalis Pedis Doppler: [Left:Multiphasic] [Right:Multiphasic] Extremity colors, hair growth, and conditions: Extremity Color: [Left:Dusky] [Right:Dusky] Hair Growth on Extremity: [Left:No] [Right:No] Temperature of Extremity: [Left:Warm] [Right:Warm] Capillary Refill: [Left:< 3 seconds] [Right:< 3 seconds] Toe Nail Assessment Left: Right: Thick: Yes Yes Discolored: Yes Yes Deformed: No No Improper Length and Hygiene: Yes Yes Electronic Signature(s) Signed: 04/24/2015 3:46:09 PM By: Regan Lemming BSN, RN Entered By: Regan Lemming on 04/24/2015 15:46:09 HYUN, FRIESE ([id number]) ASIEL, KAMPFER ([id number]) -------------------------------------------------------------------------------- Multi Wound  Chart Details Patient Name: [name] [name] Date of Service: 04/24/2015 3:00 PM Medical Record Number: [id number] Patient Account Number: [id number] Date of Birth/Sex: 1926/08/23 (81 y.o. Male) Treating RN: Baruch Gouty, RN, BSN, Velva Harman Primary Care Physician: Alvester Chou Other Clinician: Referring Physician: Alvester Chou Treating Physician/Extender: Judene Companion Weeks in Treatment: 2 Vital Signs Height(in): 70 Pulse(bpm): 100 Weight(lbs): 230 Blood Pressure 130/81 (mmHg): Body Mass Index(BMI): 33 Temperature(F): 97.9 Respiratory Rate 19 (breaths/min): Photos: [1:No Photos] [2:No Photos] [3:No Photos] Wound Location: [1:Right, Circumferential Lower Leg] [2:Left, Circumferential Lower Leg] [3:Left, Plantar Foot] Wounding Event: [1:Gradually Appeared] [2:Gradually Appeared] [3:Gradually Appeared] Primary Etiology: [1:Lymphedema] [2:Lymphedema] [3:Lymphedema] Date Acquired: [  1:12/28/2014] [2:12/28/2014] [3:01/27/2015] Weeks of Treatment: [1:2] [2:2] [3:2] Wound Status: [1:Open] [2:Open] [3:Open] Clustered Wound: [1:Yes] [2:Yes] [3:No] Measurements L x W x D 10x6x0.1 [2:9x8x0.1] [3:2x5x0.1] (cm) Area (cm) : [1:47.124] [2:56.549] [3:7.854] Volume (cm) : [1:4.712] [2:5.655] [3:0.785] % Reduction in Area: [1:87.90%] [2:78.60%] [3:-4.20%] % Reduction in Volume: 87.90% [2:78.60%] [3:-4.10%] Classification: [1:Partial Thickness] [2:Partial Thickness] [3:Partial Thickness] Periwound Skin Texture: No Abnormalities Noted [2:No Abnormalities Noted] [3:No Abnormalities Noted] Periwound Skin [1:No Abnormalities Noted] [2:No Abnormalities Noted] [3:No Abnormalities Noted] Moisture: Periwound Skin Color: No Abnormalities Noted [2:No Abnormalities Noted] [3:No Abnormalities Noted] Tenderness on [1:No] [2:No] [3:No] Treatment Notes Electronic Signature(s) Signed: 04/24/2015 5:06:20 PM By: Regan Lemming BSN, RN Entered By: Regan Lemming on 04/24/2015 15:40:24 [name] [name] ([id number]) [name] [name]  ([id number]) -------------------------------------------------------------------------------- [place] Details Patient Name: [name] [name] Date of Service: 04/24/2015 3:00 PM Medical Record Number: [id number] Patient Account Number: [id number] Date of Birth/Sex: 04-20-1927 (81 y.o. Male) Treating RN: Baruch Gouty, RN, BSN, Velva Harman Primary Care Physician: Alvester Chou Other Clinician: Referring Physician: Alvester Chou Treating Physician/Extender: Benjaman Pott in Treatment: 2 Active Inactive Abuse / Safety / Falls / Self Care Management Nursing Diagnoses: Impaired physical mobility Potential for falls Goals: Patient will remain injury free Date Initiated: 04/09/2015 Goal Status: Active Interventions: Assess fall risk on admission and as needed Notes: Orientation to the Wound Care Program Nursing Diagnoses: Knowledge deficit related to the wound healing center program Goals: Patient/caregiver will verbalize understanding of the [facility] Program Date Initiated: 04/09/2015 Goal Status: Active Interventions: Provide education on orientation to the wound center Notes: Venous Leg Ulcer Nursing Diagnoses: Actual venous Insuffiency (use after diagnosis is confirmed) Goals: Patient will maintain optimal edema control KEYVAN, DEVEAUX ([id number]) Date Initiated: 04/09/2015 Goal Status: Active Interventions: Assess peripheral edema status every visit. Provide education on venous insufficiency Notes: Wound/Skin Impairment Nursing Diagnoses: Impaired tissue integrity Goals: Patient/caregiver will verbalize understanding of skin care regimen Date Initiated: 04/09/2015 Goal Status: Active Ulcer/skin breakdown will have a volume reduction of 30% by week 4 Date Initiated: 04/09/2015 Goal Status: Active Ulcer/skin breakdown will have a volume reduction of 50% by week 8 Date Initiated: 04/09/2015 Goal Status: Active Ulcer/skin breakdown will have a volume  reduction of 80% by week 12 Date Initiated: 04/09/2015 Goal Status: Active Ulcer/skin breakdown will heal within 14 weeks Date Initiated: 04/09/2015 Goal Status: Active Interventions: Assess ulceration(s) every visit Notes: Electronic Signature(s) Signed: 04/24/2015 5:06:20 PM By: Regan Lemming BSN, RN Entered By: Regan Lemming on 04/24/2015 15:40:14 [name] [name] ([id number]) -------------------------------------------------------------------------------- Pain Assessment Details Patient Name: [name] [name] Date of Service: 04/24/2015 3:00 PM Medical Record Number: [id number] Patient Account Number: [id number] Date of Birth/Sex: 04/27/1927 (81 y.o. Male) Treating RN: Baruch Gouty, RN, BSN, [place] Primary Care Physician: Alvester Chou Other Clinician: Referring Physician: Alvester Chou Treating Physician/Extender: Benjaman Pott in Treatment: 2 Active Problems Location of Pain Severity and Description of Pain Patient Has Paino No Site Locations Pain Management and Medication Current Pain Management: Electronic Signature(s) Signed: 04/24/2015 5:06:20 PM By: Regan Lemming BSN, RN Entered By: Regan Lemming on 04/24/2015 15:12:15 [name] [name] ([id number]) -------------------------------------------------------------------------------- Patient/Caregiver Education Details Patient Name: [name] [name] Date of Service: 04/24/2015 3:00 PM Medical Record Number: [id number] Patient Account Number: [id number] Date of Birth/Gender: Sep 04, 1926 (81 y.o. Male) Treating RN: Baruch Gouty, RN, BSN, Velva Harman Primary Care Physician: Alvester Chou Other Clinician: Referring Physician: Alvester Chou Treating Physician/Extender: Benjaman Pott in Treatment: 2 Education Assessment Education Provided To: Patient Education Topics Provided Venous: Methods: Explain/Verbal Welcome To [facility]: Methods:  Explain/Verbal Responses: State content correctly Electronic Signature(s) Signed: 04/24/2015 3:50:04 PM By: Regan Lemming BSN, RN Previous Signature: 04/24/2015 3:36:47 PM Version By: Judene Companion MD Entered By: Regan Lemming on 04/24/2015 15:50:04 [name] [name] ([id number]) -------------------------------------------------------------------------------- Wound Assessment Details Patient Name: [name] [name] Date of Service: 04/24/2015 3:00 PM Medical Record Number: [id number] Patient Account Number: [id number] Date of Birth/Sex: 11-Jan-1927 (81 y.o. Male) Treating RN: Baruch Gouty, RN, BSN, [place] Primary Care Physician: Alvester Chou Other Clinician: Referring Physician: Alvester Chou Treating Physician/Extender: Benjaman Pott in Treatment: 2 Wound Status Wound Number: 1 Primary Etiology: Lymphedema Wound Location: Right, Circumferential Lower Wound Status: Open Leg Wounding Event: Gradually Appeared Date Acquired: 12/28/2014 Weeks Of Treatment: 2 Clustered Wound: Yes Photos Photo Uploaded By: Regan Lemming on 04/24/2015 16:38:40 Wound Measurements Length: (cm) 10 Width: (cm) 6 Depth: (cm) 0.1 Area: (cm) 47.124 Volume: (cm) 4.712 % Reduction in Area: 87.9% % Reduction in Volume: 87.9% Wound Description Classification: Partial Thickness Periwound Skin Texture Texture Color No Abnormalities Noted: No No Abnormalities Noted: No Moisture No Abnormalities Noted: No Treatment Notes Wound #1 (Right, Circumferential Lower Leg) Vaux, Viet ([id number]) 1. Cleansed with: Cleanse wound with antibacterial soap and water 4. Dressing Applied: Aquacel Ag 5. Secondary Dressing Applied ABD Pad 7. Secured with Water engineer) Signed: 04/24/2015 5:06:20 PM By: Regan Lemming BSN, RN Entered By: Regan Lemming on 04/24/2015 15:21:56 [name] [name] ([id number]) -------------------------------------------------------------------------------- Wound Assessment Details Patient Name: [name] [name] Date of Service: 04/24/2015 3:00 PM Medical Record Number: [id number] Patient Account  Number: [id number] Date of Birth/Sex: 1926-10-19 (81 y.o. Male) Treating RN: Baruch Gouty, RN, BSN, [place] Primary Care Physician: Alvester Chou Other Clinician: Referring Physician: Alvester Chou Treating Physician/Extender: Benjaman Pott in Treatment: 2 Wound Status Wound Number: 2 Primary Etiology: Lymphedema Wound Location: Left, Circumferential Lower Leg Wound Status: Open Wounding Event: Gradually Appeared Date Acquired: 12/28/2014 Weeks Of Treatment: 2 Clustered Wound: Yes Photos Photo Uploaded By: Regan Lemming on 04/24/2015 16:38:40 Wound Measurements Length: (cm) 9 Width: (cm) 8 Depth: (cm) 0.1 Area: (cm) 56.549 Volume: (cm) 5.655 % Reduction in Area: 78.6% % Reduction in Volume: 78.6% Wound Description Classification: Partial Thickness Periwound Skin Texture Texture Color No Abnormalities Noted: No No Abnormalities Noted: No Kustra, Ether ([id number]) Moisture No Abnormalities Noted: No Treatment Notes Wound #2 (Left, Circumferential Lower Leg) 1. Cleansed with: Cleanse wound with antibacterial soap and water 4. Dressing Applied: Aquacel Ag 5. Secondary Dressing Applied ABD Pad 7. Secured with Water engineer) Signed: 04/24/2015 5:06:20 PM By: Regan Lemming BSN, RN Entered By: Regan Lemming on 04/24/2015 15:21:56 [name] [name] ([id number]) -------------------------------------------------------------------------------- Wound Assessment Details Patient Name: [name] [name] Date of Service: 04/24/2015 3:00 PM Medical Record Number: [id number] Patient Account Number: [id number] Date of Birth/Sex: 1926-12-09 (81 y.o. Male) Treating RN: Baruch Gouty, RN, BSN, Velva Harman Primary Care Physician: Alvester Chou Other Clinician: Referring Physician: Alvester Chou Treating Physician/Extender: Benjaman Pott in Treatment: 2 Wound Status Wound Number: 3 Primary Etiology: Lymphedema Wound Location: Left, Plantar Foot Wound Status: Open Wounding Event:  Gradually Appeared Date Acquired: 01/27/2015 Weeks Of Treatment: 2 Clustered Wound: No Photos Photo Uploaded By: Regan Lemming on 04/24/2015 16:38:41 Wound Measurements Length: (cm) 2 Width: (cm) 5 Depth: (cm) 0.1 Area: (cm) 7.854 Volume: (cm) 0.785 % Reduction in Area: -4.2% % Reduction in Volume: -4.1% Wound Description Classification: Partial Thickness Periwound Skin Texture Texture Color No Abnormalities Noted: No No Abnormalities Noted: No Moisture No Abnormalities Noted: No Treatment Notes Wound #3 (Left, Plantar Foot) 1. Cleansed with: Serrao, Farren (  [id number]) Cleanse wound with antibacterial soap and water 4. Dressing Applied: Aquacel Ag 5. Secondary Dressing Applied ABD Pad 7. Secured with Water engineer) Signed: 04/24/2015 5:06:20 PM By: Regan Lemming BSN, RN Entered By: Regan Lemming on 04/24/2015 15:21:56 [name] [name] ([id number]) -------------------------------------------------------------------------------- Vitals Details Patient Name: [name] [name] Date of Service: 04/24/2015 3:00 PM Medical Record Number: [id number] Patient Account Number: [id number] Date of Birth/Sex: 1927/03/26 (81 y.o. Male) Treating RN: Baruch Gouty, RN, BSN, [place] Primary Care Physician: Alvester Chou Other Clinician: Referring Physician: Alvester Chou Treating Physician/Extender: Benjaman Pott in Treatment: 2 Vital Signs Time Taken: 15:14 Temperature (F): 97.9 Height (in): 70 Pulse (bpm): 100 Weight (lbs): 230 Respiratory Rate (breaths/min): 19 Body Mass Index (BMI): 33 Blood Pressure (mmHg): 130/81 Reference Range: 80 - 120 mg / dl Electronic Signature(s) Signed: 04/24/2015 5:06:20 PM By: Regan Lemming BSN, RN Entered By: Regan Lemming on 04/24/2015 15:14:37

## 2015-05-01 ENCOUNTER — Encounter: Payer: Medicare Other | Attending: Surgery | Admitting: Surgery

## 2015-05-01 DIAGNOSIS — I10 Essential (primary) hypertension: Secondary | ICD-10-CM | POA: Diagnosis not present

## 2015-05-01 DIAGNOSIS — N189 Chronic kidney disease, unspecified: Secondary | ICD-10-CM | POA: Insufficient documentation

## 2015-05-01 DIAGNOSIS — E11622 Type 2 diabetes mellitus with other skin ulcer: Secondary | ICD-10-CM | POA: Diagnosis present

## 2015-05-01 DIAGNOSIS — M109 Gout, unspecified: Secondary | ICD-10-CM | POA: Insufficient documentation

## 2015-05-01 DIAGNOSIS — I890 Lymphedema, not elsewhere classified: Secondary | ICD-10-CM | POA: Diagnosis not present

## 2015-05-01 DIAGNOSIS — I87332 Chronic venous hypertension (idiopathic) with ulcer and inflammation of left lower extremity: Secondary | ICD-10-CM | POA: Diagnosis not present

## 2015-05-01 DIAGNOSIS — I87331 Chronic venous hypertension (idiopathic) with ulcer and inflammation of right lower extremity: Secondary | ICD-10-CM | POA: Insufficient documentation

## 2015-05-01 DIAGNOSIS — E1122 Type 2 diabetes mellitus with diabetic chronic kidney disease: Secondary | ICD-10-CM | POA: Diagnosis not present

## 2015-05-01 DIAGNOSIS — I70238 Atherosclerosis of native arteries of right leg with ulceration of other part of lower right leg: Secondary | ICD-10-CM | POA: Insufficient documentation

## 2015-05-01 DIAGNOSIS — I70248 Atherosclerosis of native arteries of left leg with ulceration of other part of lower left leg: Secondary | ICD-10-CM | POA: Diagnosis not present

## 2015-05-02 NOTE — Progress Notes (Signed)
JAHAAN, PAVLOCK ([id number]) Visit Report for 05/01/2015 Chief Complaint Document Details Patient Name: [name], [name] Date of Service: 05/01/2015 2:30 PM Medical Record Number: [id number] Patient Account Number: [id number] Date of Birth/Sex: 12/26/1926 (82 y.o. Male) Treating RN: Macarthur Critchley Primary Care Physician: Alvester Chou Other Clinician: Referring Physician: Alvester Chou Treating Physician/Extender: Frann Rider in Treatment: 3 Information Obtained from: Patient Chief Complaint Patient presents to the wound care center for a consult due non healing wound both lower extremities. The patient is a poor historian and was seen here once in July 2016. He says home health was doing his dressings and he was healed but now has recurrence of the problem. Problem has been there at least for 6 months. Electronic Signature(s) Signed: 05/01/2015 3:02:37 PM By: Christin Fudge MD, FACS Entered By: Christin Fudge on 05/01/2015 15:02:36 [name] [name] ([id number]) -------------------------------------------------------------------------------- HPI Details Patient Name: [name] [name] Date of Service: 05/01/2015 2:30 PM Medical Record Number: [id number] Patient Account Number: [id number] Date of Birth/Sex: [birth year] (82 y.o. Male) Treating RN: Macarthur Critchley Primary Care Physician: Alvester Chou Other Clinician: Referring Physician: Alvester Chou Treating Physician/Extender: Frann Rider in Treatment: 3 History of Present Illness Location: swelling of the legs right more than left an ulceration both lower extremities Quality: Patient reports experiencing a dull pain to affected area(s). Severity: Patient states wound (s) are getting worse Duration: Patient has had the wound for > 6 months prior to seeking treatment at the wound center Timing: Pain in wound is Intermittent. Context: The wound appeared gradually over time Modifying Factors: he has been having home health come and apply his dressing  daily was healed. Associated Signs and Symptoms: Patient reports having difficulty standing for long periods. HPI Description: [name] [name] is a 82 y.o. male with history of DM, hypertension, chronic kidney disease, gout, diastolic dysfunction and the patient was having increasing swelling with increasing fluid draining from the legs and pain over the last 6 weeks. He says his wounds are completely healed and the home health had stopped coming to do his dressings. Recent hemoglobin A1c was 6.6. During the last admission A venous duplex study revealed no evidence of deep vein thrombosis or superficial thrombosis involving the right and left lower extremity. Was seen in January 2016 by Dr. Adele Barthel at [facility]: Non-Invasive Vascular Imaging ABI (05/05/2014) o R: 0.65 (0.73), DP: mono, PT: mono, TBI: 0.57 o L: 0.65 (0.69), DP: mono, PT: mono, TBI: 0.36 The patient had the diagnosis of bilateral lower extremity moderate peripheral atherosclerotic disease, bilateral lower extremity chronic venous insufficiency. Due to the patient's age and mental status conservative therapy was only discussed and no surgical intervention was planned. The patient has significant dementia and has a caregiver who spends a few hours every day coming to see him. He does not have any family members. Electronic Signature(s) Signed: 05/01/2015 3:04:01 PM By: Christin Fudge MD, FACS Entered By: Christin Fudge on 05/01/2015 15:04:00 [name] [name] ([id number]) -------------------------------------------------------------------------------- Physical Exam Details Patient Name: [name] [name] Date of Service: 05/01/2015 2:30 PM Medical Record Number: [id number] Patient Account Number: [id number] Date of Birth/Sex: [birth year] (82 y.o. Male) Treating RN: Macarthur Critchley Primary Care Physician: Alvester Chou Other Clinician: Referring Physician: Alvester Chou Treating Physician/Extender: Frann Rider in Treatment: 3 Constitutional .  Pulse regular. Respirations normal and unlabored. Afebrile. . Eyes Nonicteric. Reactive to light. Ears, Nose, Mouth, and Throat Lips, teeth, and gums WNL.Marland Kitchen Moist mucosa without lesions. Neck supple and nontender. No palpable supraclavicular or cervical adenopathy. Normal sized without goiter. Respiratory  WNL. No retractions.. Cardiovascular Pedal Pulses WNL. No clubbing, cyanosis or edema. Lymphatic No adneopathy. No adenopathy. No adenopathy. Musculoskeletal Adexa without tenderness or enlargement.. Digits and nails w/o clubbing, cyanosis, infection, petechiae, ischemia, or inflammatory conditions.. Integumentary (Hair, Skin) No suspicious lesions. No crepitus or fluctuance. No peri-wound warmth or erythema. No masses.Marland Kitchen Psychiatric Judgement and insight Intact.. No evidence of depression, anxiety, or agitation.. Notes overall he is making an excellent recovery from his bilateral lower extremity edema and the beeping is much less. The ulceration at the plantar aspect of his left foot is also much better and there is minimal fluid draining. Electronic Signature(s) Signed: 05/01/2015 3:05:16 PM By: Christin Fudge MD, FACS Entered By: Christin Fudge on 05/01/2015 15:05:14 [name] [name] ([id number]) -------------------------------------------------------------------------------- Physician Orders Details Patient Name: [name] [name] Date of Service: 05/01/2015 2:30 PM Medical Record Number: [id number] Patient Account Number: [id number] Date of Birth/Sex: 26-Sep-1926 (82 y.o. Male) Treating RN: Cornell Barman Primary Care Physician: Alvester Chou Other Clinician: Referring Physician: Alvester Chou Treating Physician/Extender: Frann Rider in Treatment: 3 Verbal / Phone Orders: Yes Clinician: Cornell Barman Read Back and Verified: Yes Diagnosis Coding Wound Cleansing Wound #1 Right,Circumferential Lower Leg o Cleanse wound with mild soap and water o May Shower, gently pat wound dry prior to  applying new dressing. o May shower with protection. o No tub bath. Wound #2 Left,Circumferential Lower Leg o Cleanse wound with mild soap and water o May Shower, gently pat wound dry prior to applying new dressing. o May shower with protection. o No tub bath. Primary Wound Dressing Wound #1 Right,Circumferential Lower Leg o Aquacel Ag Wound #2 Left,Circumferential Lower Leg o Aquacel Ag Wound #3 Left,Plantar Foot o Aquacel Ag Secondary Dressing Wound #1 Right,Circumferential Lower Leg o ABD pad Wound #2 Left,Circumferential Lower Leg o ABD pad Wound #3 Left,Plantar Foot o ABD pad Dressing Change Frequency Wound #1 Right,Circumferential Lower Leg o Dressing is to be changed Monday and Thursday. DORAL, WENHOLD ([id number]) Wound #2 Left,Circumferential Lower Leg o Dressing is to be changed Monday and Thursday. Wound #3 Left,Plantar Foot o Dressing is to be changed Monday and Thursday. Follow-up Appointments Wound #1 Right,Circumferential Lower Leg o Return Appointment in 1 week. Wound #2 Left,Circumferential Lower Leg o Return Appointment in 1 week. Wound #3 Left,Plantar Foot o Return Appointment in 1 week. Edema Control Wound #1 Right,Circumferential Lower Leg o Unna Boots Bilaterally Wound #2 Left,Circumferential Lower Leg o Unna Boots Bilaterally Wound #3 Left,Plantar Foot o Retail buyer Bilaterally Home Health Wound #1 Right,Circumferential Lower Leg o [place] Visits - [place] Nurse may visit PRN to address patientos wound care needs. o FACE TO FACE ENCOUNTER: MEDICARE and MEDICAID PATIENTS: I certify that this patient is under my care and that I had a face-to-face encounter that meets the physician face-to-face encounter requirements with this patient on this date. The encounter with the patient was in whole or in part for the following MEDICAL CONDITION: (primary reason for [place]) MEDICAL NECESSITY: I certify, that based on my findings, NURSING services are a medically necessary home health service. HOME BOUND STATUS: I certify that my clinical findings support that this patient is homebound (i.e., Due to illness or injury, pt requires aid of supportive devices such as crutches, cane, wheelchairs, walkers, the use of special transportation or the assistance of another person to leave their place of residence. There is a normal inability to leave the home and doing so requires considerable and taxing effort. Other absences are  for medical reasons / religious services and are infrequent or of short duration when for other reasons). o If current dressing causes regression in wound condition, may D/C ordered dressing product/s and apply Normal Saline Moist Dressing daily until next [place] / Other MD appointment. [place] of regression in wound condition at [phone]. o Please direct any NON-WOUND related issues/requests for orders to patient's Primary Care Physician ZEPHAN, DROBNICK ([id number]) Wound #2 Left,Circumferential Lower Leg o [place] Visits - [place] Nurse may visit PRN to address patientos wound care needs. o FACE TO FACE ENCOUNTER: MEDICARE and MEDICAID PATIENTS: I certify that this patient is under my care and that I had a face-to-face encounter that meets the physician face-to-face encounter requirements with this patient on this date. The encounter with the patient was in whole or in part for the following MEDICAL CONDITION: (primary reason for [place]) MEDICAL NECESSITY: I certify, that based on my findings, NURSING services are a medically necessary home health service. HOME BOUND STATUS: I certify that my clinical findings support that this patient is homebound (i.e., Due to illness or injury, pt requires aid of supportive devices such as crutches, cane,  wheelchairs, walkers, the use of special transportation or the assistance of another person to leave their place of residence. There is a normal inability to leave the home and doing so requires considerable and taxing effort. Other absences are for medical reasons / religious services and are infrequent or of short duration when for other reasons). o If current dressing causes regression in wound condition, may D/C ordered dressing product/s and apply Normal Saline Moist Dressing daily until next [place] / Other MD appointment. [place] of regression in wound condition at [phone]. o Please direct any NON-WOUND related issues/requests for orders to patient's Primary Care Physician Wound #3 [place] Visits - [place] Nurse may visit PRN to address patientos wound care needs. o FACE TO FACE ENCOUNTER: MEDICARE and MEDICAID PATIENTS: I certify that this patient is under my care and that I had a face-to-face encounter that meets the physician face-to-face encounter requirements with this patient on this date. The encounter with the patient was in whole or in part for the following MEDICAL CONDITION: (primary reason for [place]) MEDICAL NECESSITY: I certify, that based on my findings, NURSING services are a medically necessary home health service. HOME BOUND STATUS: I certify that my clinical findings support that this patient is homebound (i.e., Due to illness or injury, pt requires aid of supportive devices such as crutches, cane, wheelchairs, walkers, the use of special transportation or the assistance of another person to leave their place of residence. There is a normal inability to leave the home and doing so requires considerable and taxing effort. Other absences are for medical reasons / religious services and are infrequent or of short duration when for other reasons). o If current  dressing causes regression in wound condition, may D/C ordered dressing product/s and apply Normal Saline Moist Dressing daily until next [place] / Other MD appointment. [place] of regression in wound condition at [phone]. o Please direct any NON-WOUND related issues/requests for orders to patient's Primary Care Physician Electronic Signature(s) Signed: 05/01/2015 4:44:22 PM By: Christin Fudge MD, FACS Signed: 05/01/2015 5:35:09 PM By: Gretta Cool RN, BSN, Kim RN, BSN Entered By: Gretta Cool, RN, BSN, Kim on 05/01/2015 15:00:07 [name] [name] ([id number]) [name] [name] (  [id number]) -------------------------------------------------------------------------------- Problem List Details Patient Name: FITZPATRICK, VIVIAN Date of Service: 05/01/2015 2:30 PM Medical Record Number: [id number] Patient Account Number: [id number] Date of Birth/Sex: [birth year] (82 y.o. Male) Treating RN: Macarthur Critchley Primary Care Physician: Alvester Chou Other Clinician: Referring Physician: Alvester Chou Treating Physician/Extender: Frann Rider in Treatment: 3 Active Problems ICD-10 Encounter Code Description Active Date Diagnosis E11.622 Type 2 diabetes mellitus with other skin ulcer 04/09/2015 Yes I89.0 Lymphedema, not elsewhere classified 04/09/2015 Yes I70.238 Atherosclerosis of native arteries of right leg with 04/09/2015 Yes ulceration of other part of lower right leg I70.248 Atherosclerosis of native arteries of left leg with ulceration 04/09/2015 Yes of other part of lower left leg I87.331 Chronic venous hypertension (idiopathic) with ulcer and 04/09/2015 Yes inflammation of right lower extremity I87.332 Chronic venous hypertension (idiopathic) with ulcer and 04/09/2015 Yes inflammation of left lower extremity Inactive Problems Resolved Problems Electronic Signature(s) Signed: 05/01/2015 3:01:43 PM By: Christin Fudge MD, FACS Entered By: Christin Fudge on 05/01/2015 15:01:41 [name] [name] ([id number]) -------------------------------------------------------------------------------- Progress Note Details Patient Name: [name] [name] Date of Service: 05/01/2015 2:30 PM Medical Record Number: [id number] Patient Account Number: [id number] Date of Birth/Sex: Jul 31, 1926 (82 y.o. Male) Treating RN: Macarthur Critchley Primary Care Physician: Alvester Chou Other Clinician: Referring Physician: Alvester Chou Treating Physician/Extender: Frann Rider in Treatment: 3 Subjective Chief Complaint Information obtained from Patient Patient presents to the wound care center for a consult due non healing wound both lower extremities. The patient is a poor historian and was seen here once in July 2016. He says home health was doing his dressings and he was healed but now has recurrence of the problem. Problem has been there at least for 6 months. History of Present Illness (HPI) The following HPI elements were documented for the patient's wound: Location: swelling of the legs right more than left an ulceration both lower extremities Quality: Patient reports experiencing a dull pain to affected area(s). Severity: Patient states wound (s) are getting worse Duration: Patient has had the wound for > 6 months prior to seeking treatment at the wound center Timing: Pain in wound is Intermittent. Context: The wound appeared gradually over time Modifying Factors: he has been having home health come and apply his dressing daily was healed. Associated Signs and Symptoms: Patient reports having difficulty standing for long periods. Joh Droge is a 82 y.o. male with history of DM, hypertension, chronic kidney disease, gout, diastolic dysfunction and the patient was having increasing swelling with increasing fluid draining from the legs and pain over the last 6 weeks. He says his wounds are completely healed and the home health had stopped coming to do his dressings. Recent hemoglobin A1c was  6.6. During the last admission A venous duplex study revealed no evidence of deep vein thrombosis or superficial thrombosis involving the right and left lower extremity. Was seen in January 2016 by Dr. Adele Barthel at [facility]: Non-Invasive Vascular Imaging ABI (05/05/2014)  R: 0.65 (0.73), DP: mono, PT: mono, TBI: 0.57  L: 0.65 (0.69), DP: mono, PT: mono, TBI: 0.36 The patient had the diagnosis of bilateral lower extremity moderate peripheral atherosclerotic disease, bilateral lower extremity chronic venous insufficiency. Due to the patient's age and mental status conservative therapy was only discussed and no surgical intervention was planned. The patient has significant dementia and has a caregiver who spends a few hours every day coming to see him. He does not have any family members. TUYEN, LACONTE ([id number]) Objective Constitutional Pulse regular. Respirations normal and unlabored. Afebrile. Vitals  Time Taken: 2:35 PM, Height: 70 in, Weight: 230 lbs, BMI: 33, Temperature: 98 F, Pulse: 90 bpm, Respiratory Rate: 20 breaths/min, Blood Pressure: 143/80 mmHg. Eyes Nonicteric. Reactive to light. Ears, Nose, Mouth, and Throat Lips, teeth, and gums WNL.Marland Kitchen Moist mucosa without lesions. Neck supple and nontender. No palpable supraclavicular or cervical adenopathy. Normal sized without goiter. Respiratory WNL. No retractions.. Cardiovascular Pedal Pulses WNL. No clubbing, cyanosis or edema. Lymphatic No adneopathy. No adenopathy. No adenopathy. Musculoskeletal Adexa without tenderness or enlargement.. Digits and nails w/o clubbing, cyanosis, infection, petechiae, ischemia, or inflammatory conditions.Marland Kitchen Psychiatric Judgement and insight Intact.. No evidence of depression, anxiety, or agitation.. General Notes: overall he is making an excellent recovery from his bilateral lower extremity edema and the beeping is much less. The ulceration at the plantar aspect of his left foot is also much  better and there is minimal fluid draining. Integumentary (Hair, Skin) No suspicious lesions. No crepitus or fluctuance. No peri-wound warmth or erythema. No masses.. Wound #1 status is Open. Original cause of wound was Gradually Appeared. The wound is located on the Right,Circumferential Lower Leg. The wound measures 12.6cm length x 3.5cm width x 0.1cm depth; 34.636cm^2 area and 3.464cm^3 volume. The wound is limited to skin breakdown. There is no tunneling or undermining noted. There is a medium amount of serosanguineous drainage noted. The wound margin is indistinct and nonvisible. There is medium (34-66%) pink, pale granulation within the wound bed. There is a [place], Delon ([id number]) small (1-33%) amount of necrotic tissue within the wound bed including Adherent Slough. The periwound skin appearance exhibited: Maceration, Moist. The periwound skin appearance did not exhibit: Callus, Crepitus, Excoriation, Fluctuance, Friable, Induration, Localized Edema, Rash, Scarring, Dry/Scaly, Atrophie Blanche, Cyanosis, Ecchymosis, Hemosiderin Staining, Mottled, Pallor, Rubor, Erythema. Periwound temperature was noted as No Abnormality. Wound #2 status is Open. Original cause of wound was Gradually Appeared. The wound is located on the Left,Circumferential Lower Leg. The wound measures 10.7cm length x 8cm width x 0.1cm depth; 67.23cm^2 area and 6.723cm^3 volume. The wound is limited to skin breakdown. There is no tunneling or undermining noted. There is a medium amount of serosanguineous drainage noted. The wound margin is indistinct and nonvisible. There is medium (34-66%) pink granulation within the wound bed. There is no necrotic tissue within the wound bed. The periwound skin appearance exhibited: Localized Edema, Maceration, Moist. The periwound skin appearance did not exhibit: Callus, Crepitus, Excoriation, Fluctuance, Friable, Induration, Rash, Scarring, Dry/Scaly, Atrophie Blanche, Cyanosis,  Ecchymosis, Hemosiderin Staining, Mottled, Pallor, Rubor, Erythema. Periwound temperature was noted as No Abnormality. Wound #3 status is Open. Original cause of wound was Gradually Appeared. The wound is located on the [place]. The wound measures 3.5cm length x 1.5cm width x 0.1cm depth; 4.123cm^2 area and 0.412cm^3 volume. The wound is limited to skin breakdown. There is no tunneling or undermining noted. The wound margin is indistinct and nonvisible. There is small (1-33%) pink granulation within the wound bed. There is a medium (34-66%) amount of necrotic tissue within the wound bed including Adherent Slough. The periwound skin appearance exhibited: Maceration, Moist. The periwound skin appearance did not exhibit: Callus, Crepitus, Excoriation, Fluctuance, Friable, Induration, Localized Edema, Rash, Scarring, Dry/Scaly, Atrophie Blanche, Cyanosis, Ecchymosis, Hemosiderin Staining, Mottled, Pallor, Rubor, Erythema. Periwound temperature was noted as No Abnormality. 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 I70.248 -  Atherosclerosis of native arteries of left leg with ulceration of other part of lower left leg I87.331 - Chronic venous hypertension (idiopathic) with ulcer and inflammation of right lower extremity I87.332 - Chronic venous hypertension (idiopathic) with ulcer and inflammation of left lower extremity Plan Wound Cleansing: Furth, [name] ([id number]) Wound #1 Right,Circumferential Lower Leg: Cleanse wound with mild soap and water May Shower, gently pat wound dry prior to applying new dressing. May shower with protection. No tub bath. Wound #2 Left,Circumferential Lower Leg: Cleanse wound with mild soap and water May Shower, gently pat wound dry prior to applying new  dressing. May shower with protection. No tub bath. Primary Wound Dressing: Wound #1 Right,Circumferential Lower Leg: Aquacel Ag Wound #2 Left,Circumferential Lower Leg: Aquacel Ag Wound #3 Left,Plantar Foot: Aquacel Ag Secondary Dressing: Wound #1 Right,Circumferential Lower Leg: ABD pad Wound #2 Left,Circumferential Lower Leg: ABD pad Wound #3 Left,Plantar Foot: ABD pad Dressing Change Frequency: Wound #1 Right,Circumferential Lower Leg: Dressing is to be changed Monday and Thursday. Wound #2 Left,Circumferential Lower Leg: Dressing is to be changed Monday and Thursday. Wound #3 Left,Plantar Foot: Dressing is to be changed Monday and Thursday. Follow-up Appointments: Wound #1 Right,Circumferential Lower Leg: Return Appointment in 1 week. Wound #2 Left,Circumferential Lower Leg: Return Appointment in 1 week. Wound #3 Left,Plantar Foot: Return Appointment in 1 week. Edema Control: Wound #1 Right,Circumferential Lower Leg: Unna Boots Bilaterally Wound #2 Left,Circumferential Lower Leg: Retail buyer Bilaterally Wound #3 Left,Plantar Foot: Retail buyer Bilaterally Home Health: Wound #1 Right,Circumferential Lower Leg: Continue Home Health Visits - [place] Nurse may visit PRN to address patient s wound care needs. FACE TO FACE ENCOUNTER: MEDICARE and MEDICAID PATIENTS: I certify that this patient is under my care and that I had a face-to-face encounter that meets the physician face-to-face encounter MELIK, ODONALD ([id number]) requirements with this patient on this date. The encounter with the patient was in whole or in part for the following MEDICAL CONDITION: (primary reason for [place]) MEDICAL NECESSITY: I certify, that based on my findings, NURSING services are a medically necessary home health service. HOME BOUND STATUS: I certify that my clinical findings support that this patient is homebound (i.e., Due to illness or injury, pt requires aid of  supportive devices such as crutches, cane, wheelchairs, walkers, the use of special transportation or the assistance of another person to leave their place of residence. There is a normal inability to leave the home and doing so requires considerable and taxing effort. Other absences are for medical reasons / religious services and are infrequent or of short duration when for other reasons). If current dressing causes regression in wound condition, may D/C ordered dressing product/s and apply Normal Saline Moist Dressing daily until next [place] / Other MD appointment. [place] of regression in wound condition at [phone]. Please direct any NON-WOUND related issues/requests for orders to patient's Primary Care Physician Wound #2 Left,Circumferential Lower Leg: [place] Visits - [place] Nurse may visit PRN to address patient s wound care needs. FACE TO FACE ENCOUNTER: MEDICARE and MEDICAID PATIENTS: I certify that this patient is under my care and that I had a face-to-face encounter that meets the physician face-to-face encounter requirements with this patient on this date. The encounter with the patient was in whole or in part for the following MEDICAL CONDITION: (primary reason for [place]) MEDICAL NECESSITY: I certify, that based on my findings, NURSING services are a medically necessary home health service. HOME  BOUND STATUS: I certify that my clinical findings support that this patient is homebound (i.e., Due to illness or injury, pt requires aid of supportive devices such as crutches, cane, wheelchairs, walkers, the use of special transportation or the assistance of another person to leave their place of residence. There is a normal inability to leave the home and doing so requires considerable and taxing effort. Other absences are for medical reasons / religious services and are infrequent or of short duration when for  other reasons). If current dressing causes regression in wound condition, may D/C ordered dressing product/s and apply Normal Saline Moist Dressing daily until next [place] / Other MD appointment. [place] of regression in wound condition at [phone]. Please direct any NON-WOUND related issues/requests for orders to patient's Primary Care Physician Wound #3 Left,Plantar Foot: [place] Visits - [place] Nurse may visit PRN to address patient s wound care needs. FACE TO FACE ENCOUNTER: MEDICARE and MEDICAID PATIENTS: I certify that this patient is under my care and that I had a face-to-face encounter that meets the physician face-to-face encounter requirements with this patient on this date. The encounter with the patient was in whole or in part for the following MEDICAL CONDITION: (primary reason for [place]) MEDICAL NECESSITY: I certify, that based on my findings, NURSING services are a medically necessary home health service. HOME BOUND STATUS: I certify that my clinical findings support that this patient is homebound (i.e., Due to illness or injury, pt requires aid of supportive devices such as crutches, cane, wheelchairs, walkers, the use of special transportation or the assistance of another person to leave their place of residence. There is a normal inability to leave the home and doing so requires considerable and taxing effort. Other absences are for medical reasons / religious services and are infrequent or of short duration when for other reasons). If current dressing causes regression in wound condition, may D/C ordered dressing product/s and apply Normal Saline Moist Dressing daily until next [place] / Other MD appointment. [place] of regression in wound condition at [phone]. Please direct any NON-WOUND related issues/requests for orders to patient's Primary Care Physician DSEAN, DORAME ([id number]) We will recommend silver alginate and Unna's boots bilaterally. He has a caregiver with him today and gave explained to her that it's imperative that this dressing gets changed regularly by home health and they should do it twice a week. He was seen by Dr. Adele Barthel at [facility] and we have referred him back for an opinion. He is also urged to come to see as an regular basis at weekly intervals. Electronic Signature(s) Signed: 05/01/2015 3:06:34 PM By: Christin Fudge MD, FACS Entered By: Christin Fudge on 05/01/2015 15:06:33 [name] [name] ([id number]) -------------------------------------------------------------------------------- SuperBill Details Patient Name: [name] [name] Date of Service: 05/01/2015 Medical Record Number: [id number] Patient Account Number: [id number] Date of Birth/Sex: 1926/10/09 (82 y.o. 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 left leg Chronic venous hypertension (idiopathic) with ulcer and inflammation of right lower I87.331 extremity Chronic venous hypertension (idiopathic) with  ulcer and inflammation of left lower I87.332 extremity Facility Procedures CPT4 Code: [id number] Description: 29580 - APPLY UNNA BOOT/PROFO BILATERAL Modifier: Quantity: 1 Physician Procedures CPT4: Description Modifier Quantity Code [id number] - WC PHYS LEVEL 3 - EST PT 1 ICD-10 Description Diagnosis E11.622 Type 2 diabetes mellitus with other skin ulcer I89.0 Lymphedema, not  elsewhere classified I87.331 Chronic venous hypertension  (idiopathic) with ulcer and inflammation of right lower extremity 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

## 2015-05-02 NOTE — Progress Notes (Signed)
ZENO, MATERNA ([id number]) Visit Report for 05/01/2015 Arrival Information Details Patient Name: [name] [name], [name] [name] Date of Service: 05/01/2015 2:30 PM Medical Record Number: [id number] Patient Account Number: [id number] Date of Birth/Sex: 1927/02/10 (82 y.o. Male) Treating RN: Macarthur Critchley Primary Care Physician: Alvester Chou Other Clinician: Referring Physician: Alvester Chou Treating Physician/Extender: Frann Rider in Treatment: 3 Visit Information History Since Last Visit Added or deleted any medications: No Patient Arrived: [place] Any new allergies or adverse reactions: No Arrival Time: 14:34 Had a fall or experienced change in No Accompanied By: caregiver activities of daily living that may affect Transfer Assistance: None risk of falls: Patient Identification Verified: Yes Signs or symptoms of abuse/neglect since last No Secondary Verification Yes visito Process Completed: Hospitalized since last visit: No Patient Has Alerts: Yes Has Dressing in Place as Prescribed: Yes Patient Alerts: DMII Has Compression in Place as Prescribed: Yes AIB [place] BILATERAL Pain Present Now: No >220 Electronic Signature(s) Signed: 05/01/2015 4:12:15 PM By: Rebecca Eaton, RN, Sendra Entered By: Rebecca Eaton RN, Sendra on 05/01/2015 14:35:01 [name] [name] ([id number]) -------------------------------------------------------------------------------- Encounter Discharge Information Details Patient Name: [name] [name] Date of Service: 05/01/2015 2:30 PM Medical Record Number: [id number] Patient Account Number: [id number] Date of Birth/Sex: 06-02-1926 (82 y.o. Male) Treating RN: Macarthur Critchley Primary Care Physician: Alvester Chou Other Clinician: Referring Physician: Alvester Chou Treating Physician/Extender: Frann Rider in Treatment: 3 Encounter Discharge Information Items Discharge Pain Level: 0 Discharge Condition: Stable Ambulatory Status: [place] Discharge  Destination: Home Private Transportation: Auto Accompanied By: caregiver Schedule Follow-up Appointment: Yes Medication Reconciliation completed and Yes provided to Patient/Care Marcelino Campos: Clinical Summary of Care: Electronic Signature(s) Signed: 05/01/2015 4:12:15 PM By: Rebecca Eaton, RN, Sendra Entered By: Rebecca Eaton, RN, Sendra on 05/01/2015 14:59:24 [name] [name] ([id number]) -------------------------------------------------------------------------------- Lower Extremity Assessment Details Patient Name: [name] [name] Date of Service: 05/01/2015 2:30 PM Medical Record Number: [id number] Patient Account Number: [id number] Date of Birth/Sex: [birth year] (82 y.o. Male) Treating RN: Macarthur Critchley Primary Care Physician: Alvester Chou Other Clinician: Referring Physician: Alvester Chou Treating Physician/Extender: Frann Rider in Treatment: 3 Edema Assessment Assessed: [Left: No] [Right: No] E[Left: dema] [Right: :] Calf Left: Right: Point of Measurement: 35 cm From Medial Instep 38.5 cm 38.4 cm Ankle Left: Right: Point of Measurement: 11 cm From Medial Instep 26 cm 27 cm Vascular Assessment Pulses: Posterior Tibial Dorsalis Pedis Palpable: [Left:No] [Right:No] Doppler: [Left:Monophasic] [Right:Monophasic] Extremity colors, hair growth, and conditions: Extremity Color: [Left:Hyperpigmented] [Right:Hyperpigmented] Hair Growth on Extremity: [Left:No] [Right:No] Temperature of Extremity: [Left:Warm] [Right:Warm] Capillary Refill: [Left:< 3 seconds] [Right:< 3 seconds] Toe Nail Assessment Left: Right: Thick: Yes Yes Discolored: Yes Yes Deformed: Yes Yes Improper Length and Hygiene: Yes Yes Electronic Signature(s) Signed: 05/01/2015 4:12:15 PM By: Rebecca Eaton, RN, Sendra Entered By: Rebecca Eaton RN, Sendra on 05/01/2015 14:37:15 Oehlert, [name] ([id number]) MINNIE, CRISANTO ([id number]) -------------------------------------------------------------------------------- Multi Wound Chart  Details Patient Name: [name] [name] Date of Service: 05/01/2015 2:30 PM Medical Record Number: [id number] Patient Account Number: [id number] Date of Birth/Sex: 02-01-1927 (82 y.o. Male) Treating RN: Cornell Barman Primary Care Physician: Alvester Chou Other Clinician: Referring Physician: Alvester Chou Treating Physician/Extender: Frann Rider in Treatment: 3 Vital Signs Height(in): 70 Pulse(bpm): 90 Weight(lbs): 230 Blood Pressure 143/80 (mmHg): Body Mass Index(BMI): 33 Temperature(F): 98 Respiratory Rate 20 (breaths/min): Photos: [1:No Photos] [2:No Photos] [3:No Photos] Wound Location: [1:Right Lower Leg - Circumfernential] [2:Left Lower Leg - Circumfernential] [3:Left Foot - Plantar] Wounding Event: [1:Gradually Appeared] [2:Gradually Appeared] [3:Gradually Appeared] Primary Etiology: [1:Lymphedema] [2:Lymphedema] [3:Lymphedema] Comorbid History: [1:Congestive Heart Failure, Hypertension, Type II Diabetes, Neuropathy] [2:Congestive Heart  Failure, Hypertension, Type II Diabetes, Neuropathy] [3:Congestive Heart Failure, Hypertension, Type II Diabetes, Neuropathy] Date Acquired: [1:12/28/2014] [2:12/28/2014] [3:01/27/2015] Weeks of Treatment: [1:3] [2:3] [3:3] Wound Status: [1:Open] [2:Open] [3:Open] Clustered Wound: [1:Yes] [2:Yes] [3:No] Measurements L x W x D 12.6x3.5x0.1 [2:10.7x8x0.1] [3:3.5x1.5x0.1] (cm) Area (cm) : [1:34.636] [2:67.23] [3:4.123] Volume (cm) : [1:3.464] [2:6.723] [3:0.412] % Reduction in Area: [1:91.10%] [2:74.50%] [3:45.30%] % Reduction in Volume: 91.10% [2:74.50%] [3:45.40%] Classification: [1:Partial Thickness] [2:Partial Thickness] [3:Partial Thickness] HBO Classification: [1:Grade 0] [2:Grade 1] [3:Grade 0] Exudate Amount: [1:Medium] [2:Medium] [3:N/A] Exudate Type: [1:Serosanguineous] [2:Serosanguineous] [3:N/A] Exudate Color: [1:red, brown] [2:red, brown] [3:N/A] Foul Odor After [1:No] [2:Yes] [3:No] Cleansing: Odor Anticipated Due to N/A [2:No]  [3:N/A] Product Use: Wound Margin: [1:Indistinct, nonvisible] [2:Indistinct, nonvisible] [3:Indistinct, nonvisible] Granulation Amount: [1:Medium (34-66%)] [2:Medium (34-66%)] [3:Small (1-33%)] Granulation Quality: [1:Pink, Pale] [2:Pink] [3:Pink] Necrotic Amount: Small (1-33%) None Present (0%) Medium (34-66%) Exposed Structures: Fascia: No Fascia: No Fascia: No Fat: No Fat: No Fat: No Tendon: No Tendon: No Tendon: No Muscle: No Muscle: No Muscle: No Joint: No Joint: No Joint: No Bone: No Bone: No Bone: No Limited to Skin Limited to Skin Limited to Skin Breakdown Breakdown Breakdown Epithelialization: None None Small (1-33%) Periwound Skin Texture: Edema: No Edema: Yes Edema: No Excoriation: No Excoriation: No Excoriation: No Induration: No Induration: No Induration: No Callus: No Callus: No Callus: No Crepitus: No Crepitus: No Crepitus: No Fluctuance: No Fluctuance: No Fluctuance: No Friable: No Friable: No Friable: No Rash: No Rash: No Rash: No Scarring: No Scarring: No Scarring: No Periwound Skin Maceration: Yes Maceration: Yes Maceration: Yes Moisture: Moist: Yes Moist: Yes Moist: Yes Dry/Scaly: No Dry/Scaly: No Dry/Scaly: No Periwound Skin Color: Atrophie Blanche: No Atrophie Blanche: No Atrophie Blanche: No Cyanosis: No Cyanosis: No Cyanosis: No Ecchymosis: No Ecchymosis: No Ecchymosis: No Erythema: No Erythema: No Erythema: No Hemosiderin Staining: No Hemosiderin Staining: No Hemosiderin Staining: No Mottled: No Mottled: No Mottled: No Pallor: No Pallor: No Pallor: No Rubor: No Rubor: No Rubor: No Temperature: No Abnormality No Abnormality No Abnormality Tenderness on No No No Palpation: Wound Preparation: Ulcer Cleansing: Other: Ulcer Cleansing: Other: Ulcer Cleansing: Other: soap AND WATER soap and water water and soap Topical Anesthetic Topical Anesthetic Topical Anesthetic Applied: Other: lidocaine Applied: Other:  lidocaine Applied: Other 4% 4% Treatment Notes Electronic Signature(s) Signed: 05/01/2015 5:35:09 PM By: Gretta Cool, RN, BSN, Kim RN, BSN Entered By: Gretta Cool, RN, BSN, Kim on 05/01/2015 14:45:43 [name] [name] ([id number]) -------------------------------------------------------------------------------- [place] Details Patient Name: [name] [name] Date of Service: 05/01/2015 2:30 PM Medical Record Number: [id number] Patient Account Number: [id number] Date of Birth/Sex: 03-01-1927 (82 y.o. Male) Treating RN: Cornell Barman Primary Care Physician: Alvester Chou Other Clinician: Referring Physician: Alvester Chou Treating Physician/Extender: Frann Rider in Treatment: 3 Active Inactive Abuse / Safety / Falls / Self Care Management Nursing Diagnoses: Impaired physical mobility Potential for falls Goals: Patient will remain injury free Date Initiated: 04/09/2015 Goal Status: Active Interventions: Assess fall risk on admission and as needed Notes: Orientation to the Wound Care Program Nursing Diagnoses: Knowledge deficit related to the wound healing center program Goals: Patient/caregiver will verbalize understanding of the [facility] Program Date Initiated: 04/09/2015 Goal Status: Active Interventions: Provide education on orientation to the wound center Notes: Venous Leg Ulcer Nursing Diagnoses: Actual venous Insuffiency (use after diagnosis is confirmed) Goals: Patient will maintain optimal edema control HAIRO, DOOLY ([id number]) Date Initiated: 04/09/2015 Goal Status: Active Interventions: Assess peripheral edema status every visit. Provide education on venous insufficiency Notes: Wound/Skin Impairment  Nursing Diagnoses: Impaired tissue integrity Goals: Patient/caregiver will verbalize understanding of skin care regimen Date Initiated: 04/09/2015 Goal Status: Active Ulcer/skin breakdown will have a volume reduction of 30% by week 4 Date Initiated:  04/09/2015 Goal Status: Active Ulcer/skin breakdown will have a volume reduction of 50% by week 8 Date Initiated: 04/09/2015 Goal Status: Active Ulcer/skin breakdown will have a volume reduction of 80% by week 12 Date Initiated: 04/09/2015 Goal Status: Active Ulcer/skin breakdown will heal within 14 weeks Date Initiated: 04/09/2015 Goal Status: Active Interventions: Assess ulceration(s) every visit Notes: Electronic Signature(s) Signed: 05/01/2015 5:35:09 PM By: Gretta Cool, RN, BSN, Kim RN, BSN Entered By: Gretta Cool, RN, BSN, Kim on 05/01/2015 14:45:22 Purdon, [name] ([id number]) -------------------------------------------------------------------------------- Pain Assessment Details Patient Name: [name] [name] Date of Service: 05/01/2015 2:30 PM Medical Record Number: [id number] Patient Account Number: [id number] Date of Birth/Sex: 05-Nov-1926 (82 y.o. Male) Treating RN: Macarthur Critchley Primary Care Physician: Alvester Chou Other Clinician: Referring Physician: Alvester Chou Treating Physician/Extender: Frann Rider in Treatment: 3 Active Problems Location of Pain Severity and Description of Pain Patient Has Paino No Site Locations Pain Management and Medication Current Pain Management: Electronic Signature(s) Signed: 05/01/2015 4:12:15 PM By: Rebecca Eaton, RN, Sendra Entered By: Rebecca Eaton, RN, Sendra on 05/01/2015 14:35:08 [name] [name] ([id number]) -------------------------------------------------------------------------------- Patient/Caregiver Education Details Patient Name: [name] [name] Date of Service: 05/01/2015 2:30 PM Medical Record Number: [id number] Patient Account Number: [id number] Date of Birth/Gender: Jul 14, 1926 (82 y.o. Male) Treating RN: Macarthur Critchley Primary Care Physician: Alvester Chou Other Clinician: Referring Physician: Alvester Chou Treating Physician/Extender: Frann Rider in Treatment: 3 Education Assessment Education Provided To: Patient Education Topics  Provided Venous: Handouts: Controlling Swelling with Compression Stockings , Other: unna boot Methods: Explain/Verbal Responses: State content correctly Wound/Skin Impairment: Handouts: Caring for Your Ulcer, Skin Care Do's and Dont's Methods: Explain/Verbal Responses: State content correctly Electronic Signature(s) Signed: 05/01/2015 4:12:15 PM By: Rebecca Eaton, RN, Sendra Entered By: Rebecca Eaton, RN, Sendra on 05/01/2015 15:00:37 [name] [name] ([id number]) -------------------------------------------------------------------------------- Wound Assessment Details Patient Name: [name] [name] Date of Service: 05/01/2015 2:30 PM Medical Record Number: [id number] Patient Account Number: [id number] Date of Birth/Sex: [birth year] (82 y.o. Male) Treating RN: Macarthur Critchley Primary Care Physician: Alvester Chou Other Clinician: Referring Physician: Alvester Chou Treating Physician/Extender: Frann Rider in Treatment: 3 Wound Status Wound Number: 1 Primary Lymphedema Etiology: Wound Location: Right Lower Leg - Circumfernential Wound Open Status: Wounding Event: Gradually Appeared Comorbid Congestive Heart Failure, Date Acquired: 12/28/2014 History: Hypertension, Type II Diabetes, Weeks Of Treatment: 3 Neuropathy Clustered Wound: Yes Photos Photo Uploaded By: Gretta Cool, RN, BSN, Kim on 05/01/2015 16:41:39 Wound Measurements Length: (cm) 12.6 Width: (cm) 3.5 Depth: (cm) 0.1 Area: (cm) 34.636 Volume: (cm) 3.464 % Reduction in Area: 91.1% % Reduction in Volume: 91.1% Epithelialization: None Tunneling: No Undermining: No Wound Description Classification: Partial Thickness Diabetic Severity Earleen Newport): Grade 0 Wound Margin: Indistinct, nonvisibl Exudate Amount: Medium Exudate Type: Serosanguineous Exudate Color: red, brown Foul Odor After Cleansing: No e Wound Bed Granulation Amount: Medium (34-66%) Exposed Structure Granulation Quality: Pink, Pale Fascia Exposed: No Sylvain, Shahab  ([id number]) Necrotic Amount: Small (1-33%) Fat Layer Exposed: No Necrotic Quality: Adherent Slough Tendon Exposed: No Muscle Exposed: No Joint Exposed: No Bone Exposed: No Limited to Skin Breakdown Periwound Skin Texture Texture Color No Abnormalities Noted: No No Abnormalities Noted: No Callus: No Atrophie Blanche: No Crepitus: No Cyanosis: No Excoriation: No Ecchymosis: No Fluctuance: No Erythema: No Friable: No Hemosiderin Staining: No Induration: No Mottled: No Localized Edema: No Pallor: No Rash: No Rubor: No Scarring: No Temperature /  Pain Moisture Temperature: No Abnormality No Abnormalities Noted: No Dry / Scaly: No Maceration: Yes Moist: Yes Wound Preparation Ulcer Cleansing: Other: soap AND WATER, Topical Anesthetic Applied: Other: lidocaine 4%, Treatment Notes Wound #1 (Right, Circumferential Lower Leg) 1. Cleansed with: Cleanse wound with antibacterial soap and water 4. Dressing Applied: Aquacel Ag 5. Secondary Dressing Applied Dry Gauze 7. Secured with Water engineer) Signed: 05/01/2015 4:12:15 PM By: Rebecca Eaton, RN, Sendra Entered By: Rebecca Eaton, RN, Sendra on 05/01/2015 14:39:12 [name] [name] ([id number]) -------------------------------------------------------------------------------- Wound Assessment Details Patient Name: [name] [name] Date of Service: 05/01/2015 2:30 PM Medical Record Number: [id number] Patient Account Number: [id number] Date of Birth/Sex: May 30, 1926 (82 y.o. Male) Treating RN: Macarthur Critchley Primary Care Physician: Alvester Chou Other Clinician: Referring Physician: Alvester Chou Treating Physician/Extender: Frann Rider in Treatment: 3 Wound Status Wound Number: 2 Primary Lymphedema Etiology: Wound Location: Left Lower Leg - Circumfernential Wound Open Status: Wounding Event: Gradually Appeared Comorbid Congestive Heart Failure, Date Acquired: 12/28/2014 History: Hypertension, Type II  Diabetes, Weeks Of Treatment: 3 Neuropathy Clustered Wound: Yes Photos Photo Uploaded By: Gretta Cool, RN, BSN, Kim on 05/01/2015 16:41:40 Wound Measurements Length: (cm) 10.7 Width: (cm) 8 Depth: (cm) 0.1 Area: (cm) 67.23 Volume: (cm) 6.723 % Reduction in Area: 74.5% % Reduction in Volume: 74.5% Epithelialization: None Tunneling: No Undermining: No Wound Description Classification: Partial Thickness Diabetic Severity (Wagner): Grade 1 Wound Margin: Indistinct, nonvisibl Exudate Amount: Medium Exudate Type: Serosanguineous Exudate Color: red, brown Foul Odor After Cleansing: Yes Due to Product Use: No e Wound Bed Granulation Amount: Medium (34-66%) Exposed Structure Granulation Quality: Pink Fascia Exposed: No Rennaker, Helaman ([id number]) Necrotic Amount: None Present (0%) Fat Layer Exposed: No Tendon Exposed: No Muscle Exposed: No Joint Exposed: No Bone Exposed: No Limited to Skin Breakdown Periwound Skin Texture Texture Color No Abnormalities Noted: No No Abnormalities Noted: No Callus: No Atrophie Blanche: No Crepitus: No Cyanosis: No Excoriation: No Ecchymosis: No Fluctuance: No Erythema: No Friable: No Hemosiderin Staining: No Induration: No Mottled: No Localized Edema: Yes Pallor: No Rash: No Rubor: No Scarring: No Temperature / Pain Moisture Temperature: No Abnormality No Abnormalities Noted: No Dry / Scaly: No Maceration: Yes Moist: Yes Wound Preparation Ulcer Cleansing: Other: soap and water, Topical Anesthetic Applied: Other: lidocaine 4%, Treatment Notes Wound #2 (Left, Circumferential Lower Leg) 1. Cleansed with: Cleanse wound with antibacterial soap and water 4. Dressing Applied: Aquacel Ag 5. Secondary Dressing Applied Dry Gauze 7. Secured with Water engineer) Signed: 05/01/2015 4:12:15 PM By: Rebecca Eaton, RN, Sendra Entered By: Rebecca Eaton RN, Sendra on 05/01/2015 14:39:49 [name] [name]  ([id number]) -------------------------------------------------------------------------------- Wound Assessment Details Patient Name: [name] [name] Date of Service: 05/01/2015 2:30 PM Medical Record Number: [id number] Patient Account Number: [id number] Date of Birth/Sex: 11/15/1926 (82 y.o. Male) Treating RN: Macarthur Critchley Primary Care Physician: Alvester Chou Other Clinician: Referring Physician: Alvester Chou Treating Physician/Extender: Frann Rider in Treatment: 3 Wound Status Wound Number: 3 Primary Lymphedema Etiology: Wound Location: Left Foot - Plantar Wound Open Wounding Event: Gradually Appeared Status: Date Acquired: 01/27/2015 Comorbid Congestive Heart Failure, Weeks Of Treatment: 3 History: Hypertension, Type II Diabetes, Clustered Wound: No Neuropathy Photos Photo Uploaded By: Gretta Cool, RN, BSN, Kim on 05/01/2015 16:41:40 Wound Measurements Length: (cm) 3.5 Width: (cm) 1.5 Depth: (cm) 0.1 Area: (cm) 4.123 Volume: (cm) 0.412 % Reduction in Area: 45.3% % Reduction in Volume: 45.4% Epithelialization: Small (1-33%) Tunneling: No Undermining: No Wound Description Classification: Partial Thickness Diabetic Severity Earleen Newport): Grade 0 Wound Margin: Indistinct, nonvisibl Foul Odor After Cleansing: No  e Wound Bed Granulation Amount: Small (1-33%) Exposed Structure Granulation Quality: Pink Fascia Exposed: No Necrotic Amount: Medium (34-66%) Fat Layer Exposed: No Necrotic Quality: Adherent Slough Tendon Exposed: No Muscle Exposed: No Joint Exposed: No Titzer, Kori ([id number]) Bone Exposed: No Limited to Skin Breakdown Periwound Skin Texture Texture Color No Abnormalities Noted: No No Abnormalities Noted: No Callus: No Atrophie Blanche: No Crepitus: No Cyanosis: No Excoriation: No Ecchymosis: No Fluctuance: No Erythema: No Friable: No Hemosiderin Staining: No Induration: No Mottled: No Localized Edema: No Pallor: No Rash: No Rubor:  No Scarring: No Temperature / Pain Moisture Temperature: No Abnormality No Abnormalities Noted: No Dry / Scaly: No Maceration: Yes Moist: Yes Wound Preparation Ulcer Cleansing: Other: water and soap, Topical Anesthetic Applied: Other Treatment Notes Wound #3 (Left, Plantar Foot) 1. Cleansed with: Cleanse wound with antibacterial soap and water 4. Dressing Applied: Aquacel Ag 5. Secondary Dressing Applied Dry Gauze 7. Secured with Water engineer) Signed: 05/01/2015 4:12:15 PM By: Rebecca Eaton, RN, Sendra Entered By: Rebecca Eaton RN, Sendra on 05/01/2015 14:40:29 [name] [name] ([id number]) -------------------------------------------------------------------------------- Vitals Details Patient Name: [name] [name] Date of Service: 05/01/2015 2:30 PM Medical Record Number: [id number] Patient Account Number: [id number] Date of Birth/Sex: 1926/09/24 (82 y.o. Male) Treating RN: Macarthur Critchley Primary Care Physician: Alvester Chou Other Clinician: Referring Physician: Alvester Chou Treating Physician/Extender: Frann Rider in Treatment: 3 Vital Signs Time Taken: 14:35 Temperature (F): 98 Height (in): 70 Pulse (bpm): 90 Weight (lbs): 230 Respiratory Rate (breaths/min): 20 Body Mass Index (BMI): 33 Blood Pressure (mmHg): 143/80 Reference Range: 80 - 120 mg / dl Electronic Signature(s) Signed: 05/01/2015 4:12:15 PM By: Rebecca Eaton, RN, Sendra Entered By: Rebecca Eaton RN, Sendra on 05/01/2015 14:35:36

## 2015-05-04 ENCOUNTER — Other Ambulatory Visit: Payer: Self-pay | Admitting: *Deleted

## 2015-05-04 DIAGNOSIS — M79609 Pain in unspecified limb: Secondary | ICD-10-CM

## 2015-05-04 DIAGNOSIS — M7989 Other specified soft tissue disorders: Secondary | ICD-10-CM

## 2015-05-07 ENCOUNTER — Ambulatory Visit: Payer: Medicare Other | Admitting: Surgery

## 2015-05-10 ENCOUNTER — Encounter: Payer: Medicare Other | Admitting: Surgery

## 2015-05-10 DIAGNOSIS — E11622 Type 2 diabetes mellitus with other skin ulcer: Secondary | ICD-10-CM | POA: Diagnosis not present

## 2015-05-11 NOTE — Progress Notes (Signed)
[name] [name], [name] [name] ([id number]) Visit Report for 05/10/2015 Arrival Information Details Patient Name: [name] [name], [name] [name] Date of Service: 05/10/2015 3:00 PM Medical Record Number: [id number] Patient Account Number: [id number] Date of Birth/Sex: 01/28/1927 (82 y.o. Male) Treating RN: Montey Hora Primary Care Physician: Alvester Chou Other Clinician: Referring Physician: Alvester Chou Treating Physician/Extender: Frann Rider in Treatment: 4 Visit Information History Since Last Visit Added or deleted any medications: No Patient Arrived: Walker Any new allergies or adverse reactions: No Arrival Time: 15:08 Had a fall or experienced change in No Accompanied By: staff activities of daily living that may affect Transfer Assistance: None risk of falls: Patient Identification Verified: Yes Signs or symptoms of abuse/neglect since last No Secondary Verification Process Yes visito Completed: Hospitalized since last visit: No Patient Has Alerts: Yes Pain Present Now: No Patient Alerts: DMII AIB [employer] BILATERAL >220 Electronic Signature(s) Signed: 05/10/2015 5:05:29 PM By: Montey Hora Entered By: Montey Hora on 05/10/2015 15:08:43 [name] [name] ([id number]) -------------------------------------------------------------------------------- Encounter Discharge Information Details Patient Name: [name] [name] Date of Service: 05/10/2015 3:00 PM Medical Record Number: [id number] Patient Account Number: [id number] Date of Birth/Sex: January 12, 1927 (82 y.o. Male) Treating RN: Montey Hora Primary Care Physician: Alvester Chou Other Clinician: Referring Physician: Alvester Chou Treating Physician/Extender: Frann Rider in Treatment: 4 Encounter Discharge Information Items Discharge Pain Level: 0 Discharge Condition: Stable Ambulatory Status: Walker Discharge Destination: Home Transportation: Private Auto Accompanied By: staff Schedule Follow-up Appointment: Yes Medication Reconciliation  completed No and provided to Patient/Care Jahnya Trindade: Provided on Clinical Summary of Care: 05/10/2015 Form Type Recipient Paper Patient KL Electronic Signature(s) Signed: 05/10/2015 4:24:18 PM By: Montey Hora Previous Signature: 05/10/2015 4:03:03 PM Version By: Ruthine Dose Entered By: Montey Hora on 05/10/2015 16:24:18 [name] [name] ([id number]) -------------------------------------------------------------------------------- Lower Extremity Assessment Details Patient Name: [name] [name] Date of Service: 05/10/2015 3:00 PM Medical Record Number: [id number] Patient Account Number: [id number] Date of Birth/Sex: Mar 13, 1927 (82 y.o. Male) Treating RN: Montey Hora Primary Care Physician: Alvester Chou Other Clinician: Referring Physician: Alvester Chou Treating Physician/Extender: Frann Rider in Treatment: 4 Edema Assessment Assessed: [Left: No] [Right: No] E[Left: dema] [Right: :] Calf Left: Right: Point of Measurement: 35 cm From Medial Instep 37.5 cm 37 cm Ankle Left: Right: Point of Measurement: 11 cm From Medial Instep 26.5 cm 28 cm Vascular Assessment Pulses: Posterior Tibial Doppler: [Left:Multiphasic] [Right:Inaudible] Dorsalis Pedis Doppler: [Left:Monophasic] [Right:Inaudible] Extremity colors, hair growth, and conditions: Temperature of Extremity: [Left:Warm] [Right:Warm] Capillary Refill: [Left:> 3 seconds] [Right:> 3 seconds] Toe Nail Assessment Left: Right: Thick: Yes Yes Discolored: No No Deformed: No No Improper Length and Hygiene: No No Electronic Signature(s) Signed: 05/10/2015 5:05:29 PM By: Montey Hora Entered By: Montey Hora on 05/10/2015 15:24:31 [name] [name] ([id number]) -------------------------------------------------------------------------------- Multi Wound Chart Details Patient Name: [name] [name] Date of Service: 05/10/2015 3:00 PM Medical Record Number: [id number] Patient Account Number: [id number] Date of Birth/Sex: 24-Jan-1927 (82  y.o. Male) Treating RN: Baruch Gouty, RN, BSN, Velva Harman Primary Care Physician: Alvester Chou Other Clinician: Referring Physician: Alvester Chou Treating Physician/Extender: Frann Rider in Treatment: 4 Vital Signs Height(in): 70 Pulse(bpm): 68 Weight(lbs): 230 Blood Pressure 128/62 (mmHg): Body Mass Index(BMI): 33 Temperature(F): 98.3 Respiratory Rate 20 (breaths/min): Photos: [1:No Photos] [2:No Photos] [3:No Photos] Wound Location: [1:Right Lower Leg - Circumfernential] [2:Left Lower Leg - Circumfernential] [3:Left Foot - Plantar] Wounding Event: [1:Gradually Appeared] [2:Gradually Appeared] [3:Gradually Appeared] Primary Etiology: [1:Lymphedema] [2:Lymphedema] [3:Lymphedema] Comorbid History: [1:Congestive Heart Failure, Hypertension, Type II Diabetes, Neuropathy] [2:Congestive Heart Failure, Hypertension, Type II Diabetes, Neuropathy] [3:Congestive Heart Failure, Hypertension, Type II Diabetes, Neuropathy]  Date Acquired: [1:12/28/2014] [2:12/28/2014] [3:01/27/2015] Weeks of Treatment: [1:4] [2:4] [3:4] Wound Status: [1:Open] [2:Open] [3:Open] Clustered Wound: [1:Yes] [2:Yes] [3:No] Measurements L x W x D 5.7x4.7x0.1 [2:11.2x9.6x0.1] [3:1.8x2.5x0.1] (cm) Area (cm) : [1:21.041] [2:84.446] [3:3.534] Volume (cm) : [1:2.104] [2:8.445] [3:0.353] % Reduction in Area: [1:94.60%] [2:68.00%] [3:53.10%] % Reduction in Volume: 94.60% [2:68.00%] [3:53.20%] Classification: [1:Partial Thickness] [2:Partial Thickness] [3:Partial Thickness] HBO Classification: [1:Grade 0] [2:Grade 1] [3:Grade 0] Exudate Amount: [1:Medium] [2:Medium] [3:Small] Exudate Type: [1:Serosanguineous] [2:Serosanguineous] [3:Serous] Exudate Color: [1:red, brown] [2:red, brown] [3:amber] Foul Odor After [1:No] [2:Yes] [3:No] Cleansing: Odor Anticipated Due to N/A [2:No] [3:N/A] Product Use: Wound Margin: [1:Indistinct, nonvisible] [2:Indistinct, nonvisible] [3:Indistinct, nonvisible] Granulation Amount: [1:Large  (67-100%)] [2:Large (67-100%)] [3:Large (67-100%)] Granulation Quality: [1:Pink, Pale] [3:Pink] Red, Pink, Hyper- granulation Necrotic Amount: Small (1-33%) None Present (0%) Small (1-33%) Exposed Structures: Fascia: No Fascia: No Fascia: No Fat: No Fat: No Fat: No Tendon: No Tendon: No Tendon: No Muscle: No Muscle: No Muscle: No Joint: No Joint: No Joint: No Bone: No Bone: No Bone: No Limited to Skin Limited to Skin Limited to Skin Breakdown Breakdown Breakdown Epithelialization: None None Small (1-33%) Periwound Skin Texture: Edema: No Edema: Yes Edema: No Excoriation: No Excoriation: No Excoriation: No Induration: No Induration: No Induration: No Callus: No Callus: No Callus: No Crepitus: No Crepitus: No Crepitus: No Fluctuance: No Fluctuance: No Fluctuance: No Friable: No Friable: No Friable: No Rash: No Rash: No Rash: No Scarring: No Scarring: No Scarring: No Periwound Skin Maceration: Yes Maceration: Yes Maceration: Yes Moisture: Moist: Yes Moist: Yes Moist: Yes Dry/Scaly: No Dry/Scaly: No Dry/Scaly: No Periwound Skin Color: Atrophie Blanche: No Atrophie Blanche: No Atrophie Blanche: No Cyanosis: No Cyanosis: No Cyanosis: No Ecchymosis: No Ecchymosis: No Ecchymosis: No Erythema: No Erythema: No Erythema: No Hemosiderin Staining: No Hemosiderin Staining: No Hemosiderin Staining: No Mottled: No Mottled: No Mottled: No Pallor: No Pallor: No Pallor: No Rubor: No Rubor: No Rubor: No Temperature: No Abnormality No Abnormality No Abnormality Tenderness on No No No Palpation: Wound Preparation: Ulcer Cleansing: Other: Ulcer Cleansing: Other: Ulcer Cleansing: Other: soap AND WATER soap and water water and soap Topical Anesthetic Topical Anesthetic Applied: Other: lidocaine Applied: Other: lidocaine 4% 4% Wound Number: 4 N/A N/A Photos: No Photos N/A N/A Wound Location: Left Foot - Dorsal N/A N/A Wounding Event: Gradually  Appeared N/A N/A Primary Etiology: Lymphedema N/A N/A Comorbid History: Congestive Heart Failure, N/A N/A Hypertension, Type II Diabetes, Neuropathy Haskell, [name] ([id number]) Date Acquired: 05/03/2015 N/A N/A Weeks of Treatment: 0 N/A N/A Wound Status: Open N/A N/A Clustered Wound: No N/A N/A Measurements L x W x D 1.5x3x0.1 N/A N/A (cm) Area (cm) : 3.534 N/A N/A Volume (cm) : 0.353 N/A N/A % Reduction in Area: N/A N/A N/A % Reduction in Volume: N/A N/A N/A Classification: Partial Thickness N/A N/A HBO Classification: Grade 0 N/A N/A Exudate Amount: None Present N/A N/A Exudate Type: N/A N/A N/A Exudate Color: N/A N/A N/A Foul Odor After No N/A N/A Cleansing: Odor Anticipated Due to N/A N/A N/A Product Use: Wound Margin: Flat and Intact N/A N/A Granulation Amount: Large (67-100%) N/A N/A Granulation Quality: Pink N/A N/A Necrotic Amount: None Present (0%) N/A N/A Exposed Structures: Fascia: No N/A N/A Fat: No Tendon: No Muscle: No Joint: No Bone: No Limited to Skin Breakdown Epithelialization: None N/A N/A Periwound Skin Texture: Edema: No N/A N/A Excoriation: No Induration: No Callus: No Crepitus: No Fluctuance: No Friable: No Rash: No Scarring: No Periwound Skin Moist: Yes N/A N/A Moisture: Periwound  Skin Color: Atrophie Blanche: No N/A N/A Cyanosis: No Ecchymosis: No Erythema: No Hemosiderin Staining: No Mottled: No Mccullum, Jamichael ([id number]) Pallor: No Rubor: No Temperature: N/A N/A N/A Tenderness on No N/A N/A Palpation: Wound Preparation: Ulcer Cleansing: N/A N/A Rinsed/Irrigated with Saline Topical Anesthetic Applied: Other: lidocaine 4% Treatment Notes Electronic Signature(s) Signed: 05/10/2015 5:20:37 PM By: Regan Lemming BSN, RN Entered By: Regan Lemming on 05/10/2015 15:37:01 [name] [name] ([id number]) -------------------------------------------------------------------------------- [place] Details Patient Name: [name] [name] Date of Service: 05/10/2015 3:00 PM Medical Record Number: [id number] Patient Account Number: [id number] Date of Birth/Sex: [birth year] (81 y.o. Male) Treating RN: Baruch Gouty, RN, BSN, Velva Harman Primary Care Physician: Alvester Chou Other Clinician: Referring Physician: Alvester Chou Treating Physician/Extender: Frann Rider in Treatment: 4 Active Inactive Abuse / Safety / Falls / Self Care Management Nursing Diagnoses: Impaired physical mobility Potential for falls Goals: Patient will remain injury free Date Initiated: 04/09/2015 Goal Status: Active Interventions: Assess fall risk on admission and as needed Notes: Orientation to the Wound Care Program Nursing Diagnoses: Knowledge deficit related to the wound healing center program Goals: Patient/caregiver will verbalize understanding of the [facility] Date Initiated: 04/09/2015 Goal Status: Active Interventions: Provide education on orientation to the wound center Notes: Venous Leg Ulcer Nursing Diagnoses: Actual venous Insuffiency (use after diagnosis is confirmed) Goals: Patient will maintain optimal edema control LIUM, KULPA ([id number]) Date Initiated: 04/09/2015 Goal Status: Active Interventions: Assess peripheral edema status every visit. Provide education on venous insufficiency Notes: Wound/Skin Impairment Nursing Diagnoses: Impaired tissue integrity Goals: Patient/caregiver will verbalize understanding of skin care regimen Date Initiated: 04/09/2015 Goal Status: Active Ulcer/skin breakdown will have a volume reduction of 30% by week 4 Date Initiated: 04/09/2015 Goal Status: Active Ulcer/skin breakdown will have a volume reduction of 50% by week 8 Date Initiated: 04/09/2015 Goal Status: Active Ulcer/skin breakdown will have a volume reduction of 80% by week 12 Date Initiated: 04/09/2015 Goal Status: Active Ulcer/skin breakdown will heal within 14 weeks Date Initiated:  04/09/2015 Goal Status: Active Interventions: Assess ulceration(s) every visit Notes: Electronic Signature(s) Signed: 05/10/2015 5:20:37 PM By: Regan Lemming BSN, RN Entered By: Regan Lemming on 05/10/2015 15:36:51 [name] [name] ([id number]) -------------------------------------------------------------------------------- Patient/Caregiver Education Details Patient Name: [name] [name] Date of Service: 05/10/2015 3:00 PM Medical Record Number: [id number] Patient Account Number: [id number] Date of Birth/Gender: 01-Apr-1927 (82 y.o. Male) Treating RN: Montey Hora Primary Care Physician: Alvester Chou Other Clinician: Referring Physician: Alvester Chou Treating Physician/Extender: Frann Rider in Treatment: 4 Education Assessment Education Provided To: Patient and Caregiver Education Topics Provided Wound/Skin Impairment: Handouts: Other: wound care as ordered to plantar and dorsal foot Methods: Demonstration, Explain/Verbal Responses: State content correctly Electronic Signature(s) Signed: 05/10/2015 4:24:47 PM By: Montey Hora Entered By: Montey Hora on 05/10/2015 16:24:47 [name] [name] ([id number]) -------------------------------------------------------------------------------- Wound Assessment Details Patient Name: [name] [name] Date of Service: 05/10/2015 3:00 PM Medical Record Number: [id number] Patient Account Number: [id number] Date of Birth/Sex: 01-Jul-1926 (82 y.o. Male) Treating RN: Montey Hora Primary Care Physician: Alvester Chou Other Clinician: Referring Physician: Alvester Chou Treating Physician/Extender: Frann Rider in Treatment: 4 Wound Status Wound Number: 1 Primary Lymphedema Etiology: Wound Location: Right Lower Leg - Circumfernential Wound Open Status: Wounding Event: Gradually Appeared Comorbid Congestive Heart Failure, Date Acquired: 12/28/2014 History: Hypertension, Type II Diabetes, Weeks Of Treatment: 4 Neuropathy Clustered Wound:  Yes Photos Photo Uploaded By: Montey Hora on 05/10/2015 16:19:45 Wound Measurements Length: (cm) 5.7 Width: (cm) 4.7 Depth: (cm) 0.1 Area: (cm) 21.041 Volume: (cm) 2.104 % Reduction in Area:  94.6% % Reduction in Volume: 94.6% Epithelialization: None Tunneling: No Undermining: No Wound Description Classification: Partial Thickness Diabetic Severity Earleen Newport): Grade 0 Wound Margin: Indistinct, nonvisible Exudate Amount: Medium Exudate Type: Serosanguineous Exudate Color: red, brown Foul Odor After Cleansing: No Wound Bed Granulation Amount: Large (67-100%) Exposed Structure Granulation Quality: Pink, Pale Fascia Exposed: No Bump, Heber ([id number]) Necrotic Amount: Small (1-33%) Fat Layer Exposed: No Necrotic Quality: Adherent Slough Tendon Exposed: No Muscle Exposed: No Joint Exposed: No Bone Exposed: No Limited to Skin Breakdown Periwound Skin Texture Texture Color No Abnormalities Noted: No No Abnormalities Noted: No Callus: No Atrophie Blanche: No Crepitus: No Cyanosis: No Excoriation: No Ecchymosis: No Fluctuance: No Erythema: No Friable: No Hemosiderin Staining: No Induration: No Mottled: No Localized Edema: No Pallor: No Rash: No Rubor: No Scarring: No Temperature / Pain Moisture Temperature: No Abnormality No Abnormalities Noted: No Dry / Scaly: No Maceration: Yes Moist: Yes Wound Preparation Ulcer Cleansing: Other: soap AND WATER, Topical Anesthetic Applied: Other: lidocaine 4%, Treatment Notes Wound #1 (Right, Circumferential Lower Leg) 1. Cleansed with: Cleanse wound with antibacterial soap and water 4. Dressing Applied: Aquacel Ag 5. Secondary Dressing Applied ABD Pad 7. Secured with Water engineer) Signed: 05/10/2015 5:05:29 PM By: Montey Hora Entered By: Montey Hora on 05/10/2015 15:25:40 [name] [name]  ([id number]) -------------------------------------------------------------------------------- Wound Assessment Details Patient Name: [name] [name] Date of Service: 05/10/2015 3:00 PM Medical Record Number: [id number] Patient Account Number: [id number] Date of Birth/Sex: 1926-09-06 (82 y.o. Male) Treating RN: Montey Hora Primary Care Physician: Alvester Chou Other Clinician: Referring Physician: Alvester Chou Treating Physician/Extender: Frann Rider in Treatment: 4 Wound Status Wound Number: 2 Primary Lymphedema Etiology: Wound Location: Left Lower Leg - Circumfernential Wound Open Status: Wounding Event: Gradually Appeared Comorbid Congestive Heart Failure, Date Acquired: 12/28/2014 History: Hypertension, Type II Diabetes, Weeks Of Treatment: 4 Neuropathy Clustered Wound: Yes Photos Photo Uploaded By: Montey Hora on 05/10/2015 16:20:14 Wound Measurements Length: (cm) 11.2 Width: (cm) 9.6 Depth: (cm) 0.1 Area: (cm) 84.446 Volume: (cm) 8.445 % Reduction in Area: 68% % Reduction in Volume: 68% Epithelialization: None Tunneling: No Undermining: No Wound Description Classification: Partial Thickness Diabetic Severity (Wagner): Grade 1 Wound Margin: Indistinct, nonvisible Exudate Amount: Medium Exudate Type: Serosanguineous Exudate Color: red, brown Foul Odor After Cleansing: Yes Due to Product Use: No Wound Bed Granulation Amount: Large (67-100%) Exposed Structure Granulation Quality: Red, Pink, Hyper-granulation Fascia Exposed: No Kemler, Yvan ([id number]) Necrotic Amount: None Present (0%) Fat Layer Exposed: No Tendon Exposed: No Muscle Exposed: No Joint Exposed: No Bone Exposed: No Limited to Skin Breakdown Periwound Skin Texture Texture Color No Abnormalities Noted: No No Abnormalities Noted: No Callus: No Atrophie Blanche: No Crepitus: No Cyanosis: No Excoriation: No Ecchymosis: No Fluctuance: No Erythema: No Friable: No Hemosiderin  Staining: No Induration: No Mottled: No Localized Edema: Yes Pallor: No Rash: No Rubor: No Scarring: No Temperature / Pain Moisture Temperature: No Abnormality No Abnormalities Noted: No Dry / Scaly: No Maceration: Yes Moist: Yes Wound Preparation Ulcer Cleansing: Other: soap and water, Topical Anesthetic Applied: Other: lidocaine 4%, Treatment Notes Wound #2 (Left, Circumferential Lower Leg) 1. Cleansed with: Cleanse wound with antibacterial soap and water 4. Dressing Applied: Aquacel Ag 5. Secondary Dressing Applied ABD Pad 7. Secured with Water engineer) Signed: 05/10/2015 5:05:29 PM By: Montey Hora Entered By: Montey Hora on 05/10/2015 15:26:14 [name] [name] ([id number]) -------------------------------------------------------------------------------- Wound Assessment Details Patient Name: [name] [name] Date of Service: 05/10/2015 3:00 PM Medical Record Number: [id number] Patient Account Number: [id number] Date  of Birth/Sex: 1927-03-20 (82 y.o. Male) Treating RN: Montey Hora Primary Care Physician: Alvester Chou Other Clinician: Referring Physician: Alvester Chou Treating Physician/Extender: Frann Rider in Treatment: 4 Wound Status Wound Number: 3 Primary Lymphedema Etiology: Wound Location: Left Foot - Plantar Wound Open Wounding Event: Gradually Appeared Status: Date Acquired: 01/27/2015 Comorbid Congestive Heart Failure, Weeks Of Treatment: 4 History: Hypertension, Type II Diabetes, Clustered Wound: No Neuropathy Photos Photo Uploaded By: Montey Hora on 05/10/2015 16:20:41 Wound Measurements Length: (cm) 1.8 Width: (cm) 2.5 Depth: (cm) 0.1 Area: (cm) 3.534 Volume: (cm) 0.353 % Reduction in Area: 53.1% % Reduction in Volume: 53.2% Epithelialization: Small (1-33%) Tunneling: No Undermining: No Wound Description Classification: Partial Thickness Diabetic Severity (Wagner): Grade 0 Wound Margin:  Indistinct, nonvisibl Exudate Amount: Small Exudate Type: Serous Exudate Color: amber Foul Odor After Cleansing: No e Wound Bed Granulation Amount: Large (67-100%) Exposed Structure Granulation Quality: Pink Fascia Exposed: No Necrotic Amount: Small (1-33%) Fat Layer Exposed: No Gittins, Liban ([id number]) Necrotic Quality: Adherent Slough Tendon Exposed: No Muscle Exposed: No Joint Exposed: No Bone Exposed: No Limited to Skin Breakdown Periwound Skin Texture Texture Color No Abnormalities Noted: No No Abnormalities Noted: No Callus: No Atrophie Blanche: No Crepitus: No Cyanosis: No Excoriation: No Ecchymosis: No Fluctuance: No Erythema: No Friable: No Hemosiderin Staining: No Induration: No Mottled: No Localized Edema: No Pallor: No Rash: No Rubor: No Scarring: No Temperature / Pain Moisture Temperature: No Abnormality No Abnormalities Noted: No Dry / Scaly: No Maceration: Yes Moist: Yes Wound Preparation Ulcer Cleansing: Other: water and soap, Treatment Notes Wound #3 (Left, Plantar Foot) 1. Cleansed with: Cleanse wound with antibacterial soap and water 4. Dressing Applied: Aquacel Ag 5. Secondary Dressing Applied Gauze and Kerlix/Conform 7. Secured with Recruitment consultant) Signed: 05/10/2015 5:05:29 PM By: Montey Hora Entered By: Montey Hora on 05/10/2015 15:27:05 [name] [name] ([id number]) -------------------------------------------------------------------------------- Wound Assessment Details Patient Name: [name] [name] Date of Service: 05/10/2015 3:00 PM Medical Record Number: [id number] Patient Account Number: [id number] Date of Birth/Sex: 1926/06/23 (82 y.o. Male) Treating RN: Montey Hora Primary Care Physician: Alvester Chou Other Clinician: Referring Physician: Alvester Chou Treating Physician/Extender: Frann Rider in Treatment: 4 Wound Status Wound Number: 4 Primary Lymphedema Etiology: Wound Location: Left Foot -  Dorsal Wound Open Wounding Event: Gradually Appeared Status: Date Acquired: 05/03/2015 Comorbid Congestive Heart Failure, Weeks Of Treatment: 0 History: Hypertension, Type II Diabetes, Clustered Wound: No Neuropathy Photos Photo Uploaded By: Montey Hora on 05/10/2015 16:20:41 Wound Measurements Length: (cm) 1.5 Width: (cm) 3 Depth: (cm) 0.1 Area: (cm) 3.534 Volume: (cm) 0.353 % Reduction in Area: % Reduction in Volume: Epithelialization: None Tunneling: No Undermining: No Wound Description Classification: Partial Thickness Foul Odor A Diabetic Severity (Wagner): Grade 0 Wound Margin: Flat and Intact Exudate Amount: None Present fter Cleansing: No Wound Bed Granulation Amount: Large (67-100%) Exposed Structure Granulation Quality: Pink Fascia Exposed: No Necrotic Amount: None Present (0%) Fat Layer Exposed: No Tendon Exposed: No Muscle Exposed: No Robin, [name] ([id number]) Joint Exposed: No Bone Exposed: No Limited to Skin Breakdown Periwound Skin Texture Texture Color No Abnormalities Noted: No No Abnormalities Noted: No Callus: No Atrophie Blanche: No Crepitus: No Cyanosis: No Excoriation: No Ecchymosis: No Fluctuance: No Erythema: No Friable: No Hemosiderin Staining: No Induration: No Mottled: No Localized Edema: No Pallor: No Rash: No Rubor: No Scarring: No Moisture No Abnormalities Noted: No Moist: Yes Wound Preparation Ulcer Cleansing: Rinsed/Irrigated with Saline Topical Anesthetic Applied: Other: lidocaine 4%, Treatment Notes Wound #4 (Left, Dorsal Foot) 1. Cleansed with: Cleanse wound  with antibacterial soap and water 4. Dressing Applied: Aquacel Ag 5. Secondary Dressing Applied Gauze and Kerlix/Conform 7. Secured with Recruitment consultant) Signed: 05/10/2015 5:05:29 PM By: Montey Hora Entered By: Montey Hora on 05/10/2015 15:29:20 [name] [name]  ([id number]) -------------------------------------------------------------------------------- [place] Details Patient Name: [name] [name] Date of Service: 05/10/2015 3:00 PM Medical Record Number: [id number] Patient Account Number: [id number] Date of Birth/Sex: 1926-11-21 (83 y.o. Male) Treating RN: Montey Hora Primary Care Physician: Alvester Chou Other Clinician: Referring Physician: Alvester Chou Treating Physician/Extender: Frann Rider in Treatment: 4 Vital Signs Time Taken: 15:09 Temperature (F): 98.3 Height (in): 70 Pulse (bpm): 68 Weight (lbs): 230 Respiratory Rate (breaths/min): 20 Body Mass Index (BMI): 33 Blood Pressure (mmHg): 128/62 Reference Range: 80 - 120 mg / dl Electronic Signature(s) Signed: 05/10/2015 5:05:29 PM By: Montey Hora Entered By: Montey Hora on 05/10/2015 15:11:29

## 2015-05-11 NOTE — Progress Notes (Signed)
AIRON, SHIFFLETT ([id number]) Visit Report for 05/10/2015 Chief Complaint Document Details Patient Name: [name] [name], [name] [name] Date of Service: 05/10/2015 3:00 PM Medical Record Number: [id number] Patient Account Number: [id number] Date of Birth/Sex: 02-03-1927 (82 y.o. Male) Treating RN: Montey Hora Primary Care Physician: Alvester Chou Other Clinician: Referring Physician: Alvester Chou Treating Physician/Extender: Frann Rider in Treatment: 4 Information Obtained from: Patient Chief Complaint Patient presents to the wound care center for a consult due non healing wound both lower extremities. The patient is a poor historian and was seen here once in July 2016. He says home health was doing his dressings and he was healed but now has recurrence of the problem. Problem has been there at least for 6 months. Electronic Signature(s) Signed: 05/10/2015 3:38:24 PM By: Christin Fudge MD, FACS Entered By: Christin Fudge on 05/10/2015 15:38:24 [name] [name] ([id number]) -------------------------------------------------------------------------------- HPI Details Patient Name: [name] [name] Date of Service: 05/10/2015 3:00 PM Medical Record Number: [id number] Patient Account Number: [id number] Date of Birth/Sex: 10-Mar-1927 (82 y.o. Male) Treating RN: Montey Hora Primary Care Physician: Alvester Chou Other Clinician: Referring Physician: Alvester Chou Treating Physician/Extender: Frann Rider in Treatment: 4 History of Present Illness Location: swelling of the legs right more than left an ulceration both lower extremities Quality: Patient reports experiencing a dull pain to affected area(s). Severity: Patient states wound (s) are getting worse Duration: Patient has had the wound for > 6 months prior to seeking treatment at the wound center Timing: Pain in wound is Intermittent. Context: The wound appeared gradually over time Modifying Factors: he has been having home health come and apply his dressing  daily was healed. Associated Signs and Symptoms: Patient reports having difficulty standing for long periods. HPI Description: [name] [name] is a 82 y.o. male with history of DM, hypertension, chronic kidney disease, gout, diastolic dysfunction and the patient was having increasing swelling with increasing fluid draining from the legs and pain over the last 6 weeks. He says his wounds are completely healed and the home health had stopped coming to do his dressings. Recent hemoglobin A1c was 6.6. During the last admission A venous duplex study revealed no evidence of deep vein thrombosis or superficial thrombosis involving the right and left lower extremity. Was seen in January 2016 by Dr. Adele Barthel at [facility]: Non-Invasive Vascular Imaging ABI (05/05/2014) o R: 0.65 (0.73), DP: mono, PT: mono, TBI: 0.57 o L: 0.65 (0.69), DP: mono, PT: mono, TBI: 0.36 The patient had the diagnosis of bilateral lower extremity moderate peripheral atherosclerotic disease, bilateral lower extremity chronic venous insufficiency. Due to the patient's age and mental status conservative therapy was only discussed and no surgical intervention was planned. The patient has significant dementia and has a caregiver who spends a few hours every day coming to see him. He does not have any family members. Electronic Signature(s) Signed: 05/10/2015 3:38:29 PM By: Christin Fudge MD, FACS Entered By: Christin Fudge on 05/10/2015 15:38:28 [name] [name] ([id number]) -------------------------------------------------------------------------------- Physical Exam Details Patient Name: [name] [name] Date of Service: 05/10/2015 3:00 PM Medical Record Number: [id number] Patient Account Number: [id number] Date of Birth/Sex: Jun 17, 1926 (82 y.o. Male) Treating RN: Montey Hora Primary Care Physician: Alvester Chou Other Clinician: Referring Physician: Alvester Chou Treating Physician/Extender: Frann Rider in Treatment: 4 Constitutional .  Pulse regular. Respirations normal and unlabored. Afebrile. . Eyes Nonicteric. Reactive to light. Ears, Nose, Mouth, and Throat Lips, teeth, and gums WNL.Marland Kitchen Moist mucosa without lesions. Neck supple and nontender. No palpable supraclavicular or cervical adenopathy. Normal sized without goiter. Respiratory  WNL. No retractions.. Cardiovascular Pedal Pulses WNL. No clubbing, cyanosis or edema. Lymphatic No adneopathy. No adenopathy. No adenopathy. Musculoskeletal Adexa without tenderness or enlargement.. Digits and nails w/o clubbing, cyanosis, infection, petechiae, ischemia, or inflammatory conditions.. Integumentary (Hair, Skin) No suspicious lesions. No crepitus or fluctuance. No peri-wound warmth or erythema. No masses.Marland Kitchen Psychiatric Judgement and insight Intact.. No evidence of depression, anxiety, or agitation.. Notes both his lower extremities are looking much better and overall is making an excellent improvement. No debridement was required today. Electronic Signature(s) Signed: 05/10/2015 3:39:01 PM By: Christin Fudge MD, FACS Entered By: Christin Fudge on 05/10/2015 15:39:00 [name] [name] ([id number]) -------------------------------------------------------------------------------- Physician Orders Details Patient Name: [name] [name] Date of Service: 05/10/2015 3:00 PM Medical Record Number: [id number] Patient Account Number: [id number] Date of Birth/Sex: 1926/08/14 (82 y.o. Male) Treating RN: Baruch Gouty, RN, BSN, Velva Harman Primary Care Physician: Alvester Chou Other Clinician: Referring Physician: Alvester Chou Treating Physician/Extender: Frann Rider in Treatment: 4 Verbal / Phone Orders: Yes Clinician: Afful, RN, BSN, Rita Read Back and Verified: Yes Diagnosis Coding Wound Cleansing Wound #1 Right,Circumferential Lower Leg o Cleanse wound with mild soap and water o May Shower, gently pat wound dry prior to applying new dressing. o May shower with protection. o No tub  bath. Wound #2 Left,Circumferential Lower Leg o Cleanse wound with mild soap and water o May Shower, gently pat wound dry prior to applying new dressing. o May shower with protection. o No tub bath. Primary Wound Dressing Wound #1 Right,Circumferential Lower Leg o Aquacel Ag Wound #2 Left,Circumferential Lower Leg o Aquacel Ag Wound #3 Left,Plantar Foot o Aquacel Ag Secondary Dressing Wound #1 Right,Circumferential Lower Leg o ABD pad Wound #2 Left,Circumferential Lower Leg o ABD pad Wound #3 Left,Plantar Foot o ABD pad Dressing Change Frequency Wound #1 Right,Circumferential Lower Leg o Dressing is to be changed Monday and Thursday. LEDGER, SILERIO ([id number]) Wound #2 Left,Circumferential Lower Leg o Dressing is to be changed Monday and Thursday. Wound #3 Left,Plantar Foot o Dressing is to be changed Monday and Thursday. Follow-up Appointments Wound #1 Right,Circumferential Lower Leg o Return Appointment in 1 week. Wound #2 Left,Circumferential Lower Leg o Return Appointment in 1 week. Wound #3 Left,Plantar Foot o Return Appointment in 1 week. Edema Control Wound #1 Right,Circumferential Lower Leg o Unna Boots Bilaterally Wound #2 Left,Circumferential Lower Leg o Unna Boots Bilaterally Wound #3 Left,Plantar Foot o Retail buyer Bilaterally Home Health Wound #1 Right,Circumferential Lower Leg o [place] Visits - [place] Nurse may visit PRN to address patientos wound care needs. o FACE TO FACE ENCOUNTER: MEDICARE and MEDICAID PATIENTS: I certify that this patient is under my care and that I had a face-to-face encounter that meets the physician face-to-face encounter requirements with this patient on this date. The encounter with the patient was in whole or in part for the following MEDICAL CONDITION: (primary reason for [place]) MEDICAL NECESSITY: I certify, that based on my findings, NURSING  services are a medically necessary home health service. HOME BOUND STATUS: I certify that my clinical findings support that this patient is homebound (i.e., Due to illness or injury, pt requires aid of supportive devices such as crutches, cane, wheelchairs, walkers, the use of special transportation or the assistance of another person to leave their place of residence. There is a normal inability to leave the home and doing so requires considerable and taxing effort. Other absences are for medical reasons / religious services and are infrequent or of short duration when for  other reasons). o If current dressing causes regression in wound condition, may D/C ordered dressing product/s and apply Normal Saline Moist Dressing daily until next [place] / Other MD appointment. [place] of regression in wound condition at [phone]. o Please direct any NON-WOUND related issues/requests for orders to patient's Primary Care Physician HARTMAN, STILLSON ([id number]) Wound #2 Left,Circumferential Lower Leg o [place] Visits - [place] Nurse may visit PRN to address patientos wound care needs. o FACE TO FACE ENCOUNTER: MEDICARE and MEDICAID PATIENTS: I certify that this patient is under my care and that I had a face-to-face encounter that meets the physician face-to-face encounter requirements with this patient on this date. The encounter with the patient was in whole or in part for the following MEDICAL CONDITION: (primary reason for [place]) MEDICAL NECESSITY: I certify, that based on my findings, NURSING services are a medically necessary home health service. HOME BOUND STATUS: I certify that my clinical findings support that this patient is homebound (i.e., Due to illness or injury, pt requires aid of supportive devices such as crutches, cane, wheelchairs, walkers, the use of special transportation or the assistance of another  person to leave their place of residence. There is a normal inability to leave the home and doing so requires considerable and taxing effort. Other absences are for medical reasons / religious services and are infrequent or of short duration when for other reasons). o If current dressing causes regression in wound condition, may D/C ordered dressing product/s and apply Normal Saline Moist Dressing daily until next [place] / Other MD appointment. [place] of regression in wound condition at [phone]. o Please direct any NON-WOUND related issues/requests for orders to patient's Primary Care Physician Wound #3 [place] Visits - [place] Nurse may visit PRN to address patientos wound care needs. o FACE TO FACE ENCOUNTER: MEDICARE and MEDICAID PATIENTS: I certify that this patient is under my care and that I had a face-to-face encounter that meets the physician face-to-face encounter requirements with this patient on this date. The encounter with the patient was in whole or in part for the following MEDICAL CONDITION: (primary reason for [place]) MEDICAL NECESSITY: I certify, that based on my findings, NURSING services are a medically necessary home health service. HOME BOUND STATUS: I certify that my clinical findings support that this patient is homebound (i.e., Due to illness or injury, pt requires aid of supportive devices such as crutches, cane, wheelchairs, walkers, the use of special transportation or the assistance of another person to leave their place of residence. There is a normal inability to leave the home and doing so requires considerable and taxing effort. Other absences are for medical reasons / religious services and are infrequent or of short duration when for other reasons). o If current dressing causes regression in wound condition, may D/C ordered dressing product/s and  apply Normal Saline Moist Dressing daily until next [place] / Other MD appointment. [place] of regression in wound condition at [phone]. o Please direct any NON-WOUND related issues/requests for orders to patient's Primary Care Physician Electronic Signature(s) Signed: 05/10/2015 4:38:48 PM By: Christin Fudge MD, FACS Signed: 05/10/2015 5:20:37 PM By: Regan Lemming BSN, RN Entered By: Regan Lemming on 05/10/2015 15:37:37 [name] [name], [name] [name] ([id number]) [name] [name], [name] [name] ([id number]) -------------------------------------------------------------------------------- Problem List Details Patient Name: [name] [name] Date of Service: 05/10/2015 3:00 PM Medical Record Number: [id number]  Patient Account Number: [id number] Date of Birth/Sex: Apr 12, 1927 (82 y.o. Male) Treating RN: Montey Hora Primary Care Physician: Alvester Chou Other Clinician: Referring Physician: Alvester Chou Treating Physician/Extender: Frann Rider in Treatment: 4 Active Problems ICD-10 Encounter Code Description Active Date Diagnosis E11.622 Type 2 diabetes mellitus with other skin ulcer 04/09/2015 Yes I89.0 Lymphedema, not elsewhere classified 04/09/2015 Yes I70.238 Atherosclerosis of native arteries of right leg with 04/09/2015 Yes ulceration of other part of lower right leg I70.248 Atherosclerosis of native arteries of left leg with ulceration 04/09/2015 Yes of other part of lower left leg I87.331 Chronic venous hypertension (idiopathic) with ulcer and 04/09/2015 Yes inflammation of right lower extremity I87.332 Chronic venous hypertension (idiopathic) with ulcer and 04/09/2015 Yes inflammation of left lower extremity Inactive Problems Resolved Problems Electronic Signature(s) Signed: 05/10/2015 3:38:18 PM By: Christin Fudge MD, FACS Entered By: Christin Fudge on 05/10/2015 15:38:18 [name] [name]  ([id number]) -------------------------------------------------------------------------------- Progress Note Details Patient Name: [name] [name] Date of Service: 05/10/2015 3:00 PM Medical Record Number: [id number] Patient Account Number: [id number] Date of Birth/Sex: 11-06-1926 (82 y.o. Male) Treating RN: Montey Hora Primary Care Physician: Alvester Chou Other Clinician: Referring Physician: Alvester Chou Treating Physician/Extender: Frann Rider in Treatment: 4 Subjective Chief Complaint Information obtained from Patient Patient presents to the wound care center for a consult due non healing wound both lower extremities. The patient is a poor historian and was seen here once in July 2016. He says home health was doing his dressings and he was healed but now has recurrence of the problem. Problem has been there at least for 6 months. History of Present Illness (HPI) The following HPI elements were documented for the patient's wound: Location: swelling of the legs right more than left an ulceration both lower extremities Quality: Patient reports experiencing a dull pain to affected area(s). Severity: Patient states wound (s) are getting worse Duration: Patient has had the wound for > 6 months prior to seeking treatment at the wound center Timing: Pain in wound is Intermittent. Context: The wound appeared gradually over time Modifying Factors: he has been having home health come and apply his dressing daily was healed. Associated Signs and Symptoms: Patient reports having difficulty standing for long periods. [name] [name] is a 82 y.o. male with history of DM, hypertension, chronic kidney disease, gout, diastolic dysfunction and the patient was having increasing swelling with increasing fluid draining from the legs and pain over the last 6 weeks. He says his wounds are completely healed and the home health had stopped coming to do his dressings. Recent hemoglobin A1c was 6.6. During  the last admission A venous duplex study revealed no evidence of deep vein thrombosis or superficial thrombosis involving the right and left lower extremity. Was seen in January 2016 by Dr. Adele Barthel at [facility]: Non-Invasive Vascular Imaging ABI (05/05/2014)  R: 0.65 (0.73), DP: mono, PT: mono, TBI: 0.57  L: 0.65 (0.69), DP: mono, PT: mono, TBI: 0.36 The patient had the diagnosis of bilateral lower extremity moderate peripheral atherosclerotic disease, bilateral lower extremity chronic venous insufficiency. Due to the patient's age and mental status conservative therapy was only discussed and no surgical intervention was planned. The patient has significant dementia and has a caregiver who spends a few hours every day coming to see him. He does not have any family members. [name] [name], [name] [name] ([id number]) Objective Constitutional Pulse regular. Respirations normal and unlabored. Afebrile. Vitals Time Taken: 3:09 PM, Height: 70 in, Weight: 230 lbs, BMI: 33, Temperature: 98.3 F, Pulse: 68 bpm, Respiratory  Rate: 20 breaths/min, Blood Pressure: 128/62 mmHg. Eyes Nonicteric. Reactive to light. Ears, Nose, Mouth, and Throat Lips, teeth, and gums WNL.Marland Kitchen Moist mucosa without lesions. Neck supple and nontender. No palpable supraclavicular or cervical adenopathy. Normal sized without goiter. Respiratory WNL. No retractions.. Cardiovascular Pedal Pulses WNL. No clubbing, cyanosis or edema. Lymphatic No adneopathy. No adenopathy. No adenopathy. Musculoskeletal Adexa without tenderness or enlargement.. Digits and nails w/o clubbing, cyanosis, infection, petechiae, ischemia, or inflammatory conditions.Marland Kitchen Psychiatric Judgement and insight Intact.. No evidence of depression, anxiety, or agitation.. General Notes: both his lower extremities are looking much better and overall is making an excellent improvement. No debridement was required today. Integumentary (Hair, Skin) No suspicious lesions. No  crepitus or fluctuance. No peri-wound warmth or erythema. No masses.. Wound #1 status is Open. Original cause of wound was Gradually Appeared. The wound is located on the Right,Circumferential Lower Leg. The wound measures 5.7cm length x 4.7cm width x 0.1cm depth; 21.041cm^2 area and 2.104cm^3 volume. The wound is limited to skin breakdown. There is no tunneling or undermining noted. There is a medium amount of serosanguineous drainage noted. The wound margin is indistinct and nonvisible. There is large (67-100%) pink, pale granulation within the wound bed. There is a small (1-33%) amount of necrotic tissue within the wound bed including Adherent Slough. The periwound [name] [name], [name] [name] ([id number]) skin appearance exhibited: Maceration, Moist. The periwound skin appearance did not exhibit: Callus, Crepitus, Excoriation, Fluctuance, Friable, Induration, Localized Edema, Rash, Scarring, Dry/Scaly, Atrophie Blanche, Cyanosis, Ecchymosis, Hemosiderin Staining, Mottled, Pallor, Rubor, Erythema. Periwound temperature was noted as No Abnormality. Wound #2 status is Open. Original cause of wound was Gradually Appeared. The wound is located on the Left,Circumferential Lower Leg. The wound measures 11.2cm length x 9.6cm width x 0.1cm depth; 84.446cm^2 area and 8.445cm^3 volume. The wound is limited to skin breakdown. There is no tunneling or undermining noted. There is a medium amount of serosanguineous drainage noted. The wound margin is indistinct and nonvisible. There is large (67-100%) red, pink granulation within the wound bed. There is no necrotic tissue within the wound bed. The periwound skin appearance exhibited: Localized Edema, Maceration, Moist. The periwound skin appearance did not exhibit: Callus, Crepitus, Excoriation, Fluctuance, Friable, Induration, Rash, Scarring, Dry/Scaly, Atrophie Blanche, Cyanosis, Ecchymosis, Hemosiderin Staining, Mottled, Pallor, Rubor, Erythema. Periwound temperature was  noted as No Abnormality. Wound #3 status is Open. Original cause of wound was Gradually Appeared. The wound is located on the [place]. The wound measures 1.8cm length x 2.5cm width x 0.1cm depth; 3.534cm^2 area and 0.353cm^3 volume. The wound is limited to skin breakdown. There is no tunneling or undermining noted. There is a small amount of serous drainage noted. The wound margin is indistinct and nonvisible. There is large (67-100%) pink granulation within the wound bed. There is a small (1-33%) amount of necrotic tissue within the wound bed including Adherent Slough. The periwound skin appearance exhibited: Maceration, Moist. The periwound skin appearance did not exhibit: Callus, Crepitus, Excoriation, Fluctuance, Friable, Induration, Localized Edema, Rash, Scarring, Dry/Scaly, Atrophie Blanche, Cyanosis, Ecchymosis, Hemosiderin Staining, Mottled, Pallor, Rubor, Erythema. Periwound temperature was noted as No Abnormality. Wound #4 status is Open. Original cause of wound was Gradually Appeared. The wound is located on the Left,Dorsal Foot. The wound measures 1.5cm length x 3cm width x 0.1cm depth; 3.534cm^2 area and 0.353cm^3 volume. The wound is limited to skin breakdown. There is no tunneling or undermining noted. There is a none present amount of drainage noted. The wound margin is flat and intact. There  is large (67- 100%) pink granulation within the wound bed. There is no necrotic tissue within the wound bed. The periwound skin appearance exhibited: Moist. The periwound skin appearance did not exhibit: Callus, Crepitus, Excoriation, Fluctuance, Friable, Induration, Localized Edema, Rash, Scarring, Atrophie Blanche, Cyanosis, Ecchymosis, Hemosiderin Staining, Mottled, Pallor, Rubor, Erythema. Assessment Active Problems ICD-10 E11.622 - Type 2 diabetes mellitus with other skin ulcer I89.0 - Lymphedema, not elsewhere classified I70.238 - Atherosclerosis of native arteries of  right leg with ulceration of other part of lower right leg I70.248 - Atherosclerosis of native arteries of left leg with ulceration of other part of lower left leg I87.331 - Chronic venous hypertension (idiopathic) with ulcer and inflammation of right lower extremity I87.332 - Chronic venous hypertension (idiopathic) with ulcer and inflammation of left lower extremity [name] [name], [name] [name] ([id number]) Plan Wound Cleansing: Wound #1 Right,Circumferential Lower Leg: Cleanse wound with mild soap and water May Shower, gently pat wound dry prior to applying new dressing. May shower with protection. No tub bath. Wound #2 Left,Circumferential Lower Leg: Cleanse wound with mild soap and water May Shower, gently pat wound dry prior to applying new dressing. May shower with protection. No tub bath. Primary Wound Dressing: Wound #1 Right,Circumferential Lower Leg: Aquacel Ag Wound #2 Left,Circumferential Lower Leg: Aquacel Ag Wound #3 Left,Plantar Foot: Aquacel Ag Secondary Dressing: Wound #1 Right,Circumferential Lower Leg: ABD pad Wound #2 Left,Circumferential Lower Leg: ABD pad Wound #3 Left,Plantar Foot: ABD pad Dressing Change Frequency: Wound #1 Right,Circumferential Lower Leg: Dressing is to be changed Monday and Thursday. Wound #2 Left,Circumferential Lower Leg: Dressing is to be changed Monday and Thursday. Wound #3 Left,Plantar Foot: Dressing is to be changed Monday and Thursday. Follow-up Appointments: Wound #1 Right,Circumferential Lower Leg: Return Appointment in 1 week. Wound #2 Left,Circumferential Lower Leg: Return Appointment in 1 week. Wound #3 Left,Plantar Foot: Return Appointment in 1 week. Edema Control: Wound #1 Right,Circumferential Lower Leg: [employer] Bilaterally [name] [name], [name] [name] ([id number]) Wound #2 Left,Circumferential Lower Leg: Unna Boots Bilaterally Wound #3 Left,Plantar Foot: Retail buyer Bilaterally Home Health: Wound #1 Right,Circumferential Lower  Leg: Continue Home Health Visits - [place] Nurse may visit PRN to address patient s wound care needs. FACE TO FACE ENCOUNTER: MEDICARE and MEDICAID PATIENTS: I certify that this patient is under my care and that I had a face-to-face encounter that meets the physician face-to-face encounter requirements with this patient on this date. The encounter with the patient was in whole or in part for the following MEDICAL CONDITION: (primary reason for [place]) MEDICAL NECESSITY: I certify, that based on my findings, NURSING services are a medically necessary home health service. HOME BOUND STATUS: I certify that my clinical findings support that this patient is homebound (i.e., Due to illness or injury, pt requires aid of supportive devices such as crutches, cane, wheelchairs, walkers, the use of special transportation or the assistance of another person to leave their place of residence. There is a normal inability to leave the home and doing so requires considerable and taxing effort. Other absences are for medical reasons / religious services and are infrequent or of short duration when for other reasons). If current dressing causes regression in wound condition, may D/C ordered dressing product/s and apply Normal Saline Moist Dressing daily until next [place] / Other MD appointment. [place] of regression in wound condition at [phone]. Please direct any NON-WOUND related issues/requests for orders to patient's Primary Care Physician Wound #2 Left,Circumferential Lower Leg: [place] Visits -  [place] Nurse may visit PRN to address patient s wound care needs. FACE TO FACE ENCOUNTER: MEDICARE and MEDICAID PATIENTS: I certify that this patient is under my care and that I had a face-to-face encounter that meets the physician face-to-face encounter requirements with this patient on this date. The encounter with the patient  was in whole or in part for the following MEDICAL CONDITION: (primary reason for [place]) MEDICAL NECESSITY: I certify, that based on my findings, NURSING services are a medically necessary home health service. HOME BOUND STATUS: I certify that my clinical findings support that this patient is homebound (i.e., Due to illness or injury, pt requires aid of supportive devices such as crutches, cane, wheelchairs, walkers, the use of special transportation or the assistance of another person to leave their place of residence. There is a normal inability to leave the home and doing so requires considerable and taxing effort. Other absences are for medical reasons / religious services and are infrequent or of short duration when for other reasons). If current dressing causes regression in wound condition, may D/C ordered dressing product/s and apply Normal Saline Moist Dressing daily until next [place] / Other MD appointment. [place] of regression in wound condition at [phone]. Please direct any NON-WOUND related issues/requests for orders to patient's Primary Care Physician Wound #3 Left,Pl[place]ar Foot: [place] Visits - [place] Nurse may visit PRN to address patient s wound care needs. FACE TO FACE ENCOUNTER: MEDICARE and MEDICAID PATIENTS: I certify that this patient is under my care and that I had a face-to-face encounter that meets the physician face-to-face encounter requirements with this patient on this date. The encounter with the patient was in whole or in part for the following MEDICAL CONDITION: (primary reason for [place]) MEDICAL NECESSITY: I certify, that based on my findings, NURSING services are a medically necessary home health service. HOME BOUND STATUS: I certify that my clinical findings support that this patient is homebound (i.e., Due to illness or injury, pt requires aid of supportive devices such as  crutches, cane, wheelchairs, walkers, the use of special transportation or the assistance of another person to leave their place of residence. There is a normal inability to leave the home and doing so requires considerable and taxing effort. Other absences are [name] [name], [name] [name] ([id number]) for medical reasons / religious services and are infrequent or of short duration when for other reasons). If current dressing causes regression in wound condition, may D/C ordered dressing product/s and apply Normal Saline Moist Dressing daily until next [place] / Other MD appointment. [place] of regression in wound condition at [phone]. Please direct any NON-WOUND related issues/requests for orders to patient's Primary Care Physician We will recommend silver alginate and Unna's boots bilaterally. He has a caregiver with him today and gave explained to her that it's imperative that this dressing gets changed regularly by home health and they should do it twice a week. He was seen by Dr. Adele Barthel at [facility] and we have referred him back for an opinion. He is also urged to come to see as an regular basis at weekly intervals. Electronic Signature(s) Signed: 05/10/2015 3:39:09 PM By: Christin Fudge MD, FACS Entered By: Christin Fudge on 05/10/2015 15:39:09 [name] [name] ([id number]) -------------------------------------------------------------------------------- SuperBill Details Patient Name: [name] [name] Date of Service: 05/10/2015 Medical Record Number: [id number] Patient Account Number: [id number] Date of Birth/Sex: 26-Apr-1927 (82 y.o. 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 left leg Chronic venous hypertension (idiopathic) with ulcer and inflammation of right lower I87.331 extremity Chronic venous hypertension (idiopathic) with ulcer and inflammation of left lower I87.332 extremity Facility Procedures CPT4 Code: [id number] Description: 29580 - APPLY UNNA BOOT/PROFO BILATERAL Modifier: Quantity: 1 Physician Procedures CPT4: Description Modifier Quantity Code [id number] - WC PHYS LEVEL 3 - EST PT 1 ICD-10 Description Diagnosis E11.622 Type 2 diabetes mellitus with other skin ulcer I89.0 Lymphedema, not elsewhere classified I70.238 Atherosclerosis of native arteries  of right leg with ulceration of other part of lower right leg I70.248 Atherosclerosis of native arteries of left leg with ulceration of other part of lower left leg Electronic Signature(s) Signed: 05/10/2015 4:38:48 PM By: Christin Fudge MD, FACS Signed: 05/10/2015 5:20:37 PM By: Regan Lemming BSN, RN Previous Signature: 05/10/2015 3:39:23 PM Version By: Christin Fudge MD, FACS Entered By: Regan Lemming on 05/10/2015 16:19:05

## 2015-05-15 ENCOUNTER — Encounter: Payer: Self-pay | Admitting: Vascular Surgery

## 2015-05-15 ENCOUNTER — Encounter: Payer: Medicare Other | Admitting: Surgery

## 2015-05-15 DIAGNOSIS — E11622 Type 2 diabetes mellitus with other skin ulcer: Secondary | ICD-10-CM | POA: Diagnosis not present

## 2015-05-16 NOTE — Progress Notes (Signed)
ABHIJEET, CLEAVENGER ([id number]) Visit Report for 05/15/2015 Arrival Information Details Patient Name: [name] [name], [name] [name] Date of Service: 05/15/2015 2:45 PM Medical Record Number: [id number] Patient Account Number: [id number] Date of Birth/Sex: May 14, 1926 (82 y.o. Male) Treating RN: Montey Hora Primary Care Physician: Alvester Chou Other Clinician: Referring Physician: Alvester Chou Treating Physician/Extender: Frann Rider in Treatment: 5 Visit Information History Since Last Visit Added or deleted any medications: No Patient Arrived: Walker Any new allergies or adverse reactions: No Arrival Time: 14:35 Had a fall or experienced change in No Accompanied By: self activities of daily living that may affect Transfer Assistance: None risk of falls: Patient Identification Verified: Yes Signs or symptoms of abuse/neglect since last No Secondary Verification Process Yes visito Completed: Hospitalized since last visit: No Patient Has Alerts: Yes Pain Present Now: No Patient Alerts: DMII AIB [place] BILATERAL >220 Electronic Signature(s) Signed: 05/15/2015 5:04:45 PM By: Montey Hora Entered By: Montey Hora on 05/15/2015 14:43:26 [name] [name] ([id number]) -------------------------------------------------------------------------------- Encounter Discharge Information Details Patient Name: [name] [name] Date of Service: 05/15/2015 2:45 PM Medical Record Number: [id number] Patient Account Number: [id number] Date of Birth/Sex: 03-30-1927 (82 y.o. Male) Treating RN: Montey Hora Primary Care Physician: Alvester Chou Other Clinician: Referring Physician: Alvester Chou Treating Physician/Extender: Frann Rider in Treatment: 5 Encounter Discharge Information Items Discharge Pain Level: 0 Discharge Condition: Stable Ambulatory Status: Walker Discharge Destination: Home Transportation: Private Auto Accompanied By: self Schedule Follow-up Appointment: Yes Medication Reconciliation  completed No and provided to Patient/Care Disney Ruggiero: Provided on Clinical Summary of Care: 05/15/2015 Form Type Recipient Paper Patient KL Electronic Signature(s) Signed: 05/15/2015 3:32:58 PM By: Ruthine Dose Entered By: Ruthine Dose on 05/15/2015 15:32:58 [name] [name] ([id number]) -------------------------------------------------------------------------------- Lower Extremity Assessment Details Patient Name: [name] [name] Date of Service: 05/15/2015 2:45 PM Medical Record Number: [id number] Patient Account Number: [id number] Date of Birth/Sex: [birth year] (83 y.o. Male) Treating RN: Montey Hora Primary Care Physician: Alvester Chou Other Clinician: Referring Physician: Alvester Chou Treating Physician/Extender: Frann Rider in Treatment: 5 Edema Assessment Assessed: [Left: No] [Right: No] Edema: [Left: Yes] [Right: Yes] Calf Left: Right: Point of Measurement: 35 cm From Medial Instep 37.5 cm 37 cm Ankle Left: Right: Point of Measurement: 11 cm From Medial Instep 26.5 cm 28 cm Vascular Assessment Pulses: Posterior Tibial Palpable: [Left:No] [Right:No] Doppler: [Left:Multiphasic] [Right:Inaudible] Dorsalis Pedis Palpable: [Left:No] [Right:No] Doppler: [Left:Monophasic] [Right:Inaudible] Extremity colors, hair growth, and conditions: Extremity Color: [Left:Hyperpigmented] [Right:Hyperpigmented] Hair Growth on Extremity: [Left:No] [Right:No] Temperature of Extremity: [Left:Warm] [Right:Warm] Capillary Refill: [Left:< 3 seconds] [Right:< 3 seconds] Electronic Signature(s) Signed: 05/15/2015 5:04:45 PM By: Montey Hora Entered By: Montey Hora on 05/15/2015 14:45:04 [name] [name] ([id number]) -------------------------------------------------------------------------------- Multi Wound Chart Details Patient Name: [name] [name] Date of Service: 05/15/2015 2:45 PM Medical Record Number: [id number] Patient Account Number: [id number] Date of Birth/Sex: 19-Feb-1927 (82 y.o.  Male) Treating RN: Montey Hora Primary Care Physician: Alvester Chou Other Clinician: Referring Physician: Alvester Chou Treating Physician/Extender: Frann Rider in Treatment: 5 Vital Signs Height(in): 70 Pulse(bpm): 69 Weight(lbs): 230 Blood Pressure 128/67 (mmHg): Body Mass Index(BMI): 33 Temperature(F): 98.1 Respiratory Rate 18 (breaths/min): Photos: [1:No Photos] [2:No Photos] [3:No Photos] Wound Location: [1:Right Lower Leg - Circumfernential] [2:Left Lower Leg - Circumfernential] [3:Left Foot - Plantar] Wounding Event: [1:Gradually Appeared] [2:Gradually Appeared] [3:Gradually Appeared] Primary Etiology: [1:Lymphedema] [2:Lymphedema] [3:Lymphedema] Comorbid History: [1:Congestive Heart Failure, Hypertension, Type II Diabetes, Neuropathy] [2:Congestive Heart Failure, Hypertension, Type II Diabetes, Neuropathy] [3:Congestive Heart Failure, Hypertension, Type II Diabetes, Neuropathy] Date Acquired: [1:12/28/2014] [2:12/28/2014] [3:01/27/2015] Weeks of Treatment: [1:5] [2:5] [3:5] Wound Status: [1:Open] [  2:Open] [3:Open] Clustered Wound: [1:Yes] [2:Yes] [3:No] Measurements L x W x D 4.2x2.7x0.1 [2:10.4x9x0.1] [3:1.3x2.5x0.1] (cm) Area (cm) : [1:8.906] [2:73.513] [3:2.553] Volume (cm) : [1:0.891] [2:7.351] [3:0.255] % Reduction in Area: [1:97.70%] [2:72.10%] [3:66.10%] % Reduction in Volume: 97.70% [2:72.10%] [3:66.20%] Classification: [1:Partial Thickness] [2:Partial Thickness] [3:Partial Thickness] HBO Classification: [1:Grade 0] [2:Grade 1] [3:Grade 0] Exudate Amount: [1:Medium] [2:Large] [3:Medium] Exudate Type: [1:Serosanguineous] [2:Serosanguineous] [3:Serous] Exudate Color: [1:red, brown] [2:red, brown] [3:amber] Foul Odor After [1:No] [2:Yes] [3:No] Cleansing: Odor Anticipated Due to N/A [2:No] [3:N/A] Product Use: Wound Margin: [1:Indistinct, nonvisible] [2:Indistinct, nonvisible] [3:Indistinct, nonvisible] Granulation Amount: [1:Large (67-100%)] [2:Large  (67-100%)] [3:Large (67-100%)] Granulation Quality: [1:Pink, Pale] [3:Pink] Red, Pink, Hyper- granulation Necrotic Amount: Small (1-33%) None Present (0%) Small (1-33%) Exposed Structures: Fascia: No Fascia: No Fascia: No Fat: No Fat: No Fat: No Tendon: No Tendon: No Tendon: No Muscle: No Muscle: No Muscle: No Joint: No Joint: No Joint: No Bone: No Bone: No Bone: No Limited to Skin Limited to Skin Limited to Skin Breakdown Breakdown Breakdown Epithelialization: None None Small (1-33%) Periwound Skin Texture: Edema: No Edema: Yes Edema: No Excoriation: No Excoriation: No Excoriation: No Induration: No Induration: No Induration: No Callus: No Callus: No Callus: No Crepitus: No Crepitus: No Crepitus: No Fluctuance: No Fluctuance: No Fluctuance: No Friable: No Friable: No Friable: No Rash: No Rash: No Rash: No Scarring: No Scarring: No Scarring: No Periwound Skin Maceration: Yes Maceration: Yes Maceration: Yes Moisture: Moist: Yes Moist: Yes Moist: Yes Dry/Scaly: No Dry/Scaly: No Dry/Scaly: No Periwound Skin Color: Atrophie Blanche: No Atrophie Blanche: No Atrophie Blanche: No Cyanosis: No Cyanosis: No Cyanosis: No Ecchymosis: No Ecchymosis: No Ecchymosis: No Erythema: No Erythema: No Erythema: No Hemosiderin Staining: No Hemosiderin Staining: No Hemosiderin Staining: No Mottled: No Mottled: No Mottled: No Pallor: No Pallor: No Pallor: No Rubor: No Rubor: No Rubor: No Temperature: No Abnormality No Abnormality No Abnormality Tenderness on No No No Palpation: Wound Preparation: Ulcer Cleansing: Other: Ulcer Cleansing: Other: Ulcer Cleansing: Other: soap AND WATER soap and water water and soap Topical Anesthetic Topical Anesthetic Applied: None Applied: None Wound Number: 4 N/A N/A Photos: No Photos N/A N/A Wound Location: Left, Dorsal Foot N/A N/A Wounding Event: Gradually Appeared N/A N/A Primary Etiology: Lymphedema N/A  N/A Comorbid History: N/A N/A N/A Date Acquired: 05/03/2015 N/A N/A Weeks of Treatment: 0 N/A N/A Wound Status: Healed - Epithelialized N/A N/A Schillaci, [name] ([id number]) Clustered Wound: No N/A N/A Measurements L x W x D 0x0x0 N/A N/A (cm) Area (cm) : 0 N/A N/A Volume (cm) : 0 N/A N/A % Reduction in Area: 100.00% N/A N/A % Reduction in Volume: 100.00% N/A N/A Classification: Partial Thickness N/A N/A HBO Classification: N/A N/A N/A Exudate Amount: N/A N/A N/A Exudate Type: N/A N/A N/A Exudate Color: N/A N/A N/A Foul Odor After N/A N/A N/A Cleansing: Odor Anticipated Due to N/A N/A N/A Product Use: Wound Margin: N/A N/A N/A Granulation Amount: N/A N/A N/A Granulation Quality: N/A N/A N/A Necrotic Amount: N/A N/A N/A Exposed Structures: N/A N/A N/A Epithelialization: N/A N/A N/A Periwound Skin Texture: No Abnormalities Noted N/A N/A Periwound Skin No Abnormalities Noted N/A N/A Moisture: Periwound Skin Color: No Abnormalities Noted N/A N/A Temperature: N/A N/A N/A Tenderness on No N/A N/A Palpation: Wound Preparation: N/A N/A N/A Treatment Notes Electronic Signature(s) Signed: 05/15/2015 3:02:02 PM By: Montey Hora Entered By: Montey Hora on 05/15/2015 15:02:02 [name] [name] ([id number]) -------------------------------------------------------------------------------- [place] Details Patient Name: [name] [name] Date of Service: 05/15/2015 2:45 PM Medical Record  Number: [id number] Patient Account Number: [id number] Date of Birth/Sex: August 18, 1926 (82 y.o. Male) Treating RN: Montey Hora Primary Care Physician: Alvester Chou Other Clinician: Referring Physician: Alvester Chou Treating Physician/Extender: Frann Rider in Treatment: 5 Active Inactive Abuse / Safety / Falls / Self Care Management Nursing Diagnoses: Impaired physical mobility Potential for falls Goals: Patient will remain injury free Date Initiated: 04/09/2015 Goal Status:  Active Interventions: Assess fall risk on admission and as needed Notes: Orientation to the Wound Care Program Nursing Diagnoses: Knowledge deficit related to the wound healing center program Goals: Patient/caregiver will verbalize understanding of the [place] Program Date Initiated: 04/09/2015 Goal Status: Active Interventions: Provide education on orientation to the wound center Notes: Venous Leg Ulcer Nursing Diagnoses: Actual venous Insuffiency (use after diagnosis is confirmed) Goals: Patient will maintain optimal edema control DEWAYNE, KOK ([id number]) Date Initiated: 04/09/2015 Goal Status: Active Interventions: Assess peripheral edema status every visit. Provide education on venous insufficiency Notes: Wound/Skin Impairment Nursing Diagnoses: Impaired tissue integrity Goals: Patient/caregiver will verbalize understanding of skin care regimen Date Initiated: 04/09/2015 Goal Status: Active Ulcer/skin breakdown will have a volume reduction of 30% by week 4 Date Initiated: 04/09/2015 Goal Status: Active Ulcer/skin breakdown will have a volume reduction of 50% by week 8 Date Initiated: 04/09/2015 Goal Status: Active Ulcer/skin breakdown will have a volume reduction of 80% by week 12 Date Initiated: 04/09/2015 Goal Status: Active Ulcer/skin breakdown will heal within 14 weeks Date Initiated: 04/09/2015 Goal Status: Active Interventions: Assess ulceration(s) every visit Notes: Electronic Signature(s) Signed: 05/15/2015 3:01:54 PM By: Montey Hora Entered By: Montey Hora on 05/15/2015 15:01:54 [name] [name] ([id number]) -------------------------------------------------------------------------------- Patient/Caregiver Education Details Patient Name: [name] [name] Date of Service: 05/15/2015 2:45 PM Medical Record Number: [id number] Patient Account Number: [id number] Date of Birth/Gender: 1926-07-25 (82 y.o. Male) Treating RN: Montey Hora Primary  Care Physician: Alvester Chou Other Clinician: Referring Physician: Alvester Chou Treating Physician/Extender: Frann Rider in Treatment: 5 Education Assessment Education Provided To: Patient Education Topics Provided Venous: Handouts: Other: purpose of compression wraps Methods: Explain/Verbal Responses: State content correctly Electronic Signature(s) Signed: 05/15/2015 3:02:37 PM By: Montey Hora Entered By: Montey Hora on 05/15/2015 15:02:37 [name] [name] ([id number]) -------------------------------------------------------------------------------- Wound Assessment Details Patient Name: [name] [name] Date of Service: 05/15/2015 2:45 PM Medical Record Number: [id number] Patient Account Number: [id number] Date of Birth/Sex: January 18, 1927 (82 y.o. Male) Treating RN: Montey Hora Primary Care Physician: Alvester Chou Other Clinician: Referring Physician: Alvester Chou Treating Physician/Extender: Frann Rider in Treatment: 5 Wound Status Wound Number: 1 Primary Lymphedema Etiology: Wound Location: Right Lower Leg - Circumfernential Wound Open Status: Wounding Event: Gradually Appeared Comorbid Congestive Heart Failure, Date Acquired: 12/28/2014 History: Hypertension, Type II Diabetes, Weeks Of Treatment: 5 Neuropathy Clustered Wound: Yes Photos Photo Uploaded By: Montey Hora on 05/15/2015 16:30:28 Wound Measurements Length: (cm) 4.2 Width: (cm) 2.7 Depth: (cm) 0.1 Area: (cm) 8.906 Volume: (cm) 0.891 % Reduction in Area: 97.7% % Reduction in Volume: 97.7% Epithelialization: None Tunneling: No Undermining: No Wound Description Classification: Partial Thickness Diabetic Severity Earleen Newport): Grade 0 Wound Margin: Indistinct, nonvisible Exudate Amount: Medium Exudate Type: Serosanguineous Exudate Color: red, brown Foul Odor After Cleansing: No Wound Bed Granulation Amount: Large (67-100%) Exposed Structure Granulation Quality: Pink, Pale Fascia  Exposed: No Pipkins, Omauri ([id number]) Necrotic Amount: Small (1-33%) Fat Layer Exposed: No Necrotic Quality: Adherent Slough Tendon Exposed: No Muscle Exposed: No Joint Exposed: No Bone Exposed: No Limited to Skin Breakdown Periwound Skin Texture Texture Color No Abnormalities Noted: No No Abnormalities Noted: No Callus: No Atrophie  Blanche: No Crepitus: No Cyanosis: No Excoriation: No Ecchymosis: No Fluctuance: No Erythema: No Friable: No Hemosiderin Staining: No Induration: No Mottled: No Localized Edema: No Pallor: No Rash: No Rubor: No Scarring: No Temperature / Pain Moisture Temperature: No Abnormality No Abnormalities Noted: No Dry / Scaly: No Maceration: Yes Moist: Yes Wound Preparation Ulcer Cleansing: Other: soap AND WATER, Topical Anesthetic Applied: None Treatment Notes Wound #1 (Right, Circumferential Lower Leg) 1. Cleansed with: Cleanse wound with antibacterial soap and water 4. Dressing Applied: Aquacel Ag 5. Secondary Dressing Applied ABD Pad 7. Secured with Water engineer) Signed: 05/15/2015 3:01:06 PM By: Montey Hora Entered By: Montey Hora on 05/15/2015 15:01:06 [name] [name] ([id number]) -------------------------------------------------------------------------------- Wound Assessment Details Patient Name: [name] [name] Date of Service: 05/15/2015 2:45 PM Medical Record Number: [id number] Patient Account Number: [id number] Date of Birth/Sex: 12-07-1926 (82 y.o. Male) Treating RN: Montey Hora Primary Care Physician: Alvester Chou Other Clinician: Referring Physician: Alvester Chou Treating Physician/Extender: Frann Rider in Treatment: 5 Wound Status Wound Number: 2 Primary Lymphedema Etiology: Wound Location: Left Lower Leg - Circumfernential Wound Open Status: Wounding Event: Gradually Appeared Comorbid Congestive Heart Failure, Date Acquired: 12/28/2014 History: Hypertension, Type II  Diabetes, Weeks Of Treatment: 5 Neuropathy Clustered Wound: Yes Photos Photo Uploaded By: Montey Hora on 05/15/2015 16:30:29 Wound Measurements Length: (cm) 10.4 Width: (cm) 9 Depth: (cm) 0.1 Area: (cm) 73.513 Volume: (cm) 7.351 % Reduction in Area: 72.1% % Reduction in Volume: 72.1% Epithelialization: None Tunneling: No Undermining: No Wound Description Classification: Partial Thickness Diabetic Severity (Wagner): Grade 1 Wound Margin: Indistinct, nonvisible Exudate Amount: Large Exudate Type: Serosanguineous Exudate Color: red, brown Foul Odor After Cleansing: Yes Due to Product Use: No Wound Bed Granulation Amount: Large (67-100%) Exposed Structure Granulation Quality: Red, Pink, Hyper-granulation Fascia Exposed: No Beer, Drakkar ([id number]) Necrotic Amount: None Present (0%) Fat Layer Exposed: No Tendon Exposed: No Muscle Exposed: No Joint Exposed: No Bone Exposed: No Limited to Skin Breakdown Periwound Skin Texture Texture Color No Abnormalities Noted: No No Abnormalities Noted: No Callus: No Atrophie Blanche: No Crepitus: No Cyanosis: No Excoriation: No Ecchymosis: No Fluctuance: No Erythema: No Friable: No Hemosiderin Staining: No Induration: No Mottled: No Localized Edema: Yes Pallor: No Rash: No Rubor: No Scarring: No Temperature / Pain Moisture Temperature: No Abnormality No Abnormalities Noted: No Dry / Scaly: No Maceration: Yes Moist: Yes Wound Preparation Ulcer Cleansing: Other: soap and water, Topical Anesthetic Applied: None Treatment Notes Wound #2 (Left, Circumferential Lower Leg) 1. Cleansed with: Cleanse wound with antibacterial soap and water 4. Dressing Applied: Aquacel Ag 5. Secondary Dressing Applied ABD Pad 7. Secured with Water engineer) Signed: 05/15/2015 3:01:27 PM By: Montey Hora Entered By: Montey Hora on 05/15/2015 15:01:26 [name] [name]  ([id number]) -------------------------------------------------------------------------------- Wound Assessment Details Patient Name: [name] [name] Date of Service: 05/15/2015 2:45 PM Medical Record Number: [id number] Patient Account Number: [id number] Date of Birth/Sex: 07-29-1926 (82 y.o. Male) Treating RN: Montey Hora Primary Care Physician: Alvester Chou Other Clinician: Referring Physician: Alvester Chou Treating Physician/Extender: Frann Rider in Treatment: 5 Wound Status Wound Number: 3 Primary Lymphedema Etiology: Wound Location: Left Foot - Plantar Wound Open Wounding Event: Gradually Appeared Status: Date Acquired: 01/27/2015 Comorbid Congestive Heart Failure, Weeks Of Treatment: 5 History: Hypertension, Type II Diabetes, Clustered Wound: No Neuropathy Photos Photo Uploaded By: Montey Hora on 05/15/2015 16:30:56 Wound Measurements Length: (cm) 1.3 Width: (cm) 2.5 Depth: (cm) 0.1 Area: (cm) 2.553 Volume: (cm) 0.255 % Reduction in Area: 66.1% % Reduction in Volume: 66.2% Epithelialization:  Small (1-33%) Tunneling: No Undermining: No Wound Description Classification: Partial Thickness Diabetic Severity Earleen Newport): Grade 0 Wound Margin: Indistinct, nonvisibl Exudate Amount: Medium Exudate Type: Serous Exudate Color: amber Foul Odor After Cleansing: No e Wound Bed Granulation Amount: Large (67-100%) Exposed Structure Granulation Quality: Pink Fascia Exposed: No Necrotic Amount: Small (1-33%) Fat Layer Exposed: No Devilla, Chandan ([id number]) Necrotic Quality: Adherent Slough Tendon Exposed: No Muscle Exposed: No Joint Exposed: No Bone Exposed: No Limited to Skin Breakdown Periwound Skin Texture Texture Color No Abnormalities Noted: No No Abnormalities Noted: No Callus: No Atrophie Blanche: No Crepitus: No Cyanosis: No Excoriation: No Ecchymosis: No Fluctuance: No Erythema: No Friable: No Hemosiderin Staining: No Induration: No Mottled:  No Localized Edema: No Pallor: No Rash: No Rubor: No Scarring: No Temperature / Pain Moisture Temperature: No Abnormality No Abnormalities Noted: No Dry / Scaly: No Maceration: Yes Moist: Yes Wound Preparation Ulcer Cleansing: Other: water and soap, Treatment Notes Wound #3 (Left, Plantar Foot) 1. Cleansed with: Cleanse wound with antibacterial soap and water 4. Dressing Applied: Aquacel Ag 5. Secondary Dressing Applied Gauze and Kerlix/Conform 7. Secured with Recruitment consultant) Signed: 05/15/2015 3:01:45 PM By: Montey Hora Entered By: Montey Hora on 05/15/2015 15:01:45 [name] [name] ([id number]) -------------------------------------------------------------------------------- Wound Assessment Details Patient Name: [name] [name] Date of Service: 05/15/2015 2:45 PM Medical Record Number: [id number] Patient Account Number: [id number] Date of Birth/Sex: [birth year] (82 y.o. Male) Treating RN: Montey Hora Primary Care Physician: Alvester Chou Other Clinician: Referring Physician: Alvester Chou Treating Physician/Extender: Frann Rider in Treatment: 5 Wound Status Wound Number: 4 Primary Etiology: Lymphedema Wound Location: Left, Dorsal Foot Wound Status: Healed - Epithelialized Wounding Event: Gradually Appeared Date Acquired: 05/03/2015 Weeks Of Treatment: 0 Clustered Wound: No Photos Photo Uploaded By: Montey Hora on 05/15/2015 16:30:56 Wound Measurements Length: (cm) 0 % Reduction in Width: (cm) 0 % Reduction in Depth: (cm) 0 Area: (cm) 0 Volume: (cm) 0 Area: 100% Volume: 100% Wound Description Classification: Partial Thickness Periwound Skin Texture Texture Color No Abnormalities Noted: No No Abnormalities Noted: No Moisture No Abnormalities Noted: No Electronic Signature(s) Signed: 05/15/2015 5:04:45 PM By: Velvet Bathe ([id number]) Entered By: Montey Hora on 05/15/2015 14:53:01 [name] [name]  ([id number]) -------------------------------------------------------------------------------- Vitals Details Patient Name: [name] [name] Date of Service: 05/15/2015 2:45 PM Medical Record Number: [id number] Patient Account Number: [id number] Date of Birth/Sex: [birth year] (82 y.o. Male) Treating RN: Montey Hora Primary Care Physician: Alvester Chou Other Clinician: Referring Physician: Alvester Chou Treating Physician/Extender: Frann Rider in Treatment: 5 Vital Signs Time Taken: 14:43 Temperature (F): 98.1 Height (in): 70 Pulse (bpm): 69 Weight (lbs): 230 Respiratory Rate (breaths/min): 18 Body Mass Index (BMI): 33 Blood Pressure (mmHg): 128/67 Reference Range: 80 - 120 mg / dl Electronic Signature(s) Signed: 05/15/2015 5:04:45 PM By: Montey Hora Entered By: Montey Hora on 05/15/2015 14:44:12

## 2015-05-16 NOTE — Progress Notes (Signed)
[name] [name], [name] [name] ([id number]) Visit Report for 05/15/2015 Chief Complaint Document Details Patient Name: [name] [name], [name] [name] Date of Service: 05/15/2015 2:45 PM Medical Record Number: [id number] Patient Account Number: [id number] Date of Birth/Sex: [name] [name] (82 y.o. Male) Treating RN: Montey Hora Primary Care Physician: Alvester Chou Other Clinician: Referring Physician: Alvester Chou Treating Physician/Extender: Frann Rider in Treatment: 5 Information Obtained from: Patient Chief Complaint Patient presents to the wound care center for a consult due non healing wound both lower extremities. The patient is a poor historian and was seen here once in July 2016. He says home health was doing his dressings and he was healed but now has recurrence of the problem. Problem has been there at least for 6 months. Electronic Signature(s) Signed: 05/15/2015 3:25:33 PM By: Christin Fudge MD, FACS Entered By: Christin Fudge on 05/15/2015 15:25:33 [name] [name] ([id number]) -------------------------------------------------------------------------------- HPI Details Patient Name: [name] [name] Date of Service: 05/15/2015 2:45 PM Medical Record Number: [id number] Patient Account Number: [id number] Date of Birth/Sex: [name] [name], [name] [name] (82 y.o. Male) Treating RN: Montey Hora Primary Care Physician: Alvester Chou Other Clinician: Referring Physician: Alvester Chou Treating Physician/Extender: Frann Rider in Treatment: 5 History of Present Illness Location: swelling of the legs right more than left an ulceration both lower extremities Quality: Patient reports experiencing a dull pain to affected area(s). Severity: Patient states wound (s) are getting worse Duration: Patient has had the wound for > 6 months prior to seeking treatment at the wound center Timing: Pain in wound is Intermittent. Context: The wound appeared gradually over time Modifying Factors: he has been having home health come and apply his dressing  daily was healed. Associated Signs and Symptoms: Patient reports having difficulty standing for long periods. HPI Description: [name] [name] is a 82 y.o. male with history of DM, hypertension, chronic kidney disease, gout, diastolic dysfunction and the patient was having increasing swelling with increasing fluid draining from the legs and pain over the last 6 weeks. He says his wounds are completely healed and the home health had stopped coming to do his dressings. Recent hemoglobin A1c was 6.6. During the last admission A venous duplex study revealed no evidence of deep vein thrombosis or superficial thrombosis involving the right and left lower extremity. Was seen in January 2016 by Dr. Adele Barthel at [facility]: Non-Invasive Vascular Imaging ABI (05/05/2014) o R: 0.65 (0.73), DP: mono, PT: mono, TBI: 0.57 o L: 0.65 (0.69), DP: mono, PT: mono, TBI: 0.36 The patient had the diagnosis of bilateral lower extremity moderate peripheral atherosclerotic disease, bilateral lower extremity chronic venous insufficiency. Due to the patient's age and mental status conservative therapy was only discussed and no surgical intervention was planned. The patient has significant dementia and has a caregiver who spends a few hours every day coming to see him. He does not have any family members. Electronic Signature(s) Signed: 05/15/2015 3:25:39 PM By: Christin Fudge MD, FACS Entered By: Christin Fudge on 05/15/2015 15:25:39 [name] [name] ([id number]) -------------------------------------------------------------------------------- Physical Exam Details Patient Name: [name] [name] Date of Service: 05/15/2015 2:45 PM Medical Record Number: [id number] Patient Account Number: [id number] Date of Birth/Sex: [birth year] (82 y.o. Male) Treating RN: Montey Hora Primary Care Physician: Alvester Chou Other Clinician: Referring Physician: Alvester Chou Treating Physician/Extender: Frann Rider in Treatment: 5 Constitutional .  Pulse regular. Respirations normal and unlabored. Afebrile. . Eyes Nonicteric. Reactive to light. Ears, Nose, Mouth, and Throat Lips, teeth, and gums WNL.Marland Kitchen Moist mucosa without lesions. Neck supple and nontender. No palpable supraclavicular or cervical adenopathy. Normal sized without goiter. Respiratory  WNL. No retractions.. Cardiovascular Pedal Pulses WNL. No clubbing, cyanosis or edema. Chest Breasts symmetical and no nipple discharge.. Breast tissue WNL, no masses, lumps, or tenderness.. Lymphatic No adneopathy. No adenopathy. No adenopathy. Musculoskeletal Adexa without tenderness or enlargement.. Digits and nails w/o clubbing, cyanosis, infection, petechiae, ischemia, or inflammatory conditions.. Integumentary (Hair, Skin) No suspicious lesions. No crepitus or fluctuance. No peri-wound warmth or erythema. No masses.Marland Kitchen Psychiatric Judgement and insight Intact.. No evidence of depression, anxiety, or agitation.. Notes the patient's lower extremities continue to have stigmata of lymphedema stage II but the wounds are healing nicely with his local care and compression. No debridement was required today. Electronic Signature(s) Signed: 05/15/2015 3:26:25 PM By: Christin Fudge MD, FACS Entered By: Christin Fudge on 05/15/2015 15:26:25 [name] [name] ([id number]) -------------------------------------------------------------------------------- Physician Orders Details Patient Name: [name] [name] Date of Service: 05/15/2015 2:45 PM Medical Record Number: [id number] Patient Account Number: [id number] Date of Birth/Sex: [name] [name] (82 y.o. 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 o [name] shower with protection. o No tub bath. Wound #2 Left,Circumferential Lower Leg o Cleanse wound with mild soap and water o [name] Shower, gently pat wound dry prior to applying new dressing. o [name] shower with protection. o No tub bath. Wound #3 Left,Plantar Foot o Cleanse wound with mild soap and water o [name] Shower, gently pat wound dry prior to applying new dressing. o [name] shower with protection. o No tub bath. Primary Wound Dressing Wound #1 Right,Circumferential Lower Leg o Aquacel Ag Wound #2 Left,Circumferential Lower Leg o Aquacel Ag Falletta, Zaylen ([id number]) Wound #3 Left,Plantar Foot o Aquacel Ag Secondary Dressing Wound #1 Right,Circumferential Lower Leg o ABD pad Wound #2 Left,Circumferential Lower Leg o ABD pad - and xtrasorb if needed Wound #3 Left,Plantar Foot o ABD pad Dressing Change Frequency Wound #1 Right,Circumferential Lower Leg o Three times weekly Wound #2 Left,Circumferential Lower Leg o Three times weekly Wound #3 Left,Plantar Foot o Three times weekly Follow-up Appointments Wound #1 Right,Circumferential Lower Leg o Return Appointment in 1 week. Wound #2 Left,Circumferential Lower Leg o Return Appointment in 1 week. Wound #3 Left,Plantar Foot o Return Appointment in  1 week. Edema Control Wound #1 Right,Circumferential Lower Leg o Unna Boots Bilaterally Wound #2 Left,Circumferential Lower Leg o Unna Boots Bilaterally Wound #3 Left,Plantar Foot o [facility] Wound #1 Right,Circumferential Lower Leg [name] [name], [name] [name] ([id number]) o [place] Visits - [place] please order Juzo compression wraps for patient. Measurements as follows: L ankle - 27cm, calf - 38cm; R ankle - 28cm, 37cm; heel to knee length 47cm o Home Health Nurse [name] visit PRN to address patientos wound care needs. o FACE TO FACE ENCOUNTER: MEDICARE and MEDICAID PATIENTS:  I certify that this patient is under my care and that I had a face-to-face encounter that meets the physician face-to-face encounter requirements with this patient on this date. The encounter with the patient was in whole or in part for the following MEDICAL CONDITION: (primary reason for [place]) MEDICAL NECESSITY: I certify, that based on my findings, NURSING services are a medically necessary home health service. HOME BOUND STATUS: I certify that my clinical findings support that this patient is homebound (i.e., Due to illness or injury, pt requires aid of supportive devices such as crutches, cane, wheelchairs, walkers, the use of special transportation or the assistance of another person to leave their place of residence. There is a normal inability to leave the home and doing so requires considerable and taxing effort. Other absences are for medical reasons / religious services and are infrequent or of short duration when for other reasons). o If current dressing causes regression in wound condition, [name] D/C ordered dressing product/s and apply Normal Saline Moist Dressing daily until next [place] / Other MD appointment. [place] of regression in wound condition at [phone]. o Please direct any NON-WOUND related  issues/requests for orders to patient's Primary Care Physician Electronic Signature(s) Signed: 05/15/2015 4:40:06 PM By: Montey Hora Signed: 05/15/2015 4:40:58 PM By: Christin Fudge MD, FACS Previous Signature: 05/15/2015 3:35:32 PM Version By: Christin Fudge MD, FACS Entered By: Montey Hora on 05/15/2015 16:40:05 [name] [name] ([id number]) -------------------------------------------------------------------------------- Problem List Details Patient Name: [name] [name] Date of Service: 05/15/2015 2:45 PM Medical Record Number: [id number] Patient Account Number: [id number] Date of Birth/Sex: [birth year] (82 y.o. Male) Treating RN: Montey Hora Primary Care Physician: Alvester Chou Other Clinician: Referring Physician: Alvester Chou Treating Physician/Extender: Frann Rider in Treatment: 5 Active Problems ICD-10 Encounter Code Description Active Date Diagnosis E11.622 Type 2 diabetes mellitus with other skin ulcer 04/09/2015 Yes I89.0 Lymphedema, not elsewhere classified 04/09/2015 Yes I70.238 Atherosclerosis of native arteries of right leg with 04/09/2015 Yes ulceration of other part of lower right leg I70.248 Atherosclerosis of native arteries of left leg with ulceration 04/09/2015 Yes of other part of lower left leg I87.331 Chronic venous hypertension (idiopathic) with ulcer and 04/09/2015 Yes inflammation of right lower extremity I87.332 Chronic venous hypertension (idiopathic) with ulcer and 04/09/2015 Yes inflammation of left lower extremity Inactive Problems Resolved Problems Electronic Signature(s) Signed: 05/15/2015 3:25:26 PM By: Christin Fudge MD, FACS Entered By: Christin Fudge on 05/15/2015 15:25:26 [name] [name] ([id number]) -------------------------------------------------------------------------------- Progress Note Details Patient Name: [name] [name] Date of Service: 05/15/2015 2:45 PM Medical Record Number: [id number] Patient Account Number: [id number] Date of  Birth/Sex: [name] [name] (82 y.o. Male) Treating RN: Montey Hora Primary Care Physician: Alvester Chou Other Clinician: Referring Physician: Alvester Chou Treating Physician/Extender: Frann Rider in Treatment: 5 Subjective Chief Complaint Information obtained from Patient Patient presents to the wound care center for a consult due non healing wound both lower extremities. The patient is a poor historian and was seen here once in July 2016. He says home health was doing his dressings and he was healed but now has recurrence of the problem. Problem has been there at least for 6 months. History of Present Illness (HPI) The following HPI elements were documented for the patient's wound: Location: swelling of the legs right more than left an ulceration both lower extremities Quality: Patient reports experiencing a dull pain to affected area(s). Severity: Patient states wound (s) are getting worse Duration: Patient has had the wound for > 6 months prior to  seeking treatment at the wound center Timing: Pain in wound is Intermittent. Context: The wound appeared gradually over time Modifying Factors: he has been having home health come and apply his dressing daily was healed. Associated Signs and Symptoms: Patient reports having difficulty standing for long periods. [name] [name] is a 82 y.o. male with history of DM, hypertension, chronic kidney disease, gout, diastolic dysfunction and the patient was having increasing swelling with increasing fluid draining from the legs and pain over the last 6 weeks. He says his wounds are completely healed and the home health had stopped coming to do his dressings. Recent hemoglobin A1c was 6.6. During the last admission A venous duplex study revealed no evidence of deep vein thrombosis or superficial thrombosis involving the right and left lower extremity. Was seen in January 2016 by Dr. Adele Barthel at [facility]: Non-Invasive Vascular Imaging ABI (05/05/2014)   R: 0.65 (0.73), DP: mono, PT: mono, TBI: 0.57  L: 0.65 (0.69), DP: mono, PT: mono, TBI: 0.36 The patient had the diagnosis of bilateral lower extremity moderate peripheral atherosclerotic disease, bilateral lower extremity chronic venous insufficiency. Due to the patient's age and mental status conservative therapy was only discussed and no surgical intervention was planned. The patient has significant dementia and has a caregiver who spends a few hours every day coming to see him. He does not have any family members. [name] [name], [name] [name] ([id number]) Objective Constitutional Pulse regular. Respirations normal and unlabored. Afebrile. Vitals Time Taken: 2:43 PM, Height: 70 in, Weight: 230 lbs, BMI: 33, Temperature: 98.1 F, Pulse: 69 bpm, Respiratory Rate: 18 breaths/min, Blood Pressure: 128/67 mmHg. Eyes Nonicteric. Reactive to light. Ears, Nose, Mouth, and Throat Lips, teeth, and gums WNL.Marland Kitchen Moist mucosa without lesions. Neck supple and nontender. No palpable supraclavicular or cervical adenopathy. Normal sized without goiter. Respiratory WNL. No retractions.. Cardiovascular Pedal Pulses WNL. No clubbing, cyanosis or edema. Chest Breasts symmetical and no nipple discharge.. Breast tissue WNL, no masses, lumps, or tenderness.. Lymphatic No adneopathy. No adenopathy. No adenopathy. Musculoskeletal Adexa without tenderness or enlargement.. Digits and nails w/o clubbing, cyanosis, infection, petechiae, ischemia, or inflammatory conditions.Marland Kitchen Psychiatric Judgement and insight Intact.. No evidence of depression, anxiety, or agitation.. General Notes: the patient's lower extremities continue to have stigmata of lymphedema stage II but the wounds are healing nicely with his local care and compression. No debridement was required today. Integumentary (Hair, Skin) No suspicious lesions. No crepitus or fluctuance. No peri-wound warmth or erythema. No masses.. Wound #1 status is Open. Original  cause of wound was Gradually Appeared. The wound is located on the Right,Circumferential Lower Leg. The wound measures 4.2cm length x 2.7cm width x 0.1cm depth; 8.906cm^2 area and 0.891cm^3 volume. The wound is limited to skin breakdown. There is no tunneling or [name] [name], [name] [name] ([id number]) undermining noted. There is a medium amount of serosanguineous drainage noted. The wound margin is indistinct and nonvisible. There is large (67-100%) pink, pale granulation within the wound bed. There is a small (1-33%) amount of necrotic tissue within the wound bed including Adherent Slough. The periwound skin appearance exhibited: Maceration, Moist. The periwound skin appearance did not exhibit: Callus, Crepitus, Excoriation, Fluctuance, Friable, Induration, Localized Edema, Rash, Scarring, Dry/Scaly, Atrophie Blanche, Cyanosis, Ecchymosis, Hemosiderin Staining, Mottled, Pallor, Rubor, Erythema. Periwound temperature was noted as No Abnormality. Wound #2 status is Open. Original cause of wound was Gradually Appeared. The wound is located on the Left,Circumferential Lower Leg. The wound measures 10.4cm length x 9cm width x 0.1cm depth; 73.513cm^2 area and 7.351cm^3 volume. The wound  is limited to skin breakdown. There is no tunneling or undermining noted. There is a large amount of serosanguineous drainage noted. The wound margin is indistinct and nonvisible. There is large (67-100%) red, pink granulation within the wound bed. There is no necrotic tissue within the wound bed. The periwound skin appearance exhibited: Localized Edema, Maceration, Moist. The periwound skin appearance did not exhibit: Callus, Crepitus, Excoriation, Fluctuance, Friable, Induration, Rash, Scarring, Dry/Scaly, Atrophie Blanche, Cyanosis, Ecchymosis, Hemosiderin Staining, Mottled, Pallor, Rubor, Erythema. Periwound temperature was noted as No Abnormality. Wound #3 status is Open. Original cause of wound was Gradually Appeared. The  wound is located on the [place]. The wound measures 1.3cm length x 2.5cm width x 0.1cm depth; 2.553cm^2 area and 0.255cm^3 volume. The wound is limited to skin breakdown. There is no tunneling or undermining noted. There is a medium amount of serous drainage noted. The wound margin is indistinct and nonvisible. There is large (67-100%) pink granulation within the wound bed. There is a small (1-33%) amount of necrotic tissue within the wound bed including Adherent Slough. The periwound skin appearance exhibited: Maceration, Moist. The periwound skin appearance did not exhibit: Callus, Crepitus, Excoriation, Fluctuance, Friable, Induration, Localized Edema, Rash, Scarring, Dry/Scaly, Atrophie Blanche, Cyanosis, Ecchymosis, Hemosiderin Staining, Mottled, Pallor, Rubor, Erythema. Periwound temperature was noted as No Abnormality. Wound #4 status is Healed - Epithelialized. Original cause of wound was Gradually Appeared. The wound is located on the Left,Dorsal Foot. The wound measures 0cm length x 0cm width x 0cm depth; 0cm^2 area and 0cm^3 volume. 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 I70.248 - Atherosclerosis of native arteries of left leg with ulceration of other part of lower left leg I87.331 - Chronic venous hypertension (idiopathic) with ulcer and inflammation of right lower extremity I87.332 - Chronic venous hypertension (idiopathic) with ulcer and inflammation of left lower extremity [name] [name], [name] [name] ([id number]) I have recommended silver alginate and Unna's boots to be applied and changed twice weekly. I have also recommended elevation and exercise and will order him juzo compression stockings with the Velcro wrap. He will come back and see [place] next week. Plan Wound  Cleansing: Wound #1 Right,Circumferential Lower Leg: Cleanse wound with mild soap and water [name] Shower, gently pat wound dry prior to applying new dressing. [name] shower with protection. No tub bath. Wound #2 Left,Circumferential Lower Leg: Cleanse wound with mild soap and water [name] Shower, gently pat wound dry prior to applying new dressing. [name] shower with protection. No tub bath. Wound #3 Left,Plantar Foot: Cleanse wound with mild soap and water [name] Shower, gently pat wound dry prior to applying new dressing. [name] shower with protection. No tub bath. Primary Wound Dressing: Wound #1 Right,Circumferential Lower Leg: Aquacel Ag Wound #2 Left,Circumferential Lower Leg: Aquacel Ag Wound #3 Left,Plantar Foot: Aquacel Ag Secondary Dressing: Wound #1 Right,Circumferential Lower Leg: ABD pad Wound #2 Left,Circumferential Lower Leg: ABD pad - and xtrasorb if needed Wound #3 Left,Plantar Foot: ABD pad Dressing Change Frequency: Wound #1 Right,Circumferential Lower Leg: Three times weekly Wound #2 Left,Circumferential Lower Leg: Three times weekly Wound #3 Left,Plantar Foot: Three times weekly [name] [name], [name] [name] ([id number]) Follow-up Appointments: Wound #1 Right,Circumferential Lower Leg: Return Appointment in 1 week. Wound #2 Left,Circumferential Lower Leg: Return Appointment in 1 week. Wound #3 Left,Plantar Foot: Return Appointment in 1 week. Edema Control: Wound #1 Right,Circumferential  Lower Leg: Unna Boots Bilaterally Wound #2 Left,Circumferential Lower Leg: Retail buyer Bilaterally Wound #3 Left,Plantar Foot: Retail buyer Bilaterally Home Health: Wound #1 Right,Circumferential Lower Leg: Continue Home Health Visits - [place] please order Juzo compression wraps for patient. Measurements as follows: L ankle - 27cm, calf - 38cm; R ankle - 28cm, 37cm; heel to knee length 47cm Home Health Nurse [name] visit PRN to address patient s wound care needs. FACE TO FACE ENCOUNTER:  MEDICARE and MEDICAID PATIENTS: I certify that this patient is under my care and that I had a face-to-face encounter that meets the physician face-to-face encounter requirements with this patient on this date. The encounter with the patient was in whole or in part for the following MEDICAL CONDITION: (primary reason for [place]) MEDICAL NECESSITY: I certify, that based on my findings, NURSING services are a medically necessary home health service. HOME BOUND STATUS: I certify that my clinical findings support that this patient is homebound (i.e., Due to illness or injury, pt requires aid of supportive devices such as crutches, cane, wheelchairs, walkers, the use of special transportation or the assistance of another person to leave their place of residence. There is a normal inability to leave the home and doing so requires considerable and taxing effort. Other absences are for medical reasons / religious services and are infrequent or of short duration when for other reasons). If current dressing causes regression in wound condition, [name] D/C ordered dressing product/s and apply Normal Saline Moist Dressing daily until next [place] / Other MD appointment. [place] of regression in wound condition at [phone]. Please direct any NON-WOUND related issues/requests for orders to patient's Primary Care Physician I have recommended silver alginate and Unna's boots to be applied and changed twice weekly. I have also recommended elevation and exercise and will order him juzo compression stockings with the Velcro wrap. He will come back and see [place] next week. Electronic Signature(s) Signed: 05/15/2015 4:42:10 PM By: Christin Fudge MD, FACS Previous Signature: 05/15/2015 3:39:46 PM Version By: Christin Fudge MD, FACS [name] [name] ([id number]) Previous Signature: 05/15/2015 3:27:56 PM Version By: Christin Fudge MD, FACS Entered By: Christin Fudge on 05/15/2015  16:42:10 [name] [name] ([id number]) -------------------------------------------------------------------------------- SuperBill Details Patient Name: [name] [name] Date of Service: 05/15/2015 Medical Record Number: [id number] Patient Account Number: [id number] Date of Birth/Sex: [name] [name] (82 y.o. 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 I70.248 Atherosclerosis of native arteries of left leg with ulceration of other part of lower left leg Chronic venous hypertension (idiopathic) with ulcer and inflammation of right lower I87.331 extremity Chronic venous hypertension (idiopathic) with ulcer and inflammation of left lower I87.332 extremity Facility Procedures CPT4 Code: [id number] Description: 29580 - APPLY UNNA BOOT/PROFO BILATERAL Modifier: Quantity: 1 Physician Procedures CPT4: Description Modifier Quantity Code [id number] - WC PHYS LEVEL 3 - EST PT 1 ICD-10 Description Diagnosis E11.622 Type 2 diabetes mellitus with other skin ulcer I89.0 Lymphedema, not elsewhere classified I87.331 Chronic venous hypertension  (idiopathic) with ulcer and inflammation of right lower extremity I87.332 Chronic venous hypertension (idiopathic) with ulcer and inflammation of left lower extremity Electronic Signature(s) Signed: 05/15/2015 4:14:55 PM By: Montey Hora Signed: 05/15/2015 4:29:09 PM  By: Christin Fudge MD, FACS Previous Signature: 05/15/2015 3:28:37 PM Version By: Christin Fudge MD, FACS Entered By: Montey Hora on 05/15/2015 16:14:54

## 2015-05-17 ENCOUNTER — Encounter (HOSPITAL_COMMUNITY): Payer: Medicare Other

## 2015-05-17 ENCOUNTER — Ambulatory Visit (HOSPITAL_COMMUNITY)
Admission: RE | Admit: 2015-05-17 | Discharge: 2015-05-17 | Disposition: A | Discharge status: Home / Self Care | Payer: Medicare Other | Source: Ambulatory Visit | Attending: Vascular Surgery | Admitting: Vascular Surgery

## 2015-05-17 DIAGNOSIS — M7989 Other specified soft tissue disorders: Secondary | ICD-10-CM | POA: Insufficient documentation

## 2015-05-17 DIAGNOSIS — E119 Type 2 diabetes mellitus without complications: Secondary | ICD-10-CM | POA: Insufficient documentation

## 2015-05-17 DIAGNOSIS — I10 Essential (primary) hypertension: Secondary | ICD-10-CM | POA: Insufficient documentation

## 2015-05-17 DIAGNOSIS — M79609 Pain in unspecified limb: Secondary | ICD-10-CM | POA: Insufficient documentation

## 2015-05-18 ENCOUNTER — Ambulatory Visit: Payer: Medicare Other | Admitting: Vascular Surgery

## 2015-05-21 ENCOUNTER — Encounter: Payer: Medicare Other | Admitting: Surgery

## 2015-05-21 DIAGNOSIS — E11622 Type 2 diabetes mellitus with other skin ulcer: Secondary | ICD-10-CM | POA: Diagnosis not present

## 2015-05-21 NOTE — Progress Notes (Addendum)
[name] [name] ([id number]) Visit Report for 05/21/2015 Chief Complaint Document Details Patient Name: [name] [name] Date of Service: 05/21/2015 12:45 PM Medical Record Number: [id number] Patient Account Number: [id number] Date of Birth/Sex: Oct 24, 1926 (82 y.o. Male) Treating RN: Montey Hora Primary Care Physician: Alvester Chou Other Clinician: Referring Physician: Alvester Chou Treating Physician/Extender: Frann Rider in Treatment: 6 Information Obtained from: Patient Chief Complaint Patient presents to the wound care center for a consult due non healing wound both lower extremities. The patient is a poor historian and [name] [name] seen here once in July 2016. He says home health [name] [name] doing his dressings and he [name] [name] healed but now has recurrence of the problem. Problem has been there at least for 6 months. Electronic Signature(s) Signed: 05/21/2015 1:14:18 PM By: Christin Fudge MD, FACS Entered By: Christin Fudge on 05/21/2015 13:14:18 [name] [name] ([id number]) -------------------------------------------------------------------------------- HPI Details Patient Name: [name] [name] Date of Service: 05/21/2015 12:45 PM Medical Record Number: [id number] Patient Account Number: [id number] Date of Birth/Sex: 1926/12/03 (82 y.o. Male) Treating RN: Montey Hora Primary Care Physician: Alvester Chou Other Clinician: Referring Physician: Alvester Chou Treating Physician/Extender: Frann Rider in Treatment: 6 History of Present Illness Location: swelling of the legs right more than left an ulceration both lower extremities Quality: Patient reports experiencing a dull pain to affected area(s). Severity: Patient states wound (s) are getting worse Duration: Patient has had the wound for > 6 months prior to seeking treatment at the wound center Timing: Pain in wound is Intermittent. Context: The wound appeared gradually over time Modifying Factors: he has been having home health come and apply his  dressing daily [name] [name] healed. Associated Signs and Symptoms: Patient reports having difficulty standing for long periods. HPI Description: [name] [name] is a 82 y.o. male with history of DM, hypertension, chronic kidney disease, gout, diastolic dysfunction and the patient [name] [name] having increasing swelling with increasing fluid draining from the legs and pain over the last 6 weeks. He says his wounds are completely healed and the home health had stopped coming to do his dressings. Recent hemoglobin A1c [name] [name] 6.6. During the last admission A venous duplex study revealed no evidence of deep vein thrombosis or superficial thrombosis involving the right and left lower extremity. [name] [name] seen in January 2016 by Dr. Adele Barthel at [facility]: Non-Invasive Vascular Imaging ABI (05/05/2014) o R: 0.65 (0.73), DP: mono, PT: mono, TBI: 0.57 o L: 0.65 (0.69), DP: mono, PT: mono, TBI: 0.36 The patient had the diagnosis of bilateral lower extremity moderate peripheral atherosclerotic disease, bilateral lower extremity chronic venous insufficiency. Due to the patient's age and mental status conservative therapy [name] [name] only discussed and no surgical intervention [name] [name] planned. The patient has significant dementia and has a caregiver who spends a few hours every day coming to see him. He does not have any family members. 05/21/2015 -- lower extremity arterial studies done on 05/17/2015 show the right ABI is 0.50-0.79 indicating moderate arterial occlusive disease at rest. The left ABI [name] [name] 0.5-0.79 indicating moderate arterial occlusive disease at rest. The left posterior tibial and peroneal arteries were not evaluated due to local wound condition. The right TBI [name] [name] less than 0.69 and the left ABI [name] [name] less than 0.69 which are both abnormal. Since it's been a year since the previous opinion from Dr. Bridgett Larsson I have asked him to see him for a review. Electronic Signature(s) Signed: 05/21/2015 1:21:21 PM By: Christin Fudge MD, FACS Previous  Signature: 05/21/2015 12:09:41 PM Version By: Christin Fudge MD, FACS Previous Signature: 05/21/2015 12:09:16 PM  Version By: Christin Fudge MD, FACS Entered By: Christin Fudge on 05/21/2015 13:21:21 [name] [name], [name] [name] ([id number], CHAUSSEE ([id number]) -------------------------------------------------------------------------------- Physical Exam Details Patient Name: [name] [name] Date of Service: 05/21/2015 12:45 PM Medical Record Number: [id number] Patient Account Number: [id number] Date of Birth/Sex: [birth year] (82 y.o. Male) Treating RN: Montey Hora Primary Care Physician: Alvester Chou Other Clinician: Referring Physician: Alvester Chou Treating Physician/Extender: Frann Rider in Treatment: 6 Constitutional . Pulse regular. Respirations normal and unlabored. Afebrile. . Eyes Nonicteric. Reactive to light. Ears, Nose, Mouth, and Throat Lips, teeth, and gums WNL.Marland Kitchen Moist mucosa without lesions. Neck supple and nontender. No palpable supraclavicular or cervical adenopathy. Normal sized without goiter. Respiratory WNL. No retractions.. Cardiovascular Pedal Pulses WNL. No clubbing, cyanosis or edema. Gastrointestinal (GI) Abdomen without masses or tenderness.. No liver or spleen enlargement or tenderness.. Lymphatic No adneopathy. No adenopathy. No adenopathy. Musculoskeletal Adexa without tenderness or enlargement.. Digits and nails w/o clubbing, cyanosis, infection, petechiae, ischemia, or inflammatory conditions.. Integumentary (Hair, Skin) No suspicious lesions. No crepitus or fluctuance. No peri-wound warmth or erythema. No masses.Marland Kitchen Psychiatric Judgement and insight Intact.. No evidence of depression, anxiety, or agitation.. Notes The wounds continue to get better and his lymphedema is looking good. He has been tolerating and Unna's boots compression with no adverse effects. Electronic Signature(s) Signed: 05/21/2015 1:22:19 PM By: Christin Fudge MD, FACS Entered By: Christin Fudge on 05/21/2015 13:22:18 [name] [name] ([id number]) -------------------------------------------------------------------------------- Physician Orders Details Patient Name: [name] [name] Date of Service: 05/21/2015 12:45 PM Medical Record Number: [id number] Patient Account Number: [id number] Date of Birth/Sex: 21-Dec-1926 (82 y.o. 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 o May shower with protection. o No tub bath. Wound #2 Left,Circumferential Lower Leg o Cleanse wound with mild soap and water o May Shower, gently pat wound dry prior to applying new dressing. o May shower with protection. o No tub bath. Wound #3 Left,Plantar Foot o Cleanse wound with mild soap and water o May Shower, gently pat wound dry prior to applying new dressing. o May shower with protection. o No tub bath. Primary Wound Dressing Wound #1 Right,Circumferential Lower Leg o Aquacel Ag Wound #2 Left,Circumferential Lower Leg o Aquacel Ag Rebman,  Leobardo ([id number]) Wound #3 Left,Plantar Foot o Aquacel Ag Secondary Dressing Wound #1 Right,Circumferential Lower Leg o ABD pad Wound #2 Left,Circumferential Lower Leg o ABD pad - and xtrasorb if needed Wound #3 Left,Plantar Foot o ABD pad Dressing Change Frequency Wound #1 Right,Circumferential Lower Leg o Three times weekly Wound #2 Left,Circumferential Lower Leg o Three times weekly Wound #3 Left,Plantar Foot o Three times weekly Follow-up Appointments Wound #1 Right,Circumferential Lower Leg o Return Appointment in 1 week. Wound #2 Left,Circumferential Lower Leg o Return Appointment  in 1 week. Wound #3 Left,Plantar Foot o Return Appointment in 1 week. Edema Control Wound #1 Right,Circumferential Lower Leg o Unna Boots Bilaterally Wound #2 Left,Circumferential Lower Leg o Unna Boots Bilaterally Wound #3 Left,Plantar Foot o [facility] Wound #1 Right,Circumferential Lower Leg [name] [name], [name] [name] ([id number]) o [place] Visits - [place] please order Juzo compression wraps for patient. Measurements as follows: L ankle - 27cm, calf - 38cm; R ankle - 28cm, 37cm; heel to knee length 47cm o Home Health Nurse may visit PRN to address patientos wound care needs. o FACE TO FACE ENCOUNTER: MEDICARE and MEDICAID PATIENTS: I certify that this patient is under my care and that I had a face-to-face encounter that meets the physician face-to-face encounter requirements with this patient on this date. The encounter with the patient [name] [name] in whole or in part for the following MEDICAL CONDITION: (primary reason for [place]) MEDICAL NECESSITY: I certify, that based on my findings, NURSING services are a medically necessary home health service. HOME BOUND STATUS: I certify that my clinical findings support that this patient is homebound (i.e., Due to illness or injury, pt requires aid of supportive devices such as  crutches, cane, wheelchairs, walkers, the use of special transportation or the assistance of another person to leave their place of residence. There is a normal inability to leave the home and doing so requires considerable and taxing effort. Other absences are for medical reasons / religious services and are infrequent or of short duration when for other reasons). o If current dressing causes regression in wound condition, may D/C ordered dressing product/s and apply Normal Saline Moist Dressing daily until next [place] / Other MD appointment. [place] of regression in wound condition at [phone]. o Please direct any NON-WOUND related issues/requests for orders to patient's Primary Care Physician Wound #2 Left,Circumferential Lower Leg o [place] please order Juzo compression wraps for patient. Measurements as follows: L ankle - 27cm, calf - 38cm; R ankle - 28cm, 37cm; heel to knee length 47cm o Home Health Nurse may visit PRN to address patientos wound care needs. o FACE TO FACE ENCOUNTER: MEDICARE and MEDICAID PATIENTS: I certify that this patient is under my care and that I had a face-to-face encounter that meets the physician face-to-face encounter requirements with this patient on this date. The encounter with the patient [name] [name] in whole or in part for the following MEDICAL CONDITION: (primary reason for [place]) MEDICAL NECESSITY: I certify, that based on my findings, NURSING services are a medically necessary home health service. HOME BOUND STATUS: I certify that my clinical findings support that this patient is homebound (i.e., Due to illness or injury, pt requires aid of supportive devices such as crutches, cane, wheelchairs, walkers, the use of special transportation or the assistance of another person to leave their place of residence. There is a normal inability to leave the home and doing so  requires considerable and taxing effort. Other absences are for medical reasons / religious services and are infrequent or of short duration when for other reasons). o If current dressing causes regression in wound condition, may D/C ordered dressing product/s and apply Normal Saline Moist Dressing daily until next [place] / Other MD appointment. [place] of regression in wound condition at [phone]. o Please direct any NON-WOUND related issues/requests for orders to patient's Primary Care Physician Wound #3 Left,Plantar Foot o Continue  Home Health Visits - [place] please order Juzo compression wraps for patient. Measurements as follows: L ankle - 27cm, calf - 38cm; R ankle - 28cm, 37cm; heel to knee length 47cm o Home Health Nurse may visit PRN to address patientos wound care needs. [name] [name], [name] [name] ([id number]) o FACE TO FACE ENCOUNTER: MEDICARE and MEDICAID PATIENTS: I certify that this patient is under my care and that I had a face-to-face encounter that meets the physician face-to-face encounter requirements with this patient on this date. The encounter with the patient [name] [name] in whole or in part for the following MEDICAL CONDITION: (primary reason for [place]) MEDICAL NECESSITY: I certify, that based on my findings, NURSING services are a medically necessary home health service. HOME BOUND STATUS: I certify that my clinical findings support that this patient is homebound (i.e., Due to illness or injury, pt requires aid of supportive devices such as crutches, cane, wheelchairs, walkers, the use of special transportation or the assistance of another person to leave their place of residence. There is a normal inability to leave the home and doing so requires considerable and taxing effort. Other absences are for medical reasons / religious services and are infrequent or of short duration when for other reasons). o If current  dressing causes regression in wound condition, may D/C ordered dressing product/s and apply Normal Saline Moist Dressing daily until next [place] / Other MD appointment. [place] of regression in wound condition at [phone]. o Please direct any NON-WOUND related issues/requests for orders to patient's Primary Care Physician Electronic Signature(s) Signed: 05/21/2015 4:13:39 PM By: Christin Fudge MD, FACS Signed: 05/21/2015 5:06:15 PM By: Montey Hora Entered By: Montey Hora on 05/21/2015 13:19:25 [name] [name] ([id number]) -------------------------------------------------------------------------------- Problem List Details Patient Name: [name] [name] Date of Service: 05/21/2015 12:45 PM Medical Record Number: [id number] Patient Account Number: [id number] Date of Birth/Sex: 04-Apr-1927 (82 y.o. Male) Treating RN: Montey Hora Primary Care Physician: Alvester Chou Other Clinician: Referring Physician: Alvester Chou Treating Physician/Extender: Frann Rider in Treatment: 6 Active Problems ICD-10 Encounter Code Description Active Date Diagnosis E11.622 Type 2 diabetes mellitus with other skin ulcer 04/09/2015 Yes I89.0 Lymphedema, not elsewhere classified 04/09/2015 Yes I70.238 Atherosclerosis of native arteries of right leg with 04/09/2015 Yes ulceration of other part of lower right leg I70.248 Atherosclerosis of native arteries of left leg with ulceration 04/09/2015 Yes of other part of lower left leg I87.331 Chronic venous hypertension (idiopathic) with ulcer and 04/09/2015 Yes inflammation of right lower extremity I87.332 Chronic venous hypertension (idiopathic) with ulcer and 04/09/2015 Yes inflammation of left lower extremity Inactive Problems Resolved Problems Electronic Signature(s) Signed: 05/21/2015 1:14:07 PM By: Christin Fudge MD, FACS Entered By: Christin Fudge on 05/21/2015 13:14:07 [name] [name]  ([id number]) -------------------------------------------------------------------------------- Progress Note Details Patient Name: [name] [name] Date of Service: 05/21/2015 12:45 PM Medical Record Number: [id number] Patient Account Number: [id number] Date of Birth/Sex: Jul 24, 1926 (82 y.o. Male) Treating RN: Montey Hora Primary Care Physician: Alvester Chou Other Clinician: Referring Physician: Alvester Chou Treating Physician/Extender: Frann Rider in Treatment: 6 Subjective Chief Complaint Information obtained from Patient Patient presents to the wound care center for a consult due non healing wound both lower extremities. The patient is a poor historian and [name] [name] seen here once in July 2016. He says home health [name] [name] doing his dressings and he [name] [name] healed but now has recurrence of the problem. Problem has been there at least for 6 months. History of Present Illness (HPI) The following HPI elements were documented  for the patient's wound: Location: swelling of the legs right more than left an ulceration both lower extremities Quality: Patient reports experiencing a dull pain to affected area(s). Severity: Patient states wound (s) are getting worse Duration: Patient has had the wound for > 6 months prior to seeking treatment at the wound center Timing: Pain in wound is Intermittent. Context: The wound appeared gradually over time Modifying Factors: he has been having home health come and apply his dressing daily [name] [name] healed. Associated Signs and Symptoms: Patient reports having difficulty standing for long periods. Carlus Ullery is a 82 y.o. male with history of DM, hypertension, chronic kidney disease, gout, diastolic dysfunction and the patient [name] [name] having increasing swelling with increasing fluid draining from the legs and pain over the last 6 weeks. He says his wounds are completely healed and the home health had stopped coming to do his dressings. Recent hemoglobin A1c [name] [name] 6.6. During  the last admission A venous duplex study revealed no evidence of deep vein thrombosis or superficial thrombosis involving the right and left lower extremity. [name] [name] seen in January 2016 by Dr. Adele Barthel at [facility]: Non-Invasive Vascular Imaging ABI (05/05/2014)  R: 0.65 (0.73), DP: mono, PT: mono, TBI: 0.57  L: 0.65 (0.69), DP: mono, PT: mono, TBI: 0.36 The patient had the diagnosis of bilateral lower extremity moderate peripheral atherosclerotic disease, bilateral lower extremity chronic venous insufficiency. Due to the patient's age and mental status conservative therapy [name] [name] only discussed and no surgical intervention [name] [name] planned. The patient has significant dementia and has a caregiver who spends a few hours every day coming to see him. He does not have any family members. 05/21/2015 -- lower extremity arterial studies done on 05/17/2015 show the right ABI is 0.50-0.79 indicating moderate arterial occlusive disease at rest. The left ABI [name] [name] 0.5-0.79 indicating moderate arterial occlusive disease at rest. The left posterior tibial and peroneal arteries were not evaluated due to local wound [name] [name], [name] [name] ([id number]) condition. The right TBI [name] [name] less than 0.69 and the left ABI [name] [name] less than 0.69 which are both abnormal. Since it's been a year since the previous opinion from Dr. Bridgett Larsson I have asked him to see him for a review. Objective Constitutional Pulse regular. Respirations normal and unlabored. Afebrile. Vitals Time Taken: 12:50 PM, Height: 70 in, Weight: 230 lbs, BMI: 33, Pulse: 71 bpm, Respiratory Rate: 20 breaths/min, Blood Pressure: 152/100 mmHg. Eyes Nonicteric. Reactive to light. Ears, Nose, Mouth, and Throat Lips, teeth, and gums WNL.Marland Kitchen Moist mucosa without lesions. Neck supple and nontender. No palpable supraclavicular or cervical adenopathy. Normal sized without goiter. Respiratory WNL. No retractions.. Cardiovascular Pedal Pulses WNL. No clubbing, cyanosis or  edema. Gastrointestinal (GI) Abdomen without masses or tenderness.. No liver or spleen enlargement or tenderness.. Lymphatic No adneopathy. No adenopathy. No adenopathy. Musculoskeletal Adexa without tenderness or enlargement.. Digits and nails w/o clubbing, cyanosis, infection, petechiae, ischemia, or inflammatory conditions.Marland Kitchen Psychiatric Judgement and insight Intact.. No evidence of depression, anxiety, or agitation.. General Notes: The wounds continue to get better and his lymphedema is looking good. He has been tolerating and Unna's boots compression with no adverse effects. [name] [name], [name] [name] ([id number]) Integumentary (Hair, Skin) No suspicious lesions. No crepitus or fluctuance. No peri-wound warmth or erythema. No masses.. Wound #1 status is Open. Original cause of wound [name] [name] Gradually Appeared. The wound is located on the Right,Circumferential Lower Leg. The wound measures 2cm length x 3cm width x 0.2cm depth; 4.712cm^2 area and 0.942cm^3 volume. The wound is limited to skin breakdown. There is  no tunneling or undermining noted. There is a medium amount of serosanguineous drainage noted. The wound margin is indistinct and nonvisible. There is large (67-100%) pink, pale granulation within the wound bed. There is a small (1-33%) amount of necrotic tissue within the wound bed including Adherent Slough. The periwound skin appearance exhibited: Maceration, Moist. The periwound skin appearance did not exhibit: Callus, Crepitus, Excoriation, Fluctuance, Friable, Induration, Localized Edema, Rash, Scarring, Dry/Scaly, Atrophie Blanche, Cyanosis, Ecchymosis, Hemosiderin Staining, Mottled, Pallor, Rubor, Erythema. Periwound temperature [name] [name] noted as No Abnormality. Wound #2 status is Open. Original cause of wound [name] [name] Gradually Appeared. The wound is located on the Left,Circumferential Lower Leg. The wound measures 11.5cm length x 23cm width x 0.1cm depth; 207.738cm^2 area and 20.774cm^3 volume. The  wound is limited to skin breakdown. There is no tunneling or undermining noted. There is a large amount of serosanguineous drainage noted. The wound margin is indistinct and nonvisible. There is large (67-100%) red, pink granulation within the wound bed. There is no necrotic tissue within the wound bed. The periwound skin appearance exhibited: Localized Edema, Maceration, Moist. The periwound skin appearance did not exhibit: Callus, Crepitus, Excoriation, Fluctuance, Friable, Induration, Rash, Scarring, Dry/Scaly, Atrophie Blanche, Cyanosis, Ecchymosis, Hemosiderin Staining, Mottled, Pallor, Rubor, Erythema. Periwound temperature [name] [name] noted as No Abnormality. Wound #3 status is Open. Original cause of wound [name] [name] Gradually Appeared. The wound is located on the [place]. The wound measures 1.8cm length x 3.5cm width x 0.1cm depth; 4.948cm^2 area and 0.495cm^3 volume. The wound is limited to skin breakdown. There is no tunneling or undermining noted. There is a medium amount of serous drainage noted. The wound margin is indistinct and nonvisible. There is large (67-100%) pink granulation within the wound bed. There is a small (1-33%) amount of necrotic tissue within the wound bed including Adherent Slough. The periwound skin appearance exhibited: Maceration, Moist. The periwound skin appearance did not exhibit: Callus, Crepitus, Excoriation, Fluctuance, Friable, Induration, Localized Edema, Rash, Scarring, Dry/Scaly, Atrophie Blanche, Cyanosis, Ecchymosis, Hemosiderin Staining, Mottled, Pallor, Rubor, Erythema. Periwound temperature [name] [name] noted as No Abnormality. 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 I70.248 - Atherosclerosis of native arteries of left leg with ulceration of other part of  lower left leg I87.331 - Chronic venous hypertension (idiopathic) with ulcer and inflammation of right lower extremity I87.332 - Chronic venous hypertension (idiopathic) with ulcer and inflammation of left lower extremity [name] [name], [name] [name] ([id number]) I have recommended silver alginate and Unna's boots to be applied and changed twice weekly. I have also recommended elevation and exercise and will order him juzo compression stockings with the Velcro wrap. In view of his recent arterial Doppler studies I have recommended a review by his vascular surgeon. He will come back and see [place] next week. Plan Wound Cleansing: Wound #1 Right,Circumferential Lower Leg: Cleanse wound with mild soap and water May Shower, gently pat wound dry prior to applying new dressing. May shower with protection. No tub bath. Wound #2 Left,Circumferential Lower Leg: Cleanse wound with mild soap and water May Shower, gently pat wound dry prior to applying new dressing. May shower with protection. No tub bath. Wound #3 Left,Plantar Foot: Cleanse wound with mild soap and water May Shower, gently pat wound dry prior to applying new dressing. May shower with protection. No tub bath. Primary Wound Dressing: Wound #1 Right,Circumferential Lower  Leg: Aquacel Ag Wound #2 Left,Circumferential Lower Leg: Aquacel Ag Wound #3 Left,Plantar Foot: Aquacel Ag Secondary Dressing: Wound #1 Right,Circumferential Lower Leg: ABD pad Wound #2 Left,Circumferential Lower Leg: ABD pad - and xtrasorb if needed Wound #3 Left,Plantar Foot: ABD pad [name] [name], [name] [name] ([id number]) Dressing Change Frequency: Wound #1 Right,Circumferential Lower Leg: Three times weekly Wound #2 Left,Circumferential Lower Leg: Three times weekly Wound #3 Left,Plantar Foot: Three times weekly Follow-up Appointments: Wound #1 Right,Circumferential Lower Leg: Return Appointment in 1 week. Wound #2 Left,Circumferential Lower Leg: Return Appointment in 1  week. Wound #3 Left,Plantar Foot: Return Appointment in 1 week. Edema Control: Wound #1 Right,Circumferential Lower Leg: Unna Boots Bilaterally Wound #2 Left,Circumferential Lower Leg: Retail buyer Bilaterally Wound #3 Left,Plantar Foot: Retail buyer Bilaterally Home Health: Wound #1 Right,Circumferential Lower Leg: Continue Home Health Visits - [place] please order Juzo compression wraps for patient. Measurements as follows: L ankle - 27cm, calf - 38cm; R ankle - 28cm, 37cm; heel to knee length 47cm Home Health Nurse may visit PRN to address patient s wound care needs. FACE TO FACE ENCOUNTER: MEDICARE and MEDICAID PATIENTS: I certify that this patient is under my care and that I had a face-to-face encounter that meets the physician face-to-face encounter requirements with this patient on this date. The encounter with the patient [name] [name] in whole or in part for the following MEDICAL CONDITION: (primary reason for [place]) MEDICAL NECESSITY: I certify, that based on my findings, NURSING services are a medically necessary home health service. HOME BOUND STATUS: I certify that my clinical findings support that this patient is homebound (i.e., Due to illness or injury, pt requires aid of supportive devices such as crutches, cane, wheelchairs, walkers, the use of special transportation or the assistance of another person to leave their place of residence. There is a normal inability to leave the home and doing so requires considerable and taxing effort. Other absences are for medical reasons / religious services and are infrequent or of short duration when for other reasons). If current dressing causes regression in wound condition, may D/C ordered dressing product/s and apply Normal Saline Moist Dressing daily until next [place] / Other MD appointment. [place] of regression in wound condition at [phone]. Please direct any NON-WOUND related  issues/requests for orders to patient's Primary Care Physician Wound #2 Left,Circumferential Lower Leg: [place] please order Juzo compression wraps for patient. Measurements as follows: L ankle - 27cm, calf - 38cm; R ankle - 28cm, 37cm; heel to knee length 47cm Home Health Nurse may visit PRN to address patient s wound care needs. FACE TO FACE ENCOUNTER: MEDICARE and MEDICAID PATIENTS: I certify that this patient is under my care and that I had a face-to-face encounter that meets the physician face-to-face encounter requirements with this patient on this date. The encounter with the patient [name] [name] in whole or in part for the following MEDICAL CONDITION: (primary reason for [place]) MEDICAL NECESSITY: I certify, that based on my findings, NURSING services are a medically necessary home health service. HOME BOUND STATUS: I certify that my clinical findings support that this patient is homebound (i.e., Due to illness or injury, pt requires aid of supportive devices such as crutches, cane, wheelchairs, walkers, the use [name] [name], [name] [name] ([id number]) of special transportation or the assistance of another person to leave their place of residence. There is a normal inability to leave the home and doing so requires considerable and taxing effort.  Other absences are for medical reasons / religious services and are infrequent or of short duration when for other reasons). If current dressing causes regression in wound condition, may D/C ordered dressing product/s and apply Normal Saline Moist Dressing daily until next [place] / Other MD appointment. [place] of regression in wound condition at [phone]. Please direct any NON-WOUND related issues/requests for orders to patient's Primary Care Physician Wound #3 Left,Plantar Foot: [place] please order Juzo compression wraps for patient. Measurements  as follows: L ankle - 27cm, calf - 38cm; R ankle - 28cm, 37cm; heel to knee length 47cm Home Health Nurse may visit PRN to address patient s wound care needs. FACE TO FACE ENCOUNTER: MEDICARE and MEDICAID PATIENTS: I certify that this patient is under my care and that I had a face-to-face encounter that meets the physician face-to-face encounter requirements with this patient on this date. The encounter with the patient [name] [name] in whole or in part for the following MEDICAL CONDITION: (primary reason for [place]) MEDICAL NECESSITY: I certify, that based on my findings, NURSING services are a medically necessary home health service. HOME BOUND STATUS: I certify that my clinical findings support that this patient is homebound (i.e., Due to illness or injury, pt requires aid of supportive devices such as crutches, cane, wheelchairs, walkers, the use of special transportation or the assistance of another person to leave their place of residence. There is a normal inability to leave the home and doing so requires considerable and taxing effort. Other absences are for medical reasons / religious services and are infrequent or of short duration when for other reasons). If current dressing causes regression in wound condition, may D/C ordered dressing product/s and apply Normal Saline Moist Dressing daily until next [place] / Other MD appointment. [place] of regression in wound condition at [phone]. Please direct any NON-WOUND related issues/requests for orders to patient's Primary Care Physician I have recommended silver alginate and Unna's boots to be applied and changed twice weekly. I have also recommended elevation and exercise and will order him juzo compression stockings with the Velcro wrap. In view of his recent arterial Doppler studies I have recommended a review by his vascular surgeon. He will come back and see [place] next week. Electronic  Signature(s) Signed: 05/22/2015 2:36:48 PM By: Christin Fudge MD, FACS Previous Signature: 05/22/2015 2:34:41 PM Version By: Christin Fudge MD, FACS Previous Signature: 05/21/2015 1:23:56 PM Version By: Christin Fudge MD, FACS Entered By: Christin Fudge on 05/22/2015 14:36:48 [name] [name] ([id number]) -------------------------------------------------------------------------------- SuperBill Details Patient Name: [name] [name] Date of Service: 05/21/2015 Medical Record Number: [id number] Patient Account Number: [id number] Date of Birth/Sex: 1927/02/06 (82 y.o. 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 left leg Chronic venous hypertension (idiopathic) with ulcer and inflammation of right lower I87.331 extremity Chronic venous hypertension (idiopathic) with ulcer and inflammation of left lower I87.332 extremity Facility Procedures CPT4 Code: [id number] Description: [id number] - APPLY UNNA BOOT/PROFO BILATERAL Modifier: Quantity: 1 Physician Procedures CPT4: Description Modifier Quantity Code [id number] - WC  PHYS LEVEL 3 - EST PT 1 ICD-10 Description Diagnosis E11.622 Type 2 diabetes mellitus with other skin ulcer I89.0 Lymphedema, not elsewhere classified I70.238 Atherosclerosis of native arteries  of right leg with ulceration of other part of lower right leg I70.248 Atherosclerosis of native arteries of left leg with ulceration of other part of lower left leg Electronic Signature(s) Signed: 05/21/2015 2:16:59 PM By:  Montey Hora Signed: 05/21/2015 4:13:39 PM By: Christin Fudge MD, FACS Previous Signature: 05/21/2015 1:24:17 PM Version By: Christin Fudge MD, FACS Entered By: Montey Hora on 05/21/2015 14:16:59

## 2015-05-22 NOTE — Progress Notes (Signed)
[name], [name] ([id number]) Visit Report for 05/21/2015 Arrival Information Details Patient Name: [name] [name], [name] [name] Date of Service: 05/21/2015 12:45 PM Medical Record Number: [id number] Patient Account Number: [id number] Date of Birth/Sex: 08-27-1926 (82 y.o. Male) Treating RN: Montey Hora Primary Care Physician: Alvester Chou Other Clinician: Referring Physician: Alvester Chou Treating Physician/Extender: Frann Rider in Treatment: 6 Visit Information History Since Last Visit Added or deleted any medications: No Patient Arrived: Walker Any new allergies or adverse reactions: No Arrival Time: 12:49 Had a fall or experienced change in No Accompanied By: cg activities of daily living that may affect Transfer Assistance: None risk of falls: Patient Identification Verified: Yes Signs or symptoms of abuse/neglect since last No Secondary Verification Process Yes visito Completed: Hospitalized since last visit: No Patient Has Alerts: Yes Pain Present Now: No Patient Alerts: DMII AIB [place] BILATERAL >220 Electronic Signature(s) Signed: 05/21/2015 5:06:15 PM By: Montey Hora Entered By: Montey Hora on 05/21/2015 12:49:41 [name] [name] ([id number]) -------------------------------------------------------------------------------- Encounter Discharge Information Details Patient Name: [name] [name] Date of Service: 05/21/2015 12:45 PM Medical Record Number: [id number] Patient Account Number: [id number] Date of Birth/Sex: [birth year] (82 y.o. Male) Treating RN: Montey Hora Primary Care Physician: Alvester Chou Other Clinician: Referring Physician: Alvester Chou Treating Physician/Extender: Frann Rider in Treatment: 6 Encounter Discharge Information Items Discharge Pain Level: 0 Discharge Condition: Stable Ambulatory Status: Walker Discharge Destination: Home Transportation: Private Auto Accompanied By: Mady Gemma Schedule Follow-up Appointment: Yes Medication Reconciliation  completed No and provided to Patient/Care Katalia Choma: Provided on Clinical Summary of Care: 05/21/2015 Form Type Recipient Paper Patient [facility] Electronic Signature(s) Signed: 05/21/2015 2:18:04 PM By: Montey Hora Previous Signature: 05/21/2015 1:43:52 PM Version By: Ruthine Dose Entered By: Montey Hora on 05/21/2015 14:18:04 [name] [name] ([id number]) -------------------------------------------------------------------------------- Lower Extremity Assessment Details Patient Name: [name] [name] Date of Service: 05/21/2015 12:45 PM Medical Record Number: [id number] Patient Account Number: [id number] Date of Birth/Sex: [birth year] (82 y.o. Male) Treating RN: Montey Hora Primary Care Physician: Alvester Chou Other Clinician: Referring Physician: Alvester Chou Treating Physician/Extender: Frann Rider in Treatment: 6 Edema Assessment Assessed: [Left: No] [Right: No] Edema: [Left: Yes] [Right: Yes] Calf Left: Right: Point of Measurement: 35 cm From Medial Instep cm cm Ankle Left: Right: Point of Measurement: 11 cm From Medial Instep cm cm Vascular Assessment Pulses: Posterior Tibial Extremity colors, hair growth, and conditions: Extremity Color: [Left:Hyperpigmented] [Right:Hyperpigmented] Hair Growth on Extremity: [Left:No] [Right:No] Temperature of Extremity: [Left:Warm] [Right:Warm] Capillary Refill: [Left:< 3 seconds] [Right:< 3 seconds] Electronic Signature(s) Signed: 05/21/2015 5:06:15 PM By: Montey Hora Entered By: Montey Hora on 05/21/2015 13:17:47 [name] [name] ([id number]) -------------------------------------------------------------------------------- Multi Wound Chart Details Patient Name: [name] [name] Date of Service: 05/21/2015 12:45 PM Medical Record Number: [id number] Patient Account Number: [id number] Date of Birth/Sex: 12/26/1926 (82 y.o. Male) Treating RN: Montey Hora Primary Care Physician: Alvester Chou Other Clinician: Referring Physician: Alvester Chou Treating Physician/Extender: Frann Rider in Treatment: 6 Vital Signs Height(in): 70 Pulse(bpm): 71 Weight(lbs): 230 Blood Pressure 152/100 (mmHg): Body Mass Index(BMI): 33 Temperature(F): Respiratory Rate 20 (breaths/min): Photos: [1:No Photos] [2:No Photos] [3:No Photos] Wound Location: [1:Right Lower Leg - Circumfernential] [2:Left Lower Leg - Circumfernential] [3:Left Foot - Plantar] Wounding Event: [1:Gradually Appeared] [2:Gradually Appeared] [3:Gradually Appeared] Primary Etiology: [1:Lymphedema] [2:Lymphedema] [3:Lymphedema] Comorbid History: [1:Congestive Heart Failure, Hypertension, Type II Diabetes, Neuropathy] [2:Congestive Heart Failure, Hypertension, Type II Diabetes, Neuropathy] [3:Congestive Heart Failure, Hypertension, Type II Diabetes, Neuropathy] Date Acquired: [1:12/28/2014] [2:12/28/2014] [3:01/27/2015] Weeks of Treatment: [1:6] [2:6] [3:6] Wound Status: [1:Open] [2:Open] [3:Open] Clustered Wound: [1:Yes] [2:Yes] [3:No] Measurements L x  W x D 2x3x0.2 [2:11.5x23x0.1] [3:1.8x3.5x0.1] (cm) Area (cm) : [1:4.712] [2:207.738] [3:4.948] Volume (cm) : [1:0.942] [id number] [3:0.495] % Reduction in Area: [1:98.80%] [2:21.30%] [3:34.40%] % Reduction in Volume: 97.60% [2:21.30%] [3:34.40%] Classification: [1:Partial Thickness] [2:Partial Thickness] [3:Partial Thickness] HBO Classification: [1:Grade 0] [2:Grade 1] [3:Grade 0] Exudate Amount: [1:Medium] [2:Large] [3:Medium] Exudate Type: [1:Serosanguineous] [2:Serosanguineous] [3:Serous] Exudate Color: [1:red, brown] [2:red, brown] [3:amber] Foul Odor After [1:No] [2:Yes] [3:No] Cleansing: Odor Anticipated Due to N/A [2:No] [3:N/A] Product Use: Wound Margin: [1:Indistinct, nonvisible] [2:Indistinct, nonvisible] [3:Indistinct, nonvisible] Granulation Amount: [1:Large (67-100%)] [2:Large (67-100%)] [3:Large (67-100%)] Granulation Quality: [1:Pink, Pale] [3:Pink] Red, Pink, Hyper- granulation Necrotic  Amount: Small (1-33%) None Present (0%) Small (1-33%) Exposed Structures: Fascia: No Fascia: No Fascia: No Fat: No Fat: No Fat: No Tendon: No Tendon: No Tendon: No Muscle: No Muscle: No Muscle: No Joint: No Joint: No Joint: No Bone: No Bone: No Bone: No Limited to Skin Limited to Skin Limited to Skin Breakdown Breakdown Breakdown Epithelialization: None None Small (1-33%) Periwound Skin Texture: Edema: No Edema: Yes Edema: No Excoriation: No Excoriation: No Excoriation: No Induration: No Induration: No Induration: No Callus: No Callus: No Callus: No Crepitus: No Crepitus: No Crepitus: No Fluctuance: No Fluctuance: No Fluctuance: No Friable: No Friable: No Friable: No Rash: No Rash: No Rash: No Scarring: No Scarring: No Scarring: No Periwound Skin Maceration: Yes Maceration: Yes Maceration: Yes Moisture: Moist: Yes Moist: Yes Moist: Yes Dry/Scaly: No Dry/Scaly: No Dry/Scaly: No Periwound Skin Color: Atrophie Blanche: No Atrophie Blanche: No Atrophie Blanche: No Cyanosis: No Cyanosis: No Cyanosis: No Ecchymosis: No Ecchymosis: No Ecchymosis: No Erythema: No Erythema: No Erythema: No Hemosiderin Staining: No Hemosiderin Staining: No Hemosiderin Staining: No Mottled: No Mottled: No Mottled: No Pallor: No Pallor: No Pallor: No Rubor: No Rubor: No Rubor: No Temperature: No Abnormality No Abnormality No Abnormality Tenderness on No No No Palpation: Wound Preparation: Ulcer Cleansing: Other: Ulcer Cleansing: Other: Ulcer Cleansing: Other: soap AND WATER soap and water water and soap Topical Anesthetic Topical Anesthetic Applied: None Applied: None Treatment Notes Electronic Signature(s) Signed: 05/21/2015 5:06:15 PM By: Montey Hora Entered By: Montey Hora on 05/21/2015 13:18:10 [name] [name] ([id number]) -------------------------------------------------------------------------------- [place] Details Patient  Name: [name] [name] Date of Service: 05/21/2015 12:45 PM Medical Record Number: [id number] Patient Account Number: [id number] Date of Birth/Sex: Jan 03, 1927 (82 y.o. Male) Treating RN: Montey Hora Primary Care Physician: Alvester Chou Other Clinician: Referring Physician: Alvester Chou Treating Physician/Extender: Frann Rider in Treatment: 6 Active Inactive Abuse / Safety / Falls / Self Care Management Nursing Diagnoses: Impaired physical mobility Potential for falls Goals: Patient will remain injury free Date Initiated: 04/09/2015 Goal Status: Active Interventions: Assess fall risk on admission and as needed Notes: Orientation to the Wound Care Program Nursing Diagnoses: Knowledge deficit related to the wound healing center program Goals: Patient/caregiver will verbalize understanding of the [place] Program Date Initiated: 04/09/2015 Goal Status: Active Interventions: Provide education on orientation to the wound center Notes: Venous Leg Ulcer Nursing Diagnoses: Actual venous Insuffiency (use after diagnosis is confirmed) Goals: Patient will maintain optimal edema control TANYA, RUNNING ([id number]) Date Initiated: 04/09/2015 Goal Status: Active Interventions: Assess peripheral edema status every visit. Provide education on venous insufficiency Notes: Wound/Skin Impairment Nursing Diagnoses: Impaired tissue integrity Goals: Patient/caregiver will verbalize understanding of skin care regimen Date Initiated: 04/09/2015 Goal Status: Active Ulcer/skin breakdown will have a volume reduction of 30% by week 4 Date Initiated: 04/09/2015 Goal Status: Active Ulcer/skin breakdown will have a volume reduction of 50% by week  8 Date Initiated: 04/09/2015 Goal Status: Active Ulcer/skin breakdown will have a volume reduction of 80% by week 12 Date Initiated: 04/09/2015 Goal Status: Active Ulcer/skin breakdown will heal within 14 weeks Date Initiated:  04/09/2015 Goal Status: Active Interventions: Assess ulceration(s) every visit Notes: Electronic Signature(s) Signed: 05/21/2015 5:06:15 PM By: Montey Hora Entered By: Montey Hora on 05/21/2015 13:18:02 [name] [name] ([id number]) -------------------------------------------------------------------------------- Patient/Caregiver Education Details Patient Name: [name] [name] Date of Service: 05/21/2015 12:45 PM Medical Record Number: [id number] Patient Account Number: [id number] Date of Birth/Gender: [birth year] (82 y.o. Male) Treating RN: Montey Hora Primary Care Physician: Alvester Chou Other Clinician: Referring Physician: Alvester Chou Treating Physician/Extender: Frann Rider in Treatment: 6 Education Assessment Education Provided To: Patient Education Topics Provided Venous: Handouts: Other: elevate legs and pump ankles Methods: Explain/Verbal Responses: State content correctly Electronic Signature(s) Signed: 05/21/2015 2:18:30 PM By: Montey Hora Entered By: Montey Hora on 05/21/2015 14:18:30 [name] [name] ([id number]) -------------------------------------------------------------------------------- Wound Assessment Details Patient Name: [name] [name] Date of Service: 05/21/2015 12:45 PM Medical Record Number: [id number] Patient Account Number: [id number] Date of Birth/Sex: Feb 21, 1927 (82 y.o. Male) Treating RN: Montey Hora Primary Care Physician: Alvester Chou Other Clinician: Referring Physician: Alvester Chou Treating Physician/Extender: Frann Rider in Treatment: 6 Wound Status Wound Number: 1 Primary Lymphedema Etiology: Wound Location: Right Lower Leg - Circumfernential Wound Open Status: Wounding Event: Gradually Appeared Comorbid Congestive Heart Failure, Date Acquired: 12/28/2014 History: Hypertension, Type II Diabetes, Weeks Of Treatment: 6 Neuropathy Clustered Wound: Yes Photos Photo Uploaded By: Montey Hora on 05/21/2015  16:16:05 Wound Measurements Length: (cm) 2 Width: (cm) 3 Depth: (cm) 0.2 Area: (cm) 4.712 Volume: (cm) 0.942 % Reduction in Area: 98.8% % Reduction in Volume: 97.6% Epithelialization: None Tunneling: No Undermining: No Wound Description Classification: Partial Thickness Diabetic Severity Earleen Newport): Grade 0 Wound Margin: Indistinct, nonvisible Exudate Amount: Medium Exudate Type: Serosanguineous Exudate Color: red, brown Foul Odor After Cleansing: No Wound Bed Granulation Amount: Large (67-100%) Exposed Structure Granulation Quality: Pink, Pale Fascia Exposed: No Huebert, Danzel ([id number]) Necrotic Amount: Small (1-33%) Fat Layer Exposed: No Necrotic Quality: Adherent Slough Tendon Exposed: No Muscle Exposed: No Joint Exposed: No Bone Exposed: No Limited to Skin Breakdown Periwound Skin Texture Texture Color No Abnormalities Noted: No No Abnormalities Noted: No Callus: No Atrophie Blanche: No Crepitus: No Cyanosis: No Excoriation: No Ecchymosis: No Fluctuance: No Erythema: No Friable: No Hemosiderin Staining: No Induration: No Mottled: No Localized Edema: No Pallor: No Rash: No Rubor: No Scarring: No Temperature / Pain Moisture Temperature: No Abnormality No Abnormalities Noted: No Dry / Scaly: No Maceration: Yes Moist: Yes Wound Preparation Ulcer Cleansing: Other: soap AND WATER, Topical Anesthetic Applied: None Treatment Notes Wound #1 (Right, Circumferential Lower Leg) 1. Cleansed with: Cleanse wound with antibacterial soap and water 4. Dressing Applied: Aquacel Ag Other dressing (specify in notes) 5. Secondary Dressing Applied ABD Pad 7. Secured with [employer] Bilaterally Notes xtrasorb Engineer, maintenance) Signed: 05/21/2015 5:06:15 PM By: Montey Hora Entered By: Montey Hora on 05/21/2015 13:16:19 HAIRO, DOOLY ([id number]) BRYSTON, ROBARE  ([id number]) -------------------------------------------------------------------------------- Wound Assessment Details Patient Name: [name] [name] Date of Service: 05/21/2015 12:45 PM Medical Record Number: [id number] Patient Account Number: [id number] Date of Birth/Sex: 1926-07-19 (82 y.o. Male) Treating RN: Montey Hora Primary Care Physician: Alvester Chou Other Clinician: Referring Physician: Alvester Chou Treating Physician/Extender: Frann Rider in Treatment: 6 Wound Status Wound Number: 2 Primary Lymphedema Etiology: Wound Location: Left Lower Leg - Circumfernential Wound Open Status: Wounding Event: Gradually Appeared Comorbid Congestive Heart Failure, Date Acquired: 12/28/2014 History: Hypertension, Type  II Diabetes, Weeks Of Treatment: 6 Neuropathy Clustered Wound: Yes Photos Wound Measurements Length: (cm) 11.5 Width: (cm) 23 Depth: (cm) 0.1 Area: (cm) 207.738 Volume: (cm) 20.774 % Reduction in Area: 21.3% % Reduction in Volume: 21.3% Epithelialization: None Tunneling: No Undermining: No Wound Description Classification: Partial Thickness Diabetic Severity (Wagner): Grade 1 Wound Margin: Indistinct, nonvisible Exudate Amount: Large Exudate Type: Serosanguineous Exudate Color: red, brown Foul Odor After Cleansing: Yes Due to Product Use: No Wound Bed Granulation Amount: Large (67-100%) Exposed Structure Granulation Quality: Red, Pink, Hyper-granulation Fascia Exposed: No Necrotic Amount: None Present (0%) Fat Layer Exposed: No Piascik, Ryken ([id number]) Tendon Exposed: No Muscle Exposed: No Joint Exposed: No Bone Exposed: No Limited to Skin Breakdown Periwound Skin Texture Texture Color No Abnormalities Noted: No No Abnormalities Noted: No Callus: No Atrophie Blanche: No Crepitus: No Cyanosis: No Excoriation: No Ecchymosis: No Fluctuance: No Erythema: No Friable: No Hemosiderin Staining: No Induration: No Mottled: No Localized Edema:  Yes Pallor: No Rash: No Rubor: No Scarring: No Temperature / Pain Moisture Temperature: No Abnormality No Abnormalities Noted: No Dry / Scaly: No Maceration: Yes Moist: Yes Wound Preparation Ulcer Cleansing: Other: soap and water, Topical Anesthetic Applied: None Treatment Notes Wound #2 (Left, Circumferential Lower Leg) 1. Cleansed with: Cleanse wound with antibacterial soap and water 4. Dressing Applied: Aquacel Ag Other dressing (specify in notes) 5. Secondary Dressing Applied ABD Pad 7. Secured with [employer] Bilaterally Notes xtrasorb Engineer, maintenance) Signed: 05/21/2015 4:16:57 PM By: Montey Hora Entered By: Montey Hora on 05/21/2015 16:16:57 Lawlor, [name] ([id number]) VERDELL, MACGREGOR ([id number]) -------------------------------------------------------------------------------- Wound Assessment Details Patient Name: [name] [name] Date of Service: 05/21/2015 12:45 PM Medical Record Number: [id number] Patient Account Number: [id number] Date of Birth/Sex: [birth year] (82 y.o. Male) Treating RN: Montey Hora Primary Care Physician: Alvester Chou Other Clinician: Referring Physician: Alvester Chou Treating Physician/Extender: Frann Rider in Treatment: 6 Wound Status Wound Number: 3 Primary Lymphedema Etiology: Wound Location: Left Foot - Plantar Wound Open Wounding Event: Gradually Appeared Status: Date Acquired: 01/27/2015 Comorbid Congestive Heart Failure, Weeks Of Treatment: 6 History: Hypertension, Type II Diabetes, Clustered Wound: No Neuropathy Photos Photo Uploaded By: Montey Hora on 05/21/2015 16:16:06 Wound Measurements Length: (cm) 1.8 Width: (cm) 3.5 Depth: (cm) 0.1 Area: (cm) 4.948 Volume: (cm) 0.495 % Reduction in Area: 34.4% % Reduction in Volume: 34.4% Epithelialization: Small (1-33%) Tunneling: No Undermining: No Wound Description Classification: Partial Thickness Diabetic Severity Earleen Newport): Grade 0 Wound Margin:  Indistinct, nonvisible Exudate Amount: Medium Exudate Type: Serous Exudate Color: amber Foul Odor After Cleansing: No Wound Bed Granulation Amount: Large (67-100%) Exposed Structure Granulation Quality: Pink Fascia Exposed: No Necrotic Amount: Small (1-33%) Fat Layer Exposed: No Claybrook, Timohty ([id number]) Necrotic Quality: Adherent Slough Tendon Exposed: No Muscle Exposed: No Joint Exposed: No Bone Exposed: No Limited to Skin Breakdown Periwound Skin Texture Texture Color No Abnormalities Noted: No No Abnormalities Noted: No Callus: No Atrophie Blanche: No Crepitus: No Cyanosis: No Excoriation: No Ecchymosis: No Fluctuance: No Erythema: No Friable: No Hemosiderin Staining: No Induration: No Mottled: No Localized Edema: No Pallor: No Rash: No Rubor: No Scarring: No Temperature / Pain Moisture Temperature: No Abnormality No Abnormalities Noted: No Dry / Scaly: No Maceration: Yes Moist: Yes Wound Preparation Ulcer Cleansing: Other: water and soap, Treatment Notes Wound #3 (Left, Plantar Foot) 1. Cleansed with: Cleanse wound with antibacterial soap and water 4. Dressing Applied: Aquacel Ag 5. Secondary Dressing Applied Gauze and Kerlix/Conform Electronic Signature(s) Signed: 05/21/2015 5:06:15 PM By: Montey Hora Entered By: Montey Hora on 05/21/2015 13:16:47  NGHIA, MASSER ([id number]) -------------------------------------------------------------------------------- [place] Details Patient Name: [name] [name] Date of Service: 05/21/2015 12:45 PM Medical Record Number: [id number] Patient Account Number: [id number] Date of Birth/Sex: Sep 19, 1926 (82 y.o. Male) Treating RN: Montey Hora Primary Care Physician: Alvester Chou Other Clinician: Referring Physician: Alvester Chou Treating Physician/Extender: Frann Rider in Treatment: 6 Vital Signs Time Taken: 12:50 Pulse (bpm): 71 Height (in): 70 Respiratory Rate (breaths/min): 20 Weight (lbs): 230 Blood  Pressure (mmHg): 152/100 Body Mass Index (BMI): 33 Reference Range: 80 - 120 mg / dl Electronic Signature(s) Signed: 05/21/2015 5:06:15 PM By: Montey Hora Entered By: Montey Hora on 05/21/2015 12:52:15

## 2015-05-23 ENCOUNTER — Ambulatory Visit (INDEPENDENT_AMBULATORY_CARE_PROVIDER_SITE_OTHER): Payer: Medicare Other | Admitting: Vascular Surgery

## 2015-05-23 ENCOUNTER — Encounter: Payer: Self-pay | Admitting: Vascular Surgery

## 2015-05-23 VITALS — BP 112/63 | HR 90 | Temp 97.7°F | Resp 16 | Ht 70.0 in | Wt 232.0 lb

## 2015-05-23 DIAGNOSIS — I7025 Atherosclerosis of native arteries of other extremities with ulceration: Secondary | ICD-10-CM | POA: Diagnosis not present

## 2015-05-23 DIAGNOSIS — I83019 Varicose veins of right lower extremity with ulcer of unspecified site: Secondary | ICD-10-CM

## 2015-05-23 DIAGNOSIS — I83029 Varicose veins of left lower extremity with ulcer of unspecified site: Secondary | ICD-10-CM

## 2015-05-23 DIAGNOSIS — I83009 Varicose veins of unspecified lower extremity with ulcer of unspecified site: Secondary | ICD-10-CM | POA: Insufficient documentation

## 2015-05-23 DIAGNOSIS — I872 Venous insufficiency (chronic) (peripheral): Secondary | ICD-10-CM

## 2015-05-23 DIAGNOSIS — L97919 Non-pressure chronic ulcer of unspecified part of right lower leg with unspecified severity: Secondary | ICD-10-CM

## 2015-05-23 DIAGNOSIS — L97909 Non-pressure chronic ulcer of unspecified part of unspecified lower leg with unspecified severity: Secondary | ICD-10-CM

## 2015-05-23 DIAGNOSIS — L97929 Non-pressure chronic ulcer of unspecified part of left lower leg with unspecified severity: Secondary | ICD-10-CM

## 2015-05-23 NOTE — Progress Notes (Signed)
    Established Critical Limb Ischemia Patient  History of Present Illness  [name] [name] is a 82 y.o. 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  He had previously healed up but broke out with new ulcers in both legs recently.  He is being managed by [place].  He contines to deny any rest pain or intermittent claudication as his ambulation is limited. The patient's treatment regimen currently included: maximal medical management and compression with Coban.  The patient's PMH, PSH, SH, FamHx, Med, and Allergies are unchanged from 05/06/15  On ROS today: no fever or chills, no pus, +serous drainage from both feet   Physical Examination Filed Vitals:   05/23/15 1517  BP: 112/63  Pulse: 90  Temp: 97.7 F (36.5 C)  TempSrc: Oral  Resp: 16  Height: 5\' 10"  (1.778 m)  Weight: 232 lb (105.235 kg)  SpO2: 96%   Body mass index is 33.29 kg/(m^2).   General: A&O x 3, WDWN  Eyes: PERRLA, EOMI  Pulmonary: Sym exp, good air movt, CTAB, no rales, rhonchi, & wheezing  Cardiac: RRR, Nl S1, S2, no Murmurs, rubs or gallops  Vascular: Vessel Right Left  Radial Palpable Palpable  Brachial Palpable Palpable  Carotid Palpable, without bruit Palpable, without bruit  Aorta Not palpable N/A  Femoral Palpable Palpable  Popliteal Not palpable Not palpable  PT Not Palpable Not Palpable  DP Not Palpable Not Palpable   Gastrointestinal: soft, NTND, -G/R, - HSM, - masses, - CVAT B  Musculoskeletal: M/S 5/5 throughout , extensive skin changes associated with venous insufficiency, BLE LDS, psoriatic plaques on both knees, shallow VSU on L lateral ankle, L heel, and posterior R calf with serous drainage  Neurologic: Pain and light touch intact in extremities , Motor exam as listed above   Non-Invasive Vascular Imaging ABI  (05/05/2014)  R: 0.72 (0.65), DP: mono, PT: mono, TBI: 0.28  L: 0.61 (0.65), DP: mono, PT: not recorded, TBI: 0.29  Medical Decision Making  [name] [name] is a 82 y.o. (24-Mar-1927) male who presents with: BLE moderate PAD, BLE chronic venous insufficiency (C5)   Again patient's lilmited ambulation will limit any sx, so I doubt he will develop extensive intermittent claudication.   This patient's dementia again limits the extent of his understanding of his medical condition.  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  Based on the patient's vascular studies and examination, I have offered the patient: q6 month ABI.  Would continue with compressive therapy with wound care.  I discussed in depth with the patient the nature of atherosclerosis, and emphasized the importance of maximal medical management including strict control of blood pressure, blood glucose, and lipid levels, antiplatelet agents, obtaining regular exercise, and cessation of smoking.   The patient is aware that without maximal medical management the underlying atherosclerotic disease process will progress, limiting the benefit of any interventions.  I have never seen this patient with any family or care givers.  Ultimately, I suspect his dementia will limit his therapeutic options, so the simpler the regimen, the better.  Thank you for allowing [place] to participate in this patient's care.  Adele Barthel, MD, FACS [facility] Office: [phone] Pager: [phone]  05/23/2015, 5:02 PM

## 2015-05-28 ENCOUNTER — Encounter: Payer: Medicare Other | Admitting: Surgery

## 2015-05-28 DIAGNOSIS — E11622 Type 2 diabetes mellitus with other skin ulcer: Secondary | ICD-10-CM | POA: Diagnosis not present

## 2015-05-29 NOTE — Progress Notes (Signed)
[name] [name], [name] [name] ([id number]) Visit Report for 05/28/2015 Arrival Information Details Patient Name: [name] [name], [name] [name] Date of Service: 05/28/2015 12:45 PM Medical Record Number: [id number] Patient Account Number: [id number] Date of Birth/Sex: 1926-11-22 (82 y.o. Male) Treating RN: Macarthur Critchley Primary Care Physician: Alvester Chou Other Clinician: Referring Physician: Alvester Chou Treating Physician/Extender: Frann Rider in Treatment: 7 Visit Information History Since Last Visit All ordered tests and consults were completed: No Patient Arrived: [name] [name] Added or deleted any medications: No Arrival Time: 12:52 Any new allergies or adverse reactions: No Accompanied By: caregiver Had a fall or experienced change in No Transfer Assistance: None activities of daily living that may affect Patient Identification Verified: Yes risk of falls: Secondary Verification Process Yes Signs or symptoms of abuse/neglect since last No Completed: visito Patient Has Alerts: Yes Hospitalized since last visit: No Has Dressing in Place as Prescribed: Yes Has Compression in Place as Prescribed: Yes Pain Present Now: No Electronic Signature(s) Signed: 05/28/2015 4:43:04 PM By: Rebecca Eaton, RN, Sendra Entered By: Rebecca Eaton RN, Sendra on 05/28/2015 12:52:46 [name] [name] ([id number]) -------------------------------------------------------------------------------- Encounter Discharge Information Details Patient Name: [name] [name] Date of Service: 05/28/2015 12:45 PM Medical Record Number: [id number] Patient Account Number: [id number] Date of Birth/Sex: 10-Aug-1926 (82 y.o. Male) Treating RN: Macarthur Critchley Primary Care Physician: Alvester Chou Other Clinician: Referring Physician: Alvester Chou Treating Physician/Extender: Frann Rider in Treatment: 7 Encounter Discharge Information Items Facility Notification Discharge Pain Level: 0 Facility Type: Home Health Discharge Condition: Stable Orders Sent:  Yes Ambulatory Status: Walker Discharge Destination: Home Transportation: Private Auto Accompanied By: caregiver Schedule Follow-up Appointment: Yes Medication Reconciliation completed and provided to Patient/Care Yes Kila Godina: Provided on Clinical Summary of Care: 05/28/2015 Form Type Recipient Paper Patient [facility] Electronic Signature(s) Signed: 05/28/2015 4:43:04 PM By: Rebecca Eaton RN, Sendra Previous Signature: 05/28/2015 1:49:08 PM Version By: Ruthine Dose Entered By: Rebecca Eaton RN, Sendra on 05/28/2015 13:50:16 [name] [name] ([id number]) -------------------------------------------------------------------------------- Lower Extremity Assessment Details Patient Name: [name] [name] Date of Service: 05/28/2015 12:45 PM Medical Record Number: [id number] Patient Account Number: [id number] Date of Birth/Sex: 03-29-1927 (82 y.o. Male) Treating RN: Macarthur Critchley Primary Care Physician: Alvester Chou Other Clinician: Referring Physician: Alvester Chou Treating Physician/Extender: Frann Rider in Treatment: 7 Edema Assessment Assessed: [Left: No] [Right: No] E[Left: dema] [Right: :] Calf Left: Right: Point of Measurement: 35 cm From Medial Instep 37 cm 39 cm Ankle Left: Right: Point of Measurement: 11 cm From Medial Instep 26 cm 26.5 cm Electronic Signature(s) Signed: 05/28/2015 4:43:04 PM By: Rebecca Eaton, RN, Sendra Entered By: Rebecca Eaton RN, Sendra on 05/28/2015 13:26:46 [name] [name] ([id number]) -------------------------------------------------------------------------------- Pain Assessment Details Patient Name: [name] [name] Date of Service: 05/28/2015 12:45 PM Medical Record Number: [id number] Patient Account Number: [id number] Date of Birth/Sex: [birth year] (82 y.o. Male) Treating RN: Macarthur Critchley Primary Care Physician: Alvester Chou Other Clinician: Referring Physician: Alvester Chou Treating Physician/Extender: Frann Rider in Treatment: 7 Active Problems Location of  Pain Severity and Description of Pain Patient Has Paino No Site Locations Rate the pain. Current Pain Level: 0 Pain Management and Medication Current Pain Management: Electronic Signature(s) Signed: 05/28/2015 4:43:04 PM By: Rebecca Eaton, RN, Sendra Entered By: Rebecca Eaton RN, Sendra on 05/28/2015 12:53:20 [name] [name] ([id number]) -------------------------------------------------------------------------------- Patient/Caregiver Education Details Patient Name: [name] [name] Date of Service: 05/28/2015 12:45 PM Medical Record Number: [id number] Patient Account Number: [id number] Date of Birth/Gender: 15-Jun-1926 (82 y.o. Male) Treating RN: Macarthur Critchley Primary Care Physician: Alvester Chou Other Clinician: Referring Physician: Alvester Chou Treating Physician/Extender: Frann Rider in Treatment: 7 Education Assessment Education Provided To: Patient  and Caregiver Education Topics Provided Venous: Handouts: Other: una boot wraps Methods: Explain/Verbal Responses: State content correctly Wound/Skin Impairment: Handouts: Caring for Your Ulcer, Skin Care Do's and Dont's Methods: Explain/Verbal Responses: State content correctly Electronic Signature(s) Signed: 05/28/2015 4:43:04 PM By: Rebecca Eaton, RN, Sendra Entered By: Rebecca Eaton, RN, Sendra on 05/28/2015 13:22:22 [name] [name] ([id number]) -------------------------------------------------------------------------------- Wound Assessment Details Patient Name: [name] [name] Date of Service: 05/28/2015 12:45 PM Medical Record Number: [id number] Patient Account Number: [id number] Date of Birth/Sex: 1927-03-08 (82 y.o. Male) Treating RN: Macarthur Critchley Primary Care Physician: Alvester Chou Other Clinician: Referring Physician: Alvester Chou Treating Physician/Extender: Frann Rider in Treatment: 7 Wound Status Wound Number: 1 Primary Lymphedema Etiology: Wound Location: Right Lower Leg - Circumfernential Wound Open Status: Wounding  Event: Gradually Appeared Comorbid Congestive Heart Failure, Date Acquired: 12/28/2014 History: Hypertension, Type II Diabetes, Weeks Of Treatment: 7 Neuropathy Clustered Wound: Yes Photos Photo Uploaded By: Rebecca Eaton, RN, Roslynn Amble on 05/28/2015 16:29:16 Wound Measurements Length: (cm) 3.7 Width: (cm) 2.8 Depth: (cm) 0.1 Area: (cm) 8.137 Volume: (cm) 0.814 % Reduction in Area: 97.9% % Reduction in Volume: 97.9% Epithelialization: Medium (34-66%) Tunneling: No Undermining: No Wound Description Classification: Partial Thickness Diabetic Severity (Wagner): Grade 0 Wound Margin: Indistinct, nonvisible Exudate Amount: Medium Exudate Type: Serosanguineous Exudate Color: red, brown Foul Odor After Cleansing: No Wound Bed Granulation Amount: Large (67-100%) Exposed Structure Granulation Quality: Pink, Pale Fascia Exposed: No Voyles, Jayro ([id number]) Necrotic Amount: Small (1-33%) Fat Layer Exposed: No Necrotic Quality: Adherent Slough Tendon Exposed: No Muscle Exposed: No Joint Exposed: No Bone Exposed: No Limited to Skin Breakdown Periwound Skin Texture Texture Color No Abnormalities Noted: No No Abnormalities Noted: No Callus: No Atrophie Blanche: No Crepitus: No Cyanosis: No Excoriation: No Ecchymosis: No Fluctuance: No Erythema: No Friable: No Hemosiderin Staining: No Induration: No Mottled: No Localized Edema: No Pallor: No Rash: No Rubor: No Scarring: No Temperature / Pain Moisture Temperature: No Abnormality No Abnormalities Noted: No Dry / Scaly: No Maceration: Yes Moist: Yes Wound Preparation Ulcer Cleansing: Other: soap AND WATER, Topical Anesthetic Applied: None Treatment Notes Wound #1 (Right, Circumferential Lower Leg) 1. Cleansed with: Cleanse wound with antibacterial soap and water 4. Dressing Applied: Aquacel Ag 5. Secondary Dressing Applied ABD Pad Dry Gauze 7. Secured with Designer, jewellery) Signed: 05/28/2015 4:43:04 PM By: Rebecca Eaton, RN, Sendra Entered By: Rebecca Eaton, RN, Sendra on 05/28/2015 13:13:32 [name] [name] ([id number]) -------------------------------------------------------------------------------- Wound Assessment Details Patient Name: [name] [name] Date of Service: 05/28/2015 12:45 PM Medical Record Number: [id number] Patient Account Number: [id number] Date of Birth/Sex: 12/02/1926 (82 y.o. Male) Treating RN: Macarthur Critchley Primary Care Physician: Alvester Chou Other Clinician: Referring Physician: Alvester Chou Treating Physician/Extender: Frann Rider in Treatment: 7 Wound Status Wound Number: 2 Primary Lymphedema Etiology: Wound Location: Left Lower Leg - Circumfernential Wound Open Status: Wounding Event: Gradually Appeared Comorbid Congestive Heart Failure, Date Acquired: 12/28/2014 History: Hypertension, Type II Diabetes, Weeks Of Treatment: 7 Neuropathy Clustered Wound: Yes Photos Photo Uploaded By: Rebecca Eaton, RN, Roslynn Amble on 05/28/2015 16:29:47 Wound Measurements Length: (cm) 11 Width: (cm) 13.5 Depth: (cm) 0.1 Area: (cm) 116.632 Volume: (cm) 11.663 % Reduction in Area: 55.8% % Reduction in Volume: 55.8% Epithelialization: Medium (34-66%) Tunneling: No Undermining: No Wound Description Classification: Partial Thickness Diabetic Severity (Wagner): Grade 1 Wound Margin: Indistinct, nonvisibl Exudate Amount: Large Exudate Type: Serosanguineous Exudate Color: red, brown Foul Odor After Cleansing: Yes Due to Product Use: No e Wound Bed Granulation Amount: Large (67-100%) Exposed Structure Granulation Quality: Red, Pink, Hyper-granulation Fascia Exposed: No Hogge,  [name] ([id number]) Necrotic Amount: Small (1-33%) Fat Layer Exposed: No Necrotic Quality: Adherent Slough Tendon Exposed: No Muscle Exposed: No Joint Exposed: No Bone Exposed: No Limited to Skin Breakdown Periwound Skin Texture Texture Color No Abnormalities  Noted: No No Abnormalities Noted: No Callus: No Atrophie Blanche: No Crepitus: No Cyanosis: No Excoriation: No Ecchymosis: No Fluctuance: No Erythema: No Friable: No Hemosiderin Staining: No Induration: No Mottled: No Localized Edema: Yes Pallor: No Rash: No Rubor: No Scarring: No Temperature / Pain Moisture Temperature: No Abnormality No Abnormalities Noted: No Dry / Scaly: No Maceration: Yes Moist: Yes Wound Preparation Ulcer Cleansing: Other: soap and water, Topical Anesthetic Applied: None Treatment Notes Wound #2 (Left, Circumferential Lower Leg) 1. Cleansed with: Cleanse wound with antibacterial soap and water 4. Dressing Applied: Aquacel Ag 5. Secondary Dressing Applied ABD Pad Dry Gauze 7. Secured with Water engineer) Signed: 05/28/2015 4:43:04 PM By: Rebecca Eaton, RN, Sendra Entered By: Rebecca Eaton, RN, Sendra on 05/28/2015 13:14:02 [name] [name] ([id number]) -------------------------------------------------------------------------------- Wound Assessment Details Patient Name: [name] [name] Date of Service: 05/28/2015 12:45 PM Medical Record Number: [id number] Patient Account Number: [id number] Date of Birth/Sex: [birth year] (82 y.o. Male) Treating RN: Macarthur Critchley Primary Care Physician: Alvester Chou Other Clinician: Referring Physician: Alvester Chou Treating Physician/Extender: Frann Rider in Treatment: 7 Wound Status Wound Number: 3 Primary Lymphedema Etiology: Wound Location: Left Foot - Plantar Wound Open Wounding Event: Gradually Appeared Status: Date Acquired: 01/27/2015 Comorbid Congestive Heart Failure, Weeks Of Treatment: 7 History: Hypertension, Type II Diabetes, Clustered Wound: No Neuropathy Photos Photo Uploaded By: Rebecca Eaton, RN, Roslynn Amble on 05/28/2015 16:29:48 Wound Measurements Length: (cm) 1 Width: (cm) 1.8 Depth: (cm) 0.1 Area: (cm) 1.414 Volume: (cm) 0.141 % Reduction in Area: 81.2% %  Reduction in Volume: 81.3% Epithelialization: Small (1-33%) Tunneling: No Undermining: No Wound Description Classification: Partial Thickness Diabetic Severity Earleen Newport): Grade 0 Wound Margin: Indistinct, nonvisible Exudate Amount: Medium Exudate Type: Serous Exudate Color: amber Foul Odor After Cleansing: No Wound Bed Granulation Amount: Large (67-100%) Exposed Structure Granulation Quality: Pink Fascia Exposed: No Necrotic Amount: Small (1-33%) Fat Layer Exposed: No Tucciarone, Redell ([id number]) Necrotic Quality: Adherent Slough Tendon Exposed: No Muscle Exposed: No Joint Exposed: No Bone Exposed: No Limited to Skin Breakdown Periwound Skin Texture Texture Color No Abnormalities Noted: No No Abnormalities Noted: No Callus: No Atrophie Blanche: No Crepitus: No Cyanosis: No Excoriation: No Ecchymosis: No Fluctuance: No Erythema: No Friable: No Hemosiderin Staining: No Induration: No Mottled: No Localized Edema: No Pallor: No Rash: No Rubor: No Scarring: No Temperature / Pain Moisture Temperature: No Abnormality No Abnormalities Noted: No Dry / Scaly: No Maceration: Yes Moist: Yes Wound Preparation Ulcer Cleansing: Other: water and soap, Treatment Notes Wound #3 (Left, Plantar Foot) 1. Cleansed with: Cleanse wound with antibacterial soap and water 4. Dressing Applied: Aquacel Ag 5. Secondary Dressing Applied ABD Pad Dry Gauze 7. Secured with Water engineer) Signed: 05/28/2015 4:43:04 PM By: Rebecca Eaton, RN, Sendra Entered By: Rebecca Eaton RN, Sendra on 05/28/2015 13:14:21 [name] [name] ([id number]) -------------------------------------------------------------------------------- Vitals Details Patient Name: [name] [name] Date of Service: 05/28/2015 12:45 PM Medical Record Number: [id number] Patient Account Number: [id number] Date of Birth/Sex: 1926/06/06 (82 y.o. Male) Treating RN: Macarthur Critchley Primary Care Physician: Alvester Chou Other Clinician: Referring Physician: Alvester Chou Treating Physician/Extender: Frann Rider in Treatment: 7 Vital Signs Time Taken: 13:00 Temperature (F): 97.8 Height (in): 70 Pulse (bpm): 64 Weight (lbs): 230 Blood Pressure (mmHg): 123/67 Body Mass Index (BMI): 33 Reference Range: 80 - 120 mg /  dl Electronic Signature(s) Signed: 05/28/2015 4:43:04 PM By: Rebecca Eaton RN, Sendra Entered By: Rebecca Eaton RN, Sendra on 05/28/2015 13:02:03

## 2015-05-29 NOTE — Progress Notes (Signed)
RANKIN, NOVEMBER ([id number]) Visit Report for 05/28/2015 Chief Complaint Document Details Patient Name: [name] [name], [name] [name] Date of Service: 05/28/2015 12:45 PM Medical Record Number: [id number] Patient Account Number: [id number] Date of Birth/Sex: 01-25-1927 (82 y.o. Male) Treating RN: Macarthur Critchley Primary Care Physician: Alvester Chou Other Clinician: Referring Physician: Alvester Chou Treating Physician/Extender: Frann Rider in Treatment: 7 Information Obtained from: Patient Chief Complaint Patient presents to the wound care center for a consult due non healing wound both lower extremities. The patient is a poor historian and was seen here once in July 2016. He says home health was doing his dressings and he was healed but now has recurrence of the problem. Problem has been there at least for 6 months. Electronic Signature(s) Signed: 05/28/2015 1:33:46 PM By: Christin Fudge MD, FACS Entered By: Christin Fudge on 05/28/2015 13:33:46 [name] [name] ([id number]) -------------------------------------------------------------------------------- HPI Details Patient Name: [name] [name] Date of Service: 05/28/2015 12:45 PM Medical Record Number: [id number] Patient Account Number: [id number] Date of Birth/Sex: 1926/11/17 (82 y.o. Male) Treating RN: Macarthur Critchley Primary Care Physician: Alvester Chou Other Clinician: Referring Physician: Alvester Chou Treating Physician/Extender: Frann Rider in Treatment: 7 History of Present Illness Location: swelling of the legs right more than left an ulceration both lower extremities Quality: Patient reports experiencing a dull pain to affected area(s). Severity: Patient states wound (s) are getting worse Duration: Patient has had the wound for > 6 months prior to seeking treatment at the wound center Timing: Pain in wound is Intermittent. Context: The wound appeared gradually over time Modifying Factors: he has been having home health come and apply his  dressing daily was healed. Associated Signs and Symptoms: Patient reports having difficulty standing for long periods. HPI Description: [name] [name] is a 82 y.o. male with history of DM, hypertension, chronic kidney disease, gout, diastolic dysfunction and the patient was having increasing swelling with increasing fluid draining from the legs and pain over the last 6 weeks. He says his wounds are completely healed and the home health had stopped coming to do his dressings. Recent hemoglobin A1c was 6.6. During the last admission A venous duplex study revealed no evidence of deep vein thrombosis or superficial thrombosis involving the right and left lower extremity. Was seen in January 2016 by Dr. Adele Barthel at [facility]: Non-Invasive Vascular Imaging ABI (05/05/2014) o R: 0.65 (0.73), DP: mono, PT: mono, TBI: 0.57 o L: 0.65 (0.69), DP: mono, PT: mono, TBI: 0.36 The patient had the diagnosis of bilateral lower extremity moderate peripheral atherosclerotic disease, bilateral lower extremity chronic venous insufficiency. Due to the patient's age and mental status conservative therapy was only discussed and no surgical intervention was planned. The patient has significant dementia and has a caregiver who spends a few hours every day coming to see him. He does not have any family members. 05/21/2015 -- lower extremity arterial studies done on 05/17/2015 show the right ABI is 0.50-0.79 indicating moderate arterial occlusive disease at rest. The left ABI was 0.5-0.79 indicating moderate arterial occlusive disease at rest. The left posterior tibial and peroneal arteries were not evaluated due to local wound condition. The right TBI was less than 0.69 and the left ABI was less than 0.69 which are both abnormal. Since it's been a year since the previous opinion from Dr. Bridgett Larsson I have asked him to see him for a review. 05/28/2015 -- was seen by Dr. Bridgett Larsson and he recommended that he was not a good candidate for  surgery and surgical revascularization if the need develops. He  recommends to continue with compressive therapy and wound care. Electronic Signature(s) Signed: 05/28/2015 1:34:16 PM By: Christin Fudge MD, FACS Previous Signature: 05/28/2015 1:33:30 PM Version By: Christin Fudge MD, FACS [name] [name] ([id number]) Entered By: Christin Fudge on 05/28/2015 13:34:16 ALGIA, TRAPASSO ([id number]) -------------------------------------------------------------------------------- Physical Exam Details Patient Name: [name] [name] Date of Service: 05/28/2015 12:45 PM Medical Record Number: [id number] Patient Account Number: [id number] Date of Birth/Sex: 10-Jul-1926 (82 y.o. Male) Treating RN: Macarthur Critchley Primary Care Physician: Alvester Chou Other Clinician: Referring Physician: Alvester Chou Treating Physician/Extender: Frann Rider in Treatment: 7 Constitutional . Pulse regular. Respirations normal and unlabored. Afebrile. . Eyes Nonicteric. Reactive to light. Ears, Nose, Mouth, and Throat Lips, teeth, and gums WNL.Marland Kitchen Moist mucosa without lesions. Neck supple and nontender. No palpable supraclavicular or cervical adenopathy. Normal sized without goiter. Respiratory WNL. No retractions.. Breath sounds WNL, No rubs, rales, rhonchi, or wheeze.. Cardiovascular Heart rhythm and rate regular, no murmur or gallop.. Pedal Pulses WNL. No clubbing, cyanosis or edema. Lymphatic No adneopathy. No adenopathy. No adenopathy. Musculoskeletal Adexa without tenderness or enlargement.. Digits and nails w/o clubbing, cyanosis, infection, petechiae, ischemia, or inflammatory conditions.. Integumentary (Hair, Skin) No suspicious lesions. No crepitus or fluctuance. No peri-wound warmth or erythema. No masses.Marland Kitchen Psychiatric Judgement and insight Intact.. No evidence of depression, anxiety, or agitation.. Notes the stage II lymphedema persists( but his ulceration is looking much better.) no debridement was  required today Electronic Signature(s) Signed: 05/28/2015 1:35:06 PM By: Christin Fudge MD, FACS Entered By: Christin Fudge on 05/28/2015 13:35:05 [name] [name] ([id number]) -------------------------------------------------------------------------------- Physician Orders Details Patient Name: [name] [name] Date of Service: 05/28/2015 12:45 PM Medical Record Number: [id number] Patient Account Number: [id number] Date of Birth/Sex: [birth year] (82 y.o. Male) Treating RN: Macarthur Critchley Primary Care Physician: Alvester Chou Other Clinician: Referring Physician: Alvester Chou Treating Physician/Extender: Frann Rider in Treatment: 7 Verbal / Phone Orders: Yes Clinician: Macarthur Critchley Read Back and Verified: Yes Diagnosis Coding Wound Cleansing Wound #1 Right,Circumferential Lower Leg o Cleanse wound with mild soap and water - may shower on dressing change days o May Shower, gently pat wound dry prior to applying new dressing. o May shower with protection. - may shower with wraps on, must use shower bag for protection. do not get leg wraps wet Wound #2 Left,Circumferential Lower Leg o Cleanse wound with mild soap and water - may shower on dressing change days o May Shower, gently pat wound dry prior to applying new dressing. o May shower with protection. - may shower with wraps on, must use shower bag for protection. do not get leg wraps wet Wound #3 Left,Plantar Foot o Cleanse wound with mild soap and water - may shower on dressing change days o May Shower, gently pat wound dry prior to applying new dressing. o May shower with protection. - may shower with wraps on, must use shower bag for protection. do not get leg wraps wet Primary Wound Dressing Wound #1 Right,Circumferential Lower Leg o Aquacel Ag Wound #2 Left,Circumferential Lower Leg o Aquacel Ag Wound #3 Left,Plantar Foot o Aquacel Ag Secondary Dressing Wound #1 Right,Circumferential Lower  Leg o ABD pad Wound #2 Left,Circumferential Lower Leg o ABD pad Wound #3 Left,Plantar Foot Fabre, Kaitlyn ([id number]) o ABD pad Dressing Change Frequency Wound #1 Right,Circumferential Lower Leg o Three times weekly Wound #2 Left,Circumferential Lower Leg o Three times weekly Wound #3 Left,Plantar Foot o Three times weekly Follow-up Appointments Wound #1 Right,Circumferential Lower Leg o Return Appointment in 1 week. Wound #2 Left,Circumferential Lower Leg o  Return Appointment in 1 week. Wound #3 Left,Plantar Foot o Return Appointment in 1 week. Edema Control Wound #1 Right,Circumferential Lower Leg o Unna Boots Bilaterally Wound #2 Left,Circumferential Lower Leg o Unna Boots Bilaterally Wound #3 Left,Plantar Foot o Unna Boots Bilaterally Home Health Wound #1 Right,Circumferential Lower Leg o [place] Visits Lajean Man[facility] please order Juzo compression wraps for patient. Measurements as follows: L ankle - 27cm, calf - 38cm; R ankle - 28cm, 37cm; heel to knee length 47cm o Home Health Nurse may visit PRN to address patientos wound care needs. o FACE TO FACE ENCOUNTER: MEDICARE and MEDICAID PATIENTS: I certify that this patient is under my care and that I had a face-to-face encounter that meets the physician face-to-face encounter requirements with this patient on this date. The encounter with the patient was in whole or in part for the following MEDICAL CONDITION: (primary reason for [place]) MEDICAL NECESSITY: I certify, that based on my findings, NURSING services are a medically necessary home health service. HOME BOUND STATUS: I certify that my clinical findings support that this patient is homebound (i.e., Due to illness or injury, pt requires aid of supportive devices such as crutches, cane, wheelchairs, walkers, the use of special transportation or the assistance of another person to leave their place of residence.  There is a normal inability to leave the home and doing so requires considerable and taxing effort. TRAVANTI, BRANDO ([id number]) absences are for medical reasons / religious services and are infrequent or of short duration when for other reasons). o If current dressing causes regression in wound condition, may D/C ordered dressing product/s and apply Normal Saline Moist Dressing daily until next Spring [name] / Other MD appointment. [place] of regression in wound condition at [phone]. o Please direct any NON-WOUND related issues/requests for orders to patient's Primary Care Physician Wound #2 Left,Circumferential Lower Leg o [place] please order Juzo compression wraps for patient. Measurements as follows: L ankle - 27cm, calf - 38cm; R ankle - 28cm, 37cm; heel to knee length 47cm o Home Health Nurse may visit PRN to address patientos wound care needs. o FACE TO FACE ENCOUNTER: MEDICARE and MEDICAID PATIENTS: I certify that this patient is under my care and that I had a face-to-face encounter that meets the physician face-to-face encounter requirements with this patient on this date. The encounter with the patient was in whole or in part for the following MEDICAL CONDITION: (primary reason for [place]) MEDICAL NECESSITY: I certify, that based on my findings, NURSING services are a medically necessary home health service. HOME BOUND STATUS: I certify that my clinical findings support that this patient is homebound (i.e., Due to illness or injury, pt requires aid of supportive devices such as crutches, cane, wheelchairs, walkers, the use of special transportation or the assistance of another person to leave their place of residence. There is a normal inability to leave the home and doing so requires considerable and taxing effort. Other absences are for medical reasons / religious services and are  infrequent or of short duration when for other reasons). o If current dressing causes regression in wound condition, may D/C ordered dressing product/s and apply Normal Saline Moist Dressing daily until next [place] / Other MD appointment. [place] of regression in wound condition at [phone]. o Please direct any NON-WOUND related issues/requests for orders to patient's Primary Care Physician Wound #3 Left,Plantar Foot   o [place] please order Juzo compression wraps for patient. Measurements as follows: L ankle - 27cm, calf - 38cm; R ankle - 28cm, 37cm; heel to knee length 47cm o Home Health Nurse may visit PRN to address patientos wound care needs. o FACE TO FACE ENCOUNTER: MEDICARE and MEDICAID PATIENTS: I certify that this patient is under my care and that I had a face-to-face encounter that meets the physician face-to-face encounter requirements with this patient on this date. The encounter with the patient was in whole or in part for the following MEDICAL CONDITION: (primary reason for [place]) MEDICAL NECESSITY: I certify, that based on my findings, NURSING services are a medically necessary home health service. HOME BOUND STATUS: I certify that my clinical findings support that this patient is homebound (i.e., Due to illness or injury, pt requires aid of supportive devices such as crutches, cane, wheelchairs, walkers, the use of special transportation or the assistance of another person to leave their place of residence. There is a normal inability to leave the home and doing so requires considerable and taxing effort. Other absences are for medical reasons / religious services and are infrequent or of short duration when for other reasons). RHYLEE, WORTHING ([id number]) o If current dressing causes regression in wound condition, may D/C ordered dressing product/s and apply Normal Saline Moist  Dressing daily until next [place] / Other MD appointment. [place] of regression in wound condition at [phone]. o Please direct any NON-WOUND related issues/requests for orders to patient's Primary Care Physician Electronic Signature(s) Signed: 05/28/2015 4:26:31 PM By: Christin Fudge MD, FACS Signed: 05/28/2015 4:43:04 PM By: Rebecca Eaton RN, [place] Entered By: Rebecca Eaton RN, [place] on 05/28/2015 13:29:36 [name] [name] ([id number]) -------------------------------------------------------------------------------- Problem List Details Patient Name: [name] [name] Date of Service: 05/28/2015 12:45 PM Medical Record Number: [id number] Patient Account Number: [id number] Date of Birth/Sex: 07/24/1926 (82 y.o. Male) Treating RN: Macarthur Critchley Primary Care Physician: Alvester Chou Other Clinician: Referring Physician: Alvester Chou Treating Physician/Extender: Frann Rider in Treatment: 7 Active Problems ICD-10 Encounter Code Description Active Date Diagnosis E11.622 Type 2 diabetes mellitus with other skin ulcer 04/09/2015 Yes I89.0 Lymphedema, not elsewhere classified 04/09/2015 Yes I70.238 Atherosclerosis of native arteries of right leg with 04/09/2015 Yes ulceration of other part of lower right leg I70.248 Atherosclerosis of native arteries of left leg with ulceration 04/09/2015 Yes of other part of lower left leg I87.331 Chronic venous hypertension (idiopathic) with ulcer and 04/09/2015 Yes inflammation of right lower extremity I87.332 Chronic venous hypertension (idiopathic) with ulcer and 04/09/2015 Yes inflammation of left lower extremity Inactive Problems Resolved Problems Electronic Signature(s) Signed: 05/28/2015 1:33:40 PM By: Christin Fudge MD, FACS Entered By: Christin Fudge on 05/28/2015 13:33:40 [name] [name] ([id number]) -------------------------------------------------------------------------------- Progress Note Details Patient Name:  [name] [name] Date of Service: 05/28/2015 12:45 PM Medical Record Number: [id number] Patient Account Number: [id number] Date of Birth/Sex: Dec 23, 1926 (82 y.o. Male) Treating RN: Macarthur Critchley Primary Care Physician: Alvester Chou Other Clinician: Referring Physician: Alvester Chou Treating Physician/Extender: Frann Rider in Treatment: 7 Subjective Chief Complaint Information obtained from Patient Patient presents to the wound care center for a consult due non healing wound both lower extremities. The patient is a poor historian and was seen here once in July 2016. He says home health was doing his dressings and he was healed but now has recurrence of the problem. Problem has been there at least for 6 months. History of Present Illness (HPI) The following  HPI elements were documented for the patient's wound: Location: swelling of the legs right more than left an ulceration both lower extremities Quality: Patient reports experiencing a dull pain to affected area(s). Severity: Patient states wound (s) are getting worse Duration: Patient has had the wound for > 6 months prior to seeking treatment at the wound center Timing: Pain in wound is Intermittent. Context: The wound appeared gradually over time Modifying Factors: he has been having home health come and apply his dressing daily was healed. Associated Signs and Symptoms: Patient reports having difficulty standing for long periods. [name] [name] is a 82 y.o. male with history of DM, hypertension, chronic kidney disease, gout, diastolic dysfunction and the patient was having increasing swelling with increasing fluid draining from the legs and pain over the last 6 weeks. He says his wounds are completely healed and the home health had stopped coming to do his dressings. Recent hemoglobin A1c was 6.6. During the last admission A venous duplex study revealed no evidence of deep vein thrombosis or superficial thrombosis involving the  right and left lower extremity. Was seen in January 2016 by Dr. Adele Barthel at [facility] Non-Invasive Vascular Imaging ABI (05/05/2014)  R: 0.65 (0.73), DP: mono, PT: mono, TBI: 0.57  L: 0.65 (0.69), DP: mono, PT: mono, TBI: 0.36 The patient had the diagnosis of bilateral lower extremity moderate peripheral atherosclerotic disease, bilateral lower extremity chronic venous insufficiency. Due to the patient's age and mental status conservative therapy was only discussed and no surgical intervention was planned. The patient has significant dementia and has a caregiver who spends a few hours every day coming to see him. He does not have any family members. 05/21/2015 -- lower extremity arterial studies done on 05/17/2015 show the right ABI is 0.50-0.79 indicating moderate arterial occlusive disease at rest. The left ABI was 0.5-0.79 indicating moderate arterial occlusive disease at rest. The left posterior tibial and peroneal arteries were not evaluated due to local wound Zietlow, Larance ([id number]) condition. The right TBI was less than 0.69 and the left ABI was less than 0.69 which are both abnormal. Since it's been a year since the previous opinion from Dr. Bridgett Larsson I have asked him to see him for a review. 05/28/2015 -- was seen by Dr. Bridgett Larsson and he recommended that he was not a good candidate for surgery and surgical revascularization if the need develops. He recommends to continue with compressive therapy and wound care. Objective Constitutional Pulse regular. Respirations normal and unlabored. Afebrile. Vitals Time Taken: 1:00 PM, Height: 70 in, Weight: 230 lbs, BMI: 33, Temperature: 97.8 F, Pulse: 64 bpm, Blood Pressure: 123/67 mmHg. Eyes Nonicteric. Reactive to light. Ears, Nose, Mouth, and Throat Lips, teeth, and gums WNL.Marland Kitchen Moist mucosa without lesions. Neck supple and nontender. No palpable supraclavicular or cervical adenopathy. Normal sized without goiter. Respiratory WNL. No  retractions.. Breath sounds WNL, No rubs, rales, rhonchi, or wheeze.. Cardiovascular Heart rhythm and rate regular, no murmur or gallop.. Pedal Pulses WNL. No clubbing, cyanosis or edema. Lymphatic No adneopathy. No adenopathy. No adenopathy. Musculoskeletal Adexa without tenderness or enlargement.. Digits and nails w/o clubbing, cyanosis, infection, petechiae, ischemia, or inflammatory conditions.Marland Kitchen Psychiatric Judgement and insight Intact.. No evidence of depression, anxiety, or agitation.. General Notes: the stage II lymphedema persists( but his ulceration is looking much better.) no debridement was required today [place], [name] [name] ([id number]) Integumentary (Hair, Skin) No suspicious lesions. No crepitus or fluctuance. No peri-wound warmth or erythema. No masses.. Wound #1 status is Open. Original cause of wound was  Gradually Appeared. The wound is located on the Right,Circumferential Lower Leg. The wound measures 3.7cm length x 2.8cm width x 0.1cm depth; 8.137cm^2 area and 0.814cm^3 volume. The wound is limited to skin breakdown. There is no tunneling or undermining noted. There is a medium amount of serosanguineous drainage noted. The wound margin is indistinct and nonvisible. There is large (67-100%) pink, pale granulation within the wound bed. There is a small (1-33%) amount of necrotic tissue within the wound bed including Adherent Slough. The periwound skin appearance exhibited: Maceration, Moist. The periwound skin appearance did not exhibit: Callus, Crepitus, Excoriation, Fluctuance, Friable, Induration, Localized Edema, Rash, Scarring, Dry/Scaly, Atrophie Blanche, Cyanosis, Ecchymosis, Hemosiderin Staining, Mottled, Pallor, Rubor, Erythema. Periwound temperature was noted as No Abnormality. Wound #2 status is Open. Original cause of wound was Gradually Appeared. The wound is located on the Left,Circumferential Lower Leg. The wound measures 11cm length x 13.5cm width x 0.1cm  depth; 116.632cm^2 area and 11.663cm^3 volume. The wound is limited to skin breakdown. There is no tunneling or undermining noted. There is a large amount of serosanguineous drainage noted. The wound margin is indistinct and nonvisible. There is large (67-100%) red, pink granulation within the wound bed. There is a small (1-33%) amount of necrotic tissue within the wound bed including Adherent Slough. The periwound skin appearance exhibited: Localized Edema, Maceration, Moist. The periwound skin appearance did not exhibit: Callus, Crepitus, Excoriation, Fluctuance, Friable, Induration, Rash, Scarring, Dry/Scaly, Atrophie Blanche, Cyanosis, Ecchymosis, Hemosiderin Staining, Mottled, Pallor, Rubor, Erythema. Periwound temperature was noted as No Abnormality. Wound #3 status is Open. Original cause of wound was Gradually Appeared. The wound is located on the [place]. The wound measures 1cm length x 1.8cm width x 0.1cm depth; 1.414cm^2 area and 0.141cm^3 volume. The wound is limited to skin breakdown. There is no tunneling or undermining noted. There is a medium amount of serous drainage noted. The wound margin is indistinct and nonvisible. There is large (67-100%) pink granulation within the wound bed. There is a small (1-33%) amount of necrotic tissue within the wound bed including Adherent Slough. The periwound skin appearance exhibited: Maceration, Moist. The periwound skin appearance did not exhibit: Callus, Crepitus, Excoriation, Fluctuance, Friable, Induration, Localized Edema, Rash, Scarring, Dry/Scaly, Atrophie Blanche, Cyanosis, Ecchymosis, Hemosiderin Staining, Mottled, Pallor, Rubor, Erythema. Periwound temperature was noted as No Abnormality. Assessment Active Problems ICD-10 E11.622 - Type 2 diabetes mellitus with other skin ulcer I89.0 - Lymphedema, not elsewhere classified I70.238 - Atherosclerosis of native arteries of right leg with ulceration of other part of lower  right leg I70.248 - Atherosclerosis of native arteries of left leg with ulceration of other part of lower left leg I87.331 - Chronic venous hypertension (idiopathic) with ulcer and inflammation of right lower extremity [place], [name] [name] ([id number]) [id number] - Chronic venous hypertension (idiopathic) with ulcer and inflammation of left lower extremity Since the patient is not a good candidate for vascular surgery we will continue with complex care and use compression and silver alginate until his wounds healed. I have recommended silver alginate and Unna's boots to be applied and changed twice weekly. I have also recommended elevation and exercise and will order him juzo compression stockings with the Velcro wrap. he will come back and see as an regular basis Plan Wound Cleansing: Wound #1 Right,Circumferential Lower Leg: Cleanse wound with mild soap and water - may shower on dressing change days May Shower, gently pat wound dry prior to applying new dressing. May shower with protection. - may shower with wraps on, must use  shower bag for protection. do not get leg wraps wet Wound #2 Left,Circumferential Lower Leg: Cleanse wound with mild soap and water - may shower on dressing change days May Shower, gently pat wound dry prior to applying new dressing. May shower with protection. - may shower with wraps on, must use shower bag for protection. do not get leg wraps wet Wound #3 Left,Plantar Foot: Cleanse wound with mild soap and water - may shower on dressing change days May Shower, gently pat wound dry prior to applying new dressing. May shower with protection. - may shower with wraps on, must use shower bag for protection. do not get leg wraps wet Primary Wound Dressing: Wound #1 Right,Circumferential Lower Leg: Aquacel Ag Wound #2 Left,Circumferential Lower Leg: Aquacel Ag Wound #3 Left,Plantar Foot: Aquacel Ag Secondary Dressing: Wound #1 Right,Circumferential Lower Leg: ABD  pad Wound #2 Left,Circumferential Lower Leg: ABD pad Wound #3 Left,Plantar Foot: ABD pad [name], [name] ([id number]) Dressing Change Frequency: Wound #1 Right,Circumferential Lower Leg: Three times weekly Wound #2 Left,Circumferential Lower Leg: Three times weekly Wound #3 Left,Plantar Foot: Three times weekly Follow-up Appointments: Wound #1 Right,Circumferential Lower Leg: Return Appointment in 1 week. Wound #2 Left,Circumferential Lower Leg: Return Appointment in 1 week. Wound #3 Left,Plantar Foot: Return Appointment in 1 week. Edema Control: Wound #1 Right,Circumferential Lower Leg: Unna Boots Bilaterally Wound #2 Left,Circumferential Lower Leg: Retail buyer Bilaterally Wound #3 Left,Plantar Foot: Retail buyer Bilaterally Home Health: Wound #1 Right,Circumferential Lower Leg: Continue Home Health Visits - [place] please order Juzo compression wraps for patient. Measurements as follows: L ankle - 27cm, calf - 38cm; R ankle - 28cm, 37cm; heel to knee length 47cm Home Health Nurse may visit PRN to address patient s wound care needs. FACE TO FACE ENCOUNTER: MEDICARE and MEDICAID PATIENTS: I certify that this patient is under my care and that I had a face-to-face encounter that meets the physician face-to-face encounter requirements with this patient on this date. The encounter with the patient was in whole or in part for the following MEDICAL CONDITION: (primary reason for [place]) MEDICAL NECESSITY: I certify, that based on my findings, NURSING services are a medically necessary home health service. HOME BOUND STATUS: I certify that my clinical findings support that this patient is homebound (i.e., Due to illness or injury, pt requires aid of supportive devices such as crutches, cane, wheelchairs, walkers, the use of special transportation or the assistance of another person to leave their place of residence. There is a normal inability to leave the home and doing so  requires considerable and taxing effort. Other absences are for medical reasons / religious services and are infrequent or of short duration when for other reasons). If current dressing causes regression in wound condition, may D/C ordered dressing product/s and apply Normal Saline Moist Dressing daily until next [place] / Other MD appointment. [place] of regression in wound condition at [phone]. Please direct any NON-WOUND related issues/requests for orders to patient's Primary Care Physician Wound #2 Left,Circumferential Lower Leg: [place] please order Juzo compression wraps for patient. Measurements as follows: L ankle - 27cm, calf - 38cm; R ankle - 28cm, 37cm; heel to knee length 47cm Home Health Nurse may visit PRN to address patient s wound care needs. FACE TO FACE ENCOUNTER: MEDICARE and MEDICAID PATIENTS: I certify that this patient is under my care and that I had a face-to-face encounter that meets the physician face-to-face encounter requirements  with this patient on this date. The encounter with the patient was in whole or in part for the following MEDICAL CONDITION: (primary reason for [place]) MEDICAL NECESSITY: I certify, that based on my findings, NURSING services are a medically necessary home health service. HOME BOUND STATUS: I certify that my clinical findings support that this patient is homebound (i.e., Due to illness or injury, pt requires aid of supportive devices such as crutches, cane, wheelchairs, walkers, the use [name] [name], [name] [name] ([id number]) of special transportation or the assistance of another person to leave their place of residence. There is a normal inability to leave the home and doing so requires considerable and taxing effort. Other absences are for medical reasons / religious services and are infrequent or of short duration when for other reasons). If current dressing causes  regression in wound condition, may D/C ordered dressing product/s and apply Normal Saline Moist Dressing daily until next [place] / Other MD appointment. [name] [name] of regression in wound condition at [phone]. Please direct any NON-WOUND related issues/requests for orders to patient's Primary Care Physician Wound #3 Left,Plantar Foot: [name] [name] please order Juzo compression wraps for patient. Measurements as follows: L ankle - 27cm, calf - 38cm; R ankle - 28cm, 37cm; heel to knee length 47cm Home Health Nurse may visit PRN to address patient s wound care needs. FACE TO FACE ENCOUNTER: MEDICARE and MEDICAID PATIENTS: I certify that this patient is under my care and that I had a face-to-face encounter that meets the physician face-to-face encounter requirements with this patient on this date. The encounter with the patient was in whole or in part for the following MEDICAL CONDITION: (primary reason for [name] [name]) MEDICAL NECESSITY: I certify, that based on my findings, NURSING services are a medically necessary home health service. HOME BOUND STATUS: I certify that my clinical findings support that this patient is homebound (i.e., Due to illness or injury, pt requires aid of supportive devices such as crutches, cane, wheelchairs, walkers, the use of special transportation or the assistance of another person to leave their place of residence. There is a normal inability to leave the home and doing so requires considerable and taxing effort. Other absences are for medical reasons / religious services and are infrequent or of short duration when for other reasons). If current dressing causes regression in wound condition, may D/C ordered dressing product/s and apply Normal Saline Moist Dressing daily until next [place] / Other MD appointment. [place] of regression in wound condition at  [phone]. Please direct any NON-WOUND related issues/requests for orders to patient's Primary Care Physician Since the patient is not a good candidate for vascular surgery we will continue with complex care and use compression and silver alginate until his wounds healed. I have recommended silver alginate and Unna's boots to be applied and changed twice weekly. I have also recommended elevation and exercise and will order him juzo compression stockings with the Velcro wrap. he will come back and see as an regular basis Electronic Signature(s) Signed: 05/28/2015 1:36:51 PM By: Christin Fudge MD, FACS Entered By: Christin Fudge on 05/28/2015 13:36:51 [name] [name] ([id number]) -------------------------------------------------------------------------------- SuperBill Details Patient Name: [name] [name] Date of Service: 05/28/2015 Medical Record Number: [id number] Patient Account Number: [id number] Date of Birth/Sex: 01-08-1927 (82 y.o. 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 left leg with ulceration of other part of lower left leg Chronic venous hypertension (idiopathic) with ulcer and inflammation of right lower I87.331 extremity Chronic venous hypertension (idiopathic) with ulcer and inflammation of left lower I87.332 extremity Facility Procedures CPT4 Code: [id number] Description: (Facility Use Only) [id number] - APPLY UNNA BOOT LT Modifier: Quantity: 1 CPT4 Code: [id number] Description: (Facility Use Only) [id number] - APPLY UNNA BOOT  RT Modifier: Quantity: 1 Physician Procedures CPT4: Description Modifier Quantity Code [id number] - WC PHYS LEVEL 3 - EST PT 1 ICD-10 Description Diagnosis E11.622 Type 2 diabetes mellitus with other skin ulcer I89.0 Lymphedema, not elsewhere classified I70.238 Atherosclerosis of native arteries  of right leg with ulceration of other part of lower right leg I70.248 Atherosclerosis of native arteries of left leg with ulceration of other part of lower left leg Electronic Signature(s) Signed: 05/28/2015 4:26:31 PM By: Christin Fudge MD, FACS Signed: 05/28/2015 4:43:04 PM By: Rebecca Eaton RN, Sendra Previous Signature: 05/28/2015 1:37:03 PM Version By: Christin Fudge MD, FACS [name] [name] ([id number]) Entered By: Rebecca Eaton RN, Roslynn Amble on 05/28/2015 14:06:44

## 2015-06-04 ENCOUNTER — Encounter: Payer: Medicare Other | Attending: Surgery | Admitting: Surgery

## 2015-06-04 DIAGNOSIS — I87332 Chronic venous hypertension (idiopathic) with ulcer and inflammation of left lower extremity: Secondary | ICD-10-CM | POA: Insufficient documentation

## 2015-06-04 DIAGNOSIS — E1122 Type 2 diabetes mellitus with diabetic chronic kidney disease: Secondary | ICD-10-CM | POA: Insufficient documentation

## 2015-06-04 DIAGNOSIS — E1140 Type 2 diabetes mellitus with diabetic neuropathy, unspecified: Secondary | ICD-10-CM | POA: Insufficient documentation

## 2015-06-04 DIAGNOSIS — L97811 Non-pressure chronic ulcer of other part of right lower leg limited to breakdown of skin: Secondary | ICD-10-CM | POA: Diagnosis not present

## 2015-06-04 DIAGNOSIS — N189 Chronic kidney disease, unspecified: Secondary | ICD-10-CM | POA: Diagnosis not present

## 2015-06-04 DIAGNOSIS — I87331 Chronic venous hypertension (idiopathic) with ulcer and inflammation of right lower extremity: Secondary | ICD-10-CM | POA: Diagnosis not present

## 2015-06-04 DIAGNOSIS — I70248 Atherosclerosis of native arteries of left leg with ulceration of other part of lower left leg: Secondary | ICD-10-CM | POA: Insufficient documentation

## 2015-06-04 DIAGNOSIS — I129 Hypertensive chronic kidney disease with stage 1 through stage 4 chronic kidney disease, or unspecified chronic kidney disease: Secondary | ICD-10-CM | POA: Diagnosis not present

## 2015-06-04 DIAGNOSIS — L97821 Non-pressure chronic ulcer of other part of left lower leg limited to breakdown of skin: Secondary | ICD-10-CM | POA: Diagnosis not present

## 2015-06-04 DIAGNOSIS — E11622 Type 2 diabetes mellitus with other skin ulcer: Secondary | ICD-10-CM | POA: Diagnosis not present

## 2015-06-04 DIAGNOSIS — I70238 Atherosclerosis of native arteries of right leg with ulceration of other part of lower right leg: Secondary | ICD-10-CM | POA: Diagnosis not present

## 2015-06-04 DIAGNOSIS — I890 Lymphedema, not elsewhere classified: Secondary | ICD-10-CM | POA: Insufficient documentation

## 2015-06-04 DIAGNOSIS — F0390 Unspecified dementia without behavioral disturbance: Secondary | ICD-10-CM | POA: Insufficient documentation

## 2015-06-05 NOTE — Progress Notes (Signed)
KIANI, STANDING ([id number]) Visit Report for 06/04/2015 Arrival Information Details Patient Name: [name] [name], [name] [name] Date of Service: 06/04/2015 1:30 PM Medical Record Number: [id number] Patient Account Number: [id number] Date of Birth/Sex: Sep 18, 1926 (82 y.o. Male) Treating RN: Macarthur Critchley Primary Care Physician: Alvester Chou Other Clinician: Referring Physician: Alvester Chou Treating Physician/Extender: Frann Rider in Treatment: 8 Visit Information History Since Last Visit All ordered tests and consults were completed: No Patient Arrived: [name] Added or deleted any medications: No Arrival Time: 13:36 Any new allergies or adverse reactions: No Accompanied By: caregiver Had a fall or experienced change in No Transfer Assistance: None activities of daily living that may affect Patient Identification Verified: Yes risk of falls: Secondary Verification Process Yes Signs or symptoms of abuse/neglect since last No Completed: visito Patient Has Alerts: Yes Hospitalized since last visit: No Has Dressing in Place as Prescribed: Yes Has Compression in Place as Prescribed: No Pain Present Now: No Notes patient told to take dressing down and shower before coming to clinic Electronic Signature(s) Signed: 06/04/2015 4:31:21 PM By: Rebecca Eaton, RN, Sendra Entered By: Rebecca Eaton RN, Sendra on 06/04/2015 13:36:50 [name] [name] ([id number]) -------------------------------------------------------------------------------- Encounter Discharge Information Details Patient Name: [name] [name] Date of Service: 06/04/2015 1:30 PM Medical Record Number: [id number] Patient Account Number: [id number] Date of Birth/Sex: Apr 03, 1927 (82 y.o. Male) Treating RN: Macarthur Critchley Primary Care Physician: Alvester Chou Other Clinician: Referring Physician: Alvester Chou Treating Physician/Extender: Frann Rider in Treatment: 8 Encounter Discharge Information Items Schedule Follow-up Appointment: No Medication  Reconciliation completed No and provided to Patient/Care [name] Sittner: Provided on Clinical Summary of Care: 06/04/2015 Form Type Recipient Paper Patient [facility] Electronic Signature(s) Signed: 06/04/2015 2:30:27 PM By: Ruthine Dose Entered By: Ruthine Dose on 06/04/2015 14:30:27 [name] [name] ([id number]) -------------------------------------------------------------------------------- Lower Extremity Assessment Details Patient Name: [name] [name] Date of Service: 06/04/2015 1:30 PM Medical Record Number: [id number] Patient Account Number: [id number] Date of Birth/Sex: 1927-02-01 (82 y.o. Male) Treating RN: Macarthur Critchley Primary Care Physician: Alvester Chou Other Clinician: Referring Physician: Alvester Chou Treating Physician/Extender: Frann Rider in Treatment: 8 Edema Assessment Assessed: [Left: No] [Right: No] E[Left: dema] [Right: :] Calf Left: Right: Point of Measurement: 35 cm From Medial Instep 42 cm 40 cm Ankle Left: Right: Point of Measurement: 11 cm From Medial Instep 27 cm 29 cm Electronic Signature(s) Signed: 06/04/2015 4:31:21 PM By: Rebecca Eaton, RN, Sendra Entered By: Rebecca Eaton RN, Sendra on 06/04/2015 13:52:13 [name] [name] ([id number]) -------------------------------------------------------------------------------- Pain Assessment Details Patient Name: [name] [name] Date of Service: 06/04/2015 1:30 PM Medical Record Number: [id number] Patient Account Number: [id number] Date of Birth/Sex: 01/28/1927 (82 y.o. Male) Treating RN: Macarthur Critchley Primary Care Physician: Alvester Chou Other Clinician: Referring Physician: Alvester Chou Treating Physician/Extender: Frann Rider in Treatment: 8 Active Problems Location of Pain Severity and Description of Pain Patient Has Paino No Site Locations Rate the pain. Current Pain Level: 0 Pain Management and Medication Current Pain Management: Electronic Signature(s) Signed: 06/04/2015 4:31:21 PM By: Rebecca Eaton, RN, Sendra Entered  By: Rebecca Eaton RN, Sendra on 06/04/2015 13:36:57 [name] [name] ([id number]) -------------------------------------------------------------------------------- Patient/Caregiver Education Details Patient Name: [name] [name] Date of Service: 06/04/2015 1:30 PM Medical Record Number: [id number] Patient Account Number: [id number] Date of Birth/Gender: 1927/03/29 (82 y.o. Male) Treating RN: Macarthur Critchley Primary Care Physician: Alvester Chou Other Clinician: Referring Physician: Alvester Chou Treating Physician/Extender: Frann Rider in Treatment: 8 Education Assessment Education Provided To: Patient and Caregiver Education Topics Provided Wound/Skin Impairment: Handouts: Caring for Your Ulcer, Skin Care Do's and Dont's, Other: lymphedema Methods: Explain/Verbal Responses: State content correctly Electronic  Signature(s) Signed: 06/04/2015 4:31:21 PM By: Rebecca Eaton, RN, Romero Liner By: Rebecca Eaton RN, Sendra on 06/04/2015 14:02:18 [name] [name] ([id number]) -------------------------------------------------------------------------------- Wound Assessment Details Patient Name: [name] [name] Date of Service: 06/04/2015 1:30 PM Medical Record Number: [id number] Patient Account Number: [id number] Date of Birth/Sex: Feb 07, 1927 (82 y.o. Male) Treating RN: Macarthur Critchley Primary Care Physician: Alvester Chou Other Clinician: Referring Physician: Alvester Chou Treating Physician/Extender: Frann Rider in Treatment: 8 Wound Status Wound Number: 1 Primary Lymphedema Etiology: Wound Location: Right Lower Leg - Circumfernential Wound Open Status: Wounding Event: Gradually Appeared Comorbid Congestive Heart Failure, Date Acquired: 12/28/2014 History: Hypertension, Type II Diabetes, Weeks Of Treatment: 8 Neuropathy Clustered Wound: Yes Photos Photo Uploaded By: Rebecca Eaton, RN, Roslynn Amble on 06/04/2015 16:28:58 Wound Measurements Length: (cm) 23 Width: (cm) 28 Depth: (cm) 0.1 Area: (cm)  505.796 Volume: (cm) 50.58 % Reduction in Area: -30.4% % Reduction in Volume: -30.4% Epithelialization: Medium (34-66%) Tunneling: No Undermining: No Wound Description Classification: Partial Thickness Diabetic Severity (Wagner): Grade 0 Wound Margin: Indistinct, nonvisible Exudate Amount: Large Exudate Type: Serosanguineous Exudate Color: red, brown Foul Odor After Cleansing: No Wound Bed Granulation Amount: Large (67-100%) Exposed Structure Granulation Quality: Pink, Pale Fascia Exposed: No Yannuzzi, Javion ([id number]) Necrotic Amount: Small (1-33%) Fat Layer Exposed: No Necrotic Quality: Adherent Slough Tendon Exposed: No Muscle Exposed: No Joint Exposed: No Bone Exposed: No Limited to Skin Breakdown Periwound Skin Texture Texture Color No Abnormalities Noted: No No Abnormalities Noted: No Callus: No Atrophie Blanche: No Crepitus: No Cyanosis: No Excoriation: No Ecchymosis: No Fluctuance: No Erythema: No Friable: No Hemosiderin Staining: No Induration: No Mottled: No Localized Edema: No Pallor: No Rash: No Rubor: No Scarring: No Temperature / Pain Moisture Temperature: No Abnormality No Abnormalities Noted: No Tenderness on Palpation: Yes Dry / Scaly: No Maceration: Yes Moist: Yes Wound Preparation Ulcer Cleansing: Other: soap AND WATER, Topical Anesthetic Applied: None, Other: lidocaine 4%, Treatment Notes Wound #1 (Right, Circumferential Lower Leg) 1. Cleansed with: Cleanse wound with antibacterial soap and water 4. Dressing Applied: Aquacel Ag 5. Secondary Dressing Applied ABD Pad Dry Gauze 7. Secured with Water engineer) Signed: 06/04/2015 4:31:21 PM By: Rebecca Eaton, RN, Sendra Entered By: Rebecca Eaton, RN, Sendra on 06/04/2015 13:54:30 [name] [name] ([id number]) -------------------------------------------------------------------------------- Wound Assessment Details Patient Name: [name] [name] Date of Service:  06/04/2015 1:30 PM Medical Record Number: [id number] Patient Account Number: [id number] Date of Birth/Sex: 01-Jul-1926 (82 y.o. Male) Treating RN: Macarthur Critchley Primary Care Physician: Alvester Chou Other Clinician: Referring Physician: Alvester Chou Treating Physician/Extender: Frann Rider in Treatment: 8 Wound Status Wound Number: 2 Primary Lymphedema Etiology: Wound Location: Left Lower Leg - Circumfernential Wound Open Status: Wounding Event: Gradually Appeared Comorbid Congestive Heart Failure, Date Acquired: 12/28/2014 History: Hypertension, Type II Diabetes, Weeks Of Treatment: 8 Neuropathy Clustered Wound: Yes Photos Photo Uploaded By: Rebecca Eaton, RN, Roslynn Amble on 06/04/2015 16:29:17 Wound Measurements Length: (cm) 25 Width: (cm) 41 Depth: (cm) 0.1 Area: (cm) 805.033 Volume: (cm) 80.503 % Reduction in Area: -205.1% % Reduction in Volume: -205.1% Epithelialization: Medium (34-66%) Tunneling: No Undermining: No Wound Description Classification: Partial Thickness Diabetic Severity (Wagner): Grade 1 Wound Margin: Indistinct, nonvisibl Exudate Amount: Large Exudate Type: Serosanguineous Exudate Color: red, brown Foul Odor After Cleansing: Yes Due to Product Use: No e Wound Bed Granulation Amount: Large (67-100%) Exposed Structure Granulation Quality: Red, Pink, Hyper-granulation Fascia Exposed: No Breault, Eber ([id number]) Necrotic Amount: Small (1-33%) Fat Layer Exposed: No Necrotic Quality: Adherent Slough Tendon Exposed: No Muscle Exposed: No Joint Exposed: No Bone Exposed: No Limited to Skin  Breakdown Periwound Skin Texture Texture Color No Abnormalities Noted: No No Abnormalities Noted: No Callus: No Atrophie Blanche: No Crepitus: No Cyanosis: No Excoriation: No Ecchymosis: No Fluctuance: No Erythema: No Friable: No Hemosiderin Staining: No Induration: No Mottled: No Localized Edema: Yes Pallor: No Rash: No Rubor: No Scarring: No  Temperature / Pain Moisture Temperature: No Abnormality No Abnormalities Noted: No Tenderness on Palpation: Yes Dry / Scaly: No Maceration: Yes Moist: Yes Wound Preparation Ulcer Cleansing: Other: soap and water, Topical Anesthetic Applied: None, Other: lidocaine 4%, Treatment Notes Wound #2 (Left, Circumferential Lower Leg) 1. Cleansed with: Cleanse wound with antibacterial soap and water 4. Dressing Applied: Aquacel Ag 5. Secondary Dressing Applied ABD Pad Dry Gauze 7. Secured with Water engineer) Signed: 06/04/2015 4:31:21 PM By: Rebecca Eaton, RN, Sendra Entered By: Rebecca Eaton, RN, Sendra on 06/04/2015 13:56:00 [name] [name] ([id number]) -------------------------------------------------------------------------------- Wound Assessment Details Patient Name: [name] [name] Date of Service: 06/04/2015 1:30 PM Medical Record Number: [id number] Patient Account Number: [id number] Date of Birth/Sex: 17-Mar-1927 (82 y.o. Male) Treating RN: Macarthur Critchley Primary Care Physician: Alvester Chou Other Clinician: Referring Physician: Alvester Chou Treating Physician/Extender: Frann Rider in Treatment: 8 Wound Status Wound Number: 3 Primary Lymphedema Etiology: Wound Location: Left Foot - Plantar Wound Open Wounding Event: Gradually Appeared Status: Date Acquired: 01/27/2015 Comorbid Congestive Heart Failure, Weeks Of Treatment: 8 History: Hypertension, Type II Diabetes, Clustered Wound: No Neuropathy Photos Photo Uploaded By: Rebecca Eaton, RN, Roslynn Amble on 06/04/2015 16:25:08 Wound Measurements Length: (cm) 1 Width: (cm) 2 Depth: (cm) 0.1 Area: (cm) 1.571 Volume: (cm) 0.157 % Reduction in Area: 79.2% % Reduction in Volume: 79.2% Epithelialization: Small (1-33%) Tunneling: No Undermining: No Wound Description Classification: Partial Thickness Diabetic Severity Earleen Newport): Grade 0 Wound Margin: Indistinct, nonvisibl Exudate Amount: Medium Exudate  Type: Serous Exudate Color: amber Foul Odor After Cleansing: No e Wound Bed Granulation Amount: Large (67-100%) Exposed Structure Granulation Quality: Pink Fascia Exposed: No Necrotic Amount: Small (1-33%) Fat Layer Exposed: No Folse, Toan ([id number]) Necrotic Quality: Adherent Slough Tendon Exposed: No Muscle Exposed: No Joint Exposed: No Bone Exposed: No Limited to Skin Breakdown Periwound Skin Texture Texture Color No Abnormalities Noted: No No Abnormalities Noted: No Callus: No Atrophie Blanche: No Crepitus: No Cyanosis: No Excoriation: No Ecchymosis: No Fluctuance: No Erythema: No Friable: No Hemosiderin Staining: No Induration: No Mottled: No Localized Edema: No Pallor: No Rash: No Rubor: No Scarring: No Temperature / Pain Moisture Temperature: No Abnormality No Abnormalities Noted: No Dry / Scaly: No Maceration: Yes Moist: Yes Wound Preparation Ulcer Cleansing: Other: water and soap, Topical Anesthetic Applied: Other: lidocaine 4%, Treatment Notes Wound #3 (Left, Plantar Foot) 1. Cleansed with: Cleanse wound with antibacterial soap and water 4. Dressing Applied: Aquacel Ag 5. Secondary Dressing Applied ABD Pad Dry Gauze 7. Secured with Water engineer) Signed: 06/04/2015 4:31:21 PM By: Rebecca Eaton, RN, Sendra Entered By: Rebecca Eaton RN, Sendra on 06/04/2015 13:53:08 [name] [name] ([id number]) -------------------------------------------------------------------------------- [place]cifer Details Patient Name: [name] [name] Date of Service: 06/04/2015 1:30 PM Medical Record Number: [id number] Patient Account Number: [id number] Date of Birth/Sex: 1927/03/21 (82 y.o. Male) Treating RN: Macarthur Critchley Primary Care Physician: Alvester Chou Other Clinician: Referring Physician: Alvester Chou Treating Physician/Extender: Frann Rider in Treatment: 8 Vital Signs Time Taken: 13:38 Temperature (F): 97.8 Height (in): 70 Pulse  (bpm): 84 Weight (lbs): 230 Blood Pressure (mmHg): 127/31 Body Mass Index (BMI): 33 Reference Range: 80 - 120 mg / dl Electronic Signature(s) Signed: 06/04/2015 4:31:21 PM By: Rebecca Eaton, RN, Sendra Entered By: Rebecca Eaton, RN,  Sendra on 06/04/2015 13:40:40

## 2015-06-05 NOTE — Progress Notes (Signed)
[name] [name] ([id number]) Visit Report for 06/04/2015 Chief Complaint Document Details Patient Name: [name] [name], [name] [name] Date of Service: 06/04/2015 1:30 PM Medical Record Number: [id number] Patient Account Number: [id number] Date of Birth/Sex: 03/10/1927 (82 y.o. Male) Treating RN: Macarthur Critchley Primary Care Physician: Alvester Chou Other Clinician: Referring Physician: Alvester Chou Treating Physician/Extender: Frann Rider in Treatment: 8 Information Obtained from: Patient Chief Complaint Patient presents to the wound care center for a consult due non healing wound both lower extremities. The patient is a poor historian and was seen here once in July 2016. He says home health was doing his dressings and he was healed but now has recurrence of the problem. Problem has been there at least for 6 months. Electronic Signature(s) Signed: 06/04/2015 2:12:50 PM By: Christin Fudge MD, FACS Entered By: Christin Fudge on 06/04/2015 14:12:50 [name] [name] ([id number]) -------------------------------------------------------------------------------- HPI Details Patient Name: [name] [name] Date of Service: 06/04/2015 1:30 PM Medical Record Number: [id number] Patient Account Number: [id number] Date of Birth/Sex: [birth year] (82 y.o. Male) Treating RN: Macarthur Critchley Primary Care Physician: Alvester Chou Other Clinician: Referring Physician: Alvester Chou Treating Physician/Extender: Frann Rider in Treatment: 8 History of Present Illness Location: swelling of the legs right more than left an ulceration both lower extremities Quality: Patient reports experiencing a dull pain to affected area(s). Severity: Patient states wound (s) are getting worse Duration: Patient has had the wound for > 6 months prior to seeking treatment at the wound center Timing: Pain in wound is Intermittent. Context: The wound appeared gradually over time Modifying Factors: he has been having home health come and apply his dressing  daily was healed. Associated Signs and Symptoms: Patient reports having difficulty standing for long periods. HPI Description: [name] [name] is a 82 y.o. male with history of DM, hypertension, chronic kidney disease, gout, diastolic dysfunction and the patient was having increasing swelling with increasing fluid draining from the legs and pain over the last 6 weeks. He says his wounds are completely healed and the home health had stopped coming to do his dressings. Recent hemoglobin A1c was 6.6. During the last admission A venous duplex study revealed no evidence of deep vein thrombosis or superficial thrombosis involving the right and left lower extremity. Was seen in January 2016 by Dr. Adele Barthel at [facility]: Non-Invasive Vascular Imaging ABI (05/05/2014) o R: 0.65 (0.73), DP: mono, PT: mono, TBI: 0.57 o L: 0.65 (0.69), DP: mono, PT: mono, TBI: 0.36 The patient had the diagnosis of bilateral lower extremity moderate peripheral atherosclerotic disease, bilateral lower extremity chronic venous insufficiency. Due to the patient's age and mental status conservative therapy was only discussed and no surgical intervention was planned. The patient has significant dementia and has a caregiver who spends a few hours every day coming to see him. He does not have any family members. 05/21/2015 -- lower extremity arterial studies done on 05/17/2015 show the right ABI is 0.50-0.79 indicating moderate arterial occlusive disease at rest. The left ABI was 0.5-0.79 indicating moderate arterial occlusive disease at rest. The left posterior tibial and peroneal arteries were not evaluated due to local wound condition. The right TBI was less than 0.69 and the left ABI was less than 0.69 which are both abnormal. Since it's been a year since the previous opinion from Dr. Bridgett Larsson I have asked him to see him for a review. 05/28/2015 -- was seen by Dr. Bridgett Larsson and he recommended that he was not a good candidate for surgery and  surgical revascularization if the need develops. He  recommends to continue with compressive therapy and wound care. 06/04/2015 -- had asked him to remove his compression wraps have a shower and come to see as but he has probably been scratching his legs and caused a lot of excoriation and has multiple open ulcers which [name] [name], [name] [name] ([id number]) are much more worse than before. The patient is not very compliant and may be this is because of his dementia. Electronic Signature(s) Signed: 06/04/2015 2:13:44 PM By: Christin Fudge MD, FACS Entered By: Christin Fudge on 06/04/2015 14:13:44 [name] [name] ([id number]) -------------------------------------------------------------------------------- Physical Exam Details Patient Name: [name] [name] Date of Service: 06/04/2015 1:30 PM Medical Record Number: [id number] Patient Account Number: [id number] Date of Birth/Sex: 1926-12-23 (82 y.o. Male) Treating RN: Macarthur Critchley Primary Care Physician: Alvester Chou Other Clinician: Referring Physician: Alvester Chou Treating Physician/Extender: Frann Rider in Treatment: 8 Constitutional . Pulse regular. Respirations normal and unlabored. Afebrile. . Eyes Nonicteric. Reactive to light. Ears, Nose, Mouth, and Throat Lips, teeth, and gums WNL.Marland Kitchen Moist mucosa without lesions. Neck supple and nontender. No palpable supraclavicular or cervical adenopathy. Normal sized without goiter. Respiratory WNL. No retractions.. Cardiovascular Pedal Pulses WNL. No clubbing, cyanosis or edema. Lymphatic No adneopathy. No adenopathy. No adenopathy. Musculoskeletal Adexa without tenderness or enlargement.. Digits and nails w/o clubbing, cyanosis, infection, petechiae, ischemia, or inflammatory conditions.. Integumentary (Hair, Skin) No suspicious lesions. No crepitus or fluctuance. No peri-wound warmth or erythema. No masses.Marland Kitchen Psychiatric Judgement and insight Intact.. No evidence of depression, anxiety, or  agitation.. Notes this week the ulceration has gotten much worse due to him scratching this area significantly. There is excoriation much worse on the left leg than the right leg. Electronic Signature(s) Signed: 06/04/2015 2:14:21 PM By: Christin Fudge MD, FACS Entered By: Christin Fudge on 06/04/2015 14:14:20 [name] [name] ([id number]) -------------------------------------------------------------------------------- Physician Orders Details Patient Name: [name] [name] Date of Service: 06/04/2015 1:30 PM Medical Record Number: [id number] Patient Account Number: [id number] Date of Birth/Sex: 30-Mar-1927 (82 y.o. Male) Treating RN: Macarthur Critchley Primary Care Physician: Alvester Chou Other Clinician: Referring Physician: Alvester Chou Treating Physician/Extender: Frann Rider in Treatment: 8 Verbal / Phone Orders: Yes Clinician: Macarthur Critchley Read Back and Verified: Yes Diagnosis Coding Wound Cleansing Wound #1 Right,Circumferential Lower Leg o May shower with protection. Wound #2 Left,Circumferential Lower Leg o May shower with protection. Wound #3 Left,Plantar Foot o May shower with protection. Primary Wound Dressing Wound #1 Right,Circumferential Lower Leg o Aquacel Ag Wound #2 Left,Circumferential Lower Leg o Aquacel Ag Wound #3 Left,Plantar Foot o Aquacel Ag Secondary Dressing Wound #1 Right,Circumferential Lower Leg o ABD pad o Dry Gauze Wound #2 Left,Circumferential Lower Leg o ABD pad o Dry Gauze Wound #3 Left,Plantar Foot o ABD pad o Dry Gauze Dressing Change Frequency Wound #1 Right,Circumferential Lower Leg o Three times weekly [name] [name], [name] [name] ([id number]) Wound #2 Left,Circumferential Lower Leg o Three times weekly Wound #3 Left,Plantar Foot o Three times weekly Follow-up Appointments Wound #1 Right,Circumferential Lower Leg o Return Appointment in 1 week. Wound #2 Left,Circumferential Lower Leg o Return Appointment in 1  week. Wound #3 Left,Plantar Foot o Return Appointment in 1 week. Edema Control Wound #1 Right,Circumferential Lower Leg o Unna Boots Bilaterally Wound #2 Left,Circumferential Lower Leg o Unna Boots Bilaterally Wound #3 Left,Plantar Foot o Unna Boots Bilaterally Home Health Wound #1 Right,Circumferential Lower Leg o [place] Visits [facility]ajean Man[facility] please order Juzo compression wraps for patient. Measurements as follows: L ankle - 27cm, calf - 38cm; R ankle - 28cm, 37cm; heel to knee length  [place] Nurse may visit PRN to address patientos wound care needs. o FACE TO FACE ENCOUNTER: MEDICARE and MEDICAID PATIENTS: I certify that this patient is under my care and that I had a face-to-face encounter that meets the physician face-to-face encounter requirements with this patient on this date. The encounter with the patient was in whole or in part for the following MEDICAL CONDITION: (primary reason for [place]) MEDICAL NECESSITY: I certify, that based on my findings, NURSING services are a medically necessary home health service. HOME BOUND STATUS: I certify that my clinical findings support that this patient is homebound (i.e., Due to illness or injury, pt requires aid of supportive devices such as crutches, cane, wheelchairs, walkers, the use of special transportation or the assistance of another person to leave their place of residence. There is a normal inability to leave the home and doing so requires considerable and taxing effort. Other absences are for medical reasons / religious services and are infrequent or of short duration when for other reasons). o If current dressing causes regression in wound condition, may D/C ordered dressing product/s and apply Normal Saline Moist Dressing daily until next [place] / Other MD appointment. [place] of regression in wound condition at [phone]. [name] [name], [name] [name]  ([id number]) o Please direct any NON-WOUND related issues/requests for orders to patient's [place] please order Juzo compression wraps for patient. Measurements as follows: L ankle - 27cm, calf - 38cm; R ankle - 28cm, 37cm; heel to knee length 47cm o Home Health Nurse may visit PRN to address patientos wound care needs. o FACE TO FACE ENCOUNTER: MEDICARE and MEDICAID PATIENTS: I certify that this patient is under my care and that I had a face-to-face encounter that meets the physician face-to-face encounter requirements with this patient on this date. The encounter with the patient was in whole or in part for the following MEDICAL CONDITION: (primary reason for [place]) MEDICAL NECESSITY: I certify, that based on my findings, NURSING services are a medically necessary home health service. HOME BOUND STATUS: I certify that my clinical findings support that this patient is homebound (i.e., Due to illness or injury, pt requires aid of supportive devices such as crutches, cane, wheelchairs, walkers, the use of special transportation or the assistance of another person to leave their place of residence. There is a normal inability to leave the home and doing so requires considerable and taxing effort. Other absences are for medical reasons / religious services and are infrequent or of short duration when for other reasons). o If current dressing causes regression in wound condition, may D/C ordered dressing product/s and apply Normal Saline Moist Dressing daily until next [place] / Other MD appointment. [place] of regression in wound condition at [phone]. o Please direct any NON-WOUND related issues/requests for orders to patient's Primary Care Physician Wound #2 Left,Circumferential Lower Leg o [place] please order Juzo compression wraps  for patient. Measurements as follows: L ankle - 27cm, calf - 38cm; R ankle - 28cm, 37cm; heel to knee length 47cm o Home Health Nurse may visit PRN to address patientos wound care needs. o FACE TO FACE ENCOUNTER: MEDICARE and MEDICAID PATIENTS: I certify that this patient is under my care and that I had a face-to-face encounter that meets the physician face-to-face encounter requirements with this patient on this date. The encounter  with the patient was in whole or in part for the following MEDICAL CONDITION: (primary reason for [place]) MEDICAL NECESSITY: I certify, that based on my findings, NURSING services are a medically necessary home health service. HOME BOUND STATUS: I certify that my clinical findings support that this patient is homebound (i.e., Due to illness or injury, pt requires aid of supportive devices such as crutches, cane, wheelchairs, walkers, the use of special transportation or the assistance of another person to leave their place of residence. There is a normal inability to leave the home and doing so requires considerable and taxing effort. Other absences are for medical reasons / religious services and are infrequent or of short duration when for other reasons). o If current dressing causes regression in wound condition, may D/C ordered dressing product/s and apply Normal Saline Moist Dressing daily until next [place] / Other MD appointment. [place] of regression in wound condition at [phone]. o Please direct any NON-WOUND related issues/requests for orders to patient's [place] please order Juzo compression wraps for patient. Measurements as follows: L ankle - 27cm, calf - 38cm; R ankle - 28cm, 37cm; heel to knee length 47cm o Home Health Nurse may visit PRN to address patientos wound care needs. [name] [name], [name] [name] ([id number]) o FACE TO FACE  ENCOUNTER: MEDICARE and MEDICAID PATIENTS: I certify that this patient is under my care and that I had a face-to-face encounter that meets the physician face-to-face encounter requirements with this patient on this date. The encounter with the patient was in whole or in part for the following MEDICAL CONDITION: (primary reason for [place]) MEDICAL NECESSITY: I certify, that based on my findings, NURSING services are a medically necessary home health service. HOME BOUND STATUS: I certify that my clinical findings support that this patient is homebound (i.e., Due to illness or injury, pt requires aid of supportive devices such as crutches, cane, wheelchairs, walkers, the use of special transportation or the assistance of another person to leave their place of residence. There is a normal inability to leave the home and doing so requires considerable and taxing effort. Other absences are for medical reasons / religious services and are infrequent or of short duration when for other reasons). o If current dressing causes regression in wound condition, may D/C ordered dressing product/s and apply Normal Saline Moist Dressing daily until next [place] / Other MD appointment. [place] of regression in wound condition at [phone]. o Please direct any NON-WOUND related issues/requests for orders to patient's Primary Care Physician Wound #3 [place] please order Juzo compression wraps for patient. Measurements as follows: L ankle - 27cm, calf - 38cm; R ankle - 28cm, 37cm; heel to knee length 47cm o Home Health Nurse may visit PRN to address patientos wound care needs. o FACE TO FACE ENCOUNTER: MEDICARE and MEDICAID PATIENTS: I certify that this patient is under my care and that I had a face-to-face encounter that meets the physician face-to-face encounter requirements with this patient on this  date. The encounter with the patient was in whole or in part for the following MEDICAL CONDITION: (primary reason for [place]) MEDICAL NECESSITY: I certify, that based on my findings, NURSING services are a medically necessary home health service. HOME BOUND STATUS: I certify that my clinical findings support that this patient is homebound (i.e., Due to  illness or injury, pt requires aid of supportive devices such as crutches, cane, wheelchairs, walkers, the use of special transportation or the assistance of another person to leave their place of residence. There is a normal inability to leave the home and doing so requires considerable and taxing effort. Other absences are for medical reasons / religious services and are infrequent or of short duration when for other reasons). o If current dressing causes regression in wound condition, may D/C ordered dressing product/s and apply Normal Saline Moist Dressing daily until next [place] / Other MD appointment. [place] of regression in wound condition at [phone]. o Please direct any NON-WOUND related issues/requests for orders to patient's [place] please order Juzo compression wraps for patient. Measurements as follows: L ankle - 27cm, calf - 38cm; R ankle - 28cm, 37cm; heel to knee length 47cm o Home Health Nurse may visit PRN to address patientos wound care needs. o FACE TO FACE ENCOUNTER: MEDICARE and MEDICAID PATIENTS: I certify that this patient is under my care and that I had a face-to-face encounter that meets the physician face-to-face encounter requirements with this patient on this date. The encounter with the patient was in whole or in part for the following MEDICAL CONDITION: (primary reason for [place]) MEDICAL NECESSITY: I certify, that based on my findings, NURSING services are a medically necessary home  health service. HOME BOUND STATUS: I certify that my clinical findings [name] [name], [name] [name] ([id number]) support that this patient is homebound (i.e., Due to illness or injury, pt requires aid of supportive devices such as crutches, cane, wheelchairs, walkers, the use of special transportation or the assistance of another person to leave their place of residence. There is a normal inability to leave the home and doing so requires considerable and taxing effort. Other absences are for medical reasons / religious services and are infrequent or of short duration when for other reasons). o If current dressing causes regression in wound condition, may D/C ordered dressing product/s and apply Normal Saline Moist Dressing daily until next [place] / Other MD appointment. [place] of regression in wound condition at [phone]. o Please direct any NON-WOUND related issues/requests for orders to patient's Primary Care Physician Electronic Signature(s) Signed: 06/04/2015 4:17:59 PM By: Christin Fudge MD, FACS Signed: 06/04/2015 4:31:21 PM By: Rebecca Eaton RN, [place] Entered By: Rebecca Eaton RN, [place] on 06/04/2015 14:07:26 [name] [name] ([id number]) -------------------------------------------------------------------------------- Problem List Details Patient Name: [name] [name] Date of Service: 06/04/2015 1:30 PM Medical Record Number: [id number] Patient Account Number: [id number] Date of Birth/Sex: 11-18-1926 (82 y.o. Male) Treating RN: Macarthur Critchley Primary Care Physician: Alvester Chou Other Clinician: Referring Physician: Alvester Chou Treating Physician/Extender: Frann Rider in Treatment: 8 Active Problems ICD-10 Encounter Code Description Active Date Diagnosis E11.622 Type 2 diabetes mellitus with other skin ulcer 04/09/2015 Yes I89.0 Lymphedema, not elsewhere classified 04/09/2015 Yes I70.238 Atherosclerosis of native arteries of right leg with 04/09/2015  Yes ulceration of other part of lower right leg I70.248 Atherosclerosis of native arteries of left leg with ulceration 04/09/2015 Yes of other part of lower left leg I87.331 Chronic venous hypertension (idiopathic) with ulcer and 04/09/2015 Yes inflammation of right lower extremity I87.332 Chronic venous hypertension (idiopathic) with ulcer and 04/09/2015 Yes inflammation of left lower extremity Inactive Problems Resolved Problems Electronic Signature(s) Signed: 06/04/2015 2:12:43 PM By: Christin Fudge MD, FACS Entered By: Christin Fudge on 06/04/2015 14:12:43 [name] [name] ([id number]) --------------------------------------------------------------------------------  Progress Note Details Patient Name: [name] [name], [name] [name] Date of Service: 06/04/2015 1:30 PM Medical Record Number: [id number] Patient Account Number: [id number] Date of Birth/Sex: 1927-04-04 (82 y.o. Male) Treating RN: Macarthur Critchley Primary Care Physician: Alvester Chou Other Clinician: Referring Physician: Alvester Chou Treating Physician/Extender: Frann Rider in Treatment: 8 Subjective Chief Complaint Information obtained from Patient Patient presents to the wound care center for a consult due non healing wound both lower extremities. The patient is a poor historian and was seen here once in July 2016. He says home health was doing his dressings and he was healed but now has recurrence of the problem. Problem has been there at least for 6 months. History of Present Illness (HPI) The following HPI elements were documented for the patient's wound: Location: swelling of the legs right more than left an ulceration both lower extremities Quality: Patient reports experiencing a dull pain to affected area(s). Severity: Patient states wound (s) are getting worse Duration: Patient has had the wound for > 6 months prior to seeking treatment at the wound center Timing: Pain in wound is Intermittent. Context: The wound appeared  gradually over time Modifying Factors: he has been having home health come and apply his dressing daily was healed. Associated Signs and Symptoms: Patient reports having difficulty standing for long periods. Hazle Rosendahl is a 82 y.o. male with history of DM, hypertension, chronic kidney disease, gout, diastolic dysfunction and the patient was having increasing swelling with increasing fluid draining from the legs and pain over the last 6 weeks. He says his wounds are completely healed and the home health had stopped coming to do his dressings. Recent hemoglobin A1c was 6.6. During the last admission A venous duplex study revealed no evidence of deep vein thrombosis or superficial thrombosis involving the right and left lower extremity. Was seen in January 2016 by Dr. Adele Barthel at [facility]: Non-Invasive Vascular Imaging ABI (05/05/2014)  R: 0.65 (0.73), DP: mono, PT: mono, TBI: 0.57  L: 0.65 (0.69), DP: mono, PT: mono, TBI: 0.36 The patient had the diagnosis of bilateral lower extremity moderate peripheral atherosclerotic disease, bilateral lower extremity chronic venous insufficiency. Due to the patient's age and mental status conservative therapy was only discussed and no surgical intervention was planned. The patient has significant dementia and has a caregiver who spends a few hours every day coming to see him. He does not have any family members. 05/21/2015 -- lower extremity arterial studies done on 05/17/2015 show the right ABI is 0.50-0.79 indicating moderate arterial occlusive disease at rest. The left ABI was 0.5-0.79 indicating moderate arterial occlusive disease at rest. The left posterior tibial and peroneal arteries were not evaluated due to local wound [name] [name], [name] [name] ([id number]) condition. The right TBI was less than 0.69 and the left ABI was less than 0.69 which are both abnormal. Since it's been a year since the previous opinion from Dr. Bridgett Larsson I have asked him to see him for a  review. 05/28/2015 -- was seen by Dr. Bridgett Larsson and he recommended that he was not a good candidate for surgery and surgical revascularization if the need develops. He recommends to continue with compressive therapy and wound care. 06/04/2015 -- had asked him to remove his compression wraps have a shower and come to see as but he has probably been scratching his legs and caused a lot of excoriation and has multiple open ulcers which are much more worse than before. The patient is not very compliant and may be this is because of his  dementia. Objective Constitutional Pulse regular. Respirations normal and unlabored. Afebrile. Vitals Time Taken: 1:38 PM, Height: 70 in, Weight: 230 lbs, BMI: 33, Temperature: 97.8 F, Pulse: 84 bpm, Blood Pressure: 127/31 mmHg. Eyes Nonicteric. Reactive to light. Ears, Nose, Mouth, and Throat Lips, teeth, and gums WNL.Marland Kitchen Moist mucosa without lesions. Neck supple and nontender. No palpable supraclavicular or cervical adenopathy. Normal sized without goiter. Respiratory WNL. No retractions.. Cardiovascular Pedal Pulses WNL. No clubbing, cyanosis or edema. Lymphatic No adneopathy. No adenopathy. No adenopathy. Musculoskeletal Adexa without tenderness or enlargement.. Digits and nails w/o clubbing, cyanosis, infection, petechiae, ischemia, or inflammatory conditions.[facility] [name] [name], [name] [name] ([id number]) Judgement and insight Intact.. No evidence of depression, anxiety, or agitation.. General Notes: this week the ulceration has gotten much worse due to him scratching this area significantly. There is excoriation much worse on the left leg than the right leg. Integumentary (Hair, Skin) No suspicious lesions. No crepitus or fluctuance. No peri-wound warmth or erythema. No masses.. Wound #1 status is Open. Original cause of wound was Gradually Appeared. The wound is located on the Right,Circumferential Lower Leg. The wound measures 23cm length x 28cm width x  0.1cm depth; 505.796cm^2 area and 50.58cm^3 volume. The wound is limited to skin breakdown. There is no tunneling or undermining noted. There is a large amount of serosanguineous drainage noted. The wound margin is indistinct and nonvisible. There is large (67-100%) [name] [name], pale granulation within the wound bed. There is a small (1-33%) amount of necrotic tissue within the wound bed including Adherent Slough. The periwound skin appearance exhibited: Maceration, Moist. The periwound skin appearance did not exhibit: Callus, Crepitus, Excoriation, Fluctuance, Friable, Induration, Localized Edema, Rash, Scarring, Dry/Scaly, Atrophie Blanche, Cyanosis, Ecchymosis, Hemosiderin Staining, Mottled, Pallor, Rubor, Erythema. Periwound temperature was noted as No Abnormality. The periwound has tenderness on palpation. Wound #2 status is Open. Original cause of wound was Gradually Appeared. The wound is located on the Left,Circumferential Lower Leg. The wound measures 25cm length x 41cm width x 0.1cm depth; 805.033cm^2 area and 80.503cm^3 volume. The wound is limited to skin breakdown. There is no tunneling or undermining noted. There is a large amount of serosanguineous drainage noted. The wound margin is indistinct and nonvisible. There is large (67-100%) red, [name] [name] granulation within the wound bed. There is a small (1-33%) amount of necrotic tissue within the wound bed including Adherent Slough. The periwound skin appearance exhibited: Localized Edema, Maceration, Moist. The periwound skin appearance did not exhibit: Callus, Crepitus, Excoriation, Fluctuance, Friable, Induration, Rash, Scarring, Dry/Scaly, Atrophie Blanche, Cyanosis, Ecchymosis, Hemosiderin Staining, Mottled, Pallor, Rubor, Erythema. Periwound temperature was noted as No Abnormality. The periwound has tenderness on palpation. Wound #3 status is Open. Original cause of wound was Gradually Appeared. The wound is located on the [place]. The wound measures 1cm length x 2cm width x 0.1cm depth; 1.571cm^2 area and 0.157cm^3 volume. The wound is limited to skin breakdown. There is no tunneling or undermining noted. There is a medium amount of serous drainage noted. The wound margin is indistinct and nonvisible. There is large (67-100%) [name] [name] granulation within the wound bed. There is a small (1-33%) amount of necrotic tissue within the wound bed including Adherent Slough. The periwound skin appearance exhibited: Maceration, Moist. The periwound skin appearance did not exhibit: Callus, Crepitus, Excoriation, Fluctuance, Friable, Induration, Localized Edema, Rash, Scarring, Dry/Scaly, Atrophie Blanche, Cyanosis, Ecchymosis, Hemosiderin Staining, Mottled, Pallor, Rubor, Erythema. Periwound temperature was noted as No Abnormality. 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 Wound #2 Left,Circumferential Lower Leg: May shower with protection. Wound #3 Left,Plantar Foot: May shower with protection. Primary Wound Dressing: Wound #1 Right,Circumferential Lower Leg: Aquacel Ag Wound #2 Left,Circumferential Lower Leg: Aquacel Ag Wound #3 Left,Plantar Foot: Aquacel Ag Secondary Dressing: Wound #1 Right,Circumferential Lower Leg: ABD pad Dry Gauze Wound #2 Left,Circumferential Lower Leg: ABD pad Dry  Gauze Wound #3 Left,Plantar Foot: ABD pad Dry Gauze Dressing Change Frequency: Wound #1 Right,Circumferential Lower Leg: Three times weekly Wound #2 Left,Circumferential Lower Leg: Three times weekly Wound #3 Left,Plantar Foot: Three times weekly Follow-up Appointments: Wound #1 Right,Circumferential Lower Leg: Return Appointment in 1 week. Wound #2 Left,Circumferential Lower Leg: Return Appointment in 1 week. [name] [name], [name] [name] ([id number]) Wound #3 Left,Plantar Foot: Return Appointment in 1 week. Edema Control: Wound #1 Right,Circumferential Lower Leg: Unna Boots Bilaterally Wound #2 Left,Circumferential Lower Leg: Retail buyer Bilaterally Wound #3 Left,Plantar Foot: Retail buyer Bilaterally Home Health: Wound #1 Right,Circumferential Lower Leg: Continue Home Health Visits - [place] please order Juzo compression wraps for patient. Measurements as follows: L ankle - 27cm, calf - 38cm; R ankle - 28cm, 37cm; heel to knee length 47cm Home Health Nurse may visit PRN to address patient s wound care needs. FACE TO FACE ENCOUNTER: MEDICARE and MEDICAID PATIENTS: I certify that this patient is under my care and that I had a face-to-face encounter that meets the physician face-to-face encounter requirements with this patient on this date. The encounter with the patient was in whole or in part for the following MEDICAL CONDITION: (primary reason for [place]) MEDICAL NECESSITY: I certify, that based on my findings, NURSING services are a medically necessary home health service. HOME BOUND STATUS: I certify that my clinical findings support that this patient is homebound (i.e., Due to illness or injury, pt requires aid of supportive devices such as crutches, cane, wheelchairs, walkers, the use of special transportation or the assistance of another person to leave their place of residence. There is a normal inability to leave the home and doing so requires considerable and taxing  effort. Other absences are for medical reasons / religious services and are infrequent or of short duration when for other reasons). If current dressing causes regression in wound condition, may D/C ordered dressing product/s and apply Normal Saline Moist Dressing daily until next [place] / Other MD appointment. [place] of regression in wound condition at [phone]. Please direct any NON-WOUND related issues/requests for orders to patient's [place] please order Juzo compression wraps for patient. Measurements as follows: L ankle - 27cm, calf - 38cm; R ankle - 28cm, 37cm; heel to knee length 47cm Home Health Nurse may visit PRN to address patient s wound care needs. FACE TO FACE ENCOUNTER: MEDICARE and MEDICAID PATIENTS: I certify that this patient is under my care and that I had a face-to-face encounter that meets the physician face-to-face encounter requirements with this patient on this date. The encounter with the  patient was in whole or in part for the following MEDICAL CONDITION: (primary reason for [place]) MEDICAL NECESSITY: I certify, that based on my findings, NURSING services are a medically necessary home health service. HOME BOUND STATUS: I certify that my clinical findings support that this patient is homebound (i.e., Due to illness or injury, pt requires aid of supportive devices such as crutches, cane, wheelchairs, walkers, the use of special transportation or the assistance of another person to leave their place of residence. There is a normal inability to leave the home and doing so requires considerable and taxing effort. Other absences are for medical reasons / religious services and are infrequent or of short duration when for other reasons). If current dressing causes regression in wound condition, may D/C ordered dressing product/s and apply Normal Saline Moist Dressing  daily until next [place] / Other MD appointment. [place] of regression in wound condition at [phone]. Please direct any NON-WOUND related issues/requests for orders to patient's Primary Care Physician Wound #2 Left,Circumferential Lower Leg: [place] please order Juzo compression wraps for patient. Measurements as follows: L ankle - 27cm, calf - 38cm; R ankle - 28cm, 37cm; heel to knee length 47cm Home Health Nurse may visit PRN to address patient s wound care needs. FACE TO FACE ENCOUNTER: MEDICARE and MEDICAID PATIENTS: I certify that this patient is under my care and that I had a face-to-face encounter that meets the physician face-to-face encounter [name] [name], [name] [name] ([id number]) requirements with this patient on this date. The encounter with the patient was in whole or in part for the following MEDICAL CONDITION: (primary reason for [place]) MEDICAL NECESSITY: I certify, that based on my findings, NURSING services are a medically necessary home health service. HOME BOUND STATUS: I certify that my clinical findings support that this patient is homebound (i.e., Due to illness or injury, pt requires aid of supportive devices such as crutches, cane, wheelchairs, walkers, the use of special transportation or the assistance of another person to leave their place of residence. There is a normal inability to leave the home and doing so requires considerable and taxing effort. Other absences are for medical reasons / religious services and are infrequent or of short duration when for other reasons). If current dressing causes regression in wound condition, may D/C ordered dressing product/s and apply Normal Saline Moist Dressing daily until next [place] / Other MD appointment. [place] of regression in wound condition at [phone]. Please direct any NON-WOUND related issues/requests for  orders to patient's [place] please order Juzo compression wraps for patient. Measurements as follows: L ankle - 27cm, calf - 38cm; R ankle - 28cm, 37cm; heel to knee length 47cm Home Health Nurse may visit PRN to address patient s wound care needs. FACE TO FACE ENCOUNTER: MEDICARE and MEDICAID PATIENTS: I certify that this patient is under my care and that I had a face-to-face encounter that meets the physician face-to-face encounter requirements with this patient on this date. The encounter with the patient was in whole or in part for the following MEDICAL CONDITION: (primary reason for [place]) MEDICAL NECESSITY: I certify, that based on my findings, NURSING services are a medically necessary home health service. HOME BOUND STATUS: I certify that my clinical findings support that this patient is homebound (i.e., Due to illness or injury, pt requires aid of supportive devices  such as crutches, cane, wheelchairs, walkers, the use of special transportation or the assistance of another person to leave their place of residence. There is a normal inability to leave the home and doing so requires considerable and taxing effort. Other absences are for medical reasons / religious services and are infrequent or of short duration when for other reasons). If current dressing causes regression in wound condition, may D/C ordered dressing product/s and apply Normal Saline Moist Dressing daily until next [place] / Other MD appointment. [place] of regression in wound condition at [phone]. Please direct any NON-WOUND related issues/requests for orders to patient's Primary Care Physician Wound #3 Left,Plantar Foot: [place] please order Juzo compression wraps for patient. Measurements as follows: L ankle - 27cm, calf - 38cm; R ankle - 28cm, 37cm; heel to knee length  47cm Home Health Nurse may visit PRN to address patient s wound care needs. FACE TO FACE ENCOUNTER: MEDICARE and MEDICAID PATIENTS: I certify that this patient is under my care and that I had a face-to-face encounter that meets the physician face-to-face encounter requirements with this patient on this date. The encounter with the patient was in whole or in part for the following MEDICAL CONDITION: (primary reason for [place]) MEDICAL NECESSITY: I certify, that based on my findings, NURSING services are a medically necessary home health service. HOME BOUND STATUS: I certify that my clinical findings support that this patient is homebound (i.e., Due to illness or injury, pt requires aid of supportive devices such as crutches, cane, wheelchairs, walkers, the use of special transportation or the assistance of another person to leave their place of residence. There is a normal inability to leave the home and doing so requires considerable and taxing effort. Other absences are for medical reasons / religious services and are infrequent or of short duration when for other reasons). If current dressing causes regression in wound condition, may D/C ordered dressing product/s and apply Normal Saline Moist Dressing daily until next [place] / Other MD appointment. [place] of regression in wound condition at [phone]. Please direct any NON-WOUND related issues/requests for orders to patient's [place] please order Juzo compression wraps for patient. Measurements as follows: L ankle - 27cm, calf - 38cm; R ankle - 28cm, 37cm; heel to knee length 47cm Home Health Nurse may visit PRN to address patient s wound care needs. FACE TO FACE ENCOUNTER: MEDICARE and MEDICAID PATIENTS: I certify that this patient is under [name] [name], [name] [name] ([id number]) my care and that I had a face-to-face encounter that meets the  physician face-to-face encounter requirements with this patient on this date. The encounter with the patient was in whole or in part for the following MEDICAL CONDITION: (primary reason for [place]) MEDICAL NECESSITY: I certify, that based on my findings, NURSING services are a medically necessary home health service. HOME BOUND STATUS: I certify that my clinical findings support that this patient is homebound (i.e., Due to illness or injury, pt requires aid of supportive devices such as crutches, cane, wheelchairs, walkers, the use of special transportation or the assistance of another person to leave their place of residence. There is a normal inability to leave the home and doing so requires considerable and taxing effort. Other absences are for medical reasons / religious services and are infrequent or of short duration when for other reasons). If current dressing  causes regression in wound condition, may D/C ordered dressing product/s and apply Normal Saline Moist Dressing daily until next [place] / Other MD appointment. [place] of regression in wound condition at [phone]. Please direct any NON-WOUND related issues/requests for orders to patient's Primary Care Physician I have recommended silver alginate and Unna's boots to be applied and changed twice weekly. I have also recommended elevation and exercise and will order him juzo compression stockings with the Velcro wrap. he will come back and see as an regular basis Electronic Signature(s) Signed: 06/04/2015 2:14:57 PM By: Christin Fudge MD, FACS Entered By: Christin Fudge on 06/04/2015 14:14:57 [name] [name] ([id number]) -------------------------------------------------------------------------------- SuperBill Details Patient Name: [name] [name] Date of Service: 06/04/2015 Medical Record Number: [id number] Patient Account Number: [id number] Date of Birth/Sex: Aug 02, 1926 (82 y.o. 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 I70.248 Atherosclerosis of native arteries of left leg with ulceration of other part of lower left leg Chronic venous hypertension (idiopathic) with ulcer and inflammation of right lower I87.331 extremity Chronic venous hypertension (idiopathic) with ulcer and inflammation of left lower I87.332 extremity Facility Procedures CPT4 Code: [id number] Description: (Facility Use Only) [id number] - APPLY UNNA BOOT LT Modifier: Quantity: 1 CPT4 Code: [id number] Description: (Facility Use Only) [id number] - APPLY UNNA BOOT RT Modifier: Quantity: 1 Physician Procedures CPT4: Description Modifier Quantity Code [id number] - WC PHYS LEVEL 3 - EST PT 1 ICD-10 Description Diagnosis E11.622 Type 2 diabetes mellitus with other skin ulcer I89.0 Lymphedema, not elsewhere classified I70.248 Atherosclerosis of native arteries  of left leg with ulceration of other part of lower left leg I87.332 Chronic venous hypertension (idiopathic) with ulcer and inflammation of left lower extremity Electronic Signature(s) Signed: 06/04/2015 4:17:59 PM By: Christin Fudge MD, FACS Signed: 06/04/2015 4:31:21 PM By: Rebecca Eaton RN, [place] Previous Signature: 06/04/2015 2:15:21 PM Version By: Christin Fudge MD, FACS [name] [name] ([id number]) Entered By: Ardean Larsen on 06/04/2015 15:16:39

## 2015-06-11 ENCOUNTER — Encounter: Payer: Medicare Other | Admitting: Surgery

## 2015-06-11 DIAGNOSIS — E11622 Type 2 diabetes mellitus with other skin ulcer: Secondary | ICD-10-CM | POA: Diagnosis not present

## 2015-06-12 NOTE — Progress Notes (Addendum)
DEAARON, PAYANT ([id number]) Visit Report for 06/11/2015 Chief Complaint Document Details Patient Name: MIQUEL, VESSEY Date of Service: 06/11/2015 1:30 PM Medical Record Number: [id number] Patient Account Number: [id number] Date of Birth/Sex: 1927/03/05 (82 y.o. Male) Treating RN: Macarthur Critchley Primary Care Physician: Alvester Chou Other Clinician: Referring Physician: Alvester Chou Treating Physician/Extender: Frann Rider in Treatment: 9 Information Obtained from: Patient Chief Complaint Patient presents to the wound care center for a consult due non healing wound both lower extremities. The patient is a poor historian and was seen here once in July 2016. He says home health was doing his dressings and he was healed but now has recurrence of the problem. Problem has been there at least for 6 months. Electronic Signature(s) Signed: 06/11/2015 2:14:41 PM By: Christin Fudge MD, FACS Entered By: Christin Fudge on 06/11/2015 14:14:41 [name] ([id number]) -------------------------------------------------------------------------------- HPI Details Patient Name: [name] Date of Service: 06/11/2015 1:30 PM Medical Record Number: [id number] Patient Account Number: [id number] Date of Birth/Sex: 1927-02-16 (82 y.o. Male) Treating RN: Macarthur Critchley Primary Care Physician: Alvester Chou Other Clinician: Referring Physician: Alvester Chou Treating Physician/Extender: Frann Rider in Treatment: 9 History of Present Illness Location: swelling of the legs right more than left an ulceration both lower extremities Quality: Patient reports experiencing a dull pain to affected area(s). Severity: Patient states wound (s) are getting worse Duration: Patient has had the wound for > 6 months prior to seeking treatment at the wound center Timing: Pain in wound is Intermittent. Context: The wound appeared gradually over time Modifying Factors: he has been having home health come and apply his  dressing daily was healed. Associated Signs and Symptoms: Patient reports having difficulty standing for long periods. HPI Description: Horton Rhodes is a 82 y.o. male with history of DM, hypertension, chronic kidney disease, gout, diastolic dysfunction and the patient was having increasing swelling with increasing fluid draining from the legs and pain over the last 6 weeks. He says his wounds are completely healed and the home health had stopped coming to do his dressings. Recent hemoglobin A1c was 6.6. During the last admission A venous duplex study revealed no evidence of deep vein thrombosis or superficial thrombosis involving the right and left lower extremity. Was seen in January 2016 by Dr. Adele Barthel at [facility]: Non-Invasive Vascular Imaging ABI (05/05/2014) o R: 0.65 (0.73), DP: mono, PT: mono, TBI: 0.57 o L: 0.65 (0.69), DP: mono, PT: mono, TBI: 0.36 The patient had the diagnosis of bilateral lower extremity moderate peripheral atherosclerotic disease, bilateral lower extremity chronic venous insufficiency. Due to the patient's age and mental status conservative therapy was only discussed and no surgical intervention was planned. The patient has significant dementia and has a caregiver who spends a few hours every day coming to see him. He does not have any family members. 05/21/2015 -- lower extremity arterial studies done on 05/17/2015 show the right ABI is 0.50-0.79 indicating moderate arterial occlusive disease at rest. The left ABI was 0.5-0.79 indicating moderate arterial occlusive disease at rest. The left posterior tibial and peroneal arteries were not evaluated due to local wound condition. The right TBI was less than 0.69 and the left ABI was less than 0.69 which are both abnormal. Since it's been a year since the previous opinion from Dr. Bridgett Larsson I have asked him to see him for a review. 05/28/2015 -- was seen by Dr. Bridgett Larsson and he recommended that he was not a good candidate for  surgery and surgical revascularization if the need develops. He  recommends to continue with compressive therapy and wound care. 06/04/2015 -- had asked him to remove his compression wraps have a shower and come to see as but he has probably been scratching his legs and caused a lot of excoriation and has multiple open ulcers which Janus, Delon ([id number]) are much more worse than before. The patient is not very compliant and may be this is because of his dementia. Electronic Signature(s) Signed: 06/11/2015 2:14:55 PM By: Christin Fudge MD, FACS Entered By: Christin Fudge on 06/11/2015 14:14:55 [name] ([id number]) -------------------------------------------------------------------------------- Physical Exam Details Patient Name: [name] Date of Service: 06/11/2015 1:30 PM Medical Record Number: [id number] Patient Account Number: [id number] Date of Birth/Sex: 09/21/1926 (82 y.o. Male) Treating RN: Macarthur Critchley Primary Care Physician: Alvester Chou Other Clinician: Referring Physician: Alvester Chou Treating Physician/Extender: Frann Rider in Treatment: 9 Constitutional . Pulse regular. Respirations normal and unlabored. Afebrile. . Eyes Nonicteric. Reactive to light. Ears, Nose, Mouth, and Throat Lips, teeth, and gums WNL.Marland Kitchen Moist mucosa without lesions. Neck supple and nontender. No palpable supraclavicular or cervical adenopathy. Normal sized without goiter. Respiratory WNL. No retractions.. Cardiovascular Pedal Pulses WNL. No clubbing, cyanosis or edema. Lymphatic No adneopathy. No adenopathy. No adenopathy. Musculoskeletal Adexa without tenderness or enlargement.. Digits and nails w/o clubbing, cyanosis, infection, petechiae, ischemia, or inflammatory conditions.. Integumentary (Hair, Skin) No suspicious lesions. No crepitus or fluctuance. No peri-wound warmth or erythema. No masses.Marland Kitchen Psychiatric Judgement and insight Intact.. No evidence of depression,  anxiety, or agitation.. Notes his edema has gone down a little bit and the excoriation which was there last week is significantly better. He continues to have open ulcerations both lower extremities but overall improvement is apparent. Electronic Signature(s) Signed: 06/11/2015 2:15:51 PM By: Christin Fudge MD, FACS Entered By: Christin Fudge on 06/11/2015 14:15:51 [name] ([id number]) -------------------------------------------------------------------------------- Physician Orders Details Patient Name: [name] Date of Service: 06/11/2015 1:30 PM Medical Record Number: [id number] Patient Account Number: [id number] Date of Birth/Sex: 10/09/1926 (82 y.o. Male) Treating RN: Macarthur Critchley Primary Care Physician: Alvester Chou Other Clinician: Referring Physician: Alvester Chou Treating Physician/Extender: Frann Rider in Treatment: 9 Verbal / Phone Orders: Yes Clinician: Macarthur Critchley Read Back and Verified: Yes Diagnosis Coding Wound Cleansing Wound #1 Right,Circumferential Lower Leg o May shower with protection. Wound #2 Left,Circumferential Lower Leg o May shower with protection. Wound #3 Left,Plantar Foot o May shower with protection. Primary Wound Dressing Wound #1 Right,Circumferential Lower Leg o Aquacel Ag Wound #2 Left,Circumferential Lower Leg o Aquacel Ag Wound #3 Left,Plantar Foot o Aquacel Ag Secondary Dressing Wound #1 Right,Circumferential Lower Leg o ABD pad o Dry Gauze Wound #2 Left,Circumferential Lower Leg o ABD pad o Dry Gauze Wound #3 Left,Plantar Foot o ABD pad o Dry Gauze Dressing Change Frequency Wound #1 Right,Circumferential Lower Leg o Three times weekly Jernigan, Michele ([id number]) Wound #2 Left,Circumferential Lower Leg o Three times weekly Wound #3 Left,Plantar Foot o Three times weekly Follow-up Appointments Wound #1 Right,Circumferential Lower Leg o Return Appointment in 1 week. Wound #2  Left,Circumferential Lower Leg o Return Appointment in 1 week. Wound #3 Left,Plantar Foot o Return Appointment in 1 week. Edema Control Wound #1 Right,Circumferential Lower Leg o Unna Boots Bilaterally o Other: - bring wraps that was shipped to house Wound #2 Left,Circumferential Lower Leg o Unna Boots Bilaterally o Other: - bring wraps that was shipped to house Wound #3 Left,Plantar Foot o Unna Boots Bilaterally o Other: - bring wraps that was shipped to [place] #1 Right,Circumferential Lower Leg   o [place] please order Juzo compression wraps for patient. Measurements as follows: L ankle - 27cm, calf - 38cm; R ankle - 28cm, 37cm; heel to knee length 47cm o [place] please order Juzo compression wraps for patient. Measurements as follows: L ankle - 27cm, calf - 38cm; R ankle - 28cm, 37cm; heel to knee length 47cm o Home Health Nurse may visit PRN to address patientos wound care needs. o Home Health Nurse may visit PRN to address patientos wound care needs. o FACE TO FACE ENCOUNTER: MEDICARE and MEDICAID PATIENTS: I certify that this patient is under my care and that I had a face-to-face encounter that meets the physician face-to-face encounter requirements with this patient on this date. The encounter with the patient was in whole or in part for the following MEDICAL CONDITION: (primary reason for [place]) MEDICAL NECESSITY: I certify, that based on my findings, NURSING services are a medically necessary home health service. HOME BOUND STATUS: I certify that my clinical findings support that this patient is homebound (i.e., Due to illness or injury, pt requires aid of supportive devices such as crutches, cane, wheelchairs, walkers, the use of special Marcil, [name] ([id number]) transportation or the assistance of another person to leave their place of residence.  There is a normal inability to leave the home and doing so requires considerable and taxing effort. Other absences are for medical reasons / religious services and are infrequent or of short duration when for other reasons). o FACE TO FACE ENCOUNTER: MEDICARE and MEDICAID PATIENTS: I certify that this patient is under my care and that I had a face-to-face encounter that meets the physician face-to-face encounter requirements with this patient on this date. The encounter with the patient was in whole or in part for the following MEDICAL CONDITION: (primary reason for [place]) MEDICAL NECESSITY: I certify, that based on my findings, NURSING services are a medically necessary home health service. HOME BOUND STATUS: I certify that my clinical findings support that this patient is homebound (i.e., Due to illness or injury, pt requires aid of supportive devices such as crutches, cane, wheelchairs, walkers, the use of special transportation or the assistance of another person to leave their place of residence. There is a normal inability to leave the home and doing so requires considerable and taxing effort. Other absences are for medical reasons / religious services and are infrequent or of short duration when for other reasons). o If current dressing causes regression in wound condition, may D/C ordered dressing product/s and apply Normal Saline Moist Dressing daily until next [place] / Other MD appointment. [place] of regression in wound condition at [phone]. o If current dressing causes regression in wound condition, may D/C ordered dressing product/s and apply Normal Saline Moist Dressing daily until next [place] / Other MD appointment. [place] of regression in wound condition at [phone]. o Please direct any NON-WOUND related issues/requests for orders to patient's Primary Care Physician o Please  direct any NON-WOUND related issues/requests for orders to patient's Primary Care Physician Electronic Signature(s) Signed: 06/11/2015 4:26:55 PM By: Christin Fudge MD, FACS Signed: 06/11/2015 4:28:54 PM By: Rebecca Eaton RN, [place] Entered By: Rebecca Eaton RN, Sendra on 06/11/2015 14:08:20 [name] ([id number]) -------------------------------------------------------------------------------- Problem List Details Patient Name: [name] Date of Service: 06/11/2015 1:30 PM Medical Record Number: [id number] Patient Account Number: [id number] Date of Birth/Sex: [birth year] (82 y.o. Male)  Treating RN: Macarthur Critchley Primary Care Physician: Alvester Chou Other Clinician: Referring Physician: Alvester Chou Treating Physician/Extender: Frann Rider in Treatment: 9 Active Problems ICD-10 Encounter Code Description Active Date Diagnosis E11.622 Type 2 diabetes mellitus with other skin ulcer 04/09/2015 Yes I89.0 Lymphedema, not elsewhere classified 04/09/2015 Yes I70.238 Atherosclerosis of native arteries of right leg with 04/09/2015 Yes ulceration of other part of lower right leg I70.248 Atherosclerosis of native arteries of left leg with ulceration 04/09/2015 Yes of other part of lower left leg I87.331 Chronic venous hypertension (idiopathic) with ulcer and 04/09/2015 Yes inflammation of right lower extremity I87.332 Chronic venous hypertension (idiopathic) with ulcer and 04/09/2015 Yes inflammation of left lower extremity Inactive Problems Resolved Problems Electronic Signature(s) Signed: 06/11/2015 2:14:35 PM By: Christin Fudge MD, FACS Entered By: Christin Fudge on 06/11/2015 14:14:34 [name] ([id number]) -------------------------------------------------------------------------------- Progress Note Details Patient Name: [name] Date of Service: 06/11/2015 1:30 PM Medical Record Number: [id number] Patient Account Number: [id number] Date of Birth/Sex: Feb 25, 1927 (82 y.o.  Male) Treating RN: Macarthur Critchley Primary Care Physician: Alvester Chou Other Clinician: Referring Physician: Alvester Chou Treating Physician/Extender: Frann Rider in Treatment: 9 Subjective Chief Complaint Information obtained from Patient Patient presents to the wound care center for a consult due non healing wound both lower extremities. The patient is a poor historian and was seen here once in July 2016. He says home health was doing his dressings and he was healed but now has recurrence of the problem. Problem has been there at least for 6 months. History of Present Illness (HPI) The following HPI elements were documented for the patient's wound: Location: swelling of the legs right more than left an ulceration both lower extremities Quality: Patient reports experiencing a dull pain to affected area(s). Severity: Patient states wound (s) are getting worse Duration: Patient has had the wound for > 6 months prior to seeking treatment at the wound center Timing: Pain in wound is Intermittent. Context: The wound appeared gradually over time Modifying Factors: he has been having home health come and apply his dressing daily was healed. Associated Signs and Symptoms: Patient reports having difficulty standing for long periods. Escher Certo is a 82 y.o. male with history of DM, hypertension, chronic kidney disease, gout, diastolic dysfunction and the patient was having increasing swelling with increasing fluid draining from the legs and pain over the last 6 weeks. He says his wounds are completely healed and the home health had stopped coming to do his dressings. Recent hemoglobin A1c was 6.6. During the last admission A venous duplex study revealed no evidence of deep vein thrombosis or superficial thrombosis involving the right and left lower extremity. Was seen in January 2016 by Dr. Adele Barthel at [facility]: Non-Invasive Vascular Imaging ABI (05/05/2014)  R: 0.65 (0.73), DP: mono,  PT: mono, TBI: 0.57  L: 0.65 (0.69), DP: mono, PT: mono, TBI: 0.36 The patient had the diagnosis of bilateral lower extremity moderate peripheral atherosclerotic disease, bilateral lower extremity chronic venous insufficiency. Due to the patient's age and mental status conservative therapy was only discussed and no surgical intervention was planned. The patient has significant dementia and has a caregiver who spends a few hours every day coming to see him. He does not have any family members. 05/21/2015 -- lower extremity arterial studies done on 05/17/2015 show the right ABI is 0.50-0.79 indicating moderate arterial occlusive disease at rest. The left ABI was 0.5-0.79 indicating moderate arterial occlusive disease at rest. The left posterior tibial and peroneal arteries were not  evaluated due to local wound Wivell, Justis ([id number]) condition. The right TBI was less than 0.69 and the left ABI was less than 0.69 which are both abnormal. Since it's been a year since the previous opinion from Dr. Bridgett Larsson I have asked him to see him for a review. 05/28/2015 -- was seen by Dr. Bridgett Larsson and he recommended that he was not a good candidate for surgery and surgical revascularization if the need develops. He recommends to continue with compressive therapy and wound care. 06/04/2015 -- had asked him to remove his compression wraps have a shower and come to see as but he has probably been scratching his legs and caused a lot of excoriation and has multiple open ulcers which are much more worse than before. The patient is not very compliant and may be this is because of his dementia. Objective Constitutional Pulse regular. Respirations normal and unlabored. Afebrile. Vitals Time Taken: 1:45 PM, Height: 70 in, Weight: 230 lbs, BMI: 33, Temperature: 97.8 F, Pulse: 73 bpm, Blood Pressure: 118/95 mmHg. Eyes Nonicteric. Reactive to light. Ears, Nose, Mouth, and Throat Lips, teeth, and gums WNL.Marland Kitchen Moist mucosa  without lesions. Neck supple and nontender. No palpable supraclavicular or cervical adenopathy. Normal sized without goiter. Respiratory WNL. No retractions.. Cardiovascular Pedal Pulses WNL. No clubbing, cyanosis or edema. Lymphatic No adneopathy. No adenopathy. No adenopathy. Musculoskeletal Adexa without tenderness or enlargement.. Digits and nails w/o clubbing, cyanosis, infection, petechiae, ischemia, or inflammatory conditions.Marland Kitchen Psychiatric VISHRUTH, TUMOLO ([id number]) Judgement and insight Intact.. No evidence of depression, anxiety, or agitation.. General Notes: his edema has gone down a little bit and the excoriation which was there last week is significantly better. He continues to have open ulcerations both lower extremities but overall improvement is apparent. Integumentary (Hair, Skin) No suspicious lesions. No crepitus or fluctuance. No peri-wound warmth or erythema. No masses.. Wound #1 status is Open. Original cause of wound was Gradually Appeared. The wound is located on the Right,Circumferential Lower Leg. The wound measures 27cm length x 14cm width x 0.1cm depth; 296.881cm^2 area and 29.688cm^3 volume. The wound is limited to skin breakdown. There is no tunneling or undermining noted. There is a large amount of serosanguineous drainage noted. The wound margin is indistinct and nonvisible. There is large (67-100%) pink, pale granulation within the wound bed. There is a small (1-33%) amount of necrotic tissue within the wound bed including Adherent Slough. The periwound skin appearance exhibited: Maceration, Moist. The periwound skin appearance did not exhibit: Callus, Crepitus, Excoriation, Fluctuance, Friable, Induration, Localized Edema, Rash, Scarring, Dry/Scaly, Atrophie Blanche, Cyanosis, Ecchymosis, Hemosiderin Staining, Mottled, Pallor, Rubor, Erythema. Periwound temperature was noted as No Abnormality. The periwound has tenderness on palpation. Wound #2 status is  Open. Original cause of wound was Gradually Appeared. The wound is located on the Left,Circumferential Lower Leg. The wound measures 31cm length x 18cm width x 0.1cm depth; 438.252cm^2 area and 43.825cm^3 volume. The wound is limited to skin breakdown. There is no tunneling or undermining noted. There is a large amount of serosanguineous drainage noted. The wound margin is indistinct and nonvisible. There is large (67-100%) red, pink granulation within the wound bed. There is a small (1-33%) amount of necrotic tissue within the wound bed including Adherent Slough. The periwound skin appearance exhibited: Localized Edema, Maceration, Moist. The periwound skin appearance did not exhibit: Callus, Crepitus, Excoriation, Fluctuance, Friable, Induration, Rash, Scarring, Dry/Scaly, Atrophie Blanche, Cyanosis, Ecchymosis, Hemosiderin Staining, Mottled, Pallor, Rubor, Erythema. Periwound temperature was noted as No Abnormality. The periwound has tenderness on  palpation. Wound #3 status is Open. Original cause of wound was Gradually Appeared. The wound is located on the [place]. The wound measures 0.5cm length x 3cm width x 0.1cm depth; 1.178cm^2 area and 0.118cm^3 volume. The wound is limited to skin breakdown. There is no tunneling or undermining noted. There is a medium amount of serous drainage noted. The wound margin is indistinct and nonvisible. There is large (67-100%) pink granulation within the wound bed. There is a small (1-33%) amount of necrotic tissue within the wound bed including Adherent Slough. The periwound skin appearance exhibited: Maceration, Moist. The periwound skin appearance did not exhibit: Callus, Crepitus, Excoriation, Fluctuance, Friable, Induration, Localized Edema, Rash, Scarring, Dry/Scaly, Atrophie Blanche, Cyanosis, Ecchymosis, Hemosiderin Staining, Mottled, Pallor, Rubor, Erythema. Periwound temperature was noted as No Abnormality. Assessment Active  Problems KOOPER, REICHELDERFER ([id number]) ICD-10 E11.622 - Type 2 diabetes mellitus with other skin ulcer I89.0 - Lymphedema, not elsewhere classified I70.238 - Atherosclerosis of native arteries of right leg with ulceration of other part of lower right leg I70.248 - Atherosclerosis of native arteries of left leg with ulceration of other part of lower left leg I87.331 - Chronic venous hypertension (idiopathic) with ulcer and inflammation of right lower extremity I87.332 - Chronic venous hypertension (idiopathic) with ulcer and inflammation of left lower extremity Plan Wound Cleansing: Wound #1 Right,Circumferential Lower Leg: May shower with protection. Wound #2 Left,Circumferential Lower Leg: May shower with protection. Wound #3 Left,Plantar Foot: May shower with protection. Primary Wound Dressing: Wound #1 Right,Circumferential Lower Leg: Aquacel Ag Wound #2 Left,Circumferential Lower Leg: Aquacel Ag Wound #3 Left,Plantar Foot: Aquacel Ag Secondary Dressing: Wound #1 Right,Circumferential Lower Leg: ABD pad Dry Gauze Wound #2 Left,Circumferential Lower Leg: ABD pad Dry Gauze Wound #3 Left,Plantar Foot: ABD pad Dry Gauze Dressing Change Frequency: Wound #1 Right,Circumferential Lower Leg: Three times weekly Wound #2 Left,Circumferential Lower Leg: Three times weekly Wound #3 Left,Plantar Foot: Three times weekly Follow-up Appointments: Wound #1 Right,Circumferential Lower Leg: Return Appointment in 1 week. Wound #2 Left,Circumferential Lower Leg: CHASKE, ORNDORFF ([id number]) Return Appointment in 1 week. Wound #3 Left,Plantar Foot: Return Appointment in 1 week. Edema Control: Wound #1 Right,Circumferential Lower Leg: Unna Boots Bilaterally Other: - bring wraps that was shipped to house Wound #2 Left,Circumferential Lower Leg: Unna Boots Bilaterally Other: - bring wraps that was shipped to house Wound #3 Left,Plantar Foot: Unna Boots Bilaterally Other: - bring wraps that  was shipped to house Home Health: Wound #1 Right,Circumferential Lower Leg: Continue Home Health Visits - [place] please order Juzo compression wraps for patient. Measurements as follows: L ankle - 27cm, calf - 38cm; R ankle - 28cm, 37cm; heel to knee length 47cm Continue Home Health Visits - [facility] please order Juzo compression wraps for patient. Measurements as follows: L ankle - 27cm, calf - 38cm; R ankle - 28cm, 37cm; heel to knee length 47cm Home Health Nurse may visit PRN to address patient s wound care needs. Home Health Nurse may visit PRN to address patient s wound care needs. FACE TO FACE ENCOUNTER: MEDICARE and MEDICAID PATIENTS: I certify that this patient is under my care and that I had a face-to-face encounter that meets the physician face-to-face encounter requirements with this patient on this date. The encounter with the patient was in whole or in part for the following MEDICAL CONDITION: (primary reason for [place]) MEDICAL NECESSITY: I certify, that based on my findings, NURSING services are a medically necessary home health service. HOME BOUND STATUS: I certify  that my clinical findings support that this patient is homebound (i.e., Due to illness or injury, pt requires aid of supportive devices such as crutches, cane, wheelchairs, walkers, the use of special transportation or the assistance of another person to leave their place of residence. There is a normal inability to leave the home and doing so requires considerable and taxing effort. Other absences are for medical reasons / religious services and are infrequent or of short duration when for other reasons). FACE TO FACE ENCOUNTER: MEDICARE and MEDICAID PATIENTS: I certify that this patient is under my care and that I had a face-to-face encounter that meets the physician face-to-face encounter requirements with this patient on this date. The encounter with the patient was in whole or in part for  the following MEDICAL CONDITION: (primary reason for [place]) MEDICAL NECESSITY: I certify, that based on my findings, NURSING services are a medically necessary home health service. HOME BOUND STATUS: I certify that my clinical findings support that this patient is homebound (i.e., Due to illness or injury, pt requires aid of supportive devices such as crutches, cane, wheelchairs, walkers, the use of special transportation or the assistance of another person to leave their place of residence. There is a normal inability to leave the home and doing so requires considerable and taxing effort. Other absences are for medical reasons / religious services and are infrequent or of short duration when for other reasons). If current dressing causes regression in wound condition, may D/C ordered dressing product/s and apply Normal Saline Moist Dressing daily until next [place] / Other MD appointment. [place] of regression in wound condition at [phone]. If current dressing causes regression in wound condition, may D/C ordered dressing product/s and apply Normal Saline Moist Dressing daily until next [place] / Other MD appointment. [place] of regression in wound condition at [phone]. Please direct any NON-WOUND related issues/requests for orders to patient's Primary Care Physician Please direct any NON-WOUND related issues/requests for orders to patient's Primary Care Physician AMBROCIO, SANSEVERINO ([id number]) I have recommended silver alginate and Unna's boots to be applied and changed twice weekly. I have also recommended elevation and exercise and will order him juzo compression stockings with the Velcro wrap. he will come back and see as an regular basis Electronic Signature(s) Signed: 06/11/2015 4:30:37 PM By: Christin Fudge MD, FACS Previous Signature: 06/11/2015 2:16:22 PM Version By: Christin Fudge MD, FACS Entered By:  Christin Fudge on 06/11/2015 16:30:37 [name] ([id number]) -------------------------------------------------------------------------------- SuperBill Details Patient Name: [name] Date of Service: 06/11/2015 Medical Record Number: [id number] Patient Account Number: [id number] Date of Birth/Sex: 1927-01-30 (82 y.o. 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 I70.248 Atherosclerosis of native arteries of left leg with ulceration of other part of lower left leg Chronic venous hypertension (idiopathic) with ulcer and inflammation of right lower I87.331 extremity Chronic venous hypertension (idiopathic) with ulcer and inflammation of left lower I87.332 extremity Facility Procedures CPT4 Code: [id number] Description: (Facility Use Only) [id number] - APPLY UNNA BOOT LT Modifier: Quantity: 1 CPT4 Code: [id number] Description: (Facility Use Only) [id number] - APPLY Louretta [place] BOOT RT Modifier: Quantity: 1 Physician Procedures CPT4: Description Modifier Quantity  Code [id number] - WC PHYS LEVEL 3 - EST PT 1 ICD-10 Description Diagnosis E11.622 Type 2 diabetes mellitus with other skin ulcer I89.0 Lymphedema, not elsewhere classified I87.331 Chronic venous hypertension  (idiopathic) with ulcer and inflammation of right lower extremity I87.332 Chronic venous hypertension (idiopathic) with ulcer and inflammation of left lower extremity Electronic Signature(s) Signed: 06/11/2015 4:26:55 PM By: Christin Fudge MD, FACS Signed: 06/11/2015 4:28:54 PM By: Ardean Larsen Previous Signature: 06/11/2015 2:16:41 PM  Version By: Christin Fudge MD, FACS [name] ([id number]) Entered By: Ardean Larsen on 06/11/2015 14:33:16

## 2015-06-12 NOTE — Progress Notes (Signed)
[name] [name] ([id number]) Visit Report for 06/11/2015 Arrival Information Details Patient Name: [name] [name] Date of Service: 06/11/2015 1:30 PM Medical Record Number: [id number] Patient Account Number: [id number] Date of Birth/Sex: 25-Jul-1926 (82 y.o. Male) Treating RN: [name] [name] Primary Care Physician: [name] [name] Other Clinician: Referring Physician: Alvester [name] Treating Physician/Extender: [name] [name] in Treatment: 9 Visit Information History Since Last Visit All ordered tests and consults were completed: No Patient Arrived: [name] [name] Added or deleted any medications: No Arrival Time: 13:40 Any new allergies or adverse reactions: No Accompanied By: caregiver Had a fall or experienced change in No Transfer Assistance: None activities of daily living that may affect Patient Identification Verified: Yes risk of falls: Secondary Verification Process Yes Signs or symptoms of abuse/neglect since last No Completed: visito Patient Has Alerts: Yes Hospitalized since last visit: No Has Dressing in Place as Prescribed: Yes Has Compression in Place as Prescribed: Yes Pain Present Now: Yes Electronic Signature(s) Signed: 06/11/2015 4:28:54 PM By: [name] Eaton, RN, [name] [name] Entered By: [name] Eaton RN, [name] [name] on 06/11/2015 13:40:34 [name] [name] ([id number]) -------------------------------------------------------------------------------- Encounter Discharge Information Details Patient Name: [name] [name] Date of Service: 06/11/2015 1:30 PM Medical Record Number: [id number] Patient Account Number: [id number] Date of Birth/Sex: 07/16/1926 (82 y.o. Male) Treating RN: [name] [name] Primary Care Physician: [name] [name] Other Clinician: Referring Physician: Alvester [name] Treating Physician/Extender: [name] [name] in Treatment: 9 Encounter Discharge Information Items Facility Notification Discharge Pain Level: 0 Facility Type: Home Health Discharge Condition: Stable Orders Sent:  Yes Ambulatory Status: Walker Discharge Destination: Home Transportation: Private Auto Accompanied By: caregiver Schedule Follow-up Appointment: Yes Medication Reconciliation completed and provided to Patient/Care Yes [name] [name]: Provided on Clinical Summary of Care: 06/11/2015 Form Type Recipient Paper Patient [name] Electronic Signature(s) Signed: 06/11/2015 2:32:06 PM By: [name] [name] Entered By: [name] [name] on 06/11/2015 14:32:06 [name] [name] ([id number]) -------------------------------------------------------------------------------- Lower Extremity Assessment Details Patient Name: [name] [name] Date of Service: 06/11/2015 1:30 PM Medical Record Number: [id number] Patient Account Number: [id number] Date of Birth/Sex: 10-23-1926 (82 y.o. Male) Treating RN: [name] [name] Primary Care Physician: [name] [name] Other Clinician: Referring Physician: Alvester [name] Treating Physician/Extender: [name] [name] in Treatment: 9 Edema Assessment Assessed: [Left: No] [Right: No] E[Left: dema] [Right: :] Calf Left: Right: Point of Measurement: 35 cm From Medial Instep 41.5 cm 40 cm Ankle Left: Right: Point of Measurement: 11 cm From Medial Instep 27 cm 28 cm Vascular Assessment Pulses: Posterior Tibial Dorsalis Pedis Palpable: [Left:Yes] [Right:Yes] Extremity colors, hair growth, and conditions: Extremity Color: [Left:Mottled] [Right:Mottled] Hair Growth on Extremity: [Left:No] [Right:No] Temperature of Extremity: [Left:Warm] [Right:Warm] Capillary Refill: [Left:< 3 seconds] [Right:< 3 seconds] Toe Nail Assessment Left: Right: Thick: Yes Yes Discolored: No No Deformed: No No Improper Length and Hygiene: Yes Yes Electronic Signature(s) Signed: 06/11/2015 4:28:54 PM By: [name] Eaton, RN, [name] [name] Entered By: [name] Eaton RN, [name] [name] on 06/11/2015 13:58:09 [name] [name] ([id number]) -------------------------------------------------------------------------------- Pain Assessment  Details Patient Name: [name] [name] Date of Service: 06/11/2015 1:30 PM Medical Record Number: [id number] Patient Account Number: [id number] Date of Birth/Sex: [birth year] (82 y.o. Male) Treating RN: [name] [name] Primary Care Physician: [name] [name] Other Clinician: Referring Physician: Alvester [name] Treating Physician/Extender: [name] [name] in Treatment: 9 Active Problems Location of Pain Severity and Description of Pain Patient Has Paino Yes Site Locations Pain Location: Pain in Ulcers Duration of the Pain. Constant / Intermittento Intermittent Rate the pain. Current Pain Level: 10 Character of Pain Describe the Pain: Other: "rough" Pain Management and Medication Current Pain Management: Medication: Yes Electronic Signature(s) Signed: 06/11/2015 4:28:54 PM By: [name] Eaton,  RN, Roslynn Amble Entered By: [name] Eaton, RN, [name] [name] on 06/11/2015 13:41:15 [name] [name] ([id number]) -------------------------------------------------------------------------------- Patient/Caregiver Education Details Patient Name: [name] [name] Date of Service: 06/11/2015 1:30 PM Medical Record Number: [id number] Patient Account Number: [id number] Date of Birth/Gender: 02-02-1927 (82 y.o. Male) Treating RN: [name] [name] Primary Care Physician: [name] [name] Other Clinician: Referring Physician: Alvester [name] Treating Physician/Extender: [name] [name] in Treatment: 9 Education Assessment Education Provided To: Patient and Caregiver Education Topics Provided Wound/Skin Impairment: Handouts: Caring for Your Ulcer, Skin Care Do's and Dont's Methods: Explain/Verbal Responses: State content correctly Electronic Signature(s) Signed: 06/11/2015 4:28:54 PM By: [name] Eaton, RN, [name] [name] Entered By: [name] Eaton RN, [name] [name] on 06/11/2015 14:31:37 [name] [name] ([id number]) -------------------------------------------------------------------------------- Wound Assessment Details Patient Name: [name] [name] Date of Service:  06/11/2015 1:30 PM Medical Record Number: [id number] Patient Account Number: [id number] Date of Birth/Sex: [birth year] (82 y.o. Male) Treating RN: [name] [name] Primary Care Physician: [name] [name] Other Clinician: Referring Physician: Alvester [name] Treating Physician/Extender: [name] [name] in Treatment: 9 Wound Status Wound Number: 1 Primary Lymphedema Etiology: Wound Location: Right Lower Leg - Circumfernential Wound Open Status: Wounding Event: Gradually Appeared Comorbid Congestive Heart Failure, Date Acquired: 12/28/2014 History: Hypertension, Type II Diabetes, Weeks Of Treatment: 9 Neuropathy Clustered Wound: Yes Photos Photo Uploaded By: Alric Quan on 06/11/2015 16:25:46 Wound Measurements Length: (cm) 27 Width: (cm) 14 Depth: (cm) 0.1 Area: (cm) 296.881 Volume: (cm) 29.688 % Reduction in Area: 23.5% % Reduction in Volume: 23.5% Epithelialization: Large (67-100%) Tunneling: No Undermining: No Wound Description Classification: Partial Thickness Diabetic Severity (Wagner): Grade 0 Wound Margin: Indistinct, nonvisible Exudate Amount: Large Exudate Type: Serosanguineous Exudate Color: red, brown Foul Odor After Cleansing: No Wound Bed Granulation Amount: Large (67-100%) Exposed Structure Granulation Quality: Pink, Pale Fascia Exposed: No Boulanger, Zack ([id number]) Necrotic Amount: Small (1-33%) Fat Layer Exposed: No Necrotic Quality: Adherent Slough Tendon Exposed: No Muscle Exposed: No Joint Exposed: No Bone Exposed: No Limited to Skin Breakdown Periwound Skin Texture Texture Color No Abnormalities Noted: No No Abnormalities Noted: No Callus: No Atrophie Blanche: No Crepitus: No Cyanosis: No Excoriation: No Ecchymosis: No Fluctuance: No Erythema: No Friable: No Hemosiderin Staining: No Induration: No Mottled: No Localized Edema: No Pallor: No Rash: No Rubor: No Scarring: No Temperature / Pain Moisture Temperature: No  Abnormality No Abnormalities Noted: No Tenderness on Palpation: Yes Dry / Scaly: No Maceration: Yes Moist: Yes Wound Preparation Ulcer Cleansing: Other: soap AND WATER, Topical Anesthetic Applied: None, Other: lidocaine 4%, Treatment Notes Wound #1 (Right, Circumferential Lower Leg) 1. Cleansed with: Cleanse wound with antibacterial soap and water 2. Anesthetic Topical Lidocaine 4% cream to wound bed prior to debridement 4. Dressing Applied: Aquacel Ag 5. Secondary Dressing Applied ABD Pad Dry Gauze 7. Secured with [employer] Bilaterally Notes patient instructed to bring compression wraps at next visit Electronic Signature(s) JAHEIR, PAISLEY ([id number]) Signed: 06/11/2015 4:28:54 PM By: [name] Eaton RN, [name] [name] Entered By: [name] Eaton RN, [name] [name] on 06/11/2015 16:16:30 [name] [name] ([id number]) -------------------------------------------------------------------------------- Wound Assessment Details Patient Name: [name] [name] Date of Service: 06/11/2015 1:30 PM Medical Record Number: [id number] Patient Account Number: [id number] Date of Birth/Sex: 1926-08-29 (82 y.o. Male) Treating RN: [name] [name] Primary Care Physician: [name] [name] Other Clinician: Referring Physician: Alvester [name] Treating Physician/Extender: [name] [name] in Treatment: 9 Wound Status Wound Number: 2 Primary Lymphedema Etiology: Wound Location: Left Lower Leg - Circumfernential Wound Open Status: Wounding Event: Gradually Appeared Comorbid Congestive Heart Failure, Date Acquired: 12/28/2014 History: Hypertension, Type II Diabetes, Weeks Of Treatment: 9 Neuropathy Clustered Wound: Yes Photos Photo Uploaded By: Alric Quan  on 06/11/2015 16:26:15 Wound Measurements Length: (cm) 31 Width: (cm) 18 Depth: (cm) 0.1 Area: (cm) 438.252 Volume: (cm) 43.825 % Reduction in Area: -66.1% % Reduction in Volume: -66.1% Epithelialization: Medium (34-66%) Tunneling: No Undermining: No Wound  Description Classification: Partial Thickness Diabetic Severity (Wagner): Grade 1 Wound Margin: Indistinct, nonvisibl Exudate Amount: Large Exudate Type: Serosanguineous Exudate Color: red, brown Foul Odor After Cleansing: Yes Due to Product Use: No e Wound Bed Granulation Amount: Large (67-100%) Exposed Structure Granulation Quality: Red, Pink, Hyper-granulation Fascia Exposed: No [place]wood, Ryker ([id number]) Necrotic Amount: Small (1-33%) Fat Layer Exposed: No Necrotic Quality: Adherent Slough Tendon Exposed: No Muscle Exposed: No Joint Exposed: No Bone Exposed: No Limited to Skin Breakdown Periwound Skin Texture Texture Color No Abnormalities Noted: No No Abnormalities Noted: No Callus: No Atrophie Blanche: No Crepitus: No Cyanosis: No Excoriation: No Ecchymosis: No Fluctuance: No Erythema: No Friable: No Hemosiderin Staining: No Induration: No Mottled: No Localized Edema: Yes Pallor: No Rash: No Rubor: No Scarring: No Temperature / Pain Moisture Temperature: No Abnormality No Abnormalities Noted: No Tenderness on Palpation: Yes Dry / Scaly: No Maceration: Yes Moist: Yes Wound Preparation Ulcer Cleansing: Other: soap and water, Topical Anesthetic Applied: None, Other: lidocaine 4%, Treatment Notes Wound #2 (Left, Circumferential Lower Leg) 1. Cleansed with: Cleanse wound with antibacterial soap and water 2. Anesthetic Topical Lidocaine 4% cream to wound bed prior to debridement 4. Dressing Applied: Aquacel Ag 5. Secondary Dressing Applied ABD Pad Dry Gauze 7. Secured with [employer] Bilaterally Notes patient instructed to bring compression wraps at next visit Electronic Signature(s) VALERIY, TALBURT ([id number]) Signed: 06/11/2015 4:28:54 PM By: [name] Eaton, RN, [name] [name] Entered By: [name] Eaton, RN, [name] [name] on 06/11/2015 16:16:43 [name] [name] ([id number]) -------------------------------------------------------------------------------- Wound Assessment  Details Patient Name: [name] [name] Date of Service: 06/11/2015 1:30 PM Medical Record Number: [id number] Patient Account Number: [id number] Date of Birth/Sex: 31-Mar-1927 (82 y.o. Male) Treating RN: [name] [name] Primary Care Physician: [name] [name] Other Clinician: Referring Physician: Alvester [name] Treating Physician/Extender: [name] [name] in Treatment: 9 Wound Status Wound Number: 3 Primary Lymphedema Etiology: Wound Location: Left Foot - Plantar Wound Open Wounding Event: Gradually Appeared Status: Date Acquired: 01/27/2015 Comorbid Congestive Heart Failure, Weeks Of Treatment: 9 History: Hypertension, Type II Diabetes, Clustered Wound: No Neuropathy Photos Photo Uploaded By: Alric Quan on 06/11/2015 16:26:38 Wound Measurements Length: (cm) 0.5 Width: (cm) 3 Depth: (cm) 0.1 Area: (cm) 1.178 Volume: (cm) 0.118 % Reduction in Area: 84.4% % Reduction in Volume: 84.4% Epithelialization: Small (1-33%) Tunneling: No Undermining: No Wound Description Classification: Partial Thickness Diabetic Severity Earleen Newport): Grade 0 Wound Margin: Indistinct, nonvisible Exudate Amount: Medium Exudate Type: Serous Exudate Color: amber Foul Odor After Cleansing: No Wound Bed Granulation Amount: Large (67-100%) Exposed Structure Granulation Quality: Pink Fascia Exposed: No Necrotic Amount: Small (1-33%) Fat Layer Exposed: No Stahnke, Clarkson ([id number]) Necrotic Quality: Adherent Slough Tendon Exposed: No Muscle Exposed: No Joint Exposed: No Bone Exposed: No Limited to Skin Breakdown Periwound Skin Texture Texture Color No Abnormalities Noted: No No Abnormalities Noted: No Callus: No Atrophie Blanche: No Crepitus: No Cyanosis: No Excoriation: No Ecchymosis: No Fluctuance: No Erythema: No Friable: No Hemosiderin Staining: No Induration: No Mottled: No Localized Edema: No Pallor: No Rash: No Rubor: No Scarring: No Temperature / Pain Moisture Temperature:  No Abnormality No Abnormalities Noted: No Dry / Scaly: No Maceration: Yes Moist: Yes Wound Preparation Ulcer Cleansing: Other: water and soap, Topical Anesthetic Applied: Other: lidocaine 4%, Treatment Notes Wound #3 (Left, Plantar Foot) 1. Cleansed with: Cleanse wound with antibacterial soap  and water 2. Anesthetic Topical Lidocaine 4% cream to wound bed prior to debridement 4. Dressing Applied: Aquacel Ag 5. Secondary Dressing Applied ABD Pad Dry Gauze 7. Secured with [employer] Bilaterally Notes patient instructed to bring compression wraps at next visit Electronic Signature(s) Signed: 06/11/2015 4:28:54 PM By: [name] Eaton RN, Charlotta Newton, Roby ([id number]) Entered By: [name] Eaton RN, [name] [name] on 06/11/2015 16:16:52 [name] [name] ([id number]) -------------------------------------------------------------------------------- [place] Details Patient Name: [name] [name] Date of Service: 06/11/2015 1:30 PM Medical Record Number: [id number] Patient Account Number: [id number] Date of Birth/Sex: 08-05-1926 (82 y.o. Male) Treating RN: [name] [name] Primary Care Physician: [name] [name] Other Clinician: Referring Physician: Alvester [name] Treating Physician/Extender: [name] [name] in Treatment: 9 Vital Signs Time Taken: 13:45 Temperature (F): 97.8 Height (in): 70 Pulse (bpm): 73 Weight (lbs): 230 Blood Pressure (mmHg): 118/95 Body Mass Index (BMI): 33 Reference Range: 80 - 120 mg / dl Electronic Signature(s) Signed: 06/11/2015 4:28:54 PM By: [name] Eaton RN, [name] [name] Entered By: [name] Eaton RN, [name] [name] on 06/11/2015 13:43:42

## 2015-06-14 ENCOUNTER — Ambulatory Visit: Payer: Medicare Other | Admitting: Surgery

## 2015-06-18 ENCOUNTER — Encounter: Payer: Medicare Other | Admitting: Surgery

## 2015-06-18 DIAGNOSIS — E11622 Type 2 diabetes mellitus with other skin ulcer: Secondary | ICD-10-CM | POA: Diagnosis not present

## 2015-06-19 NOTE — Progress Notes (Signed)
MARCOS, EDWARDSON ([id number]) Visit Report for 06/18/2015 Chief Complaint Document Details Patient Name: [name] [name], [name] [name] Date of Service: 06/18/2015 1:30 PM Medical Record Number: [id number] Patient Account Number: [id number] Date of Birth/Sex: 1926/12/22 (82 y.o. Male) Treating RN: Macarthur Critchley Primary Care Physician: Alvester Chou Other Clinician: Referring Physician: Alvester Chou Treating Physician/Extender: Frann Rider in Treatment: 10 Information Obtained from: Patient Chief Complaint Patient presents to the wound care center for a consult due non healing wound both lower extremities. The patient is a poor historian and was seen here once in July 2016. He says home health was doing his dressings and he was healed but now has recurrence of the problem. Problem has been there at least for 6 months. Electronic Signature(s) Signed: 06/18/2015 2:44:59 PM By: Christin Fudge MD, FACS Entered By: Christin Fudge on 06/18/2015 14:44:58 [name] [name] ([id number]) -------------------------------------------------------------------------------- HPI Details Patient Name: [name] [name] Date of Service: 06/18/2015 1:30 PM Medical Record Number: [id number] Patient Account Number: [id number] Date of Birth/Sex: 03/18/1927 (82 y.o. Male) Treating RN: Macarthur Critchley Primary Care Physician: Alvester Chou Other Clinician: Referring Physician: Alvester Chou Treating Physician/Extender: Frann Rider in Treatment: 10 History of Present Illness Location: swelling of the legs right more than left an ulceration both lower extremities Quality: Patient reports experiencing a dull pain to affected area(s). Severity: Patient states wound (s) are getting worse Duration: Patient has had the wound for > 6 months prior to seeking treatment at the wound center Timing: Pain in wound is Intermittent. Context: The wound appeared gradually over time Modifying Factors: he has been having home health come and apply his  dressing daily was healed. Associated Signs and Symptoms: Patient reports having difficulty standing for long periods. HPI Description: Kriz Canedy is a 82 y.o. male with history of DM, hypertension, chronic kidney disease, gout, diastolic dysfunction and the patient was having increasing swelling with increasing fluid draining from the legs and pain over the last 6 weeks. He says his wounds are completely healed and the home health had stopped coming to do his dressings. Recent hemoglobin A1c was 6.6. During the last admission A venous duplex study revealed no evidence of deep vein thrombosis or superficial thrombosis involving the right and left lower extremity. Was seen in January 2016 by Dr. Adele Barthel at [facility]: Non-Invasive Vascular Imaging ABI (05/05/2014) o R: 0.65 (0.73), DP: mono, PT: mono, TBI: 0.57 o L: 0.65 (0.69), DP: mono, PT: mono, TBI: 0.36 The patient had the diagnosis of bilateral lower extremity moderate peripheral atherosclerotic disease, bilateral lower extremity chronic venous insufficiency. Due to the patient's age and mental status conservative therapy was only discussed and no surgical intervention was planned. The patient has significant dementia and has a caregiver who spends a few hours every day coming to see him. He does not have any family members. 05/21/2015 -- lower extremity arterial studies done on 05/17/2015 show the right ABI is 0.50-0.79 indicating moderate arterial occlusive disease at rest. The left ABI was 0.5-0.79 indicating moderate arterial occlusive disease at rest. The left posterior tibial and peroneal arteries were not evaluated due to local wound condition. The right TBI was less than 0.69 and the left ABI was less than 0.69 which are both abnormal. Since it's been a year since the previous opinion from Dr. Bridgett Larsson I have asked him to see him for a review. 05/28/2015 -- was seen by Dr. Bridgett Larsson and he recommended that he was not a good candidate for  surgery and surgical revascularization if the need develops. He  recommends to continue with compressive therapy and wound care. 06/04/2015 -- had asked him to remove his compression wraps have a shower and come to see as but he has probably been scratching his legs and caused a lot of excoriation and has multiple open ulcers which Mallet, Pankaj ([id number]) are much more worse than before. The patient is not very compliant and may be this is because of his dementia. Electronic Signature(s) Signed: 06/18/2015 2:45:10 PM By: Christin Fudge MD, FACS Entered By: Christin Fudge on 06/18/2015 14:45:10 [name] [name] ([id number]) -------------------------------------------------------------------------------- Physical Exam Details Patient Name: [name] [name] Date of Service: 06/18/2015 1:30 PM Medical Record Number: [id number] Patient Account Number: [id number] Date of Birth/Sex: [birth year] (82 y.o. Male) Treating RN: Macarthur Critchley Primary Care Physician: Alvester Chou Other Clinician: Referring Physician: Alvester Chou Treating Physician/Extender: Frann Rider in Treatment: 10 Constitutional . Pulse regular. Respirations normal and unlabored. Afebrile. . Eyes Nonicteric. Reactive to light. Ears, Nose, Mouth, and Throat Lips, teeth, and gums WNL.Marland Kitchen Moist mucosa without lesions. Neck supple and nontender. No palpable supraclavicular or cervical adenopathy. Normal sized without goiter. Respiratory WNL. No retractions.. Cardiovascular Pedal Pulses WNL. No clubbing, cyanosis or edema. Lymphatic No adneopathy. No adenopathy. No adenopathy. Musculoskeletal Adexa without tenderness or enlargement.. Digits and nails w/o clubbing, cyanosis, infection, petechiae, ischemia, or inflammatory conditions.. Integumentary (Hair, Skin) No suspicious lesions. No crepitus or fluctuance. No peri-wound warmth or erythema. No masses.Marland Kitchen Psychiatric Judgement and insight Intact.. No evidence of depression,  anxiety, or agitation.. Notes the excoriation on the ulceration about the same as last week and his edema is a bit better. He continues to have very poor hygiene. Electronic Signature(s) Signed: 06/18/2015 2:45:48 PM By: Christin Fudge MD, FACS Entered By: Christin Fudge on 06/18/2015 14:45:48 [name] [name] ([id number]) -------------------------------------------------------------------------------- Physician Orders Details Patient Name: [name] [name] Date of Service: 06/18/2015 1:30 PM Medical Record Number: [id number] Patient Account Number: [id number] Date of Birth/Sex: 1926/06/25 (82 y.o. Male) Treating RN: Macarthur Critchley Primary Care Physician: Alvester Chou Other Clinician: Referring Physician: Alvester Chou Treating Physician/Extender: Frann Rider in Treatment: 10 Verbal / Phone Orders: Yes Clinician: Macarthur Critchley Read Back and Verified: Yes Diagnosis Coding Wound Cleansing Wound #1 Right,Circumferential Lower Leg o May shower with protection. Wound #2 Left,Circumferential Lower Leg o May shower with protection. Wound #3 Left,Plantar Foot o May shower with protection. Primary Wound Dressing Wound #1 Right,Circumferential Lower Leg o Aquacel Ag Wound #2 Left,Circumferential Lower Leg o Aquacel Ag Wound #3 Left,Plantar Foot o Aquacel Ag Secondary Dressing Wound #1 Right,Circumferential Lower Leg o ABD pad o Dry Gauze Wound #2 Left,Circumferential Lower Leg o ABD pad o Dry Gauze Wound #3 Left,Plantar Foot o ABD pad o Dry Gauze Dressing Change Frequency Wound #1 Right,Circumferential Lower Leg o Three times weekly Lieser, Aiden ([id number]) Wound #2 Left,Circumferential Lower Leg o Three times weekly Wound #3 Left,Plantar Foot o Three times weekly Follow-up Appointments Wound #1 Right,Circumferential Lower Leg o Return Appointment in 2 weeks. - HHRN to come 3x/week next week Wound #2 Left,Circumferential Lower Leg o Return  Appointment in 2 weeks. - HHRN to come 3x/week next week Wound #3 Left,Plantar Foot o Return Appointment in 2 weeks. - HHRN to come 3x/week next week Edema Control Wound #1 Right,Circumferential Lower Leg o Unna Boots Bilaterally o Other: - bring wraps that was shipped to house Wound #2 Left,Circumferential Lower Leg o Unna Boots Bilaterally o Other: - bring wraps that was shipped to house Wound #3 Left,Plantar Foot o Unna Boots Bilaterally o Other: - bring  wraps that was shipped to [place] #[address] [place] please order Juzo compression wraps for patient. Measurements as follows: L ankle - 27cm, calf - 38cm; R ankle - 28cm, 37cm; heel to knee length 47cm o [place] please order Juzo compression wraps for patient. Measurements as follows: L ankle - 27cm, calf - 38cm; R ankle - 28cm, 37cm; heel to knee length 47cm o Home Health Nurse may visit PRN to address patientos wound care needs. o Home Health Nurse may visit PRN to address patientos wound care needs. o FACE TO FACE ENCOUNTER: MEDICARE and MEDICAID PATIENTS: I certify that this patient is under my care and that I had a face-to-face encounter that meets the physician face-to-face encounter requirements with this patient on this date. The encounter with the patient was in whole or in part for the following MEDICAL CONDITION: (primary reason for [place]) MEDICAL NECESSITY: I certify, that based on my findings, NURSING services are a medically necessary home health service. HOME BOUND STATUS: I certify that my clinical findings support that this patient is homebound (i.e., Due to illness or injury, pt requires aid of supportive devices such as crutches, cane, wheelchairs, walkers, the use of special [place], [name] [name] ([id number]) transportation or the assistance of another person to leave  their place of residence. There is a normal inability to leave the home and doing so requires considerable and taxing effort. Other absences are for medical reasons / religious services and are infrequent or of short duration when for other reasons). o FACE TO FACE ENCOUNTER: MEDICARE and MEDICAID PATIENTS: I certify that this patient is under my care and that I had a face-to-face encounter that meets the physician face-to-face encounter requirements with this patient on this date. The encounter with the patient was in whole or in part for the following MEDICAL CONDITION: (primary reason for [place]) MEDICAL NECESSITY: I certify, that based on my findings, NURSING services are a medically necessary home health service. HOME BOUND STATUS: I certify that my clinical findings support that this patient is homebound (i.e., Due to illness or injury, pt requires aid of supportive devices such as crutches, cane, wheelchairs, walkers, the use of special transportation or the assistance of another person to leave their place of residence. There is a normal inability to leave the home and doing so requires considerable and taxing effort. Other absences are for medical reasons / religious services and are infrequent or of short duration when for other reasons). o If current dressing causes regression in wound condition, may D/C ordered dressing product/s and apply Normal Saline Moist Dressing daily until next [place] / Other MD appointment. [place] of regression in wound condition at [phone]. o If current dressing causes regression in wound condition, may D/C ordered dressing product/s and apply Normal Saline Moist Dressing daily until next [place] / Other MD appointment. [place] of regression in wound condition at [phone]. o Please direct any NON-WOUND related issues/requests for orders to patient's Primary  Care Physician o Please direct any NON-WOUND related issues/requests for orders to patient's Primary Care Physician Wound #2 Left,Circumferential Lower Leg o [place] please order Juzo compression wraps for patient. Measurements as follows: L ankle - 27cm, calf - 38cm; R ankle - 28cm, 37cm; heel to knee length 47cm o [place] [facility] -  [facility] - HHRN please order Juzo compression wraps for patient. Measurements as follows: L ankle - 27cm, calf - 38cm; R ankle - 28cm, 37cm; heel to knee length 47cm o Home Health Nurse may visit PRN to address patientos wound care needs. o Home Health Nurse may visit PRN to address patientos wound care needs. o FACE TO FACE ENCOUNTER: MEDICARE and MEDICAID PATIENTS: I certify that this patient is under my care and that I had a face-to-face encounter that meets the physician face-to-face encounter requirements with this patient on this date. The encounter with the patient was in whole or in part for the following MEDICAL CONDITION: (primary reason for [place]) MEDICAL NECESSITY: I certify, that based on my findings, NURSING services are a medically necessary home health service. HOME BOUND STATUS: I certify that my clinical findings support that this patient is homebound (i.e., Due to illness or injury, pt requires aid of supportive devices such as crutches, cane, wheelchairs, walkers, the use of special transportation or the assistance of another person to leave their place of residence. There is a normal inability to leave the home and doing so requires considerable and taxing effort. Other absences are for medical reasons / religious services and are infrequent or of short duration when for other reasons). o FACE TO FACE ENCOUNTER: MEDICARE and MEDICAID PATIENTS: I certify that this patient is under my care and that I had a face-to-face encounter that meets the physician  face-to-face [name] [name], [name] [name] ([id number]) encounter requirements with this patient on this date. The encounter with the patient was in whole or in part for the following MEDICAL CONDITION: (primary reason for [place]) MEDICAL NECESSITY: I certify, that based on my findings, NURSING services are a medically necessary home health service. HOME BOUND STATUS: I certify that my clinical findings support that this patient is homebound (i.e., Due to illness or injury, pt requires aid of supportive devices such as crutches, cane, wheelchairs, walkers, the use of special transportation or the assistance of another person to leave their place of residence. There is a normal inability to leave the home and doing so requires considerable and taxing effort. Other absences are for medical reasons / religious services and are infrequent or of short duration when for other reasons). o If current dressing causes regression in wound condition, may D/C ordered dressing product/s and apply Normal Saline Moist Dressing daily until next [place] / Other MD appointment. [place] of regression in wound condition at [phone]. o If current dressing causes regression in wound condition, may D/C ordered dressing product/s and apply Normal Saline Moist Dressing daily until next [place] / Other MD appointment. [place] of regression in wound condition at [phone]. o Please direct any NON-WOUND related issues/requests for orders to patient's Primary Care Physician o Please direct any NON-WOUND related issues/requests for orders to patient's Primary Care Physician Wound #3 [place] please order Juzo compression wraps for patient. Measurements as follows: L ankle - 27cm, calf - 38cm; R ankle - 28cm, 37cm; heel to knee length 47cm o [place]  please order Juzo compression wraps for patient. Measurements as follows: L ankle - 27cm, calf - 38cm; R ankle - 28cm, 37cm; heel to knee length 47cm o Home Health Nurse may visit PRN to address patientos wound care needs. o Home Health Nurse may visit PRN to address patientos wound care  needs. o FACE TO FACE ENCOUNTER: MEDICARE and MEDICAID PATIENTS: I certify that this patient is under my care and that I had a face-to-face encounter that meets the physician face-to-face encounter requirements with this patient on this date. The encounter with the patient was in whole or in part for the following MEDICAL CONDITION: (primary reason for [place]) MEDICAL NECESSITY: I certify, that based on my findings, NURSING services are a medically necessary home health service. HOME BOUND STATUS: I certify that my clinical findings support that this patient is homebound (i.e., Due to illness or injury, pt requires aid of supportive devices such as crutches, cane, wheelchairs, walkers, the use of special transportation or the assistance of another person to leave their place of residence. There is a normal inability to leave the home and doing so requires considerable and taxing effort. Other absences are for medical reasons / religious services and are infrequent or of short duration when for other reasons). o FACE TO FACE ENCOUNTER: MEDICARE and MEDICAID PATIENTS: I certify that this patient is under my care and that I had a face-to-face encounter that meets the physician face-to-face encounter requirements with this patient on this date. The encounter with the patient was in whole or in part for the following MEDICAL CONDITION: (primary reason for [place]) MEDICAL NECESSITY: I certify, that based on my findings, NURSING services are a medically necessary home health service. HOME BOUND STATUS: I certify that my clinical findings support that this patient is homebound (i.e., Due to  illness or injury, pt requires aid of supportive devices such as crutches, cane, wheelchairs, walkers, the use of special [place], [name] [name] ([id number]) transportation or the assistance of another person to leave their place of residence. There is a normal inability to leave the home and doing so requires considerable and taxing effort. Other absences are for medical reasons / religious services and are infrequent or of short duration when for other reasons). o If current dressing causes regression in wound condition, may D/C ordered dressing product/s and apply Normal Saline Moist Dressing daily until next [place] / Other MD appointment. [place] of regression in wound condition at [phone]. o If current dressing causes regression in wound condition, may D/C ordered dressing product/s and apply Normal Saline Moist Dressing daily until next [place] / Other MD appointment. [place] of regression in wound condition at [phone]. o Please direct any NON-WOUND related issues/requests for orders to patient's Primary Care Physician o Please direct any NON-WOUND related issues/requests for orders to patient's Primary Care Physician Electronic Signature(s) Signed: 06/18/2015 2:58:24 PM By: Christin Fudge MD, FACS Signed: 06/18/2015 4:54:11 PM By: Rebecca Eaton RN, [place] Entered By: Rebecca Eaton RN, [place] on 06/18/2015 14:21:24 [name] [name], [name] [name] ([id number]) -------------------------------------------------------------------------------- Problem List Details Patient Name: [name] [name] Date of Service: 06/18/2015 1:30 PM Medical Record Number: [id number] Patient Account Number: [id number] Date of Birth/Sex: [birth year] (82 y.o. Male) Treating RN: Macarthur Critchley Primary Care Physician: Alvester Chou Other Clinician: Referring Physician: Alvester Chou Treating Physician/Extender: Frann Rider in Treatment: 10 Active  Problems ICD-10 Encounter Code Description Active Date Diagnosis E11.622 Type 2 diabetes mellitus with other skin ulcer 04/09/2015 Yes I89.0 Lymphedema, not elsewhere classified 04/09/2015 Yes I70.238 Atherosclerosis of native arteries of right leg with 04/09/2015 Yes ulceration of other part of lower right leg I70.248 Atherosclerosis of native arteries of left leg with ulceration 04/09/2015 Yes of other part of lower left leg I87.331 Chronic venous hypertension (idiopathic) with ulcer and 04/09/2015  Yes inflammation of right lower extremity I87.332 Chronic venous hypertension (idiopathic) with ulcer and 04/09/2015 Yes inflammation of left lower extremity Inactive Problems Resolved Problems Electronic Signature(s) Signed: 06/18/2015 2:44:48 PM By: Christin Fudge MD, FACS Entered By: Christin Fudge on 06/18/2015 14:44:48 [name] [name] ([id number]) -------------------------------------------------------------------------------- Progress Note Details Patient Name: [name] [name] Date of Service: 06/18/2015 1:30 PM Medical Record Number: [id number] Patient Account Number: [id number] Date of Birth/Sex: 1926-08-09 (82 y.o. Male) Treating RN: Macarthur Critchley Primary Care Physician: Alvester Chou Other Clinician: Referring Physician: Alvester Chou Treating Physician/Extender: Frann Rider in Treatment: 10 Subjective Chief Complaint Information obtained from Patient Patient presents to the wound care center for a consult due non healing wound both lower extremities. The patient is a poor historian and was seen here once in July 2016. He says home health was doing his dressings and he was healed but now has recurrence of the problem. Problem has been there at least for 6 months. History of Present Illness (HPI) The following HPI elements were documented for the patient's wound: Location: swelling of the legs right more than left an ulceration both lower extremities Quality: Patient reports  experiencing a dull pain to affected area(s). Severity: Patient states wound (s) are getting worse Duration: Patient has had the wound for > 6 months prior to seeking treatment at the wound center Timing: Pain in wound is Intermittent. Context: The wound appeared gradually over time Modifying Factors: he has been having home health come and apply his dressing daily was healed. Associated Signs and Symptoms: Patient reports having difficulty standing for long periods. [name] [name] is a 82 y.o. male with history of DM, hypertension, chronic kidney disease, gout, diastolic dysfunction and the patient was having increasing swelling with increasing fluid draining from the legs and pain over the last 6 weeks. He says his wounds are completely healed and the home health had stopped coming to do his dressings. Recent hemoglobin A1c was 6.6. During the last admission A venous duplex study revealed no evidence of deep vein thrombosis or superficial thrombosis involving the right and left lower extremity. Was seen in January 2016 by Dr. Adele Barthel at [facility]: Non-Invasive Vascular Imaging ABI (05/05/2014)  R: 0.65 (0.73), DP: mono, PT: mono, TBI: 0.57  L: 0.65 (0.69), DP: mono, PT: mono, TBI: 0.36 The patient had the diagnosis of bilateral lower extremity moderate peripheral atherosclerotic disease, bilateral lower extremity chronic venous insufficiency. Due to the patient's age and mental status conservative therapy was only discussed and no surgical intervention was planned. The patient has significant dementia and has a caregiver who spends a few hours every day coming to see him. He does not have any family members. 05/21/2015 -- lower extremity arterial studies done on 05/17/2015 show the right ABI is 0.50-0.79 indicating moderate arterial occlusive disease at rest. The left ABI was 0.5-0.79 indicating moderate arterial occlusive disease at rest. The left posterior tibial and peroneal arteries were  not evaluated due to local wound [name] [name], [name] [name] ([id number]) condition. The right TBI was less than 0.69 and the left ABI was less than 0.69 which are both abnormal. Since it's been a year since the previous opinion from Dr. Bridgett Larsson I have asked him to see him for a review. 05/28/2015 -- was seen by Dr. Bridgett Larsson and he recommended that he was not a good candidate for surgery and surgical revascularization if the need develops. He recommends to continue with compressive therapy and wound care. 06/04/2015 -- had asked him to remove his compression wraps have  a shower and come to see as but he has probably been scratching his legs and caused a lot of excoriation and has multiple open ulcers which are much more worse than before. The patient is not very compliant and may be this is because of his dementia. Objective Constitutional Pulse regular. Respirations normal and unlabored. Afebrile. Vitals Time Taken: 1:49 PM, Height: 70 in, Weight: 230 lbs, BMI: 33, Temperature: 97.9 F. Eyes Nonicteric. Reactive to light. Ears, Nose, Mouth, and Throat Lips, teeth, and gums WNL.Marland Kitchen Moist mucosa without lesions. Neck supple and nontender. No palpable supraclavicular or cervical adenopathy. Normal sized without goiter. Respiratory WNL. No retractions.. Cardiovascular Pedal Pulses WNL. No clubbing, cyanosis or edema. Lymphatic No adneopathy. No adenopathy. No adenopathy. Musculoskeletal Adexa without tenderness or enlargement.. Digits and nails w/o clubbing, cyanosis, infection, petechiae, ischemia, or inflammatory conditions.Marland Kitchen Psychiatric Judgement and insight Intact.. No evidence of depression, anxiety, or agitation.[name] [name] ([id number]) General Notes: the excoriation on the ulceration about the same as last week and his edema is a bit better. He continues to have very poor hygiene. Integumentary (Hair, Skin) No suspicious lesions. No crepitus or fluctuance. No peri-wound warmth or erythema. No  masses.. Wound #1 status is Open. Original cause of wound was Gradually Appeared. The wound is located on the Right,Circumferential Lower Leg. The wound measures 13cm length x 14cm width x 0.1cm depth; 142.942cm^2 area and 14.294cm^3 volume. The wound is limited to skin breakdown. There is no tunneling or undermining noted. There is a large amount of serous drainage noted. The wound margin is indistinct and nonvisible. There is large (67-100%) pink, pale granulation within the wound bed. There is a small (1-33%) amount of necrotic tissue within the wound bed including Adherent Slough. The periwound skin appearance exhibited: Maceration, Moist. The periwound skin appearance did not exhibit: Callus, Crepitus, Excoriation, Fluctuance, Friable, Induration, Localized Edema, Rash, Scarring, Dry/Scaly, Atrophie Blanche, Cyanosis, Ecchymosis, Hemosiderin Staining, Mottled, Pallor, Rubor, Erythema. Periwound temperature was noted as No Abnormality. The periwound has tenderness on palpation. Wound #2 status is Open. Original cause of wound was Gradually Appeared. The wound is located on the Left,Circumferential Lower Leg. The wound measures 15.5cm length x 26cm width x 0.1cm depth; 316.515cm^2 area and 31.652cm^3 volume. The wound is limited to skin breakdown. There is no tunneling or undermining noted. There is a large amount of serous drainage noted. The wound margin is indistinct and nonvisible. There is large (67-100%) red, pink granulation within the wound bed. There is a small (1-33%) amount of necrotic tissue within the wound bed including Adherent Slough. The periwound skin appearance exhibited: Localized Edema, Maceration, Moist. The periwound skin appearance did not exhibit: Callus, Crepitus, Excoriation, Fluctuance, Friable, Induration, Rash, Scarring, Dry/Scaly, Atrophie Blanche, Cyanosis, Ecchymosis, Hemosiderin Staining, Mottled, Pallor, Rubor, Erythema. Periwound temperature was noted as No  Abnormality. The periwound has tenderness on palpation. Wound #3 status is Open. Original cause of wound was Gradually Appeared. The wound is located on the [place]. The wound measures 1cm length x 3.8cm width x 0.1cm depth; 2.985cm^2 area and 0.298cm^3 volume. The wound is limited to skin breakdown. There is no tunneling or undermining noted. There is a medium amount of serous drainage noted. The wound margin is indistinct and nonvisible. There is large (67-100%) pink granulation within the wound bed. There is a small (1-33%) amount of necrotic tissue within the wound bed including Adherent Slough. The periwound skin appearance exhibited: Maceration, Moist. The periwound skin appearance did not exhibit: Callus, Crepitus, Excoriation, Fluctuance, Friable,  Induration, Localized Edema, Rash, Scarring, Dry/Scaly, Atrophie Blanche, Cyanosis, Ecchymosis, Hemosiderin Staining, Mottled, Pallor, Rubor, Erythema. Periwound temperature was noted as No Abnormality. 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 Wound #2 Left,Circumferential Lower Leg: May shower with protection. Wound #3 Left,Plantar Foot: May shower with protection. Primary Wound Dressing: Wound #1 Right,Circumferential Lower Leg: Aquacel Ag Wound #2 Left,Circumferential Lower Leg: Aquacel Ag Wound #3  Left,Plantar Foot: Aquacel Ag Secondary Dressing: Wound #1 Right,Circumferential Lower Leg: ABD pad Dry Gauze Wound #2 Left,Circumferential Lower Leg: ABD pad Dry Gauze Wound #3 Left,Plantar Foot: ABD pad Dry Gauze Dressing Change Frequency: Wound #1 Right,Circumferential Lower Leg: Three times weekly Wound #2 Left,Circumferential Lower Leg: Three times weekly Wound #3 Left,Plantar Foot: Three times weekly Follow-up Appointments: Wound #1 Right,Circumferential Lower Leg: Return Appointment in 2 weeks. - HHRN to come 3x/week next week Wound #2 Left,Circumferential Lower Leg: Return Appointment in 2 weeks. - HHRN to come 3x/week next week Wound #3 Left,Plantar Foot: [name] [name], [name] [name] ([id number]) Return Appointment in 2 weeks. - HHRN to come 3x/week next week Edema Control: Wound #1 Right,Circumferential Lower Leg: Unna Boots Bilaterally Other: - bring wraps that was shipped to house Wound #2 Left,Circumferential Lower Leg: Unna Boots Bilaterally Other: - bring wraps that was shipped to house Wound #3 Left,Plantar Foot: Unna Boots Bilaterally Other: - bring wraps that was shipped to house Home Health: Wound #1 Right,Circumferential Lower Leg: Continue Home Health Visits - [place] please order Juzo compression wraps for patient. Measurements as follows: L ankle - 27cm, calf - 38cm; R ankle - 28cm, 37cm; heel to knee length 47cm Continue Home Health Visits - [facility] please order Juzo compression wraps for patient. Measurements as follows: L ankle - 27cm, calf - 38cm; R ankle - 28cm, 37cm; heel to knee length 47cm Home Health Nurse may visit PRN to address patient s wound care needs. Home Health Nurse may visit PRN to address patient s wound care needs. FACE TO FACE ENCOUNTER: MEDICARE and MEDICAID PATIENTS: I certify that this patient is under my care and that I had a face-to-face encounter that meets the physician face-to-face encounter requirements with this  patient on this date. The encounter with the patient was in whole or in part for the following MEDICAL CONDITION: (primary reason for [place]) MEDICAL NECESSITY: I certify, that based on my findings, NURSING services are a medically necessary home health service. HOME BOUND STATUS: I certify that my clinical findings support that this patient is homebound (i.e., Due to illness or injury, pt requires aid of supportive devices such as crutches, cane, wheelchairs, walkers, the use of special transportation or the assistance of another person to leave their place of residence. There is a normal inability to leave the home and doing so requires considerable and taxing effort. Other absences are for medical reasons / religious services and are infrequent or of short duration when for other reasons). FACE TO FACE ENCOUNTER: MEDICARE and MEDICAID PATIENTS: I  certify that this patient is under my care and that I had a face-to-face encounter that meets the physician face-to-face encounter requirements with this patient on this date. The encounter with the patient was in whole or in part for the following MEDICAL CONDITION: (primary reason for [place]) MEDICAL NECESSITY: I certify, that based on my findings, NURSING services are a medically necessary home health service. HOME BOUND STATUS: I certify that my clinical findings support that this patient is homebound (i.e., Due to illness or injury, pt requires aid of supportive devices such as crutches, cane, wheelchairs, walkers, the use of special transportation or the assistance of another person to leave their place of residence. There is a normal inability to leave the home and doing so requires considerable and taxing effort. Other absences are for medical reasons / religious services and are infrequent or of short duration when for other reasons). If current dressing causes regression in wound condition, may D/C ordered dressing product/s  and apply Normal Saline Moist Dressing daily until next [place] / Other MD appointment. [place] of regression in wound condition at [phone]. If current dressing causes regression in wound condition, may D/C ordered dressing product/s and apply Normal Saline Moist Dressing daily until next [place] / Other MD appointment. [place] of regression in wound condition at [phone]. Please direct any NON-WOUND related issues/requests for orders to patient's Primary Care Physician Please direct any NON-WOUND related issues/requests for orders to patient's Primary Care Physician Wound #2 Left,Circumferential Lower Leg: [place] please order Juzo compression wraps for patient. Measurements as follows: L ankle - 27cm, calf - 38cm; R ankle - 28cm, 37cm; heel to knee length 47cm Continue [facility] Health Visits - [facility] please order Juzo compression wraps for patient. [name] [name], [name] [name] ([id number]) Measurements as follows: L ankle - 27cm, calf - 38cm; R ankle - 28cm, 37cm; heel to knee length 47cm Home Health Nurse may visit PRN to address patient s wound care needs. Home Health Nurse may visit PRN to address patient s wound care needs. FACE TO FACE ENCOUNTER: MEDICARE and MEDICAID PATIENTS: I certify that this patient is under my care and that I had a face-to-face encounter that meets the physician face-to-face encounter requirements with this patient on this date. The encounter with the patient was in whole or in part for the following MEDICAL CONDITION: (primary reason for [place]) MEDICAL NECESSITY: I certify, that based on my findings, NURSING services are a medically necessary home health service. HOME BOUND STATUS: I certify that my clinical findings support that this patient is homebound (i.e., Due to illness or injury, pt requires aid of supportive devices such as crutches, cane,  wheelchairs, walkers, the use of special transportation or the assistance of another person to leave their place of residence. There is a normal inability to leave the home and doing so requires considerable and taxing effort. Other absences are for medical reasons / religious services and are infrequent or of short duration when for other reasons). FACE TO FACE ENCOUNTER: MEDICARE and MEDICAID PATIENTS: I certify that this patient is under my care and that I had a face-to-face encounter that meets the physician face-to-face encounter requirements with this patient on this date. The encounter with the patient was in whole or in part for the following MEDICAL CONDITION: (primary reason for [place]) MEDICAL NECESSITY: I certify, that based on my findings, NURSING services are a  medically necessary home health service. HOME BOUND STATUS: I certify that my clinical findings support that this patient is homebound (i.e., Due to illness or injury, pt requires aid of supportive devices such as crutches, cane, wheelchairs, walkers, the use of special transportation or the assistance of another person to leave their place of residence. There is a normal inability to leave the home and doing so requires considerable and taxing effort. Other absences are for medical reasons / religious services and are infrequent or of short duration when for other reasons). If current dressing causes regression in wound condition, may D/C ordered dressing product/s and apply Normal Saline Moist Dressing daily until next [place] / Other MD appointment. [place] of regression in wound condition at [phone]. If current dressing causes regression in wound condition, may D/C ordered dressing product/s and apply Normal Saline Moist Dressing daily until next [place] / Other MD appointment. [place] of regression in wound condition at [phone]. Please  direct any NON-WOUND related issues/requests for orders to patient's Primary Care Physician Please direct any NON-WOUND related issues/requests for orders to patient's Primary Care Physician Wound #3 Left,Plantar Foot: [place] please order Juzo compression wraps for patient. Measurements as follows: L ankle - 27cm, calf - 38cm; R ankle - 28cm, 37cm; heel to knee length 47cm Continue [facility] Health Visits - [facility] please order Juzo compression wraps for patient. Measurements as follows: L ankle - 27cm, calf - 38cm; R ankle - 28cm, 37cm; heel to knee length 47cm Home Health Nurse may visit PRN to address patient s wound care needs. Home Health Nurse may visit PRN to address patient s wound care needs. FACE TO FACE ENCOUNTER: MEDICARE and MEDICAID PATIENTS: I certify that this patient is under my care and that I had a face-to-face encounter that meets the physician face-to-face encounter requirements with this patient on this date. The encounter with the patient was in whole or in part for the following MEDICAL CONDITION: (primary reason for [place]) MEDICAL NECESSITY: I certify, that based on my findings, NURSING services are a medically necessary home health service. HOME BOUND STATUS: I certify that my clinical findings support that this patient is homebound (i.e., Due to illness or injury, pt requires aid of supportive devices such as crutches, cane, wheelchairs, walkers, the use of special transportation or the assistance of another person to leave their place of residence. There is a normal inability to leave the home and doing so requires considerable and taxing effort. Other absences are for medical reasons / religious services and are infrequent or of short duration when for other reasons). FACE TO FACE ENCOUNTER: MEDICARE and MEDICAID PATIENTS: I certify that this patient is under my care and that I had a face-to-face encounter that meets  the physician face-to-face encounter requirements with this patient on this date. The encounter with the patient was in whole or in part for the [place], [name] [name] ([id number]) following MEDICAL CONDITION: (primary reason for [place]) MEDICAL NECESSITY: I certify, that based on my findings, NURSING services are a medically necessary home health service. HOME BOUND STATUS: I certify that my clinical findings support that this patient is homebound (i.e., Due to illness or injury, pt requires aid of supportive devices such as crutches, cane, wheelchairs, walkers, the use of special transportation or the assistance of another person to leave their place of residence. There is a normal inability to leave the  home and doing so requires considerable and taxing effort. Other absences are for medical reasons / religious services and are infrequent or of short duration when for other reasons). If current dressing causes regression in wound condition, may D/C ordered dressing product/s and apply Normal Saline Moist Dressing daily until next [place] / Other MD appointment. [place] of regression in wound condition at [phone]. If current dressing causes regression in wound condition, may D/C ordered dressing product/s and apply Normal Saline Moist Dressing daily until next [place] / Other MD appointment. [place] of regression in wound condition at [phone]. Please direct any NON-WOUND related issues/requests for orders to patient's Primary Care Physician Please direct any NON-WOUND related issues/requests for orders to patient's Primary Care Physician I have recommended silver alginate and Unna's boots to be applied and changed twice weekly. I have also recommended elevation and exercise and will order him juzo compression stockings with the Velcro wrap. he will come back and see as an regular basis Electronic Signature(s) Signed:  06/18/2015 2:46:58 PM By: Christin Fudge MD, FACS Entered By: Christin Fudge on 06/18/2015 14:46:58 [name] [name] ([id number]) -------------------------------------------------------------------------------- SuperBill Details Patient Name: [name] [name] Date of Service: 06/18/2015 Medical Record Number: [id number] Patient Account Number: [id number] Date of Birth/Sex: [birth year] (82 y.o. 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.248 Atherosclerosis of native arteries of left leg with ulceration of other part of lower left leg Chronic venous hypertension (idiopathic) with ulcer and inflammation of right lower I87.331 extremity Chronic venous hypertension (idiopathic) with ulcer and inflammation of left lower I87.332 extremity Facility Procedures CPT4 Code: [id number] Description: (Facility Use Only) [id number] - APPLY UNNA BOOT LT Modifier: Quantity: 1 CPT4 Code: [id number] Description: (Facility Use Only) [id number] - APPLY UNNA BOOT RT Modifier: Quantity: 1 Physician Procedures CPT4: Description Modifier Quantity Code [id number] - WC PHYS LEVEL 3 - EST PT 1 ICD-10 Description Diagnosis E11.622 Type 2 diabetes mellitus with other skin ulcer I89.0 Lymphedema, not elsewhere classified I70.238 Atherosclerosis of native arteries  of right leg with ulceration of other part of lower right leg I87.331 Chronic venous hypertension (idiopathic) with ulcer and inflammation of right lower extremity Electronic Signature(s) Signed: 06/18/2015 4:54:11 PM By: Ardean Larsen Signed: 06/18/2015 5:00:32 PM By:  Christin Fudge MD, FACS Previous Signature: 06/18/2015 2:47:21 PM Version By: Christin Fudge MD, FACS [name] [name] ([id number]) Entered By: Ardean Larsen on 06/18/2015 15:29:50

## 2015-06-19 NOTE — Progress Notes (Signed)
[name] [name], [name] [name] ([id number]) Visit Report for 06/18/2015 Arrival Information Details Patient Name: [name] [name], [name] [name] Date of Service: 06/18/2015 1:30 PM Medical Record Number: [id number] Patient Account Number: [id number] Date of Birth/Sex: [birth year] (82 y.o. Male) Treating RN: Macarthur Critchley Primary Care Physician: Alvester Chou Other Clinician: Referring Physician: Alvester Chou Treating Physician/Extender: Frann Rider in Treatment: 10 Visit Information History Since Last Visit All ordered tests and consults were completed: No Patient Arrived: [name] [name] Added or deleted any medications: No Arrival Time: 13:35 Any new allergies or adverse reactions: No Accompanied By: caregiver Had a fall or experienced change in No Transfer Assistance: None activities of daily living that may affect Patient Identification Verified: Yes risk of falls: Secondary Verification Process Yes Signs or symptoms of abuse/neglect since last No Completed: visito Patient Has Alerts: Yes Hospitalized since last visit: No Has Dressing in Place as Prescribed: Yes Has Compression in Place as Prescribed: Yes Pain Present Now: Yes Electronic Signature(s) Signed: 06/18/2015 4:54:11 PM By: Rebecca Eaton, RN, Sendra Entered By: Rebecca Eaton RN, Sendra on 06/18/2015 13:37:55 [name] [name] ([id number]) -------------------------------------------------------------------------------- Encounter Discharge Information Details Patient Name: [name] [name] Date of Service: 06/18/2015 1:30 PM Medical Record Number: [id number] Patient Account Number: [id number] Date of Birth/Sex: 06-Aug-1926 (82 y.o. Male) Treating RN: Macarthur Critchley Primary Care Physician: Alvester Chou Other Clinician: Referring Physician: Alvester Chou Treating Physician/Extender: Frann Rider in Treatment: 10 Encounter Discharge Information Items Facility Notification Discharge Pain Level: 0 Facility Type: Home Health Discharge Condition: Stable Orders Sent:  Yes Ambulatory Status: Walker Discharge Destination: Home Transportation: Private Auto Accompanied By: caregiver Schedule Follow-up Appointment: Yes Medication Reconciliation completed and provided to Patient/Care Yes [name] [name]: Provided on Clinical Summary of Care: 06/18/2015 Form Type Recipient Paper Patient [facility] Electronic Signature(s) Signed: 06/18/2015 4:54:11 PM By: Rebecca Eaton RN, Sendra Previous Signature: 06/18/2015 2:22:15 PM Version By: Ruthine Dose Entered By: Rebecca Eaton RN, Sendra on 06/18/2015 14:24:48 [name] [name] ([id number]) -------------------------------------------------------------------------------- Lower Extremity Assessment Details Patient Name: [name] [name] Date of Service: 06/18/2015 1:30 PM Medical Record Number: [id number] Patient Account Number: [id number] Date of Birth/Sex: 1927-03-20 (82 y.o. Male) Treating RN: Macarthur Critchley Primary Care Physician: Alvester Chou Other Clinician: Referring Physician: Alvester Chou Treating Physician/Extender: Frann Rider in Treatment: 10 Edema Assessment Assessed: [Left: No] [Right: No] E[Left: dema] [Right: :] Calf Left: Right: Point of Measurement: cm From Medial Instep 41.5 cm 41 cm Ankle Left: Right: Point of Measurement: cm From Medial Instep 26.2 cm 26.5 cm Vascular Assessment Pulses: Posterior Tibial Dorsalis Pedis Palpable: [Left:Yes] [Right:Yes] Extremity colors, hair growth, and conditions: Extremity Color: [Left:Mottled] [Right:Mottled] Hair Growth on Extremity: [Left:No] [Right:No] Temperature of Extremity: [Left:Warm] [Right:Warm] Capillary Refill: [Left:< 3 seconds] [Right:< 3 seconds] Toe Nail Assessment Left: Right: Thick: Yes Yes Discolored: No No Deformed: No No Improper Length and Hygiene: Yes Yes Electronic Signature(s) Signed: 06/18/2015 4:54:11 PM By: Rebecca Eaton, RN, Sendra Entered By: Rebecca Eaton RN, Sendra on 06/18/2015 13:52:41 [name] [name]  ([id number]) -------------------------------------------------------------------------------- Pain Assessment Details Patient Name: [name] [name] Date of Service: 06/18/2015 1:30 PM Medical Record Number: [id number] Patient Account Number: [id number] Date of Birth/Sex: Feb 08, 1927 (82 y.o. Male) Treating RN: Macarthur Critchley Primary Care Physician: Alvester Chou Other Clinician: Referring Physician: Alvester Chou Treating Physician/Extender: Frann Rider in Treatment: 10 Active Problems Location of Pain Severity and Description of Pain Patient Has Paino Yes Site Locations Rate the pain. Current Pain Level: 3 Pain Management and Medication Current Pain Management: Notes patient states HHRN is wrapping the compression wraps too tight Electronic Signature(s) Signed: 06/18/2015 4:54:11 PM By: Rebecca Eaton, RN, Romero Liner  By: Rebecca Eaton, RN, Roslynn Amble on 06/18/2015 13:38:29 [name] [name] ([id number]) -------------------------------------------------------------------------------- Patient/Caregiver Education Details Patient Name: [name] [name] Date of Service: 06/18/2015 1:30 PM Medical Record Number: [id number] Patient Account Number: [id number] Date of Birth/Gender: [birth year] (82 y.o. Male) Treating RN: Macarthur Critchley Primary Care Physician: Alvester Chou Other Clinician: Referring Physician: Alvester Chou Treating Physician/Extender: Frann Rider in Treatment: 10 Education Assessment Education Provided To: Patient Education Topics Provided Venous: Handouts: Other: unnaboot Methods: Explain/Verbal Responses: State content correctly Electronic Signature(s) Signed: 06/18/2015 4:54:11 PM By: Rebecca Eaton, RN, Sendra Entered By: Rebecca Eaton RN, Sendra on 06/18/2015 14:25:25 [name] [name] ([id number]) -------------------------------------------------------------------------------- Wound Assessment Details Patient Name: [name] [name] Date of Service: 06/18/2015 1:30 PM Medical Record Number:  [id number] Patient Account Number: [id number] Date of Birth/Sex: [birth year] (82 y.o. Male) Treating RN: Macarthur Critchley Primary Care Physician: Alvester Chou Other Clinician: Referring Physician: Alvester Chou Treating Physician/Extender: Frann Rider in Treatment: 10 Wound Status Wound Number: 1 Primary Lymphedema Etiology: Wound Location: Right Lower Leg - Circumfernential Wound Open Status: Wounding Event: Gradually Appeared Comorbid Congestive Heart Failure, Date Acquired: 12/28/2014 History: Hypertension, Type II Diabetes, Weeks Of Treatment: 10 Neuropathy Clustered Wound: Yes Photos Photo Uploaded By: Rebecca Eaton, RN, Roslynn Amble on 06/18/2015 16:50:24 Wound Measurements Length: (cm) 13 Width: (cm) 14 Depth: (cm) 0.1 Area: (cm) 142.942 Volume: (cm) 14.294 % Reduction in Area: 63.2% % Reduction in Volume: 63.2% Epithelialization: Large (67-100%) Tunneling: No Undermining: No Wound Description Classification: Partial Thickness Diabetic Severity (Wagner): Grade 0 Wound Margin: Indistinct, nonvisible Exudate Amount: Large Exudate Type: Serous Exudate Color: amber Foul Odor After Cleansing: No Wound Bed Granulation Amount: Large (67-100%) Exposed Structure Granulation Quality: Pink, Pale Fascia Exposed: No [name] [name], [name] [name] ([id number]) Necrotic Amount: Small (1-33%) Fat Layer Exposed: No Necrotic Quality: Adherent Slough Tendon Exposed: No Muscle Exposed: No Joint Exposed: No Bone Exposed: No Limited to Skin Breakdown Periwound Skin Texture Texture Color No Abnormalities Noted: No No Abnormalities Noted: No Callus: No Atrophie Blanche: No Crepitus: No Cyanosis: No Excoriation: No Ecchymosis: No Fluctuance: No Erythema: No Friable: No Hemosiderin Staining: No Induration: No Mottled: No Localized Edema: No Pallor: No Rash: No Rubor: No Scarring: No Temperature / Pain Moisture Temperature: No Abnormality No Abnormalities Noted: No Tenderness on  Palpation: Yes Dry / Scaly: No Maceration: Yes Moist: Yes Wound Preparation Ulcer Cleansing: Other: soap AND WATER, Topical Anesthetic Applied: None, Other: lidocaine 4%, Treatment Notes Wound #1 (Right, Circumferential Lower Leg) 1. Cleansed with: Cleanse wound with antibacterial soap and water 4. Dressing Applied: Aquacel Ag 5. Secondary Dressing Applied ABD Pad Dry Gauze 7. Secured with Water engineer) Signed: 06/18/2015 4:54:11 PM By: Rebecca Eaton, RN, Sendra Entered By: Rebecca Eaton RN, Sendra on 06/18/2015 13:53:46 [name] [name] ([id number]) -------------------------------------------------------------------------------- Wound Assessment Details Patient Name: [name] [name] Date of Service: 06/18/2015 1:30 PM Medical Record Number: [id number] Patient Account Number: [id number] Date of Birth/Sex: 01-Feb-1927 (82 y.o. Male) Treating RN: Macarthur Critchley Primary Care Physician: Alvester Chou Other Clinician: Referring Physician: Alvester Chou Treating Physician/Extender: Frann Rider in Treatment: 10 Wound Status Wound Number: 2 Primary Lymphedema Etiology: Wound Location: Left Lower Leg - Circumfernential Wound Open Status: Wounding Event: Gradually Appeared Comorbid Congestive Heart Failure, Date Acquired: 12/28/2014 History: Hypertension, Type II Diabetes, Weeks Of Treatment: 10 Neuropathy Clustered Wound: Yes Photos Photo Uploaded By: Rebecca Eaton, RN, Roslynn Amble on 06/18/2015 16:51:14 Wound Measurements Length: (cm) 15.5 Width: (cm) 26 Depth: (cm) 0.1 Area: (cm) 316.515 Volume: (cm) 31.652 % Reduction in Area: -19.9% % Reduction in Volume: -19.9% Epithelialization: Large (67-100%) Tunneling: No Undermining: No  Wound Description Classification: Partial Thickness Diabetic Severity [name] [name]): Grade 1 Wound Margin: Indistinct, nonvisibl Exudate Amount: Large Exudate Type: Serous Exudate Color: amber Foul Odor After Cleansing: Yes Due  to Product Use: No e Wound Bed Granulation Amount: Large (67-100%) Exposed Structure Granulation Quality: Red, Pink, Hyper-granulation Fascia Exposed: No [name] [name], [name] [name] ([id number]) Necrotic Amount: Small (1-33%) Fat Layer Exposed: No Necrotic Quality: Adherent Slough Tendon Exposed: No Muscle Exposed: No Joint Exposed: No Bone Exposed: No Limited to Skin Breakdown Periwound Skin Texture Texture Color No Abnormalities Noted: No No Abnormalities Noted: No Callus: No Atrophie Blanche: No Crepitus: No Cyanosis: No Excoriation: No Ecchymosis: No Fluctuance: No Erythema: No Friable: No Hemosiderin Staining: No Induration: No Mottled: No Localized Edema: Yes Pallor: No Rash: No Rubor: No Scarring: No Temperature / Pain Moisture Temperature: No Abnormality No Abnormalities Noted: No Tenderness on Palpation: Yes Dry / Scaly: No Maceration: Yes Moist: Yes Wound Preparation Ulcer Cleansing: Other: soap and water, Topical Anesthetic Applied: None, Other: lidocaine 4%, Treatment Notes Wound #2 (Left, Circumferential Lower Leg) 1. Cleansed with: Cleanse wound with antibacterial soap and water 4. Dressing Applied: Aquacel Ag 5. Secondary Dressing Applied ABD Pad Dry Gauze 7. Secured with Water engineer) Signed: 06/18/2015 4:54:11 PM By: Rebecca Eaton, RN, Sendra Entered By: Rebecca Eaton, RN, Sendra on 06/18/2015 13:55:15 [name] [name] ([id number]) -------------------------------------------------------------------------------- Wound Assessment Details Patient Name: [name] [name] Date of Service: 06/18/2015 1:30 PM Medical Record Number: [id number] Patient Account Number: [id number] Date of Birth/Sex: May 18, 1926 (82 y.o. Male) Treating RN: Macarthur Critchley Primary Care Physician: Alvester Chou Other Clinician: Referring Physician: Alvester Chou Treating Physician/Extender: Frann Rider in Treatment: 10 Wound Status Wound Number: 3 Primary  Lymphedema Etiology: Wound Location: Left Foot - Plantar Wound Open Wounding Event: Gradually Appeared Status: Date Acquired: 01/27/2015 Comorbid Congestive Heart Failure, Weeks Of Treatment: 10 History: Hypertension, Type II Diabetes, Clustered Wound: No Neuropathy Photos Photo Uploaded By: Rebecca Eaton, RN, Roslynn Amble on 06/18/2015 16:50:38 Wound Measurements Length: (cm) 1 Width: (cm) 3.8 Depth: (cm) 0.1 Area: (cm) 2.985 Volume: (cm) 0.298 % Reduction in Area: 60.4% % Reduction in Volume: 60.5% Epithelialization: Small (1-33%) Tunneling: No Undermining: No Wound Description Classification: Partial Thickness Diabetic Severity [name] [name]): Grade 0 Wound Margin: Indistinct, nonvisible Exudate Amount: Medium Exudate Type: Serous Exudate Color: amber Foul Odor After Cleansing: No Wound Bed Granulation Amount: Large (67-100%) Exposed Structure Granulation Quality: Pink Fascia Exposed: No Necrotic Amount: Small (1-33%) Fat Layer Exposed: No [name] [name], [name] [name] ([id number]) Necrotic Quality: Adherent Slough Tendon Exposed: No Muscle Exposed: No Joint Exposed: No Bone Exposed: No Limited to Skin Breakdown Periwound Skin Texture Texture Color No Abnormalities Noted: No No Abnormalities Noted: No Callus: No Atrophie Blanche: No Crepitus: No Cyanosis: No Excoriation: No Ecchymosis: No Fluctuance: No Erythema: No Friable: No Hemosiderin Staining: No Induration: No Mottled: No Localized Edema: No Pallor: No Rash: No Rubor: No Scarring: No Temperature / Pain Moisture Temperature: No Abnormality No Abnormalities Noted: No Dry / Scaly: No Maceration: Yes Moist: Yes Wound Preparation Ulcer Cleansing: Other: water and soap, Topical Anesthetic Applied: Other: lidocaine 4%, Treatment Notes Wound #3 (Left, Plantar Foot) 1. Cleansed with: Cleanse wound with antibacterial soap and water 4. Dressing Applied: Aquacel Ag 5. Secondary Dressing Applied ABD Pad Dry Gauze 7.  Secured with Water engineer) Signed: 06/18/2015 4:54:11 PM By: Rebecca Eaton, RN, Sendra Entered By: Rebecca Eaton RN, Sendra on 06/18/2015 13:55:35 [name] [name] ([id number]) -------------------------------------------------------------------------------- Vitals Details Patient Name: [name] [name] Date of Service: 06/18/2015 1:30 PM Medical Record Number: [id number] Patient Account Number:  [id number] Date of Birth/Sex: [birth year] (82 y.o. Male) Treating RN: Macarthur Critchley Primary Care Physician: Alvester Chou Other Clinician: Referring Physician: Alvester Chou Treating Physician/Extender: Frann Rider in Treatment: 10 Vital Signs Time Taken: 13:49 Temperature (F): 97.9 Height (in): 70 Reference Range: 80 - 120 mg / dl Weight (lbs): 230 Body Mass Index (BMI): 33 Electronic Signature(s) Signed: 06/18/2015 4:54:11 PM By: Rebecca Eaton, RN, Sendra Entered By: Rebecca Eaton RN, Sendra on 06/18/2015 13:50:00

## 2015-06-29 ENCOUNTER — Encounter (HOSPITAL_BASED_OUTPATIENT_CLINIC_OR_DEPARTMENT_OTHER): Payer: Medicare Other

## 2015-07-02 ENCOUNTER — Encounter: Payer: Medicare Other | Attending: Surgery | Admitting: Surgery

## 2015-07-02 DIAGNOSIS — N189 Chronic kidney disease, unspecified: Secondary | ICD-10-CM | POA: Insufficient documentation

## 2015-07-02 DIAGNOSIS — F0390 Unspecified dementia without behavioral disturbance: Secondary | ICD-10-CM | POA: Diagnosis not present

## 2015-07-02 DIAGNOSIS — L97811 Non-pressure chronic ulcer of other part of right lower leg limited to breakdown of skin: Secondary | ICD-10-CM | POA: Diagnosis not present

## 2015-07-02 DIAGNOSIS — I890 Lymphedema, not elsewhere classified: Secondary | ICD-10-CM | POA: Insufficient documentation

## 2015-07-02 DIAGNOSIS — I129 Hypertensive chronic kidney disease with stage 1 through stage 4 chronic kidney disease, or unspecified chronic kidney disease: Secondary | ICD-10-CM | POA: Diagnosis not present

## 2015-07-02 DIAGNOSIS — I70238 Atherosclerosis of native arteries of right leg with ulceration of other part of lower right leg: Secondary | ICD-10-CM | POA: Diagnosis not present

## 2015-07-02 DIAGNOSIS — I87331 Chronic venous hypertension (idiopathic) with ulcer and inflammation of right lower extremity: Secondary | ICD-10-CM | POA: Diagnosis not present

## 2015-07-02 DIAGNOSIS — L97821 Non-pressure chronic ulcer of other part of left lower leg limited to breakdown of skin: Secondary | ICD-10-CM | POA: Insufficient documentation

## 2015-07-02 DIAGNOSIS — I87332 Chronic venous hypertension (idiopathic) with ulcer and inflammation of left lower extremity: Secondary | ICD-10-CM | POA: Insufficient documentation

## 2015-07-02 DIAGNOSIS — E1140 Type 2 diabetes mellitus with diabetic neuropathy, unspecified: Secondary | ICD-10-CM | POA: Insufficient documentation

## 2015-07-02 DIAGNOSIS — E11622 Type 2 diabetes mellitus with other skin ulcer: Secondary | ICD-10-CM | POA: Insufficient documentation

## 2015-07-02 DIAGNOSIS — I70248 Atherosclerosis of native arteries of left leg with ulceration of other part of lower left leg: Secondary | ICD-10-CM | POA: Diagnosis not present

## 2015-07-02 NOTE — Progress Notes (Signed)
CARRON, NEE ([id number]) Visit Report for 07/02/2015 Arrival Information Details Patient Name: [name] [name], [name] [name] Date of Service: 07/02/2015 12:45 PM Medical Record Number: [id number] Patient Account Number: [id number] Date of Birth/Sex: Jul 06, 1926 (82 y.o. Male) Treating RN: [name] [name] Primary Care Physician: [name] [name] Other Clinician: Referring Physician: Alvester [name] Treating Physician/Extender: [name] [name] in Treatment: 12 Visit Information History Since Last Visit All ordered tests and consults were completed: No Patient Arrived: [name] Added or deleted any medications: No Arrival Time: 12:46 Any new allergies or adverse reactions: No Accompanied By: caregiver Had a fall or experienced change in No Transfer Assistance: None activities of daily living that may affect Patient Identification Verified: Yes risk of falls: Secondary Verification Process Yes Signs or symptoms of abuse/neglect since last No Completed: visito Patient Has Alerts: Yes Hospitalized since last visit: No Has Dressing in Place as Prescribed: Yes Has Compression in Place as Prescribed: Yes Pain Present Now: No Electronic Signature(s) Signed: 07/02/2015 3:39:32 PM By: [name] Eaton, RN, [name] [name] Entered By: [name] Eaton RN, [name] [name] on 07/02/2015 12:46:53 [name] [name] ([id number]) -------------------------------------------------------------------------------- Encounter Discharge Information Details Patient Name: [name] [name] Date of Service: 07/02/2015 12:45 PM Medical Record Number: [id number] Patient Account Number: [id number] Date of Birth/Sex: 1926/11/01 (82 y.o. Male) Treating RN: [name] [name] Primary Care Physician: [name] [name] Other Clinician: Referring Physician: Alvester [name] Treating Physician/Extender: [name] [name] in Treatment: 12 Encounter Discharge Information Items Facility Notification Discharge Pain Level: 0 Facility Type: Home Health Discharge Condition: Stable Orders Sent:  Yes Ambulatory Status: Walker Discharge Destination: Home Private Transportation: Auto Accompanied By: caregiver Schedule Follow-up Appointment: Yes Medication Reconciliation completed and Yes provided to Patient/Care [name]: Clinical Summary of Care: Electronic Signature(s) Signed: 07/02/2015 3:39:32 PM By: [name] Eaton, RN, [name] [name] Entered By: [name] Eaton, RN, [name] [name] on 07/02/2015 13:11:43 [name] [name] ([id number]) -------------------------------------------------------------------------------- Lower Extremity Assessment Details Patient Name: [name] [name] Date of Service: 07/02/2015 12:45 PM Medical Record Number: [id number] Patient Account Number: [id number] Date of Birth/Sex: 19-Apr-1927 (82 y.o. Male) Treating RN: [name] [name] Primary Care Physician: [name] [name] Other Clinician: Referring Physician: Alvester [name] Treating Physician/Extender: [name] [name] in Treatment: 12 Edema Assessment Assessed: [Left: No] [Right: No] E[Left: dema] [Right: :] Calf Left: Right: Point of Measurement: 32 cm From Medial Instep 37.5 cm 36 cm Ankle Left: Right: Point of Measurement: 12 cm From Medial Instep 27.4 cm 29.5 cm Vascular Assessment Pulses: Posterior Tibial Dorsalis Pedis Palpable: [Left:Yes] [Right:Yes] Extremity colors, hair growth, and conditions: Extremity Color: [Left:Mottled] [Right:Mottled] Hair Growth on Extremity: [Left:No] [Right:No] Temperature of Extremity: [Left:Warm] [Right:Warm] Capillary Refill: [Left:< 3 seconds] [Right:< 3 seconds] Toe Nail Assessment Left: Right: Thick: Yes Yes Discolored: No No Deformed: No No Improper Length and Hygiene: No No Electronic Signature(s) Signed: 07/02/2015 3:39:32 PM By: [name] Eaton, RN, [name] [name] Entered By: [name] Eaton RN, [name] [name] on 07/02/2015 12:58:32 [name] [name] ([id number]) -------------------------------------------------------------------------------- Pain Assessment Details Patient Name: [name] [name] Date of Service:  07/02/2015 12:45 PM Medical Record Number: [id number] Patient Account Number: [id number] Date of Birth/Sex: 05-08-1926 (82 y.o. Male) Treating RN: [name] [name] Primary Care Physician: [name] [name] Other Clinician: Referring Physician: Alvester [name] Treating Physician/Extender: [name] [name] in Treatment: 12 Active Problems Location of Pain Severity and Description of Pain Patient Has Paino No Site Locations Rate the pain. Current Pain Level: 0 Pain Management and Medication Current Pain Management: Electronic Signature(s) Signed: 07/02/2015 3:39:32 PM By: [name] Eaton, RN, [name] [name] Entered By: [name] Eaton RN, [name] [name] on 07/02/2015 12:47:03 [name] [name] ([id number]) -------------------------------------------------------------------------------- Patient/Caregiver Education Details Patient Name: [name] [name] Date of Service: 07/02/2015 12:45 PM Medical Record Number:  [id number] Patient Account Number: [id number] Date of Birth/Gender: [birth year] (82 y.o. Male) Treating RN: [name] [name] Primary Care Physician: [name] [name] Other Clinician: Referring Physician: Alvester [name] Treating Physician/Extender: [name] [name] in Treatment: 12 Education Assessment Education Provided To: Patient Education Topics Provided Wound/Skin Impairment: Handouts: Caring for Your Ulcer, Skin Care Do's and Dont's Methods: Explain/Verbal Responses: State content correctly Electronic Signature(s) Signed: 07/02/2015 3:39:32 PM By: [name] Eaton, RN, [name] [name] Entered By: [name] Eaton, RN, [name] [name] on 07/02/2015 13:09:07 [name] [name] ([id number]) -------------------------------------------------------------------------------- Wound Assessment Details Patient Name: [name] [name] Date of Service: 07/02/2015 12:45 PM Medical Record Number: [id number] Patient Account Number: [id number] Date of Birth/Sex: 1926-10-04 (82 y.o. Male) Treating RN: [name] [name] Primary Care Physician: [name] [name] Other Clinician: Referring  Physician: Alvester [name] Treating Physician/Extender: [name] [name] in Treatment: 12 Wound Status Wound Number: 1 Primary Lymphedema Etiology: Wound Location: Right Lower Leg - Circumfernential Wound Open Status: Wounding Event: Gradually Appeared Comorbid Congestive Heart Failure, Date Acquired: 12/28/2014 History: Hypertension, Type II Diabetes, Weeks Of Treatment: 12 Neuropathy Clustered Wound: Yes Photos Photo Uploaded By: [name] Eaton, RN, Roslynn Amble on 07/02/2015 15:33:58 Wound Measurements Length: (cm) 6.4 Width: (cm) 6.4 Depth: (cm) 0.1 Area: (cm) 32.17 Volume: (cm) 3.217 % Reduction in Area: 91.7% % Reduction in Volume: 91.7% Epithelialization: Large (67-100%) Tunneling: No Undermining: No Wound Description Classification: Partial Thickness Diabetic Severity Earleen Newport): Grade 0 Wound Margin: Indistinct, nonvisible Exudate Amount: Large Exudate Type: Serous Exudate Color: amber Foul Odor After Cleansing: No Wound Bed Granulation Amount: Large (67-100%) Exposed Structure Granulation Quality: Pink, Pale Fascia Exposed: No [name] [name], [name] [name] ([id number]) Necrotic Amount: Small (1-33%) Fat Layer Exposed: No Necrotic Quality: Adherent Slough Tendon Exposed: No Muscle Exposed: No Joint Exposed: No Bone Exposed: No Limited to Skin Breakdown Periwound Skin Texture Texture Color No Abnormalities Noted: No No Abnormalities Noted: No Callus: No Atrophie Blanche: No Crepitus: No Cyanosis: No Excoriation: No Ecchymosis: No Fluctuance: No Erythema: No Friable: No Hemosiderin Staining: No Induration: No Mottled: No Localized Edema: No Pallor: No Rash: No Rubor: No Scarring: No Temperature / Pain Moisture Temperature: No Abnormality No Abnormalities Noted: No Tenderness on Palpation: Yes Dry / Scaly: No Maceration: Yes Moist: Yes Wound Preparation Ulcer Cleansing: Other: soap AND WATER, Topical Anesthetic Applied: None Treatment Notes Wound #1 (Right,  Circumferential Lower Leg) 1. Cleansed with: Cleanse wound with antibacterial soap and water 2. Anesthetic Topical Lidocaine 4% cream to wound bed prior to debridement 4. Dressing Applied: Aquacel Ag 5. Secondary Dressing Applied ABD Pad 7. Secured with Water engineer) Signed: 07/02/2015 3:39:32 PM By: [name] Eaton, RN, [name] [name] Entered By: [name] Eaton RN, [name] [name] on 07/02/2015 13:01:57 [name] [name], [name] [name] ([id number]) [name] [name], [name] [name] ([id number]) -------------------------------------------------------------------------------- Wound Assessment Details Patient Name: [name] [name] Date of Service: 07/02/2015 12:45 PM Medical Record Number: [id number] Patient Account Number: [id number] Date of Birth/Sex: 06-Oct-1926 (82 y.o. Male) Treating RN: [name] [name] Primary Care Physician: [name] [name] Other Clinician: Referring Physician: Alvester [name] Treating Physician/Extender: [name] [name] in Treatment: 12 Wound Status Wound Number: 2 Primary Lymphedema Etiology: Wound Location: Left Lower Leg - Circumfernential Wound Open Status: Wounding Event: Gradually Appeared Comorbid Congestive Heart Failure, Date Acquired: 12/28/2014 History: Hypertension, Type II Diabetes, Weeks Of Treatment: 12 Neuropathy Clustered Wound: Yes Photos Photo Uploaded By: [name] Eaton RN, Roslynn Amble on 07/02/2015 15:34:31 Wound Measurements Length: (cm) 15 Width: (cm) 21 Depth: (cm) 0.1 Area: (cm) 247.4 Volume: (cm) 24.74 % Reduction in Area: 6.3% % Reduction in Volume: 6.2% Epithelialization: Large (67-100%) Tunneling: No Undermining: No Wound Description Classification: Partial Thickness Diabetic Severity Earleen Newport): Grade  1 Wound Margin: Indistinct, nonvisibl Exudate Amount: Large Exudate Type: Serous Exudate Color: amber Foul Odor After Cleansing: Yes Due to Product Use: No e Wound Bed Granulation Amount: Large (67-100%) Exposed Structure Granulation Quality: Red, Pink,  Hyper-granulation Fascia Exposed: No [name] [name], [name] [name] ([id number]) Necrotic Amount: Small (1-33%) Fat Layer Exposed: No Necrotic Quality: Adherent Slough Tendon Exposed: No Muscle Exposed: No Joint Exposed: No Bone Exposed: No Limited to Skin Breakdown Periwound Skin Texture Texture Color No Abnormalities Noted: No No Abnormalities Noted: No Callus: No Atrophie Blanche: No Crepitus: No Cyanosis: No Excoriation: No Ecchymosis: No Fluctuance: No Erythema: No Friable: No Hemosiderin Staining: No Induration: No Mottled: No Localized Edema: Yes Pallor: No Rash: No Rubor: No Scarring: No Temperature / Pain Moisture Temperature: No Abnormality No Abnormalities Noted: No Tenderness on Palpation: Yes Dry / Scaly: No Maceration: Yes Moist: Yes Wound Preparation Ulcer Cleansing: Other: soap and water, Topical Anesthetic Applied: None, Other: lidocaine 4%, Treatment Notes Wound #2 (Left, Circumferential Lower Leg) 1. Cleansed with: Cleanse wound with antibacterial soap and water 2. Anesthetic Topical Lidocaine 4% cream to wound bed prior to debridement 4. Dressing Applied: Aquacel Ag 5. Secondary Dressing Applied ABD Pad 7. Secured with Water engineer) Signed: 07/02/2015 3:39:32 PM By: [name] Eaton, RN, [name] [name] Entered By: [name] Eaton, RN, [name] [name] on 07/02/2015 13:02:07 [name] [name], [name] [name] ([id number]) [name] [name], [name] [name] ([id number]) -------------------------------------------------------------------------------- Wound Assessment Details Patient Name: [name] [name] Date of Service: 07/02/2015 12:45 PM Medical Record Number: [id number] Patient Account Number: [id number] Date of Birth/Sex: 05/17/1926 (82 y.o. Male) Treating RN: [name] [name] Primary Care Physician: [name] [name] Other Clinician: Referring Physician: Alvester [name] Treating Physician/Extender: [name] [name] in Treatment: 12 Wound Status Wound Number: 3 Primary Lymphedema Etiology: Wound  Location: Left Foot - Plantar Wound Open Wounding Event: Gradually Appeared Status: Date Acquired: 01/27/2015 Comorbid Congestive Heart Failure, Weeks Of Treatment: 12 History: Hypertension, Type II Diabetes, Clustered Wound: No Neuropathy Photos Photo Uploaded By: [name] Eaton, RN, Roslynn Amble on 07/02/2015 15:34:32 Wound Measurements Length: (cm) 1 Width: (cm) 3.5 Depth: (cm) 0.1 Area: (cm) 2.749 Volume: (cm) 0.275 % Reduction in Area: 63.5% % Reduction in Volume: 63.5% Epithelialization: Small (1-33%) Wound Description Classification: Partial Thickness Diabetic Severity Earleen Newport): Grade 0 Wound Margin: Indistinct, nonvisible Exudate Amount: Medium Exudate Type: Serous Exudate Color: amber Foul Odor After Cleansing: No Wound Bed Granulation Amount: Large (67-100%) Exposed Structure Granulation Quality: Pink Fascia Exposed: No Necrotic Amount: Small (1-33%) Fat Layer Exposed: No [name] [name], [name] [name] ([id number]) Necrotic Quality: Adherent Slough Tendon Exposed: No Muscle Exposed: No Joint Exposed: No Bone Exposed: No Limited to Skin Breakdown Periwound Skin Texture Texture Color No Abnormalities Noted: No No Abnormalities Noted: No Callus: No Atrophie Blanche: No Crepitus: No Cyanosis: No Excoriation: No Ecchymosis: No Fluctuance: No Erythema: No Friable: No Hemosiderin Staining: No Induration: No Mottled: No Localized Edema: No Pallor: No Rash: No Rubor: No Scarring: No Temperature / Pain Moisture Temperature: No Abnormality No Abnormalities Noted: No Dry / Scaly: No Maceration: Yes Moist: Yes Wound Preparation Ulcer Cleansing: Other: water and soap, Topical Anesthetic Applied: Other: lidocaine 4%, Treatment Notes Wound #3 (Left, Plantar Foot) 1. Cleansed with: Cleanse wound with antibacterial soap and water 2. Anesthetic Topical Lidocaine 4% cream to wound bed prior to debridement 4. Dressing Applied: Aquacel Ag 5. Secondary Dressing Applied ABD  Pad 7. Secured with Water engineer) Signed: 07/02/2015 3:39:32 PM By: [name] Eaton, RN, [name] [name] Entered By: [name] Eaton RN, [name] [name] on 07/02/2015 13:02:15 [name] [name] ([id number]) -------------------------------------------------------------------------------- Vitals Details Patient Name: [name] [name] Date of Service:  07/02/2015 12:45 PM Medical Record Number: [id number] Patient Account Number: [id number] Date of Birth/Sex: October 20, 1926 (82 y.o. Male) Treating RN: [name] [name] Primary Care Physician: [name] [name] Other Clinician: Referring Physician: Alvester [name] Treating Physician/Extender: [name] [name] in Treatment: 12 Vital Signs Time Taken: 12:45 Temperature (F): 98.0 Height (in): 70 Pulse (bpm): 78 Weight (lbs): 230 Blood Pressure (mmHg): 126/96 Body Mass Index (BMI): 33 Reference Range: 80 - 120 mg / dl Electronic Signature(s) Signed: 07/02/2015 3:39:32 PM By: [name] Eaton RN, [name] [name] Entered By: [name] Eaton RN, [name] [name] on 07/02/2015 13:30:23

## 2015-07-02 NOTE — Progress Notes (Addendum)
CEM, COLUCCI ([id number]) Visit Report for 07/02/2015 Chief Complaint Document Details Patient Name: [name] [name], [name] [name] Date of Service: 07/02/2015 12:45 PM Medical Record Number: [id number] Patient Account Number: [id number] Date of Birth/Sex: Mar 23, 1927 (82 y.o. Male) Treating RN: Macarthur Critchley Primary Care Physician: Alvester Chou Other Clinician: Referring Physician: Alvester Chou Treating Physician/Extender: Frann Rider in Treatment: 12 Information Obtained from: Patient Chief Complaint Patient presents to the wound care center for a consult due non healing wound both lower extremities. The patient is a poor historian and was seen here once in July 2016. He says home health was doing his dressings and he was healed but now has recurrence of the problem. Problem has been there at least for 6 months. Electronic Signature(s) Signed: 07/02/2015 1:18:11 PM By: Christin Fudge MD, FACS Entered By: Christin Fudge on 07/02/2015 13:18:11 [name] [name] ([id number]) -------------------------------------------------------------------------------- HPI Details Patient Name: [name] [name] Date of Service: 07/02/2015 12:45 PM Medical Record Number: [id number] Patient Account Number: [id number] Date of Birth/Sex: April 27, 1927 (82 y.o. Male) Treating RN: Macarthur Critchley Primary Care Physician: Alvester Chou Other Clinician: Referring Physician: Alvester Chou Treating Physician/Extender: Frann Rider in Treatment: 12 History of Present Illness Location: swelling of the legs right more than left an ulceration both lower extremities Quality: Patient reports experiencing a dull pain to affected area(s). Severity: Patient states wound (s) are getting worse Duration: Patient has had the wound for > 6 months prior to seeking treatment at the wound center Timing: Pain in wound is Intermittent. Context: The wound appeared gradually over time Modifying Factors: he has been having home health come and apply his  dressing daily was healed. Associated Signs and Symptoms: Patient reports having difficulty standing for long periods. HPI Description: [name] [name] is a 82 y.o. male with history of DM, hypertension, chronic kidney disease, gout, diastolic dysfunction and the patient was having increasing swelling with increasing fluid draining from the legs and pain over the last 6 weeks. He says his wounds are completely healed and the home health had stopped coming to do his dressings. Recent hemoglobin A1c was 6.6. During the last admission A venous duplex study revealed no evidence of deep vein thrombosis or superficial thrombosis involving the right and left lower extremity. Was seen in January 2016 by Dr. Adele Barthel at [facility]: Non-Invasive Vascular Imaging ABI (05/05/2014) o R: 0.65 (0.73), DP: mono, PT: mono, TBI: 0.57 o L: 0.65 (0.69), DP: mono, PT: mono, TBI: 0.36 The patient had the diagnosis of bilateral lower extremity moderate peripheral atherosclerotic disease, bilateral lower extremity chronic venous insufficiency. Due to the patient's age and mental status conservative therapy was only discussed and no surgical intervention was planned. The patient has significant dementia and has a caregiver who spends a few hours every day coming to see him. He does not have any family members. 05/21/2015 -- lower extremity arterial studies done on 05/17/2015 show the right ABI is 0.50-0.79 indicating moderate arterial occlusive disease at rest. The left ABI was 0.5-0.79 indicating moderate arterial occlusive disease at rest. The left posterior tibial and peroneal arteries were not evaluated due to local wound condition. The right TBI was less than 0.69 and the left ABI was less than 0.69 which are both abnormal. Since it's been a year since the previous opinion from Dr. Bridgett Larsson I have asked him to see him for a review. 05/28/2015 -- was seen by Dr. Bridgett Larsson and he recommended that he was not a good candidate for  surgery and surgical revascularization if the need develops. He  recommends to continue with compressive therapy and wound care. 06/04/2015 -- had asked him to remove his compression wraps have a shower and come to see as but he has probably been scratching his legs and caused a lot of excoriation and has multiple open ulcers which [name] [name], [name] [name] ([id number]) are much more worse than before. The patient is not very compliant and may be this is because of his dementia. Electronic Signature(s) Signed: 07/02/2015 1:18:17 PM By: Christin Fudge MD, FACS Entered By: Christin Fudge on 07/02/2015 13:18:17 [name] [name] ([id number]) -------------------------------------------------------------------------------- Physical Exam Details Patient Name: [name] [name] Date of Service: 07/02/2015 12:45 PM Medical Record Number: [id number] Patient Account Number: [id number] Date of Birth/Sex: [birth year] (82 y.o. Male) Treating RN: Macarthur Critchley Primary Care Physician: Alvester Chou Other Clinician: Referring Physician: Alvester Chou Treating Physician/Extender: Frann Rider in Treatment: 12 Constitutional . Pulse regular. Respirations normal and unlabored. Afebrile. . Eyes Nonicteric. Reactive to light. Ears, Nose, Mouth, and Throat Lips, teeth, and gums WNL.Marland Kitchen Moist mucosa without lesions. Neck supple and nontender. No palpable supraclavicular or cervical adenopathy. Normal sized without goiter. Respiratory WNL. No retractions.. Cardiovascular Pedal Pulses WNL. No clubbing, cyanosis or edema. Lymphatic No adneopathy. No adenopathy. No adenopathy. Musculoskeletal Adexa without tenderness or enlargement.. Digits and nails w/o clubbing, cyanosis, infection, petechiae, ischemia, or inflammatory conditions.. Integumentary (Hair, Skin) No suspicious lesions. No crepitus or fluctuance. No peri-wound warmth or erythema. No masses.Marland Kitchen Psychiatric Judgement and insight Intact.. No evidence of depression,  anxiety, or agitation.. Notes the excoriation on the right lower extremity looks much better and his edema is well controlled. On the left side he has quite a large area which is open and as usual his hygiene is very poor Engineer, maintenance) Signed: 07/02/2015 1:18:58 PM By: Christin Fudge MD, FACS Entered By: Christin Fudge on 07/02/2015 13:18:57 [name] [name] ([id number]) -------------------------------------------------------------------------------- Physician Orders Details Patient Name: [name] [name] Date of Service: 07/02/2015 12:45 PM Medical Record Number: [id number] Patient Account Number: [id number] Date of Birth/Sex: [birth year] (82 y.o. Male) Treating RN: Macarthur Critchley Primary Care Physician: Alvester Chou Other Clinician: Referring Physician: Alvester Chou Treating Physician/Extender: Frann Rider in Treatment: 12 Verbal / Phone Orders: Yes Clinician: Macarthur Critchley Read Back and Verified: Yes Diagnosis Coding Wound Cleansing Wound #1 Right,Circumferential Lower Leg o May shower with protection. Wound #2 Left,Circumferential Lower Leg o May shower with protection. Wound #3 Left,Plantar Foot o May shower with protection. Primary Wound Dressing Wound #1 Right,Circumferential Lower Leg o Aquacel Ag Wound #2 Left,Circumferential Lower Leg o Aquacel Ag Wound #3 Left,Plantar Foot o Aquacel Ag Secondary Dressing Wound #1 Right,Circumferential Lower Leg o ABD pad o Dry Gauze Wound #2 Left,Circumferential Lower Leg o ABD pad o Dry Gauze Wound #3 Left,Plantar Foot o ABD pad o Dry Gauze Dressing Change Frequency Wound #1 Right,Circumferential Lower Leg o Three times weekly [name] [name], [name] [name] ([id number]) Wound #2 Left,Circumferential Lower Leg o Three times weekly Wound #3 Left,Plantar Foot o Three times weekly Follow-up Appointments Wound #1 Right,Circumferential Lower Leg o Return Appointment in 2 weeks. - HHRN to come 3x/week next  week Wound #2 Left,Circumferential Lower Leg o Return Appointment in 2 weeks. - HHRN to come 3x/week next week Wound #3 Left,Plantar Foot o Return Appointment in 2 weeks. - HHRN to come 3x/week next week Edema Control Wound #1 Right,Circumferential Lower Leg o Unna Boots Bilaterally o Other: - bring wraps that was shipped to house Wound #2 Left,Circumferential Lower Leg o Unna Boots Bilaterally o Other: - bring wraps that was shipped to house  Wound #3 Left,Plantar Foot o Unna Boots Bilaterally o Other: - bring wraps that was shipped to [place] #1 [place] please order Juzo compression wraps for patient. Measurements as follows: L ankle - 27cm, calf - 38cm; R ankle - 28cm, 37cm; heel to knee length 47cm o [place] please order Juzo compression wraps for patient. Measurements as follows: L ankle - 27cm, calf - 38cm; R ankle - 28cm, 37cm; heel to knee length 47cm o Home Health Nurse may visit PRN to address patientos wound care needs. o Home Health Nurse may visit PRN to address patientos wound care needs. o FACE TO FACE ENCOUNTER: MEDICARE and MEDICAID PATIENTS: I certify that this patient is under my care and that I had a face-to-face encounter that meets the physician face-to-face encounter requirements with this patient on this date. The encounter with the patient was in whole or in part for the following MEDICAL CONDITION: (primary reason for [place]) MEDICAL NECESSITY: I certify, that based on my findings, NURSING services are a medically necessary home health service. HOME BOUND STATUS: I certify that my clinical findings support that this patient is homebound (i.e., Due to illness or injury, pt requires aid of supportive devices such as crutches, cane, wheelchairs, walkers, the use of special [place], [name] [name]  ([id number]) transportation or the assistance of another person to leave their place of residence. There is a normal inability to leave the home and doing so requires considerable and taxing effort. Other absences are for medical reasons / religious services and are infrequent or of short duration when for other reasons). o FACE TO FACE ENCOUNTER: MEDICARE and MEDICAID PATIENTS: I certify that this patient is under my care and that I had a face-to-face encounter that meets the physician face-to-face encounter requirements with this patient on this date. The encounter with the patient was in whole or in part for the following MEDICAL CONDITION: (primary reason for [place]) MEDICAL NECESSITY: I certify, that based on my findings, NURSING services are a medically necessary home health service. HOME BOUND STATUS: I certify that my clinical findings support that this patient is homebound (i.e., Due to illness or injury, pt requires aid of supportive devices such as crutches, cane, wheelchairs, walkers, the use of special transportation or the assistance of another person to leave their place of residence. There is a normal inability to leave the home and doing so requires considerable and taxing effort. Other absences are for medical reasons / religious services and are infrequent or of short duration when for other reasons). o If current dressing causes regression in wound condition, may D/C ordered dressing product/s and apply Normal Saline Moist Dressing daily until next [place] / Other MD appointment. [place] of regression in wound condition at [phone]. o If current dressing causes regression in wound condition, may D/C ordered dressing product/s and apply Normal Saline Moist Dressing daily until next [place] / Other MD appointment. [place] of regression in wound condition at [phone]. o Please direct any  NON-WOUND related issues/requests for orders to patient's Primary Care Physician o Please direct any NON-WOUND related issues/requests for orders to patient's Primary Care Physician Wound #2 Left,Circumferential Lower Leg o [place] please order Juzo compression wraps for patient. Measurements as follows: L ankle - 27cm, calf - 38cm; R ankle -  28cm, 37cm; heel to knee length 47cm o [place] please order Juzo compression wraps for patient. Measurements as follows: L ankle - 27cm, calf - 38cm; R ankle - 28cm, 37cm; heel to knee length 47cm o Home Health Nurse may visit PRN to address patientos wound care needs. o Home Health Nurse may visit PRN to address patientos wound care needs. o FACE TO FACE ENCOUNTER: MEDICARE and MEDICAID PATIENTS: I certify that this patient is under my care and that I had a face-to-face encounter that meets the physician face-to-face encounter requirements with this patient on this date. The encounter with the patient was in whole or in part for the following MEDICAL CONDITION: (primary reason for [place]) MEDICAL NECESSITY: I certify, that based on my findings, NURSING services are a medically necessary home health service. HOME BOUND STATUS: I certify that my clinical findings support that this patient is homebound (i.e., Due to illness or injury, pt requires aid of supportive devices such as crutches, cane, wheelchairs, walkers, the use of special transportation or the assistance of another person to leave their place of residence. There is a normal inability to leave the home and doing so requires considerable and taxing effort. Other absences are for medical reasons / religious services and are infrequent or of short duration when for other reasons). o FACE TO FACE ENCOUNTER: MEDICARE and MEDICAID PATIENTS: I certify that this patient is under my care and that I had  a face-to-face encounter that meets the physician face-to-face [name] [name], [name] [name] ([id number]) encounter requirements with this patient on this date. The encounter with the patient was in whole or in part for the following MEDICAL CONDITION: (primary reason for [place]) MEDICAL NECESSITY: I certify, that based on my findings, NURSING services are a medically necessary home health service. HOME BOUND STATUS: I certify that my clinical findings support that this patient is homebound (i.e., Due to illness or injury, pt requires aid of supportive devices such as crutches, cane, wheelchairs, walkers, the use of special transportation or the assistance of another person to leave their place of residence. There is a normal inability to leave the home and doing so requires considerable and taxing effort. Other absences are for medical reasons / religious services and are infrequent or of short duration when for other reasons). o If current dressing causes regression in wound condition, may D/C ordered dressing product/s and apply Normal Saline Moist Dressing daily until next [place] / Other MD appointment. [place] of regression in wound condition at [phone]. o If current dressing causes regression in wound condition, may D/C ordered dressing product/s and apply Normal Saline Moist Dressing daily until next [place] / Other MD appointment. [place] of regression in wound condition at [phone]. o Please direct any NON-WOUND related issues/requests for orders to patient's Primary Care Physician o Please direct any NON-WOUND related issues/requests for orders to patient's Primary Care Physician Wound #3 [place] please order Juzo compression wraps for patient. Measurements as follows: L ankle - 27cm, calf - 38cm; R ankle - 28cm, 37cm; heel to knee length 47cm o  [place] please order Juzo compression wraps for patient. Measurements as follows: L ankle - 27cm, calf - 38cm; R ankle - 28cm, 37cm; heel to knee length 47cm o Home Health Nurse may visit PRN to address patientos wound care  needs. o Home Health Nurse may visit PRN to address patientos wound care needs. o FACE TO FACE ENCOUNTER: MEDICARE and MEDICAID PATIENTS: I certify that this patient is under my care and that I had a face-to-face encounter that meets the physician face-to-face encounter requirements with this patient on this date. The encounter with the patient was in whole or in part for the following MEDICAL CONDITION: (primary reason for [place]) MEDICAL NECESSITY: I certify, that based on my findings, NURSING services are a medically necessary home health service. HOME BOUND STATUS: I certify that my clinical findings support that this patient is homebound (i.e., Due to illness or injury, pt requires aid of supportive devices such as crutches, cane, wheelchairs, walkers, the use of special transportation or the assistance of another person to leave their place of residence. There is a normal inability to leave the home and doing so requires considerable and taxing effort. Other absences are for medical reasons / religious services and are infrequent or of short duration when for other reasons). o FACE TO FACE ENCOUNTER: MEDICARE and MEDICAID PATIENTS: I certify that this patient is under my care and that I had a face-to-face encounter that meets the physician face-to-face encounter requirements with this patient on this date. The encounter with the patient was in whole or in part for the following MEDICAL CONDITION: (primary reason for [place]) MEDICAL NECESSITY: I certify, that based on my findings, NURSING services are a medically necessary home health service. HOME BOUND STATUS: I certify that my clinical findings support  that this patient is homebound (i.e., Due to illness or injury, pt requires aid of supportive devices such as crutches, cane, wheelchairs, walkers, the use of special [place], [name] [name] ([id number]) transportation or the assistance of another person to leave their place of residence. There is a normal inability to leave the home and doing so requires considerable and taxing effort. Other absences are for medical reasons / religious services and are infrequent or of short duration when for other reasons). o If current dressing causes regression in wound condition, may D/C ordered dressing product/s and apply Normal Saline Moist Dressing daily until next [place] / Other MD appointment. [place] of regression in wound condition at [phone]. o If current dressing causes regression in wound condition, may D/C ordered dressing product/s and apply Normal Saline Moist Dressing daily until next [place] / Other MD appointment. [place] of regression in wound condition at [phone]. o Please direct any NON-WOUND related issues/requests for orders to patient's Primary Care Physician o Please direct any NON-WOUND related issues/requests for orders to patient's Primary Care Physician Electronic Signature(s) Signed: 07/02/2015 3:39:32 PM By: Ardean Larsen Signed: 07/02/2015 3:57:47 PM By: Christin Fudge MD, FACS Entered By: Rebecca Eaton RN, [place] on 07/02/2015 13:16:25 [name] [name] ([id number]) -------------------------------------------------------------------------------- Problem List Details Patient Name: [name] [name] Date of Service: 07/02/2015 12:45 PM Medical Record Number: [id number] Patient Account Number: [id number] Date of Birth/Sex: 25-Jun-1926 (82 y.o. Male) Treating RN: Macarthur Critchley Primary Care Physician: Alvester Chou Other Clinician: Referring Physician: Alvester Chou Treating Physician/Extender: Frann Rider in  Treatment: 12 Active Problems ICD-10 Encounter Code Description Active Date Diagnosis E11.622 Type 2 diabetes mellitus with other skin ulcer 04/09/2015 Yes I89.0 Lymphedema, not elsewhere classified 04/09/2015 Yes I70.238 Atherosclerosis of native arteries of right leg with 04/09/2015 Yes ulceration of other part of lower right leg I70.248 Atherosclerosis of native arteries of left leg with ulceration 04/09/2015 Yes of other part  of lower left leg I87.331 Chronic venous hypertension (idiopathic) with ulcer and 04/09/2015 Yes inflammation of right lower extremity I87.332 Chronic venous hypertension (idiopathic) with ulcer and 04/09/2015 Yes inflammation of left lower extremity Inactive Problems Resolved Problems Electronic Signature(s) Signed: 07/02/2015 1:17:53 PM By: Christin Fudge MD, FACS Entered By: Christin Fudge on 07/02/2015 13:17:53 [name] [name] ([id number]) -------------------------------------------------------------------------------- Progress Note Details Patient Name: [name] [name] Date of Service: 07/02/2015 12:45 PM Medical Record Number: [id number] Patient Account Number: [id number] Date of Birth/Sex: [birth year] (82 y.o. Male) Treating RN: Macarthur Critchley Primary Care Physician: Alvester Chou Other Clinician: Referring Physician: Alvester Chou Treating Physician/Extender: Frann Rider in Treatment: 12 Subjective Chief Complaint Information obtained from Patient Patient presents to the wound care center for a consult due non healing wound both lower extremities. The patient is a poor historian and was seen here once in July 2016. He says home health was doing his dressings and he was healed but now has recurrence of the problem. Problem has been there at least for 6 months. History of Present Illness (HPI) The following HPI elements were documented for the patient's wound: Location: swelling of the legs right more than left an ulceration both lower  extremities Quality: Patient reports experiencing a dull pain to affected area(s). Severity: Patient states wound (s) are getting worse Duration: Patient has had the wound for > 6 months prior to seeking treatment at the wound center Timing: Pain in wound is Intermittent. Context: The wound appeared gradually over time Modifying Factors: he has been having home health come and apply his dressing daily was healed. Associated Signs and Symptoms: Patient reports having difficulty standing for long periods. Reg Uhland is a 82 y.o. male with history of DM, hypertension, chronic kidney disease, gout, diastolic dysfunction and the patient was having increasing swelling with increasing fluid draining from the legs and pain over the last 6 weeks. He says his wounds are completely healed and the home health had stopped coming to do his dressings. Recent hemoglobin A1c was 6.6. During the last admission A venous duplex study revealed no evidence of deep vein thrombosis or superficial thrombosis involving the right and left lower extremity. Was seen in January 2016 by Dr. Adele Barthel at [facility]: Non-Invasive Vascular Imaging ABI (05/05/2014)  R: 0.65 (0.73), DP: mono, PT: mono, TBI: 0.57  L: 0.65 (0.69), DP: mono, PT: mono, TBI: 0.36 The patient had the diagnosis of bilateral lower extremity moderate peripheral atherosclerotic disease, bilateral lower extremity chronic venous insufficiency. Due to the patient's age and mental status conservative therapy was only discussed and no surgical intervention was planned. The patient has significant dementia and has a caregiver who spends a few hours every day coming to see him. He does not have any family members. 05/21/2015 -- lower extremity arterial studies done on 05/17/2015 show the right ABI is 0.50-0.79 indicating moderate arterial occlusive disease at rest. The left ABI was 0.5-0.79 indicating moderate arterial occlusive disease at rest. The left  posterior tibial and peroneal arteries were not evaluated due to local wound [place], [place] ([id number]) condition. The right TBI was less than 0.69 and the left ABI was less than 0.69 which are both abnormal. Since it's been a year since the previous opinion from Dr. Bridgett Larsson I have asked him to see him for a review. 05/28/2015 -- was seen by Dr. Bridgett Larsson and he recommended that he was not a good candidate for surgery and surgical revascularization if the need develops. He recommends to continue with compressive therapy and  wound care. 06/04/2015 -- had asked him to remove his compression wraps have a shower and come to see as but he has probably been scratching his legs and caused a lot of excoriation and has multiple open ulcers which are much more worse than before. The patient is not very compliant and may be this is because of his dementia. Objective Constitutional Pulse regular. Respirations normal and unlabored. Afebrile. Vitals Time Taken: 12:45 PM, Height: 70 in, Weight: 230 lbs, BMI: 33, Temperature: 98.0 F, Pulse: 78 bpm, Blood Pressure: 126/96 mmHg. Eyes Nonicteric. Reactive to light. Ears, Nose, Mouth, and Throat Lips, teeth, and gums WNL.Marland Kitchen Moist mucosa without lesions. Neck supple and nontender. No palpable supraclavicular or cervical adenopathy. Normal sized without goiter. Respiratory WNL. No retractions.. Cardiovascular Pedal Pulses WNL. No clubbing, cyanosis or edema. Lymphatic No adneopathy. No adenopathy. No adenopathy. Musculoskeletal Adexa without tenderness or enlargement.. Digits and nails w/o clubbing, cyanosis, infection, petechiae, ischemia, or inflammatory conditions.[facility] [name] [name], [name] [name] ([id number]) Judgement and insight Intact.. No evidence of depression, anxiety, or agitation.. General Notes: the excoriation on the right lower extremity looks much better and his edema is well controlled. On the left side he has quite a large area which is open and as  usual his hygiene is very poor Integumentary (Hair, Skin) No suspicious lesions. No crepitus or fluctuance. No peri-wound warmth or erythema. No masses.. Wound #1 status is Open. Original cause of wound was Gradually Appeared. The wound is located on the Right,Circumferential Lower Leg. The wound measures 6.4cm length x 6.4cm width x 0.1cm depth; 32.17cm^2 area and 3.217cm^3 volume. The wound is limited to skin breakdown. There is no tunneling or undermining noted. There is a large amount of serous drainage noted. The wound margin is indistinct and nonvisible. There is large (67-100%) pink, pale granulation within the wound bed. There is a small (1-33%) amount of necrotic tissue within the wound bed including Adherent Slough. The periwound skin appearance exhibited: Maceration, Moist. The periwound skin appearance did not exhibit: Callus, Crepitus, Excoriation, Fluctuance, Friable, Induration, Localized Edema, Rash, Scarring, Dry/Scaly, Atrophie Blanche, Cyanosis, Ecchymosis, Hemosiderin Staining, Mottled, Pallor, Rubor, Erythema. Periwound temperature was noted as No Abnormality. The periwound has tenderness on palpation. Wound #2 status is Open. Original cause of wound was Gradually Appeared. The wound is located on the Left,Circumferential Lower Leg. The wound measures 15cm length x 21cm width x 0.1cm depth; 247.4cm^2 area and 24.74cm^3 volume. The wound is limited to skin breakdown. There is no tunneling or undermining noted. There is a large amount of serous drainage noted. The wound margin is indistinct and nonvisible. There is large (67-100%) red, pink granulation within the wound bed. There is a small (1-33%) amount of necrotic tissue within the wound bed including Adherent Slough. The periwound skin appearance exhibited: Localized Edema, Maceration, Moist. The periwound skin appearance did not exhibit: Callus, Crepitus, Excoriation, Fluctuance, Friable, Induration, Rash, Scarring,  Dry/Scaly, Atrophie Blanche, Cyanosis, Ecchymosis, Hemosiderin Staining, Mottled, Pallor, Rubor, Erythema. Periwound temperature was noted as No Abnormality. The periwound has tenderness on palpation. Wound #3 status is Open. Original cause of wound was Gradually Appeared. The wound is located on the [place]. The wound measures 1cm length x 3.5cm width x 0.1cm depth; 2.749cm^2 area and 0.275cm^3 volume. The wound is limited to skin breakdown. There is a medium amount of serous drainage noted. The wound margin is indistinct and nonvisible. There is large (67-100%) pink granulation within the wound bed. There is a small (1-33%) amount of necrotic tissue within  the wound bed including Adherent Slough. The periwound skin appearance exhibited: Maceration, Moist. The periwound skin appearance did not exhibit: Callus, Crepitus, Excoriation, Fluctuance, Friable, Induration, Localized Edema, Rash, Scarring, Dry/Scaly, Atrophie Blanche, Cyanosis, Ecchymosis, Hemosiderin Staining, Mottled, Pallor, Rubor, Erythema. Periwound temperature was noted as No Abnormality. 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 Wound #2 Left,Circumferential Lower Leg: May shower with protection. Wound #3 Left,Plantar Foot: May shower with protection. Primary Wound Dressing: Wound #1  Right,Circumferential Lower Leg: Aquacel Ag Wound #2 Left,Circumferential Lower Leg: Aquacel Ag Wound #3 Left,Plantar Foot: Aquacel Ag Secondary Dressing: Wound #1 Right,Circumferential Lower Leg: ABD pad Dry Gauze Wound #2 Left,Circumferential Lower Leg: ABD pad Dry Gauze Wound #3 Left,Plantar Foot: ABD pad Dry Gauze Dressing Change Frequency: Wound #1 Right,Circumferential Lower Leg: Three times weekly Wound #2 Left,Circumferential Lower Leg: Three times weekly Wound #3 Left,Plantar Foot: Three times weekly Follow-up Appointments: Wound #1 Right,Circumferential Lower Leg: Return Appointment in 2 weeks. - HHRN to come 3x/week next week Wound #2 Left,Circumferential Lower Leg: Return Appointment in 2 weeks. - HHRN to come 3x/week next week Wound #3 Left,Plantar Foot: [name] [name], [name] [name] ([id number]) Return Appointment in 2 weeks. - HHRN to come 3x/week next week Edema Control: Wound #1 Right,Circumferential Lower Leg: Unna Boots Bilaterally Other: - bring wraps that was shipped to house Wound #2 Left,Circumferential Lower Leg: Unna Boots Bilaterally Other: - bring wraps that was shipped to house Wound #3 Left,Plantar Foot: Unna Boots Bilaterally Other: - bring wraps that was shipped to house Home Health: Wound #1 Right,Circumferential Lower Leg: Continue Home Health Visits - [place] please order Juzo compression wraps for patient. Measurements as follows: L ankle - 27cm, calf - 38cm; R ankle - 28cm, 37cm; heel to knee length 47cm Continue Home Health Visits - [facility] please order Juzo compression wraps for patient. Measurements as follows: L ankle - 27cm, calf - 38cm; R ankle - 28cm, 37cm; heel to knee length 47cm Home Health Nurse may visit PRN to address patient s wound care needs. Home Health Nurse may visit PRN to address patient s wound care needs. FACE TO FACE ENCOUNTER: MEDICARE and MEDICAID PATIENTS: I certify that this patient is under my care and  that I had a face-to-face encounter that meets the physician face-to-face encounter requirements with this patient on this date. The encounter with the patient was in whole or in part for the following MEDICAL CONDITION: (primary reason for [place]) MEDICAL NECESSITY: I certify, that based on my findings, NURSING services are a medically necessary home health service. HOME BOUND STATUS: I certify that my clinical findings support that this patient is homebound (i.e., Due to illness or injury, pt requires aid of supportive devices such as crutches, cane, wheelchairs, walkers, the use of special transportation or the assistance of another person to leave their place of residence. There is a normal inability to leave the home and doing so requires considerable and taxing effort. Other absences are for  medical reasons / religious services and are infrequent or of short duration when for other reasons). FACE TO FACE ENCOUNTER: MEDICARE and MEDICAID PATIENTS: I certify that this patient is under my care and that I had a face-to-face encounter that meets the physician face-to-face encounter requirements with this patient on this date. The encounter with the patient was in whole or in part for the following MEDICAL CONDITION: (primary reason for [place]) MEDICAL NECESSITY: I certify, that based on my findings, NURSING services are a medically necessary home health service. HOME BOUND STATUS: I certify that my clinical findings support that this patient is homebound (i.e., Due to illness or injury, pt requires aid of supportive devices such as crutches, cane, wheelchairs, walkers, the use of special transportation or the assistance of another person to leave their place of residence. There is a normal inability to leave the home and doing so requires considerable and taxing effort. Other absences are for medical reasons / religious services and are infrequent or of short duration when for  other reasons). If current dressing causes regression in wound condition, may D/C ordered dressing product/s and apply Normal Saline Moist Dressing daily until next [place] / Other MD appointment. [place] of regression in wound condition at [phone]. If current dressing causes regression in wound condition, may D/C ordered dressing product/s and apply Normal Saline Moist Dressing daily until next [place] / Other MD appointment. [place] of regression in wound condition at [phone]. Please direct any NON-WOUND related issues/requests for orders to patient's Primary Care Physician Please direct any NON-WOUND related issues/requests for orders to patient's Primary Care Physician Wound #2 Left,Circumferential Lower Leg: [place] please order Juzo compression wraps for patient. Measurements as follows: L ankle - 27cm, calf - 38cm; R ankle - 28cm, 37cm; heel to knee length 47cm Continue [facility] Health Visits - [facility] please order Juzo compression wraps for patient. [name] [name], [name] [name] ([id number]) Measurements as follows: L ankle - 27cm, calf - 38cm; R ankle - 28cm, 37cm; heel to knee length 47cm Home Health Nurse may visit PRN to address patient s wound care needs. Home Health Nurse may visit PRN to address patient s wound care needs. FACE TO FACE ENCOUNTER: MEDICARE and MEDICAID PATIENTS: I certify that this patient is under my care and that I had a face-to-face encounter that meets the physician face-to-face encounter requirements with this patient on this date. The encounter with the patient was in whole or in part for the following MEDICAL CONDITION: (primary reason for [place]) MEDICAL NECESSITY: I certify, that based on my findings, NURSING services are a medically necessary home health service. HOME BOUND STATUS: I certify that my clinical findings support that this patient is  homebound (i.e., Due to illness or injury, pt requires aid of supportive devices such as crutches, cane, wheelchairs, walkers, the use of special transportation or the assistance of another person to leave their place of residence. There is a normal inability to leave the home and doing so requires considerable and taxing effort. Other absences are for medical reasons / religious services and are infrequent or of short duration when for other reasons). FACE TO FACE ENCOUNTER: MEDICARE and MEDICAID PATIENTS: I certify that this patient is under my care and that I had a face-to-face encounter that meets the physician face-to-face encounter requirements with this patient on this date. The encounter with the patient was in whole or  in part for the following MEDICAL CONDITION: (primary reason for Home Healthcare) MEDICAL NECESSITY: I certify, that based on my findings, NURSING services are a medically necessary home health service. HOME BOUND STATUS: I certify that my clinical findings support that this patient is homebound (i.e., Due to illness or injury, pt requires aid of supportive devices such as crutches, cane, wheelchairs, walkers, the use of special transportation or the assistance of another person to leave their place of residence. There is a normal inability to leave the home and doing so requires considerable and taxing effort. Other absences are for medical reasons / religious services and are infrequent or of short duration when for other reasons). If current dressing causes regression in wound condition, may D/C ordered dressing product/s and apply Normal Saline Moist Dressing daily until next [place] / Other MD appointment. [place] of regression in wound condition at [phone]. If current dressing causes regression in wound condition, may D/C ordered dressing product/s and apply Normal Saline Moist Dressing daily until next [place] /  Other MD appointment. [place] of regression in wound condition at [phone]. Please direct any NON-WOUND related issues/requests for orders to patient's Primary Care Physician Please direct any NON-WOUND related issues/requests for orders to patient's Primary Care Physician Wound #3 Left,Plantar Foot: [place] please order Juzo compression wraps for patient. Measurements as follows: L ankle - 27cm, calf - 38cm; R ankle - 28cm, 37cm; heel to knee length 47cm Continue Home Health Visits - [facility] please order Juzo compression wraps for patient. Measurements as follows: L ankle - 27cm, calf - 38cm; R ankle - 28cm, 37cm; heel to knee length 47cm Home Health Nurse may visit PRN to address patient s wound care needs. Home Health Nurse may visit PRN to address patient s wound care needs. FACE TO FACE ENCOUNTER: MEDICARE and MEDICAID PATIENTS: I certify that this patient is under my care and that I had a face-to-face encounter that meets the physician face-to-face encounter requirements with this patient on this date. The encounter with the patient was in whole or in part for the following MEDICAL CONDITION: (primary reason for [place]) MEDICAL NECESSITY: I certify, that based on my findings, NURSING services are a medically necessary home health service. HOME BOUND STATUS: I certify that my clinical findings support that this patient is homebound (i.e., Due to illness or injury, pt requires aid of supportive devices such as crutches, cane, wheelchairs, walkers, the use of special transportation or the assistance of another person to leave their place of residence. There is a normal inability to leave the home and doing so requires considerable and taxing effort. Other absences are for medical reasons / religious services and are infrequent or of short duration when for other reasons). FACE TO FACE ENCOUNTER: MEDICARE and  MEDICAID PATIENTS: I certify that this patient is under my care and that I had a face-to-face encounter that meets the physician face-to-face encounter requirements with this patient on this date. The encounter with the patient was in whole or in part for the [name] [name], [name] [name] ([id number]) following MEDICAL CONDITION: (primary reason for [place]) MEDICAL NECESSITY: I certify, that based on my findings, NURSING services are a medically necessary home health service. HOME BOUND STATUS: I certify that my clinical findings support that this patient is homebound (i.e., Due to illness or injury, pt requires aid of supportive devices such as crutches, cane, wheelchairs, walkers,  the use of special transportation or the assistance of another person to leave their place of residence. There is a normal inability to leave the home and doing so requires considerable and taxing effort. Other absences are for medical reasons / religious services and are infrequent or of short duration when for other reasons). If current dressing causes regression in wound condition, may D/C ordered dressing product/s and apply Normal Saline Moist Dressing daily until next [place] / Other MD appointment. [place] of regression in wound condition at [phone]. If current dressing causes regression in wound condition, may D/C ordered dressing product/s and apply Normal Saline Moist Dressing daily until next [place] / Other MD appointment. [place] of regression in wound condition at [phone]. Please direct any NON-WOUND related issues/requests for orders to patient's Primary Care Physician Please direct any NON-WOUND related issues/requests for orders to patient's Primary Care Physician Due to the patient's dementia he has been quite noncompliant and his hygiene is very poor. Energy his condition is very difficult and sometimes he removes his wraps and is  not regular with his elevation and exercise. He is going to be converted to complex care. I have recommended silver alginate and Unna's boots to be applied and changed twice weekly. I have also recommended elevation and exercise and will order him juzo compression stockings with the Velcro wrap. He will come back and see as an regular basis Electronic Signature(s) Signed: 07/02/2015 4:02:39 PM By: Christin Fudge MD, FACS Previous Signature: 07/02/2015 1:20:26 PM Version By: Christin Fudge MD, FACS Entered By: Christin Fudge on 07/02/2015 16:02:39 [name] [name] ([id number]) -------------------------------------------------------------------------------- SuperBill Details Patient Name: [name] [name] Date of Service: 07/02/2015 Medical Record Number: [id number] Patient Account Number: [id number] Date of Birth/Sex: [birth year] (82 y.o. 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 I70.248 Atherosclerosis of native arteries of left leg with ulceration of other part of lower left leg Chronic venous hypertension (idiopathic) with ulcer and inflammation of right lower I87.331 extremity Chronic venous hypertension (idiopathic) with ulcer and inflammation of left lower I87.332 extremity Facility Procedures CPT4 Code: [id number] Description: (Facility Use Only) [id number] - APPLY UNNA BOOT LT Modifier: Quantity: 1 CPT4 Code: [id number] Description: (Facility Use Only) [id number] - APPLY UNNA BOOT RT Modifier: Quantity: 1 Physician Procedures CPT4: Description Modifier Quantity Code [id number] - WC PHYS LEVEL 3 - EST PT  1 ICD-10 Description Diagnosis E11.622 Type 2 diabetes mellitus with other skin ulcer I87.331 Chronic venous hypertension (idiopathic) with ulcer and inflammation of right  lower extremity I87.332 Chronic venous hypertension (idiopathic) with ulcer and inflammation of left lower extremity I89.0 Lymphedema, not elsewhere classified Electronic Signature(s) Signed: 07/02/2015 3:39:32 PM By: Ardean Larsen Signed: 07/02/2015 3:57:47 PM By: Christin Fudge MD, FACS Previous Signature: 07/02/2015 1:20:54 PM Version By: Christin Fudge MD, FACS [name] [name] ([id number]) Entered By: Rebecca Eaton RN, Roslynn Amble on 07/02/2015 13:29:56

## 2015-07-03 ENCOUNTER — Ambulatory Visit (HOSPITAL_BASED_OUTPATIENT_CLINIC_OR_DEPARTMENT_OTHER): Payer: Medicare Other

## 2015-07-09 ENCOUNTER — Encounter: Payer: Medicare Other | Admitting: Surgery

## 2015-07-09 DIAGNOSIS — E11622 Type 2 diabetes mellitus with other skin ulcer: Secondary | ICD-10-CM | POA: Diagnosis not present

## 2015-07-09 NOTE — Progress Notes (Addendum)
JAYTON, RAUTH ([id number]) Visit Report for 07/09/2015 Chief Complaint Document Details Patient Name: [name] [name], [name] [name] Date of Service: 07/09/2015 12:45 PM Medical Record Number: [id number] Patient Account Number: [id number] Date of Birth/Sex: 1927/02/06 (82 y.o. Male) Treating RN: Macarthur Critchley Primary Care Physician: Alvester Chou Other Clinician: Referring Physician: Alvester Chou Treating Physician/Extender: Frann Rider in Treatment: 13 Information Obtained from: Patient Chief Complaint Patient presents to the wound care center for a consult due non healing wound both lower extremities. The patient is a poor historian and was seen here once in July 2016. He says home health was doing his dressings and he was healed but now has recurrence of the problem. Problem has been there at least for 6 months. Electronic Signature(s) Signed: 07/09/2015 1:26:02 PM By: Christin Fudge MD, FACS Entered By: Christin Fudge on 07/09/2015 13:26:02 [name] [name] ([id number]) -------------------------------------------------------------------------------- HPI Details Patient Name: [name] [name] Date of Service: 07/09/2015 12:45 PM Medical Record Number: [id number] Patient Account Number: [id number] Date of Birth/Sex: April 15, 1927 (82 y.o. Male) Treating RN: Macarthur Critchley Primary Care Physician: Alvester Chou Other Clinician: Referring Physician: Alvester Chou Treating Physician/Extender: Frann Rider in Treatment: 13 History of Present Illness Location: swelling of the legs right more than left an ulceration both lower extremities Quality: Patient reports experiencing a dull pain to affected area(s). Severity: Patient states wound (s) are getting worse Duration: Patient has had the wound for > 6 months prior to seeking treatment at the wound center Timing: Pain in wound is Intermittent. Context: The wound appeared gradually over time Modifying Factors: he has been having home health come and apply his  dressing daily was healed. Associated Signs and Symptoms: Patient reports having difficulty standing for long periods. HPI Description: [name] [name] is a 82 y.o. male with history of DM, hypertension, chronic kidney disease, gout, diastolic dysfunction and the patient was having increasing swelling with increasing fluid draining from the legs and pain over the last 6 weeks. He says his wounds are completely healed and the home health had stopped coming to do his dressings. Recent hemoglobin A1c was 6.6. During the last admission A venous duplex study revealed no evidence of deep vein thrombosis or superficial thrombosis involving the right and left lower extremity. Was seen in January 2016 by Dr. Adele Barthel at [facility]: Non-Invasive Vascular Imaging ABI (05/05/2014) o R: 0.65 (0.73), DP: mono, PT: mono, TBI: 0.57 o L: 0.65 (0.69), DP: mono, PT: mono, TBI: 0.36 The patient had the diagnosis of bilateral lower extremity moderate peripheral atherosclerotic disease, bilateral lower extremity chronic venous insufficiency. Due to the patient's age and mental status conservative therapy was only discussed and no surgical intervention was planned. The patient has significant dementia and has a caregiver who spends a few hours every day coming to see him. He does not have any family members. 05/21/2015 -- lower extremity arterial studies done on 05/17/2015 show the right ABI is 0.50-0.79 indicating moderate arterial occlusive disease at rest. The left ABI was 0.5-0.79 indicating moderate arterial occlusive disease at rest. The left posterior tibial and peroneal arteries were not evaluated due to local wound condition. The right TBI was less than 0.69 and the left ABI was less than 0.69 which are both abnormal. Since it's been a year since the previous opinion from Dr. Bridgett Larsson I have asked him to see him for a review. 05/28/2015 -- was seen by Dr. Bridgett Larsson and he recommended that he was not a good candidate for  surgery and surgical revascularization if the need develops. He  recommends to continue with compressive therapy and wound care. 06/04/2015 -- had asked him to remove his compression wraps have a shower and come to see as but he has probably been scratching his legs and caused a lot of excoriation and has multiple open ulcers which [name] [name], [name] [name] ([id number]) are much more worse than before. The patient is not very compliant and may be this is because of his dementia. Electronic Signature(s) Signed: 07/09/2015 1:26:11 PM By: Christin Fudge MD, FACS Entered By: Christin Fudge on 07/09/2015 13:26:11 [name] [name] ([id number]) -------------------------------------------------------------------------------- Physical Exam Details Patient Name: [name] [name] Date of Service: 07/09/2015 12:45 PM Medical Record Number: [id number] Patient Account Number: [id number] Date of Birth/Sex: [birth year] (82 y.o. Male) Treating RN: Macarthur Critchley Primary Care Physician: Alvester Chou Other Clinician: Referring Physician: Alvester Chou Treating Physician/Extender: Frann Rider in Treatment: 13 Constitutional . Pulse regular. Respirations normal and unlabored. Afebrile. . Eyes Nonicteric. Reactive to light. Ears, Nose, Mouth, and Throat Lips, teeth, and gums WNL.Marland Kitchen Moist mucosa without lesions. Neck supple and nontender. No palpable supraclavicular or cervical adenopathy. Normal sized without goiter. Respiratory WNL. No retractions.. Cardiovascular Pedal Pulses WNL. No clubbing, cyanosis or edema. Lymphatic No adneopathy. No adenopathy. No adenopathy. Musculoskeletal Adexa without tenderness or enlargement.. Digits and nails w/o clubbing, cyanosis, infection, petechiae, ischemia, or inflammatory conditions.. Integumentary (Hair, Skin) No suspicious lesions. No crepitus or fluctuance. No peri-wound warmth or erythema. No masses.Marland Kitchen Psychiatric Judgement and insight Intact.. No evidence of depression,  anxiety, or agitation.. Notes the left lower extremity continues to have a few more open areas in the dorsum of the foot and the large lateral lower extremity ulceration persists. The right side looks a little better and the edema is well controlled. 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 Male) Treating RN: Macarthur Critchley Primary Care Physician: Alvester Chou Other Clinician: Referring Physician: Alvester Chou Treating Physician/Extender: Frann Rider in Treatment: 13 Verbal / Phone Orders: Yes Clinician: Macarthur Critchley Read Back and Verified: Yes Diagnosis Coding Wound Cleansing Wound #1 Right,Circumferential Lower Leg o May shower with protection. Wound #2 Left,Circumferential Lower Leg o May shower with protection. Wound #3 Left,Plantar Foot o May shower with protection. Wound #5 Left,Dorsal Foot o May shower with protection. Primary Wound Dressing Wound #1 Right,Circumferential Lower Leg o Aquacel Ag Wound #2 Left,Circumferential Lower Leg o Aquacel Ag Wound #3 Left,Plantar Foot o Aquacel Ag Wound #5 Left,Dorsal Foot o Aquacel Ag Secondary Dressing Wound #1 Right,Circumferential Lower Leg o ABD pad o Dry Gauze o Drawtex Wound #2 Left,Circumferential Lower Leg o ABD pad o Dry Gauze o Drawtex [name] [name], [name] [name] ([id number]) Wound #3 Left,Plantar Foot o ABD pad o Dry Gauze o Drawtex Wound #5 Left,Dorsal Foot o ABD pad o Dry Gauze o Drawtex Dressing Change Frequency Wound #1 Right,Circumferential  Lower Leg o Three times weekly Wound #2 Left,Circumferential Lower Leg o Three times weekly Wound #3 Left,Plantar Foot o Three times weekly Wound #5 Left,Dorsal Foot o Three times weekly Follow-up Appointments Wound #1 Right,Circumferential Lower Leg o Return Appointment in 2 weeks. - HHRN to come 3x/week next week Wound #2 Left,Circumferential Lower Leg o Return Appointment in 2 weeks. - HHRN to come 3x/week next week Wound #3 Left,Plantar Foot o Return  Appointment in 2 weeks. - HHRN to come 3x/week next week Wound #5 Left,Dorsal Foot o Return Appointment in 2 weeks. - HHRN to come 3x/week next week Edema Control Wound #1 Right,Circumferential Lower Leg o Unna Boots Bilaterally o Other: - bring wraps that was shipped to house Wound #2 Left,Circumferential Lower Leg o Unna Boots Bilaterally o Other: - bring wraps that was shipped to house Wound #3 Left,Plantar Foot [address] YERACHMIEL, PRIZZI ([id number]) o Other: - bring wraps that was shipped to house Wound #5 Left,Dorsal Foot o Unna Boots Bilaterally o Other: - bring wraps that was shipped to [place] #1 [place] Visits - Please see Mr Motton 3x next week. Return to clinic in two weeks (March 27) o [place] Nurse may visit PRN to address patientos wound care needs. o Home Health Nurse may visit PRN to address patientos wound care needs. o FACE TO FACE ENCOUNTER: MEDICARE and MEDICAID PATIENTS: I certify that this patient is under my care and that I had a face-to-face encounter that meets the physician face-to-face encounter requirements with this patient on this date. The encounter with the patient was in whole or in part for the following MEDICAL CONDITION: (primary reason for [place]) MEDICAL NECESSITY: I certify, that based on my findings, NURSING services are a  medically necessary home health service. HOME BOUND STATUS: I certify that my clinical findings support that this patient is homebound (i.e., Due to illness or injury, pt requires aid of supportive devices such as crutches, cane, wheelchairs, walkers, the use of special transportation or the assistance of another person to leave their place of residence. There is a normal inability to leave the home and doing so requires considerable and taxing effort. Other absences are for medical reasons / religious services and are infrequent or of short duration when for other reasons). o FACE TO FACE ENCOUNTER: MEDICARE and MEDICAID PATIENTS: I certify that this patient is under my care and that I had a face-to-face encounter that meets the physician face-to-face encounter requirements with this patient on this date. The encounter with the patient was in whole or in part for the following MEDICAL CONDITION: (primary reason for [place]) MEDICAL NECESSITY: I certify, that based on my findings, NURSING services are a medically necessary home health service. HOME BOUND STATUS: I certify that my clinical findings support that this patient is homebound (i.e., Due to illness or injury, pt requires aid of supportive devices such as crutches, cane, wheelchairs, walkers, the use of special transportation or the assistance of another person to leave their place of residence. There is a normal inability to leave the home and doing so requires considerable and taxing effort. Other absences are for medical reasons / religious services and are infrequent or of short duration when for other reasons). o If current dressing causes regression in wound condition, may D/C ordered dressing product/s and apply Normal Saline Moist Dressing daily until next [place] / Other MD appointment. [place] of regression in wound condition at [phone]. o If current dressing causes  regression in wound condition, may D/C ordered dressing product/s and apply Normal Saline Moist Dressing daily until next [place] / Other MD appointment. [place] of regression in wound condition at [phone]. o Please direct any NON-WOUND related issues/requests for orders to patient's Primary Care Physician o Please direct any NON-WOUND related issues/requests for  orders to patient's Primary Care Physician Wound #2 Left,Circumferential Lower Leg [name] [name], [name] [name] ([id number]) o [place] Visits - Please see Mr Ramsier 3x next week. Return to clinic in two weeks (March 27) o [place] Nurse may visit PRN to address patientos wound care needs. o Home Health Nurse may visit PRN to address patientos wound care needs. o FACE TO FACE ENCOUNTER: MEDICARE and MEDICAID PATIENTS: I certify that this patient is under my care and that I had a face-to-face encounter that meets the physician face-to-face encounter requirements with this patient on this date. The encounter with the patient was in whole or in part for the following MEDICAL CONDITION: (primary reason for [place]) MEDICAL NECESSITY: I certify, that based on my findings, NURSING services are a medically necessary home health service. HOME BOUND STATUS: I certify that my clinical findings support that this patient is homebound (i.e., Due to illness or injury, pt requires aid of supportive devices such as crutches, cane, wheelchairs, walkers, the use of special transportation or the assistance of another person to leave their place of residence. There is a normal inability to leave the home and doing so requires considerable and taxing effort. Other absences are for medical reasons / religious services and are infrequent or of short duration when for other reasons). o FACE TO FACE ENCOUNTER: MEDICARE and MEDICAID PATIENTS: I certify that this patient  is under my care and that I had a face-to-face encounter that meets the physician face-to-face encounter requirements with this patient on this date. The encounter with the patient was in whole or in part for the following MEDICAL CONDITION: (primary reason for [place]) MEDICAL NECESSITY: I certify, that based on my findings, NURSING services are a medically necessary home health service. HOME BOUND STATUS: I certify that my clinical findings support that this patient is homebound (i.e., Due to illness or injury, pt requires aid of supportive devices such as crutches, cane, wheelchairs, walkers, the use of special transportation or the assistance of another person to leave their place of residence. There is a normal inability to leave the home and doing so requires considerable and taxing effort. Other absences are for medical reasons / religious services and are infrequent or of short duration when for other reasons). o If current dressing causes regression in wound condition, may D/C ordered dressing product/s and apply Normal Saline Moist Dressing daily until next [place] / Other MD appointment. [place] of regression in wound condition at [phone]. o If current dressing causes regression in wound condition, may D/C ordered dressing product/s and apply Normal Saline Moist Dressing daily until next [place] / Other MD appointment. [place] of regression in wound condition at [phone]. o Please direct any NON-WOUND related issues/requests for orders to patient's Primary Care Physician o Please direct any NON-WOUND related issues/requests for orders to patient's Primary Care Physician Wound #3 [place] Visits - Please see Mr Everheart 3x next week. Return to clinic in two weeks (March 27) o [place] Nurse may visit PRN to address patientos  wound care needs. o Home Health Nurse may visit PRN to address patientos wound care needs. o FACE TO FACE ENCOUNTER: MEDICARE and MEDICAID PATIENTS: I certify that this patient is under my care and that I had a face-to-face encounter that meets the physician face-to-face encounter requirements with this patient on this date.  The encounter with the patient was in whole or in part for the following MEDICAL CONDITION: (primary reason for [place]) [name] [name], [name] [name] ([id number]) MEDICAL NECESSITY: I certify, that based on my findings, NURSING services are a medically necessary home health service. HOME BOUND STATUS: I certify that my clinical findings support that this patient is homebound (i.e., Due to illness or injury, pt requires aid of supportive devices such as crutches, cane, wheelchairs, walkers, the use of special transportation or the assistance of another person to leave their place of residence. There is a normal inability to leave the home and doing so requires considerable and taxing effort. Other absences are for medical reasons / religious services and are infrequent or of short duration when for other reasons). o FACE TO FACE ENCOUNTER: MEDICARE and MEDICAID PATIENTS: I certify that this patient is under my care and that I had a face-to-face encounter that meets the physician face-to-face encounter requirements with this patient on this date. The encounter with the patient was in whole or in part for the following MEDICAL CONDITION: (primary reason for [place]) MEDICAL NECESSITY: I certify, that based on my findings, NURSING services are a medically necessary home health service. HOME BOUND STATUS: I certify that my clinical findings support that this patient is homebound (i.e., Due to illness or injury, pt requires aid of supportive devices such as crutches, cane, wheelchairs, walkers, the use of special transportation or the assistance of another person to leave  their place of residence. There is a normal inability to leave the home and doing so requires considerable and taxing effort. Other absences are for medical reasons / religious services and are infrequent or of short duration when for other reasons). o If current dressing causes regression in wound condition, may D/C ordered dressing product/s and apply Normal Saline Moist Dressing daily until next [place] / Other MD appointment. [place] of regression in wound condition at [phone]. o If current dressing causes regression in wound condition, may D/C ordered dressing product/s and apply Normal Saline Moist Dressing daily until next [place] / Other MD appointment. [place] of regression in wound condition at [phone]. o Please direct any NON-WOUND related issues/requests for orders to patient's Primary Care Physician o Please direct any NON-WOUND related issues/requests for orders to patient's Primary Care Physician Wound #5 [place] Visits - Please see Mr Wojnarowski 3x next week. Return to clinic in two weeks (March 27) o [place] Nurse may visit PRN to address patientos wound care needs. o Home Health Nurse may visit PRN to address patientos wound care needs. o FACE TO FACE ENCOUNTER: MEDICARE and MEDICAID PATIENTS: I certify that this patient is under my care and that I had a face-to-face encounter that meets the physician face-to-face encounter requirements with this patient on this date. The encounter with the patient was in whole or in part for the following MEDICAL CONDITION: (primary reason for [place]) MEDICAL NECESSITY: I certify, that based on my findings, NURSING services are a medically necessary home health service. HOME BOUND STATUS: I certify that my clinical findings support that this patient is homebound (i.e., Due to  illness or injury, pt requires aid of supportive devices such as crutches, cane, wheelchairs, walkers, the use of special transportation or the assistance of another person to leave their place of residence. There is a normal inability to leave the home and doing so  requires considerable and taxing effort. Other absences are for medical reasons / religious services and are infrequent or of short duration when for other reasons). [name] [name], [name] [name] ([id number]) o FACE TO FACE ENCOUNTER: MEDICARE and MEDICAID PATIENTS: I certify that this patient is under my care and that I had a face-to-face encounter that meets the physician face-to-face encounter requirements with this patient on this date. The encounter with the patient was in whole or in part for the following MEDICAL CONDITION: (primary reason for [place]) MEDICAL NECESSITY: I certify, that based on my findings, NURSING services are a medically necessary home health service. HOME BOUND STATUS: I certify that my clinical findings support that this patient is homebound (i.e., Due to illness or injury, pt requires aid of supportive devices such as crutches, cane, wheelchairs, walkers, the use of special transportation or the assistance of another person to leave their place of residence. There is a normal inability to leave the home and doing so requires considerable and taxing effort. Other absences are for medical reasons / religious services and are infrequent or of short duration when for other reasons). o If current dressing causes regression in wound condition, may D/C ordered dressing product/s and apply Normal Saline Moist Dressing daily until next [place] / Other MD appointment. [place] of regression in wound condition at [phone]. o If current dressing causes regression in wound condition, may D/C ordered dressing product/s and apply Normal Saline Moist Dressing daily until next [place] / Other MD appointment. [place] of regression in wound condition at [phone]. o Please direct any NON-WOUND related issues/requests for orders to patient's Primary Care Physician o Please direct any NON-WOUND related issues/requests for orders to patient's Primary Care Physician Electronic Signature(s) Signed: 07/09/2015 2:46:25 PM By: Ardean Larsen Signed: 07/09/2015 4:11:43 PM By: Christin Fudge MD, FACS Entered By: Rebecca Eaton RN, [place] on 07/09/2015 13:17:47 [name] [name] ([id number]) -------------------------------------------------------------------------------- Problem List Details Patient Name: [name] [name] Date of Service: 07/09/2015 12:45 PM Medical Record Number: [id number] Patient Account Number: [id number] Date of Birth/Sex: Mar 20, 1927 (82 y.o. 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 04/09/2015 Yes ulceration of other part of lower right leg I70.248 Atherosclerosis of native arteries of left leg with ulceration 04/09/2015 Yes of other part of lower left leg I87.331 Chronic venous hypertension (idiopathic) with ulcer and 04/09/2015 Yes inflammation of right lower extremity I87.332 Chronic venous hypertension (idiopathic) with ulcer and 04/09/2015 Yes inflammation of left lower extremity Inactive Problems Resolved Problems Electronic Signature(s) Signed: 07/09/2015 1:25:55 PM By: Christin Fudge MD, FACS Entered By: Christin Fudge on 07/09/2015 13:25:55 [name] [name]  ([id number]) -------------------------------------------------------------------------------- Progress Note Details Patient Name: [name] [name] Date of Service: 07/09/2015 12:45 PM Medical Record Number: [id number] Patient Account Number: [id number] Date of Birth/Sex: May 13, 1926 (82 y.o. Male) Treating RN: Macarthur Critchley Primary Care Physician: Alvester Chou Other Clinician: Referring Physician: Alvester Chou Treating Physician/Extender: Frann Rider in Treatment: 13 Subjective Chief Complaint Information obtained from Patient Patient presents to the wound care center for a consult due non healing wound both lower extremities. The patient is a poor historian and was seen here once in July 2016. He says home health  was doing his dressings and he was healed but now has recurrence of the problem. Problem has been there at least for 6 months. History of Present Illness (HPI) The following HPI elements were documented for the patient's wound: Location: swelling of the legs right more than left an ulceration both lower extremities Quality: Patient reports experiencing a dull pain to affected area(s). Severity: Patient states wound (s) are getting worse Duration: Patient has had the wound for > 6 months prior to seeking treatment at the wound center Timing: Pain in wound is Intermittent. Context: The wound appeared gradually over time Modifying Factors: he has been having home health come and apply his dressing daily was healed. Associated Signs and Symptoms: Patient reports having difficulty standing for long periods. Clay Scipio is a 82 y.o. male with history of DM, hypertension, chronic kidney disease, gout, diastolic dysfunction and the patient was having increasing swelling with increasing fluid draining from the legs and pain over the last 6 weeks. He says his wounds are completely healed and the home health had stopped coming to do his dressings. Recent hemoglobin A1c was  6.6. During the last admission A venous duplex study revealed no evidence of deep vein thrombosis or superficial thrombosis involving the right and left lower extremity. Was seen in January 2016 by Dr. Adele Barthel at [facility]: Non-Invasive Vascular Imaging ABI (05/05/2014)  R: 0.65 (0.73), DP: mono, PT: mono, TBI: 0.57  L: 0.65 (0.69), DP: mono, PT: mono, TBI: 0.36 The patient had the diagnosis of bilateral lower extremity moderate peripheral atherosclerotic disease, bilateral lower extremity chronic venous insufficiency. Due to the patient's age and mental status conservative therapy was only discussed and no surgical intervention was planned. The patient has significant dementia and has a caregiver who spends a few hours every day coming to see him. He does not have any family members. 05/21/2015 -- lower extremity arterial studies done on 05/17/2015 show the right ABI is 0.50-0.79 indicating moderate arterial occlusive disease at rest. The left ABI was 0.5-0.79 indicating moderate arterial occlusive disease at rest. The left posterior tibial and peroneal arteries were not evaluated due to local wound [name] [name], [name] [name] ([id number]) condition. The right TBI was less than 0.69 and the left ABI was less than 0.69 which are both abnormal. Since it's been a year since the previous opinion from Dr. Bridgett Larsson I have asked him to see him for a review. 05/28/2015 -- was seen by Dr. Bridgett Larsson and he recommended that he was not a good candidate for surgery and surgical revascularization if the need develops. He recommends to continue with compressive therapy and wound care. 06/04/2015 -- had asked him to remove his compression wraps have a shower and come to see as but he has probably been scratching his legs and caused a lot of excoriation and has multiple open ulcers which are much more worse than before. The patient is not very compliant and may be this is because of his dementia. Objective Constitutional Pulse  regular. Respirations normal and unlabored. Afebrile. Vitals Time Taken: 12:45 PM, Height: 70 in, Weight: 230 lbs, BMI: 33, Pulse: 68 bpm, Blood Pressure: 109/54 mmHg. Eyes Nonicteric. Reactive to light. Ears, Nose, Mouth, and Throat Lips, teeth, and gums WNL.Marland Kitchen Moist mucosa without lesions. Neck supple and nontender. No palpable supraclavicular or cervical adenopathy. Normal sized without goiter. Respiratory WNL. No retractions.. Cardiovascular Pedal Pulses WNL. No clubbing, cyanosis or edema. Lymphatic No adneopathy. No adenopathy. No adenopathy. Musculoskeletal Adexa without tenderness or enlargement.. Digits and nails w/o  clubbing, cyanosis, infection, petechiae, ischemia, or inflammatory conditions.Marland Kitchen Psychiatric [name] [name], [name] [name] ([id number]) Judgement and insight Intact.. No evidence of depression, anxiety, or agitation.. General Notes: the left lower extremity continues to have a few more open areas in the dorsum of the foot and the large lateral lower extremity ulceration persists. The right side looks a little better and the edema is well controlled. As before his hygiene is rather poor Integumentary (Hair, Skin) No suspicious lesions. No crepitus or fluctuance. No peri-wound warmth or erythema. No masses.. Wound #1 status is Open. Original cause of wound was Gradually Appeared. The wound is located on the Right,Circumferential Lower Leg. The wound measures 5cm length x 6cm width x 0.1cm depth; 23.562cm^2 area and 2.356cm^3 volume. The wound is limited to skin breakdown. There is no tunneling or undermining noted. There is a large amount of serous drainage noted. The wound margin is indistinct and nonvisible. There is large (67-100%) pink, pale granulation within the wound bed. There is a small (1-33%) amount of necrotic tissue within the wound bed including Adherent Slough. The periwound skin appearance exhibited: Maceration, Moist. The periwound skin appearance did not exhibit:  Callus, Crepitus, Excoriation, Fluctuance, Friable, Induration, Localized Edema, Rash, Scarring, Dry/Scaly, Atrophie Blanche, Cyanosis, Ecchymosis, Hemosiderin Staining, Mottled, Pallor, Rubor, Erythema. Periwound temperature was noted as No Abnormality. The periwound has tenderness on palpation. Wound #2 status is Open. Original cause of wound was Gradually Appeared. The wound is located on the Left,Circumferential Lower Leg. The wound measures 14cm length x 22cm width x 0.1cm depth; 241.903cm^2 area and 24.19cm^3 volume. The wound is limited to skin breakdown. There is no tunneling or undermining noted. There is a large amount of serous drainage noted. The wound margin is indistinct and nonvisible. There is large (67-100%) red, pink granulation within the wound bed. There is a small (1-33%) amount of necrotic tissue within the wound bed including Adherent Slough. The periwound skin appearance exhibited: Localized Edema, Maceration, Moist. The periwound skin appearance did not exhibit: Callus, Crepitus, Excoriation, Fluctuance, Friable, Induration, Rash, Scarring, Dry/Scaly, Atrophie Blanche, Cyanosis, Ecchymosis, Hemosiderin Staining, Mottled, Pallor, Rubor, Erythema. Periwound temperature was noted as No Abnormality. The periwound has tenderness on palpation. Wound #3 status is Open. Original cause of wound was Gradually Appeared. The wound is located on the [place]. The wound measures 1cm length x 3cm width x 0.1cm depth; 2.356cm^2 area and 0.236cm^3 volume. The wound is limited to skin breakdown. There is no tunneling or undermining noted. There is a medium amount of serous drainage noted. The wound margin is indistinct and nonvisible. There is large (67-100%) pink granulation within the wound bed. There is a small (1-33%) amount of necrotic tissue within the wound bed including Adherent Slough. The periwound skin appearance exhibited: Maceration, Moist. The periwound skin  appearance did not exhibit: Callus, Crepitus, Excoriation, Fluctuance, Friable, Induration, Localized Edema, Rash, Scarring, Dry/Scaly, Atrophie Blanche, Cyanosis, Ecchymosis, Hemosiderin Staining, Mottled, Pallor, Rubor, Erythema. Periwound temperature was noted as No Abnormality. Wound #5 status is Open. Original cause of wound was Gradually Appeared. The wound is located on the Left,Dorsal Foot. The wound measures 10.5cm length x 7cm width x 1cm depth; 57.727cm^2 area and 57.727cm^3 volume. The wound is limited to skin breakdown. There is no tunneling or undermining noted. There is a large amount of serous drainage noted. The wound margin is flat and intact. There is large (67- 100%) red, pink granulation within the wound bed. There is a small (1-33%) amount of necrotic tissue within the wound bed including  Adherent Slough. The periwound skin appearance exhibited: Maceration, Moist. The periwound skin appearance did not exhibit: Callus, Crepitus, Excoriation, Fluctuance, Friable, Induration, Localized Edema, Rash, Scarring, Dry/Scaly, Atrophie Blanche, Cyanosis, Ecchymosis, [place], [place] ([id number]) Hemosiderin Staining, Mottled, Pallor, Rubor, Erythema. 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 Wound #2 Left,Circumferential Lower Leg: May shower with protection. Wound #3 Left,Plantar  Foot: May shower with protection. Wound #5 Left,Dorsal Foot: May shower with protection. Primary Wound Dressing: Wound #1 Right,Circumferential Lower Leg: Aquacel Ag Wound #2 Left,Circumferential Lower Leg: Aquacel Ag Wound #3 Left,Plantar Foot: Aquacel Ag Wound #5 Left,Dorsal Foot: Aquacel Ag Secondary Dressing: Wound #1 Right,Circumferential Lower Leg: ABD pad Dry Gauze Drawtex Wound #2 Left,Circumferential Lower Leg: ABD pad Dry Gauze [place], [place] ([id number]) Drawtex Wound #3 Left,Plantar Foot: ABD pad Dry Gauze Drawtex Wound #5 Left,Dorsal Foot: ABD pad Dry Gauze Drawtex Dressing Change Frequency: Wound #1 Right,Circumferential Lower Leg: Three times weekly Wound #2 Left,Circumferential Lower Leg: Three times weekly Wound #3 Left,Plantar Foot: Three times weekly Wound #5 Left,Dorsal Foot: Three times weekly Follow-up Appointments: Wound #1 Right,Circumferential Lower Leg: Return Appointment in 2 weeks. - HHRN to come 3x/week next week Wound #2 Left,Circumferential Lower Leg: Return Appointment in 2 weeks. - HHRN to come 3x/week next week Wound #3 Left,Plantar Foot: Return Appointment in 2 weeks. - HHRN to come 3x/week next week Wound #5 Left,Dorsal Foot: Return Appointment in 2 weeks. - HHRN to come 3x/week next week Edema Control: Wound #1 Right,Circumferential Lower Leg: Unna Boots Bilaterally Other: - bring wraps that was shipped to house Wound #2 Left,Circumferential Lower Leg: Unna Boots Bilaterally Other: - bring wraps that was shipped to house Wound #3 Left,Plantar Foot: Unna Boots Bilaterally Other: - bring wraps that was shipped to house Wound #5 Left,Dorsal Foot: Unna Boots Bilaterally Other: - bring wraps that was shipped to house Home Health: Wound #1 Right,Circumferential Lower Leg: Continue Home Health Visits - Please see Mr Caranci 3x next week. Return to clinic in two weeks (March 27) [place] Nurse may  visit PRN to address patient s wound care needs. Home Health Nurse may visit PRN to address patient s wound care needs. FACE TO FACE ENCOUNTER: MEDICARE and MEDICAID PATIENTS: I certify that this patient is under my care and that I had a face-to-face encounter that meets the physician face-to-face encounter requirements with this patient on this date. The encounter with the patient was in whole or in part for the following MEDICAL CONDITION: (primary reason for [place]) MEDICAL NECESSITY: I certify, that based on my findings, NURSING services are a medically necessary home health service. HOME Blowers, FAVOR ([id number]) BOUND STATUS: I certify that my clinical findings support that this patient is homebound (i.e., Due to illness or injury, pt requires aid of supportive devices such as crutches, cane, wheelchairs, walkers, the use of special transportation or the assistance of another person to leave their place of residence. There is a normal inability to leave the home and  doing so requires considerable and taxing effort. Other absences are for medical reasons / religious services and are infrequent or of short duration when for other reasons). FACE TO FACE ENCOUNTER: MEDICARE and MEDICAID PATIENTS: I certify that this patient is under my care and that I had a face-to-face encounter that meets the physician face-to-face encounter requirements with this patient on this date. The encounter with the patient was in whole or in part for the following MEDICAL CONDITION: (primary reason for [place]) MEDICAL NECESSITY: I certify, that based on my findings, NURSING services are a medically necessary home health service. HOME BOUND STATUS: I certify that my clinical findings support that this patient is homebound (i.e., Due to illness or injury, pt requires aid of supportive devices such as crutches, cane, wheelchairs, walkers, the use of special transportation or the assistance of another  person to leave their place of residence. There is a normal inability to leave the home and doing so requires considerable and taxing effort. Other absences are for medical reasons / religious services and are infrequent or of short duration when for other reasons). If current dressing causes regression in wound condition, may D/C ordered dressing product/s and apply Normal Saline Moist Dressing daily until next [place] / Other MD appointment. [place] of regression in wound condition at [phone]. If current dressing causes regression in wound condition, may D/C ordered dressing product/s and apply Normal Saline Moist Dressing daily until next [place] / Other MD appointment. [place] of regression in wound condition at [phone]. Please direct any NON-WOUND related issues/requests for orders to patient's Primary Care Physician Please direct any NON-WOUND related issues/requests for orders to patient's Primary Care Physician Wound #2 Left,Circumferential Lower Leg: [place] Visits - Please see Mr Brenna 3x next week. Return to clinic in two weeks (March 27) [place] Nurse may visit PRN to address patient s wound care needs. Home Health Nurse may visit PRN to address patient s wound care needs. FACE TO FACE ENCOUNTER: MEDICARE and MEDICAID PATIENTS: I certify that this patient is under my care and that I had a face-to-face encounter that meets the physician face-to-face encounter requirements with this patient on this date. The encounter with the patient was in whole or in part for the following MEDICAL CONDITION: (primary reason for [place]) MEDICAL NECESSITY: I certify, that based on my findings, NURSING services are a medically necessary home health service. HOME BOUND STATUS: I certify that my clinical findings support that this patient is homebound (i.e., Due to illness or  injury, pt requires aid of supportive devices such as crutches, cane, wheelchairs, walkers, the use of special transportation or the assistance of another person to leave their place of residence. There is a normal inability to leave the home and doing so requires considerable and taxing effort. Other absences are for medical reasons / religious services and are infrequent or of short duration when for other reasons). FACE TO FACE ENCOUNTER: MEDICARE and MEDICAID PATIENTS: I certify that this patient is under my care and that I had a face-to-face encounter that meets the physician face-to-face encounter requirements with this patient on this date. The encounter with the patient was in whole or in part for the following MEDICAL CONDITION: (primary reason for [place]) MEDICAL NECESSITY: I certify, that based on my findings, NURSING services are a medically necessary home health service. HOME BOUND STATUS: I certify that my clinical  findings support that this patient is homebound (i.e., Due to illness or injury, pt requires aid of supportive devices such as crutches, cane, wheelchairs, walkers, the use of special transportation or the assistance of another person to leave their place of residence. There is a normal inability to leave the home and doing so requires considerable and taxing effort. Other absences are for medical reasons / religious services and are infrequent or of short duration when for other reasons). If current dressing causes regression in wound condition, may D/C ordered dressing product/s and apply Normal Saline Moist Dressing daily until next [place] / Other MD appointment. [place] of regression in wound condition at [phone]. [name] [name], UMPIERRE ([id number]) If current dressing causes regression in wound condition, may D/C ordered dressing product/s and apply Normal Saline Moist Dressing daily until next [place] / Other MD  appointment. [place] of regression in wound condition at [phone]. Please direct any NON-WOUND related issues/requests for orders to patient's Primary Care Physician Please direct any NON-WOUND related issues/requests for orders to patient's Primary Care Physician Wound #3 Left,Plantar Foot: [place] Visits - Please see Mr Bouwkamp 3x next week. Return to clinic in two weeks (March 27) [place] Nurse may visit PRN to address patient s wound care needs. Home Health Nurse may visit PRN to address patient s wound care needs. FACE TO FACE ENCOUNTER: MEDICARE and MEDICAID PATIENTS: I certify that this patient is under my care and that I had a face-to-face encounter that meets the physician face-to-face encounter requirements with this patient on this date. The encounter with the patient was in whole or in part for the following MEDICAL CONDITION: (primary reason for [place]) MEDICAL NECESSITY: I certify, that based on my findings, NURSING services are a medically necessary home health service. HOME BOUND STATUS: I certify that my clinical findings support that this patient is homebound (i.e., Due to illness or injury, pt requires aid of supportive devices such as crutches, cane, wheelchairs, walkers, the use of special transportation or the assistance of another person to leave their place of residence. There is a normal inability to leave the home and doing so requires considerable and taxing effort. Other absences are for medical reasons / religious services and are infrequent or of short duration when for other reasons). FACE TO FACE ENCOUNTER: MEDICARE and MEDICAID PATIENTS: I certify that this patient is under my care and that I had a face-to-face encounter that meets the physician face-to-face encounter requirements with this patient on this date. The encounter with the patient was in whole or in part for the following  MEDICAL CONDITION: (primary reason for [place]) MEDICAL NECESSITY: I certify, that based on my findings, NURSING services are a medically necessary home health service. HOME BOUND STATUS: I certify that my clinical findings support that this patient is homebound (i.e., Due to illness or injury, pt requires aid of supportive devices such as crutches, cane, wheelchairs, walkers, the use of special transportation or the assistance of another person to leave their place of residence. There is a normal inability to leave the home and doing so requires considerable and taxing effort. Other absences are for medical reasons / religious services and are infrequent or of short duration when for other reasons). If current dressing causes regression in wound condition, may D/C ordered dressing product/s and apply Normal Saline Moist Dressing daily until next [place] / Other MD appointment. [facility] of regression in wound condition at [phone]. If current dressing causes regression in wound condition, may D/C ordered dressing product/s and apply Normal Saline Moist Dressing daily until next [place] / Other MD appointment. [place] of regression in wound condition at [phone]. Please direct any NON-WOUND related issues/requests for orders to patient's Primary Care Physician Please direct any NON-WOUND related issues/requests for orders to patient's Primary Care Physician Wound #5 Left,Dorsal Foot: [place] Visits - Please see Mr Myerson 3x next week. Return to clinic in two weeks (March 27) [place] Nurse may visit PRN to address patient s wound care needs. Home Health Nurse may visit PRN to address patient s wound care needs. FACE TO FACE ENCOUNTER: MEDICARE and MEDICAID PATIENTS: I certify that this patient is under my care and that I had a face-to-face encounter that meets the physician  face-to-face encounter requirements with this patient on this date. The encounter with the patient was in whole or in part for the following MEDICAL CONDITION: (primary reason for [place]) MEDICAL NECESSITY: I certify, that based on my findings, NURSING services are a medically necessary home health service. HOME BOUND STATUS: I certify that my clinical findings support that this patient is homebound (i.e., Due to illness or injury, pt requires aid of supportive devices such as crutches, cane, wheelchairs, walkers, the use of special transportation or the assistance of another person to leave their place of residence. There is a [name] [name], [name] [name] ([id number]) normal inability to leave the home and doing so requires considerable and taxing effort. Other absences are for medical reasons / religious services and are infrequent or of short duration when for other reasons). FACE TO FACE ENCOUNTER: MEDICARE and MEDICAID PATIENTS: I certify that this patient is under my care and that I had a face-to-face encounter that meets the physician face-to-face encounter requirements with this patient on this date. The encounter with the patient was in whole or in part for the following MEDICAL CONDITION: (primary reason for [place]) MEDICAL NECESSITY: I certify, that based on my findings, NURSING services are a medically necessary home health service. HOME BOUND STATUS: I certify that my clinical findings support that this patient is homebound (i.e., Due to illness or injury, pt requires aid of supportive devices such as crutches, cane, wheelchairs, walkers, the use of special transportation or the assistance of another person to leave their place of residence. There is a normal inability to leave the home and doing so requires considerable and taxing effort. Other absences are for medical reasons / religious services and are infrequent or of short duration when for other reasons). If current dressing  causes regression in wound condition, may D/C ordered dressing product/s and apply Normal Saline Moist Dressing daily until next [place] / Other MD appointment. [place] of regression in wound condition at [phone]. If current dressing causes regression in wound condition, may D/C ordered dressing product/s and apply Normal Saline Moist Dressing daily until next [place] / Other MD appointment. [place] of regression in wound condition at [phone]. Please direct any NON-WOUND related issues/requests for orders to patient's Primary Care Physician Please direct any NON-WOUND related issues/requests for orders to patient's Primary Care Physician I have recommended silver alginate and Unna's boots to be applied and changed twice weekly. I have also recommended elevation and exercise and will order him juzo compression stockings with the Velcro wrap.  After continuing with conservative therapy I believe he may benefit with lymphedema pumps and we will try and work on this through his insurance when the find that his drainage is becoming a little better He will come back and see as an regular basis Electronic Signature(s) Signed: 07/09/2015 4:17:05 PM By: Christin Fudge MD, FACS Previous Signature: 07/09/2015 1:28:32 PM Version By: Christin Fudge MD, FACS Entered By: Christin Fudge on 07/09/2015 16:17:04 [name] [name] ([id number]) -------------------------------------------------------------------------------- SuperBill Details Patient Name: [name] [name] Date of Service: 07/09/2015 Medical Record Number: [id number] Patient Account Number: [id number] Date of Birth/Sex: [birth year] (82 y.o. Male) Treating RN: Macarthur Critchley Primary Care Physician: Alvester Chou Other Clinician: Referring Physician: Alvester Chou Treating Physician/Extender: Frann Rider in Treatment: 13 Diagnosis Coding ICD-10 Codes Code Description E11.622 Type  2 diabetes mellitus with other skin ulcer I89.0 Lymphedema, not elsewhere classified I70.238 Atherosclerosis of native arteries of right leg with ulceration of other part of lower right leg I70.248 Atherosclerosis of native arteries of left leg with ulceration of other part of lower left leg Chronic venous hypertension (idiopathic) with ulcer and inflammation of right lower I87.331 extremity Chronic venous hypertension (idiopathic) with ulcer and inflammation of left lower I87.332 extremity Physician Procedures CPT4: Description Modifier Quantity Code [id number] - WC PHYS LEVEL 3 - EST PT 1 ICD-10 Description Diagnosis E11.622 Type 2 diabetes mellitus with other skin ulcer I89.0 Lymphedema, not elsewhere classified I87.331 Chronic venous hypertension  (idiopathic) with ulcer and inflammation of right lower extremity I87.332 Chronic venous hypertension (idiopathic) with ulcer and inflammation of left lower extremity Electronic Signature(s) Signed: 07/09/2015 1:28:54 PM By: Christin Fudge MD, FACS Entered By: Christin Fudge on 07/09/2015 13:28:54

## 2015-07-09 NOTE — Progress Notes (Signed)
FINNBAR, VARDA ([id number]) Visit Report for 07/09/2015 Arrival Information Details Patient Name: [name] [name], [name] [name] Date of Service: 07/09/2015 12:45 PM Medical Record Number: [id number] Patient Account Number: [id number] Date of Birth/Sex: 1926/08/20 (82 y.o. Male) Treating RN: Macarthur Critchley Primary Care Physician: Alvester Chou Other Clinician: Referring Physician: Alvester Chou Treating Physician/Extender: Frann Rider in Treatment: 13 Visit Information History Since Last Visit All ordered tests and consults were completed: No Patient Arrived: [name] [name] Added or deleted any medications: No Arrival Time: 12:48 Any new allergies or adverse reactions: No Accompanied By: caregiver Had a fall or experienced change in No Transfer Assistance: None activities of daily living that may affect Patient Identification Verified: Yes risk of falls: Secondary Verification Process Yes Signs or symptoms of abuse/neglect since last No Completed: visito Patient Has Alerts: Yes Hospitalized since last visit: No Has Dressing in Place as Prescribed: Yes Has Compression in Place as Prescribed: Yes Pain Present Now: No Electronic Signature(s) Signed: 07/09/2015 2:46:25 PM By: Rebecca Eaton, RN, [name] [name] Entered By: Rebecca Eaton RN, [name] [name] on 07/09/2015 12:49:07 [name] [name] ([id number]) -------------------------------------------------------------------------------- Encounter Discharge Information Details Patient Name: [name] [name] Date of Service: 07/09/2015 12:45 PM Medical Record Number: [id number] Patient Account Number: [id number] Date of Birth/Sex: April 30, 1926 (82 y.o. Male) Treating RN: Macarthur Critchley Primary Care Physician: Alvester Chou Other Clinician: Referring Physician: Alvester Chou Treating Physician/Extender: Frann Rider in Treatment: 13 Encounter Discharge Information Items Facility Notification Discharge Pain Level: 0 Facility Type: Home Health Discharge Condition: Stable Orders Sent:  Yes Ambulatory Status: Walker Discharge Destination: Home Private Transportation: Auto Accompanied By: caregiver Schedule Follow-up Appointment: Yes Medication Reconciliation completed and Yes provided to Patient/Care Jalissa Heinzelman: Clinical Summary of Care: Electronic Signature(s) Signed: 07/09/2015 2:46:25 PM By: Rebecca Eaton, RN, [name] [name] Entered By: Rebecca Eaton, RN, [name] [name] on 07/09/2015 13:10:01 [name] [name] ([id number]) -------------------------------------------------------------------------------- Lower Extremity Assessment Details Patient Name: [name] [name] Date of Service: 07/09/2015 12:45 PM Medical Record Number: [id number] Patient Account Number: [id number] Date of Birth/Sex: [birth year] (82 y.o. Male) Treating RN: Macarthur Critchley Primary Care Physician: Alvester Chou Other Clinician: Referring Physician: Alvester Chou Treating Physician/Extender: Frann Rider in Treatment: 13 Edema Assessment Assessed: [Left: No] [Right: No] Edema: [Left: Yes] [Right: Yes] Calf Left: Right: Point of Measurement: 32 cm From Medial Instep 38 cm 36 cm Ankle Left: Right: Point of Measurement: 12 cm From Medial Instep 29.2 cm 27 cm Vascular Assessment Pulses: Posterior Tibial Dorsalis Pedis Doppler: [Left:Monophasic] [Right:Monophasic] Extremity colors, hair growth, and conditions: Extremity Color: [Left:Mottled] [Right:Mottled] Hair Growth on Extremity: [Left:No] [Right:No] Temperature of Extremity: [Left:Warm] [Right:Warm] Capillary Refill: [Left:> 3 seconds] [Right:> 3 seconds] Toe Nail Assessment Left: Right: Thick: Yes Yes Discolored: No No Deformed: No No Improper Length and Hygiene: Yes Yes Electronic Signature(s) Signed: 07/09/2015 2:46:25 PM By: Rebecca Eaton, RN, [name] [name] Entered By: Rebecca Eaton RN, [name] [name] on 07/09/2015 13:04:06 [name] [name] ([id number]) -------------------------------------------------------------------------------- Pain Assessment Details Patient Name: [name] [name] Date of Service: 07/09/2015 12:45 PM Medical Record Number: [id number] Patient Account Number: [id number] Date of Birth/Sex: May 19, 1926 (82 y.o. Male) Treating RN: Macarthur Critchley Primary Care Physician: Alvester Chou Other Clinician: Referring Physician: Alvester Chou Treating Physician/Extender: Frann Rider in Treatment: 13 Active Problems Location of Pain Severity and Description of Pain Patient Has Paino No Site Locations Rate the pain. Current Pain Level: 0 Pain Management and Medication Current Pain Management: Electronic Signature(s) Signed: 07/09/2015 2:46:25 PM By: Rebecca Eaton, RN, [name] [name] Entered By: Rebecca Eaton RN, [name] [name] on 07/09/2015 12:49:16 [name] [name] ([id number]) -------------------------------------------------------------------------------- Patient/Caregiver Education Details Patient Name: [name] [name] Date of Service: 07/09/2015 12:45 PM Medical Record  Number: [id number] Patient Account Number: [id number] Date of Birth/Gender: [birth year] (82 y.o. Male) Treating RN: Macarthur Critchley Primary Care Physician: Alvester Chou Other Clinician: Referring Physician: Alvester Chou Treating Physician/Extender: Frann Rider in Treatment: 13 Education Assessment Education Provided To: Patient and Caregiver Education Topics Provided Wound/Skin Impairment: Handouts: Caring for Your Ulcer, Skin Care Do's and Dont's Methods: Explain/Verbal Responses: State content correctly Electronic Signature(s) Signed: 07/09/2015 2:46:25 PM By: Rebecca Eaton, RN, [name] [name] Entered By: Rebecca Eaton RN, [name] [name] on 07/09/2015 13:09:14 [name] [name] ([id number]) -------------------------------------------------------------------------------- Wound Assessment Details Patient Name: [name] [name] Date of Service: 07/09/2015 12:45 PM Medical Record Number: [id number] Patient Account Number: [id number] Date of Birth/Sex: [birth year] (82 y.o. Male) Treating RN: Macarthur Critchley Primary Care Physician:  Alvester Chou Other Clinician: Referring Physician: Alvester Chou Treating Physician/Extender: Frann Rider in Treatment: 13 Wound Status Wound Number: 1 Primary Lymphedema Etiology: Wound Location: Right Lower Leg - Circumfernential Wound Open Status: Wounding Event: Gradually Appeared Comorbid Congestive Heart Failure, Date Acquired: 12/28/2014 History: Hypertension, Type II Diabetes, Weeks Of Treatment: 13 Neuropathy Clustered Wound: Yes Photos Photo Uploaded By: Rebecca Eaton, RN, Roslynn Amble on 07/09/2015 14:25:55 Wound Measurements Length: (cm) 5 Width: (cm) 6 Depth: (cm) 0.1 Area: (cm) 23.562 Volume: (cm) 2.356 % Reduction in Area: 93.9% % Reduction in Volume: 93.9% Epithelialization: Large (67-100%) Tunneling: No Undermining: No Wound Description Classification: Partial Thickness Diabetic Severity Earleen Newport): Grade 0 Wound Margin: Indistinct, nonvisible Exudate Amount: Large Exudate Type: Serous Exudate Color: amber Foul Odor After Cleansing: No Wound Bed Granulation Amount: Large (67-100%) Exposed Structure Granulation Quality: Pink, Pale Fascia Exposed: No Arvidson, Gid ([id number]) Necrotic Amount: Small (1-33%) Fat Layer Exposed: No Necrotic Quality: Adherent Slough Tendon Exposed: No Muscle Exposed: No Joint Exposed: No Bone Exposed: No Limited to Skin Breakdown Periwound Skin Texture Texture Color No Abnormalities Noted: No No Abnormalities Noted: No Callus: No Atrophie Blanche: No Crepitus: No Cyanosis: No Excoriation: No Ecchymosis: No Fluctuance: No Erythema: No Friable: No Hemosiderin Staining: No Induration: No Mottled: No Localized Edema: No Pallor: No Rash: No Rubor: No Scarring: No Temperature / Pain Moisture Temperature: No Abnormality No Abnormalities Noted: No Tenderness on Palpation: Yes Dry / Scaly: No Maceration: Yes Moist: Yes Wound Preparation Ulcer Cleansing: Other: soap AND WATER, Topical Anesthetic Applied:  None Treatment Notes Wound #1 (Right, Circumferential Lower Leg) 1. Cleansed with: Cleanse wound with antibacterial soap and water 4. Dressing Applied: Aquacel Ag 5. Secondary Dressing Applied ABD Pad 7. Secured with Water engineer) Signed: 07/09/2015 2:46:25 PM By: Rebecca Eaton, RN, [name] [name] Entered By: Rebecca Eaton RN, [name] [name] on 07/09/2015 13:06:00 [name] [name] ([id number]) -------------------------------------------------------------------------------- Wound Assessment Details Patient Name: [name] [name] Date of Service: 07/09/2015 12:45 PM Medical Record Number: [id number] Patient Account Number: [id number] Date of Birth/Sex: 06-Oct-1926 (82 y.o. Male) Treating RN: Macarthur Critchley Primary Care Physician: Alvester Chou Other Clinician: Referring Physician: Alvester Chou Treating Physician/Extender: Frann Rider in Treatment: 13 Wound Status Wound Number: 2 Primary Lymphedema Etiology: Wound Location: Left Lower Leg - Circumfernential Wound Open Status: Wounding Event: Gradually Appeared Comorbid Congestive Heart Failure, Date Acquired: 12/28/2014 History: Hypertension, Type II Diabetes, Weeks Of Treatment: 13 Neuropathy Clustered Wound: Yes Photos Photo Uploaded By: Rebecca Eaton, RN, Roslynn Amble on 07/09/2015 14:25:56 Wound Measurements Length: (cm) 14 Width: (cm) 22 Depth: (cm) 0.1 Area: (cm) 241.903 Volume: (cm) 24.19 % Reduction in Area: 8.3% % Reduction in Volume: 8.3% Epithelialization: Large (67-100%) Tunneling: No Undermining: No Wound Description Classification: Partial Thickness Diabetic Severity Earleen Newport): Grade 1 Wound Margin: Indistinct, nonvisibl Exudate Amount: Large Exudate Type: Serous Exudate  Color: amber Foul Odor After Cleansing: Yes Due to Product Use: No e Wound Bed Granulation Amount: Large (67-100%) Exposed Structure Granulation Quality: Red, Pink, Hyper-granulation Fascia Exposed: No Kanady, Culver  ([id number]) Necrotic Amount: Small (1-33%) Fat Layer Exposed: No Necrotic Quality: Adherent Slough Tendon Exposed: No Muscle Exposed: No Joint Exposed: No Bone Exposed: No Limited to Skin Breakdown Periwound Skin Texture Texture Color No Abnormalities Noted: No No Abnormalities Noted: No Callus: No Atrophie Blanche: No Crepitus: No Cyanosis: No Excoriation: No Ecchymosis: No Fluctuance: No Erythema: No Friable: No Hemosiderin Staining: No Induration: No Mottled: No Localized Edema: Yes Pallor: No Rash: No Rubor: No Scarring: No Temperature / Pain Moisture Temperature: No Abnormality No Abnormalities Noted: No Tenderness on Palpation: Yes Dry / Scaly: No Maceration: Yes Moist: Yes Wound Preparation Ulcer Cleansing: Other: soap and water, Topical Anesthetic Applied: None, Other: lidocaine 4%, Treatment Notes Wound #2 (Left, Circumferential Lower Leg) 1. Cleansed with: Cleanse wound with antibacterial soap and water 4. Dressing Applied: Aquacel Ag 5. Secondary Dressing Applied ABD Pad 7. Secured with Water engineer) Signed: 07/09/2015 2:46:25 PM By: Rebecca Eaton, RN, [name] [name] Entered By: Rebecca Eaton RN, [name] [name] on 07/09/2015 13:06:11 [name] [name] ([id number]) -------------------------------------------------------------------------------- Wound Assessment Details Patient Name: [name] [name] Date of Service: 07/09/2015 12:45 PM Medical Record Number: [id number] Patient Account Number: [id number] Date of Birth/Sex: [birth year] (82 y.o. Male) Treating RN: Macarthur Critchley Primary Care Physician: Alvester Chou Other Clinician: Referring Physician: Alvester Chou Treating Physician/Extender: Frann Rider in Treatment: 13 Wound Status Wound Number: 3 Primary Lymphedema Etiology: Wound Location: Left Foot - Plantar Wound Open Wounding Event: Gradually Appeared Status: Date Acquired: 01/27/2015 Comorbid Congestive Heart Failure, Weeks Of  Treatment: 13 History: Hypertension, Type II Diabetes, Clustered Wound: No Neuropathy Photos Photo Uploaded By: Rebecca Eaton, RN, Roslynn Amble on 07/09/2015 14:26:24 Wound Measurements Length: (cm) 1 Width: (cm) 3 Depth: (cm) 0.1 Area: (cm) 2.356 Volume: (cm) 0.236 % Reduction in Area: 68.8% % Reduction in Volume: 68.7% Epithelialization: Small (1-33%) Tunneling: No Undermining: No Wound Description Classification: Partial Thickness Diabetic Severity Earleen Newport): Grade 0 Wound Margin: Indistinct, nonvisible Exudate Amount: Medium Exudate Type: Serous Exudate Color: amber Foul Odor After Cleansing: No Wound Bed Granulation Amount: Large (67-100%) Exposed Structure Granulation Quality: Pink Fascia Exposed: No Necrotic Amount: Small (1-33%) Fat Layer Exposed: No Iiams, Donevan ([id number]) Necrotic Quality: Adherent Slough Tendon Exposed: No Muscle Exposed: No Joint Exposed: No Bone Exposed: No Limited to Skin Breakdown Periwound Skin Texture Texture Color No Abnormalities Noted: No No Abnormalities Noted: No Callus: No Atrophie Blanche: No Crepitus: No Cyanosis: No Excoriation: No Ecchymosis: No Fluctuance: No Erythema: No Friable: No Hemosiderin Staining: No Induration: No Mottled: No Localized Edema: No Pallor: No Rash: No Rubor: No Scarring: No Temperature / Pain Moisture Temperature: No Abnormality No Abnormalities Noted: No Dry / Scaly: No Maceration: Yes Moist: Yes Wound Preparation Ulcer Cleansing: Other: water and soap, Topical Anesthetic Applied: Other: lidocaine 4%, Treatment Notes Wound #3 (Left, Plantar Foot) 1. Cleansed with: Cleanse wound with antibacterial soap and water 4. Dressing Applied: Aquacel Ag 5. Secondary Dressing Applied ABD Pad 7. Secured with Water engineer) Signed: 07/09/2015 2:46:25 PM By: Rebecca Eaton, RN, [name] [name] Entered By: Rebecca Eaton RN, [name] [name] on 07/09/2015 13:06:18 [name] [name]  ([id number]) -------------------------------------------------------------------------------- Wound Assessment Details Patient Name: [name] [name] Date of Service: 07/09/2015 12:45 PM Medical Record Number: [id number] Patient Account Number: [id number] Date of Birth/Sex: 1927-04-03 (82 y.o. Male) Treating RN: Macarthur Critchley Primary Care Physician: Alvester Chou Other Clinician: Referring Physician: Alvester Chou Treating  Physician/Extender: Frann Rider in Treatment: 13 Wound Status Wound Number: 5 Primary Lymphedema Etiology: Wound Location: Left Foot - Dorsal Wound Open Wounding Event: Gradually Appeared Status: Date Acquired: 07/09/2015 Comorbid Congestive Heart Failure, Weeks Of Treatment: 0 History: Hypertension, Type II Diabetes, Clustered Wound: No Neuropathy Wound Measurements Length: (cm) 10.5 Width: (cm) 7 Depth: (cm) 1 Area: (cm) 57.727 Volume: (cm) 57.727 % Reduction in Area: % Reduction in Volume: Epithelialization: None Tunneling: No Undermining: No Wound Description Classification: Partial Thickness Foul Odor A Diabetic Severity (Wagner): Grade 1 Wound Margin: Flat and Intact Exudate Amount: Large Exudate Type: Serous Exudate Color: amber fter Cleansing: No Wound Bed Granulation Amount: Large (67-100%) Exposed Structure Granulation Quality: Red, Pink Fascia Exposed: No Necrotic Amount: Small (1-33%) Fat Layer Exposed: No Necrotic Quality: Adherent Slough Tendon Exposed: No Muscle Exposed: No Joint Exposed: No Bone Exposed: No Limited to Skin Breakdown Periwound Skin Texture Texture Color No Abnormalities Noted: No No Abnormalities Noted: No Callus: No Atrophie Blanche: No Crepitus: No Cyanosis: No Muller, [name] ([id number]) Excoriation: No Ecchymosis: No Fluctuance: No Erythema: No Friable: No Hemosiderin Staining: No Induration: No Mottled: No Localized Edema: No Pallor: No Rash: No Rubor: No Scarring: No Moisture No  Abnormalities Noted: No Dry / Scaly: No Maceration: Yes Moist: Yes Wound Preparation Ulcer Cleansing: Other: soap and water, Topical Anesthetic Applied: None Treatment Notes Wound #5 (Left, Dorsal Foot) 1. Cleansed with: Cleanse wound with antibacterial soap and water 4. Dressing Applied: Aquacel Ag 5. Secondary Dressing Applied ABD Pad 7. Secured with Water engineer) Signed: 07/09/2015 2:46:25 PM By: Rebecca Eaton, RN, [name] [name] Entered By: Rebecca Eaton RN, [name] [name] on 07/09/2015 13:08:04 [name] [name] ([id number]) -------------------------------------------------------------------------------- Vitals Details Patient Name: [name] [name] Date of Service: 07/09/2015 12:45 PM Medical Record Number: [id number] Patient Account Number: [id number] Date of Birth/Sex: 06-20-1926 (82 y.o. Male) Treating RN: Macarthur Critchley Primary Care Physician: Alvester Chou Other Clinician: Referring Physician: Alvester Chou Treating Physician/Extender: Frann Rider in Treatment: 13 Vital Signs Time Taken: 12:45 Pulse (bpm): 68 Height (in): 70 Blood Pressure (mmHg): 109/54 Weight (lbs): 230 Reference Range: 80 - 120 mg / dl Body Mass Index (BMI): 33 Electronic Signature(s) Signed: 07/09/2015 2:46:25 PM By: Rebecca Eaton RN, [name] [name] Entered By: Rebecca Eaton RN, [name] [name] on 07/09/2015 13:35:44

## 2015-07-23 ENCOUNTER — Encounter: Payer: Medicare Other | Admitting: Surgery

## 2015-07-23 DIAGNOSIS — E11622 Type 2 diabetes mellitus with other skin ulcer: Secondary | ICD-10-CM | POA: Diagnosis not present

## 2015-07-23 NOTE — Progress Notes (Addendum)
[name], [name] ([id number]) Visit Report for 07/23/2015 Chief Complaint Document Details Patient Name: [name] [name], [name] [name] Date of Service: 07/23/2015 2:00 PM Medical Record Number: [id number] Patient Account Number: [id number] Date of Birth/Sex: 1926-12-17 (82 y.o. Male) Treating RN: Macarthur Critchley Primary Care Physician: Alvester Chou Other Clinician: Referring Physician: Alvester Chou Treating Physician/Extender: Frann Rider in Treatment: 15 Information Obtained from: Patient Chief Complaint Patient presents to the wound care center for a consult due non healing wound both lower extremities. The patient is a poor historian and was seen here once in July 2016. He says home health was doing his dressings and he was healed but now has recurrence of the problem. Problem has been there at least for 6 months. Electronic Signature(s) Signed: 07/23/2015 3:12:45 PM By: Christin Fudge MD, FACS Entered By: Christin Fudge on 07/23/2015 15:12:45 [name] [name] ([id number]) -------------------------------------------------------------------------------- HPI Details Patient Name: [name] [name] Date of Service: 07/23/2015 2:00 PM Medical Record Number: [id number] Patient Account Number: [id number] Date of Birth/Sex: 06-03-1926 (82 y.o. Male) Treating RN: Macarthur Critchley Primary Care Physician: Alvester Chou Other Clinician: Referring Physician: Alvester Chou Treating Physician/Extender: Frann Rider in Treatment: 15 History of Present Illness Location: swelling of the legs right more than left an ulceration both lower extremities Quality: Patient reports experiencing a dull pain to affected area(s). Severity: Patient states wound (s) are getting worse Duration: Patient has had the wound for > 6 months prior to seeking treatment at the wound center Timing: Pain in wound is Intermittent. Context: The wound appeared gradually over time Modifying Factors: he has been having home health come and apply his  dressing daily was healed. Associated Signs and Symptoms: Patient reports having difficulty standing for long periods. HPI Description: [name] [name] is a 82 y.o. male with history of DM, hypertension, chronic kidney disease, gout, diastolic dysfunction and the patient was having increasing swelling with increasing fluid draining from the legs and pain over the last 6 weeks. He says his wounds are completely healed and the home health had stopped coming to do his dressings. Recent hemoglobin A1c was 6.6. During the last admission A venous duplex study revealed no evidence of deep vein thrombosis or superficial thrombosis involving the right and left lower extremity. Was seen in January 2016 by Dr. Adele Barthel at [facility]: Non-Invasive Vascular Imaging ABI (05/05/2014) o R: 0.65 (0.73), DP: mono, PT: mono, TBI: 0.57 o L: 0.65 (0.69), DP: mono, PT: mono, TBI: 0.36 The patient had the diagnosis of bilateral lower extremity moderate peripheral atherosclerotic disease, bilateral lower extremity chronic venous insufficiency. Due to the patient's age and mental status conservative therapy was only discussed and no surgical intervention was planned. The patient has significant dementia and has a caregiver who spends a few hours every day coming to see him. He does not have any family members. 05/21/2015 -- lower extremity arterial studies done on 05/17/2015 show the right ABI is 0.50-0.79 indicating moderate arterial occlusive disease at rest. The left ABI was 0.5-0.79 indicating moderate arterial occlusive disease at rest. The left posterior tibial and peroneal arteries were not evaluated due to local wound condition. The right TBI was less than 0.69 and the left ABI was less than 0.69 which are both abnormal. Since it's been a year since the previous opinion from Dr. Bridgett Larsson I have asked him to see him for a review. 05/28/2015 -- was seen by Dr. Bridgett Larsson and he recommended that he was not a good candidate for  surgery and surgical revascularization if the need develops. He  recommends to continue with compressive therapy and wound care. 06/04/2015 -- had asked him to remove his compression wraps have a shower and come to see as but he has probably been scratching his legs and caused a lot of excoriation and has multiple open ulcers which [name], [name] ([id number]) are much more worse than before. The patient is not very compliant and may be this is because of his dementia. Electronic Signature(s) Signed: 07/23/2015 3:12:53 PM By: Christin Fudge MD, FACS Entered By: Christin Fudge on 07/23/2015 15:12:53 [name] [name] ([id number]) -------------------------------------------------------------------------------- Physical Exam Details Patient Name: [name] [name] Date of Service: 07/23/2015 2:00 PM Medical Record Number: [id number] Patient Account Number: [id number] Date of Birth/Sex: 1926-08-31 (82 y.o. Male) Treating RN: Macarthur Critchley Primary Care Physician: Alvester Chou Other Clinician: Referring Physician: Alvester Chou Treating Physician/Extender: Frann Rider in Treatment: 15 Constitutional . Pulse regular. Respirations normal and unlabored. Afebrile. . Eyes Nonicteric. Reactive to light. Ears, Nose, Mouth, and Throat Lips, teeth, and gums WNL.Marland Kitchen Moist mucosa without lesions. Neck supple and nontender. No palpable supraclavicular or cervical adenopathy. Normal sized without goiter. Respiratory WNL. No retractions.. Cardiovascular Pedal Pulses WNL. No clubbing, cyanosis or edema. Lymphatic No adneopathy. No adenopathy. No adenopathy. Musculoskeletal Adexa without tenderness or enlargement.. Digits and nails w/o clubbing, cyanosis, infection, petechiae, ischemia, or inflammatory conditions.. Integumentary (Hair, Skin) No suspicious lesions. No crepitus or fluctuance. No peri-wound warmth or erythema. No masses.Marland Kitchen Psychiatric Judgement and insight Intact.. No evidence of depression,  anxiety, or agitation.. Notes the left lower extremity and the right lateral lower extremity continued to have significant ulceration and his lymphedema stage II. His hygiene is still subnormal and overall we are going to continue with palliative care. Electronic Signature(s) Signed: 07/23/2015 3:13:52 PM By: Christin Fudge MD, FACS Entered By: Christin Fudge on 07/23/2015 15:13:50 [name] [name] ([id number]) -------------------------------------------------------------------------------- Physician Orders Details Patient Name: [name] [name] Date of Service: 07/23/2015 2:00 PM Medical Record Number: [id number] Patient Account Number: [id number] Date of Birth/Sex: [birth year] (82 y.o. Male) Treating RN: Macarthur Critchley Primary Care Physician: Alvester Chou Other Clinician: Referring Physician: Alvester Chou Treating Physician/Extender: Frann Rider in Treatment: 15 Verbal / Phone Orders: Yes Clinician: Macarthur Critchley Read Back and Verified: Yes Diagnosis Coding Wound Cleansing Wound #1 Right,Circumferential Lower Leg o May shower with protection. Wound #2 Left,Circumferential Lower Leg o May shower with protection. Wound #3 Left,Plantar Foot o May shower with protection. Wound #5 Left,Dorsal Foot o May shower with protection. Primary Wound Dressing Wound #1 Right,Circumferential Lower Leg o Aquacel Ag Wound #2 Left,Circumferential Lower Leg o Aquacel Ag Wound #3 Left,Plantar Foot o Aquacel Ag Wound #5 Left,Dorsal Foot o Aquacel Ag Secondary Dressing Wound #1 Right,Circumferential Lower Leg o ABD pad o Dry Gauze o Drawtex Wound #2 Left,Circumferential Lower Leg o ABD pad o Dry Gauze o Drawtex [name], [name] ([id number]) Wound #3 Left,Plantar Foot o ABD pad o Dry Gauze o Drawtex Wound #5 Left,Dorsal Foot o ABD pad o Dry Gauze o Drawtex Dressing Change Frequency Wound #1 Right,Circumferential Lower Leg o Three times  weekly Wound #2 Left,Circumferential Lower Leg o Three times weekly Wound #3 Left,Plantar Foot o Three times weekly Wound #5 Left,Dorsal Foot o Three times weekly Follow-up Appointments Wound #1 Right,Circumferential Lower Leg o Return Appointment in 2 weeks. - HHRN to come 3x/week next week Wound #2 Left,Circumferential Lower Leg o Return Appointment in 2 weeks. - HHRN to come 3x/week next week Wound #3 Left,Plantar Foot o Return Appointment in 2 weeks. - HHRN to come 3x/week next week  Wound #5 Left,Dorsal Foot o Return Appointment in 2 weeks. - HHRN to come 3x/week next week Edema Control Wound #1 Right,Circumferential Lower Leg o Unna Boots Bilaterally o Other: - bring wraps that was shipped to house Wound #2 Left,Circumferential Lower Leg o Unna Boots Bilaterally o Other: - bring wraps that was shipped to house Wound #3 Left,Plantar Foot [address] [name] [name], [name] [name] ([id number]) o Other: - bring wraps that was shipped to house Wound #5 Left,Dorsal Foot o Unna Boots Bilaterally o Other: - bring wraps that was shipped to [place] #1 [place] Visits - Please see Mr Monin 3x next week. Return to clinic in two weeks (March 27) o [place] Nurse may visit PRN to address patientos wound care needs. o Home Health Nurse may visit PRN to address patientos wound care needs. o FACE TO FACE ENCOUNTER: MEDICARE and MEDICAID PATIENTS: I certify that this patient is under my care and that I had a face-to-face encounter that meets the physician face-to-face encounter requirements with this patient on this date. The encounter with the patient was in whole or in part for the following MEDICAL CONDITION: (primary reason for [place]) MEDICAL NECESSITY: I certify, that based on my findings, NURSING services are a medically necessary home health  service. HOME BOUND STATUS: I certify that my clinical findings support that this patient is homebound (i.e., Due to illness or injury, pt requires aid of supportive devices such as crutches, cane, wheelchairs, walkers, the use of special transportation or the assistance of another person to leave their place of residence. There is a normal inability to leave the home and doing so requires considerable and taxing effort. Other absences are for medical reasons / religious services and are infrequent or of short duration when for other reasons). o FACE TO FACE ENCOUNTER: MEDICARE and MEDICAID PATIENTS: I certify that this patient is under my care and that I had a face-to-face encounter that meets the physician face-to-face encounter requirements with this patient on this date. The encounter with the patient was in whole or in part for the following MEDICAL CONDITION: (primary reason for [place]) MEDICAL NECESSITY: I certify, that based on my findings, NURSING services are a medically necessary home health service. HOME BOUND STATUS: I certify that my clinical findings support that this patient is homebound (i.e., Due to illness or injury, pt requires aid of supportive devices such as crutches, cane, wheelchairs, walkers, the use of special transportation or the assistance of another person to leave their place of residence. There is a normal inability to leave the home and doing so requires considerable and taxing effort. Other absences are for medical reasons / religious services and are infrequent or of short duration when for other reasons). o If current dressing causes regression in wound condition, may D/C ordered dressing product/s and apply Normal Saline Moist Dressing daily until next [place] / Other MD appointment. [place] of regression in wound condition at [phone]. o If current dressing causes regression in wound condition, may D/C  ordered dressing product/s and apply Normal Saline Moist Dressing daily until next [place] / Other MD appointment. [place] of regression in wound condition at [phone]. o Please direct any NON-WOUND related issues/requests for orders to patient's Primary Care Physician o Please direct any NON-WOUND related issues/requests for orders to patient's Primary Care Physician Wound #2 Left,Circumferential Lower Leg  ROARKE, DUTKIEWICZ ([id number]) o [place] Visits - Please see Mr Bonenfant 3x next week. Return to clinic in two weeks (March 27) o [place] Nurse may visit PRN to address patientos wound care needs. o Home Health Nurse may visit PRN to address patientos wound care needs. o FACE TO FACE ENCOUNTER: MEDICARE and MEDICAID PATIENTS: I certify that this patient is under my care and that I had a face-to-face encounter that meets the physician face-to-face encounter requirements with this patient on this date. The encounter with the patient was in whole or in part for the following MEDICAL CONDITION: (primary reason for [place]) MEDICAL NECESSITY: I certify, that based on my findings, NURSING services are a medically necessary home health service. HOME BOUND STATUS: I certify that my clinical findings support that this patient is homebound (i.e., Due to illness or injury, pt requires aid of supportive devices such as crutches, cane, wheelchairs, walkers, the use of special transportation or the assistance of another person to leave their place of residence. There is a normal inability to leave the home and doing so requires considerable and taxing effort. Other absences are for medical reasons / religious services and are infrequent or of short duration when for other reasons). o FACE TO FACE ENCOUNTER: MEDICARE and MEDICAID PATIENTS: I certify that this patient is under my care and that I had a  face-to-face encounter that meets the physician face-to-face encounter requirements with this patient on this date. The encounter with the patient was in whole or in part for the following MEDICAL CONDITION: (primary reason for [place]) MEDICAL NECESSITY: I certify, that based on my findings, NURSING services are a medically necessary home health service. HOME BOUND STATUS: I certify that my clinical findings support that this patient is homebound (i.e., Due to illness or injury, pt requires aid of supportive devices such as crutches, cane, wheelchairs, walkers, the use of special transportation or the assistance of another person to leave their place of residence. There is a normal inability to leave the home and doing so requires considerable and taxing effort. Other absences are for medical reasons / religious services and are infrequent or of short duration when for other reasons). o If current dressing causes regression in wound condition, may D/C ordered dressing product/s and apply Normal Saline Moist Dressing daily until next [place] / Other MD appointment. [place] of regression in wound condition at [phone]. o If current dressing causes regression in wound condition, may D/C ordered dressing product/s and apply Normal Saline Moist Dressing daily until next [place] / Other MD appointment. [place] of regression in wound condition at [phone]. o Please direct any NON-WOUND related issues/requests for orders to patient's Primary Care Physician o Please direct any NON-WOUND related issues/requests for orders to patient's Primary Care Physician Wound #3 [place] Visits - Please see Mr Seachrist 3x next week. Return to clinic in two weeks (March 27) o [place] Nurse may visit PRN to address patientos wound care needs. o Home Health  Nurse may visit PRN to address patientos wound care needs. o FACE TO FACE ENCOUNTER: MEDICARE and MEDICAID PATIENTS: I certify that this patient is under my care and that I had a face-to-face encounter that meets the physician face-to-face encounter requirements with this patient on this date. The encounter with the patient was in whole or in part  for the following MEDICAL CONDITION: (primary reason for [place]) [name] [name], [name] [name] ([id number]) MEDICAL NECESSITY: I certify, that based on my findings, NURSING services are a medically necessary home health service. HOME BOUND STATUS: I certify that my clinical findings support that this patient is homebound (i.e., Due to illness or injury, pt requires aid of supportive devices such as crutches, cane, wheelchairs, walkers, the use of special transportation or the assistance of another person to leave their place of residence. There is a normal inability to leave the home and doing so requires considerable and taxing effort. Other absences are for medical reasons / religious services and are infrequent or of short duration when for other reasons). o FACE TO FACE ENCOUNTER: MEDICARE and MEDICAID PATIENTS: I certify that this patient is under my care and that I had a face-to-face encounter that meets the physician face-to-face encounter requirements with this patient on this date. The encounter with the patient was in whole or in part for the following MEDICAL CONDITION: (primary reason for [place]) MEDICAL NECESSITY: I certify, that based on my findings, NURSING services are a medically necessary home health service. HOME BOUND STATUS: I certify that my clinical findings support that this patient is homebound (i.e., Due to illness or injury, pt requires aid of supportive devices such as crutches, cane, wheelchairs, walkers, the use of special transportation or the assistance of another person to leave their place of residence. There is  a normal inability to leave the home and doing so requires considerable and taxing effort. Other absences are for medical reasons / religious services and are infrequent or of short duration when for other reasons). o If current dressing causes regression in wound condition, may D/C ordered dressing product/s and apply Normal Saline Moist Dressing daily until next [place] / Other MD appointment. [place] of regression in wound condition at [phone]. o If current dressing causes regression in wound condition, may D/C ordered dressing product/s and apply Normal Saline Moist Dressing daily until next [place] / Other MD appointment. [place] of regression in wound condition at [phone]. o Please direct any NON-WOUND related issues/requests for orders to patient's Primary Care Physician o Please direct any NON-WOUND related issues/requests for orders to patient's Primary Care Physician Wound #5 [place] Visits - Please see Mr Mondor 3x next week. Return to clinic in two weeks (March 27) o [place] Nurse may visit PRN to address patientos wound care needs. o Home Health Nurse may visit PRN to address patientos wound care needs. o FACE TO FACE ENCOUNTER: MEDICARE and MEDICAID PATIENTS: I certify that this patient is under my care and that I had a face-to-face encounter that meets the physician face-to-face encounter requirements with this patient on this date. The encounter with the patient was in whole or in part for the following MEDICAL CONDITION: (primary reason for [place]) MEDICAL NECESSITY: I certify, that based on my findings, NURSING services are a medically necessary home health service. HOME BOUND STATUS: I certify that my clinical findings support that this patient is homebound (i.e., Due to illness or injury, pt requires aid  of supportive devices such as crutches, cane, wheelchairs, walkers, the use of special transportation or the assistance of another person to leave their place of residence. There is a normal inability to leave the home and doing so requires considerable and taxing effort. Other absences are for medical reasons /  religious services and are infrequent or of short duration when for other reasons). [name] [name], [name] [name] ([id number]) o FACE TO FACE ENCOUNTER: MEDICARE and MEDICAID PATIENTS: I certify that this patient is under my care and that I had a face-to-face encounter that meets the physician face-to-face encounter requirements with this patient on this date. The encounter with the patient was in whole or in part for the following MEDICAL CONDITION: (primary reason for [place]) MEDICAL NECESSITY: I certify, that based on my findings, NURSING services are a medically necessary home health service. HOME BOUND STATUS: I certify that my clinical findings support that this patient is homebound (i.e., Due to illness or injury, pt requires aid of supportive devices such as crutches, cane, wheelchairs, walkers, the use of special transportation or the assistance of another person to leave their place of residence. There is a normal inability to leave the home and doing so requires considerable and taxing effort. Other absences are for medical reasons / religious services and are infrequent or of short duration when for other reasons). o If current dressing causes regression in wound condition, may D/C ordered dressing product/s and apply Normal Saline Moist Dressing daily until next [place] / Other MD appointment. [place] of regression in wound condition at [phone]. o If current dressing causes regression in wound condition, may D/C ordered dressing product/s and apply Normal Saline Moist Dressing daily until next [place] / Other MD appointment.  [place] of regression in wound condition at [phone]. o Please direct any NON-WOUND related issues/requests for orders to patient's Primary Care Physician o Please direct any NON-WOUND related issues/requests for orders to patient's Primary Care Physician Electronic Signature(s) Signed: 07/23/2015 4:52:55 PM By: Christin Fudge MD, FACS Signed: 07/23/2015 5:13:11 PM By: Rebecca Eaton RN, [place] Entered By: Rebecca Eaton RN, [place] on 07/23/2015 15:28:07 [name] [name] ([id number]) -------------------------------------------------------------------------------- Problem List Details Patient Name: [name] [name] Date of Service: 07/23/2015 2:00 PM Medical Record Number: [id number] Patient Account Number: [id number] Date of Birth/Sex: August 20, 1926 (82 y.o. Male) Treating RN: Macarthur Critchley Primary Care Physician: Alvester Chou Other Clinician: Referring Physician: Alvester Chou Treating Physician/Extender: Frann Rider in Treatment: 15 Active Problems ICD-10 Encounter Code Description Active Date Diagnosis E11.622 Type 2 diabetes mellitus with other skin ulcer 04/09/2015 Yes I89.0 Lymphedema, not elsewhere classified 04/09/2015 Yes I70.238 Atherosclerosis of native arteries of right leg with 04/09/2015 Yes ulceration of other part of lower right leg I70.248 Atherosclerosis of native arteries of left leg with ulceration 04/09/2015 Yes of other part of lower left leg I87.331 Chronic venous hypertension (idiopathic) with ulcer and 04/09/2015 Yes inflammation of right lower extremity I87.332 Chronic venous hypertension (idiopathic) with ulcer and 04/09/2015 Yes inflammation of left lower extremity Inactive Problems Resolved Problems Electronic Signature(s) Signed: 07/23/2015 3:12:35 PM By: Christin Fudge MD, FACS Entered By: Christin Fudge on 07/23/2015 15:12:34 [name] [name]  ([id number]) -------------------------------------------------------------------------------- Progress Note Details Patient Name: [name] [name] Date of Service: 07/23/2015 2:00 PM Medical Record Number: [id number] Patient Account Number: [id number] Date of Birth/Sex: [birth year] (82 y.o. Male) Treating RN: Macarthur Critchley Primary Care Physician: Alvester Chou Other Clinician: Referring Physician: Alvester Chou Treating Physician/Extender: Frann Rider in Treatment: 15 Subjective Chief Complaint Information obtained from Patient Patient presents to the wound care center for a consult due non healing wound both lower extremities. The patient is a poor historian and was seen here once in July 2016. He says home health was doing his dressings and he was healed but now has recurrence  of the problem. Problem has been there at least for 6 months. History of Present Illness (HPI) The following HPI elements were documented for the patient's wound: Location: swelling of the legs right more than left an ulceration both lower extremities Quality: Patient reports experiencing a dull pain to affected area(s). Severity: Patient states wound (s) are getting worse Duration: Patient has had the wound for > 6 months prior to seeking treatment at the wound center Timing: Pain in wound is Intermittent. Context: The wound appeared gradually over time Modifying Factors: he has been having home health come and apply his dressing daily was healed. Associated Signs and Symptoms: Patient reports having difficulty standing for long periods. Random Tousant is a 82 y.o. male with history of DM, hypertension, chronic kidney disease, gout, diastolic dysfunction and the patient was having increasing swelling with increasing fluid draining from the legs and pain over the last 6 weeks. He says his wounds are completely healed and the home health had stopped coming to do his dressings. Recent hemoglobin A1c was 6.6. During  the last admission A venous duplex study revealed no evidence of deep vein thrombosis or superficial thrombosis involving the right and left lower extremity. Was seen in January 2016 by Dr. Adele Barthel at [facility]: Non-Invasive Vascular Imaging ABI (05/05/2014)  R: 0.65 (0.73), DP: mono, PT: mono, TBI: 0.57  L: 0.65 (0.69), DP: mono, PT: mono, TBI: 0.36 The patient had the diagnosis of bilateral lower extremity moderate peripheral atherosclerotic disease, bilateral lower extremity chronic venous insufficiency. Due to the patient's age and mental status conservative therapy was only discussed and no surgical intervention was planned. The patient has significant dementia and has a caregiver who spends a few hours every day coming to see him. He does not have any family members. 05/21/2015 -- lower extremity arterial studies done on 05/17/2015 show the right ABI is 0.50-0.79 indicating moderate arterial occlusive disease at rest. The left ABI was 0.5-0.79 indicating moderate arterial occlusive disease at rest. The left posterior tibial and peroneal arteries were not evaluated due to local wound Bova, [place] ([id number]) condition. The right TBI was less than 0.69 and the left ABI was less than 0.69 which are both abnormal. Since it's been a year since the previous opinion from Dr. Bridgett Larsson I have asked him to see him for a review. 05/28/2015 -- was seen by Dr. Bridgett Larsson and he recommended that he was not a good candidate for surgery and surgical revascularization if the need develops. He recommends to continue with compressive therapy and wound care. 06/04/2015 -- had asked him to remove his compression wraps have a shower and come to see as but he has probably been scratching his legs and caused a lot of excoriation and has multiple open ulcers which are much more worse than before. The patient is not very compliant and may be this is because of his dementia. Objective Constitutional Pulse regular.  Respirations normal and unlabored. Afebrile. Vitals Time Taken: 2:48 PM, Height: 70 in, Weight: 230 lbs, BMI: 33, Temperature: 97.5 F, Pulse: 80 bpm, Blood Pressure: 112/71 mmHg. Eyes Nonicteric. Reactive to light. Ears, Nose, Mouth, and Throat Lips, teeth, and gums WNL.Marland Kitchen Moist mucosa without lesions. Neck supple and nontender. No palpable supraclavicular or cervical adenopathy. Normal sized without goiter. Respiratory WNL. No retractions.. Cardiovascular Pedal Pulses WNL. No clubbing, cyanosis or edema. Lymphatic No adneopathy. No adenopathy. No adenopathy. Musculoskeletal Adexa without tenderness or enlargement.. Digits and nails w/o clubbing, cyanosis, infection, petechiae, ischemia, or inflammatory conditions.Marland Kitchen Psychiatric  [name] [name], [name] [name] ([id number]) Judgement and insight Intact.. No evidence of depression, anxiety, or agitation.. General Notes: the left lower extremity and the right lateral lower extremity continued to have significant ulceration and his lymphedema stage II. His hygiene is still subnormal and overall we are going to continue with palliative care. Integumentary (Hair, Skin) No suspicious lesions. No crepitus or fluctuance. No peri-wound warmth or erythema. No masses.. Wound #1 status is Open. Original cause of wound was Gradually Appeared. The wound is located on the Right,Circumferential Lower Leg. The wound measures 9.5cm length x 14cm width x 0.1cm depth; 104.458cm^2 area and 10.446cm^3 volume. The wound is limited to skin breakdown. There is a large amount of serous drainage noted. The wound margin is indistinct and nonvisible. There is large (67-100%) pink, pale granulation within the wound bed. There is a small (1-33%) amount of necrotic tissue within the wound bed including Adherent Slough. The periwound skin appearance exhibited: Maceration, Moist. The periwound skin appearance did not exhibit: Callus, Crepitus, Excoriation, Fluctuance, Friable,  Induration, Localized Edema, Rash, Scarring, Dry/Scaly, Atrophie Blanche, Cyanosis, Ecchymosis, Hemosiderin Staining, Mottled, Pallor, Rubor, Erythema. Periwound temperature was noted as No Abnormality. The periwound has tenderness on palpation. Wound #2 status is Open. Original cause of wound was Gradually Appeared. The wound is located on the Left,Circumferential Lower Leg. The wound measures 13.5cm length x 28cm width x 0.1cm depth; 296.881cm^2 area and 29.688cm^3 volume. The wound is limited to skin breakdown. There is a large amount of serous drainage noted. The wound margin is indistinct and nonvisible. There is large (67-100%) red, pink granulation within the wound bed. There is a small (1-33%) amount of necrotic tissue within the wound bed including Adherent Slough. The periwound skin appearance exhibited: Localized Edema, Maceration, Moist. The periwound skin appearance did not exhibit: Callus, Crepitus, Excoriation, Fluctuance, Friable, Induration, Rash, Scarring, Dry/Scaly, Atrophie Blanche, Cyanosis, Ecchymosis, Hemosiderin Staining, Mottled, Pallor, Rubor, Erythema. Periwound temperature was noted as No Abnormality. The periwound has tenderness on palpation. Wound #3 status is Open. Original cause of wound was Gradually Appeared. The wound is located on the [place]. The wound measures 1cm length x 3cm width x 0.1cm depth; 2.356cm^2 area and 0.236cm^3 volume. Wound #5 status is Open. Original cause of wound was Gradually Appeared. The wound is located on the Left,Dorsal Foot. The wound measures 1cm length x 1cm width x 0.1cm depth; 0.785cm^2 area and 0.079cm^3 volume. 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 Wound #2 Left,Circumferential Lower Leg: May shower with protection. Wound #3 Left,Plantar Foot: May shower with protection. Wound #5 Left,Dorsal Foot: May shower with protection. Primary Wound Dressing: Wound #1 Right,Circumferential Lower Leg: Aquacel Ag Wound #2 Left,Circumferential Lower Leg: Aquacel Ag Wound #3 Left,Plantar Foot: Aquacel Ag Wound #5 Left,Dorsal Foot: Aquacel Ag Secondary Dressing: Wound #1 Right,Circumferential Lower Leg: ABD pad Dry Gauze Drawtex Wound #2 Left,Circumferential Lower Leg: ABD pad Dry Gauze Drawtex Wound #3 Left,Plantar Foot: ABD pad Dry Gauze Drawtex Wound #5 Left,Dorsal Foot:  ABD pad Dry Gauze Drawtex Dressing Change Frequency: Wound #1 Right,Circumferential Lower Leg: Three times weekly [name] [name], [name] [name] ([id number]) Wound #2 Left,Circumferential Lower Leg: Three times weekly Wound #3 Left,Plantar Foot: Three times weekly Wound #5 Left,Dorsal Foot: Three times weekly Follow-up Appointments: Wound #1 Right,Circumferential Lower Leg: Return Appointment in 2 weeks. - HHRN to come 3x/week next week Wound #2 Left,Circumferential Lower Leg: Return Appointment in 2 weeks. - HHRN to come 3x/week next week Wound #3 Left,Plantar Foot: Return Appointment in 2 weeks. - HHRN to come 3x/week next week Wound #5 Left,Dorsal Foot: Return Appointment in 2 weeks. - HHRN to come 3x/week next week Edema Control: Wound #1 Right,Circumferential Lower Leg: Unna Boots Bilaterally Other: - bring wraps that was shipped to house Wound #2 Left,Circumferential Lower Leg: Unna Boots  Bilaterally Other: - bring wraps that was shipped to house Wound #3 Left,Plantar Foot: Unna Boots Bilaterally Other: - bring wraps that was shipped to house Wound #5 Left,Dorsal Foot: Unna Boots Bilaterally Other: - bring wraps that was shipped to house Home Health: Wound #1 Right,Circumferential Lower Leg: Continue Home Health Visits - Please see Mr Everette 3x next week. Return to clinic in two weeks (March 27) [place] Nurse may visit PRN to address patient s wound care needs. Home Health Nurse may visit PRN to address patient s wound care needs. FACE TO FACE ENCOUNTER: MEDICARE and MEDICAID PATIENTS: I certify that this patient is under my care and that I had a face-to-face encounter that meets the physician face-to-face encounter requirements with this patient on this date. The encounter with the patient was in whole or in part for the following MEDICAL CONDITION: (primary reason for [place]) MEDICAL NECESSITY: I certify, that based on my findings, NURSING services are a medically necessary home health service. HOME BOUND STATUS: I certify that my clinical findings support that this patient is homebound (i.e., Due to illness or injury, pt requires aid of supportive devices such as crutches, cane, wheelchairs, walkers, the use of special transportation or the assistance of another person to leave their place of residence. There is a normal inability to leave the home and doing so requires considerable and taxing effort. Other absences are for medical reasons / religious services and are infrequent or of short duration when for other reasons). FACE TO FACE ENCOUNTER: MEDICARE and MEDICAID PATIENTS: I certify that this patient is under my care and that I had a face-to-face encounter that meets the physician face-to-face encounter requirements with this patient on this date. The encounter with the patient was in whole or in part for the following MEDICAL  CONDITION: (primary reason for [place]) MEDICAL NECESSITY: I certify, that based on my findings, NURSING services are a medically necessary home health service. HOME BOUND STATUS: I certify that my clinical findings support that this patient is homebound (i.e., Due to illness or injury, pt requires aid of supportive devices such as crutches, cane, wheelchairs, walkers, the use [place], [name] [name] ([id number]) of special transportation or the assistance of another person to leave their place of residence. There is a normal inability to leave the home and doing so requires considerable and taxing effort. Other absences are for medical reasons / religious services and are infrequent or of short duration when for other reasons). If current dressing causes regression in wound condition, may D/C ordered dressing product/s and apply Normal Saline Moist Dressing daily until next [place] / Other MD appointment. [place] of regression  in wound condition at [phone]. If current dressing causes regression in wound condition, may D/C ordered dressing product/s and apply Normal Saline Moist Dressing daily until next [place] / Other MD appointment. [place] of regression in wound condition at [phone]. Please direct any NON-WOUND related issues/requests for orders to patient's Primary Care Physician Please direct any NON-WOUND related issues/requests for orders to patient's Primary Care Physician Wound #2 Left,Circumferential Lower Leg: [place] Visits - Please see Mr Briar 3x next week. Return to clinic in two weeks (March 27) [place] Nurse may visit PRN to address patient s wound care needs. Home Health Nurse may visit PRN to address patient s wound care needs. FACE TO FACE ENCOUNTER: MEDICARE and MEDICAID PATIENTS: I certify that this patient is under my care and that I had a face-to-face  encounter that meets the physician face-to-face encounter requirements with this patient on this date. The encounter with the patient was in whole or in part for the following MEDICAL CONDITION: (primary reason for [place]) MEDICAL NECESSITY: I certify, that based on my findings, NURSING services are a medically necessary home health service. HOME BOUND STATUS: I certify that my clinical findings support that this patient is homebound (i.e., Due to illness or injury, pt requires aid of supportive devices such as crutches, cane, wheelchairs, walkers, the use of special transportation or the assistance of another person to leave their place of residence. There is a normal inability to leave the home and doing so requires considerable and taxing effort. Other absences are for medical reasons / religious services and are infrequent or of short duration when for other reasons). FACE TO FACE ENCOUNTER: MEDICARE and MEDICAID PATIENTS: I certify that this patient is under my care and that I had a face-to-face encounter that meets the physician face-to-face encounter requirements with this patient on this date. The encounter with the patient was in whole or in part for the following MEDICAL CONDITION: (primary reason for [place]) MEDICAL NECESSITY: I certify, that based on my findings, NURSING services are a medically necessary home health service. HOME BOUND STATUS: I certify that my clinical findings support that this patient is homebound (i.e., Due to illness or injury, pt requires aid of supportive devices such as crutches, cane, wheelchairs, walkers, the use of special transportation or the assistance of another person to leave their place of residence. There is a normal inability to leave the home and doing so requires considerable and taxing effort. Other absences are for medical reasons / religious services and are infrequent or of short duration when for other reasons). If current  dressing causes regression in wound condition, may D/C ordered dressing product/s and apply Normal Saline Moist Dressing daily until next [place] / Other MD appointment. [place] of regression in wound condition at [phone]. If current dressing causes regression in wound condition, may D/C ordered dressing product/s and apply Normal Saline Moist Dressing daily until next [place] / Other MD appointment. [place] of regression in wound condition at [phone]. Please direct any NON-WOUND related issues/requests for orders to patient's Primary Care Physician Please direct any NON-WOUND related issues/requests for orders to patient's Primary Care Physician Wound #3 Left,Plantar Foot: [place] Visits - Please see Mr Hagle 3x next week. Return to clinic in two weeks (March 27) [place] Nurse may visit PRN to address patient s wound care  needs. Home Health Nurse may visit PRN to address patient s wound care needs. FACE TO FACE ENCOUNTER: MEDICARE and MEDICAID PATIENTS: I certify that this patient is under my care and that I had a face-to-face encounter that meets the physician face-to-face encounter [name] [name], [name] [name] ([id number]) requirements with this patient on this date. The encounter with the patient was in whole or in part for the following MEDICAL CONDITION: (primary reason for [place]) MEDICAL NECESSITY: I certify, that based on my findings, NURSING services are a medically necessary home health service. HOME BOUND STATUS: I certify that my clinical findings support that this patient is homebound (i.e., Due to illness or injury, pt requires aid of supportive devices such as crutches, cane, wheelchairs, walkers, the use of special transportation or the assistance of another person to leave their place of residence. There is a normal inability to leave the home and doing so requires  considerable and taxing effort. Other absences are for medical reasons / religious services and are infrequent or of short duration when for other reasons). FACE TO FACE ENCOUNTER: MEDICARE and MEDICAID PATIENTS: I certify that this patient is under my care and that I had a face-to-face encounter that meets the physician face-to-face encounter requirements with this patient on this date. The encounter with the patient was in whole or in part for the following MEDICAL CONDITION: (primary reason for [place]) MEDICAL NECESSITY: I certify, that based on my findings, NURSING services are a medically necessary home health service. HOME BOUND STATUS: I certify that my clinical findings support that this patient is homebound (i.e., Due to illness or injury, pt requires aid of supportive devices such as crutches, cane, wheelchairs, walkers, the use of special transportation or the assistance of another person to leave their place of residence. There is a normal inability to leave the home and doing so requires considerable and taxing effort. Other absences are for medical reasons / religious services and are infrequent or of short duration when for other reasons). If current dressing causes regression in wound condition, may D/C ordered dressing product/s and apply Normal Saline Moist Dressing daily until next [place] / Other MD appointment. [place] of regression in wound condition at [phone]. If current dressing causes regression in wound condition, may D/C ordered dressing product/s and apply Normal Saline Moist Dressing daily until next [place] / Other MD appointment. [place] of regression in wound condition at [phone]. Please direct any NON-WOUND related issues/requests for orders to patient's Primary Care Physician Please direct any NON-WOUND related issues/requests for orders to patient's Primary Care Physician Wound  #5 Left,Dorsal Foot: [place] Visits - Please see Mr Slott 3x next week. Return to clinic in two weeks (March 27) [place] Nurse may visit PRN to address patient s wound care needs. Home Health Nurse may visit PRN to address patient s wound care needs. FACE TO FACE ENCOUNTER: MEDICARE and MEDICAID PATIENTS: I certify that this patient is under my care and that I had a face-to-face encounter that meets the physician face-to-face encounter requirements with this patient on this date. The encounter with the patient was in whole or in part for the following MEDICAL CONDITION: (primary reason for [place]) MEDICAL NECESSITY: I certify, that based on my findings, NURSING services are a medically necessary home health service. HOME BOUND STATUS: I certify that my clinical findings support that this patient is homebound (i.e., Due to illness  or injury, pt requires aid of supportive devices such as crutches, cane, wheelchairs, walkers, the use of special transportation or the assistance of another person to leave their place of residence. There is a normal inability to leave the home and doing so requires considerable and taxing effort. Other absences are for medical reasons / religious services and are infrequent or of short duration when for other reasons). FACE TO FACE ENCOUNTER: MEDICARE and MEDICAID PATIENTS: I certify that this patient is under my care and that I had a face-to-face encounter that meets the physician face-to-face encounter requirements with this patient on this date. The encounter with the patient was in whole or in part for the following MEDICAL CONDITION: (primary reason for [place]) MEDICAL NECESSITY: I certify, that based on my findings, NURSING services are a medically necessary home health service. HOME BOUND STATUS: I certify that my clinical findings support that this patient is homebound (i.e., Due to illness or  injury, pt requires aid of supportive devices such as crutches, cane, wheelchairs, walkers, the use of special transportation or the assistance of another person to leave their place of residence. There is a normal inability to leave the home and doing so requires considerable and taxing effort. Other absences are for medical reasons / religious services and are infrequent or of short duration when for other reasons). [name] [name], [name] [name] ([id number]) If current dressing causes regression in wound condition, may D/C ordered dressing product/s and apply Normal Saline Moist Dressing daily until next [place] / Other MD appointment. [place] of regression in wound condition at [phone]. If current dressing causes regression in wound condition, may D/C ordered dressing product/s and apply Normal Saline Moist Dressing daily until next [place] / Other MD appointment. [place] of regression in wound condition at [phone]. Please direct any NON-WOUND related issues/requests for orders to patient's Primary Care Physician Please direct any NON-WOUND related issues/requests for orders to patient's Primary Care Physician I have recommended silver alginate and Unna's boots to be applied and changed twice weekly. I have also recommended elevation and exercise and will order him juzo compression stockings with the Velcro wrap. After continuing with conservative therapy I believe he may benefit with lymphedema pumps and we will try and work on this through his insurance when the find that his drainage is becoming a little better He will come back and see as an regular basis Electronic Signature(s) Signed: 07/23/2015 5:00:58 PM By: Christin Fudge MD, FACS Previous Signature: 07/23/2015 3:14:31 PM Version By: Christin Fudge MD, FACS Entered By: Christin Fudge on 07/23/2015 17:00:58 [name] [name]  ([id number]) -------------------------------------------------------------------------------- SuperBill Details Patient Name: [name] [name] Date of Service: 07/23/2015 Medical Record Number: [id number] Patient Account Number: [id number] Date of Birth/Sex: Dec 11, 1926 (82 y.o. 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 I70.248 Atherosclerosis of native arteries of left leg with ulceration of other part of lower left leg Chronic venous hypertension (idiopathic) with ulcer and inflammation of right lower I87.331 extremity Chronic venous hypertension (idiopathic) with ulcer and inflammation of left lower I87.332 extremity Facility Procedures CPT4 Code: [id number] Description: (Facility Use Only) [id number] - APPLY UNNA BOOT LT Modifier: Quantity: 1 CPT4  Code: [id number] Description: (Facility Use Only) [phone] - APPLY Rolena Infante RT Modifier: Quantity: 1 Physician Procedures CPT4: Description Modifier Quantity Code [id number] - WC PHYS LEVEL 3 - EST PT 1 ICD-10 Description Diagnosis I89.0 Lymphedema, not elsewhere classified I70.238 Atherosclerosis of native arteries of right leg with ulceration of other part of lower  right leg I87.331 Chronic venous hypertension (idiopathic) with ulcer and inflammation of right lower extremity Electronic Signature(s) Signed: 07/23/2015 4:52:55 PM By: Christin Fudge MD, FACS Signed: 07/23/2015 5:13:11 PM By: Rebecca Eaton RN, [place] Previous Signature: 07/23/2015 3:15:03 PM Version By: Christin Fudge MD, FACS Entered By: Rebecca Eaton RN, [place] on 07/23/2015 16:50:43 [name] [name]  ([id number])

## 2015-07-24 NOTE — Progress Notes (Signed)
BRENNON, STUPP ([id number]) Visit Report for 07/23/2015 Arrival Information Details Patient Name: [name] [name], [name] [name] Date of Service: 07/23/2015 2:00 PM Medical Record Number: [id number] Patient Account Number: [id number] Date of Birth/Sex: 09-06-1926 (82 y.o. Male) Treating RN: Macarthur Critchley Primary Care Physician: Alvester Chou Other Clinician: Referring Physician: Alvester Chou Treating Physician/Extender: Frann Rider in Treatment: 15 Visit Information History Since Last Visit All ordered tests and consults were completed: No Patient Arrived: [name] Added or deleted any medications: No Arrival Time: 14:43 Any new allergies or adverse reactions: No Accompanied By: caregiver Had a fall or experienced change in No Transfer Assistance: None activities of daily living that may affect Patient Identification Verified: Yes risk of falls: Secondary Verification Process Yes Signs or symptoms of abuse/neglect since last No Completed: visito Patient Has Alerts: Yes Hospitalized since last visit: No Has Dressing in Place as Prescribed: Yes Has Compression in Place as Prescribed: Yes Pain Present Now: No Electronic Signature(s) Signed: 07/23/2015 5:13:11 PM By: Rebecca Eaton, RN, Sendra Entered By: Rebecca Eaton RN, Sendra on 07/23/2015 14:44:11 [name] [name] ([id number]) -------------------------------------------------------------------------------- Complex / Palliative Patient Assessment Details Patient Name: [name] [name] Date of Service: 07/23/2015 2:00 PM Medical Record Number: [id number] Patient Account Number: [id number] Date of Birth/Sex: [birth year] (82 y.o. Male) Treating RN: Macarthur Critchley Primary Care Physician: Alvester Chou Other Clinician: Referring Physician: Alvester Chou Treating Physician/Extender: Frann Rider in Treatment: 15 Palliative Management Criteria Complex Wound Management Criteria Patient is unable to adhere to an advanced wound management plan. o Patient Care  Contract has been implemented. Patient demonstrates continued inability to adhere to an advanced wound management plan, the patient has been presented with the option of continuing the advanced plan or implementing the Complex Wound Management Plan. o Patient or healthcare surrogate must agree to Complex Wound Management Plan. Care Approach Wound Care Plan: Complex Wound Management Notes patient not a candidate for vascular intervention as noted in scanned files from [facility], Electronic Signature(s) Signed: 07/23/2015 4:52:55 PM By: Christin Fudge MD, FACS Signed: 07/23/2015 5:13:11 PM By: Rebecca Eaton RN, Sendra Entered By: Rebecca Eaton RN, Sendra on 07/23/2015 16:47:00 [name] [name] ([id number]) -------------------------------------------------------------------------------- Encounter Discharge Information Details Patient Name: [name] [name] Date of Service: 07/23/2015 2:00 PM Medical Record Number: [id number] Patient Account Number: [id number] Date of Birth/Sex: February 21, 1927 (82 y.o. Male) Treating RN: Macarthur Critchley Primary Care Physician: Alvester Chou Other Clinician: Referring Physician: Alvester Chou Treating Physician/Extender: Frann Rider in Treatment: 15 Encounter Discharge Information Items Facility Notification Discharge Pain Level: 0 Facility Type: Home Health Discharge Condition: Stable Orders Sent: Yes Ambulatory Status: Ambulatory Discharge Destination: Home Transportation: Private Auto Schedule Follow-up Appointment: Yes Medication Reconciliation completed and provided to Patient/Care No Monserath Neff: Provided on Clinical Summary of Care: 07/23/2015 Form Type Recipient Paper Patient KL Electronic Signature(s) Signed: 07/23/2015 5:13:11 PM By: Rebecca Eaton RN, Sendra Previous Signature: 07/23/2015 3:29:00 PM Version By: Ruthine Dose Entered By: Rebecca Eaton RN, Sendra on 07/23/2015 16:51:36 [name] [name]  ([id number]) -------------------------------------------------------------------------------- Lower Extremity Assessment Details Patient Name: [name] [name] Date of Service: 07/23/2015 2:00 PM Medical Record Number: [id number] Patient Account Number: [id number] Date of Birth/Sex: [birth year] (82 y.o. Male) Treating RN: Macarthur Critchley Primary Care Physician: Alvester Chou Other Clinician: Referring Physician: Alvester Chou Treating Physician/Extender: Frann Rider in Treatment: 15 Edema Assessment Assessed: [Left: No] [Right: No] Edema: [Left: Yes] [Right: Yes] Calf Left: Right: Point of Measurement: 32 cm From Medial Instep 39.5 cm 38.2 cm Ankle Left: Right: Point of Measurement: 12 cm From Medial Instep 29.2 cm 29.2 cm Electronic Signature(s) Signed: 07/23/2015 5:13:11 PM By:  [place], RN, Roslynn Amble Entered By: Rebecca Eaton, RN, Sendra on 07/23/2015 15:00:29 [name] [name] ([id number]) -------------------------------------------------------------------------------- Pain Assessment Details Patient Name: [name] [name] Date of Service: 07/23/2015 2:00 PM Medical Record Number: [id number] Patient Account Number: [id number] Date of Birth/Sex: [birth year] (82 y.o. Male) Treating RN: Macarthur Critchley Primary Care Physician: Alvester Chou Other Clinician: Referring Physician: Alvester Chou Treating Physician/Extender: Frann Rider in Treatment: 15 Active Problems Location of Pain Severity and Description of Pain Patient Has Paino No Site Locations Rate the pain. Current Pain Level: 0 Pain Management and Medication Current Pain Management: Electronic Signature(s) Signed: 07/23/2015 5:13:11 PM By: Rebecca Eaton, RN, Sendra Entered By: Rebecca Eaton RN, Sendra on 07/23/2015 14:44:51 [name] [name] ([id number]) -------------------------------------------------------------------------------- Patient/Caregiver Education Details Patient Name: [name] [name] Date of Service: 07/23/2015 2:00 PM Medical Record  Number: [id number] Patient Account Number: [id number] Date of Birth/Gender: 1926-11-16 (82 y.o. Male) Treating RN: Macarthur Critchley Primary Care Physician: Alvester Chou Other Clinician: Referring Physician: Alvester Chou Treating Physician/Extender: Frann Rider in Treatment: 15 Education Assessment Education Provided To: Patient Education Topics Provided Wound/Skin Impairment: Handouts: Caring for Your Ulcer, Skin Care Do's and Dont's Methods: Explain/Verbal Responses: State content correctly Electronic Signature(s) Signed: 07/23/2015 5:13:11 PM By: Rebecca Eaton, RN, Sendra Entered By: Rebecca Eaton RN, Sendra on 07/23/2015 16:52:02 [name] [name] ([id number]) -------------------------------------------------------------------------------- Wound Assessment Details Patient Name: [name] [name] Date of Service: 07/23/2015 2:00 PM Medical Record Number: [id number] Patient Account Number: [id number] Date of Birth/Sex: December 28, 1926 (82 y.o. Male) Treating RN: Macarthur Critchley Primary Care Physician: Alvester Chou Other Clinician: Referring Physician: Alvester Chou Treating Physician/Extender: Frann Rider in Treatment: 15 Wound Status Wound Number: 1 Primary Lymphedema Etiology: Wound Location: Right Lower Leg - Circumfernential Wound Open Status: Wounding Event: Gradually Appeared Comorbid Congestive Heart Failure, Date Acquired: 12/28/2014 History: Hypertension, Type II Diabetes, Weeks Of Treatment: 15 Neuropathy Clustered Wound: Yes Wound Measurements Length: (cm) 9.5 Width: (cm) 14 Depth: (cm) 0.1 Area: (cm) 104.458 Volume: (cm) 10.446 % Reduction in Area: 73.1% % Reduction in Volume: 73.1% Epithelialization: Large (67-100%) Wound Description Classification: Partial Thickness Diabetic Severity Earleen Newport): Grade 0 Wound Margin: Indistinct, nonvisible Exudate Amount: Large Exudate Type: Serous Exudate Color: amber Foul Odor After Cleansing: No Wound Bed Granulation  Amount: Large (67-100%) Exposed Structure Granulation Quality: Pink, Pale Fascia Exposed: No Necrotic Amount: Small (1-33%) Fat Layer Exposed: No Necrotic Quality: Adherent Slough Tendon Exposed: No Muscle Exposed: No Joint Exposed: No Bone Exposed: No Limited to Skin Breakdown Periwound Skin Texture Texture Color No Abnormalities Noted: No No Abnormalities Noted: No Callus: No Atrophie Blanche: No Crepitus: No Cyanosis: No Borrelli, [name] ([id number]) Excoriation: No Ecchymosis: No Fluctuance: No Erythema: No Friable: No Hemosiderin Staining: No Induration: No Mottled: No Localized Edema: No Pallor: No Rash: No Rubor: No Scarring: No Temperature / Pain Moisture Temperature: No Abnormality No Abnormalities Noted: No Tenderness on Palpation: Yes Dry / Scaly: No Maceration: Yes Moist: Yes Wound Preparation Ulcer Cleansing: Other: soap AND WATER, Topical Anesthetic Applied: None Treatment Notes Wound #1 (Right, Circumferential Lower Leg) 1. Cleansed with: Cleanse wound with antibacterial soap and water 3. Peri-wound Care: Barrier cream 4. Dressing Applied: Aquacel Ag 5. Secondary Dressing Applied ABD Pad 7. Secured with Water engineer) Signed: 07/23/2015 5:13:11 PM By: Rebecca Eaton, RN, Sendra Entered By: Rebecca Eaton RN, Sendra on 07/23/2015 15:04:31 [name] [name] ([id number]) -------------------------------------------------------------------------------- Wound Assessment Details Patient Name: [name] [name] Date of Service: 07/23/2015 2:00 PM Medical Record Number: [id number] Patient Account Number: [id number] Date of Birth/Sex: 1926-07-18 (82 y.o. Male) Treating RN: Macarthur Critchley Primary Care Physician: Aris Lot,  JULIE Other Clinician: Referring Physician: Alvester Chou Treating Physician/Extender: Frann Rider in Treatment: 15 Wound Status Wound Number: 2 Primary Lymphedema Etiology: Wound Location: Left Lower Leg  - Circumfernential Wound Open Status: Wounding Event: Gradually Appeared Comorbid Congestive Heart Failure, Date Acquired: 12/28/2014 History: Hypertension, Type II Diabetes, Weeks Of Treatment: 15 Neuropathy Clustered Wound: Yes Wound Measurements Length: (cm) 13.5 Width: (cm) 28 Depth: (cm) 0.1 Area: (cm) 296.881 Volume: (cm) 29.688 % Reduction in Area: -12.5% % Reduction in Volume: -12.5% Epithelialization: Large (67-100%) Wound Description Classification: Partial Thickness Diabetic Severity (Wagner): Grade 1 Wound Margin: Indistinct, nonvisib Exudate Amount: Large Exudate Type: Serous Exudate Color: amber Foul Odor After Cleansing: Yes Due to Product Use: No le Wound Bed Granulation Amount: Large (67-100%) Exposed Structure Granulation Quality: Red, Pink, Hyper-granulation Fascia Exposed: No Necrotic Amount: Small (1-33%) Fat Layer Exposed: No Necrotic Quality: Adherent Slough Tendon Exposed: No Muscle Exposed: No Joint Exposed: No Bone Exposed: No Limited to Skin Breakdown Periwound Skin Texture Texture Color No Abnormalities Noted: No No Abnormalities Noted: No Callus: No Atrophie Blanche: No Crepitus: No Cyanosis: No Kamer, [name] ([id number]) Excoriation: No Ecchymosis: No Fluctuance: No Erythema: No Friable: No Hemosiderin Staining: No Induration: No Mottled: No Localized Edema: Yes Pallor: No Rash: No Rubor: No Scarring: No Temperature / Pain Moisture Temperature: No Abnormality No Abnormalities Noted: No Tenderness on Palpation: Yes Dry / Scaly: No Maceration: Yes Moist: Yes Wound Preparation Ulcer Cleansing: Other: soap and water, Topical Anesthetic Applied: None, Other: lidocaine 4%, Treatment Notes Wound #2 (Left, Circumferential Lower Leg) 1. Cleansed with: Cleanse wound with antibacterial soap and water 3. Peri-wound Care: Barrier cream 4. Dressing Applied: Aquacel Ag 5. Secondary Dressing Applied ABD Pad 7. Secured  with Water engineer) Signed: 07/23/2015 5:13:11 PM By: Rebecca Eaton, RN, Sendra Entered By: Rebecca Eaton RN, Sendra on 07/23/2015 15:04:44 [name] [name] ([id number]) -------------------------------------------------------------------------------- Wound Assessment Details Patient Name: [name] [name] Date of Service: 07/23/2015 2:00 PM Medical Record Number: [id number] Patient Account Number: [id number] Date of Birth/Sex: 1926-10-11 (82 y.o. Male) Treating RN: Macarthur Critchley Primary Care Physician: Alvester Chou Other Clinician: Referring Physician: Alvester Chou Treating Physician/Extender: Frann Rider in Treatment: 15 Wound Status Wound Number: 3 Primary Etiology: Lymphedema Wound Location: Left, Plantar Foot Wound Status: Open Wounding Event: Gradually Appeared Date Acquired: 01/27/2015 Weeks Of Treatment: 15 Clustered Wound: No Wound Measurements Length: (cm) 1 Width: (cm) 3 Depth: (cm) 0.1 Area: (cm) 2.356 Volume: (cm) 0.236 % Reduction in Area: 68.8% % Reduction in Volume: 68.7% Wound Description Classification: Partial Thickness Periwound Skin Texture Texture Color No Abnormalities Noted: No No Abnormalities Noted: No Moisture No Abnormalities Noted: No Treatment Notes Wound #3 (Left, Plantar Foot) 1. Cleansed with: Cleanse wound with antibacterial soap and water 3. Peri-wound Care: Barrier cream 4. Dressing Applied: Aquacel Ag 5. Secondary Dressing Applied ABD Pad 7. Secured with [employer]) JUWAUN, CORRADINO ([id number]) Signed: 07/23/2015 5:13:11 PM By: Rebecca Eaton RN, Romero Liner By: Rebecca Eaton RN, Sendra on 07/23/2015 15:02:08 CARMICHAEL, HOPPS ([id number]) -------------------------------------------------------------------------------- Wound Assessment Details Patient Name: [name] [name] Date of Service: 07/23/2015 2:00 PM Medical Record Number: [id number] Patient Account Number: [id number] Date of  Birth/Sex: [birth year] (82 y.o. Male) Treating RN: Macarthur Critchley Primary Care Physician: Alvester Chou Other Clinician: Referring Physician: Alvester Chou Treating Physician/Extender: Frann Rider in Treatment: 15 Wound Status Wound Number: 5 Primary Etiology: Lymphedema Wound Location: Left, Dorsal Foot Wound Status: Open Wounding Event: Gradually Appeared Date Acquired: 07/09/2015 Weeks Of Treatment: 2 Clustered Wound: No Wound Measurements Length: (  cm) 1 Width: (cm) 1 Depth: (cm) 0.1 Area: (cm) 0.785 Volume: (cm) 0.079 % Reduction in Area: 98.6% % Reduction in Volume: 99.9% Wound Description Classification: Partial Thickness Periwound Skin Texture Texture Color No Abnormalities Noted: No No Abnormalities Noted: No Moisture No Abnormalities Noted: No Treatment Notes Wound #5 (Left, Dorsal Foot) 1. Cleansed with: Cleanse wound with antibacterial soap and water 3. Peri-wound Care: Barrier cream 4. Dressing Applied: Aquacel Ag 5. Secondary Dressing Applied ABD Pad 7. Secured with [employer]) JORDYNN, [place] ([id number]) Signed: 07/23/2015 5:13:11 PM By: Rebecca Eaton RN, Romero Liner By: Rebecca Eaton RN, Sendra on 07/23/2015 15:02:08 [name] [name] ([id number]) -------------------------------------------------------------------------------- Vitals Details Patient Name: [name] [name] Date of Service: 07/23/2015 2:00 PM Medical Record Number: [id number] Patient Account Number: [id number] Date of Birth/Sex: Jul 06, 1926 (82 y.o. Male) Treating RN: Macarthur Critchley Primary Care Physician: Alvester Chou Other Clinician: Referring Physician: Alvester Chou Treating Physician/Extender: Frann Rider in Treatment: 15 Vital Signs Time Taken: 14:48 Temperature (F): 97.5 Height (in): 70 Pulse (bpm): 80 Weight (lbs): 230 Blood Pressure (mmHg): 112/71 Body Mass Index (BMI): 33 Reference Range: 80 - 120 mg / dl Electronic  Signature(s) Signed: 07/23/2015 5:13:11 PM By: Rebecca Eaton RN, Sendra Entered By: Rebecca Eaton RN, Sendra on 07/23/2015 14:48:13

## 2015-08-06 ENCOUNTER — Encounter: Payer: Medicare Other | Attending: Surgery | Admitting: Surgery

## 2015-08-06 DIAGNOSIS — L97811 Non-pressure chronic ulcer of other part of right lower leg limited to breakdown of skin: Secondary | ICD-10-CM | POA: Diagnosis not present

## 2015-08-06 DIAGNOSIS — N189 Chronic kidney disease, unspecified: Secondary | ICD-10-CM | POA: Diagnosis not present

## 2015-08-06 DIAGNOSIS — I87332 Chronic venous hypertension (idiopathic) with ulcer and inflammation of left lower extremity: Secondary | ICD-10-CM | POA: Diagnosis not present

## 2015-08-06 DIAGNOSIS — I87331 Chronic venous hypertension (idiopathic) with ulcer and inflammation of right lower extremity: Secondary | ICD-10-CM | POA: Insufficient documentation

## 2015-08-06 DIAGNOSIS — E11622 Type 2 diabetes mellitus with other skin ulcer: Secondary | ICD-10-CM | POA: Diagnosis not present

## 2015-08-06 DIAGNOSIS — I70238 Atherosclerosis of native arteries of right leg with ulceration of other part of lower right leg: Secondary | ICD-10-CM | POA: Diagnosis not present

## 2015-08-06 DIAGNOSIS — E1140 Type 2 diabetes mellitus with diabetic neuropathy, unspecified: Secondary | ICD-10-CM | POA: Insufficient documentation

## 2015-08-06 DIAGNOSIS — F0390 Unspecified dementia without behavioral disturbance: Secondary | ICD-10-CM | POA: Diagnosis not present

## 2015-08-06 DIAGNOSIS — I70248 Atherosclerosis of native arteries of left leg with ulceration of other part of lower left leg: Secondary | ICD-10-CM | POA: Insufficient documentation

## 2015-08-06 DIAGNOSIS — I129 Hypertensive chronic kidney disease with stage 1 through stage 4 chronic kidney disease, or unspecified chronic kidney disease: Secondary | ICD-10-CM | POA: Diagnosis not present

## 2015-08-06 DIAGNOSIS — L97821 Non-pressure chronic ulcer of other part of left lower leg limited to breakdown of skin: Secondary | ICD-10-CM | POA: Insufficient documentation

## 2015-08-06 DIAGNOSIS — I890 Lymphedema, not elsewhere classified: Secondary | ICD-10-CM | POA: Diagnosis not present

## 2015-08-06 NOTE — Progress Notes (Signed)
[name] [name], [name] [name] ([id number]) Visit Report for 08/06/2015 Arrival Information Details Patient Name: [name] [name], [name] [name] Date of Service: 08/06/2015 1:30 PM Medical Record Number: [id number] Patient Account Number: [id number] Date of Birth/Sex: 1926/07/22 (82 y.o. Male) Treating RN: Macarthur Critchley Primary Care Physician: Alvester Chou Other Clinician: Referring Physician: Alvester Chou Treating Physician/Extender: Frann Rider in Treatment: 55 Visit Information History Since Last Visit All ordered tests and consults were completed: No Patient Arrived: [name] [name] Added or deleted any medications: No Arrival Time: 13:41 Any new allergies or adverse reactions: No Accompanied By: caregiver Had a fall or experienced change in No Transfer Assistance: None activities of daily living that may affect Patient Identification Verified: Yes risk of falls: Secondary Verification Process Yes Signs or symptoms of abuse/neglect since last No Completed: visito Patient Has Alerts: Yes Hospitalized since last visit: No Has Compression in Place as Prescribed: Yes Pain Present Now: No Electronic Signature(s) Signed: 08/06/2015 4:17:15 PM By: Rebecca Eaton, RN, Sendra Entered By: Rebecca Eaton RN, Sendra on 08/06/2015 13:42:15 [name] [name] ([id number]) -------------------------------------------------------------------------------- Encounter Discharge Information Details Patient Name: [name] [name] Date of Service: 08/06/2015 1:30 PM Medical Record Number: [id number] Patient Account Number: [id number] Date of Birth/Sex: 18-Mar-1927 (82 y.o. Male) Treating RN: Macarthur Critchley Primary Care Physician: Alvester Chou Other Clinician: Referring Physician: Alvester Chou Treating Physician/Extender: Frann Rider in Treatment: 17 Encounter Discharge Information Items Facility Notification Discharge Pain Level: 0 Facility Type: Home Health Discharge Condition: Stable Orders Sent: Yes Ambulatory Status: Walker Discharge  Destination: Home Transportation: Private Auto Accompanied By: caregiver Schedule Follow-up Appointment: Yes Medication Reconciliation completed and provided to Patient/Care Yes [name] [name]: Provided on Clinical Summary of Care: 08/06/2015 Form Type Recipient Paper Patient KL Electronic Signature(s) Signed: 08/06/2015 4:17:15 PM By: Rebecca Eaton RN, Sendra Previous Signature: 08/06/2015 2:30:09 PM Version By: Ruthine Dose Entered By: Rebecca Eaton RN, Sendra on 08/06/2015 14:35:45 [name] [name] ([id number]) -------------------------------------------------------------------------------- Lower Extremity Assessment Details Patient Name: [name] [name] Date of Service: 08/06/2015 1:30 PM Medical Record Number: [id number] Patient Account Number: [id number] Date of Birth/Sex: [birth year] (82 y.o. Male) Treating RN: Macarthur Critchley Primary Care Physician: Alvester Chou Other Clinician: Referring Physician: Alvester Chou Treating Physician/Extender: Frann Rider in Treatment: 17 Edema Assessment Assessed: [Left: No] [Right: No] E[Left: dema] [Right: :] Calf Left: Right: Point of Measurement: 32 cm From Medial Instep 42.5 cm 41.8 cm Ankle Left: Right: Point of Measurement: 12 cm From Medial Instep 29.2 cm 30 cm Vascular Assessment Pulses: Posterior Tibial Dorsalis Pedis Palpable: [Left:Yes] [Right:Yes] Extremity colors, hair growth, and conditions: Extremity Color: [Left:Mottled] [Right:Mottled] Hair Growth on Extremity: [Left:No] [Right:No] Temperature of Extremity: [Left:Warm] [Right:Warm] Capillary Refill: [Left:< 3 seconds] [Right:< 3 seconds] Toe Nail Assessment Left: Right: Thick: Yes Yes Discolored: No No Deformed: No No Improper Length and Hygiene: No No Electronic Signature(s) Signed: 08/06/2015 4:17:15 PM By: Rebecca Eaton, RN, Sendra Entered By: Rebecca Eaton RN, Sendra on 08/06/2015 13:54:51 [name] [name]  ([id number]) -------------------------------------------------------------------------------- Pain Assessment Details Patient Name: [name] [name] Date of Service: 08/06/2015 1:30 PM Medical Record Number: [id number] Patient Account Number: [id number] Date of Birth/Sex: 1926/05/09 (82 y.o. Male) Treating RN: Macarthur Critchley Primary Care Physician: Alvester Chou Other Clinician: Referring Physician: Alvester Chou Treating Physician/Extender: Frann Rider in Treatment: 17 Active Problems Location of Pain Severity and Description of Pain Patient Has Paino No Site Locations Rate the pain. Current Pain Level: 0 Pain Management and Medication Current Pain Management: Electronic Signature(s) Signed: 08/06/2015 4:17:15 PM By: Rebecca Eaton, RN, Sendra Entered By: Rebecca Eaton RN, Sendra on 08/06/2015 13:42:22 [name] [name] ([id number]) -------------------------------------------------------------------------------- Patient/Caregiver Education Details Patient Name:  [name] [name] Date of Service: 08/06/2015 1:30 PM Medical Record Number: [id number] Patient Account Number: [id number] Date of Birth/Gender: October 10, 1926 (82 y.o. Male) Treating RN: Macarthur Critchley Primary Care Physician: Alvester Chou Other Clinician: Referring Physician: Alvester Chou Treating Physician/Extender: Frann Rider in Treatment: 41 Education Assessment Education Provided To: Patient Education Topics Provided Wound/Skin Impairment: Handouts: Caring for Your Ulcer, Skin Care Do's and Dont's Methods: Explain/Verbal Responses: State content correctly Electronic Signature(s) Signed: 08/06/2015 4:17:15 PM By: Rebecca Eaton, RN, Sendra Entered By: Rebecca Eaton, RN, Sendra on 08/06/2015 14:36:00 [name] [name] ([id number]) -------------------------------------------------------------------------------- Wound Assessment Details Patient Name: [name] [name] Date of Service: 08/06/2015 1:30 PM Medical Record Number: [id number] Patient Account  Number: [id number] Date of Birth/Sex: 02-25-1927 (82 y.o. Male) Treating RN: Macarthur Critchley Primary Care Physician: Alvester Chou Other Clinician: Referring Physician: Alvester Chou Treating Physician/Extender: Frann Rider in Treatment: 17 Wound Status Wound Number: 1 Primary Lymphedema Etiology: Wound Location: Right Lower Leg - Circumfernential Wound Open Status: Wounding Event: Gradually Appeared Comorbid Congestive Heart Failure, Date Acquired: 12/28/2014 History: Hypertension, Type II Diabetes, Weeks Of Treatment: 17 Neuropathy Clustered Wound: Yes Photos Photo Uploaded By: Rebecca Eaton, RN, Roslynn Amble on 08/06/2015 15:33:16 Wound Measurements Length: (cm) 10 Width: (cm) 17 Depth: (cm) 0.1 Area: (cm) 133.518 Volume: (cm) 13.352 % Reduction in Area: 65.6% % Reduction in Volume: 65.6% Epithelialization: Large (67-100%) Tunneling: No Undermining: No Wound Description Classification: Partial Thickness Diabetic Severity [name] [name]): Grade 0 Wound Margin: Indistinct, nonvisible Exudate Amount: Large Exudate Type: Serous Exudate Color: amber Foul Odor After Cleansing: No Wound Bed Granulation Amount: Large (67-100%) Exposed Structure Granulation Quality: Pink, Pale Fascia Exposed: No [name] [name], [name] [name] ([id number]) Necrotic Amount: Small (1-33%) Fat Layer Exposed: No Necrotic Quality: Adherent Slough Tendon Exposed: No Muscle Exposed: No Joint Exposed: No Bone Exposed: No Limited to Skin Breakdown Periwound Skin Texture Texture Color No Abnormalities Noted: No No Abnormalities Noted: No Callus: No Atrophie Blanche: No Crepitus: No Cyanosis: No Excoriation: No Ecchymosis: No Fluctuance: No Erythema: No Friable: No Hemosiderin Staining: No Induration: No Mottled: No Localized Edema: No Pallor: No Rash: No Rubor: No Scarring: No Temperature / Pain Moisture Temperature: No Abnormality No Abnormalities Noted: No Tenderness on Palpation: Yes Dry / Scaly:  No Maceration: Yes Moist: Yes Wound Preparation Ulcer Cleansing: Other: soap AND WATER, Topical Anesthetic Applied: None Treatment Notes Wound #1 (Right, Circumferential Lower Leg) 1. Cleansed with: Cleanse wound with antibacterial soap and water 4. Dressing Applied: Aquacel Ag 5. Secondary Dressing Applied ABD Pad 7. Secured with Water engineer) Signed: 08/06/2015 4:17:15 PM By: Rebecca Eaton, RN, Sendra Entered By: Rebecca Eaton RN, Sendra on 08/06/2015 13:57:22 [name] [name] ([id number]) -------------------------------------------------------------------------------- Wound Assessment Details Patient Name: [name] [name] Date of Service: 08/06/2015 1:30 PM Medical Record Number: [id number] Patient Account Number: [id number] Date of Birth/Sex: 04-27-1927 (82 y.o. Male) Treating RN: Macarthur Critchley Primary Care Physician: Alvester Chou Other Clinician: Referring Physician: Alvester Chou Treating Physician/Extender: Frann Rider in Treatment: 17 Wound Status Wound Number: 2 Primary Lymphedema Etiology: Wound Location: Left Lower Leg - Circumfernential Wound Open Status: Wounding Event: Gradually Appeared Comorbid Congestive Heart Failure, Date Acquired: 12/28/2014 History: Hypertension, Type II Diabetes, Weeks Of Treatment: 17 Neuropathy Clustered Wound: Yes Photos Photo Uploaded By: Rebecca Eaton, RN, Roslynn Amble on 08/06/2015 15:34:17 Wound Measurements Length: (cm) 19 Width: (cm) 27 Depth: (cm) 0.1 Area: (cm) 402.909 Volume: (cm) 40.291 % Reduction in Area: -52.7% % Reduction in Volume: -52.7% Epithelialization: Large (67-100%) Tunneling: No Undermining: No Wound Description Classification: Partial Thickness Diabetic Severity [name] [name]): Grade 1 Wound Margin: Indistinct,  nonvisibl Exudate Amount: Large Exudate Type: Serous Exudate Color: amber Foul Odor After Cleansing: Yes Due to Product Use: No e Wound Bed Granulation Amount: Large  (67-100%) Exposed Structure Granulation Quality: Red, Pink, Hyper-granulation Fascia Exposed: No [name] [name], [name] [name] ([id number]) Necrotic Amount: Small (1-33%) Fat Layer Exposed: No Necrotic Quality: Adherent Slough Tendon Exposed: No Muscle Exposed: No Joint Exposed: No Bone Exposed: No Limited to Skin Breakdown Periwound Skin Texture Texture Color No Abnormalities Noted: No No Abnormalities Noted: No Callus: No Atrophie Blanche: No Crepitus: No Cyanosis: No Excoriation: No Ecchymosis: No Fluctuance: No Erythema: No Friable: No Hemosiderin Staining: No Induration: No Mottled: No Localized Edema: Yes Pallor: No Rash: No Rubor: No Scarring: No Temperature / Pain Moisture Temperature: No Abnormality No Abnormalities Noted: No Tenderness on Palpation: Yes Dry / Scaly: No Maceration: Yes Moist: Yes Wound Preparation Ulcer Cleansing: Other: soap and water, Topical Anesthetic Applied: None, Other: lidocaine 4%, Treatment Notes Wound #2 (Left, Circumferential Lower Leg) 1. Cleansed with: Cleanse wound with antibacterial soap and water 4. Dressing Applied: Aquacel Ag 5. Secondary Dressing Applied ABD Pad 7. Secured with Water engineer) Signed: 08/06/2015 4:17:15 PM By: Rebecca Eaton, RN, Sendra Entered By: Rebecca Eaton, RN, Sendra on 08/06/2015 13:57:35 [name] [name] ([id number]) -------------------------------------------------------------------------------- Wound Assessment Details Patient Name: [name] [name] Date of Service: 08/06/2015 1:30 PM Medical Record Number: [id number] Patient Account Number: [id number] Date of Birth/Sex: 26-Apr-1927 (82 y.o. Male) Treating RN: Macarthur Critchley Primary Care Physician: Alvester Chou Other Clinician: Referring Physician: Alvester Chou Treating Physician/Extender: Frann Rider in Treatment: 17 Wound Status Wound Number: 3 Primary Lymphedema Etiology: Wound Location: Left Foot - Plantar Wound  Open Wounding Event: Gradually Appeared Status: Date Acquired: 01/27/2015 Comorbid Congestive Heart Failure, Weeks Of Treatment: 17 History: Hypertension, Type II Diabetes, Clustered Wound: No Neuropathy Photos Photo Uploaded By: Rebecca Eaton, RN, Roslynn Amble on 08/06/2015 15:35:12 Wound Measurements Length: (cm) 0.5 Width: (cm) 1.5 Depth: (cm) 0.1 Area: (cm) 0.589 Volume: (cm) 0.059 % Reduction in Area: 92.2% % Reduction in Volume: 92.2% Epithelialization: None Tunneling: No Undermining: No Wound Description Classification: Partial Thickness Diabetic Severity [name] [name]): Grade 1 Wound Margin: Flat and Intact Wound Bed Granulation Amount: Large (67-100%) Granulation Quality: Pink, Pale Necrotic Amount: None Present (0%) Periwound Skin Texture Texture Color [name] [name], [name] [name] ([id number]) No Abnormalities Noted: No No Abnormalities Noted: No Moisture No Abnormalities Noted: No Wound Preparation Ulcer Cleansing: Other: soap and water, Topical Anesthetic Applied: None Treatment Notes Wound #3 (Left, Plantar Foot) 1. Cleansed with: Cleanse wound with antibacterial soap and water 4. Dressing Applied: Aquacel Ag 5. Secondary Dressing Applied ABD Pad 7. Secured with Water engineer) Signed: 08/06/2015 4:17:15 PM By: Rebecca Eaton, RN, Sendra Entered By: Rebecca Eaton, RN, Sendra on 08/06/2015 13:58:14 [name] [name] ([id number]) -------------------------------------------------------------------------------- Wound Assessment Details Patient Name: [name] [name] Date of Service: 08/06/2015 1:30 PM Medical Record Number: [id number] Patient Account Number: [id number] Date of Birth/Sex: 10-09-1926 (82 y.o. Male) Treating RN: Macarthur Critchley Primary Care Physician: Alvester Chou Other Clinician: Referring Physician: Alvester Chou Treating Physician/Extender: Frann Rider in Treatment: 17 Wound Status Wound Number: 5 Primary Lymphedema Etiology: Wound Location:  Left Foot - Dorsal Wound Open Wounding Event: Gradually Appeared Status: Date Acquired: 07/09/2015 Comorbid Congestive Heart Failure, Weeks Of Treatment: 4 History: Hypertension, Type II Diabetes, Clustered Wound: No Neuropathy Wound Measurements Length: (cm) 0.4 Width: (cm) 0.2 Depth: (cm) 0.1 Area: (cm) 0.063 Volume: (cm) 0.006 % Reduction in Area: 99.9% % Reduction in Volume: 100% Epithelialization: None Tunneling: No Undermining: No Wound Description Classification: Partial Thickness  Foul Odor Diabetic Severity [name] [name]): Grade 1 Wound Margin: Flat and Intact Exudate Amount: Small Exudate Type: Serous Exudate Color: amber After Cleansing: No Wound Bed Granulation Amount: Large (67-100%) Granulation Quality: Pink Necrotic Amount: None Present (0%) Periwound Skin Texture Texture Color No Abnormalities Noted: No No Abnormalities Noted: No Moisture No Abnormalities Noted: No Moist: Yes Wound Preparation Ulcer Cleansing: Other: soap and water, Topical Anesthetic Applied: None [name] [name], [name] [name] ([id number]) Treatment Notes Wound #5 (Left, Dorsal Foot) 1. Cleansed with: Cleanse wound with antibacterial soap and water 4. Dressing Applied: Aquacel Ag 5. Secondary Dressing Applied ABD Pad 7. Secured with Water engineer) Signed: 08/06/2015 4:17:15 PM By: Rebecca Eaton, RN, Sendra Entered By: Rebecca Eaton RN, Sendra on 08/06/2015 13:58:53 [name] [name] ([id number]) -------------------------------------------------------------------------------- [place] Details Patient Name: [name] [name] Date of Service: 08/06/2015 1:30 PM Medical Record Number: [id number] Patient Account Number: [id number] Date of Birth/Sex: [birth year] (82 y.o. Male) Treating RN: Macarthur Critchley Primary Care Physician: Alvester Chou Other Clinician: Referring Physician: Alvester Chou Treating Physician/Extender: Frann Rider in Treatment: 17 Vital Signs Time Taken:  13:50 Temperature (F): 97.6 Height (in): 70 Pulse (bpm): 72 Weight (lbs): 230 Blood Pressure (mmHg): 150/73 Body Mass Index (BMI): 33 Reference Range: 80 - 120 mg / dl Electronic Signature(s) Signed: 08/06/2015 4:17:15 PM By: Rebecca Eaton RN, Sendra Entered By: Rebecca Eaton RN, Sendra on 08/06/2015 14:34:47

## 2015-08-06 NOTE — Progress Notes (Addendum)
[name] [name], [name] [name] ([id number]) Visit Report for 08/06/2015 Chief Complaint Document Details Patient Name: [name] [name], [name] [name] Date of Service: 08/06/2015 1:30 PM Medical Record Number: [id number] Patient Account Number: [id number] Date of Birth/Sex: 1927-03-05 (82 y.o. Male) Treating RN: Macarthur Critchley Primary Care Physician: Alvester Chou Other Clinician: Referring Physician: Alvester Chou Treating Physician/Extender: Frann Rider in Treatment: 17 Information Obtained from: Patient Chief Complaint Patient presents to the wound care center for a consult due non healing wound both lower extremities. The patient is a poor historian and was seen here once in July 2016. He says home health was doing his dressings and he was healed but now has recurrence of the problem. Problem has been there at least for 6 months. Electronic Signature(s) Signed: 08/06/2015 2:11:45 PM By: Christin Fudge MD, FACS Entered By: Christin Fudge on 08/06/2015 14:11:45 [name] [name] ([id number]) -------------------------------------------------------------------------------- HPI Details Patient Name: [name] [name] Date of Service: 08/06/2015 1:30 PM Medical Record Number: [id number] Patient Account Number: [id number] Date of Birth/Sex: 18-Nov-1926 (82 y.o. Male) Treating RN: Macarthur Critchley Primary Care Physician: Alvester Chou Other Clinician: Referring Physician: Alvester Chou Treating Physician/Extender: Frann Rider in Treatment: 17 History of Present Illness Location: swelling of the legs right more than left an ulceration both lower extremities Quality: Patient reports experiencing a dull pain to affected area(s). Severity: Patient states wound (s) are getting worse Duration: Patient has had the wound for > 6 months prior to seeking treatment at the wound center Timing: Pain in wound is Intermittent. Context: The wound appeared gradually over time Modifying Factors: he has been having home health come and apply his  dressing daily was healed. Associated Signs and Symptoms: Patient reports having difficulty standing for long periods. HPI Description: [name] [name] is a 82 y.o. male with history of DM, hypertension, chronic kidney disease, gout, diastolic dysfunction and the patient was having increasing swelling with increasing fluid draining from the legs and pain over the last 6 weeks. He says his wounds are completely healed and the home health had stopped coming to do his dressings. Recent hemoglobin A1c was 6.6. During the last admission A venous duplex study revealed no evidence of deep vein thrombosis or superficial thrombosis involving the right and left lower extremity. Was seen in January 2016 by Dr. Adele Barthel at [facility]: Non-Invasive Vascular Imaging ABI (05/05/2014) o R: 0.65 (0.73), DP: mono, PT: mono, TBI: 0.57 o L: 0.65 (0.69), DP: mono, PT: mono, TBI: 0.36 The patient had the diagnosis of bilateral lower extremity moderate peripheral atherosclerotic disease, bilateral lower extremity chronic venous insufficiency. Due to the patient's age and mental status conservative therapy was only discussed and no surgical intervention was planned. The patient has significant dementia and has a caregiver who spends a few hours every day coming to see him. He does not have any family members. 05/21/2015 -- lower extremity arterial studies done on 05/17/2015 show the right ABI is 0.50-0.79 indicating moderate arterial occlusive disease at rest. The left ABI was 0.5-0.79 indicating moderate arterial occlusive disease at rest. The left posterior tibial and peroneal arteries were not evaluated due to local wound condition. The right TBI was less than 0.69 and the left ABI was less than 0.69 which are both abnormal. Since it's been a year since the previous opinion from Dr. Bridgett Larsson I have asked him to see him for a review. 05/28/2015 -- was seen by Dr. Bridgett Larsson and he recommended that he was not a good candidate for  surgery and surgical revascularization if the need develops. He  recommends to continue with compressive therapy and wound care. 06/04/2015 -- had asked him to remove his compression wraps have a shower and come to see as but he has probably been scratching his legs and caused a lot of excoriation and has multiple open ulcers which are much more worse than before. The patient is not very compliant and may be this is because of his [name] [name], [name] [name] ([id number]) dementia. 08/06/2015 -- his hygiene continues to be very bad and he has a lot of drainage from his wounds and is not happy about his home health nurses and would like to change them. Electronic Signature(s) Signed: 08/06/2015 2:13:05 PM By: Christin Fudge MD, FACS Entered By: Christin Fudge on 08/06/2015 14:13:05 [name] [name] ([id number]) -------------------------------------------------------------------------------- Physical Exam Details Patient Name: [name] [name] Date of Service: 08/06/2015 1:30 PM Medical Record Number: [id number] Patient Account Number: [id number] Date of Birth/Sex: [birth year] (82 y.o. Male) Treating RN: Macarthur Critchley Primary Care Physician: Alvester Chou Other Clinician: Referring Physician: Alvester Chou Treating Physician/Extender: Frann Rider in Treatment: 17 Constitutional . Pulse regular. Respirations normal and unlabored. Afebrile. . Eyes Nonicteric. Reactive to light. Ears, Nose, Mouth, and Throat Lips, teeth, and gums WNL.Marland Kitchen Moist mucosa without lesions. Neck supple and nontender. No palpable supraclavicular or cervical adenopathy. Normal sized without goiter. Respiratory WNL. No retractions.. Breath sounds WNL, No rubs, rales, rhonchi, or wheeze.. Cardiovascular Heart rhythm and rate regular, no murmur or gallop.. Pedal Pulses WNL. No clubbing, cyanosis or edema. Chest Breasts symmetical and no nipple discharge.. Breast tissue WNL, no masses, lumps, or tenderness.. Lymphatic No adneopathy. No  adenopathy. No adenopathy. Musculoskeletal Adexa without tenderness or enlargement.. Digits and nails w/o clubbing, cyanosis, infection, petechiae, ischemia, or inflammatory conditions.. Integumentary (Hair, Skin) No suspicious lesions. No crepitus or fluctuance. No peri-wound warmth or erythema. No masses.Marland Kitchen Psychiatric Judgement and insight Intact.. No evidence of depression, anxiety, or agitation.. Notes he has a lot of ulceration in the lower third of both right and left lower extremities with stage II lymphedema. Hygiene continues to be very poor and we will do our best with palliative care and wound Unna's boots. Electronic Signature(s) Signed: 08/06/2015 2:13:40 PM By: Christin Fudge MD, FACS Entered By: Christin Fudge on 08/06/2015 14:13:40 [name] [name] ([id number]) -------------------------------------------------------------------------------- Physician Orders Details Patient Name: [name] [name] Date of Service: 08/06/2015 1:30 PM Medical Record Number: [id number] Patient Account Number: [id number] Date of Birth/Sex: Sep 26, 1926 (82 y.o. Male) Treating RN: Macarthur Critchley Primary Care Physician: Alvester Chou Other Clinician: Referring Physician: Alvester Chou Treating Physician/Extender: Frann Rider in Treatment: 51 Verbal / Phone Orders: Yes Clinician: Macarthur Critchley Read Back and Verified: Yes Diagnosis Coding Wound Cleansing Wound #1 Right,Circumferential Lower Leg o May shower with protection. Wound #2 Left,Circumferential Lower Leg o May shower with protection. Wound #3 Left,Plantar Foot o May shower with protection. Wound #5 Left,Dorsal Foot o May shower with protection. Primary Wound Dressing Wound #1 Right,Circumferential Lower Leg o Aquacel Ag Wound #2 Left,Circumferential Lower Leg o Aquacel Ag Wound #3 Left,Plantar Foot o Aquacel Ag Wound #5 Left,Dorsal Foot o Aquacel Ag Secondary Dressing Wound #1 Right,Circumferential Lower  Leg o ABD pad o Dry Gauze o Drawtex Wound #2 Left,Circumferential Lower Leg o ABD pad o Dry Gauze o Drawtex Fencl, Geoffry ([id number]) Wound #3 Left,Plantar Foot o ABD pad o Dry Gauze o Drawtex Wound #5 Left,Dorsal Foot o ABD pad o Dry Gauze o Drawtex Dressing Change Frequency Wound #1 Right,Circumferential Lower Leg o Three times weekly Wound #2 Left,Circumferential Lower Leg o Three times  weekly Wound #3 Left,Plantar Foot o Three times weekly Wound #5 Left,Dorsal Foot o Three times weekly Follow-up Appointments Wound #1 Right,Circumferential Lower Leg o Return Appointment in 2 weeks. - HHRN to come 3x/week next week Wound #2 Left,Circumferential Lower Leg o Return Appointment in 2 weeks. - HHRN to come 3x/week next week Wound #3 Left,Plantar Foot o Return Appointment in 2 weeks. - HHRN to come 3x/week next week Wound #5 Left,Dorsal Foot o Return Appointment in 2 weeks. - HHRN to come 3x/week next week Edema Control Wound #1 Right,Circumferential Lower Leg o Unna Boots Bilaterally o Other: - bring wraps that was shipped to house Wound #2 Left,Circumferential Lower Leg o Unna Boots Bilaterally o Other: - bring wraps that was shipped to house Wound #3 Left,Plantar Foot [address] [name] [name], [name] [name] ([id number]) o Other: - bring wraps that was shipped to house Wound #5 Left,Dorsal Foot o Unna Boots Bilaterally o Other: - bring wraps that was shipped to [place] #1 [place] Visits - Please see Mr Mccommon 3x next week. Return to clinic in two weeks (April 24) o [place] Nurse may visit PRN to address patientos wound care needs. o Home Health Nurse may visit PRN to address patientos wound care needs. o FACE TO FACE ENCOUNTER: MEDICARE and MEDICAID PATIENTS: I certify that this patient is under my care and  that I had a face-to-face encounter that meets the physician face-to-face encounter requirements with this patient on this date. The encounter with the patient was in whole or in part for the following MEDICAL CONDITION: (primary reason for [place]) MEDICAL NECESSITY: I certify, that based on my findings, NURSING services are a medically necessary home health service. HOME BOUND STATUS: I certify that my clinical findings support that this patient is homebound (i.e., Due to illness or injury, pt requires aid of supportive devices such as crutches, cane, wheelchairs, walkers, the use of special transportation or the assistance of another person to leave their place of residence. There is a normal inability to leave the home and doing so requires considerable and taxing effort. Other absences are for medical reasons / religious services and are infrequent or of short duration when for other reasons). o FACE TO FACE ENCOUNTER: MEDICARE and MEDICAID PATIENTS: I certify that this patient is under my care and that I had a face-to-face encounter that meets the physician face-to-face encounter requirements with this patient on this date. The encounter with the patient was in whole or in part for the following MEDICAL CONDITION: (primary reason for [place]) MEDICAL NECESSITY: I certify, that based on my findings, NURSING services are a medically necessary home health service. HOME BOUND STATUS: I certify that my clinical findings support that this patient is homebound (i.e., Due to illness or injury, pt requires aid of supportive devices such as crutches, cane, wheelchairs, walkers, the use of special transportation or the assistance of another person to leave their place of residence. There is a normal inability to leave the home and doing so requires considerable and taxing effort. Other absences are for medical reasons / religious services and are infrequent or of short  duration when for other reasons). o If current dressing causes regression in wound condition, may D/C ordered dressing product/s and apply Normal Saline Moist Dressing daily until next [place] / Other MD appointment. [place] of regression in wound condition at [phone]. o If  current dressing causes regression in wound condition, may D/C ordered dressing product/s and apply Normal Saline Moist Dressing daily until next [place] / Other MD appointment. [place] of regression in wound condition at [phone]. o Please direct any NON-WOUND related issues/requests for orders to patient's Primary Care Physician o Please direct any NON-WOUND related issues/requests for orders to patient's Primary Care Physician Wound #2 Left,Circumferential Lower Leg [name] [name], [name] [name] ([id number]) o [place] Visits - Please see Mr Lueken 3x next week. Return to clinic in two weeks (April 24) o [place] Nurse may visit PRN to address patientos wound care needs. o Home Health Nurse may visit PRN to address patientos wound care needs. o FACE TO FACE ENCOUNTER: MEDICARE and MEDICAID PATIENTS: I certify that this patient is under my care and that I had a face-to-face encounter that meets the physician face-to-face encounter requirements with this patient on this date. The encounter with the patient was in whole or in part for the following MEDICAL CONDITION: (primary reason for [place]) MEDICAL NECESSITY: I certify, that based on my findings, NURSING services are a medically necessary home health service. HOME BOUND STATUS: I certify that my clinical findings support that this patient is homebound (i.e., Due to illness or injury, pt requires aid of supportive devices such as crutches, cane, wheelchairs, walkers, the use of special transportation or the assistance of another person to  leave their place of residence. There is a normal inability to leave the home and doing so requires considerable and taxing effort. Other absences are for medical reasons / religious services and are infrequent or of short duration when for other reasons). o FACE TO FACE ENCOUNTER: MEDICARE and MEDICAID PATIENTS: I certify that this patient is under my care and that I had a face-to-face encounter that meets the physician face-to-face encounter requirements with this patient on this date. The encounter with the patient was in whole or in part for the following MEDICAL CONDITION: (primary reason for [place]) MEDICAL NECESSITY: I certify, that based on my findings, NURSING services are a medically necessary home health service. HOME BOUND STATUS: I certify that my clinical findings support that this patient is homebound (i.e., Due to illness or injury, pt requires aid of supportive devices such as crutches, cane, wheelchairs, walkers, the use of special transportation or the assistance of another person to leave their place of residence. There is a normal inability to leave the home and doing so requires considerable and taxing effort. Other absences are for medical reasons / religious services and are infrequent or of short duration when for other reasons). o If current dressing causes regression in wound condition, may D/C ordered dressing product/s and apply Normal Saline Moist Dressing daily until next [place] / Other MD appointment. [place] of regression in wound condition at [phone]. o If current dressing causes regression in wound condition, may D/C ordered dressing product/s and apply Normal Saline Moist Dressing daily until next [place] / Other MD appointment. [place] of regression in wound condition at [phone]. o Please direct any NON-WOUND related issues/requests for orders to patient's Primary  Care Physician o Please direct any NON-WOUND related issues/requests for orders to patient's Primary Care Physician Wound #3 [place] Visits - Please see Mr Luikart 3x next week. Return to clinic in two weeks (April 24) o [place]  Nurse may visit PRN to address patientos wound care needs. o Home Health Nurse may visit PRN to address patientos wound care needs. o FACE TO FACE ENCOUNTER: MEDICARE and MEDICAID PATIENTS: I certify that this patient is under my care and that I had a face-to-face encounter that meets the physician face-to-face encounter requirements with this patient on this date. The encounter with the patient was in whole or in part for the following MEDICAL CONDITION: (primary reason for [place]) [name] [name], [name] [name] ([id number]) MEDICAL NECESSITY: I certify, that based on my findings, NURSING services are a medically necessary home health service. HOME BOUND STATUS: I certify that my clinical findings support that this patient is homebound (i.e., Due to illness or injury, pt requires aid of supportive devices such as crutches, cane, wheelchairs, walkers, the use of special transportation or the assistance of another person to leave their place of residence. There is a normal inability to leave the home and doing so requires considerable and taxing effort. Other absences are for medical reasons / religious services and are infrequent or of short duration when for other reasons). o FACE TO FACE ENCOUNTER: MEDICARE and MEDICAID PATIENTS: I certify that this patient is under my care and that I had a face-to-face encounter that meets the physician face-to-face encounter requirements with this patient on this date. The encounter with the patient was in whole or in part for the following MEDICAL CONDITION: (primary reason for [place]) MEDICAL NECESSITY: I certify, that based on my findings, NURSING  services are a medically necessary home health service. HOME BOUND STATUS: I certify that my clinical findings support that this patient is homebound (i.e., Due to illness or injury, pt requires aid of supportive devices such as crutches, cane, wheelchairs, walkers, the use of special transportation or the assistance of another person to leave their place of residence. There is a normal inability to leave the home and doing so requires considerable and taxing effort. Other absences are for medical reasons / religious services and are infrequent or of short duration when for other reasons). o If current dressing causes regression in wound condition, may D/C ordered dressing product/s and apply Normal Saline Moist Dressing daily until next [place] / Other MD appointment. [place] of regression in wound condition at [phone]. o If current dressing causes regression in wound condition, may D/C ordered dressing product/s and apply Normal Saline Moist Dressing daily until next [place] / Other MD appointment. [place] of regression in wound condition at [phone]. o Please direct any NON-WOUND related issues/requests for orders to patient's Primary Care Physician o Please direct any NON-WOUND related issues/requests for orders to patient's Primary Care Physician Wound #5 [place] Visits - Please see Mr Gai 3x next week. Return to clinic in two weeks (April 24) o [place] Nurse may visit PRN to address patientos wound care needs. o Home Health Nurse may visit PRN to address patientos wound care needs. o FACE TO FACE ENCOUNTER: MEDICARE and MEDICAID PATIENTS: I certify that this patient is under my care and that I had a face-to-face encounter that meets the physician face-to-face encounter requirements with this patient on this date. The encounter  with the patient was in whole or in part for the following MEDICAL CONDITION: (primary reason for [place]) MEDICAL NECESSITY: I certify, that based on my findings, NURSING services are a medically necessary home health service. HOME  BOUND STATUS: I certify that my clinical findings support that this patient is homebound (i.e., Due to illness or injury, pt requires aid of supportive devices such as crutches, cane, wheelchairs, walkers, the use of special transportation or the assistance of another person to leave their place of residence. There is a normal inability to leave the home and doing so requires considerable and taxing effort. Other absences are for medical reasons / religious services and are infrequent or of short duration when for other reasons). [name] [name], [name] [name] ([id number]) o FACE TO FACE ENCOUNTER: MEDICARE and MEDICAID PATIENTS: I certify that this patient is under my care and that I had a face-to-face encounter that meets the physician face-to-face encounter requirements with this patient on this date. The encounter with the patient was in whole or in part for the following MEDICAL CONDITION: (primary reason for [place]) MEDICAL NECESSITY: I certify, that based on my findings, NURSING services are a medically necessary home health service. HOME BOUND STATUS: I certify that my clinical findings support that this patient is homebound (i.e., Due to illness or injury, pt requires aid of supportive devices such as crutches, cane, wheelchairs, walkers, the use of special transportation or the assistance of another person to leave their place of residence. There is a normal inability to leave the home and doing so requires considerable and taxing effort. Other absences are for medical reasons / religious services and are infrequent or of short duration when for other reasons). o If current dressing causes regression in wound condition, may D/C ordered dressing  product/s and apply Normal Saline Moist Dressing daily until next [place] / Other MD appointment. [place] of regression in wound condition at [phone]. o If current dressing causes regression in wound condition, may D/C ordered dressing product/s and apply Normal Saline Moist Dressing daily until next [place] / Other MD appointment. [place] of regression in wound condition at [phone]. o Please direct any NON-WOUND related issues/requests for orders to patient's Primary Care Physician o Please direct any NON-WOUND related issues/requests for orders to patient's Primary Care Physician Electronic Signature(s) Signed: 08/06/2015 3:21:43 PM By: Christin Fudge MD, FACS Signed: 08/06/2015 4:17:15 PM By: Rebecca Eaton RN, [place] Entered By: Rebecca Eaton RN, [place] on 08/06/2015 14:29:12 [name] [name] ([id number]) -------------------------------------------------------------------------------- Problem List Details Patient Name: [name] [name] Date of Service: 08/06/2015 1:30 PM Medical Record Number: [id number] Patient Account Number: [id number] Date of Birth/Sex: 05-15-1926 (82 y.o. Male) Treating RN: Macarthur Critchley Primary Care Physician: Alvester Chou Other Clinician: Referring Physician: Alvester Chou Treating Physician/Extender: Frann Rider in Treatment: 17 Active Problems ICD-10 Encounter Code Description Active Date Diagnosis E11.622 Type 2 diabetes mellitus with other skin ulcer 04/09/2015 Yes I89.0 Lymphedema, not elsewhere classified 04/09/2015 Yes I70.238 Atherosclerosis of native arteries of right leg with 04/09/2015 Yes ulceration of other part of lower right leg I70.248 Atherosclerosis of native arteries of left leg with ulceration 04/09/2015 Yes of other part of lower left leg I87.331 Chronic venous hypertension (idiopathic) with ulcer and 04/09/2015 Yes inflammation of right lower extremity I87.332  Chronic venous hypertension (idiopathic) with ulcer and 04/09/2015 Yes inflammation of left lower extremity Inactive Problems Resolved Problems Electronic Signature(s) Signed: 08/06/2015 2:11:25 PM By: Christin Fudge MD, FACS Entered By: Christin Fudge on 08/06/2015 14:11:24 [name] [name] ([id number]) -------------------------------------------------------------------------------- Progress Note Details Patient Name: [name] [name] Date of Service: 08/06/2015 1:30 PM Medical Record Number: [id number] Patient Account Number: [id number] Date of Birth/Sex: 1927/04/21 (82 y.o. Male) Treating RN: Macarthur Critchley  Primary Care Physician: Alvester Chou Other Clinician: Referring Physician: Alvester Chou Treating Physician/Extender: Frann Rider in Treatment: 17 Subjective Chief Complaint Information obtained from Patient Patient presents to the wound care center for a consult due non healing wound both lower extremities. The patient is a poor historian and was seen here once in July 2016. He says home health was doing his dressings and he was healed but now has recurrence of the problem. Problem has been there at least for 6 months. History of Present Illness (HPI) The following HPI elements were documented for the patient's wound: Location: swelling of the legs right more than left an ulceration both lower extremities Quality: Patient reports experiencing a dull pain to affected area(s). Severity: Patient states wound (s) are getting worse Duration: Patient has had the wound for > 6 months prior to seeking treatment at the wound center Timing: Pain in wound is Intermittent. Context: The wound appeared gradually over time Modifying Factors: he has been having home health come and apply his dressing daily was healed. Associated Signs and Symptoms: Patient reports having difficulty standing for long periods. Newell Olander is a 82 y.o. male with history of DM, hypertension, chronic kidney disease,  gout, diastolic dysfunction and the patient was having increasing swelling with increasing fluid draining from the legs and pain over the last 6 weeks. He says his wounds are completely healed and the home health had stopped coming to do his dressings. Recent hemoglobin A1c was 6.6. During the last admission A venous duplex study revealed no evidence of deep vein thrombosis or superficial thrombosis involving the right and left lower extremity. Was seen in January 2016 by Dr. Adele Barthel at [facility]: Non-Invasive Vascular Imaging ABI (05/05/2014)  R: 0.65 (0.73), DP: mono, PT: mono, TBI: 0.57  L: 0.65 (0.69), DP: mono, PT: mono, TBI: 0.36 The patient had the diagnosis of bilateral lower extremity moderate peripheral atherosclerotic disease, bilateral lower extremity chronic venous insufficiency. Due to the patient's age and mental status conservative therapy was only discussed and no surgical intervention was planned. The patient has significant dementia and has a caregiver who spends a few hours every day coming to see him. He does not have any family members. 05/21/2015 -- lower extremity arterial studies done on 05/17/2015 show the right ABI is 0.50-0.79 indicating moderate arterial occlusive disease at rest. The left ABI was 0.5-0.79 indicating moderate arterial occlusive disease at rest. The left posterior tibial and peroneal arteries were not evaluated due to local wound [name] [name], [name] [name] ([id number]) condition. The right TBI was less than 0.69 and the left ABI was less than 0.69 which are both abnormal. Since it's been a year since the previous opinion from Dr. Bridgett Larsson I have asked him to see him for a review. 05/28/2015 -- was seen by Dr. Bridgett Larsson and he recommended that he was not a good candidate for surgery and surgical revascularization if the need develops. He recommends to continue with compressive therapy and wound care. 06/04/2015 -- had asked him to remove his compression wraps have a  shower and come to see as but he has probably been scratching his legs and caused a lot of excoriation and has multiple open ulcers which are much more worse than before. The patient is not very compliant and may be this is because of his dementia. 08/06/2015 -- his hygiene continues to be very bad and he has a lot of drainage from his wounds and is not happy about his home health nurses and would like  to change them. Objective Constitutional Pulse regular. Respirations normal and unlabored. Afebrile. Vitals Time Taken: 1:50 PM, Height: 70 in, Weight: 230 lbs, BMI: 33, Temperature: 97.6 F, Pulse: 72 bpm, Blood Pressure: 150/73 mmHg. Eyes Nonicteric. Reactive to light. Ears, Nose, Mouth, and Throat Lips, teeth, and gums WNL.Marland Kitchen Moist mucosa without lesions. Neck supple and nontender. No palpable supraclavicular or cervical adenopathy. Normal sized without goiter. Respiratory WNL. No retractions.. Breath sounds WNL, No rubs, rales, rhonchi, or wheeze.. Cardiovascular Heart rhythm and rate regular, no murmur or gallop.. Pedal Pulses WNL. No clubbing, cyanosis or edema. Chest Breasts symmetical and no nipple discharge.. Breast tissue WNL, no masses, lumps, or tenderness.. Lymphatic No adneopathy. No adenopathy. No adenopathy. [name] [name], [name] [name] ([id number]) Musculoskeletal Adexa without tenderness or enlargement.. Digits and nails w/o clubbing, cyanosis, infection, petechiae, ischemia, or inflammatory conditions.Marland Kitchen Psychiatric Judgement and insight Intact.. No evidence of depression, anxiety, or agitation.. General Notes: he has a lot of ulceration in the lower third of both right and left lower extremities with stage II lymphedema. Hygiene continues to be very poor and we will do our best with palliative care and wound Unna's boots. Integumentary (Hair, Skin) No suspicious lesions. No crepitus or fluctuance. No peri-wound warmth or erythema. No masses.. Wound #1 status is Open. Original  cause of wound was Gradually Appeared. The wound is located on the Right,Circumferential Lower Leg. The wound measures 10cm length x 17cm width x 0.1cm depth; 133.518cm^2 area and 13.352cm^3 volume. The wound is limited to skin breakdown. There is no tunneling or undermining noted. There is a large amount of serous drainage noted. The wound margin is indistinct and nonvisible. There is large (67-100%) pink, pale granulation within the wound bed. There is a small (1-33%) amount of necrotic tissue within the wound bed including Adherent Slough. The periwound skin appearance exhibited: Maceration, Moist. The periwound skin appearance did not exhibit: Callus, Crepitus, Excoriation, Fluctuance, Friable, Induration, Localized Edema, Rash, Scarring, Dry/Scaly, Atrophie Blanche, Cyanosis, Ecchymosis, Hemosiderin Staining, Mottled, Pallor, Rubor, Erythema. Periwound temperature was noted as No Abnormality. The periwound has tenderness on palpation. Wound #2 status is Open. Original cause of wound was Gradually Appeared. The wound is located on the Left,Circumferential Lower Leg. The wound measures 19cm length x 27cm width x 0.1cm depth; 402.909cm^2 area and 40.291cm^3 volume. The wound is limited to skin breakdown. There is no tunneling or undermining noted. There is a large amount of serous drainage noted. The wound margin is indistinct and nonvisible. There is large (67-100%) red, pink granulation within the wound bed. There is a small (1-33%) amount of necrotic tissue within the wound bed including Adherent Slough. The periwound skin appearance exhibited: Localized Edema, Maceration, Moist. The periwound skin appearance did not exhibit: Callus, Crepitus, Excoriation, Fluctuance, Friable, Induration, Rash, Scarring, Dry/Scaly, Atrophie Blanche, Cyanosis, Ecchymosis, Hemosiderin Staining, Mottled, Pallor, Rubor, Erythema. Periwound temperature was noted as No Abnormality. The periwound has tenderness on  palpation. Wound #3 status is Open. Original cause of wound was Gradually Appeared. The wound is located on the [place]. The wound measures 0.5cm length x 1.5cm width x 0.1cm depth; 0.589cm^2 area and 0.059cm^3 volume. There is no tunneling or undermining noted. The wound margin is flat and intact. There is large (67-100%) pink, pale granulation within the wound bed. There is no necrotic tissue within the wound bed. Wound #5 status is Open. Original cause of wound was Gradually Appeared. The wound is located on the Left,Dorsal Foot. The wound measures 0.4cm length x 0.2cm width x 0.1cm depth;  0.063cm^2 area and 0.006cm^3 volume. There is no tunneling or undermining noted. There is a small amount of serous drainage noted. The wound margin is flat and intact. There is large (67-100%) pink granulation within the wound bed. There is no necrotic tissue within the wound bed. 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 Wound #2 Left,Circumferential Lower Leg: May shower with protection. Wound #3 Left,Plantar Foot: May shower with protection. Wound #5 Left,Dorsal Foot: May shower with protection. Primary Wound Dressing: Wound #1 Right,Circumferential Lower Leg: Aquacel Ag Wound #2 Left,Circumferential Lower  Leg: Aquacel Ag Wound #3 Left,Plantar Foot: Aquacel Ag Wound #5 Left,Dorsal Foot: Aquacel Ag Secondary Dressing: Wound #1 Right,Circumferential Lower Leg: ABD pad Dry Gauze Drawtex Wound #2 Left,Circumferential Lower Leg: ABD pad Dry Gauze Drawtex Wound #3 Left,Plantar Foot: ABD pad Tones, Aadin ([id number]) Dry Gauze Drawtex Wound #5 Left,Dorsal Foot: ABD pad Dry Gauze Drawtex Dressing Change Frequency: Wound #1 Right,Circumferential Lower Leg: Three times weekly Wound #2 Left,Circumferential Lower Leg: Three times weekly Wound #3 Left,Plantar Foot: Three times weekly Wound #5 Left,Dorsal Foot: Three times weekly Follow-up Appointments: Wound #1 Right,Circumferential Lower Leg: Return Appointment in 2 weeks. - HHRN to come 3x/week next week Wound #2 Left,Circumferential Lower Leg: Return Appointment in 2 weeks. - HHRN to come 3x/week next week Wound #3 Left,Plantar Foot: Return Appointment in 2 weeks. - HHRN to come 3x/week next week Wound #5 Left,Dorsal Foot: Return Appointment in 2 weeks. - HHRN to come 3x/week next week Edema Control: Wound #1 Right,Circumferential Lower Leg: Unna Boots Bilaterally Other: - bring wraps that was shipped to house Wound #2 Left,Circumferential Lower Leg: Unna Boots Bilaterally Other: - bring wraps that was shipped to house Wound #3 Left,Plantar Foot: Unna Boots Bilaterally Other: - bring wraps that was shipped to house Wound #5 Left,Dorsal Foot: Unna Boots Bilaterally Other: - bring wraps that was shipped to house Home Health: Wound #1 Right,Circumferential Lower Leg: Continue Home Health Visits - Please see Mr Chadick 3x next week. Return to clinic in two weeks (April 24) [place] Nurse may visit PRN to address patient s wound care needs. Home Health Nurse may visit PRN to address patient s wound care needs. FACE TO FACE ENCOUNTER: MEDICARE and MEDICAID PATIENTS: I certify that this patient is  under my care and that I had a face-to-face encounter that meets the physician face-to-face encounter requirements with this patient on this date. The encounter with the patient was in whole or in part for the following MEDICAL CONDITION: (primary reason for [place]) MEDICAL NECESSITY: I certify, that based on my findings, NURSING services are a medically necessary home health service. HOME BOUND STATUS: I certify that my clinical findings support that this patient is homebound (i.e., Due to illness or injury, pt requires aid of supportive devices such as crutches, cane, wheelchairs, walkers, the use of special transportation or the assistance of another person to leave their  place of residence. There is a Goggins, Keion ([id number]) normal inability to leave the home and doing so requires considerable and taxing effort. Other absences are for medical reasons / religious services and are infrequent or of short duration when for other reasons). FACE TO FACE ENCOUNTER: MEDICARE and MEDICAID PATIENTS: I certify that this patient is under my care and that I had a face-to-face encounter that meets the physician face-to-face encounter requirements with this patient on this date. The encounter with the patient was in whole or in part for the following MEDICAL CONDITION: (primary reason for [place]) MEDICAL NECESSITY: I certify, that based on my findings, NURSING services are a medically necessary home health service. HOME BOUND STATUS: I certify that my clinical findings support that this patient is homebound (i.e., Due to illness or injury, pt requires aid of supportive devices such as crutches, cane, wheelchairs, walkers, the use of special transportation or the assistance of another person to leave their place of residence. There is a normal inability to leave the home and doing so requires considerable and taxing effort. Other absences are for medical reasons / religious services and  are infrequent or of short duration when for other reasons). If current dressing causes regression in wound condition, may D/C ordered dressing product/s and apply Normal Saline Moist Dressing daily until next [place] / Other MD appointment. [place] of regression in wound condition at [phone]. If current dressing causes regression in wound condition, may D/C ordered dressing product/s and apply Normal Saline Moist Dressing daily until next [place] / Other MD appointment. [place] of regression in wound condition at [phone]. Please direct any NON-WOUND related issues/requests for orders to patient's Primary Care Physician Please direct any NON-WOUND related issues/requests for orders to patient's Primary Care Physician Wound #2 Left,Circumferential Lower Leg: [place] Visits - Please see Mr Kolton 3x next week. Return to clinic in two weeks (April 24) [place] Nurse may visit PRN to address patient s wound care needs. Home Health Nurse may visit PRN to address patient s wound care needs. FACE TO FACE ENCOUNTER: MEDICARE and MEDICAID PATIENTS: I certify that this patient is under my care and that I had a face-to-face encounter that meets the physician face-to-face encounter requirements with this patient on this date. The encounter with the patient was in whole or in part for the following MEDICAL CONDITION: (primary reason for [place]) MEDICAL NECESSITY: I certify, that based on my findings, NURSING services are a medically necessary home health service. HOME BOUND STATUS: I certify that my clinical findings support that this patient is homebound (i.e., Due to illness or injury, pt requires aid of supportive devices such as crutches, cane, wheelchairs, walkers, the use of special transportation or the assistance of another person to leave their place of residence. There is  a normal inability to leave the home and doing so requires considerable and taxing effort. Other absences are for medical reasons / religious services and are infrequent or of short duration when for other reasons). FACE TO FACE ENCOUNTER: MEDICARE and MEDICAID PATIENTS: I certify that this patient is under my care and that I had a face-to-face encounter that meets the physician face-to-face encounter requirements with this patient on this date. The encounter with the patient was in whole or in part for the following MEDICAL CONDITION: (primary reason for [place]) MEDICAL NECESSITY: I certify, that based on my findings, NURSING  services are a medically necessary home health service. HOME BOUND STATUS: I certify that my clinical findings support that this patient is homebound (i.e., Due to illness or injury, pt requires aid of supportive devices such as crutches, cane, wheelchairs, walkers, the use of special transportation or the assistance of another person to leave their place of residence. There is a normal inability to leave the home and doing so requires considerable and taxing effort. Other absences are for medical reasons / religious services and are infrequent or of short duration when for other reasons). If current dressing causes regression in wound condition, may D/C ordered dressing product/s and apply Normal Saline Moist Dressing daily until next [place] / Other MD appointment. [place] of regression in wound condition at [phone]. If current dressing causes regression in wound condition, may D/C ordered dressing product/s and apply Normal Saline Moist Dressing daily until next [place] / Other MD appointment. [place] of regression in wound condition at [phone]. CHESKEL, QUISPE ([id number]) Please direct any NON-WOUND related issues/requests for orders to patient's Primary Care Physician Please direct any  NON-WOUND related issues/requests for orders to patient's Primary Care Physician Wound #3 Left,Plantar Foot: [place] Visits - Please see Mr Derogatis 3x next week. Return to clinic in two weeks (April 24) [place] Nurse may visit PRN to address patient s wound care needs. Home Health Nurse may visit PRN to address patient s wound care needs. FACE TO FACE ENCOUNTER: MEDICARE and MEDICAID PATIENTS: I certify that this patient is under my care and that I had a face-to-face encounter that meets the physician face-to-face encounter requirements with this patient on this date. The encounter with the patient was in whole or in part for the following MEDICAL CONDITION: (primary reason for [place]) MEDICAL NECESSITY: I certify, that based on my findings, NURSING services are a medically necessary home health service. HOME BOUND STATUS: I certify that my clinical findings support that this patient is homebound (i.e., Due to illness or injury, pt requires aid of supportive devices such as crutches, cane, wheelchairs, walkers, the use of special transportation or the assistance of another person to leave their place of residence. There is a normal inability to leave the home and doing so requires considerable and taxing effort. Other absences are for medical reasons / religious services and are infrequent or of short duration when for other reasons). FACE TO FACE ENCOUNTER: MEDICARE and MEDICAID PATIENTS: I certify that this patient is under my care and that I had a face-to-face encounter that meets the physician face-to-face encounter requirements with this patient on this date. The encounter with the patient was in whole or in part for the following MEDICAL CONDITION: (primary reason for [place]) MEDICAL NECESSITY: I certify, that based on my findings, NURSING services are a medically necessary home health service. HOME BOUND STATUS: I certify that my  clinical findings support that this patient is homebound (i.e., Due to illness or injury, pt requires aid of supportive devices such as crutches, cane, wheelchairs, walkers, the use of special transportation or the assistance of another person to leave their place of residence. There is a normal inability to leave the home and doing so requires considerable and taxing effort. Other absences are for medical reasons / religious services and are infrequent or of short duration when for other reasons). If current dressing causes regression in wound condition, may D/C ordered dressing product/s and  apply Normal Saline Moist Dressing daily until next [place] / Other MD appointment. [place] of regression in wound condition at [phone]. If current dressing causes regression in wound condition, may D/C ordered dressing product/s and apply Normal Saline Moist Dressing daily until next [place] / Other MD appointment. [place] of regression in wound condition at [phone]. Please direct any NON-WOUND related issues/requests for orders to patient's Primary Care Physician Please direct any NON-WOUND related issues/requests for orders to patient's Primary Care Physician Wound #5 Left,Dorsal Foot: [place] Visits - Please see Mr Mazar 3x next week. Return to clinic in two weeks (April 24) [place] Nurse may visit PRN to address patient s wound care needs. Home Health Nurse may visit PRN to address patient s wound care needs. FACE TO FACE ENCOUNTER: MEDICARE and MEDICAID PATIENTS: I certify that this patient is under my care and that I had a face-to-face encounter that meets the physician face-to-face encounter requirements with this patient on this date. The encounter with the patient was in whole or in part for the following MEDICAL CONDITION: (primary reason for [place]) MEDICAL NECESSITY:  I certify, that based on my findings, NURSING services are a medically necessary home health service. HOME BOUND STATUS: I certify that my clinical findings support that this patient is homebound (i.e., Due to illness or injury, pt requires aid of supportive devices such as crutches, cane, wheelchairs, walkers, the use of special transportation or the assistance of another person to leave their place of residence. There is a normal inability to leave the home and doing so requires considerable and taxing effort. Other absences are for medical reasons / religious services and are infrequent or of short duration when for other reasons). FACE TO FACE ENCOUNTER: MEDICARE and MEDICAID PATIENTS: I certify that this patient is under Nielson, [name] ([id number]) my care and that I had a face-to-face encounter that meets the physician face-to-face encounter requirements with this patient on this date. The encounter with the patient was in whole or in part for the following MEDICAL CONDITION: (primary reason for [place]) MEDICAL NECESSITY: I certify, that based on my findings, NURSING services are a medically necessary home health service. HOME BOUND STATUS: I certify that my clinical findings support that this patient is homebound (i.e., Due to illness or injury, pt requires aid of supportive devices such as crutches, cane, wheelchairs, walkers, the use of special transportation or the assistance of another person to leave their place of residence. There is a normal inability to leave the home and doing so requires considerable and taxing effort. Other absences are for medical reasons / religious services and are infrequent or of short duration when for other reasons). If current dressing causes regression in wound condition, may D/C ordered dressing product/s and apply Normal Saline Moist Dressing daily until next [place] / Other MD appointment. [place] of regression  in wound condition at [phone]. If current dressing causes regression in wound condition, may D/C ordered dressing product/s and apply Normal Saline Moist Dressing daily until next [place] / Other MD appointment. [place] of regression in wound condition at [phone]. Please direct any NON-WOUND related issues/requests for orders to patient's Primary Care Physician Please direct any NON-WOUND related issues/requests for orders to patient's Primary Care Physician I have recommended silver alginate and Unna's boots to be applied and changed twice weekly. I have  also recommended elevation and exercise and will order him Juxtalite compression Velcro wrap. Electronic Signature(s) Signed: 08/06/2015 3:27:44 PM By: Christin Fudge MD, FACS Previous Signature: 08/06/2015 2:14:50 PM Version By: Christin Fudge MD, FACS Entered By: Christin Fudge on 08/06/2015 15:27:43 [name] [name] ([id number]) -------------------------------------------------------------------------------- SuperBill Details Patient Name: [name] [name] Date of Service: 08/06/2015 Medical Record Number: [id number] Patient Account Number: [id number] Date of Birth/Sex: [birth year] (82 y.o. Male) Treating RN: Macarthur Critchley Primary Care Physician: Alvester Chou Other Clinician: Referring Physician: Alvester Chou Treating Physician/Extender: Frann Rider in Treatment: 17 Diagnosis Coding ICD-10 Codes Code Description E11.622 Type 2 diabetes mellitus with other skin ulcer I89.0 Lymphedema, not elsewhere classified I70.238 Atherosclerosis of native arteries of right leg with ulceration of other part of lower right leg I70.248 Atherosclerosis of native arteries of left leg with ulceration of other part of lower left leg Chronic venous hypertension (idiopathic) with ulcer and inflammation of right lower I87.331 extremity Chronic venous hypertension (idiopathic) with ulcer and inflammation of left  lower I87.332 extremity Facility Procedures CPT4 Code: [id number] Description: 29580 - APPLY UNNA BOOT/PROFO BILATERAL Modifier: Quantity: 1 Physician Procedures CPT4: Description Modifier Quantity Code [id number] - WC PHYS LEVEL 3 - EST PT 1 ICD-10 Description Diagnosis E11.622 Type 2 diabetes mellitus with other skin ulcer I89.0 Lymphedema, not elsewhere classified I70.238 Atherosclerosis of native arteries  of right leg with ulceration of other part of lower right leg I87.331 Chronic venous hypertension (idiopathic) with ulcer and inflammation of right lower extremity Electronic Signature(s) Signed: 08/06/2015 3:21:43 PM By: Christin Fudge MD, FACS Signed: 08/06/2015 4:17:15 PM By: Rebecca Eaton RN, Sendra Previous Signature: 08/06/2015 2:15:17 PM Version By: Christin Fudge MD, FACS Entered By: Rebecca Eaton RN, Sendra on 08/06/2015 14:37:08

## 2015-08-20 ENCOUNTER — Encounter: Payer: Medicare Other | Admitting: Surgery

## 2015-08-20 DIAGNOSIS — E11622 Type 2 diabetes mellitus with other skin ulcer: Secondary | ICD-10-CM | POA: Diagnosis not present
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
## 2015-08-20 NOTE — Progress Notes (Addendum)
MIHAJLO, HUDELSON ([id number]) Visit Report for 08/20/2015 Chief Complaint Document Details Patient Name: [name] [name], [name] [name] Date of Service: 08/20/2015 1:30 PM Medical Record Number: [id number] Patient Account Number: [id number] Date of Birth/Sex: [birth year] (82 y.o. Male) Treating RN: Macarthur Critchley Primary Care Physician: Alvester Chou Other Clinician: Referring Physician: Alvester Chou Treating Physician/Extender: Frann Rider in Treatment: 82 Information Obtained from: Patient Chief Complaint Patient presents to the wound care center for a consult due non healing wound both lower extremities. The patient is a poor historian and was seen here once in July 2016. He says home health was doing his dressings and he was healed but now has recurrence of the problem. Problem has been there at least for 6 months. Electronic Signature(s) Signed: 08/20/2015 1:26:09 PM By: Christin Fudge MD, FACS Entered By: Christin Fudge on 08/20/2015 13:26:09 [name] [name] ([id number]) -------------------------------------------------------------------------------- HPI Details Patient Name: [name] [name] Date of Service: 08/20/2015 1:30 PM Medical Record Number: [id number] Patient Account Number: [id number] Date of Birth/Sex: [birth year] (82 y.o. Male) Treating RN: Macarthur Critchley Primary Care Physician: Alvester Chou Other Clinician: Referring Physician: Alvester Chou Treating Physician/Extender: Frann Rider in Treatment: 19 History of Present Illness Location: swelling of the legs right more than left an ulceration both lower extremities Quality: Patient reports experiencing a dull pain to affected area(s). Severity: Patient states wound (s) are getting worse Duration: Patient has had the wound for > 6 months prior to seeking treatment at the wound center Timing: Pain in wound is Intermittent. Context: The wound appeared gradually over time Modifying Factors: he has been having home health come and apply his  dressing daily was healed. Associated Signs and Symptoms: Patient reports having difficulty standing for long periods. HPI Description: [name] [name] is a 82 y.o. male with history of DM, hypertension, chronic kidney disease, gout, diastolic dysfunction and the patient was having increasing swelling with increasing fluid draining from the legs and pain over the last 6 weeks. He says his wounds are completely healed and the home health had stopped coming to do his dressings. Recent hemoglobin A1c was 6.6. During the last admission A venous duplex study revealed no evidence of deep vein thrombosis or superficial thrombosis involving the right and left lower extremity. Was seen in January 2016 by Dr. Adele Barthel at [facility]: Non-Invasive Vascular Imaging ABI (05/05/2014) o R: 0.65 (0.73), DP: mono, PT: mono, TBI: 0.57 o L: 0.65 (0.69), DP: mono, PT: mono, TBI: 0.36 The patient had the diagnosis of bilateral lower extremity moderate peripheral atherosclerotic disease, bilateral lower extremity chronic venous insufficiency. Due to the patient's age and mental status conservative therapy was only discussed and no surgical intervention was planned. The patient has significant dementia and has a caregiver who spends a few hours every day coming to see him. He does not have any family members. 05/21/2015 -- lower extremity arterial studies done on 05/17/2015 show the right ABI is 0.50-0.79 indicating moderate arterial occlusive disease at rest. The left ABI was 0.5-0.79 indicating moderate arterial occlusive disease at rest. The left posterior tibial and peroneal arteries were not evaluated due to local wound condition. The right TBI was less than 0.69 and the left ABI was less than 0.69 which are both abnormal. Since it's been a year since the previous opinion from Dr. Bridgett Larsson I have asked him to see him for a review. 05/28/2015 -- was seen by Dr. Bridgett Larsson and he recommended that he was not a good candidate for  surgery and surgical revascularization if the need develops. He  recommends to continue with compressive therapy and wound care. 06/04/2015 -- had asked him to remove his compression wraps have a shower and come to see as but he has probably been scratching his legs and caused a lot of excoriation and has multiple open ulcers which are much more worse than before. The patient is not very compliant and may be this is because of his KANNAN, PAMINTUAN ([id number]) dementia. 08/06/2015 -- his hygiene continues to be very bad and he has a lot of drainage from his wounds and is not happy about his home health nurses and would like to change them. Electronic Signature(s) Signed: 08/20/2015 1:26:20 PM By: Christin Fudge MD, FACS Entered By: Christin Fudge on 08/20/2015 13:26:20 [name] [name] ([id number]) -------------------------------------------------------------------------------- Physical Exam Details Patient Name: [name] [name] Date of Service: 08/20/2015 1:30 PM Medical Record Number: [id number] Patient Account Number: [id number] Date of Birth/Sex: 1926/11/08 (82 y.o. Male) Treating RN: Macarthur Critchley Primary Care Physician: Alvester Chou Other Clinician: Referring Physician: Alvester Chou Treating Physician/Extender: Frann Rider in Treatment: 19 Constitutional . Pulse regular. Respirations normal and unlabored. Afebrile. . Eyes Nonicteric. Reactive to light. Ears, Nose, Mouth, and Throat Lips, teeth, and gums WNL.Marland Kitchen Moist mucosa without lesions. Neck supple and nontender. No palpable supraclavicular or cervical adenopathy. Normal sized without goiter. Respiratory WNL. No retractions.. Cardiovascular Pedal Pulses WNL. No clubbing, cyanosis or edema. Chest Breasts symmetical and no nipple discharge.. Breast tissue WNL, no masses, lumps, or tenderness.. Lymphatic No adneopathy. No adenopathy. No adenopathy. Musculoskeletal Adexa without tenderness or enlargement.. Digits and nails w/o  clubbing, cyanosis, infection, petechiae, ischemia, or inflammatory conditions.. Integumentary (Hair, Skin) No suspicious lesions. No crepitus or fluctuance. No peri-wound warmth or erythema. No masses.Marland Kitchen Psychiatric Judgement and insight Intact.. No evidence of depression, anxiety, or agitation.. Notes he has a lot of ulceration in the lower third of both right and left lower extremities with stage II lymphedema. the situation has gotten very much worse left more than right. Hygiene continues to be very poor and we will do our best with palliative care and wound Unna's boots. Electronic Signature(s) Signed: 08/20/2015 2:02:30 PM By: Christin Fudge MD, FACS Previous Signature: 08/20/2015 1:46:03 PM Version By: Christin Fudge MD, FACS Entered By: Christin Fudge on 08/20/2015 14:02:30 [name] [name] ([id number]) -------------------------------------------------------------------------------- Physician Orders Details Patient Name: [name] [name] Date of Service: 08/20/2015 1:30 PM Medical Record Number: [id number] Patient Account Number: [id number] Date of Birth/Sex: [birth year] (82 y.o. Male) Treating RN: Macarthur Critchley Primary Care Physician: Alvester Chou Other Clinician: Referring Physician: Alvester Chou Treating Physician/Extender: Frann Rider in Treatment: 19 Verbal / Phone Orders: Yes Clinician: Macarthur Critchley Read Back and Verified: Yes Diagnosis Coding ICD-10 Coding Code Description E11.622 Type 2 diabetes mellitus with other skin ulcer I89.0 Lymphedema, not elsewhere classified I70.238 Atherosclerosis of native arteries of right leg with ulceration of other part of lower right leg I70.248 Atherosclerosis of native arteries of left leg with ulceration of other part of lower left leg Chronic venous hypertension (idiopathic) with ulcer and inflammation of right lower I87.331 extremity Chronic venous hypertension (idiopathic) with ulcer and inflammation of left  lower I87.332 extremity Wound Cleansing Wound #1 Right,Circumferential Lower Leg o May shower with protection. Primary Wound Dressing Wound #1 Right,Circumferential Lower Leg o Aquacel Ag Wound #2 Left,Circumferential Lower Leg o Aquacel Ag Wound #3 Left,Plantar Foot o Aquacel Ag Secondary Dressing Wound #1 Right,Circumferential Lower Leg o ABD pad o Dry Gauze o Drawtex Wound #2 Left,Circumferential Lower Leg o ABD pad o Dry Gauze o Drawtex  [name] [name], [name] [name] ([id number]) Wound #3 Left,Plantar Foot o ABD pad o Dry Gauze o Drawtex Dressing Change Frequency Wound #1 Right,Circumferential Lower Leg o Dressing is to be changed Monday and Thursday. Wound #2 Left,Circumferential Lower Leg o Dressing is to be changed Monday and Thursday. Wound #3 Left,Plantar Foot o Dressing is to be changed Monday and Thursday. Follow-up Appointments Wound #1 Right,Circumferential Lower Leg o Return Appointment in 1 week. o Nurse Visit as needed - April 27th Wound #2 Left,Circumferential Lower Leg o Return Appointment in 1 week. o Nurse Visit as needed - April 27th Wound #3 Left,Plantar Foot o Return Appointment in 1 week. o Nurse Visit as needed - April 27th Edema Control Wound #1 Right,Circumferential Lower Leg o Unna Boots Bilaterally Wound #2 Left,Circumferential Lower Leg o Unna Boots Bilaterally Wound #3 Left,Plantar Foot o Unna Boots Bilaterally Home Health Wound #1 Right,Circumferential Lower Leg o [place] Visits - hold [facility] visits this week, patient has issues at home. will come to clinic for nurse visit Wound #2 Left,Circumferential Lower Leg o [place] Visits - hold [facility] visits this week, patient has issues at home. will come to clinic for nurse visit [name] [name], [name] [name] ([id number]) Wound #3 [place] Visits - hold [facility] visits this week, patient has issues at home. will come  to clinic for nurse visit Medications-please add to medication list. Wound #1 Right,Circumferential Lower Leg o P.O. Antibiotics - doxycycline Wound #2 Left,Circumferential Lower Leg o P.O. Antibiotics - doxycycline Wound #3 Left,Plantar Foot o P.O. Antibiotics - doxycycline Electronic Signature(s) Signed: 08/20/2015 3:39:07 PM By: Christin Fudge MD, FACS Signed: 08/27/2015 4:34:19 PM By: Rebecca Eaton RN, Sendra Previous Signature: 08/20/2015 3:31:28 PM Version By: Rebecca Eaton RN, Sendra Entered By: Rebecca Eaton RN, Sendra on 08/20/2015 15:35:01 [name] [name] ([id number]) -------------------------------------------------------------------------------- Problem List Details Patient Name: [name] [name] Date of Service: 08/20/2015 1:30 PM Medical Record Number: [id number] Patient Account Number: [id number] Date of Birth/Sex: August 08, 1926 (82 y.o. Male) Treating RN: Macarthur Critchley Primary Care Physician: Alvester Chou Other Clinician: Referring Physician: Alvester Chou Treating Physician/Extender: Frann Rider in Treatment: 19 Active Problems ICD-10 Encounter Code Description Active Date Diagnosis E11.622 Type 2 diabetes mellitus with other skin ulcer 04/09/2015 Yes I89.0 Lymphedema, not elsewhere classified 04/09/2015 Yes I70.238 Atherosclerosis of native arteries of right leg with 04/09/2015 Yes ulceration of other part of lower right leg I70.248 Atherosclerosis of native arteries of left leg with ulceration 04/09/2015 Yes of other part of lower left leg I87.331 Chronic venous hypertension (idiopathic) with ulcer and 04/09/2015 Yes inflammation of right lower extremity I87.332 Chronic venous hypertension (idiopathic) with ulcer and 04/09/2015 Yes inflammation of left lower extremity Inactive Problems Resolved Problems Electronic Signature(s) Signed: 08/20/2015 2:01:51 PM By: Christin Fudge MD, FACS Previous Signature: 08/20/2015 1:25:59 PM Version By: Christin Fudge MD, FACS Entered By:  Christin Fudge on 08/20/2015 14:01:50 [name] [name] ([id number]) -------------------------------------------------------------------------------- Progress Note Details Patient Name: [name] [name] Date of Service: 08/20/2015 1:30 PM Medical Record Number: [id number] Patient Account Number: [id number] Date of Birth/Sex: 1926-09-10 (82 y.o. Male) Treating RN: Macarthur Critchley Primary Care Physician: Alvester Chou Other Clinician: Referring Physician: Alvester Chou Treating Physician/Extender: Frann Rider in Treatment: 19 Subjective Chief Complaint Information obtained from Patient Patient presents to the wound care center for a consult due non healing wound both lower extremities. The patient is a poor historian and was seen here once in July 2016. He says home health was doing his dressings and he was healed but now has recurrence of the  problem. Problem has been there at least for 6 months. History of Present Illness (HPI) The following HPI elements were documented for the patient's wound: Location: swelling of the legs right more than left an ulceration both lower extremities Quality: Patient reports experiencing a dull pain to affected area(s). Severity: Patient states wound (s) are getting worse Duration: Patient has had the wound for > 6 months prior to seeking treatment at the wound center Timing: Pain in wound is Intermittent. Context: The wound appeared gradually over time Modifying Factors: he has been having home health come and apply his dressing daily was healed. Associated Signs and Symptoms: Patient reports having difficulty standing for long periods. Burach Bramson is a 82 y.o. male with history of DM, hypertension, chronic kidney disease, gout, diastolic dysfunction and the patient was having increasing swelling with increasing fluid draining from the legs and pain over the last 6 weeks. He says his wounds are completely healed and the home health had stopped coming to do his  dressings. Recent hemoglobin A1c was 6.6. During the last admission A venous duplex study revealed no evidence of deep vein thrombosis or superficial thrombosis involving the right and left lower extremity. Was seen in January 2016 by Dr. Adele Barthel at [facility]: Non-Invasive Vascular Imaging ABI (05/05/2014)  R: 0.65 (0.73), DP: mono, PT: mono, TBI: 0.57  L: 0.65 (0.69), DP: mono, PT: mono, TBI: 0.36 The patient had the diagnosis of bilateral lower extremity moderate peripheral atherosclerotic disease, bilateral lower extremity chronic venous insufficiency. Due to the patient's age and mental status conservative therapy was only discussed and no surgical intervention was planned. The patient has significant dementia and has a caregiver who spends a few hours every day coming to see him. He does not have any family members. 05/21/2015 -- lower extremity arterial studies done on 05/17/2015 show the right ABI is 0.50-0.79 indicating moderate arterial occlusive disease at rest. The left ABI was 0.5-0.79 indicating moderate arterial occlusive disease at rest. The left posterior tibial and peroneal arteries were not evaluated due to local wound [name] [name], [name] [name] ([id number]) condition. The right TBI was less than 0.69 and the left ABI was less than 0.69 which are both abnormal. Since it's been a year since the previous opinion from Dr. Bridgett Larsson I have asked him to see him for a review. 05/28/2015 -- was seen by Dr. Bridgett Larsson and he recommended that he was not a good candidate for surgery and surgical revascularization if the need develops. He recommends to continue with compressive therapy and wound care. 06/04/2015 -- had asked him to remove his compression wraps have a shower and come to see as but he has probably been scratching his legs and caused a lot of excoriation and has multiple open ulcers which are much more worse than before. The patient is not very compliant and may be this is because of  his dementia. 08/06/2015 -- his hygiene continues to be very bad and he has a lot of drainage from his wounds and is not happy about his home health nurses and would like to change them. Objective Constitutional Pulse regular. Respirations normal and unlabored. Afebrile. Vitals Time Taken: 1:45 PM, Height: 70 in, Weight: 230 lbs, BMI: 33, Temperature: 97.2 F, Pulse: 93 bpm, Blood Pressure: 145/68 mmHg. Eyes Nonicteric. Reactive to light. Ears, Nose, Mouth, and Throat Lips, teeth, and gums WNL.Marland Kitchen Moist mucosa without lesions. Neck supple and nontender. No palpable supraclavicular or cervical adenopathy. Normal sized without goiter. Respiratory WNL. No retractions.. Cardiovascular Pedal Pulses  WNL. No clubbing, cyanosis or edema. Chest Breasts symmetical and no nipple discharge.. Breast tissue WNL, no masses, lumps, or tenderness.. Lymphatic No adneopathy. No adenopathy. No adenopathy. [name] [name], [name] [name] ([id number]) Musculoskeletal Adexa without tenderness or enlargement.. Digits and nails w/o clubbing, cyanosis, infection, petechiae, ischemia, or inflammatory conditions.Marland Kitchen Psychiatric Judgement and insight Intact.. No evidence of depression, anxiety, or agitation.. General Notes: he has a lot of ulceration in the lower third of both right and left lower extremities with stage II lymphedema. the situation has gotten very much worse left more than right. Hygiene continues to be very poor and we will do our best with palliative care and wound Unna's boots. Integumentary (Hair, Skin) No suspicious lesions. No crepitus or fluctuance. No peri-wound warmth or erythema. No masses.. Wound #1 status is Open. Original cause of wound was Gradually Appeared. The wound is located on the Right,Circumferential Lower Leg. The wound measures 15.5cm length x 37cm width x 0.1cm depth; 450.426cm^2 area and 45.043cm^3 volume. Wound #2 status is Open. Original cause of wound was Gradually Appeared. The wound  is located on the Left,Circumferential Lower Leg. The wound measures 15cm length x 30cm width x 0.1cm depth; 353.429cm^2 area and 35.343cm^3 volume. Wound #3 status is Open. Original cause of wound was Gradually Appeared. The wound is located on the [place]. The wound measures 0.3cm length x 1cm width x 0.1cm depth; 0.236cm^2 area and 0.024cm^3 volume. Wound #5 status is Open. Original cause of wound was Gradually Appeared. The wound is located on the Left,Dorsal Foot. The wound measures 0cm length x 0cm width x 0cm depth; 0cm^2 area and 0cm^3 volume. 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 I70.248 - Atherosclerosis of native arteries of left leg with ulceration of other part of lower left leg I87.331 - Chronic venous hypertension (idiopathic) with ulcer and inflammation of right lower extremity I87.332 - Chronic venous hypertension (idiopathic) with ulcer and inflammation of left lower extremity [name] [name], [name] [name] ([id number]) Due to his dismissal home condition where he has a lot of roaches and other investigation the home health nurses have refused to go there to change his dressing and the patient's condition has deteriorated significantly. I have recommended we changed to twice a week dressings with him coming back for a nurse visit on Thursday. We will continue to use Aquacel Ag and Unna's boots. Plan Wound Cleansing: Wound #1 Right,Circumferential Lower Leg: May shower with protection. Primary Wound Dressing: Wound #1 Right,Circumferential Lower Leg: Aquacel Ag Wound #2 Left,Circumferential Lower Leg: Aquacel Ag Wound #3 Left,Plantar Foot: Aquacel Ag Secondary Dressing: Wound #1 Right,Circumferential Lower Leg: ABD pad Dry Gauze Drawtex Wound #2 Left,Circumferential  Lower Leg: ABD pad Dry Gauze Drawtex Wound #3 Left,Plantar Foot: ABD pad Dry Gauze Drawtex Dressing Change Frequency: Wound #1 Right,Circumferential Lower Leg: Dressing is to be changed Monday and Thursday. Wound #2 Left,Circumferential Lower Leg: Dressing is to be changed Monday and Thursday. Wound #3 Left,Plantar Foot: Dressing is to be changed Monday and Thursday. Follow-up Appointments: Wound #1 Right,Circumferential Lower Leg: Return Appointment in 1 week. Nurse Visit as needed - April 27th Wound #2 Left,Circumferential Lower Leg: Return Appointment in 1 week. Nurse Visit as needed - April 27th Wound #3 Left,Plantar Foot: Return Appointment in 1 week. Nurse Visit as needed - April 27th [name] [name], [name] [name] ([id number]) Edema Control: Wound #1 Right,Circumferential Lower  Leg: Unna Boots Bilaterally Wound #2 Left,Circumferential Lower Leg: Unna Boots Bilaterally Wound #3 Left,Plantar Foot: Retail buyer Bilaterally Home Health: Wound #1 Right,Circumferential Lower Leg: Continue Home Health Visits - hold [facility] visits this week, patient has issues at home. will come to clinic for nurse visit Wound #2 Left,Circumferential Lower Leg: [place] Visits - hold [facility] visits this week, patient has issues at home. will come to clinic for nurse visit Wound #3 Left,Plantar Foot: [place] Visits - hold [facility] visits this week, patient has issues at home. will come to clinic for nurse visit Medications-please add to medication list.: Wound #1 Right,Circumferential Lower Leg: P.O. Antibiotics - doxycycline Wound #2 Left,Circumferential Lower Leg: P.O. Antibiotics - doxycycline Wound #3 Left,Plantar Foot: P.O. Antibiotics - doxycycline Due to his dismissal home condition where he has a lot of roaches and other investigation the home health nurses have refused to go there to change his dressing and the patient's condition has deteriorated significantly. I have recommended we  changed to twice a week dressings with him coming back for a nurse visit on Thursday. We will continue to use Aquacel Ag and Unna's boots. Electronic Signature(s) Signed: 08/20/2015 3:40:10 PM By: Christin Fudge MD, FACS Previous Signature: 08/20/2015 2:06:14 PM Version By: Christin Fudge MD, FACS Previous Signature: 08/20/2015 2:03:41 PM Version By: Christin Fudge MD, FACS Previous Signature: 08/20/2015 2:03:28 PM Version By: Christin Fudge MD, FACS Entered By: Christin Fudge on 08/20/2015 15:40:10 [name] [name] ([id number]) -------------------------------------------------------------------------------- SuperBill Details Patient Name: [name] [name] Date of Service: 08/20/2015 Medical Record Number: [id number] Patient Account Number: [id number] Date of Birth/Sex: Nov 30, 1926 (82 y.o. Male) Treating RN: Macarthur Critchley Primary Care Physician: Alvester Chou Other Clinician: Referring Physician: Alvester Chou Treating Physician/Extender: Frann Rider in Treatment: 19 Diagnosis Coding ICD-10 Codes Code Description E11.622 Type 2 diabetes mellitus with other skin ulcer I89.0 Lymphedema, not elsewhere classified I70.238 Atherosclerosis of native arteries of right leg with ulceration of other part of lower right leg I70.248 Atherosclerosis of native arteries of left leg with ulceration of other part of lower left leg Chronic venous hypertension (idiopathic) with ulcer and inflammation of right lower I87.331 extremity Chronic venous hypertension (idiopathic) with ulcer and inflammation of left lower I87.332 extremity Facility Procedures CPT4 Code: [id number] Description: 29580 - APPLY UNNA BOOT/PROFO BILATERAL Modifier: Quantity: 1 Physician Procedures CPT4: Description Modifier Quantity Code [id number] - WC PHYS LEVEL 3 - EST PT 1 ICD-10 Description Diagnosis E11.622 Type 2 diabetes mellitus with other skin ulcer I89.0 Lymphedema, not elsewhere classified I70.238 Atherosclerosis of native  arteries  of right leg with ulceration of other part of lower right leg I87.331 Chronic venous hypertension (idiopathic) with ulcer and inflammation of right lower extremity CPT4: [facility] Physician services for initial certification of Medicare-covered home health 1 services, billable once for a patient's home health certification period. ICD-10 Description Diagnosis E11.622 Type 2 diabetes mellitus with other skin ulcer [name] [name], [name] [name] ([id number]) Electronic Signature(s) Signed: 08/23/2015 4:28:22 PM By: Christin Fudge MD, FACS Previous Signature: 08/20/2015 3:31:28 PM Version By: Rebecca Eaton RN, Sendra Previous Signature: 08/20/2015 3:39:07 PM Version By: Christin Fudge MD, FACS Previous Signature: 08/20/2015 2:03:57 PM Version By: Christin Fudge MD, FACS Entered By: Sharon Mt on 08/22/2015 14:24:01

## 2015-08-23 DIAGNOSIS — E11622 Type 2 diabetes mellitus with other skin ulcer: Secondary | ICD-10-CM | POA: Diagnosis not present

## 2015-08-24 NOTE — Progress Notes (Signed)
[name] [name], [name] [name] ([id number]) Visit Report for 08/23/2015 Arrival Information Details Patient Name: [name] [name], [name] [name] Date of Service: 08/23/2015 1:30 PM Medical Record Number: [id number] Patient Account Number: [id number] Date of Birth/Sex: 1926-08-13 (82 y.o. Male) Treating RN: Montey Hora Primary Care Physician: Alvester Chou Other Clinician: Referring Physician: Alvester Chou Treating Physician/Extender: Frann Rider in Treatment: 7 Visit Information History Since Last Visit Added or deleted any medications: No Patient Arrived: Walker Any new allergies or adverse reactions: No Arrival Time: 12:54 Had a fall or experienced change in No Accompanied By: self activities of daily living that may affect Transfer Assistance: None risk of falls: Patient Identification Verified: Yes Signs or symptoms of abuse/neglect since last No Secondary Verification Process Completed: Yes visito Patient Has Alerts: Yes Hospitalized since last visit: No Pain Present Now: No Electronic Signature(s) Signed: 08/23/2015 6:41:05 PM By: Montey Hora Entered By: Montey Hora on 08/23/2015 12:55:50 [name] [name] ([id number]) -------------------------------------------------------------------------------- Encounter Discharge Information Details Patient Name: [name] [name] Date of Service: 08/23/2015 1:30 PM Medical Record Number: [id number] Patient Account Number: [id number] Date of Birth/Sex: 1926/08/10 (82 y.o. Male) Treating RN: Montey Hora Primary Care Physician: Alvester Chou Other Clinician: Referring Physician: Alvester Chou Treating Physician/Extender: Frann Rider in Treatment: 19 Encounter Discharge Information Items Discharge Pain Level: 0 Discharge Condition: Stable Ambulatory Status: Walker Discharge Destination: Home Private Transportation: Auto Accompanied By: self Schedule Follow-up Appointment: No Medication Reconciliation completed and No provided to Patient/Care  Gabe Glace: Clinical Summary of Care: Electronic Signature(s) Signed: 08/23/2015 6:08:25 PM By: Montey Hora Entered By: Montey Hora on 08/23/2015 18:08:25 [name] [name] ([id number]) -------------------------------------------------------------------------------- Patient/Caregiver Education Details Patient Name: [name] [name] Date of Service: 08/23/2015 1:30 PM Medical Record Number: [id number] Patient Account Number: [id number] Date of Birth/Gender: [birth year] (82 y.o. Male) Treating RN: Montey Hora Primary Care Physician: Alvester Chou Other Clinician: Referring Physician: Alvester Chou Treating Physician/Extender: Frann Rider in Treatment: 57 Education Assessment Education Provided To: Patient Education Topics Provided Venous: Handouts: Other: leg elevation for edema management Methods: Explain/Verbal Responses: State content correctly Electronic Signature(s) Signed: 08/23/2015 6:08:08 PM By: Montey Hora Entered By: Montey Hora on 08/23/2015 18:08:08 [name] [name] ([id number]) -------------------------------------------------------------------------------- Wound Assessment Details Patient Name: [name] [name] Date of Service: 08/23/2015 1:30 PM Medical Record Number: [id number] Patient Account Number: [id number] Date of Birth/Sex: 01-Oct-1926 (82 y.o. Male) Treating RN: Montey Hora Primary Care Physician: Alvester Chou Other Clinician: Referring Physician: Alvester Chou Treating Physician/Extender: Frann Rider in Treatment: 19 Wound Status Wound Number: 1 Primary Lymphedema Etiology: Wound Location: Right Lower Leg - Circumfernential Wound Open Status: Wounding Event: Gradually Appeared Comorbid Congestive Heart Failure, Date Acquired: 12/28/2014 History: Hypertension, Type II Diabetes, Weeks Of Treatment: 19 Neuropathy Clustered Wound: Yes Wound Measurements Length: (cm) 15.5 Width: (cm) 37 Depth: (cm) 0.1 Area: (cm) 450.426 Volume: (cm)  45.043 % Reduction in Area: -16.1% % Reduction in Volume: -16.1% Tunneling: No Undermining: No Wound Description Classification: Partial Thickness Diabetic Severity (Wagner): Grade 1 Wound Margin: Flat and Intact Exudate Amount: Large Exudate Type: Purulent Exudate Color: yellow, brown, gree Foul Odor After Cleansing: Yes Due to Product Use: No n Wound Bed Granulation Amount: Medium (34-66%) Exposed Structure Granulation Quality: Pink Fascia Exposed: No Necrotic Amount: Medium (34-66%) Fat Layer Exposed: No Necrotic Quality: Adherent Slough Tendon Exposed: No Muscle Exposed: No Joint Exposed: No Bone Exposed: No Limited to Skin Breakdown Periwound Skin Texture Texture Color No Abnormalities Noted: No No Abnormalities Noted: No Callus: No Atrophie Blanche: No Crepitus: No Cyanosis: No [name] [name], [name] [name] ([id number]) Excoriation: No Ecchymosis: No Fluctuance: No  Erythema: No Friable: No Hemosiderin Staining: No Induration: No Mottled: No Localized Edema: No Pallor: No Rash: No Rubor: No Scarring: No Temperature / Pain Moisture Temperature: No Abnormality No Abnormalities Noted: No Dry / Scaly: No Maceration: Yes Moist: Yes Wound Preparation Ulcer Cleansing: Rinsed/Irrigated with Saline Topical Anesthetic Applied: None Treatment Notes Wound #1 (Right, Circumferential Lower Leg) 1. Cleansed with: Clean wound with Normal Saline 4. Dressing Applied: Aquacel Ag Other dressing (specify in notes) 5. Secondary Dressing Applied ABD Pad 7. Secured with [employer] Bilaterally Notes xtrasorb Engineer, maintenance) Signed: 08/23/2015 6:06:01 PM By: Montey Hora Entered By: Montey Hora on 08/23/2015 18:06:01 [name] [name] ([id number]) -------------------------------------------------------------------------------- Wound Assessment Details Patient Name: [name] [name] Date of Service: 08/23/2015 1:30 PM Medical Record Number: [id number] Patient Account Number:  [id number] Date of Birth/Sex: [birth year] (82 y.o. Male) Treating RN: Montey Hora Primary Care Physician: Alvester Chou Other Clinician: Referring Physician: Alvester Chou Treating Physician/Extender: Frann Rider in Treatment: 19 Wound Status Wound Number: 2 Primary Lymphedema Etiology: Wound Location: Left Lower Leg - Circumfernential Wound Open Status: Wounding Event: Gradually Appeared Comorbid Congestive Heart Failure, Date Acquired: 12/28/2014 History: Hypertension, Type II Diabetes, Weeks Of Treatment: 19 Neuropathy Clustered Wound: Yes Wound Measurements Length: (cm) 15 Width: (cm) 30 Depth: (cm) 0.1 Area: (cm) 353.429 Volume: (cm) 35.343 % Reduction in Area: -33.9% % Reduction in Volume: -33.9% Epithelialization: None Tunneling: No Undermining: No Wound Description Classification: Partial Thickness Diabetic Severity (Wagner): Grade 1 Wound Margin: Flat and Intact Exudate Amount: Large Exudate Type: Purulent Exudate Color: yellow, brown, gree Foul Odor After Cleansing: Yes Due to Product Use: No n Wound Bed Granulation Amount: Medium (34-66%) Exposed Structure Granulation Quality: Pink Fascia Exposed: No Necrotic Amount: Medium (34-66%) Fat Layer Exposed: No Necrotic Quality: Adherent Slough Tendon Exposed: No Muscle Exposed: No Joint Exposed: No Bone Exposed: No Limited to Skin Breakdown Periwound Skin Texture Texture Color No Abnormalities Noted: No No Abnormalities Noted: No Callus: No Atrophie Blanche: No Crepitus: No Cyanosis: No [name] [name], [name] [name] ([id number]) Excoriation: No Ecchymosis: No Fluctuance: No Erythema: No Friable: No Hemosiderin Staining: No Induration: No Mottled: No Localized Edema: No Pallor: No Rash: No Rubor: No Scarring: No Temperature / Pain Moisture Temperature: No Abnormality No Abnormalities Noted: No Dry / Scaly: No Maceration: Yes Moist: Yes Wound Preparation Topical Anesthetic Applied:  None Treatment Notes Wound #2 (Left, Circumferential Lower Leg) 1. Cleansed with: Clean wound with Normal Saline 4. Dressing Applied: Aquacel Ag Other dressing (specify in notes) 5. Secondary Dressing Applied ABD Pad 7. Secured with [employer] Bilaterally Notes xtrasorb Engineer, maintenance) Signed: 08/23/2015 6:06:39 PM By: Montey Hora Entered By: Montey Hora on 08/23/2015 18:06:39 [name] [name] ([id number]) -------------------------------------------------------------------------------- Wound Assessment Details Patient Name: [name] [name] Date of Service: 08/23/2015 1:30 PM Medical Record Number: [id number] Patient Account Number: [id number] Date of Birth/Sex: Sep 12, 1926 (82 y.o. Male) Treating RN: Montey Hora Primary Care Physician: Alvester Chou Other Clinician: Referring Physician: Alvester Chou Treating Physician/Extender: Frann Rider in Treatment: 19 Wound Status Wound Number: 3 Primary Lymphedema Etiology: Wound Location: Left Foot - Plantar Wound Open Wounding Event: Gradually Appeared Status: Date Acquired: 01/27/2015 Comorbid Congestive Heart Failure, Weeks Of Treatment: 19 History: Hypertension, Type II Diabetes, Clustered Wound: No Neuropathy Wound Measurements Length: (cm) 0.3 Width: (cm) 1 Depth: (cm) 0.1 Area: (cm) 0.236 Volume: (cm) 0.024 % Reduction in Area: 96.9% % Reduction in Volume: 96.8% Epithelialization: None Tunneling: No Undermining: No Wound Description Classification: Partial Thickness Diabetic Severity Earleen Newport): Grade 1 Wound Margin: Flat and Intact Exudate Amount: Large  Exudate Type: Purulent Exudate Color: yellow, brown, gree Foul Odor After Cleansing: Yes Due to Product Use: No n Wound Bed Granulation Amount: Medium (34-66%) Exposed Structure Granulation Quality: Pink Fascia Exposed: No Necrotic Amount: Medium (34-66%) Fat Layer Exposed: No Necrotic Quality: Adherent Slough Tendon Exposed: No Muscle  Exposed: No Joint Exposed: No Bone Exposed: No Limited to Skin Breakdown Periwound Skin Texture Texture Color No Abnormalities Noted: No No Abnormalities Noted: No Callus: No Atrophie Blanche: No Crepitus: No Cyanosis: No [name] [name], [name] [name] ([id number]) Excoriation: No Ecchymosis: No Fluctuance: No Erythema: No Friable: No Hemosiderin Staining: No Induration: No Mottled: No Localized Edema: No Pallor: No Rash: No Rubor: No Scarring: No Temperature / Pain Moisture Temperature: No Abnormality No Abnormalities Noted: No Dry / Scaly: No Maceration: Yes Moist: Yes Wound Preparation Ulcer Cleansing: Rinsed/Irrigated with Saline Topical Anesthetic Applied: None Treatment Notes Wound #3 (Left, Plantar Foot) 1. Cleansed with: Clean wound with Normal Saline 4. Dressing Applied: Aquacel Ag Other dressing (specify in notes) 5. Secondary Dressing Applied ABD Pad 7. Secured with [employer] Bilaterally Notes xtrasorb Engineer, maintenance) Signed: 08/23/2015 6:07:11 PM By: Montey Hora Entered By: Montey Hora on 08/23/2015 18:07:11

## 2015-08-27 ENCOUNTER — Encounter: Payer: Medicare Other | Attending: Surgery | Admitting: Surgery

## 2015-08-27 DIAGNOSIS — I890 Lymphedema, not elsewhere classified: Secondary | ICD-10-CM | POA: Insufficient documentation

## 2015-08-27 DIAGNOSIS — L97821 Non-pressure chronic ulcer of other part of left lower leg limited to breakdown of skin: Secondary | ICD-10-CM | POA: Insufficient documentation

## 2015-08-27 DIAGNOSIS — E1140 Type 2 diabetes mellitus with diabetic neuropathy, unspecified: Secondary | ICD-10-CM | POA: Insufficient documentation

## 2015-08-27 DIAGNOSIS — I87332 Chronic venous hypertension (idiopathic) with ulcer and inflammation of left lower extremity: Secondary | ICD-10-CM | POA: Insufficient documentation

## 2015-08-27 DIAGNOSIS — I70238 Atherosclerosis of native arteries of right leg with ulceration of other part of lower right leg: Secondary | ICD-10-CM | POA: Insufficient documentation

## 2015-08-27 DIAGNOSIS — I87331 Chronic venous hypertension (idiopathic) with ulcer and inflammation of right lower extremity: Secondary | ICD-10-CM | POA: Insufficient documentation

## 2015-08-27 DIAGNOSIS — E11622 Type 2 diabetes mellitus with other skin ulcer: Secondary | ICD-10-CM | POA: Insufficient documentation

## 2015-08-27 DIAGNOSIS — F0390 Unspecified dementia without behavioral disturbance: Secondary | ICD-10-CM | POA: Insufficient documentation

## 2015-08-27 DIAGNOSIS — I70248 Atherosclerosis of native arteries of left leg with ulceration of other part of lower left leg: Secondary | ICD-10-CM | POA: Diagnosis not present

## 2015-08-27 DIAGNOSIS — I129 Hypertensive chronic kidney disease with stage 1 through stage 4 chronic kidney disease, or unspecified chronic kidney disease: Secondary | ICD-10-CM | POA: Insufficient documentation

## 2015-08-27 DIAGNOSIS — N189 Chronic kidney disease, unspecified: Secondary | ICD-10-CM | POA: Insufficient documentation

## 2015-08-27 DIAGNOSIS — L97811 Non-pressure chronic ulcer of other part of right lower leg limited to breakdown of skin: Secondary | ICD-10-CM | POA: Insufficient documentation

## 2015-08-27 NOTE — Progress Notes (Signed)
[name] [name] ([id number]) Visit Report for 08/27/2015 Chief Complaint Document Details Patient Name: [name] [name], [name] [name] Date of Service: 08/27/2015 2:30 PM Medical Record Number: [id number] Patient Account Number: [id number] Date of Birth/Sex: 20-Jun-1926 (82 y.o. Male) Treating RN: Macarthur Critchley Primary Care Physician: Alvester Chou Other Clinician: Referring Physician: Alvester Chou Treating Physician/Extender: Frann Rider in Treatment: 20 Information Obtained from: Patient Chief Complaint Patient presents to the wound care center for a consult due non healing wound both lower extremities. The patient is a poor historian and was seen here once in July 2016. He says home health was doing his dressings and he was healed but now has recurrence of the problem. Problem has been there at least for 6 months. Electronic Signature(s) Signed: 08/27/2015 3:15:30 PM By: Christin Fudge MD, FACS Entered By: Christin Fudge on 08/27/2015 15:15:30 [name] [name] ([id number]) -------------------------------------------------------------------------------- HPI Details Patient Name: [name] [name] Date of Service: 08/27/2015 2:30 PM Medical Record Number: [id number] Patient Account Number: [id number] Date of Birth/Sex: 25-Jun-1926 (82 y.o. Male) Treating RN: Macarthur Critchley Primary Care Physician: Alvester Chou Other Clinician: Referring Physician: Alvester Chou Treating Physician/Extender: Frann Rider in Treatment: 20 History of Present Illness Location: swelling of the legs right more than left an ulceration both lower extremities Quality: Patient reports experiencing a dull pain to affected area(s). Severity: Patient states wound (s) are getting worse Duration: Patient has had the wound for > 6 months prior to seeking treatment at the wound center Timing: Pain in wound is Intermittent. Context: The wound appeared gradually over time Modifying Factors: he has been having home health come and apply his  dressing daily was healed. Associated Signs and Symptoms: Patient reports having difficulty standing for long periods. HPI Description: [name] [name] is a 82 y.o. male with history of DM, hypertension, chronic kidney disease, gout, diastolic dysfunction and the patient was having increasing swelling with increasing fluid draining from the legs and pain over the last 6 weeks. He says his wounds are completely healed and the home health had stopped coming to do his dressings. Recent hemoglobin A1c was 6.6. During the last admission A venous duplex study revealed no evidence of deep vein thrombosis or superficial thrombosis involving the right and left lower extremity. Was seen in January 2016 by Dr. Adele Barthel at [facility]: Non-Invasive Vascular Imaging ABI (05/05/2014) o R: 0.65 (0.73), DP: mono, PT: mono, TBI: 0.57 o L: 0.65 (0.69), DP: mono, PT: mono, TBI: 0.36 The patient had the diagnosis of bilateral lower extremity moderate peripheral atherosclerotic disease, bilateral lower extremity chronic venous insufficiency. Due to the patient's age and mental status conservative therapy was only discussed and no surgical intervention was planned. The patient has significant dementia and has a caregiver who spends a few hours every day coming to see him. He does not have any family members. 05/21/2015 -- lower extremity arterial studies done on 05/17/2015 show the right ABI is 0.50-0.79 indicating moderate arterial occlusive disease at rest. The left ABI was 0.5-0.79 indicating moderate arterial occlusive disease at rest. The left posterior tibial and peroneal arteries were not evaluated due to local wound condition. The right TBI was less than 0.69 and the left ABI was less than 0.69 which are both abnormal. Since it's been a year since the previous opinion from Dr. Bridgett Larsson I have asked him to see him for a review. 05/28/2015 -- was seen by Dr. Bridgett Larsson and he recommended that he was not a good candidate for  surgery and surgical revascularization if the need develops. He  recommends to continue with compressive therapy and wound care. 06/04/2015 -- had asked him to remove his compression wraps have a shower and come to see as but he has probably been scratching his legs and caused a lot of excoriation and has multiple open ulcers which are much more worse than before. The patient is not very compliant and may be this is because of his [name] [name], [name] [name] ([id number]) dementia. 08/06/2015 -- his hygiene continues to be very bad and he has a lot of drainage from his wounds and is not happy about his home health nurses and would like to change them. Electronic Signature(s) Signed: 08/27/2015 3:15:35 PM By: Christin Fudge MD, FACS Entered By: Christin Fudge on 08/27/2015 15:15:35 [name] [name] ([id number]) -------------------------------------------------------------------------------- Physical Exam Details Patient Name: [name] [name] Date of Service: 08/27/2015 2:30 PM Medical Record Number: [id number] Patient Account Number: [id number] Date of Birth/Sex: 05/18/1926 (82 y.o. Male) Treating RN: Macarthur Critchley Primary Care Physician: Alvester Chou Other Clinician: Referring Physician: Alvester Chou Treating Physician/Extender: Frann Rider in Treatment: 20 Constitutional . Pulse regular. Respirations normal and unlabored. Afebrile. . Eyes Nonicteric. Reactive to light. Ears, Nose, Mouth, and Throat Lips, teeth, and gums WNL.Marland Kitchen Moist mucosa without lesions. Neck supple and nontender. No palpable supraclavicular or cervical adenopathy. Normal sized without goiter. Respiratory WNL. No retractions.. Cardiovascular Pedal Pulses WNL. No clubbing, cyanosis or edema. Lymphatic No adneopathy. No adenopathy. No adenopathy. Musculoskeletal Adexa without tenderness or enlargement.. Digits and nails w/o clubbing, cyanosis, infection, petechiae, ischemia, or inflammatory conditions.. Integumentary (Hair,  Skin) No suspicious lesions. No crepitus or fluctuance. No peri-wound warmth or erythema. No masses.Marland Kitchen Psychiatric Judgement and insight Intact.. No evidence of depression, anxiety, or agitation.. Notes the left lateral lower third of the leg and the right lateral lower third of the leg continued to have significant ulceration which has been much worse than it was before. He continues to have stage II lymphedema and his hygiene is extremely poor. Electronic Signature(s) Signed: 08/27/2015 3:16:17 PM By: Christin Fudge MD, FACS Entered By: Christin Fudge on 08/27/2015 15:16:16 [name] [name] ([id number]) -------------------------------------------------------------------------------- Physician Orders Details Patient Name: [name] [name] Date of Service: 08/27/2015 2:30 PM Medical Record Number: [id number] Patient Account Number: [id number] Date of Birth/Sex: [birth year] (82 y.o. Male) Treating RN: Macarthur Critchley Primary Care Physician: Alvester Chou Other Clinician: Referring Physician: Alvester Chou Treating Physician/Extender: Frann Rider in Treatment: 20 Verbal / Phone Orders: Yes Clinician: Macarthur Critchley Read Back and Verified: Yes Diagnosis Coding Wound Cleansing Wound #1 Right,Circumferential Lower Leg o May shower with protection. Wound #2 Left,Circumferential Lower Leg o May shower with protection. Primary Wound Dressing Wound #1 Right,Circumferential Lower Leg o Aquacel Ag Wound #2 Left,Circumferential Lower Leg o Aquacel Ag Wound #3 Left,Plantar Foot o Aquacel Ag Secondary Dressing Wound #1 Right,Circumferential Lower Leg o ABD pad o Dry Gauze o Drawtex Wound #2 Left,Circumferential Lower Leg o ABD pad o Dry Gauze o Drawtex Wound #3 Left,Plantar Foot o ABD pad o Dry Gauze o Drawtex Dressing Change Frequency Wound #1 Right,Circumferential Lower Leg o Dressing is to be changed Monday and Thursday. MOSHEH, EYMARD ([id number]) Wound #2  Left,Circumferential Lower Leg o Dressing is to be changed Monday and Thursday. Wound #3 Left,Plantar Foot o Dressing is to be changed Monday and Thursday. Follow-up Appointments Wound #1 Right,Circumferential Lower Leg o Return Appointment in 1 week. o Nurse Visit as needed - Thursday May 4 Wound #2 Left,Circumferential Lower Leg o Return Appointment in 1 week. o Nurse Visit as needed - Thursday May 4  Wound #3 Left,Plantar Foot o Return Appointment in 1 week. o Nurse Visit as needed - Thursday May 4 Edema Control Wound #1 Right,Circumferential Lower Leg o Unna Boots Bilaterally Wound #2 Left,Circumferential Lower Leg o Unna Boots Bilaterally Wound #3 Left,Plantar Foot o Unna Boots Bilaterally Home Health Wound #1 Right,Circumferential Lower Leg o [place] Visits - hold [facility] visits this week, patient has issues at home. will come to clinic for nurse visit Wound #2 Left,Circumferential Lower Leg o [place] Visits - hold [facility] visits this week, patient has issues at home. will come to clinic for nurse visit Wound #3 [place] Visits - hold [facility] visits this week, patient has issues at home. will come to clinic for nurse visit Medications-please add to medication list. Wound #1 Right,Circumferential Lower Leg o P.O. Antibiotics - doxycycline Wound #2 Left,Circumferential Lower Leg Grace, Ardean ([id number]) o P.O. Antibiotics - doxycycline Wound #3 Left,Plantar Foot o P.O. Antibiotics - doxycycline Electronic Signature(s) Signed: 08/27/2015 4:16:10 PM By: Christin Fudge MD, FACS Signed: 08/27/2015 4:34:19 PM By: Rebecca Eaton RN, Romero Liner By: Rebecca Eaton RN, [place]ra on 08/27/2015 15:14:12 [name] [name] ([id number]) -------------------------------------------------------------------------------- Problem List Details Patient Name: [name] [name] Date of Service: 08/27/2015 2:30 PM Medical Record Number:  [id number] Patient Account Number: [id number] Date of Birth/Sex: [birth year] (82 y.o. Male) Treating RN: Macarthur Critchley Primary Care Physician: Alvester Chou Other Clinician: Referring Physician: Alvester Chou Treating Physician/Extender: Frann Rider in Treatment: 20 Active Problems ICD-10 Encounter Code Description Active Date Diagnosis E11.622 Type 2 diabetes mellitus with other skin ulcer 04/09/2015 Yes I89.0 Lymphedema, not elsewhere classified 04/09/2015 Yes I70.238 Atherosclerosis of native arteries of right leg with 04/09/2015 Yes ulceration of other part of lower right leg I70.248 Atherosclerosis of native arteries of left leg with ulceration 04/09/2015 Yes of other part of lower left leg I87.331 Chronic venous hypertension (idiopathic) with ulcer and 04/09/2015 Yes inflammation of right lower extremity I87.332 Chronic venous hypertension (idiopathic) with ulcer and 04/09/2015 Yes inflammation of left lower extremity Inactive Problems Resolved Problems Electronic Signature(s) Signed: 08/27/2015 3:15:21 PM By: Christin Fudge MD, FACS Entered By: Christin Fudge on 08/27/2015 15:15:21 [name] [name] ([id number]) -------------------------------------------------------------------------------- Progress Note Details Patient Name: [name] [name] Date of Service: 08/27/2015 2:30 PM Medical Record Number: [id number] Patient Account Number: [id number] Date of Birth/Sex: 1927/01/14 (82 y.o. Male) Treating RN: Macarthur Critchley Primary Care Physician: Alvester Chou Other Clinician: Referring Physician: Alvester Chou Treating Physician/Extender: Frann Rider in Treatment: 20 Subjective Chief Complaint Information obtained from Patient Patient presents to the wound care center for a consult due non healing wound both lower extremities. The patient is a poor historian and was seen here once in July 2016. He says home health was doing his dressings and he was healed but now has recurrence  of the problem. Problem has been there at least for 6 months. History of Present Illness (HPI) The following HPI elements were documented for the patient's wound: Location: swelling of the legs right more than left an ulceration both lower extremities Quality: Patient reports experiencing a dull pain to affected area(s). Severity: Patient states wound (s) are getting worse Duration: Patient has had the wound for > 6 months prior to seeking treatment at the wound center Timing: Pain in wound is Intermittent. Context: The wound appeared gradually over time Modifying Factors: he has been having home health come and apply his dressing daily was healed. Associated Signs and Symptoms: Patient reports having difficulty standing for long periods. [name] [name] is  a 82 y.o. male with history of DM, hypertension, chronic kidney disease, gout, diastolic dysfunction and the patient was having increasing swelling with increasing fluid draining from the legs and pain over the last 6 weeks. He says his wounds are completely healed and the home health had stopped coming to do his dressings. Recent hemoglobin A1c was 6.6. During the last admission A venous duplex study revealed no evidence of deep vein thrombosis or superficial thrombosis involving the right and left lower extremity. Was seen in January 2016 by Dr. Adele Barthel at [facility]: Non-Invasive Vascular Imaging ABI (05/05/2014)  R: 0.65 (0.73), DP: mono, PT: mono, TBI: 0.57  L: 0.65 (0.69), DP: mono, PT: mono, TBI: 0.36 The patient had the diagnosis of bilateral lower extremity moderate peripheral atherosclerotic disease, bilateral lower extremity chronic venous insufficiency. Due to the patient's age and mental status conservative therapy was only discussed and no surgical intervention was planned. The patient has significant dementia and has a caregiver who spends a few hours every day coming to see him. He does not have any family  members. 05/21/2015 -- lower extremity arterial studies done on 05/17/2015 show the right ABI is 0.50-0.79 indicating moderate arterial occlusive disease at rest. The left ABI was 0.5-0.79 indicating moderate arterial occlusive disease at rest. The left posterior tibial and peroneal arteries were not evaluated due to local wound [name] [name], [name] [name] ([id number]) condition. The right TBI was less than 0.69 and the left ABI was less than 0.69 which are both abnormal. Since it's been a year since the previous opinion from Dr. Bridgett Larsson I have asked him to see him for a review. 05/28/2015 -- was seen by Dr. Bridgett Larsson and he recommended that he was not a good candidate for surgery and surgical revascularization if the need develops. He recommends to continue with compressive therapy and wound care. 06/04/2015 -- had asked him to remove his compression wraps have a shower and come to see as but he has probably been scratching his legs and caused a lot of excoriation and has multiple open ulcers which are much more worse than before. The patient is not very compliant and may be this is because of his dementia. 08/06/2015 -- his hygiene continues to be very bad and he has a lot of drainage from his wounds and is not happy about his home health nurses and would like to change them. Objective Constitutional Pulse regular. Respirations normal and unlabored. Afebrile. Vitals Time Taken: 2:50 PM, Height: 70 in, Weight: 230 lbs, BMI: 33, Temperature: 97.9 F, Pulse: 75 bpm, Respiratory Rate: 18 breaths/min, Blood Pressure: 119/53 mmHg. Eyes Nonicteric. Reactive to light. Ears, Nose, Mouth, and Throat Lips, teeth, and gums WNL.Marland Kitchen Moist mucosa without lesions. Neck supple and nontender. No palpable supraclavicular or cervical adenopathy. Normal sized without goiter. Respiratory WNL. No retractions.. Cardiovascular Pedal Pulses WNL. No clubbing, cyanosis or edema. Lymphatic No adneopathy. No adenopathy. No  adenopathy. Musculoskeletal Adexa without tenderness or enlargement.. Digits and nails w/o clubbing, cyanosis, infection, petechiae, ischemia, or inflammatory conditions.[name] [name], [name] [name] ([id number]) Psychiatric Judgement and insight Intact.. No evidence of depression, anxiety, or agitation.. General Notes: the left lateral lower third of the leg and the right lateral lower third of the leg continued to have significant ulceration which has been much worse than it was before. He continues to have stage II lymphedema and his hygiene is extremely poor. Integumentary (Hair, Skin) No suspicious lesions. No crepitus or fluctuance. No peri-wound warmth or erythema. No masses.. Wound #1 status is Open. Original  cause of wound was Gradually Appeared. The wound is located on the Right,Circumferential Lower Leg. The wound measures 18cm length x 18cm width x 0.1cm depth; 254.469cm^2 area and 25.447cm^3 volume. The wound is limited to skin breakdown. There is no tunneling or undermining noted. There is a large amount of purulent drainage noted. The wound margin is flat and intact. There is medium (34-66%) pink granulation within the wound bed. There is a medium (34-66%) amount of necrotic tissue within the wound bed including Adherent Slough. The periwound skin appearance exhibited: Maceration, Moist. The periwound skin appearance did not exhibit: Callus, Crepitus, Excoriation, Fluctuance, Friable, Induration, Localized Edema, Rash, Scarring, Dry/Scaly, Atrophie Blanche, Cyanosis, Ecchymosis, Hemosiderin Staining, Mottled, Pallor, Rubor, Erythema. Periwound temperature was noted as No Abnormality. Wound #2 status is Open. Original cause of wound was Gradually Appeared. The wound is located on the Left,Circumferential Lower Leg. The wound measures 12cm length x 26cm width x 0.1cm depth; 245.044cm^2 area and 24.504cm^3 volume. The wound is limited to skin breakdown. There is no tunneling or undermining  noted. There is a large amount of purulent drainage noted. The wound margin is flat and intact. There is medium (34-66%) pink granulation within the wound bed. There is a medium (34-66%) amount of necrotic tissue within the wound bed including Adherent Slough. The periwound skin appearance exhibited: Maceration, Moist. The periwound skin appearance did not exhibit: Callus, Crepitus, Excoriation, Fluctuance, Friable, Induration, Localized Edema, Rash, Scarring, Dry/Scaly, Atrophie Blanche, Cyanosis, Ecchymosis, Hemosiderin Staining, Mottled, Pallor, Rubor, Erythema. Periwound temperature was noted as No Abnormality. Wound #3 status is Open. Original cause of wound was Gradually Appeared. The wound is located on the [place]. The wound measures 0.5cm length x 1.5cm width x 0.1cm depth; 0.589cm^2 area and 0.059cm^3 volume. The wound is limited to skin breakdown. There is a large amount of purulent drainage noted. The wound margin is flat and intact. There is large (67-100%) pink granulation within the wound bed. There is a small (1-33%) amount of necrotic tissue within the wound bed including Adherent Slough. The periwound skin appearance exhibited: Maceration, Moist. The periwound skin appearance did not exhibit: Callus, Crepitus, Excoriation, Fluctuance, Friable, Induration, Localized Edema, Rash, Scarring, Dry/Scaly, Atrophie Blanche, Cyanosis, Ecchymosis, Hemosiderin Staining, Mottled, Pallor, Rubor, Erythema. Periwound temperature was noted as No Abnormality. Assessment [name] [name], [name] [name] ([id number]) Active Problems ICD-10 E11.622 - Type 2 diabetes mellitus with other skin ulcer I89.0 - Lymphedema, not elsewhere classified I70.238 - Atherosclerosis of native arteries of right leg with ulceration of other part of lower right leg I70.248 - Atherosclerosis of native arteries of left leg with ulceration of other part of lower left leg I87.331 - Chronic venous hypertension (idiopathic) with  ulcer and inflammation of right lower extremity I87.332 - Chronic venous hypertension (idiopathic) with ulcer and inflammation of left lower extremity Plan Wound Cleansing: Wound #1 Right,Circumferential Lower Leg: May shower with protection. Wound #2 Left,Circumferential Lower Leg: May shower with protection. Primary Wound Dressing: Wound #1 Right,Circumferential Lower Leg: Aquacel Ag Wound #2 Left,Circumferential Lower Leg: Aquacel Ag Wound #3 Left,Plantar Foot: Aquacel Ag Secondary Dressing: Wound #1 Right,Circumferential Lower Leg: ABD pad Dry Gauze Drawtex Wound #2 Left,Circumferential Lower Leg: ABD pad Dry Gauze Drawtex Wound #3 Left,Plantar Foot: ABD pad Dry Gauze Drawtex Dressing Change Frequency: Wound #1 Right,Circumferential Lower Leg: Dressing is to be changed Monday and Thursday. Wound #2 Left,Circumferential Lower Leg: Dressing is to be changed Monday and Thursday. Wound #3 Left,Plantar Foot: Dressing is to be changed Monday and Thursday. Follow-up Appointments: [name] [name] (  [id number]) Wound #1 Right,Circumferential Lower Leg: Return Appointment in 1 week. Nurse Visit as needed - Thursday May 4 Wound #2 Left,Circumferential Lower Leg: Return Appointment in 1 week. Nurse Visit as needed - Thursday May 4 Wound #3 Left,Plantar Foot: Return Appointment in 1 week. Nurse Visit as needed - Thursday May 4 Edema Control: Wound #1 Right,Circumferential Lower Leg: Unna Boots Bilaterally Wound #2 Left,Circumferential Lower Leg: Unna Boots Bilaterally Wound #3 Left,Plantar Foot: Retail buyer Bilaterally Home Health: Wound #1 Right,Circumferential Lower Leg: Continue Home Health Visits - hold [facility] visits this week, patient has issues at home. will come to clinic for nurse visit Wound #2 Left,Circumferential Lower Leg: [place] Visits - hold [facility] visits this week, patient has issues at home. will come to clinic for nurse visit Wound #3 Left,Plantar  Foot: [place] Visits - hold [facility] visits this week, patient has issues at home. will come to clinic for nurse visit Medications-please add to medication list.: Wound #1 Right,Circumferential Lower Leg: P.O. Antibiotics - doxycycline Wound #2 Left,Circumferential Lower Leg: P.O. Antibiotics - doxycycline Wound #3 Left,Plantar Foot: P.O. Antibiotics - doxycycline I have recommended we changed to twice a week dressings with him coming back for a nurse visit on Thursday. We will continue to use Aquacel Ag and Unna's boots. His hygiene is continuing to be very poor and I believe there may be significant investigation of bedbugs and roaches at his home. Electronic Signature(s) Signed: 08/27/2015 3:17:17 PM By: Christin Fudge MD, FACS Entered By: Christin Fudge on 08/27/2015 15:17:17 [name] [name] ([id number]) -------------------------------------------------------------------------------- SuperBill Details Patient Name: [name] [name] Date of Service: 08/27/2015 Medical Record Number: [id number] Patient Account Number: [id number] Date of Birth/Sex: 1926/10/09 (82 y.o. Male) Treating RN: Macarthur Critchley Primary Care Physician: Alvester Chou Other Clinician: Referring Physician: Alvester Chou Treating Physician/Extender: Frann Rider in Treatment: 20 Diagnosis Coding ICD-10 Codes Code Description E11.622 Type 2 diabetes mellitus with other skin ulcer I89.0 Lymphedema, not elsewhere classified I70.238 Atherosclerosis of native arteries of right leg with ulceration of other part of lower right leg I70.248 Atherosclerosis of native arteries of left leg with ulceration of other part of lower left leg Chronic venous hypertension (idiopathic) with ulcer and inflammation of right lower I87.331 extremity Chronic venous hypertension (idiopathic) with ulcer and inflammation of left lower I87.332 extremity Facility Procedures CPT4 Code: [id number] Description: 29580 - APPLY UNNA BOOT/PROFO  BILATERAL Modifier: Quantity: 1 Physician Procedures CPT4: Description Modifier Quantity Code [id number] - WC PHYS LEVEL 3 - EST PT 1 ICD-10 Description Diagnosis E11.622 Type 2 diabetes mellitus with other skin ulcer I89.0 Lymphedema, not elsewhere classified I87.331 Chronic venous hypertension  (idiopathic) with ulcer and inflammation of right lower extremity I70.238 Atherosclerosis of native arteries of right leg with ulceration of other part of lower right leg Electronic Signature(s) Signed: 08/27/2015 4:34:19 PM By: Rebecca Eaton RN, Roslynn Amble Previous Signature: 08/27/2015 3:17:33 PM Version By: Christin Fudge MD, FACS Entered By: Rebecca Eaton RN, Roslynn Amble on 08/27/2015 16:29:27

## 2015-08-27 NOTE — Progress Notes (Signed)
[name] [name], [name] [name] ([id number]) Visit Report for 08/27/2015 Arrival Information Details Patient Name: [name] [name], [name] [name] Date of Service: 08/27/2015 2:30 PM Medical Record Number: [id number] Patient Account Number: [id number] Date of Birth/Sex: 1926/05/13 (82 y.o. Male) Treating RN: Cornell Barman Primary Care Physician: Alvester Chou Other Clinician: Referring Physician: Alvester Chou Treating Physician/Extender: Frann Rider in Treatment: 78 Visit Information History Since Last Visit Added or deleted any medications: No Patient Arrived: [name] [name] Any new allergies or adverse reactions: No Arrival Time: 14:47 Had a fall or experienced change in No Accompanied By: caregiver activities of daily living that may affect Transfer Assistance: None risk of falls: Patient Identification Verified: Yes Signs or symptoms of abuse/neglect since last No Secondary Verification Process Yes visito Completed: Has Dressing in Place as Prescribed: Yes Patient Has Alerts: Yes Has Compression in Place as Prescribed: Yes Pain Present Now: No Electronic Signature(s) Signed: 08/27/2015 3:54:28 PM By: Gretta Cool, RN, BSN, Kim RN, BSN Entered By: Gretta Cool, RN, BSN, Kim on 08/27/2015 14:47:28 [name] [name] ([id number]) -------------------------------------------------------------------------------- Encounter Discharge Information Details Patient Name: [name] [name] Date of Service: 08/27/2015 2:30 PM Medical Record Number: [id number] Patient Account Number: [id number] Date of Birth/Sex: 02/16/1927 (82 y.o. Male) Treating RN: Macarthur Critchley Primary Care Physician: Alvester Chou Other Clinician: Referring Physician: Alvester Chou Treating Physician/Extender: Frann Rider in Treatment: 20 Encounter Discharge Information Items Discharge Pain Level: 0 Discharge Condition: Stable Ambulatory Status: Walker Discharge Destination: Home Transportation: Private Auto Accompanied By: caregiver Schedule Follow-up Appointment:  Yes Medication Reconciliation completed Yes and provided to Patient/Care [name] [name]: Provided on Clinical Summary of Care: 08/27/2015 Form Type Recipient Paper Patient KL Electronic Signature(s) Signed: 08/27/2015 3:54:28 PM By: Gretta Cool, RN, BSN, Kim RN, BSN Previous Signature: 08/27/2015 3:31:28 PM Version By: Ruthine Dose Entered By: Gretta Cool RN, BSN, Kim on 08/27/2015 15:32:45 [name] [name] ([id number]) -------------------------------------------------------------------------------- Lower Extremity Assessment Details Patient Name: [name] [name] Date of Service: 08/27/2015 2:30 PM Medical Record Number: [id number] Patient Account Number: [id number] Date of Birth/Sex: Apr 17, 1927 (82 y.o. Male) Treating RN: Cornell Barman Primary Care Physician: Alvester Chou Other Clinician: Referring Physician: Alvester Chou Treating Physician/Extender: Frann Rider in Treatment: 20 Edema Assessment Assessed: [Left: No] [Right: No] E[Left: dema] [Right: :] Calf Left: Right: Point of Measurement: 32 cm From Medial Instep 42.5 cm 41.5 cm Ankle Left: Right: Point of Measurement: 12 cm From Medial Instep 29.6 cm 27 cm Vascular Assessment Pulses: Posterior Tibial Dorsalis Pedis Palpable: [Left:Yes] [Right:Yes] Extremity colors, hair growth, and conditions: Extremity Color: [Left:Hyperpigmented] [Right:Hyperpigmented] Hair Growth on Extremity: [Left:No] [Right:No] Temperature of Extremity: [Left:Warm] [Right:Warm] Capillary Refill: [Left:< 3 seconds] [Right:< 3 seconds] Toe Nail Assessment Left: Right: Thick: Yes Yes Discolored: No No Deformed: No No Improper Length and Hygiene: Yes Yes Electronic Signature(s) Signed: 08/27/2015 3:54:28 PM By: Gretta Cool, RN, BSN, Kim RN, BSN Entered By: Gretta Cool, RN, BSN, Kim on 08/27/2015 15:02:05 [name] [name] ([id number]) -------------------------------------------------------------------------------- Pain Assessment Details Patient Name: [name] [name] Date of Service:  08/27/2015 2:30 PM Medical Record Number: [id number] Patient Account Number: [id number] Date of Birth/Sex: Nov 17, 1926 (82 y.o. Male) Treating RN: Cornell Barman Primary Care Physician: Alvester Chou Other Clinician: Referring Physician: Alvester Chou Treating Physician/Extender: Frann Rider in Treatment: 20 Active Problems Location of Pain Severity and Description of Pain Patient Has Paino No Site Locations Pain Management and Medication Current Pain Management: Electronic Signature(s) Signed: 08/27/2015 3:54:28 PM By: Gretta Cool, RN, BSN, Kim RN, BSN Entered By: Gretta Cool, RN, BSN, Kim on 08/27/2015 14:50:26 [name] [name] ([id number]) -------------------------------------------------------------------------------- Patient/Caregiver Education Details Patient Name: [name] [name] Date of Service: 08/27/2015  2:30 PM Medical Record Number: [id number] Patient Account Number: [id number] Date of Birth/Gender: [birth year] (82 y.o. Male) Treating RN: Cornell Barman Primary Care Physician: Alvester Chou Other Clinician: Referring Physician: Alvester Chou Treating Physician/Extender: Frann Rider in Treatment: 20 Education Assessment Education Provided To: Patient Education Topics Provided Wound/Skin Impairment: Caring for Your Ulcer, Other: do not remove wraps to shower. we will wash legs at your Handouts: wound care visits Methods: Demonstration, Explain/Verbal Responses: State content correctly Notes patient to transfer care to [place] office 5/168/2016 @ 8am Electronic Signature(s) Signed: 08/27/2015 3:54:28 PM By: Gretta Cool, RN, BSN, Kim RN, BSN Entered By: Gretta Cool, RN, BSN, Kim on 08/27/2015 15:34:05 [name] [name] ([id number]) -------------------------------------------------------------------------------- Wound Assessment Details Patient Name: [name] [name] Date of Service: 08/27/2015 2:30 PM Medical Record Number: [id number] Patient Account Number: [id number] Date of Birth/Sex: 01/05/1927 (82 y.o.  Male) Treating RN: Cornell Barman Primary Care Physician: Alvester Chou Other Clinician: Referring Physician: Alvester Chou Treating Physician/Extender: Frann Rider in Treatment: 20 Wound Status Wound Number: 1 Primary Lymphedema Etiology: Wound Location: Right Lower Leg - Circumfernential Wound Open Status: Wounding Event: Gradually Appeared Comorbid Congestive Heart Failure, Date Acquired: 12/28/2014 History: Hypertension, Type II Diabetes, Weeks Of Treatment: 20 Neuropathy Clustered Wound: Yes Photos Photo Uploaded By: Gretta Cool, RN, BSN, Kim on 08/27/2015 15:53:04 Wound Measurements Length: (cm) 18 Width: (cm) 18 Depth: (cm) 0.1 Area: (cm) 254.469 Volume: (cm) 25.447 % Reduction in Area: 34.4% % Reduction in Volume: 34.4% Epithelialization: Medium (34-66%) Tunneling: No Undermining: No Wound Description Classification: Partial Thickness Diabetic Severity (Wagner): Grade 1 Wound Margin: Flat and Intact Exudate Amount: Large Exudate Type: Purulent Exudate Color: yellow, brown, gree Foul Odor After Cleansing: Yes Due to Product Use: No n Wound Bed Granulation Amount: Medium (34-66%) Exposed Structure Granulation Quality: Pink Fascia Exposed: No Enck, Sayeed ([id number]) Necrotic Amount: Medium (34-66%) Fat Layer Exposed: No Necrotic Quality: Adherent Slough Tendon Exposed: No Muscle Exposed: No Joint Exposed: No Bone Exposed: No Limited to Skin Breakdown Periwound Skin Texture Texture Color No Abnormalities Noted: No No Abnormalities Noted: No Callus: No Atrophie Blanche: No Crepitus: No Cyanosis: No Excoriation: No Ecchymosis: No Fluctuance: No Erythema: No Friable: No Hemosiderin Staining: No Induration: No Mottled: No Localized Edema: No Pallor: No Rash: No Rubor: No Scarring: No Temperature / Pain Moisture Temperature: No Abnormality No Abnormalities Noted: No Dry / Scaly: No Maceration: Yes Moist: Yes Wound Preparation Ulcer  Cleansing: Rinsed/Irrigated with Saline Topical Anesthetic Applied: None Treatment Notes Wound #1 (Right, Circumferential Lower Leg) 1. Cleansed with: Cleanse wound with antibacterial soap and water 4. Dressing Applied: Aquacel Ag 5. Secondary Dressing Applied ABD Pad 6. Footwear/Offloading device applied Surgical shoe 7. Secured with Water engineer) Signed: 08/27/2015 3:54:28 PM By: Gretta Cool, RN, BSN, Kim RN, BSN Entered By: Gretta Cool, RN, BSN, Kim on 08/27/2015 15:06:46 MADSEN, FROHN ([id number]) WINSOR, ESS ([id number]) -------------------------------------------------------------------------------- Wound Assessment Details Patient Name: [name] [name] Date of Service: 08/27/2015 2:30 PM Medical Record Number: [id number] Patient Account Number: [id number] Date of Birth/Sex: Mar 21, 1927 (82 y.o. Male) Treating RN: Cornell Barman Primary Care Physician: Alvester Chou Other Clinician: Referring Physician: Alvester Chou Treating Physician/Extender: Frann Rider in Treatment: 20 Wound Status Wound Number: 2 Primary Lymphedema Etiology: Wound Location: Left Lower Leg - Circumfernential Wound Open Status: Wounding Event: Gradually Appeared Comorbid Congestive Heart Failure, Date Acquired: 12/28/2014 History: Hypertension, Type II Diabetes, Weeks Of Treatment: 20 Neuropathy Clustered Wound: Yes Photos Photo Uploaded By: Gretta Cool, RN, BSN, Kim on 08/27/2015 15:53:04 Wound Measurements Length: (cm) 12 Width: (cm) 26  Depth: (cm) 0.1 Area: (cm) 245.044 Volume: (cm) 24.504 % Reduction in Area: 7.1% % Reduction in Volume: 7.1% Epithelialization: Medium (34-66%) Tunneling: No Undermining: No Wound Description Classification: Partial Thickness Diabetic Severity (Wagner): Grade 1 Wound Margin: Flat and Intact Exudate Amount: Large Exudate Type: Purulent Exudate Color: yellow, brown, gree Foul Odor After Cleansing: Yes Due to Product Use: No n Wound  Bed Granulation Amount: Medium (34-66%) Exposed Structure Granulation Quality: Pink Fascia Exposed: No Slavick, Ervey ([id number]) Necrotic Amount: Medium (34-66%) Fat Layer Exposed: No Necrotic Quality: Adherent Slough Tendon Exposed: No Muscle Exposed: No Joint Exposed: No Bone Exposed: No Limited to Skin Breakdown Periwound Skin Texture Texture Color No Abnormalities Noted: No No Abnormalities Noted: No Callus: No Atrophie Blanche: No Crepitus: No Cyanosis: No Excoriation: No Ecchymosis: No Fluctuance: No Erythema: No Friable: No Hemosiderin Staining: No Induration: No Mottled: No Localized Edema: No Pallor: No Rash: No Rubor: No Scarring: No Temperature / Pain Moisture Temperature: No Abnormality No Abnormalities Noted: No Dry / Scaly: No Maceration: Yes Moist: Yes Wound Preparation Ulcer Cleansing: Other: surg scrub and water, Topical Anesthetic Applied: None Treatment Notes Wound #2 (Left, Circumferential Lower Leg) 1. Cleansed with: Cleanse wound with antibacterial soap and water 4. Dressing Applied: Aquacel Ag 5. Secondary Dressing Applied ABD Pad 6. Footwear/Offloading device applied Surgical shoe 7. Secured with Water engineer) Signed: 08/27/2015 3:54:28 PM By: Gretta Cool, RN, BSN, Kim RN, BSN Entered By: Gretta Cool, RN, BSN, Kim on 08/27/2015 15:07:42 FREDERIK, HARTSOCK ([id number]) KETCHER, LOTTMAN ([id number]) -------------------------------------------------------------------------------- Wound Assessment Details Patient Name: [name] [name] Date of Service: 08/27/2015 2:30 PM Medical Record Number: [id number] Patient Account Number: [id number] Date of Birth/Sex: 02/10/1927 (82 y.o. Male) Treating RN: Cornell Barman Primary Care Physician: Alvester Chou Other Clinician: Referring Physician: Alvester Chou Treating Physician/Extender: Frann Rider in Treatment: 20 Wound Status Wound Number: 3 Primary Lymphedema Etiology: Wound  Location: Left Foot - Plantar Wound Open Wounding Event: Gradually Appeared Status: Date Acquired: 01/27/2015 Comorbid Congestive Heart Failure, Weeks Of Treatment: 20 History: Hypertension, Type II Diabetes, Clustered Wound: No Neuropathy Photos Photo Uploaded By: Gretta Cool, RN, BSN, Kim on 08/27/2015 15:53:25 Wound Measurements Length: (cm) 0.5 Width: (cm) 1.5 Depth: (cm) 0.1 Area: (cm) 0.589 Volume: (cm) 0.059 % Reduction in Area: 92.2% % Reduction in Volume: 92.2% Epithelialization: None Wound Description Classification: Partial Thickness Diabetic Severity (Wagner): Grade 1 Wound Margin: Flat and Intact Exudate Amount: Large Exudate Type: Purulent Exudate Color: yellow, brown, gree Foul Odor After Cleansing: Yes Due to Product Use: No n Wound Bed Granulation Amount: Large (67-100%) Exposed Structure Granulation Quality: Pink Fascia Exposed: No Necrotic Amount: Small (1-33%) Fat Layer Exposed: No Stopka, Luisalberto ([id number]) Necrotic Quality: Adherent Slough Tendon Exposed: No Muscle Exposed: No Joint Exposed: No Bone Exposed: No Limited to Skin Breakdown Periwound Skin Texture Texture Color No Abnormalities Noted: No No Abnormalities Noted: No Callus: No Atrophie Blanche: No Crepitus: No Cyanosis: No Excoriation: No Ecchymosis: No Fluctuance: No Erythema: No Friable: No Hemosiderin Staining: No Induration: No Mottled: No Localized Edema: No Pallor: No Rash: No Rubor: No Scarring: No Temperature / Pain Moisture Temperature: No Abnormality No Abnormalities Noted: No Dry / Scaly: No Maceration: Yes Moist: Yes Wound Preparation Ulcer Cleansing: Rinsed/Irrigated with Saline Topical Anesthetic Applied: None Treatment Notes Wound #3 (Left, Plantar Foot) 1. Cleansed with: Clean wound with Normal Saline 4. Dressing Applied: Aquacel Ag 5. Secondary Dressing Applied Kerlix/Conform 7. Secured with Recruitment consultant) Signed: 08/27/2015  3:54:28 PM By: Gretta Cool, RN, BSN, Kim RN, BSN Entered  By: Gretta Cool, RN, BSN, Kim on 08/27/2015 15:08:24 [name] [name] ([id number]) -------------------------------------------------------------------------------- [place] Details Patient Name: [name] [name] Date of Service: 08/27/2015 2:30 PM Medical Record Number: [id number] Patient Account Number: [id number] Date of Birth/Sex: Oct 29, 1926 (82 y.o. Male) Treating RN: Cornell Barman Primary Care Physician: Alvester Chou Other Clinician: Referring Physician: Alvester Chou Treating Physician/Extender: Frann Rider in Treatment: 20 Vital Signs Time Taken: 14:50 Temperature (F): 97.9 Height (in): 70 Pulse (bpm): 75 Weight (lbs): 230 Respiratory Rate (breaths/min): 18 Body Mass Index (BMI): 33 Blood Pressure (mmHg): 119/53 Reference Range: 80 - 120 mg / dl Electronic Signature(s) Signed: 08/27/2015 3:54:28 PM By: Gretta Cool, RN, BSN, Kim RN, BSN Entered By: Gretta Cool, RN, BSN, Kim on 08/27/2015 14:50:49

## 2015-08-30 DIAGNOSIS — E11622 Type 2 diabetes mellitus with other skin ulcer: Secondary | ICD-10-CM | POA: Diagnosis not present

## 2015-08-30 NOTE — Progress Notes (Signed)
[name] [name], [name] [name] ([id number]) Visit Report for 08/30/2015 Arrival Information Details Patient Name: [name] [name], [name] [name] Date of Service: 08/30/2015 3:00 PM Medical Record Number: [id number] Patient Account Number: [id number] Date of Birth/Sex: [birth year] (82 y.o. Male) Treating RN: [name] Gouty, RN, BSN, [name] [name] Primary Care Physician: [name] [name] Other Clinician: Referring Physician: Alvester [name] Treating Physician/Extender: [name] [name] in Treatment: 20 Visit Information History Since Last Visit Added or deleted any medications: No Patient Arrived: [name] Any new allergies or adverse reactions: No Arrival Time: 15:28 Had a fall or experienced change in No Accompanied By: self activities of daily living that may affect Transfer Assistance: None risk of falls: Patient Identification Verified: Yes Signs or symptoms of abuse/neglect since last No Secondary Verification Process Yes visito Completed: Hospitalized since last visit: No Patient Has Alerts: Yes Has Dressing in Place as Prescribed: Yes Has Compression in Place as Prescribed: Yes Pain Present Now: No Electronic Signature(s) Signed: 08/30/2015 5:01:36 PM By: [name] [name] BSN, RN Entered By: [name] [name] on 08/30/2015 15:28:45 [name] [name] ([id number]) -------------------------------------------------------------------------------- Encounter Discharge Information Details Patient Name: [name] [name] Date of Service: 08/30/2015 3:00 PM Medical Record Number: [id number] Patient Account Number: [id number] Date of Birth/Sex: 10/29/1926 (82 y.o. Male) Treating RN: [name] Gouty, RN, BSN, [name] [name] Primary Care Physician: [name] [name] Other Clinician: Referring Physician: Alvester [name] Treating Physician/Extender: [name] [name] in Treatment: 20 Encounter Discharge Information Items Discharge Pain Level: 0 Discharge Condition: Stable Ambulatory Status: [name] Discharge Destination: Home Transportation: Other Accompanied By: self Schedule Follow-up  Appointment: No Medication Reconciliation completed and No provided to Patient/Care [name] [name]: Clinical Summary of Care: Electronic Signature(s) Signed: 08/30/2015 5:01:36 PM By: [name] [name] BSN, RN Entered By: [name] [name] on 08/30/2015 16:01:35 [name] [name] ([id number]) -------------------------------------------------------------------------------- Patient/Caregiver Education Details Patient Name: [name] [name] Date of Service: 08/30/2015 3:00 PM Medical Record Number: [id number] Patient Account Number: [id number] Date of Birth/Gender: July 02, 1926 (82 y.o. Male) Treating RN: [name] Gouty, RN, BSN, [name] [name] Primary Care Physician: [name] [name] Other Clinician: Referring Physician: Alvester [name] Treating Physician/Extender: [name] [name] in Treatment: 20 Education Assessment Education Provided To: Patient Education Topics Provided Venous: Methods: Explain/Verbal Responses: State content correctly Welcome To [facility]: Methods: Explain/Verbal Responses: State content correctly Electronic Signature(s) Signed: 08/30/2015 5:01:36 PM By: [name] [name] BSN, RN Entered By: [name] [name] on 08/30/2015 16:01:19 [name] [name] ([id number]) -------------------------------------------------------------------------------- Wound Assessment Details Patient Name: [name] [name] Date of Service: 08/30/2015 3:00 PM Medical Record Number: [id number] Patient Account Number: [id number] Date of Birth/Sex: 05/21/1926 (82 y.o. Male) Treating RN: [name] Gouty, RN, BSN, [name] [name] Primary Care Physician: [name] [name] Other Clinician: Referring Physician: Alvester [name] Treating Physician/Extender: [name] [name] in Treatment: 20 Wound Status Wound Number: 1 Primary Lymphedema Etiology: Wound Location: Right Lower Leg - Circumfernential Wound Open Status: Wounding Event: Gradually Appeared Comorbid Congestive Heart Failure, Date Acquired: 12/28/2014 History: Hypertension, Type II Diabetes, Weeks Of Treatment:  20 Neuropathy Clustered Wound: Yes Photos Photo Uploaded By: [name] [name] on 08/30/2015 17:00:55 Wound Measurements Length: (cm) 14 Width: (cm) 13 Depth: (cm) 0.1 Area: (cm) 142.942 Volume: (cm) 14.294 % Reduction in Area: 63.2% % Reduction in Volume: 63.2% Epithelialization: Medium (34-66%) Tunneling: No Undermining: No Wound Description Classification: Partial Thickness Foul Odor Af Diabetic Severity (Wagner): Grade 1 Due to Produ Wound Margin: Flat and Intact Exudate Amount: Large [name] [name] ([id number]) ter Cleansing: Yes ct Use: No Exudate Type: Serosanguineous Exudate Color: red, brown Wound Bed Granulation Amount: Medium (34-66%) Exposed Structure Granulation Quality: Pink Fascia Exposed: No Necrotic Amount: Medium (34-66%) Fat Layer Exposed: No Necrotic Quality: Adherent Slough  Tendon Exposed: No Muscle Exposed: No Joint Exposed: No Bone Exposed: No Limited to Skin Breakdown Periwound Skin Texture Texture Color No Abnormalities Noted: No No Abnormalities Noted: No Callus: No Atrophie Blanche: No Crepitus: No Cyanosis: No Excoriation: No Ecchymosis: No Fluctuance: No Erythema: No Friable: No Hemosiderin Staining: No Induration: No Mottled: No Localized Edema: No Pallor: No Rash: No Rubor: No Scarring: No Temperature / Pain Moisture Temperature: No Abnormality No Abnormalities Noted: No Tenderness on Palpation: Yes Dry / Scaly: No Maceration: Yes Moist: Yes Wound Preparation Ulcer Cleansing: Rinsed/Irrigated with Saline, Other: soap and water, Topical Anesthetic Applied: None Treatment Notes Wound #1 (Right, Circumferential Lower Leg) 1. Cleansed with: Cleanse wound with antibacterial soap and water 4. Dressing Applied: Aquacel Ag 5. Secondary Dressing Applied ABD Pad 6. Footwear/Offloading device applied Surgical shoe 7. Secured with [name] [name] ([id number]) [name] [name] Bilaterally Electronic Signature(s) Signed: 08/30/2015  5:01:36 PM By: [name] [name] BSN, RN Entered By: [name] [name] on 08/30/2015 15:57:46 [name] [name] ([id number]) -------------------------------------------------------------------------------- Wound Assessment Details Patient Name: [name] [name] Date of Service: 08/30/2015 3:00 PM Medical Record Number: [id number] Patient Account Number: [id number] Date of Birth/Sex: 1926-06-24 (82 y.o. Male) Treating RN: [name] Gouty, RN, BSN, [name] [name] Primary Care Physician: [name] [name] Other Clinician: Referring Physician: Alvester [name] Treating Physician/Extender: [name] [name] in Treatment: 20 Wound Status Wound Number: 2 Primary Lymphedema Etiology: Wound Location: Left Lower Leg - Circumfernential Wound Open Status: Wounding Event: Gradually Appeared Comorbid Congestive Heart Failure, Date Acquired: 12/28/2014 History: Hypertension, Type II Diabetes, Weeks Of Treatment: 20 Neuropathy Clustered Wound: Yes Photos Photo Uploaded By: [name] [name] on 08/30/2015 17:00:55 Wound Measurements Length: (cm) 13 Width: (cm) 15 Depth: (cm) 0.1 Area: (cm) 153.153 Volume: (cm) 15.315 % Reduction in Area: 42% % Reduction in Volume: 42% Epithelialization: Medium (34-66%) Tunneling: No Undermining: No Wound Description Classification: Partial Thickness Diabetic Severity (Wagner): Grade 1 Wound Margin: Flat and Intact Exudate Amount: Large Exudate Type: Purulent Exudate Color: yellow, brown, green Foul Odor After Cleansing: Yes Due to Product Use: No Wound Bed Granulation Amount: Medium (34-66%) Exposed Structure Granulation Quality: Pink Fascia Exposed: No Bonet, Merik ([id number]) Necrotic Amount: Medium (34-66%) Fat Layer Exposed: No Necrotic Quality: Adherent Slough Tendon Exposed: No Muscle Exposed: No Joint Exposed: No Bone Exposed: No Limited to Skin Breakdown Periwound Skin Texture Texture Color No Abnormalities Noted: No No Abnormalities Noted: No Callus: No Atrophie Blanche:  No Crepitus: No Cyanosis: No Excoriation: No Ecchymosis: No Fluctuance: No Erythema: No Friable: No Hemosiderin Staining: No Induration: No Mottled: No Localized Edema: No Pallor: No Rash: No Rubor: No Scarring: No Temperature / Pain Moisture Temperature: No Abnormality No Abnormalities Noted: No Tenderness on Palpation: Yes Dry / Scaly: No Maceration: Yes Moist: Yes Wound Preparation Ulcer Cleansing: Other: surg scrub and water, Topical Anesthetic Applied: None Treatment Notes Wound #2 (Left, Circumferential Lower Leg) 1. Cleansed with: Cleanse wound with antibacterial soap and water 4. Dressing Applied: Aquacel Ag 5. Secondary Dressing Applied ABD Pad 6. Footwear/Offloading device applied Surgical shoe 7. Secured with Water engineer) Signed: 08/30/2015 5:01:36 PM By: [name] [name] BSN, RN Entered By: [name] [name] on 08/30/2015 15:59:40 WM, RIVIELLO ([id number]) KOU, BELLANTI ([id number]) -------------------------------------------------------------------------------- Wound Assessment Details Patient Name: [name] [name] Date of Service: 08/30/2015 3:00 PM Medical Record Number: [id number] Patient Account Number: [id number] Date of Birth/Sex: [birth year] (82 y.o. Male) Treating RN: [name] Gouty, RN, BSN, [name] [name] Primary Care Physician: [name] [name] Other Clinician: Referring Physician: Alvester [name] Treating Physician/Extender: [name] [name] in Treatment: 20 Wound Status Wound Number:  3 Primary Lymphedema Etiology: Wound Location: Left Foot - Plantar Wound Open Wounding Event: Gradually Appeared Status: Date Acquired: 01/27/2015 Comorbid Congestive Heart Failure, Weeks Of Treatment: 20 History: Hypertension, Type II Diabetes, Clustered Wound: No Neuropathy Photos Photo Uploaded By: [name] [name] on 08/30/2015 17:01:10 Wound Measurements Length: (cm) 0.5 Width: (cm) 2.5 Depth: (cm) 0.1 Area: (cm) 0.982 Volume: (cm) 0.098 % Reduction  in Area: 87% % Reduction in Volume: 87% Epithelialization: None Tunneling: No Undermining: No Wound Description Classification: Partial Thickness Diabetic Severity (Wagner): Grade 1 Wound Margin: Flat and Intact Exudate Amount: Large Exudate Type: Purulent Exudate Color: yellow, brown, gree Foul Odor After Cleansing: Yes Due to Product Use: No n Wound Bed Granulation Amount: Large (67-100%) Exposed Structure Granulation Quality: Pink Fascia Exposed: No Necrotic Amount: Small (1-33%) Fat Layer Exposed: No Fahs, Therin ([id number]) Necrotic Quality: Adherent Slough Tendon Exposed: No Muscle Exposed: No Joint Exposed: No Bone Exposed: No Limited to Skin Breakdown Periwound Skin Texture Texture Color No Abnormalities Noted: No No Abnormalities Noted: No Callus: No Atrophie Blanche: No Crepitus: No Cyanosis: No Excoriation: No Ecchymosis: No Fluctuance: No Erythema: No Friable: No Hemosiderin Staining: No Induration: No Mottled: No Localized Edema: No Pallor: No Rash: No Rubor: No Scarring: No Temperature / Pain Moisture Temperature: No Abnormality No Abnormalities Noted: No Tenderness on Palpation: Yes Dry / Scaly: No Maceration: Yes Moist: Yes Wound Preparation Ulcer Cleansing: Rinsed/Irrigated with Saline, Other: Surgscrub and water, Topical Anesthetic Applied: None Treatment Notes Wound #3 (Left, Plantar Foot) 1. Cleansed with: Cleanse wound with antibacterial soap and water 4. Dressing Applied: Aquacel Ag 5. Secondary Dressing Applied ABD Pad 6. Footwear/Offloading device applied Surgical shoe 7. Secured with Water engineer) Signed: 08/30/2015 5:01:36 PM By: [name] [name] BSN, RN Entered By: [name] [name] on 08/30/2015 16:00:10

## 2015-09-03 ENCOUNTER — Encounter: Payer: Medicare Other | Admitting: Surgery

## 2015-09-03 DIAGNOSIS — E11622 Type 2 diabetes mellitus with other skin ulcer: Secondary | ICD-10-CM | POA: Diagnosis not present

## 2015-09-03 NOTE — Progress Notes (Addendum)
NA, [name] ([id number]) Visit Report for 09/03/2015 Chief Complaint Document Details Patient Name: [name] [name], [name] [name] Date of Service: 09/03/2015 1:30 PM Medical Record Number: [id number] Patient Account Number: [id number] Date of Birth/Sex: 24-Oct-1926 (82 y.o. Male) Treating RN: [name] [name] Primary Care Physician: [name] [name] Other Clinician: Referring Physician: Alvester [name] Treating Physician/Extender: [name] [name] in Treatment: 21 Information Obtained from: Patient Chief Complaint Patient presents to the wound care center for a consult due non healing wound both lower extremities. The patient is a poor historian and was seen here once in July 2016. He says home health was doing his dressings and he was healed but now has recurrence of the problem. Problem has been there at least for 6 months. Electronic Signature(s) Signed: 09/03/2015 2:09:22 PM By: [name] Fudge MD, FACS Entered By: [name] [name] on 09/03/2015 14:09:22 [name] [name] ([id number]) -------------------------------------------------------------------------------- HPI Details Patient Name: [name] [name] Date of Service: 09/03/2015 1:30 PM Medical Record Number: [id number] Patient Account Number: [id number] Date of Birth/Sex: 07/13/1926 (82 y.o. Male) Treating RN: [name] [name] Primary Care Physician: [name] [name] Other Clinician: Referring Physician: Alvester [name] Treating Physician/Extender: [name] [name] in Treatment: 21 History of Present Illness Location: swelling of the legs right more than left an ulceration both lower extremities Quality: Patient reports experiencing a dull pain to affected area(s). Severity: Patient states wound (s) are getting worse Duration: Patient has had the wound for > 6 months prior to seeking treatment at the wound center Timing: Pain in wound is Intermittent. Context: The wound appeared gradually over time Modifying Factors: he has been having home health come and apply his dressing daily was  healed. Associated Signs and Symptoms: Patient reports having difficulty standing for long periods. HPI Description: [name] [name] is a 82 y.o. male with history of DM, hypertension, chronic kidney disease, gout, diastolic dysfunction and the patient was having increasing swelling with increasing fluid draining from the legs and pain over the last 6 weeks. He says his wounds are completely healed and the home health had stopped coming to do his dressings. Recent hemoglobin A1c was 6.6. During the last admission A venous duplex study revealed no evidence of deep vein thrombosis or superficial thrombosis involving the right and left lower extremity. Was seen in January 2016 by [name] [name] at [facility]: Non-Invasive Vascular Imaging ABI (05/05/2014) o R: 0.65 (0.73), DP: mono, PT: mono, TBI: 0.57 o L: 0.65 (0.69), DP: mono, PT: mono, TBI: 0.36 The patient had the diagnosis of bilateral lower extremity moderate peripheral atherosclerotic disease, bilateral lower extremity chronic venous insufficiency. Due to the patient's age and mental status conservative therapy was only discussed and no surgical intervention was planned. The patient has significant dementia and has a caregiver who spends a few hours every day coming to see him. He does not have any family members. 05/21/2015 -- lower extremity arterial studies done on 05/17/2015 show the right ABI is 0.50-0.79 indicating moderate arterial occlusive disease at rest. The left ABI was 0.5-0.79 indicating moderate arterial occlusive disease at rest. The left posterior tibial and peroneal arteries were not evaluated due to local wound condition. The right TBI was less than 0.69 and the left ABI was less than 0.69 which are both abnormal. Since it's been a year since the previous opinion from [name] [name] I have asked him to see him for a review. 05/28/2015 -- was seen by [name] [name] and he recommended that he was not a good candidate for surgery and surgical  revascularization if the need develops. He  recommends to continue with compressive therapy and wound care. 06/04/2015 -- had asked him to remove his compression wraps have a shower and come to see as but he has probably been scratching his legs and caused a lot of excoriation and has multiple open ulcers which are much more worse than before. The patient is not very compliant and may be this is because of his NAAZIR, [name] [name] ([id number]) dementia. 08/06/2015 -- his hygiene continues to be very bad and he has a lot of drainage from his wounds and is not happy about his home health nurses and would like to change them. 09/03/2015 -- due to his mental status and his poor living conditions he continues to be noncompliant with his dressing and his hygiene is terrible. Electronic Signature(s) Signed: 09/03/2015 2:09:54 PM By: [name] Fudge MD, FACS Entered By: [name] [name] on 09/03/2015 14:09:54 [name] [name] ([id number]) -------------------------------------------------------------------------------- Physical Exam Details Patient Name: [name] [name] Date of Service: 09/03/2015 1:30 PM Medical Record Number: [id number] Patient Account Number: [id number] Date of Birth/Sex: 1926-09-13 (82 y.o. Male) Treating RN: [name] [name] Primary Care Physician: [name] [name] Other Clinician: Referring Physician: Alvester [name] Treating Physician/Extender: [name] [name] in Treatment: 21 Constitutional . Pulse regular. Respirations normal and unlabored. Afebrile. . Eyes Nonicteric. Reactive to light. Ears, Nose, Mouth, and Throat Lips, teeth, and gums WNL.Marland Kitchen Moist mucosa without lesions. Neck supple and nontender. No palpable supraclavicular or cervical adenopathy. Normal sized without goiter. Respiratory WNL. No retractions.. Cardiovascular Pedal Pulses WNL. No clubbing, cyanosis or edema. Lymphatic No adneopathy. No adenopathy. No adenopathy. Musculoskeletal Adexa without tenderness or enlargement.. Digits and  nails w/o clubbing, cyanosis, infection, petechiae, ischemia, or inflammatory conditions.. Integumentary (Hair, Skin) No suspicious lesions. No crepitus or fluctuance. No peri-wound warmth or erythema. No masses.Marland Kitchen Psychiatric Judgement and insight Intact.. No evidence of depression, anxiety, or agitation.. Notes both lower extremity wounds continue to have significant amount of open ulcerated areas which have been surrounded by lymphedema. His hygiene continues to be very poor Engineer, maintenance) Signed: 09/03/2015 2:11:41 PM By: [name] Fudge MD, FACS Entered By: [name] [name] on 09/03/2015 14:11:41 [name] [name] ([id number]) -------------------------------------------------------------------------------- Physician Orders Details Patient Name: [name] [name] Date of Service: 09/03/2015 1:30 PM Medical Record Number: [id number] Patient Account Number: [id number] Date of Birth/Sex: 10-Oct-1926 (82 y.o. Male) Treating RN: [name] [name] Primary Care Physician: [name] [name] Other Clinician: Referring Physician: Alvester [name] Treating Physician/Extender: [name] [name] in Treatment: 21 Verbal / Phone Orders: Yes Clinician: Cornell [name] Read Back and Verified: Yes Diagnosis Coding Wound Cleansing Wound #1 Right,Circumferential Lower Leg o May shower with protection. Wound #2 Left,Circumferential Lower Leg o May shower with protection. Primary Wound Dressing Wound #1 Right,Circumferential Lower Leg o Aquacel Ag Wound #2 Left,Circumferential Lower Leg o Aquacel Ag Wound #3 Left,Plantar Foot o Aquacel Ag Secondary Dressing Wound #1 Right,Circumferential Lower Leg o ABD pad Wound #2 Left,Circumferential Lower Leg o ABD pad Wound #3 Left,Plantar Foot o ABD pad Dressing Change Frequency Wound #1 Right,Circumferential Lower Leg o Change Dressing Monday, Wednesday, Friday - Thursday in clinic if [facility] does not come to see patient Wound #2 Left,Circumferential Lower  Leg o Change Dressing Monday, Wednesday, Friday Wound #3 Left,Plantar Foot o Change Dressing Monday, Wednesday, Friday NUSSEN, WINGERTER ([id number]) Follow-up Appointments Wound #1 Right,Circumferential Lower Leg o Return Appointment in 1 week. o Nurse Visit as needed - Thursday Wound #2 Left,Circumferential Lower Leg o Return Appointment in 1 week. o Nurse Visit as needed Wound #3 Left,Plantar Foot o Return Appointment in 1 week. o  Nurse Visit as needed Edema Control Wound #1 Right,Circumferential Lower Leg o Unna Boots Bilaterally Wound #2 Left,Circumferential Lower Leg o Retail buyer Bilaterally Home Health Wound #1 Right,Circumferential Lower Leg o [place] Visits Wound #2 Left,Circumferential Lower Leg o [place] Visits Wound #3 [place] Visits Medications-please add to medication list. Wound #1 Right,Circumferential Lower Leg o P.O. Antibiotics - continue doxycycline Wound #2 Left,Circumferential Lower Leg o P.O. Antibiotics - continue doxycycline Wound #3 Left,Plantar Foot o P.O. Antibiotics - continue doxycycline Electronic Signature(s) Signed: 09/03/2015 4:11:53 PM By: [name] Fudge MD, FACS Signed: 09/04/2015 12:09:58 PM By: Gretta Cool RN, BSN, Kim RN, BSN Entered By: Gretta Cool, RN, BSN, Kim on 09/03/2015 14:32:54 [name] [name], [name] [name] ([id number]) [name] [name], [name] [name] ([id number]) -------------------------------------------------------------------------------- Problem List Details Patient Name: [name] [name] Date of Service: 09/03/2015 1:30 PM Medical Record Number: [id number] Patient Account Number: [id number] Date of Birth/Sex: 1926-11-22 (82 y.o. Male) Treating RN: [name] [name] Primary Care Physician: [name] [name] Other Clinician: Referring Physician: Alvester [name] Treating Physician/Extender: [name] [name] in Treatment: 21 Active Problems ICD-10 Encounter Code Description Active Date Diagnosis E11.622  Type 2 diabetes mellitus with other skin ulcer 04/09/2015 Yes I89.0 Lymphedema, not elsewhere classified 04/09/2015 Yes I70.238 Atherosclerosis of native arteries of right leg with 04/09/2015 Yes ulceration of other part of lower right leg I70.248 Atherosclerosis of native arteries of left leg with ulceration 04/09/2015 Yes of other part of lower left leg I87.331 Chronic venous hypertension (idiopathic) with ulcer and 04/09/2015 Yes inflammation of right lower extremity I87.332 Chronic venous hypertension (idiopathic) with ulcer and 04/09/2015 Yes inflammation of left lower extremity Inactive Problems Resolved Problems Electronic Signature(s) Signed: 09/03/2015 2:09:14 PM By: [name] Fudge MD, FACS Entered By: [name] [name] on 09/03/2015 14:09:14 [name] [name] ([id number]) -------------------------------------------------------------------------------- Progress Note Details Patient Name: [name] [name] Date of Service: 09/03/2015 1:30 PM Medical Record Number: [id number] Patient Account Number: [id number] Date of Birth/Sex: Mar 12, 1927 (82 y.o. Male) Treating RN: [name] [name] Primary Care Physician: [name] [name] Other Clinician: Referring Physician: Alvester [name] Treating Physician/Extender: [name] [name] in Treatment: 21 Subjective Chief Complaint Information obtained from Patient Patient presents to the wound care center for a consult due non healing wound both lower extremities. The patient is a poor historian and was seen here once in July 2016. He says home health was doing his dressings and he was healed but now has recurrence of the problem. Problem has been there at least for 6 months. History of Present Illness (HPI) The following HPI elements were documented for the patient's wound: Location: swelling of the legs right more than left an ulceration both lower extremities Quality: Patient reports experiencing a dull pain to affected area(s). Severity: Patient states wound (s) are  getting worse Duration: Patient has had the wound for > 6 months prior to seeking treatment at the wound center Timing: Pain in wound is Intermittent. Context: The wound appeared gradually over time Modifying Factors: he has been having home health come and apply his dressing daily was healed. Associated Signs and Symptoms: Patient reports having difficulty standing for long periods. [name] [name] is a 82 y.o. male with history of DM, hypertension, chronic kidney disease, gout, diastolic dysfunction and the patient was having increasing swelling with increasing fluid draining from the legs and pain over the last 6 weeks. He says his wounds are completely healed and the home health had stopped coming to do his dressings. Recent hemoglobin A1c was 6.6. During the last admission A venous duplex study revealed no evidence  of deep vein thrombosis or superficial thrombosis involving the right and left lower extremity. Was seen in January 2016 by [name] [name] at [facility]: Non-Invasive Vascular Imaging ABI (05/05/2014)  R: 0.65 (0.73), DP: mono, PT: mono, TBI: 0.57  L: 0.65 (0.69), DP: mono, PT: mono, TBI: 0.36 The patient had the diagnosis of bilateral lower extremity moderate peripheral atherosclerotic disease, bilateral lower extremity chronic venous insufficiency. Due to the patient's age and mental status conservative therapy was only discussed and no surgical intervention was planned. The patient has significant dementia and has a caregiver who spends a few hours every day coming to see him. He does not have any family members. 05/21/2015 -- lower extremity arterial studies done on 05/17/2015 show the right ABI is 0.50-0.79 indicating moderate arterial occlusive disease at rest. The left ABI was 0.5-0.79 indicating moderate arterial occlusive disease at rest. The left posterior tibial and peroneal arteries were not evaluated due to local wound [name] [name], [name] [name] ([id number]) condition. The right TBI  was less than 0.69 and the left ABI was less than 0.69 which are both abnormal. Since it's been a year since the previous opinion from [name] [name] I have asked him to see him for a review. 05/28/2015 -- was seen by [name] [name] and he recommended that he was not a good candidate for surgery and surgical revascularization if the need develops. He recommends to continue with compressive therapy and wound care. 06/04/2015 -- had asked him to remove his compression wraps have a shower and come to see as but he has probably been scratching his legs and caused a lot of excoriation and has multiple open ulcers which are much more worse than before. The patient is not very compliant and may be this is because of his dementia. 08/06/2015 -- his hygiene continues to be very bad and he has a lot of drainage from his wounds and is not happy about his home health nurses and would like to change them. 09/03/2015 -- due to his mental status and his poor living conditions he continues to be noncompliant with his dressing and his hygiene is terrible. Objective Constitutional Pulse regular. Respirations normal and unlabored. Afebrile. Vitals Time Taken: 1:51 PM, Height: 70 in, Weight: 230 lbs, BMI: 33, Temperature: 97.5 F, Pulse: 91 bpm, Respiratory Rate: 18 breaths/min, Blood Pressure: 132/55 mmHg. Eyes Nonicteric. Reactive to light. Ears, Nose, Mouth, and Throat Lips, teeth, and gums WNL.Marland Kitchen Moist mucosa without lesions. Neck supple and nontender. No palpable supraclavicular or cervical adenopathy. Normal sized without goiter. Respiratory WNL. No retractions.. Cardiovascular Pedal Pulses WNL. No clubbing, cyanosis or edema. Lymphatic No adneopathy. No adenopathy. No adenopathy. Musculoskeletal [name] [name], [name] [name] ([id number]) Adexa without tenderness or enlargement.. Digits and nails w/o clubbing, cyanosis, infection, petechiae, ischemia, or inflammatory conditions.Marland Kitchen Psychiatric Judgement and insight Intact.. No  evidence of depression, anxiety, or agitation.. General Notes: both lower extremity wounds continue to have significant amount of open ulcerated areas which have been surrounded by lymphedema. His hygiene continues to be very poor Integumentary (Hair, Skin) No suspicious lesions. No crepitus or fluctuance. No peri-wound warmth or erythema. No masses.. Wound #1 status is Open. Original cause of wound was Gradually Appeared. The wound is located on the Right,Circumferential Lower Leg. The wound measures 12cm length x 10cm width x 0.2cm depth; 94.248cm^2 area and 18.85cm^3 volume. The wound is limited to skin breakdown. There is a large amount of serosanguineous drainage noted. The wound margin is flat and intact. There is medium (34-66%) pink granulation within the wound  bed. There is a medium (34-66%) amount of necrotic tissue within the wound bed including Adherent Slough. The periwound skin appearance exhibited: Maceration, Moist. The periwound skin appearance did not exhibit: Callus, Crepitus, Excoriation, Fluctuance, Friable, Induration, Localized Edema, Rash, Scarring, Dry/Scaly, Atrophie Blanche, Cyanosis, Ecchymosis, Hemosiderin Staining, Mottled, Pallor, Rubor, Erythema. Periwound temperature was noted as No Abnormality. The periwound has tenderness on palpation. Wound #2 status is Open. Original cause of wound was Gradually Appeared. The wound is located on the Left,Circumferential Lower Leg. The wound measures 12cm length x 32cm width x 0.2cm depth; 301.593cm^2 area and 60.319cm^3 volume. Wound #3 status is Open. Original cause of wound was Gradually Appeared. The wound is located on the [place]. The wound measures 0.5cm length x 2.1cm width x 0.1cm depth; 0.825cm^2 area and 0.082cm^3 volume. Assessment Active Problems ICD-10 E11.622 - Type 2 diabetes mellitus with other skin ulcer I89.0 - Lymphedema, not elsewhere classified I70.238 - Atherosclerosis of native arteries  of right leg with ulceration of other part of lower right leg I70.248 - Atherosclerosis of native arteries of left leg with ulceration of other part of lower left leg I87.331 - Chronic venous hypertension (idiopathic) with ulcer and inflammation of right lower extremity I87.332 - Chronic venous hypertension (idiopathic) with ulcer and inflammation of left lower extremity [name] [name], [name] [name] ([id number]) Plan Wound Cleansing: Wound #1 Right,Circumferential Lower Leg: May shower with protection. Wound #2 Left,Circumferential Lower Leg: May shower with protection. Primary Wound Dressing: Wound #1 Right,Circumferential Lower Leg: Aquacel Ag Wound #2 Left,Circumferential Lower Leg: Aquacel Ag Wound #3 Left,Plantar Foot: Aquacel Ag Secondary Dressing: Wound #1 Right,Circumferential Lower Leg: ABD pad Wound #2 Left,Circumferential Lower Leg: ABD pad Wound #3 Left,Plantar Foot: ABD pad Dressing Change Frequency: Wound #1 Right,Circumferential Lower Leg: Change Dressing Monday, Wednesday, Friday - Thursday in clinic if [facility] does not come to see patient Wound #2 Left,Circumferential Lower Leg: Change Dressing Monday, Wednesday, Friday Wound #3 Left,Plantar Foot: Change Dressing Monday, Wednesday, Friday Follow-up Appointments: Wound #1 Right,Circumferential Lower Leg: Return Appointment in 1 week. Nurse Visit as needed - Thursday Wound #2 Left,Circumferential Lower Leg: Return Appointment in 1 week. Nurse Visit as needed Wound #3 Left,Plantar Foot: Return Appointment in 1 week. Nurse Visit as needed Edema Control: Wound #1 Right,Circumferential Lower Leg: Unna Boots Bilaterally Wound #2 Left,Circumferential Lower Leg: Retail buyer Bilaterally Home Health: Wound #1 Right,Circumferential Lower Leg: Continue Home Health Visits Wound #2 Left,Circumferential Lower Leg: [name] [name], [name] [name] ([id number]) [name] [name] Visits Wound #3 Left,Plantar Foot: [place]  Visits Medications-please add to medication list.: Wound #1 Right,Circumferential Lower Leg: P.O. Antibiotics - continue doxycycline Wound #2 Left,Circumferential Lower Leg: P.O. Antibiotics - continue doxycycline Wound #3 Left,Plantar Foot: P.O. Antibiotics - continue doxycycline This unfortunate gentleman with significant dementia and noncompliance continues to have poor living conditions and we are trying to help him the best we can with palliative care. If his home health nurses resume his dressing changes we can see him once a week. We are using silver alginate and an Unna's boot. Electronic Signature(s) Signed: 09/03/2015 2:49:07 PM By: [name] Fudge MD, FACS Previous Signature: 09/03/2015 2:12:46 PM Version By: [name] Fudge MD, FACS Entered By: [name] [name] on 09/03/2015 14:49:07 [name] [name] ([id number]) -------------------------------------------------------------------------------- SuperBill Details Patient Name: [name] [name] Date of Service: 09/03/2015 Medical Record Number: [id number] Patient Account Number: [id number] Date of Birth/Sex: [birth year] (82 y.o. Male) Treating RN: [name] [name] Primary Care Physician: [name] [name] Other Clinician: Referring Physician: Alvester [name] Treating Physician/Extender: [name] [name] in Treatment: 21 Diagnosis  Coding ICD-10 Codes Code Description E11.622 Type 2 diabetes mellitus with other skin ulcer I89.0 Lymphedema, not elsewhere classified I70.238 Atherosclerosis of native arteries of right leg with ulceration of other part of lower right leg I70.248 Atherosclerosis of native arteries of left leg with ulceration of other part of lower left leg Chronic venous hypertension (idiopathic) with ulcer and inflammation of right lower I87.331 extremity Chronic venous hypertension (idiopathic) with ulcer and inflammation of left lower I87.332 extremity Facility Procedures CPT4 Code: [id number] Description: 29580 - APPLY UNNA BOOT/PROFO  BILATERAL Modifier: Quantity: 1 Physician Procedures CPT4: Description Modifier Quantity Code [id number] - WC PHYS LEVEL 3 - EST PT 1 ICD-10 Description Diagnosis E11.622 Type 2 diabetes mellitus with other skin ulcer I89.0 Lymphedema, not elsewhere classified I70.238 Atherosclerosis of native arteries  of right leg with ulceration of other part of lower right leg I87.331 Chronic venous hypertension (idiopathic) with ulcer and inflammation of right lower extremity Electronic Signature(s) Signed: 09/03/2015 4:11:53 PM By: [name] Fudge MD, FACS Signed: 09/04/2015 12:09:58 PM By: Gretta Cool RN, BSN, Kim RN, BSN Previous Signature: 09/03/2015 2:13:02 PM Version By: [name] Fudge MD, FACS Entered By: Gretta Cool RN, BSN, Kim on 09/03/2015 14:28:12

## 2015-09-04 NOTE — Progress Notes (Addendum)
KERMAN, BREECH ([id number]) Visit Report for 09/03/2015 Arrival Information Details Patient Name: [name] [name], [name] [name] Date of Service: 09/03/2015 1:30 PM Medical Record Number: [id number] Patient Account Number: [id number] Date of Birth/Sex: Mar 11, 1927 (82 y.o. Male) Treating RN: Cornell Barman Primary Care Physician: Alvester Chou Other Clinician: Referring Physician: Alvester Chou Treating Physician/Extender: Frann Rider in Treatment: 21 Visit Information History Since Last Visit Added or deleted any medications: No Patient Arrived: Walker Any new allergies or adverse reactions: No Arrival Time: 13:50 Had a fall or experienced change in No Accompanied By: caregiver activities of daily living that may affect Transfer Assistance: None risk of falls: Patient Identification Verified: Yes Signs or symptoms of abuse/neglect since last No Secondary Verification Process Yes visito Completed: Hospitalized since last visit: No Patient Has Alerts: Yes Has Dressing in Place as Prescribed: No Has Compression in Place as Prescribed: No Pain Present Now: No Electronic Signature(s) Signed: 09/04/2015 12:09:58 PM By: Gretta Cool, RN, BSN, Kim RN, BSN Entered By: Gretta Cool, RN, BSN, Kim on 09/03/2015 13:51:24 [name] [name] ([id number]) -------------------------------------------------------------------------------- Encounter Discharge Information Details Patient Name: [name] [name] Date of Service: 09/03/2015 1:30 PM Medical Record Number: [id number] Patient Account Number: [id number] Date of Birth/Sex: 08-Dec-1926 (82 y.o. Male) Treating RN: Cornell Barman Primary Care Physician: Alvester Chou Other Clinician: Referring Physician: Alvester Chou Treating Physician/Extender: Frann Rider in Treatment: 21 Encounter Discharge Information Items Discharge Pain Level: 0 Discharge Condition: Stable Ambulatory Status: Walker Discharge Destination: Home Transportation: Private Auto Accompanied By: caregiver Schedule  Follow-up Appointment: Yes Medication Reconciliation completed No and provided to Patient/Care Katti Pelle: Provided on Clinical Summary of Care: 09/03/2015 Form Type Recipient Paper Patient KL Electronic Signature(s) Signed: 09/04/2015 12:09:58 PM By: Gretta Cool, RN, BSN, Kim RN, BSN Previous Signature: 09/03/2015 2:27:59 PM Version By: Ruthine Dose Entered By: Gretta Cool RN, BSN, Kim on 09/03/2015 14:29:01 [name] [name] ([id number]) -------------------------------------------------------------------------------- Lower Extremity Assessment Details Patient Name: [name] [name] Date of Service: 09/03/2015 1:30 PM Medical Record Number: [id number] Patient Account Number: [id number] Date of Birth/Sex: 04/22/1927 (82 y.o. Male) Treating RN: Cornell Barman Primary Care Physician: Alvester Chou Other Clinician: Referring Physician: Alvester Chou Treating Physician/Extender: Frann Rider in Treatment: 21 Edema Assessment Assessed: [Left: No] [Right: No] E[Left: dema] [Right: :] Calf Left: Right: Point of Measurement: 32 cm From Medial Instep 44 cm 41 cm Ankle Left: Right: Point of Measurement: 12 cm From Medial Instep 28 cm 28 cm Vascular Assessment Pulses: Posterior Tibial Dorsalis Pedis Palpable: [Left:Yes] [Right:Yes] Extremity colors, hair growth, and conditions: Extremity Color: [Left:Hyperpigmented] [Right:Hyperpigmented] Hair Growth on Extremity: [Left:No] [Right:No] Temperature of Extremity: [Left:Warm] [Right:Warm] Capillary Refill: [Left:> 3 seconds] [Right:> 3 seconds] Toe Nail Assessment Left: Right: Thick: No No Discolored: No Yes Deformed: Yes No Improper Length and Hygiene: No No Electronic Signature(s) Signed: 09/04/2015 12:09:58 PM By: Gretta Cool, RN, BSN, Kim RN, BSN Entered By: Gretta Cool, RN, BSN, Kim on 09/03/2015 13:58:09 [name] [name] ([id number]) -------------------------------------------------------------------------------- Multi Wound Chart Details Patient Name: [name] [name] Date  of Service: 09/03/2015 1:30 PM Medical Record Number: [id number] Patient Account Number: [id number] Date of Birth/Sex: 14-Dec-1926 (82 y.o. Male) Treating RN: Cornell Barman Primary Care Physician: Alvester Chou Other Clinician: Referring Physician: Alvester Chou Treating Physician/Extender: Frann Rider in Treatment: 21 Vital Signs Height(in): 70 Pulse(bpm): 91 Weight(lbs): 230 Blood Pressure 132/55 (mmHg): Body Mass Index(BMI): 33 Temperature(F): 97.5 Respiratory Rate 18 (breaths/min): Photos: [1:No Photos] [2:No Photos] [3:No Photos] Wound Location: [1:Right Lower Leg - Circumfernential] [2:Left, Circumferential Lower Leg] [3:Left, Plantar Foot] Wounding Event: [1:Gradually Appeared] [2:Gradually Appeared] [3:Gradually Appeared] Primary Etiology: [1:Lymphedema] [  2:Lymphedema] [3:Lymphedema] Comorbid History: [1:Congestive Heart Failure, Hypertension, Type II Diabetes, Neuropathy] [2:N/A] [3:N/A] Date Acquired: [1:12/28/2014] [2:12/28/2014] [3:01/27/2015] Weeks of Treatment: [1:21] [2:21] [3:21] Wound Status: [1:Open] [2:Open] [3:Open] Clustered Wound: [1:Yes] [2:Yes] [3:No] Measurements L x W x D 12x10x0.2 [2:12x32x0.2] [3:0.5x2.1x0.1] (cm) Area (cm) : [1:94.248] [2:301.593] [3:0.825] Volume (cm) : [1:18.85] [2:60.319] [3:0.082] % Reduction in Area: [1:75.70%] [2:-14.30%] [3:89.10%] % Reduction in Volume: 51.40% [2:-128.60%] [3:89.10%] Classification: [1:Partial Thickness] [2:Partial Thickness] [3:Partial Thickness] HBO Classification: [1:Grade 1] [2:N/A] [3:N/A] Exudate Amount: [1:Large] [2:N/A] [3:N/A] Exudate Type: [1:Serosanguineous] [2:N/A] [3:N/A] Exudate Color: [1:red, brown] [2:N/A] [3:N/A] Foul Odor After [1:Yes] [2:N/A] [3:N/A] Cleansing: Odor Anticipated Due to No [2:N/A] [3:N/A] Product Use: Wound Margin: [1:Flat and Intact] [2:N/A] [3:N/A] Granulation Amount: [1:Medium (34-66%)] [2:N/A] [3:N/A] Granulation Quality: [1:Pink] [2:N/A] [3:N/A] Necrotic Amount:  Medium (34-66%) N/A N/A Exposed Structures: Fascia: No N/A N/A Fat: No Tendon: No Muscle: No Joint: No Bone: No Limited to Skin Breakdown Epithelialization: Medium (34-66%) N/A N/A Periwound Skin Texture: Edema: No No Abnormalities Noted No Abnormalities Noted Excoriation: No Induration: No Callus: No Crepitus: No Fluctuance: No Friable: No Rash: No Scarring: No Periwound Skin Maceration: Yes No Abnormalities Noted No Abnormalities Noted Moisture: Moist: Yes Dry/Scaly: No Periwound Skin Color: Atrophie Blanche: No No Abnormalities Noted No Abnormalities Noted Cyanosis: No Ecchymosis: No Erythema: No Hemosiderin Staining: No Mottled: No Pallor: No Rubor: No Temperature: No Abnormality N/A N/A Tenderness on Yes No No Palpation: Wound Preparation: Ulcer Cleansing: N/A N/A Rinsed/Irrigated with Saline, Other: soap and water Topical Anesthetic Applied: None Treatment Notes Electronic Signature(s) Signed: 09/04/2015 12:09:58 PM By: Gretta Cool, RN, BSN, Kim RN, BSN Entered By: Gretta Cool, RN, BSN, Kim on 09/03/2015 14:04:03 [name] [name] ([id number]) -------------------------------------------------------------------------------- Multi-Disciplinary Care Plan Details Patient Name: [name] [name] Date of Service: 09/03/2015 1:30 PM Medical Record Number: [id number] Patient Account Number: [id number] Date of Birth/Sex: Nov 16, 1926 (82 y.o. Male) Treating RN: Cornell Barman Primary Care Physician: Alvester Chou Other Clinician: Referring Physician: Alvester Chou Treating Physician/Extender: Frann Rider in Treatment: 21 Active Inactive Electronic Signature(s) Signed: 09/14/2015 10:22:26 AM By: Gretta Cool, RN, BSN, Kim RN, BSN Previous Signature: 09/04/2015 12:09:58 PM Version By: Gretta Cool, RN, BSN, Kim RN, BSN Entered By: Gretta Cool, RN, BSN, Kim on 09/14/2015 10:22:26 [name] [name] ([id number]) -------------------------------------------------------------------------------- Pain Assessment  Details Patient Name: [name] [name] Date of Service: 09/03/2015 1:30 PM Medical Record Number: [id number] Patient Account Number: [id number] Date of Birth/Sex: 1927-01-24 (82 y.o. Male) Treating RN: Cornell Barman Primary Care Physician: Alvester Chou Other Clinician: Referring Physician: Alvester Chou Treating Physician/Extender: Frann Rider in Treatment: 21 Active Problems Location of Pain Severity and Description of Pain Patient Has Paino No Site Locations Pain Management and Medication Current Pain Management: Electronic Signature(s) Signed: 09/04/2015 12:09:58 PM By: Gretta Cool, RN, BSN, Kim RN, BSN Entered By: Gretta Cool, RN, BSN, Kim on 09/03/2015 13:51:32 [name] [name] ([id number]) -------------------------------------------------------------------------------- Patient/Caregiver Education Details Patient Name: [name] [name] Date of Service: 09/03/2015 1:30 PM Medical Record Number: [id number] Patient Account Number: [id number] Date of Birth/Gender: [birth year] (82 y.o. Male) Treating RN: Cornell Barman Primary Care Physician: Alvester Chou Other Clinician: Referring Physician: Alvester Chou Treating Physician/Extender: Frann Rider in Treatment: 21 Education Assessment Education Provided To: Patient Education Topics Provided Wound/Skin Impairment: Caring for Your Ulcer, Other: do not get dressing wet, do not cut off unnas, call [facility] for Handouts: assistance Methods: Demonstration, Explain/Verbal Responses: State content correctly Electronic Signature(s) Signed: 09/04/2015 12:09:58 PM By: Gretta Cool, RN, BSN, Kim RN, BSN Entered By: Gretta Cool, RN, BSN, Kim on 09/03/2015 14:29:38 Trenkamp, [name] ([id number]) --------------------------------------------------------------------------------  Wound Assessment Details Patient Name: [name] [name], [name] [name] Date of Service: 09/03/2015 1:30 PM Medical Record Number: [id number] Patient Account Number: [id number] Date of Birth/Sex: [birth year] (82 y.o. Male) Treating RN: Cornell Barman Primary Care Physician: Alvester Chou Other Clinician: Referring Physician: Alvester Chou Treating Physician/Extender: Frann Rider in Treatment: 21 Wound Status Wound Number: 1 Primary Lymphedema Etiology: Wound Location: Right Lower Leg - Circumfernential Wound Open Status: Wounding Event: Gradually Appeared Comorbid Congestive Heart Failure, Date Acquired: 12/28/2014 History: Hypertension, Type II Diabetes, Weeks Of Treatment: 21 Neuropathy Clustered Wound: Yes Photos Photo Uploaded By: Gretta Cool, RN, BSN, Kim on 09/03/2015 16:35:19 Wound Measurements Length: (cm) 12 Width: (cm) 10 Depth: (cm) 0.2 Area: (cm) 94.248 Volume: (cm) 18.85 % Reduction in Area: 75.7% % Reduction in Volume: 51.4% Epithelialization: Medium (34-66%) Wound Description Classification: Partial Thickness Foul Odor Af Diabetic Severity (Wagner): Grade 1 Due to Produ Wound Margin: Flat and Intact Exudate Amount: Large Exudate Type: Serosanguineous Exudate Color: red, brown ter Cleansing: Yes ct Use: No Wound Bed Granulation Amount: Medium (34-66%) Exposed Structure Granulation Quality: Pink Fascia Exposed: No Igarashi, Navin ([id number]) Necrotic Amount: Medium (34-66%) Fat Layer Exposed: No Necrotic Quality: Adherent Slough Tendon Exposed: No Muscle Exposed: No Joint Exposed: No Bone Exposed: No Limited to Skin Breakdown Periwound Skin Texture Texture Color No Abnormalities Noted: No No Abnormalities Noted: No Callus: No Atrophie Blanche: No Crepitus: No Cyanosis: No Excoriation: No Ecchymosis: No Fluctuance: No Erythema: No Friable: No Hemosiderin Staining: No Induration: No Mottled: No Localized Edema: No Pallor: No Rash: No Rubor: No Scarring: No Temperature / Pain Moisture Temperature: No Abnormality No Abnormalities Noted: No Tenderness on Palpation: Yes Dry / Scaly: No Maceration: Yes Moist: Yes Wound Preparation Ulcer Cleansing: Rinsed/Irrigated with  Saline, Other: soap and water, Topical Anesthetic Applied: None Electronic Signature(s) Signed: 09/04/2015 12:09:58 PM By: Gretta Cool, RN, BSN, Kim RN, BSN Entered By: Gretta Cool, RN, BSN, Kim on 09/03/2015 14:00:57 [name] [name] ([id number]) -------------------------------------------------------------------------------- Wound Assessment Details Patient Name: [name] [name] Date of Service: 09/03/2015 1:30 PM Medical Record Number: [id number] Patient Account Number: [id number] Date of Birth/Sex: May 30, 1926 (82 y.o. Male) Treating RN: Cornell Barman Primary Care Physician: Alvester Chou Other Clinician: Referring Physician: Alvester Chou Treating Physician/Extender: Frann Rider in Treatment: 21 Wound Status Wound Number: 2 Primary Etiology: Lymphedema Wound Location: Left, Circumferential Lower Leg Wound Status: Open Wounding Event: Gradually Appeared Date Acquired: 12/28/2014 Weeks Of Treatment: 21 Clustered Wound: Yes Photos Photo Uploaded By: Gretta Cool, RN, BSN, Kim on 09/03/2015 16:35:20 Wound Measurements Length: (cm) 12 Width: (cm) 32 Depth: (cm) 0.2 Area: (cm) 301.593 Volume: (cm) 60.319 % Reduction in Area: -14.3% % Reduction in Volume: -128.6% Wound Description Classification: Partial Thickness Periwound Skin Texture Texture Color No Abnormalities Noted: No No Abnormalities Noted: No Moisture No Abnormalities Noted: No Electronic Signature(s) Signed: 09/04/2015 12:09:58 PM By: Gretta Cool, RN, BSN, Kim RN, BSN Liaw, [name] [name] ([id number]) Entered By: Gretta Cool, RN, BSN, Kim on 09/03/2015 14:00:35 [name] [name] ([id number]) -------------------------------------------------------------------------------- Wound Assessment Details Patient Name: [name] [name] Date of Service: 09/03/2015 1:30 PM Medical Record Number: [id number] Patient Account Number: [id number] Date of Birth/Sex: [birth year] (82 y.o. Male) Treating RN: Cornell Barman Primary Care Physician: Alvester Chou Other Clinician: Referring  Physician: Alvester Chou Treating Physician/Extender: Frann Rider in Treatment: 21 Wound Status Wound Number: 3 Primary Etiology: Lymphedema Wound Location: Left, Plantar Foot Wound Status: Open Wounding Event: Gradually Appeared Date Acquired: 01/27/2015 Weeks Of Treatment: 21 Clustered Wound: No Photos Photo Uploaded By: Gretta Cool, RN, BSN, Kim on 09/03/2015 16:36:19 Wound Measurements Length: (  cm) 0.5 Width: (cm) 2.1 Depth: (cm) 0.1 Area: (cm) 0.825 Volume: (cm) 0.082 % Reduction in Area: 89.1% % Reduction in Volume: 89.1% Wound Description Classification: Partial Thickness Periwound Skin Texture Texture Color No Abnormalities Noted: No No Abnormalities Noted: No Moisture No Abnormalities Noted: No Electronic Signature(s) Signed: 09/04/2015 12:09:58 PM By: Gretta Cool, RN, BSN, Kim RN, BSN Shake, [name] [name] ([id number]) Entered By: Gretta Cool, RN, BSN, Kim on 09/03/2015 14:00:35 [name] [name] ([id number]) -------------------------------------------------------------------------------- Vitals Details Patient Name: [name] [name] Date of Service: 09/03/2015 1:30 PM Medical Record Number: [id number] Patient Account Number: [id number] Date of Birth/Sex: 01/19/1927 (82 y.o. Male) Treating RN: Cornell Barman Primary Care Physician: Alvester Chou Other Clinician: Referring Physician: Alvester Chou Treating Physician/Extender: Frann Rider in Treatment: 21 Vital Signs Time Taken: 13:51 Temperature (F): 97.5 Height (in): 70 Pulse (bpm): 91 Weight (lbs): 230 Respiratory Rate (breaths/min): 18 Body Mass Index (BMI): 33 Blood Pressure (mmHg): 132/55 Reference Range: 80 - 120 mg / dl Electronic Signature(s) Signed: 09/04/2015 12:09:58 PM By: Gretta Cool, RN, BSN, Kim RN, BSN Entered By: Gretta Cool, RN, BSN, Kim on 09/03/2015 13:51:52

## 2015-09-06 DIAGNOSIS — E11622 Type 2 diabetes mellitus with other skin ulcer: Secondary | ICD-10-CM | POA: Diagnosis not present

## 2015-09-07 NOTE — Progress Notes (Signed)
SAAMIR, SELINSKY ([id number]) Visit Report for 09/06/2015 Arrival Information Details Patient Name: ULRIC, USEY Date of Service: 09/06/2015 3:45 PM Medical Record Number: [id number] Patient Account Number: [id number] Date of Birth/Sex: 1926-05-22 (81 y.o. Male) Treating RN: Baruch Gouty, RN, BSN, Velva Harman Primary Care Physician: Alvester Chou Other Clinician: Referring Physician: Alvester Chou Treating Physician/Extender: Frann Rider in Treatment: 21 Visit Information History Since Last Visit Added or deleted any medications: No Patient Arrived: Wheel Chair Any new allergies or adverse reactions: No Arrival Time: 15:54 Had a fall or experienced change in No activities of daily living that may affect Accompanied By: self risk of falls: Transfer Assistance: None Signs or symptoms of abuse/neglect since last No Patient Identification Verified: Yes visito Secondary Verification Process Yes Hospitalized since last visit: No Completed: Has Dressing in Place as Prescribed: Yes Patient Has Alerts: Yes Has Compression in Place as Prescribed: Yes Pain Present Now: No Electronic Signature(s) Signed: 09/06/2015 3:55:18 PM By: Regan Lemming BSN, RN Entered By: Regan Lemming on 09/06/2015 15:55:18 [name] ([id number]) -------------------------------------------------------------------------------- Encounter Discharge Information Details Patient Name: [name] Date of Service: 09/06/2015 3:45 PM Medical Record Number: [id number] Patient Account Number: [id number] Date of Birth/Sex: 23-Sep-1926 (82 y.o. Male) Treating RN: Baruch Gouty, RN, BSN, Velva Harman Primary Care Physician: Alvester Chou Other Clinician: Referring Physician: Alvester Chou Treating Physician/Extender: Frann Rider in Treatment: 21 Encounter Discharge Information Items Discharge Pain Level: 0 Discharge Condition: Stable Ambulatory Status: Walker Discharge Destination: Home Private Transportation: Auto Accompanied By: self Schedule  Follow-up Appointment: No Medication Reconciliation completed and No provided to Patient/Care Sheldon Amara: Clinical Summary of Care: Electronic Signature(s) Signed: 09/06/2015 4:40:57 PM By: Regan Lemming BSN, RN Entered By: Regan Lemming on 09/06/2015 16:40:57 [name] ([id number]) -------------------------------------------------------------------------------- Patient/Caregiver Education Details Patient Name: [name] Date of Service: 09/06/2015 3:45 PM Medical Record Number: [id number] Patient Account Number: [id number] Date of Birth/Gender: [birth year] (82 y.o. Male) Treating RN: Baruch Gouty, RN, BSN, Velva Harman Primary Care Physician: Alvester Chou Other Clinician: Referring Physician: Alvester Chou Treating Physician/Extender: Frann Rider in Treatment: 21 Education Assessment Education Provided To: Patient Education Topics Provided Venous: Methods: Explain/Verbal Responses: State content correctly Welcome To [facility]: Methods: Explain/Verbal Responses: State content correctly Electronic Signature(s) Signed: 09/06/2015 4:40:33 PM By: Regan Lemming BSN, RN Entered By: Regan Lemming on 09/06/2015 16:40:33

## 2015-09-11 ENCOUNTER — Encounter (HOSPITAL_BASED_OUTPATIENT_CLINIC_OR_DEPARTMENT_OTHER): Payer: Medicare Other | Attending: Surgery

## 2015-09-11 DIAGNOSIS — I70248 Atherosclerosis of native arteries of left leg with ulceration of other part of lower left leg: Secondary | ICD-10-CM | POA: Insufficient documentation

## 2015-09-11 DIAGNOSIS — M109 Gout, unspecified: Secondary | ICD-10-CM | POA: Diagnosis not present

## 2015-09-11 DIAGNOSIS — E1122 Type 2 diabetes mellitus with diabetic chronic kidney disease: Secondary | ICD-10-CM | POA: Diagnosis not present

## 2015-09-11 DIAGNOSIS — L97521 Non-pressure chronic ulcer of other part of left foot limited to breakdown of skin: Secondary | ICD-10-CM | POA: Diagnosis not present

## 2015-09-11 DIAGNOSIS — I130 Hypertensive heart and chronic kidney disease with heart failure and stage 1 through stage 4 chronic kidney disease, or unspecified chronic kidney disease: Secondary | ICD-10-CM | POA: Insufficient documentation

## 2015-09-11 DIAGNOSIS — N183 Chronic kidney disease, stage 3 (moderate): Secondary | ICD-10-CM | POA: Diagnosis not present

## 2015-09-11 DIAGNOSIS — Z87891 Personal history of nicotine dependence: Secondary | ICD-10-CM | POA: Diagnosis not present

## 2015-09-11 DIAGNOSIS — I5032 Chronic diastolic (congestive) heart failure: Secondary | ICD-10-CM | POA: Diagnosis not present

## 2015-09-11 DIAGNOSIS — L97811 Non-pressure chronic ulcer of other part of right lower leg limited to breakdown of skin: Secondary | ICD-10-CM | POA: Diagnosis not present

## 2015-09-11 DIAGNOSIS — I87332 Chronic venous hypertension (idiopathic) with ulcer and inflammation of left lower extremity: Secondary | ICD-10-CM | POA: Diagnosis not present

## 2015-09-11 DIAGNOSIS — I87311 Chronic venous hypertension (idiopathic) with ulcer of right lower extremity: Secondary | ICD-10-CM | POA: Diagnosis not present

## 2015-09-11 DIAGNOSIS — L97221 Non-pressure chronic ulcer of left calf limited to breakdown of skin: Secondary | ICD-10-CM | POA: Insufficient documentation

## 2015-09-11 DIAGNOSIS — E11622 Type 2 diabetes mellitus with other skin ulcer: Secondary | ICD-10-CM | POA: Diagnosis present

## 2015-09-11 DIAGNOSIS — I890 Lymphedema, not elsewhere classified: Secondary | ICD-10-CM | POA: Diagnosis not present

## 2015-09-11 DIAGNOSIS — I70238 Atherosclerosis of native arteries of right leg with ulceration of other part of lower right leg: Secondary | ICD-10-CM | POA: Insufficient documentation

## 2015-09-18 DIAGNOSIS — E11622 Type 2 diabetes mellitus with other skin ulcer: Secondary | ICD-10-CM | POA: Diagnosis not present

## 2015-09-25 DIAGNOSIS — E11622 Type 2 diabetes mellitus with other skin ulcer: Secondary | ICD-10-CM | POA: Diagnosis not present

## 2015-10-02 ENCOUNTER — Encounter (HOSPITAL_BASED_OUTPATIENT_CLINIC_OR_DEPARTMENT_OTHER): Payer: Medicare Other | Attending: Surgery

## 2015-10-02 DIAGNOSIS — E1122 Type 2 diabetes mellitus with diabetic chronic kidney disease: Secondary | ICD-10-CM | POA: Insufficient documentation

## 2015-10-02 DIAGNOSIS — M109 Gout, unspecified: Secondary | ICD-10-CM | POA: Diagnosis not present

## 2015-10-02 DIAGNOSIS — I70248 Atherosclerosis of native arteries of left leg with ulceration of other part of lower left leg: Secondary | ICD-10-CM | POA: Diagnosis not present

## 2015-10-02 DIAGNOSIS — I70238 Atherosclerosis of native arteries of right leg with ulceration of other part of lower right leg: Secondary | ICD-10-CM | POA: Insufficient documentation

## 2015-10-02 DIAGNOSIS — L97821 Non-pressure chronic ulcer of other part of left lower leg limited to breakdown of skin: Secondary | ICD-10-CM | POA: Insufficient documentation

## 2015-10-02 DIAGNOSIS — I129 Hypertensive chronic kidney disease with stage 1 through stage 4 chronic kidney disease, or unspecified chronic kidney disease: Secondary | ICD-10-CM | POA: Insufficient documentation

## 2015-10-02 DIAGNOSIS — F0390 Unspecified dementia without behavioral disturbance: Secondary | ICD-10-CM | POA: Insufficient documentation

## 2015-10-02 DIAGNOSIS — L97521 Non-pressure chronic ulcer of other part of left foot limited to breakdown of skin: Secondary | ICD-10-CM | POA: Diagnosis not present

## 2015-10-02 DIAGNOSIS — N189 Chronic kidney disease, unspecified: Secondary | ICD-10-CM | POA: Diagnosis not present

## 2015-10-02 DIAGNOSIS — I87311 Chronic venous hypertension (idiopathic) with ulcer of right lower extremity: Secondary | ICD-10-CM | POA: Insufficient documentation

## 2015-10-02 DIAGNOSIS — I70245 Atherosclerosis of native arteries of left leg with ulceration of other part of foot: Secondary | ICD-10-CM | POA: Insufficient documentation

## 2015-10-02 DIAGNOSIS — I87332 Chronic venous hypertension (idiopathic) with ulcer and inflammation of left lower extremity: Secondary | ICD-10-CM | POA: Diagnosis not present

## 2015-10-02 DIAGNOSIS — L97811 Non-pressure chronic ulcer of other part of right lower leg limited to breakdown of skin: Secondary | ICD-10-CM | POA: Insufficient documentation

## 2015-10-02 DIAGNOSIS — E11622 Type 2 diabetes mellitus with other skin ulcer: Secondary | ICD-10-CM | POA: Insufficient documentation

## 2015-10-02 DIAGNOSIS — E1151 Type 2 diabetes mellitus with diabetic peripheral angiopathy without gangrene: Secondary | ICD-10-CM | POA: Diagnosis not present

## 2015-10-02 DIAGNOSIS — I890 Lymphedema, not elsewhere classified: Secondary | ICD-10-CM | POA: Insufficient documentation

## 2015-10-09 DIAGNOSIS — E11622 Type 2 diabetes mellitus with other skin ulcer: Secondary | ICD-10-CM | POA: Diagnosis not present

## 2015-10-16 DIAGNOSIS — E11622 Type 2 diabetes mellitus with other skin ulcer: Secondary | ICD-10-CM | POA: Diagnosis not present

## 2015-10-23 DIAGNOSIS — E11622 Type 2 diabetes mellitus with other skin ulcer: Secondary | ICD-10-CM | POA: Diagnosis not present

## 2015-11-06 ENCOUNTER — Encounter (HOSPITAL_BASED_OUTPATIENT_CLINIC_OR_DEPARTMENT_OTHER): Payer: Medicare Other | Attending: Surgery

## 2015-11-06 DIAGNOSIS — I70238 Atherosclerosis of native arteries of right leg with ulceration of other part of lower right leg: Secondary | ICD-10-CM | POA: Insufficient documentation

## 2015-11-06 DIAGNOSIS — L97821 Non-pressure chronic ulcer of other part of left lower leg limited to breakdown of skin: Secondary | ICD-10-CM | POA: Diagnosis not present

## 2015-11-06 DIAGNOSIS — F0390 Unspecified dementia without behavioral disturbance: Secondary | ICD-10-CM | POA: Insufficient documentation

## 2015-11-06 DIAGNOSIS — I70248 Atherosclerosis of native arteries of left leg with ulceration of other part of lower left leg: Secondary | ICD-10-CM | POA: Insufficient documentation

## 2015-11-06 DIAGNOSIS — E11621 Type 2 diabetes mellitus with foot ulcer: Secondary | ICD-10-CM | POA: Insufficient documentation

## 2015-11-06 DIAGNOSIS — I890 Lymphedema, not elsewhere classified: Secondary | ICD-10-CM | POA: Diagnosis not present

## 2015-11-06 DIAGNOSIS — I10 Essential (primary) hypertension: Secondary | ICD-10-CM | POA: Insufficient documentation

## 2015-11-06 DIAGNOSIS — L97521 Non-pressure chronic ulcer of other part of left foot limited to breakdown of skin: Secondary | ICD-10-CM | POA: Insufficient documentation

## 2015-11-06 DIAGNOSIS — L97811 Non-pressure chronic ulcer of other part of right lower leg limited to breakdown of skin: Secondary | ICD-10-CM | POA: Insufficient documentation

## 2015-11-13 DIAGNOSIS — E11621 Type 2 diabetes mellitus with foot ulcer: Secondary | ICD-10-CM | POA: Diagnosis not present

## 2015-11-15 ENCOUNTER — Encounter: Payer: Self-pay | Admitting: Family

## 2015-11-20 DIAGNOSIS — E11621 Type 2 diabetes mellitus with foot ulcer: Secondary | ICD-10-CM | POA: Diagnosis not present

## 2015-11-21 ENCOUNTER — Ambulatory Visit (HOSPITAL_COMMUNITY): Payer: Medicare Other

## 2015-11-21 ENCOUNTER — Ambulatory Visit: Payer: Medicare Other | Admitting: Family

## 2015-11-27 ENCOUNTER — Encounter (HOSPITAL_BASED_OUTPATIENT_CLINIC_OR_DEPARTMENT_OTHER): Payer: Medicare Other | Attending: Surgery

## 2015-11-27 DIAGNOSIS — I70238 Atherosclerosis of native arteries of right leg with ulceration of other part of lower right leg: Secondary | ICD-10-CM | POA: Diagnosis not present

## 2015-11-27 DIAGNOSIS — I70248 Atherosclerosis of native arteries of left leg with ulceration of other part of lower left leg: Secondary | ICD-10-CM | POA: Insufficient documentation

## 2015-11-27 DIAGNOSIS — F0390 Unspecified dementia without behavioral disturbance: Secondary | ICD-10-CM | POA: Diagnosis not present

## 2015-11-27 DIAGNOSIS — E11621 Type 2 diabetes mellitus with foot ulcer: Secondary | ICD-10-CM | POA: Insufficient documentation

## 2015-11-27 DIAGNOSIS — N189 Chronic kidney disease, unspecified: Secondary | ICD-10-CM | POA: Insufficient documentation

## 2015-11-27 DIAGNOSIS — I890 Lymphedema, not elsewhere classified: Secondary | ICD-10-CM | POA: Diagnosis not present

## 2015-11-27 DIAGNOSIS — L97821 Non-pressure chronic ulcer of other part of left lower leg limited to breakdown of skin: Secondary | ICD-10-CM | POA: Diagnosis not present

## 2015-11-27 DIAGNOSIS — L97221 Non-pressure chronic ulcer of left calf limited to breakdown of skin: Secondary | ICD-10-CM | POA: Insufficient documentation

## 2015-11-27 DIAGNOSIS — L97811 Non-pressure chronic ulcer of other part of right lower leg limited to breakdown of skin: Secondary | ICD-10-CM | POA: Diagnosis not present

## 2015-11-27 DIAGNOSIS — L97521 Non-pressure chronic ulcer of other part of left foot limited to breakdown of skin: Secondary | ICD-10-CM | POA: Insufficient documentation

## 2015-11-27 DIAGNOSIS — I129 Hypertensive chronic kidney disease with stage 1 through stage 4 chronic kidney disease, or unspecified chronic kidney disease: Secondary | ICD-10-CM | POA: Diagnosis not present

## 2015-11-27 DIAGNOSIS — E1122 Type 2 diabetes mellitus with diabetic chronic kidney disease: Secondary | ICD-10-CM | POA: Diagnosis not present

## 2015-12-04 DIAGNOSIS — E11621 Type 2 diabetes mellitus with foot ulcer: Secondary | ICD-10-CM | POA: Diagnosis not present

## 2015-12-11 DIAGNOSIS — E11621 Type 2 diabetes mellitus with foot ulcer: Secondary | ICD-10-CM | POA: Diagnosis not present

## 2015-12-18 DIAGNOSIS — E11621 Type 2 diabetes mellitus with foot ulcer: Secondary | ICD-10-CM | POA: Diagnosis not present

## 2015-12-25 DIAGNOSIS — E11621 Type 2 diabetes mellitus with foot ulcer: Secondary | ICD-10-CM | POA: Diagnosis not present

## 2016-01-02 ENCOUNTER — Encounter (HOSPITAL_BASED_OUTPATIENT_CLINIC_OR_DEPARTMENT_OTHER): Payer: Medicare Other | Attending: Surgery

## 2016-01-02 DIAGNOSIS — N189 Chronic kidney disease, unspecified: Secondary | ICD-10-CM | POA: Insufficient documentation

## 2016-01-02 DIAGNOSIS — E1122 Type 2 diabetes mellitus with diabetic chronic kidney disease: Secondary | ICD-10-CM | POA: Insufficient documentation

## 2016-01-02 DIAGNOSIS — I890 Lymphedema, not elsewhere classified: Secondary | ICD-10-CM | POA: Insufficient documentation

## 2016-01-02 DIAGNOSIS — L97811 Non-pressure chronic ulcer of other part of right lower leg limited to breakdown of skin: Secondary | ICD-10-CM | POA: Insufficient documentation

## 2016-01-02 DIAGNOSIS — I87311 Chronic venous hypertension (idiopathic) with ulcer of right lower extremity: Secondary | ICD-10-CM | POA: Insufficient documentation

## 2016-01-02 DIAGNOSIS — L97821 Non-pressure chronic ulcer of other part of left lower leg limited to breakdown of skin: Secondary | ICD-10-CM | POA: Insufficient documentation

## 2016-01-02 DIAGNOSIS — I87332 Chronic venous hypertension (idiopathic) with ulcer and inflammation of left lower extremity: Secondary | ICD-10-CM | POA: Insufficient documentation

## 2016-01-02 DIAGNOSIS — I129 Hypertensive chronic kidney disease with stage 1 through stage 4 chronic kidney disease, or unspecified chronic kidney disease: Secondary | ICD-10-CM | POA: Insufficient documentation

## 2016-01-02 DIAGNOSIS — I70238 Atherosclerosis of native arteries of right leg with ulceration of other part of lower right leg: Secondary | ICD-10-CM | POA: Insufficient documentation

## 2016-01-02 DIAGNOSIS — L97521 Non-pressure chronic ulcer of other part of left foot limited to breakdown of skin: Secondary | ICD-10-CM | POA: Insufficient documentation

## 2016-01-02 DIAGNOSIS — E11622 Type 2 diabetes mellitus with other skin ulcer: Secondary | ICD-10-CM | POA: Insufficient documentation

## 2016-01-02 DIAGNOSIS — I70248 Atherosclerosis of native arteries of left leg with ulceration of other part of lower left leg: Secondary | ICD-10-CM | POA: Insufficient documentation

## 2016-01-09 DIAGNOSIS — L97521 Non-pressure chronic ulcer of other part of left foot limited to breakdown of skin: Secondary | ICD-10-CM | POA: Diagnosis not present

## 2016-01-09 DIAGNOSIS — E1122 Type 2 diabetes mellitus with diabetic chronic kidney disease: Secondary | ICD-10-CM | POA: Diagnosis not present

## 2016-01-09 DIAGNOSIS — E11622 Type 2 diabetes mellitus with other skin ulcer: Secondary | ICD-10-CM | POA: Diagnosis not present

## 2016-01-09 DIAGNOSIS — L97811 Non-pressure chronic ulcer of other part of right lower leg limited to breakdown of skin: Secondary | ICD-10-CM | POA: Diagnosis not present

## 2016-01-09 DIAGNOSIS — I87311 Chronic venous hypertension (idiopathic) with ulcer of right lower extremity: Secondary | ICD-10-CM | POA: Diagnosis not present

## 2016-01-09 DIAGNOSIS — I890 Lymphedema, not elsewhere classified: Secondary | ICD-10-CM | POA: Diagnosis not present

## 2016-01-09 DIAGNOSIS — I87332 Chronic venous hypertension (idiopathic) with ulcer and inflammation of left lower extremity: Secondary | ICD-10-CM | POA: Diagnosis not present

## 2016-01-09 DIAGNOSIS — L97821 Non-pressure chronic ulcer of other part of left lower leg limited to breakdown of skin: Secondary | ICD-10-CM | POA: Diagnosis not present

## 2016-01-09 DIAGNOSIS — I70248 Atherosclerosis of native arteries of left leg with ulceration of other part of lower left leg: Secondary | ICD-10-CM | POA: Diagnosis not present

## 2016-01-09 DIAGNOSIS — N189 Chronic kidney disease, unspecified: Secondary | ICD-10-CM | POA: Diagnosis not present

## 2016-01-09 DIAGNOSIS — I129 Hypertensive chronic kidney disease with stage 1 through stage 4 chronic kidney disease, or unspecified chronic kidney disease: Secondary | ICD-10-CM | POA: Diagnosis not present

## 2016-01-09 DIAGNOSIS — I70238 Atherosclerosis of native arteries of right leg with ulceration of other part of lower right leg: Secondary | ICD-10-CM | POA: Diagnosis not present

## 2016-01-14 ENCOUNTER — Encounter: Payer: Self-pay | Admitting: Family

## 2016-01-14 ENCOUNTER — Encounter (HOSPITAL_COMMUNITY): Payer: Medicare Other

## 2016-01-15 DIAGNOSIS — E11622 Type 2 diabetes mellitus with other skin ulcer: Secondary | ICD-10-CM | POA: Diagnosis not present

## 2016-01-18 ENCOUNTER — Ambulatory Visit: Payer: Medicare Other | Admitting: Family

## 2016-01-18 ENCOUNTER — Ambulatory Visit (HOSPITAL_COMMUNITY): Admission: RE | Admit: 2016-01-18 | Payer: Medicare Other | Source: Ambulatory Visit

## 2016-01-22 DIAGNOSIS — E11622 Type 2 diabetes mellitus with other skin ulcer: Secondary | ICD-10-CM | POA: Diagnosis not present

## 2016-01-29 ENCOUNTER — Encounter (HOSPITAL_BASED_OUTPATIENT_CLINIC_OR_DEPARTMENT_OTHER): Payer: Medicare Other | Attending: Surgery

## 2016-01-29 DIAGNOSIS — I87332 Chronic venous hypertension (idiopathic) with ulcer and inflammation of left lower extremity: Secondary | ICD-10-CM | POA: Diagnosis not present

## 2016-01-29 DIAGNOSIS — S81811A Laceration without foreign body, right lower leg, initial encounter: Secondary | ICD-10-CM | POA: Insufficient documentation

## 2016-01-29 DIAGNOSIS — L97821 Non-pressure chronic ulcer of other part of left lower leg limited to breakdown of skin: Secondary | ICD-10-CM | POA: Diagnosis not present

## 2016-01-29 DIAGNOSIS — I87311 Chronic venous hypertension (idiopathic) with ulcer of right lower extremity: Secondary | ICD-10-CM | POA: Diagnosis not present

## 2016-01-29 DIAGNOSIS — L97811 Non-pressure chronic ulcer of other part of right lower leg limited to breakdown of skin: Secondary | ICD-10-CM | POA: Insufficient documentation

## 2016-01-29 DIAGNOSIS — F0390 Unspecified dementia without behavioral disturbance: Secondary | ICD-10-CM | POA: Insufficient documentation

## 2016-01-29 DIAGNOSIS — E1151 Type 2 diabetes mellitus with diabetic peripheral angiopathy without gangrene: Secondary | ICD-10-CM | POA: Insufficient documentation

## 2016-01-29 DIAGNOSIS — S81802A Unspecified open wound, left lower leg, initial encounter: Secondary | ICD-10-CM | POA: Diagnosis not present

## 2016-01-29 DIAGNOSIS — E1122 Type 2 diabetes mellitus with diabetic chronic kidney disease: Secondary | ICD-10-CM | POA: Diagnosis not present

## 2016-01-29 DIAGNOSIS — I890 Lymphedema, not elsewhere classified: Secondary | ICD-10-CM | POA: Insufficient documentation

## 2016-01-29 DIAGNOSIS — X58XXXA Exposure to other specified factors, initial encounter: Secondary | ICD-10-CM | POA: Insufficient documentation

## 2016-01-29 DIAGNOSIS — I129 Hypertensive chronic kidney disease with stage 1 through stage 4 chronic kidney disease, or unspecified chronic kidney disease: Secondary | ICD-10-CM | POA: Diagnosis not present

## 2016-01-29 DIAGNOSIS — I70238 Atherosclerosis of native arteries of right leg with ulceration of other part of lower right leg: Secondary | ICD-10-CM | POA: Insufficient documentation

## 2016-01-29 DIAGNOSIS — E11622 Type 2 diabetes mellitus with other skin ulcer: Secondary | ICD-10-CM | POA: Insufficient documentation

## 2016-01-29 DIAGNOSIS — I70248 Atherosclerosis of native arteries of left leg with ulceration of other part of lower left leg: Secondary | ICD-10-CM | POA: Diagnosis not present

## 2016-01-29 DIAGNOSIS — M109 Gout, unspecified: Secondary | ICD-10-CM | POA: Insufficient documentation

## 2016-01-29 DIAGNOSIS — N189 Chronic kidney disease, unspecified: Secondary | ICD-10-CM | POA: Insufficient documentation

## 2016-02-05 DIAGNOSIS — E11622 Type 2 diabetes mellitus with other skin ulcer: Secondary | ICD-10-CM | POA: Diagnosis not present

## 2016-02-12 DIAGNOSIS — E11622 Type 2 diabetes mellitus with other skin ulcer: Secondary | ICD-10-CM | POA: Diagnosis not present

## 2016-02-19 DIAGNOSIS — E11622 Type 2 diabetes mellitus with other skin ulcer: Secondary | ICD-10-CM | POA: Diagnosis not present

## 2016-09-07 ENCOUNTER — Encounter (HOSPITAL_COMMUNITY): Payer: Self-pay

## 2016-09-07 ENCOUNTER — Emergency Department (HOSPITAL_COMMUNITY)
Admission: EM | Admit: 2016-09-07 | Discharge: 2016-09-07 | Disposition: A | Discharge status: Home / Self Care | Payer: Medicare Other | Attending: Emergency Medicine | Admitting: Emergency Medicine

## 2016-09-07 DIAGNOSIS — R531 Weakness: Secondary | ICD-10-CM | POA: Diagnosis present

## 2016-09-07 DIAGNOSIS — Z79899 Other long term (current) drug therapy: Secondary | ICD-10-CM | POA: Insufficient documentation

## 2016-09-07 DIAGNOSIS — E1142 Type 2 diabetes mellitus with diabetic polyneuropathy: Secondary | ICD-10-CM | POA: Diagnosis not present

## 2016-09-07 DIAGNOSIS — E0842 Diabetes mellitus due to underlying condition with diabetic polyneuropathy: Secondary | ICD-10-CM

## 2016-09-07 DIAGNOSIS — N183 Chronic kidney disease, stage 3 (moderate): Secondary | ICD-10-CM | POA: Diagnosis not present

## 2016-09-07 DIAGNOSIS — E1122 Type 2 diabetes mellitus with diabetic chronic kidney disease: Secondary | ICD-10-CM | POA: Diagnosis not present

## 2016-09-07 DIAGNOSIS — I129 Hypertensive chronic kidney disease with stage 1 through stage 4 chronic kidney disease, or unspecified chronic kidney disease: Secondary | ICD-10-CM | POA: Insufficient documentation

## 2016-09-07 DIAGNOSIS — Z87891 Personal history of nicotine dependence: Secondary | ICD-10-CM | POA: Diagnosis not present

## 2016-09-07 LAB — CBG MONITORING, ED: Glucose-Capillary: 111 mg/dL — ABNORMAL HIGH (ref 65–99)

## 2016-09-07 MED ORDER — GABAPENTIN 300 MG PO CAPS
300.0000 mg | ORAL_CAPSULE | Freq: Once | ORAL | Status: AC
Start: 2016-09-07 — End: 2016-09-07
  Administered 2016-09-07: 300 mg via ORAL
  Filled 2016-09-07: qty 1

## 2016-09-07 MED ORDER — GABAPENTIN 600 MG PO TABS
300.0000 mg | ORAL_TABLET | Freq: Once | ORAL | Status: DC
  Filled 2016-09-07: qty 0.5

## 2016-09-07 MED ORDER — GABAPENTIN 100 MG PO CAPS: 300.0000 mg | ORAL_CAPSULE | Freq: Three times a day (TID) | ORAL | 0 refills | Status: DC

## 2016-09-07 NOTE — Progress Notes (Signed)
Orthopedic Tech Progress Note Patient Details:  [name] [name] 1926/08/07 [id number]  Ortho Devices Type of Ortho Device: Louretta Parma boot Ortho Device/Splint Location: (B) LE Ortho Device/Splint Interventions: Ordered, Application   [name] 09/07/2016, 4:56 PM

## 2016-09-07 NOTE — ED Provider Notes (Signed)
[facility] DEPT Provider Note   CSN: [id number] Arrival date & time: 09/07/16  1347     History   Chief Complaint Chief Complaint  Patient presents with  . Circulatory Problem    HPI [name] [name] is a 83 y.o. male.  Patient has a documented history of diabetes, lymphedema, venous stasis. He has nurses that changes his LE dressings 3x/week. Presents with several several years of bilateral lower extremity burning sensation. Was recently started on gabapentin 100 mg. Reports some mild improvement in symptoms, but persistence of pain. Denies any fevers or chills or infectious symptoms.   The history is provided by the patient.  Illness  This is a chronic problem. Episode onset: several years. The problem occurs constantly. The problem has been gradually worsening. Treatments tried: gabapentin. The treatment provided mild relief.    Past Medical History:  Diagnosis Date  . Chronic kidney disease    RENAL INSUFICCIENCY  . Diabetes mellitus without complication ([place])   . DVT (deep venous thrombosis) ([place])   . Gout   . Hypertension   . Lymphadenitis 08/10/2012    Patient Active Problem List   Diagnosis Date Noted  . Venous stasis ulcer of lower extremity ([place]) 05/23/2015  . Cellulitis of both lower extremities 10/18/2014  . CKD (chronic kidney disease) stage 3, GFR 30-59 ml/min 10/18/2014  . Hypertension 10/18/2014  . Diastolic dysfunction 27/06/5007  . Lower extremity cellulitis 10/18/2014  . PVD (peripheral vascular disease) ([place]) 02/02/2014  . Atherosclerosis of native arteries of the extremities with ulceration ([place]) 07/22/2013  . Acute renal failure ([place]) 11/15/2012  . Fall 11/15/2012  . Diabetic ulcer of left foot ([place]) 10/15/2012  . Diabetes mellitus ([place]) 10/15/2012  . Lymphadenitis 08/07/2012  . Cellulitis 08/07/2012  . HTN (hypertension) 08/05/2012  . Renal insufficiency 08/05/2012  . Tobacco abuse 08/05/2012  . Gout 08/05/2012  . Hyperglycemia 08/05/2012      History reviewed. No pertinent surgical history.     Home Medications    Prior to Admission medications   Medication Sig Start Date End Date Taking? Authorizing Provider  allopurinol (ZYLOPRIM) 300 MG tablet Take 300 mg by mouth daily.   Yes [provider]  furosemide (LASIX) 20 MG tablet Take 20 mg by mouth 3 (three) times daily.   Yes [provider]  losartan (COZAAR) 50 MG tablet Take 50 mg by mouth daily.   Yes [provider]  triamcinolone ointment (KENALOG) 0.1 % Apply 1 application topically 2 (two) times daily.   Yes [provider]  verapamil (VERELAN) 100 MG 24 hr capsule Take 100 mg by mouth 2 (two) times daily.   Yes [provider]  Vitamin D, Ergocalciferol, (DRISDOL) 50000 units CAPS capsule Take 50,000 Units by mouth every 7 (seven) days.   Yes [provider]  cephALEXin (KEFLEX) 500 MG capsule Take 1 capsule (500 mg total) by mouth 3 (three) times daily. Patient not taking: Reported on 05/23/2015 10/22/14   Verlee Monte, MD  doxycycline (VIBRA-TABS) 100 MG tablet Take 1 tablet (100 mg total) by mouth 2 (two) times daily. Patient not taking: Reported on 05/23/2015 10/22/14   Verlee Monte, MD  furosemide (LASIX) 40 MG tablet Take 40 mg by mouth daily.    [provider]  gabapentin (NEURONTIN) 100 MG capsule Take 3 capsules (300 mg total) by mouth 3 (three) times daily. 09/07/16 10/07/16  Maryan Puls, MD  losartan (COZAAR) 25 MG tablet Take 25 mg by mouth daily.    [provider]  verapamil (CALAN-SR) 240 MG CR tablet Take 240 mg by mouth daily.     [provider]    Family History Family History  Problem Relation Age of Onset  . Hypertension Mother   . Stroke Mother   . Hypertension Father     Social History Social History  Substance Use Topics  . Smoking status: Former Smoker    Years: 40.00    Types: Pipe  . Smokeless tobacco: Never Used  . Alcohol use No      Allergies   Patient has no known allergies.   Review of Systems Review of Systems   Physical Exam Updated Vital Signs BP (!) 124/113   Pulse 81   Temp 98.5 F (36.9 C) (Oral)   Resp (!) 23   SpO2 100%   Physical Exam   ED Treatments / Results  Labs (all labs ordered are listed, but only abnormal results are displayed) Labs Reviewed  CBG MONITORING, ED - Abnormal; Notable for the following:       Result Value   Glucose-Capillary 111 (*)    All other components within normal limits    EKG  EKG Interpretation None       Radiology No results found.  Procedures Procedures (including critical care time)  Medications Ordered in ED Medications  gabapentin (NEURONTIN) capsule 300 mg (300 mg Oral Given 09/07/16 1744)     Initial Impression / Assessment and Plan / ED Course  I have reviewed the triage vital signs and the nursing notes.  Pertinent labs & imaging results that were available during my care of the patient were reviewed by me and considered in my medical decision making (see chart for details).     Description of symptoms seems most consistent with diabetic neuropathy. The patient is on gabapentin, but only 100 mg daily. Gave him 300 mg here. We'll give him a new prescription with a bigger dose and more frequent. His lower extremity dressings were taken off. He does have chronic wounds in his bilateral lower extremities. No evidence of gross infection. Vital signs normal and stable here. No evidence of cellulitis or abscess formation. He is palpable distal pulses. Diminished sensation that is consistent with his baseline. No unilateral swelling that would be consistent with a DVT. We'll have him follow-up with his primary care doctor as well as his at home nursing staff that helps change his wounds multiple times per week.  Final Clinical Impressions(s) / ED Diagnoses   Final diagnoses:  Diabetic polyneuropathy associated with diabetes mellitus due  to underlying condition [facility])    New Prescriptions Discharge Medication List as of 09/07/2016  4:13 PM       Maryan Puls, MD 09/07/16 7425    Pattricia Boss, MD 09/13/16 1144

## 2016-09-07 NOTE — ED Notes (Signed)
Pt waiting for daughter to come get him

## 2016-09-07 NOTE — ED Notes (Signed)
Ed res has unwrapped both his legs  Ortho will place unna boots both legs

## 2016-09-07 NOTE — ED Triage Notes (Signed)
Onset several months pt has been having burning sensation in LE.  Pt thinks it is d/t poor circulation.

## 2016-11-17 ENCOUNTER — Inpatient Hospital Stay (HOSPITAL_COMMUNITY)
Admission: EM | Admit: 2016-11-17 | Discharge: 2016-11-20 | DRG: 300 | Disposition: A | Discharge status: Home Health | Payer: Medicare Other | Attending: Internal Medicine | Admitting: Internal Medicine

## 2016-11-17 ENCOUNTER — Encounter (HOSPITAL_COMMUNITY): Payer: Self-pay | Admitting: Nurse Practitioner

## 2016-11-17 DIAGNOSIS — Z79899 Other long term (current) drug therapy: Secondary | ICD-10-CM

## 2016-11-17 DIAGNOSIS — I83029 Varicose veins of left lower extremity with ulcer of unspecified site: Secondary | ICD-10-CM | POA: Diagnosis not present

## 2016-11-17 DIAGNOSIS — L97929 Non-pressure chronic ulcer of unspecified part of left lower leg with unspecified severity: Secondary | ICD-10-CM | POA: Diagnosis present

## 2016-11-17 DIAGNOSIS — L97919 Non-pressure chronic ulcer of unspecified part of right lower leg with unspecified severity: Secondary | ICD-10-CM

## 2016-11-17 DIAGNOSIS — I10 Essential (primary) hypertension: Secondary | ICD-10-CM | POA: Diagnosis not present

## 2016-11-17 DIAGNOSIS — Z8249 Family history of ischemic heart disease and other diseases of the circulatory system: Secondary | ICD-10-CM

## 2016-11-17 DIAGNOSIS — E1151 Type 2 diabetes mellitus with diabetic peripheral angiopathy without gangrene: Secondary | ICD-10-CM | POA: Diagnosis present

## 2016-11-17 DIAGNOSIS — E875 Hyperkalemia: Secondary | ICD-10-CM | POA: Diagnosis not present

## 2016-11-17 DIAGNOSIS — N183 Chronic kidney disease, stage 3 unspecified: Secondary | ICD-10-CM | POA: Diagnosis present

## 2016-11-17 DIAGNOSIS — I130 Hypertensive heart and chronic kidney disease with heart failure and stage 1 through stage 4 chronic kidney disease, or unspecified chronic kidney disease: Secondary | ICD-10-CM | POA: Diagnosis present

## 2016-11-17 DIAGNOSIS — Z87891 Personal history of nicotine dependence: Secondary | ICD-10-CM | POA: Diagnosis not present

## 2016-11-17 DIAGNOSIS — I5032 Chronic diastolic (congestive) heart failure: Secondary | ICD-10-CM | POA: Diagnosis not present

## 2016-11-17 DIAGNOSIS — I739 Peripheral vascular disease, unspecified: Secondary | ICD-10-CM | POA: Diagnosis not present

## 2016-11-17 DIAGNOSIS — I83019 Varicose veins of right lower extremity with ulcer of unspecified site: Secondary | ICD-10-CM

## 2016-11-17 DIAGNOSIS — I83009 Varicose veins of unspecified lower extremity with ulcer of unspecified site: Secondary | ICD-10-CM | POA: Diagnosis not present

## 2016-11-17 DIAGNOSIS — L97909 Non-pressure chronic ulcer of unspecified part of unspecified lower leg with unspecified severity: Secondary | ICD-10-CM | POA: Diagnosis present

## 2016-11-17 DIAGNOSIS — I7025 Atherosclerosis of native arteries of other extremities with ulceration: Secondary | ICD-10-CM | POA: Diagnosis not present

## 2016-11-17 DIAGNOSIS — Z6832 Body mass index (BMI) 32.0-32.9, adult: Secondary | ICD-10-CM | POA: Diagnosis not present

## 2016-11-17 DIAGNOSIS — E440 Moderate protein-calorie malnutrition: Secondary | ICD-10-CM | POA: Diagnosis not present

## 2016-11-17 DIAGNOSIS — M109 Gout, unspecified: Secondary | ICD-10-CM | POA: Diagnosis present

## 2016-11-17 DIAGNOSIS — I878 Other specified disorders of veins: Secondary | ICD-10-CM | POA: Diagnosis not present

## 2016-11-17 DIAGNOSIS — Z23 Encounter for immunization: Secondary | ICD-10-CM

## 2016-11-17 DIAGNOSIS — L97202 Non-pressure chronic ulcer of unspecified calf with fat layer exposed: Secondary | ICD-10-CM | POA: Diagnosis not present

## 2016-11-17 DIAGNOSIS — L989 Disorder of the skin and subcutaneous tissue, unspecified: Secondary | ICD-10-CM | POA: Diagnosis present

## 2016-11-17 DIAGNOSIS — I83002 Varicose veins of unspecified lower extremity with ulcer of calf: Secondary | ICD-10-CM | POA: Diagnosis not present

## 2016-11-17 HISTORY — DX: Psoriasis, unspecified: L40.9

## 2016-11-17 LAB — I-STAT CHEM 8, ED
BUN: 35 mg/dL — ABNORMAL HIGH (ref 6–20)
CREATININE: 1.4 mg/dL — AB (ref 0.61–1.24)
Calcium, Ion: 1.06 mmol/L — ABNORMAL LOW (ref 1.15–1.40)
Chloride: 99 mmol/L — ABNORMAL LOW (ref 101–111)
Glucose, Bld: 98 mg/dL (ref 65–99)
HEMATOCRIT: 43 % (ref 39.0–52.0)
HEMOGLOBIN: 14.6 g/dL (ref 13.0–17.0)
POTASSIUM: 5.7 mmol/L — AB (ref 3.5–5.1)
SODIUM: 138 mmol/L (ref 135–145)
TCO2: 32 mmol/L (ref 0–100)

## 2016-11-17 LAB — CBC WITH DIFFERENTIAL/PLATELET
BASOS ABS: 0 10*3/uL (ref 0.0–0.1)
BASOS PCT: 0 %
Eosinophils Absolute: 0.1 10*3/uL (ref 0.0–0.7)
Eosinophils Relative: 1 %
HEMATOCRIT: 38.9 % — AB (ref 39.0–52.0)
HEMOGLOBIN: 13 g/dL (ref 13.0–17.0)
LYMPHS PCT: 17 %
Lymphs Abs: 1.1 10*3/uL (ref 0.7–4.0)
MCH: 31.7 pg (ref 26.0–34.0)
MCHC: 33.4 g/dL (ref 30.0–36.0)
MCV: 94.9 fL (ref 78.0–100.0)
Monocytes Absolute: 0.8 10*3/uL (ref 0.1–1.0)
Monocytes Relative: 13 %
NEUTROS ABS: 4.6 10*3/uL (ref 1.7–7.7)
NEUTROS PCT: 69 %
Platelets: 276 10*3/uL (ref 150–400)
RBC: 4.1 MIL/uL — AB (ref 4.22–5.81)
RDW: 14.5 % (ref 11.5–15.5)
WBC: 6.6 10*3/uL (ref 4.0–10.5)

## 2016-11-17 MED ORDER — ALLOPURINOL 300 MG PO TABS
300.0000 mg | ORAL_TABLET | Freq: Every day | ORAL | Status: DC
  Administered 2016-11-18 – 2016-11-20 (×3): 300 mg via ORAL
  Filled 2016-11-17 (×3): qty 1

## 2016-11-17 MED ORDER — ONDANSETRON HCL 4 MG/2ML IJ SOLN: 4.0000 mg | Freq: Four times a day (QID) | INTRAMUSCULAR | Status: DC | PRN

## 2016-11-17 MED ORDER — SODIUM CHLORIDE 0.9 % IV SOLN: 250.0000 mL | INTRAVENOUS | Status: DC | PRN

## 2016-11-17 MED ORDER — GABAPENTIN 300 MG PO CAPS
300.0000 mg | ORAL_CAPSULE | Freq: Three times a day (TID) | ORAL | Status: DC
  Administered 2016-11-18 – 2016-11-20 (×9): 300 mg via ORAL
  Filled 2016-11-17 (×9): qty 1

## 2016-11-17 MED ORDER — HEPARIN SODIUM (PORCINE) 5000 UNIT/ML IJ SOLN
5000.0000 [IU] | Freq: Three times a day (TID) | INTRAMUSCULAR | Status: DC
  Administered 2016-11-18 – 2016-11-20 (×8): 5000 [IU] via SUBCUTANEOUS
  Filled 2016-11-17 (×8): qty 1

## 2016-11-17 MED ORDER — ACETAMINOPHEN 650 MG RE SUPP: 650.0000 mg | Freq: Four times a day (QID) | RECTAL | Status: DC | PRN

## 2016-11-17 MED ORDER — TETANUS-DIPHTH-ACELL PERTUSSIS 5-2.5-18.5 LF-MCG/0.5 IM SUSP
0.5000 mL | Freq: Once | INTRAMUSCULAR | Status: AC
  Administered 2016-11-17: 0.5 mL via INTRAMUSCULAR
  Filled 2016-11-17: qty 0.5

## 2016-11-17 MED ORDER — TRAMADOL HCL 50 MG PO TABS
100.0000 mg | ORAL_TABLET | Freq: Four times a day (QID) | ORAL | Status: DC | PRN
  Administered 2016-11-18 – 2016-11-19 (×2): 100 mg via ORAL
  Filled 2016-11-17 (×3): qty 2

## 2016-11-17 MED ORDER — SODIUM CHLORIDE 0.9% FLUSH
3.0000 mL | Freq: Two times a day (BID) | INTRAVENOUS | Status: DC
  Administered 2016-11-19: 3 mL via INTRAVENOUS

## 2016-11-17 MED ORDER — VERAPAMIL HCL 120 MG PO TABS
120.0000 mg | ORAL_TABLET | Freq: Every day | ORAL | Status: DC
  Administered 2016-11-18 – 2016-11-20 (×3): 120 mg via ORAL
  Filled 2016-11-17 (×3): qty 1

## 2016-11-17 MED ORDER — TETANUS-DIPHTH-ACELL PERTUSSIS 5-2.5-18.5 LF-MCG/0.5 IM SUSP: 0.5000 mL | Freq: Once | INTRAMUSCULAR | Status: DC

## 2016-11-17 MED ORDER — ACETAMINOPHEN 325 MG PO TABS: 650.0000 mg | ORAL_TABLET | Freq: Four times a day (QID) | ORAL | Status: DC | PRN

## 2016-11-17 MED ORDER — SODIUM CHLORIDE 0.9% FLUSH
3.0000 mL | Freq: Two times a day (BID) | INTRAVENOUS | Status: DC
  Administered 2016-11-18: 3 mL via INTRAVENOUS

## 2016-11-17 MED ORDER — CLOBETASOL PROPIONATE 0.05 % EX CREA
TOPICAL_CREAM | Freq: Two times a day (BID) | CUTANEOUS | Status: DC
  Administered 2016-11-18 – 2016-11-19 (×3): via TOPICAL
  Administered 2016-11-19: 1 via TOPICAL
  Administered 2016-11-20: 11:00:00 via TOPICAL
  Filled 2016-11-17 (×2): qty 15

## 2016-11-17 MED ORDER — FUROSEMIDE 20 MG PO TABS
20.0000 mg | ORAL_TABLET | Freq: Three times a day (TID) | ORAL | Status: DC
  Administered 2016-11-18 – 2016-11-20 (×9): 20 mg via ORAL
  Filled 2016-11-17 (×9): qty 1

## 2016-11-17 MED ORDER — SODIUM CHLORIDE 0.9% FLUSH: 3.0000 mL | INTRAVENOUS | Status: DC | PRN

## 2016-11-17 MED ORDER — ONDANSETRON HCL 4 MG PO TABS: 4.0000 mg | ORAL_TABLET | Freq: Four times a day (QID) | ORAL | Status: DC | PRN

## 2016-11-17 NOTE — ED Notes (Signed)
Pt st's his bil legs have looked like this for approx 3 months.  St's they are very slow to heal.  Pt st's his legs were last dressed on Saturday.  Foul odor coming from legs.

## 2016-11-17 NOTE — Consult Note (Addendum)
Referring Physician: Dr [name] [name]  Patient name: [name] [name] MRN: [id number] DOB: 10-22-1926 Sex: male  REASON FOR CONSULT: bilateral leg wounds  HPI: [name] [name] is a 83 y.o. male previously seen by my partner Dr [name] [name] about 18 months ago.  He has long term venous stasis ulcers mixed with severe peripheral arterial disease.  He was thought at that time not to be a revasc candidate due to overall debility and dementia.  He apparently lives alone and has a home health nurse that changes his unna boots.  He came to ER today because of increased drainage.  He denies foot pain. Other medical problems include diabetes per history although on no meds.  He also has hypertension and psoriasis and neuropathy.  Past Medical History:  Diagnosis Date  . Chronic kidney disease    RENAL INSUFICCIENCY  . Diabetes mellitus without complication ([place])   . DVT (deep venous thrombosis) ([place])   . Gout   . Hypertension   . Lymphadenitis 08/10/2012  . Psoriasis    History reviewed. No pertinent surgical history.  Family History  Problem Relation Age of Onset  . Hypertension Mother   . Stroke Mother   . Hypertension Father     SOCIAL HISTORY: Social History   Social History  . Marital status: Widowed    Spouse name: N/A  . Number of children: N/A  . Years of education: N/A   Occupational History  . Not on file.   Social History Main Topics  . Smoking status: Former Smoker    Years: 40.00    Types: Pipe  . Smokeless tobacco: Never Used  . Alcohol use No  . Drug use: No  . Sexual activity: Not on file   Other Topics Concern  . Not on file   Social History Narrative  . No narrative on file    No Known Allergies  No current facility-administered medications for this encounter.    Current Outpatient Prescriptions  Medication Sig Dispense Refill  . allopurinol (ZYLOPRIM) 300 MG tablet Take 300 mg by mouth daily.    . furosemide (LASIX) 20 MG tablet Take 20 mg by mouth 3 (three)  times daily.    Marland Kitchen gabapentin (NEURONTIN) 100 MG capsule Take 3 capsules (300 mg total) by mouth 3 (three) times daily. 270 capsule 0  . losartan (COZAAR) 50 MG tablet Take 50 mg by mouth daily.    . traMADol (ULTRAM) 50 MG tablet Take 100 mg by mouth 2 (two) times daily.    Marland Kitchen triamcinolone ointment (KENALOG) 0.1 % Apply 1 application topically 2 (two) times daily.    . verapamil (VERELAN) 100 MG 24 hr capsule Take 100 mg by mouth daily.     . Vitamin D, Ergocalciferol, (DRISDOL) 50000 units CAPS capsule Take 50,000 Units by mouth every 7 (seven) days.      ROS:   General:  No weight loss, Fever, chills  HEENT: No recent headaches, no nasal bleeding, no visual changes, no sore throat  Neurologic: No dizziness, blackouts, seizures. No recent symptoms of stroke or mini- stroke. No recent episodes of slurred speech, or temporary blindness.  Cardiac: No recent episodes of chest pain/pressure, no shortness of breath at rest.  + shortness of breath with exertion.  Denies history of atrial fibrillation or irregular heartbeat  Vascular: No history of rest pain in feet.  No history of claudication.  + history of non-healing ulcer  Pulmonary: No home oxygen, no productive cough, no hemoptysis,  No  asthma or wheezing  Musculoskeletal:  [X]  Arthritis, [ ]  Low back pain,  [X]  Joint pain  Hematologic:No history of hypercoagulable state.  No history of easy bleeding.  No history of anemia  Gastrointestinal: No hematochezia or melena,  No gastroesophageal reflux, no trouble swallowing  Urinary: [X]  chronic Kidney disease, [ ]  on HD - [ ]  MWF or [ ]  TTHS, [ ]  Burning with urination, [ ]  Frequent urination, [ ]  Difficulty urinating;   Skin: No rashes  Psychological: No history of anxiety,  No history of depression   Physical Examination  Vitals:   11/17/16 1504  BP: 137/75  Pulse: 75  Resp: 17  Temp: (!) 97.5 F (36.4 C)  TempSrc: Oral  SpO2: 95%  Weight: 220 lb (99.8 kg)  Height: 5\' 10"   (1.778 m)    Body mass index is 31.57 kg/m.  General:  Alert and oriented, no acute distress but somewhat confused on details of his history HEENT: Normal Neck: No bruit or JVD Pulmonary: Clear to auscultation bilaterally Cardiac: Regular Rate and Rhythm  Abdomen: Soft, non-tender, non-distended, obese Skin: thickened skin below knee area both legs with multiple superficial ulcerations up to 7 cm length, no wounds on feet. Extremity Pulses:  2+ femoral, monophasic left dorsalis pedis signal, absent doppler posterior tibial bilaterally, brisk peroneal doppler bilaterally Musculoskeletal: 2+ edema with continuous serous drainage from wounds Neurologic: Upper and lower extremity motor 5/5 and symmetric  DATA:  CBC    Component Value Date/Time   WBC 7.3 10/19/2014 0646   RBC 3.77 (L) 10/19/2014 0646   HGB 14.6 11/17/2016 1940   HCT 43.0 11/17/2016 1940   PLT 227 10/19/2014 0646   MCV 95.8 10/19/2014 0646   MCH 32.1 10/19/2014 0646   MCHC 33.5 10/19/2014 0646   RDW 14.4 10/19/2014 0646   LYMPHSABS 0.9 10/19/2014 0646   MONOABS 1.1 (H) 10/19/2014 0646   EOSABS 0.0 10/19/2014 0646   BASOSABS 0.0 10/19/2014 0646    BMET    Component Value Date/Time   NA 138 11/17/2016 1940   K 5.7 (H) 11/17/2016 1940   CL 99 (L) 11/17/2016 1940   CO2 27 10/22/2014 0354   GLUCOSE 98 11/17/2016 1940   BUN 35 (H) 11/17/2016 1940   CREATININE 1.40 (H) 11/17/2016 1940   CALCIUM 8.5 (L) 10/22/2014 0354   GFRNONAA 44 (L) 10/22/2014 0354   GFRAA 50 (L) 10/22/2014 0354    ASSESSMENT:  Chronic venous stasis wounds probably now going on about 2 years.  Discussed with pt options of continued wound care with compression versus bilateral amputations of legs.  I am doubtful these wounds will ever heal but currently pt is not septic and he states he would prefer to continue wound care for now.   PLAN:  1.  Pt needs bilateral compression dressings of leg.  May need hospitalist admission with leg  elevation and nutritional support for a few days to let his legs improve a little for outpt  2.  Have discussed with ER to try to locate daughter to involve her in decision making process   [name] Hinds, MD [facility] Office: [phone] Pager: [phone]  Addendum: spoke with daughter she will talk with her father about options of wound care vs. Amputation tomorrow.  [name] Hinds, MD [facility] Office: [phone] Pager: [phone]

## 2016-11-17 NOTE — H&P (Signed)
History and Physical    [name] [name] DOB: 10-01-1926 DOA: 11/17/2016  PCP: Alvester Chou, NP   Patient coming from: Home  Chief Complaint: Leg wounds with increased drainage  HPI: [name] [name] is a 83 y.o. male with medical history significant for chronic diastolic CHF, chronic kidney disease stage III, hypertension, chronic venous stasis, and peripheral arterial disease, now presenting to the emergency department for evaluation of increased pain, swelling, and weeping from the bilateral lower legs. Patient has a long history of chronic venous stasis with ulcers, as well as peripheral arterial disease evaluated previously by vascular surgery and felt to be a poor candidate for revascularization. He has been managed at home with compressive wraps. He reports increasing pain, swelling, and oozing of yellow fluid from bilateral legs, worsening over the last week or so. Denies any fevers or chills, denies chest pain or palpitations, and denies any significant orthopnea.  ED Course: Upon arrival to the ED, patient is found to be afebrile, saturating well on room air, and with vital signs stable. EKG features sinus rhythm with low voltage QRS. Chemistry panels notable for a potassium of 5.7, BUN of 35, and serum creatinine 1.40 which appears consistent with his baseline. CBC is unremarkable. Vascular surgery was consulted by the ED physician, evaluated the patient in the emergency department, and requests a medical admission for ongoing management of worsening leg swelling and wounds secondary to PAD and chronic venous stasis, with consideration for possible bilateral amputations. Patient had his Tdap updated in the ED, remained hemodynamically stable, has not been in any apparent respiratory distress, and given the hyperkalemia, will be admitted to the telemetry unit for ongoing evaluation and management of PAD and chronic venous stasis with increasing swelling, pain, and drainage.  Review of  Systems:  All other systems reviewed and apart from HPI, are negative.  Past Medical History:  Diagnosis Date  . Chronic kidney disease    RENAL INSUFICCIENCY  . Diabetes mellitus without complication ([place])   . DVT (deep venous thrombosis) ([place])   . Gout   . Hypertension   . Lymphadenitis 08/10/2012  . Psoriasis     History reviewed. No pertinent surgical history.   reports that he has quit smoking. His smoking use included Pipe. He quit after 40.00 years of use. He has never used smokeless tobacco. He reports that he does not drink alcohol or use drugs.  No Known Allergies  Family History  Problem Relation Age of Onset  . Hypertension Mother   . Stroke Mother   . Hypertension Father      Prior to Admission medications   Medication Sig Start Date End Date Taking? Authorizing Provider  allopurinol (ZYLOPRIM) 300 MG tablet Take 300 mg by mouth daily.   Yes [provider]  furosemide (LASIX) 20 MG tablet Take 20 mg by mouth 3 (three) times daily.   Yes [provider]  gabapentin (NEURONTIN) 100 MG capsule Take 3 capsules (300 mg total) by mouth 3 (three) times daily. 09/07/16 11/17/16 Yes Maryan Puls, MD  losartan (COZAAR) 50 MG tablet Take 50 mg by mouth daily.   Yes [provider]  traMADol (ULTRAM) 50 MG tablet Take 100 mg by mouth 2 (two) times daily. 11/17/16  Yes [provider]  triamcinolone ointment (KENALOG) 0.1 % Apply 1 application topically 2 (two) times daily.   Yes [provider]  verapamil (VERELAN) 100 MG 24 hr capsule Take 100 mg by mouth daily.    Yes [provider]  Vitamin D, Ergocalciferol, (DRISDOL) 50000 units CAPS capsule Take 50,000 Units by mouth every 7 (seven) days.   Yes [provider]    Physical Exam: Vitals:   11/17/16 1945 11/17/16 2000 11/17/16 2015 11/17/16 2030  BP: (!) 136/96   131/75  Pulse: 85 83 81   Resp: (!) 35 18 16 (!) 35  Temp:      TempSrc:      SpO2: 100%  100% 100%   Weight:      Height:          Constitutional: No acute distress, chronically-ill appearing.  Eyes: PERTLA, lids and conjunctivae normal ENMT: Mucous membranes are moist. Posterior pharynx clear of any exudate or lesions.   Neck: normal, supple, no masses, no thyromegaly Respiratory: clear to auscultation bilaterally, no wheezing, no crackles. Normal respiratory effort.  Cardiovascular: S1 & S2 heard, regular rate and rhythm. No significant JVD. Abdomen: No distension, no tenderness, no masses palpated. Bowel sounds normal.  Musculoskeletal: no clubbing / cyanosis. No joint deformity upper and lower extremities.   Skin: Scaly white plaques at scalp and UE's with fissures and bleeding. Ulcerations and serous oozing to bilateral lower legs Neurologic: CN 2-12 grossly intact. Sensation intact, DTR normal. Strength 5/5 in all 4 limbs.  Psychiatric: Alert and oriented x 3. Pleasant and cooperative.     Labs on Admission: I have personally reviewed following labs and imaging studies  CBC:  Recent Labs Lab 11/17/16 1940 11/17/16 2028  WBC  --  6.6  NEUTROABS  --  4.6  HGB 14.6 13.0  HCT 43.0 38.9*  MCV  --  94.9  PLT  --  563   Basic Metabolic Panel:  Recent Labs Lab 11/17/16 1940  NA 138  K 5.7*  CL 99*  GLUCOSE 98  BUN 35*  CREATININE 1.40*   GFR: Estimated Creatinine Clearance: 43.2 mL/min (A) (by C-G formula based on SCr of 1.4 mg/dL (H)). Liver Function Tests: No results for input(s): AST, ALT, ALKPHOS, BILITOT, PROT, ALBUMIN in the last 168 hours. No results for input(s): LIPASE, AMYLASE in the last 168 hours. No results for input(s): AMMONIA in the last 168 hours. Coagulation Profile: No results for input(s): INR, PROTIME in the last 168 hours. Cardiac Enzymes: No results for input(s): CKTOTAL, CKMB, CKMBINDEX, TROPONINI in the last 168 hours. BNP (last 3 results) No results for input(s): PROBNP in the last 8760 hours. HbA1C: No results for  input(s): HGBA1C in the last 72 hours. CBG: No results for input(s): GLUCAP in the last 168 hours. Lipid Profile: No results for input(s): CHOL, HDL, LDLCALC, TRIG, CHOLHDL, LDLDIRECT in the last 72 hours. Thyroid Function Tests: No results for input(s): TSH, T4TOTAL, FREET4, T3FREE, THYROIDAB in the last 72 hours. Anemia Panel: No results for input(s): VITAMINB12, FOLATE, FERRITIN, TIBC, IRON, RETICCTPCT in the last 72 hours. Urine analysis:    Component Value Date/Time   COLORURINE YELLOW 11/16/2012 0345   APPEARANCEUR CLEAR 11/16/2012 0345   LABSPEC 1.018 11/16/2012 0345   PHURINE 5.0 11/16/2012 0345   GLUCOSEU NEGATIVE 11/16/2012 0345   HGBUR NEGATIVE 11/16/2012 0345   BILIRUBINUR NEGATIVE 11/16/2012 0345   KETONESUR 15 (A) 11/16/2012 0345   PROTEINUR NEGATIVE 11/16/2012 0345   UROBILINOGEN 0.2 11/16/2012 0345   NITRITE NEGATIVE 11/16/2012 0345   LEUKOCYTESUR NEGATIVE 11/16/2012 0345   Sepsis Labs: @LABRCNTIP (procalcitonin:4,lacticidven:4) )No results found for this or any previous visit (from the past 240 hour(s)).   Radiological Exams on Admission: No results found.  EKG: Independently reviewed. Sinus rhythm, low-voltage QRS.   Assessment/Plan  1. Venous stasis ulcers  - Pt presents with increased pain and drainage from his chronic bilateral lower leg ulcerations  - No signs of secondary infection noted  - Vascular surgery is consulting and much appreciated, offered pt bilateral amputations as significant improvement is doubtful   - Plan to elevate legs, request wound care   2. PAD - Has severe PAD, but no acute ischemia appreciated  - Has been evaluated by vascular surgery previously and felt not to be revascularization candidate  - Vascular surgery is consulting this admission and much appreciated, offered pt bilateral amputations; pt is thinking this over and will discuss with family tonight    3. CKD stage III - SCr is 1.40 on admission, consistent with  apparent baseline   4. Chronic diastolic CHF  - Preserved EF and grade 2 diastolic dysfunction on TTE from 2014  - Continue Lasix 20 mg TID, hold losartan in light of hyperkalemia    5. Hyperkalemia  - Serum potassium was 5.7 on admission without EKG change  - Plan to monitor on telemetry, hold losartan, continue Lasix, and repeat chemistries until resolved   6. Hypertension - BP is at goal in ED - Continue Lasix and verapamil  - Hold losartan in light of hyperkalemia as above    DVT prophylaxis: sq heparin Code Status: Full  Family Communication: Discussed with patient Disposition Plan: Admit to telemetry Consults called: Vascular surgery  Admission status: Inpatient    Vianne Bulls, MD [facility] Pager [phone]  If 7PM-7AM, please contact night-coverage [email or web address] Password [facility]  11/17/2016, 9:23 PM

## 2016-11-17 NOTE — ED Notes (Signed)
Dressings removed from bil legs.  Dr. Winfred Leeds unable to locate pedal pulses with doppler.

## 2016-11-17 NOTE — ED Provider Notes (Addendum)
[facility] DEPT Provider Note   CSN: [id number] Arrival date & time: 11/17/16  1346     History   Chief Complaint Chief Complaint  Patient presents with  . Skin Problem  Patient is extremely vague historian. History is obtained from patient, from patient's daughter who accompanies him and from [place], [facility] home health nurse who is very with patient's care  HPI [name] [name] is a 83 y.o. male. Patient has chronic wounds on both legs which has been present for several years. He gets wound care twice per week from home health nurse. He presents today as wounds began to drain 2 or 3 days ago. He denies pain anywhere and has been ambulatory. No fever. He does not feel ill. No other associated symptoms  HPI  Past Medical History:  Diagnosis Date  . Chronic kidney disease    RENAL INSUFICCIENCY  . Diabetes mellitus without complication ([place])   . DVT (deep venous thrombosis) ([place])   . Gout   . Hypertension   . Lymphadenitis 08/10/2012  . Psoriasis     Patient Active Problem List   Diagnosis Date Noted  . Venous stasis ulcer of lower extremity ([place]) 05/23/2015  . Cellulitis of both lower extremities 10/18/2014  . CKD (chronic kidney disease) stage 3, GFR 30-59 ml/min 10/18/2014  . Hypertension 10/18/2014  . Diastolic dysfunction 63/04/6008  . Lower extremity cellulitis 10/18/2014  . PVD (peripheral vascular disease) ([place]) 02/02/2014  . Atherosclerosis of native arteries of the extremities with ulceration ([place]) 07/22/2013  . Acute renal failure ([place]) 11/15/2012  . Fall 11/15/2012  . Diabetic ulcer of left foot ([place]) 10/15/2012  . Diabetes mellitus ([place]) 10/15/2012  . Lymphadenitis 08/07/2012  . Cellulitis 08/07/2012  . HTN (hypertension) 08/05/2012  . Renal insufficiency 08/05/2012  . Tobacco abuse 08/05/2012  . Gout 08/05/2012  . Hyperglycemia 08/05/2012    History reviewed. No pertinent surgical history.     Home Medications    Prior to Admission  medications   Medication Sig Start Date End Date Taking? Authorizing Provider  allopurinol (ZYLOPRIM) 300 MG tablet Take 300 mg by mouth daily.    [provider]  cephALEXin (KEFLEX) 500 MG capsule Take 1 capsule (500 mg total) by mouth 3 (three) times daily. Patient not taking: Reported on 05/23/2015 10/22/14   Verlee Monte, MD  doxycycline (VIBRA-TABS) 100 MG tablet Take 1 tablet (100 mg total) by mouth 2 (two) times daily. Patient not taking: Reported on 05/23/2015 10/22/14   Verlee Monte, MD  furosemide (LASIX) 20 MG tablet Take 20 mg by mouth 3 (three) times daily.    [provider]  furosemide (LASIX) 40 MG tablet Take 40 mg by mouth daily.    [provider]  gabapentin (NEURONTIN) 100 MG capsule Take 3 capsules (300 mg total) by mouth 3 (three) times daily. 09/07/16 10/07/16  Maryan Puls, MD  losartan (COZAAR) 25 MG tablet Take 25 mg by mouth daily.    [provider]  losartan (COZAAR) 50 MG tablet Take 50 mg by mouth daily.    [provider]  triamcinolone ointment (KENALOG) 0.1 % Apply 1 application topically 2 (two) times daily.    [provider]  verapamil (CALAN-SR) 240 MG CR tablet Take 240 mg by mouth daily.     [provider]  verapamil (VERELAN) 100 MG 24 hr capsule Take 100 mg by mouth 2 (two) times daily.    [provider]  Vitamin D, Ergocalciferol, (DRISDOL) 50000 units CAPS capsule Take  50,000 Units by mouth every 7 (seven) days.    [provider]    Family History Family History  Problem Relation Age of Onset  . Hypertension Mother   . Stroke Mother   . Hypertension Father     Social History Social History  Substance Use Topics  . Smoking status: Former Smoker    Years: 40.00    Types: Pipe  . Smokeless tobacco: Never Used  . Alcohol use No     Allergies   Patient has no known allergies.   Review of Systems Review of Systems  Constitutional: Negative.   HENT:  Negative.   Respiratory: Negative.   Cardiovascular: Negative.   Gastrointestinal: Negative.   Musculoskeletal: Positive for gait problem.       Walks with walker  Skin: Positive for wound.       Chronic wounds bilateral legs  Allergic/Immunologic: Positive for immunocompromised state.       Diabetic  Psychiatric/Behavioral: Negative.      Physical Exam Updated Vital Signs BP 137/75 (BP Location: Left Arm)   Pulse 75   Temp (!) 97.5 F (36.4 C) (Oral)   Resp 17   Ht 5\' 10"  (1.778 m)   Wt 99.8 kg (220 lb)   SpO2 95%   BMI 31.57 kg/m   Physical Exam  Constitutional:  Chronically ill-appearing  HENT:  Head: Normocephalic and atraumatic.  Eyes: Pupils are equal, round, and reactive to light. Conjunctivae are normal.  Neck: Neck supple. No tracheal deviation present. No thyromegaly present.  Cardiovascular: Normal rate and regular rhythm.   No murmur heard. Pulmonary/Chest: Effort normal and breath sounds normal.  Abdominal: Soft. Bowel sounds are normal. He exhibits no distension. There is no tenderness.  Musculoskeletal: He exhibits no edema or tenderness.  Bilateral lower extremities with chronic appearing clean wounds at lateral aspects of Legs. Bilateral lower extremities with chronic appearing brawny changes. Legs are nontender. DP and PT pulses absent bilaterally. Popliteal pulses absent bilaterally. Femoral pulses 1+ left side and 2+ right side.  Neurological: He is alert. Coordination normal.  Skin: Skin is warm and dry. No rash noted.  Psychiatric: He has a normal mood and affect.  Nursing note and vitals reviewed.    ED Treatments / Results  Labs (all labs ordered are listed, but only abnormal results are displayed) Labs Reviewed - No data to display  EKG  EKG Interpretation  Date/Time:  Monday November 17 2016 18:22:23 EDT Ventricular Rate:  80 PR Interval:    QRS Duration: 96 QT Interval:  423 QTC Calculation: 488 R Axis:   8 Text Interpretation:   Sinus rhythm Low voltage, precordial leads Consider anterior infarct No significant change since last tracing Confirmed by Orlie Dakin [phone]) on 11/17/2016 7:08:35 PM      Results for orders placed or performed during the hospital encounter of 11/17/16  CBC with Differential/Platelet  Result Value Ref Range   WBC 6.6 4.0 - 10.5 K/uL   RBC 4.10 (L) 4.22 - 5.81 MIL/uL   Hemoglobin 13.0 13.0 - 17.0 g/dL   HCT 38.9 (L) 39.0 - 52.0 %   MCV 94.9 78.0 - 100.0 fL   MCH 31.7 26.0 - 34.0 pg   MCHC 33.4 30.0 - 36.0 g/dL   RDW 14.5 11.5 - 15.5 %   Platelets 276 150 - 400 K/uL   Neutrophils Relative % 69 %   Neutro Abs 4.6 1.7 - 7.7 K/uL   Lymphocytes Relative 17 %   Lymphs Abs  1.1 0.7 - 4.0 K/uL   Monocytes Relative 13 %   Monocytes Absolute 0.8 0.1 - 1.0 K/uL   Eosinophils Relative 1 %   Eosinophils Absolute 0.1 0.0 - 0.7 K/uL   Basophils Relative 0 %   Basophils Absolute 0.0 0.0 - 0.1 K/uL  I-stat chem 8, ed  Result Value Ref Range   Sodium 138 135 - 145 mmol/L   Potassium 5.7 (H) 3.5 - 5.1 mmol/L   Chloride 99 (L) 101 - 111 mmol/L   BUN 35 (H) 6 - 20 mg/dL   Creatinine, Ser 1.40 (H) 0.61 - 1.24 mg/dL   Glucose, Bld 98 65 - 99 mg/dL   Calcium, Ion 1.06 (L) 1.15 - 1.40 mmol/L   TCO2 32 0 - 100 mmol/L   Hemoglobin 14.6 13.0 - 17.0 g/dL   HCT 43.0 39.0 - 52.0 %   No results found.  Radiology No results found.  Procedures Procedures (including critical care time)  Medications Ordered in ED Medications  Tdap (BOOSTRIX) injection 0.5 mL (not administered)    I consulted Dr. Oneida Alar from vascular surgery who will evaluate patient in ED. I suspect the patient has poor circulation which is chronic. He seems to have poor follow-up seeing only a physician occasionally who his daughter thinks is a GP cannot recall the name. He also sees home health nurse Initial Impression / Assessment and Plan / ED Course  I have reviewed the triage vital signs and the nursing notes.  Pertinent  labs & imaging results that were available during my care of the patient were reviewed by me and considered in my medical decision making (see chart for details).     Dr. Oneida Alar from vascular surgery was consulted and evaluated patient in ED. He requests admission to hospitalist service for leg elevation. I consulted Dr. Myna Hidalgo who will arrange for overnight stay Patient has mild hyperkalemia on i-STAT 8 which does not require treatment. Renal insufficiency is chronic Final Clinical Impressions(s) / ED Diagnoses  Diagnosis #1 chronic Skin lesion of legs #2 mild hyperkalemia #3 chronic renal insufficiency Final diagnoses:  None    New Prescriptions New Prescriptions   No medications on file     Orlie Dakin, MD 11/17/16 2133    Orlie Dakin, MD 11/17/16 2135

## 2016-11-17 NOTE — ED Notes (Signed)
Admitting MD at bedside at this time.

## 2016-11-17 NOTE — ED Notes (Signed)
Isabella Stalling (Pt's daughter) [phone]

## 2016-11-17 NOTE — ED Notes (Signed)
Gave pt a urinal 

## 2016-11-17 NOTE — ED Notes (Signed)
Dr. Sola Margolis in to assess pt at this time 

## 2016-11-17 NOTE — ED Notes (Signed)
Pt moved from waiting room to [place]. Undressed and assisted to bed. Bottom of pts pants soaked from leaking legs. Shoes soaked.

## 2016-11-17 NOTE — ED Triage Notes (Signed)
Pt presents with c/o leg swelling and drainage. He is currently under treatment for lower extremity edema and sores with routine visits from home health nurse for dressing changes. This week hes noticed increased pain and  drainage from his legs with yellow fluid oozing from his dressings.

## 2016-11-18 ENCOUNTER — Encounter: Payer: Self-pay | Admitting: *Deleted

## 2016-11-18 LAB — CBC WITH DIFFERENTIAL/PLATELET
BASOS ABS: 0 10*3/uL (ref 0.0–0.1)
Basophils Relative: 0 %
EOS PCT: 1 %
Eosinophils Absolute: 0.1 10*3/uL (ref 0.0–0.7)
HEMATOCRIT: 34.7 % — AB (ref 39.0–52.0)
Hemoglobin: 11.4 g/dL — ABNORMAL LOW (ref 13.0–17.0)
LYMPHS ABS: 1 10*3/uL (ref 0.7–4.0)
LYMPHS PCT: 15 %
MCH: 30.8 pg (ref 26.0–34.0)
MCHC: 32.9 g/dL (ref 30.0–36.0)
MCV: 93.8 fL (ref 78.0–100.0)
MONO ABS: 0.7 10*3/uL (ref 0.1–1.0)
MONOS PCT: 10 %
NEUTROS ABS: 5 10*3/uL (ref 1.7–7.7)
Neutrophils Relative %: 74 %
Platelets: 272 10*3/uL (ref 150–400)
RBC: 3.7 MIL/uL — ABNORMAL LOW (ref 4.22–5.81)
RDW: 14.3 % (ref 11.5–15.5)
WBC: 6.8 10*3/uL (ref 4.0–10.5)

## 2016-11-18 LAB — BASIC METABOLIC PANEL
ANION GAP: 7 (ref 5–15)
BUN: 21 mg/dL — AB (ref 6–20)
CALCIUM: 8.7 mg/dL — AB (ref 8.9–10.3)
CO2: 27 mmol/L (ref 22–32)
Chloride: 101 mmol/L (ref 101–111)
Creatinine, Ser: 1.54 mg/dL — ABNORMAL HIGH (ref 0.61–1.24)
GFR calc Af Amer: 45 mL/min — ABNORMAL LOW (ref >60)
GFR calc non Af Amer: 39 mL/min — ABNORMAL LOW (ref >60)
GLUCOSE: 136 mg/dL — AB (ref 65–99)
Potassium: 4 mmol/L (ref 3.5–5.1)
Sodium: 135 mmol/L (ref 135–145)

## 2016-11-18 LAB — MRSA PCR SCREENING: MRSA by PCR: NEGATIVE

## 2016-11-18 MED ORDER — HYDROCODONE-ACETAMINOPHEN 5-325 MG PO TABS
1.0000 | ORAL_TABLET | Freq: Once | ORAL | Status: AC
Start: 2016-11-18 — End: 2016-11-18
  Administered 2016-11-18: 1 via ORAL
  Filled 2016-11-18: qty 1

## 2016-11-18 NOTE — Consult Note (Signed)
[facility] Nurse wound consult note Reason for Consult: Bilateral venous leg ulcerations, chronic, non-healing.  Note from Dr. Oneida Alar (VVS) appreciated and we will follow his POC for bilateral wrap application by Ortho Tech.. Wound type: Venous insufficiency with some degree of PAD (according to VVS note) Pressure Injury POA: No Measurement:Right lateral LE:  7cm x 5cm x 0.2cm area of full thickness tissue loss, red, moist with small to moderate amount of serous to serosanguinous exudate. Left lateral LE:  6cm x 4cm x 0.2cm full thickness wound with red moist wound bed, small to moderate amount of serous exudate.  Old dressing removed from this wound for assessment.  LEs with ZO residue from [place]' Boot removal 4 days ago according to patient.   Wound bed:As described above Drainage (amount, consistency, odor) As described above Periwound: Mild edema (patient has had LEs elevated since admission), no warmth, induration or erythema noted. Dressing procedure/placement/frequency: I will today ask Nursing to wash and dry the LEs to remove the residual zinc oxide and to follow with topical wound care using a silver hydrofiber over the lateral LE wounds.  This will be held in place with paper tape until Ortho Tech arrives for placement of bilateral Unna's Boots wrapped from toe to knee (heel inclusive). These will be changed twice weekly while in house. Patient was followed by [facility] prior to admission and it is recommended that those services be continued upon discharge if patient is to return to home.  If you agree, please order/arrange for Case Management. [place] nursing team will not follow, but will remain available to this patient, the nursing and medical teams.  Please re-consult if needed. Thanks, Maudie Flakes, MSN, RN, [place], Arther Abbott  Pager# [phone]

## 2016-11-18 NOTE — H&P (Signed)
PROGRESS NOTE    [name] [name]  HBZ:[id number] DOB: Oct 04, 1926 DOA: 11/17/2016 PCP: Alvester Chou, NP     Brief Narrative:   [name] [name] is a 83 y.o. male with medical history significant for chronic diastolic CHF, chronic kidney disease stage III, hypertension, chronic venous stasis, and peripheral arterial disease, now presenting to the emergency department for evaluation of increased pain, swelling, and weeping from the bilateral lower legs .     Assessment & Plan:    1. Venous stasis ulcers : sever , worsening . - Pt presents with increased pain and drainage from his chronic bilateral lower leg ulcerations . - Vascular surgery  offered pt bilateral amputations , pending discussion with the family .    2. PAD - Has severe PAD, but no acute ischemia appreciated  - Has been evaluated by vascular surgery previously and felt not to be revascularization candidate    3. CKD stage III - SCr is 1.40 on admission, consistent with apparent baseline   4. Chronic diastolic CHF  - Preserved EF and grade 2 diastolic dysfunction on TTE from 2014  - Continue Lasix 20 mg TID, hold losartan in light of hyperkalemia    5. Hyperkalemia  - Serum potassium was 5.7 on admission without EKG change . -resolved .  6. Hypertension - controlled , lasix and verapamil added.      DVT prophylaxis: Heparin Code Status: (Full)   Consultants:   Vascular   Procedures:    Antimicrobials: (none )   Subjective:  B/L lower ext pain and swelling , oozing wounds , no fevers or chills , no nausea or vomiting .  Objective: Vitals:   11/17/16 2145 11/17/16 2309 11/18/16 0455 11/18/16 0914  BP: 125/76 (!) 152/63 (!) 104/54 102/71  Pulse: 73 84 76 78  Resp: 17 17 14 16   Temp:  98.4 F (36.9 C) 99.5 F (37.5 C) 98.7 F (37.1 C)  TempSrc:    Oral  SpO2: 100% 98% 96% 97%  Weight:  102.5 kg (226 lb)    Height:  5\' 10"  (1.778 m)      Intake/Output Summary (Last 24 hours) at 11/18/16  1238 Last data filed at 11/18/16 1000  Gross per 24 hour  Intake              240 ml  Output              225 ml  Net               15 ml   Filed Weights   11/17/16 1504 11/17/16 2309  Weight: 99.8 kg (220 lb) 102.5 kg (226 lb)    Examination:  General exam: Appears calm and comfortable  Respiratory system: Clear to auscultation. Respiratory effort normal. Cardiovascular system: S1 & S2 heard, RRR. No JVD, murmurs, rubs, gallops or clicks. No pedal edema. Gastrointestinal system: Abdomen is nondistended, soft and nontender. No organomegaly or masses felt. Normal bowel sounds heard. Central nervous system: Alert and oriented. No focal neurological deficits. Extremities: +2 edema with wounds oozing bloody serous fluids. Skin: No rashes, lesions or ulcers Psychiatry: Judgement and insight appear normal. Mood & affect appropriate.     Data Reviewed: I have personally reviewed following labs and imaging studies  CBC:  Recent Labs Lab 11/17/16 1940 11/17/16 2028 11/18/16 0457  WBC  --  6.6 6.8  NEUTROABS  --  4.6 5.0  HGB 14.6 13.0 11.4*  HCT 43.0 38.9* 34.7*  MCV  --  94.9  93.8  PLT  --  276 371   Basic Metabolic Panel:  Recent Labs Lab 11/17/16 1940 11/18/16 0457  NA 138 135  K 5.7* 4.0  CL 99* 101  CO2  --  27  GLUCOSE 98 136*  BUN 35* 21*  CREATININE 1.40* 1.54*  CALCIUM  --  8.7*   GFR: Estimated Creatinine Clearance: 39.8 mL/min (A) (by C-G formula based on SCr of 1.54 mg/dL (H)). Liver Function Tests: No results for input(s): AST, ALT, ALKPHOS, BILITOT, PROT, ALBUMIN in the last 168 hours. No results for input(s): LIPASE, AMYLASE in the last 168 hours. No results for input(s): AMMONIA in the last 168 hours. Coagulation Profile: No results for input(s): INR, PROTIME in the last 168 hours. Cardiac Enzymes: No results for input(s): CKTOTAL, CKMB, CKMBINDEX, TROPONINI in the last 168 hours. BNP (last 3 results) No results for input(s): PROBNP in the  last 8760 hours. HbA1C: No results for input(s): HGBA1C in the last 72 hours. CBG: No results for input(s): GLUCAP in the last 168 hours. Lipid Profile: No results for input(s): CHOL, HDL, LDLCALC, TRIG, CHOLHDL, LDLDIRECT in the last 72 hours. Thyroid Function Tests: No results for input(s): TSH, T4TOTAL, FREET4, T3FREE, THYROIDAB in the last 72 hours. Anemia Panel: No results for input(s): VITAMINB12, FOLATE, FERRITIN, TIBC, IRON, RETICCTPCT in the last 72 hours. Urine analysis:    Component Value Date/Time   COLORURINE YELLOW 11/16/2012 0345   APPEARANCEUR CLEAR 11/16/2012 0345   LABSPEC 1.018 11/16/2012 0345   PHURINE 5.0 11/16/2012 0345   GLUCOSEU NEGATIVE 11/16/2012 0345   HGBUR NEGATIVE 11/16/2012 0345   BILIRUBINUR NEGATIVE 11/16/2012 0345   KETONESUR 15 (A) 11/16/2012 0345   PROTEINUR NEGATIVE 11/16/2012 0345   UROBILINOGEN 0.2 11/16/2012 0345   NITRITE NEGATIVE 11/16/2012 0345   LEUKOCYTESUR NEGATIVE 11/16/2012 0345   Sepsis Labs: @LABRCNTIP (procalcitonin:4,lacticidven:4)  )No results found for this or any previous visit (from the past 240 hour(s)).       Radiology Studies: No results found.      Scheduled Meds: . allopurinol  300 mg Oral Daily  . clobetasol cream   Topical BID  . furosemide  20 mg Oral TID  . gabapentin  300 mg Oral TID  . heparin  5,000 Units Subcutaneous Q8H  . sodium chloride flush  3 mL Intravenous Q12H  . sodium chloride flush  3 mL Intravenous Q12H  . verapamil  120 mg Oral Daily   Continuous Infusions: . sodium chloride       LOS: 1 day    Time spent: 108minutes    Waldron Session, MD [facility] Pager [phone]  If 7PM-7AM, please contact night-coverage [email or web address] Password [facility] 11/18/2016, 12:38 PM

## 2016-11-18 NOTE — Treatment Plan (Signed)
[name] Dutch, MD sent to [facility] Charge Pool        Level 4 consult for bilateral stasis ulcers   Can follow up with Dr Bridgett Larsson if pt wishes to proceed with leg amputations otherwise his wounds can be cared for by his current home health arrangements   Ruta Hinds     In case the patient or [name] nursing calls [place]

## 2016-11-18 NOTE — Progress Notes (Signed)
Orthopedic Tech Progress Note Patient Details:  [name] [name] 29-Jul-1926 [id number]  Ortho Devices Type of Ortho Device: Louretta Parma boot Ortho Device/Splint Location: Bilateral unna boots Ortho Device/Splint Interventions: Application   [name] 11/18/2016, 2:35 PM

## 2016-11-19 DIAGNOSIS — I83002 Varicose veins of unspecified lower extremity with ulcer of calf: Secondary | ICD-10-CM

## 2016-11-19 DIAGNOSIS — L97202 Non-pressure chronic ulcer of unspecified calf with fat layer exposed: Secondary | ICD-10-CM

## 2016-11-19 DIAGNOSIS — I10 Essential (primary) hypertension: Secondary | ICD-10-CM

## 2016-11-19 DIAGNOSIS — N183 Chronic kidney disease, stage 3 (moderate): Secondary | ICD-10-CM

## 2016-11-19 DIAGNOSIS — I5032 Chronic diastolic (congestive) heart failure: Secondary | ICD-10-CM

## 2016-11-19 DIAGNOSIS — E875 Hyperkalemia: Secondary | ICD-10-CM

## 2016-11-19 DIAGNOSIS — I739 Peripheral vascular disease, unspecified: Secondary | ICD-10-CM

## 2016-11-19 LAB — CBC
HCT: 35.5 % — ABNORMAL LOW (ref 39.0–52.0)
Hemoglobin: 11.4 g/dL — ABNORMAL LOW (ref 13.0–17.0)
MCH: 30.2 pg (ref 26.0–34.0)
MCHC: 32.1 g/dL (ref 30.0–36.0)
MCV: 93.9 fL (ref 78.0–100.0)
Platelets: 259 10*3/uL (ref 150–400)
RBC: 3.78 MIL/uL — ABNORMAL LOW (ref 4.22–5.81)
RDW: 14.4 % (ref 11.5–15.5)
WBC: 5.4 10*3/uL (ref 4.0–10.5)

## 2016-11-19 LAB — COMPREHENSIVE METABOLIC PANEL
ALBUMIN: 2.6 g/dL — AB (ref 3.5–5.0)
ALK PHOS: 41 U/L (ref 38–126)
ALT: 8 U/L — ABNORMAL LOW (ref 17–63)
ANION GAP: 8 (ref 5–15)
AST: 22 U/L (ref 15–41)
BILIRUBIN TOTAL: 1.1 mg/dL (ref 0.3–1.2)
BUN: 19 mg/dL (ref 6–20)
CALCIUM: 8.6 mg/dL — AB (ref 8.9–10.3)
CO2: 26 mmol/L (ref 22–32)
Chloride: 102 mmol/L (ref 101–111)
Creatinine, Ser: 1.46 mg/dL — ABNORMAL HIGH (ref 0.61–1.24)
GFR, EST AFRICAN AMERICAN: 48 mL/min — AB (ref >60)
GFR, EST NON AFRICAN AMERICAN: 41 mL/min — AB (ref >60)
GLUCOSE: 96 mg/dL (ref 65–99)
POTASSIUM: 4.1 mmol/L (ref 3.5–5.1)
Sodium: 136 mmol/L (ref 135–145)
TOTAL PROTEIN: 7.1 g/dL (ref 6.5–8.1)

## 2016-11-19 NOTE — Progress Notes (Signed)
[facility]  Subjective  - feels better   Objective 113/69 78 98.2 F (36.8 C) (Oral) 18 94%  Intake/Output Summary (Last 24 hours) at 11/19/16 1542 Last data filed at 11/19/16 1421  Gross per 24 hour  Intake              702 ml  Output             1225 ml  Net             -523 ml   Leg dressings dry no longer saturated  Assessment/Planning: Discussed with pt amputation versus continued long term wound care.  He prefers wound care  Would elevate legs as much as possible to reduce edema  Protein calorie malnutrition as suspected would give protein supplements to encourage wound healing  Ruta Hinds 11/19/2016 3:42 PM --  Laboratory Lab Results:  Recent Labs  11/18/16 0457 11/19/16 0706  WBC 6.8 5.4  HGB 11.4* 11.4*  HCT 34.7* 35.5*  PLT 272 259   BMET  Recent Labs  11/18/16 0457 11/19/16 0706  NA 135 136  K 4.0 4.1  CL 101 102  CO2 27 26  GLUCOSE 136* 96  BUN 21* 19  CREATININE 1.54* 1.46*  CALCIUM 8.7* 8.6*    COAG Lab Results  Component Value Date   INR 1.03 10/30/2009   No results found for: PTT

## 2016-11-19 NOTE — Progress Notes (Signed)
[facility] notified that cardiac monitoring order has expired. Arthor Captain LPN

## 2016-11-19 NOTE — Progress Notes (Signed)
[facility] PROGRESS NOTE  [name] [name] BLT:[id number] DOB: [birth year] DOA: 11/17/2016 PCP: Alvester Chou, NP  Interim summary and HPI 83 y.o.malewith medical history significant for chronic diastolic CHF, chronic kidney disease stage III, hypertension, chronic venous stasis, and peripheral arterial disease, now presenting to the emergency department for evaluation of increased pain,swelling, and weeping from the bilateral lower legs.  Assessment/Plan: 1-bilateral lower extremity PAD and venous ulcers -vascular surgery found him not a candidate for revascularization  -options are for bilateral amputation vs continue wound care -patient expressed not understanding situation and plan of care -vascular surgery coming to see him today again; will follow rec's -appears either dementia (in case of poor insight and lack of comprehension vs denial) -will continue unna boots and wound care/supportive care for now  2-CKD stage 3 -Cr at baseline -will monitor trend intermittently  3-chronic diastolic heart failure -stable and compensated -will continue daily weight and strict intake/output -continue lasix  4-hyperkalemia -no EKG changes -potassium back to normal -will avoid supplementation -cautious with use of losartan  5-HTN -will continue verapamil and lasix -BP is stable  6-moderate protein malnutrition  -will continue nepro  7-hx of gout -no acute flare -will continue allopurinol  Code Status: Full Family Communication: no family at bedside  Disposition Plan: will follow vascular surgery recommendations from today;s visit and will also discussed with family members regarding treatment.   Consultants:  Vascular surgery   Procedures:  See below for x-ray results   Antibiotics:  None   HPI/Subjective: Afebrile, no CP, no SOB. Patient reported to be confuse about his plan of care. Despite multiple explanations and even use of analogies, he remains confused about  options provided by vascular surgery service. Will request for vascular surgery to see him again.  Objective: Vitals:   11/19/16 1608 11/19/16 2056  BP: (!) 114/56 110/61  Pulse: 66 70  Resp: 17 18  Temp: 98.9 F (37.2 C) 98.9 F (37.2 C)    Intake/Output Summary (Last 24 hours) at 11/19/16 2150 Last data filed at 11/19/16 2057  Gross per 24 hour  Intake              720 ml  Output             1125 ml  Net             -405 ml   Filed Weights   11/17/16 2309 11/18/16 2117 11/19/16 2056  Weight: 102.5 kg (226 lb) 102.5 kg (226 lb) 102.6 kg (226 lb 1.6 oz)    Exam:   General: afebrile, no CP and no SOB. Patient asking the same question over and over and expressing that he has trouble understanding plan of care. No family at bedside. In no acute distress.  Cardiovascular: S1 and S2, no rubs, no galllops  Respiratory: good air movement, no wheezing, no cackles  Abdomen: soft, Nt, ND, positive BS  Musculoskeletal: bilateral legs with unna boots in place; son malodorous perceived through dressing from his legs.  Data Reviewed: Basic Metabolic Panel:  Recent Labs Lab 11/17/16 1940 11/18/16 0457 11/19/16 0706  NA 138 135 136  K 5.7* 4.0 4.1  CL 99* 101 102  CO2  --  27 26  GLUCOSE 98 136* 96  BUN 35* 21* 19  CREATININE 1.40* 1.54* 1.46*  CALCIUM  --  8.7* 8.6*   Liver Function Tests:  Recent Labs Lab 11/19/16 0706  AST 22  ALT 8*  ALKPHOS 41  BILITOT 1.1  PROT 7.1  ALBUMIN 2.6*   CBC:  Recent Labs Lab 11/17/16 1940 11/17/16 2028 11/18/16 0457 11/19/16 0706  WBC  --  6.6 6.8 5.4  NEUTROABS  --  4.6 5.0  --   HGB 14.6 13.0 11.4* 11.4*  HCT 43.0 38.9* 34.7* 35.5*  MCV  --  94.9 93.8 93.9  PLT  --  276 272 259   CBG: No results for input(s): GLUCAP in the last 168 hours.  Recent Results (from the past 240 hour(s))  MRSA PCR Screening     Status: None   Collection Time: 11/18/16 11:03 AM  Result Value Ref Range Status   MRSA by PCR NEGATIVE  NEGATIVE Final    Comment:        The GeneXpert MRSA Assay (FDA approved for NASAL specimens only), is one component of a comprehensive MRSA colonization surveillance program. It is not intended to diagnose MRSA infection nor to guide or monitor treatment for MRSA infections.      Studies: No results found.  Scheduled Meds: . allopurinol  300 mg Oral Daily  . clobetasol cream   Topical BID  . furosemide  20 mg Oral TID  . gabapentin  300 mg Oral TID  . heparin  5,000 Units Subcutaneous Q8H  . sodium chloride flush  3 mL Intravenous Q12H  . sodium chloride flush  3 mL Intravenous Q12H  . verapamil  120 mg Oral Daily   Continuous Infusions: . sodium chloride      Principal Problem:   Venous stasis ulcer of lower extremity (HCC) Active Problems:   HTN (hypertension)   Atherosclerosis of native arteries of the extremities with ulceration (HCC)   CKD (chronic kidney disease) stage 3, GFR 30-59 ml/min   Chronic diastolic CHF (congestive heart failure) ([place])   Hyperkalemia   Venous stasis ulcer ([place])    Time spent: 25 minutes    Barton Dubois  [facility] Pager [phone]. If 7PM-7AM, please contact night-coverage at [email or web address], password [facility] 11/19/2016, 9:50 PM  LOS: 2 days

## 2016-11-20 MED ORDER — PRO-STAT SUGAR FREE PO LIQD
30.0000 mL | Freq: Two times a day (BID) | ORAL | Status: DC
  Administered 2016-11-20: 30 mL via ORAL
  Filled 2016-11-20: qty 30

## 2016-11-20 MED ORDER — ENSURE ENLIVE PO LIQD: 237.0000 mL | Freq: Every day | ORAL | 12 refills | Status: DC

## 2016-11-20 MED ORDER — ENSURE ENLIVE PO LIQD
237.0000 mL | Freq: Every day | ORAL | Status: DC
  Administered 2016-11-20: 237 mL via ORAL

## 2016-11-20 MED ORDER — PRO-STAT SUGAR FREE PO LIQD: 30.0000 mL | Freq: Two times a day (BID) | ORAL | 0 refills | Status: DC

## 2016-11-20 NOTE — Progress Notes (Signed)
Attempted to call family member and complete discharge. No answer. Will try again later

## 2016-11-20 NOTE — Discharge Summary (Signed)
Physician Discharge Summary  Edwyn Inclan DJM:[id number] DOB: 1926/09/17 DOA: 11/17/2016  PCP: Alvester Chou, NP  Admit date: 11/17/2016 Discharge date: 11/20/2016  Time spent: 35 minutes  Recommendations for Outpatient Follow-up:  Repeat BMET to follow electrolytes and renal function Please reassess BP and adjust antihypertensive regimen as needed  Needs close follow up by vascular surgery; still high risk for reinfection and non healing wounds; which ultimately will require amputation.  Discharge Diagnoses:  Principal Problem:   Venous stasis ulcer of lower extremity ([place]) Active Problems:   HTN (hypertension)   Atherosclerosis of native arteries of the extremities with ulceration (HCC)   CKD (chronic kidney disease) stage 3, GFR 30-59 ml/min   Chronic diastolic CHF (congestive heart failure) (HCC)   Hyperkalemia   Venous stasis ulcer ([place])   Discharge Condition: stable and with plans for [facility] services to provide PT and HHRN. Advise to follow up with PCP and vascular surgery as an outpatient.   Diet recommendation: heart healthy diet   Filed Weights   11/17/16 2309 11/18/16 2117 11/19/16 2056  Weight: 102.5 kg (226 lb) 102.5 kg (226 lb) 102.6 kg (226 lb 1.6 oz)    History of present illness:  83 y.o.malewith medical history significant for chronic diastolic CHF, chronic kidney disease stage III, hypertension, chronic venous stasis, and peripheral arterial disease, now presenting to the emergency department for evaluation of increased pain,swelling, and weeping from the bilateral lower legs.  Hospital Course:  1-bilateral lower extremity PAD and venous ulcers -vascular surgery found him not a candidate for revascularization  -options are for bilateral amputation vs continue wound care -patient clear on plan of care at discharge; would like to avoid amputation for now. -vascular surgery discussed once again with patient and he decided to pursuit wound care and no amputations.   -will continue unna boots and wound care/supportive care for now -outpatient follow up with vascular surgery  2-CKD stage 3 -Cr has remained at baseline -will recommend BMET at follow up to monitor trend intermittently  3-chronic diastolic heart failure -stable and compensated -advise to be compliant with daily weights and low sodium diet  -continue lasix as previously prescribed   4-hyperkalemia -no EKG changes -potassium back to normal -will avoid supplementation -cautious with use of losartan; will recommend BMET at follow up -if needed discontinue ARB  5-HTN -will continue verapamil, cozaar and lasix -BP is stable and well controlled.  6-moderate protein malnutrition  -will discharge on prostat and ensure  7-hx of gout -no acute flare appreciated -will continue allopurinol  Procedures:  See below for x-ray reports   Consultations:  Vascular surgery   Discharge Exam: Vitals:   11/20/16 0536 11/20/16 0853  BP: 106/60 112/64  Pulse: 68 62  Resp: (!) 21 19  Temp: 98.4 F (36.9 C) 98.2 F (36.8 C)    General: afebrile, no CP and no SOB. Patient able to report back plan of care at discharge and after discussing with vascular surgery once again opted to pursuit wound care only for now.  Cardiovascular: S1 and S2, no rubs, no galllops  Respiratory: good air movement, no wheezing, no cackles  Abdomen: soft, Nt, ND, positive BS  Musculoskeletal: bilateral legs with unna boots in place; son malodorous perceived through dressing from his legs.   Discharge Instructions   Discharge Instructions    Discharge instructions    Complete by:  As directed    Maintain legs elevated as much as possible Take medications as prescribed Please follow up with PCP  in 10 days Arrange follow up in 2-3 weeks with vascular surgery service Maintain adequate hydration and follow heart healthy diet     Current Discharge Medication List    START taking these  medications   Details  Amino Acids-Protein Hydrolys (FEEDING SUPPLEMENT, PRO-STAT SUGAR FREE 64,) LIQD Take 30 mLs by mouth 2 (two) times daily. Qty: 1800 mL, Refills: 0    feeding supplement, ENSURE ENLIVE, (ENSURE ENLIVE) LIQD Take 237 mLs by mouth daily at 2 PM. Qty: 5000 mL, Refills: 12      CONTINUE these medications which have NOT CHANGED   Details  allopurinol (ZYLOPRIM) 300 MG tablet Take 300 mg by mouth daily.    furosemide (LASIX) 20 MG tablet Take 20 mg by mouth 3 (three) times daily.    gabapentin (NEURONTIN) 100 MG capsule Take 3 capsules (300 mg total) by mouth 3 (three) times daily. Qty: 270 capsule, Refills: 0    losartan (COZAAR) 50 MG tablet Take 50 mg by mouth daily.    traMADol (ULTRAM) 50 MG tablet Take 100 mg by mouth 2 (two) times daily.    triamcinolone ointment (KENALOG) 0.1 % Apply 1 application topically 2 (two) times daily.    verapamil (VERELAN) 100 MG 24 hr capsule Take 100 mg by mouth daily.     Vitamin D, Ergocalciferol, (DRISDOL) 50000 units CAPS capsule Take 50,000 Units by mouth every 7 (seven) days.       No Known Allergies Follow-up Information    Alvester Chou, NP. Schedule an appointment as soon as possible for a visit in 10 day(s).   Specialty:  Nurse Practitioner Contact information: [place] [facility] [place] [address] [phone]        Conrad Alcan Border, MD. Schedule an appointment as soon as possible for a visit in 3 week(s).   Specialties:  Vascular Surgery, Cardiology Contact information: [address] [place] [address] [phone]           The results of significant diagnostics from this hospitalization (including imaging, microbiology, ancillary and laboratory) are listed below for reference.    Significant Diagnostic Studies: No results found.  Microbiology: Recent Results (from the past 240 hour(s))  MRSA PCR Screening     Status: None   Collection Time: 11/18/16 11:03 AM   Result Value Ref Range Status   MRSA by PCR NEGATIVE NEGATIVE Final    Comment:        The GeneXpert MRSA Assay (FDA approved for NASAL specimens only), is one component of a comprehensive MRSA colonization surveillance program. It is not intended to diagnose MRSA infection nor to guide or monitor treatment for MRSA infections.      Labs: Basic Metabolic Panel:  Recent Labs Lab 11/17/16 1940 11/18/16 0457 11/19/16 0706  NA 138 135 136  K 5.7* 4.0 4.1  CL 99* 101 102  CO2  --  27 26  GLUCOSE 98 136* 96  BUN 35* 21* 19  CREATININE 1.40* 1.54* 1.46*  CALCIUM  --  8.7* 8.6*   Liver Function Tests:  Recent Labs Lab 11/19/16 0706  AST 22  ALT 8*  ALKPHOS 41  BILITOT 1.1  PROT 7.1  ALBUMIN 2.6*   CBC:  Recent Labs Lab 11/17/16 1940 11/17/16 2028 11/18/16 0457 11/19/16 0706  WBC  --  6.6 6.8 5.4  NEUTROABS  --  4.6 5.0  --   HGB 14.6 13.0 11.4* 11.4*  HCT 43.0 38.9* 34.7* 35.5*  MCV  --  94.9 93.8 93.9  PLT  --  276 272 259    Signed:  Barton Dubois MD.  [facility] 11/20/2016, 4:16 PM

## 2016-11-20 NOTE — Evaluation (Signed)
Physical Therapy Evaluation Patient Details Name: [name] [name] MRN: [id number] DOB: [birth year] Today's Date: 11/20/2016   History of Present Illness  83 yo male with onset of weeping LE's wiht venous stasis ulcers and worsening PAD causing MD to discuss amputations of both LE's with pt.  PMHx:  CHF, CKD3, HTN, venous stasis, PAD, malnutrition, elevated K+    Clinical Impression  Pt is demonstrating actually good effort and control of mobility despite his poor LE circulation.  Has been seen by HHPT recently and plans to continue this upon dc.  He will be followed acutely by PT until dc is ready, and focus on mobility with walker and protection of skin on LE's with footwear and appropriate assistive devices.  Has been seen with his regular caregiver today who reports pt still drives and is out in the community alone.    Follow Up Recommendations Home health PT;Supervision for mobility/OOB    Equipment Recommendations  None recommended by PT    Recommendations for Other Services       Precautions / Restrictions Precautions Precautions: Fall Precaution Comments: PAD with worsening LE condition Restrictions Weight Bearing Restrictions: No      Mobility  Bed Mobility Overal bed mobility: Needs Assistance Bed Mobility: Supine to Sit;Sit to Supine     Supine to sit: Supervision Sit to supine: Supervision   General bed mobility comments: HOB elevated  Transfers Overall transfer level: Needs assistance Equipment used: Rolling walker (2 wheeled);1 person hand held assist Transfers: Sit to/from Stand Sit to Stand: Min guard            Ambulation/Gait Ambulation/Gait assistance: Min guard Ambulation Distance (Feet): 90 Feet Assistive device: Rolling walker (2 wheeled);1 person hand held assist Gait Pattern/deviations: Step-through pattern;Wide base of support;Shuffle;Decreased stride length Gait velocity: reduced Gait velocity interpretation: Below normal speed for  age/gender General Gait Details: pt is demonstrating some losses of strength and ROM in LE's related to chronic circulatory change  Stairs            Wheelchair Mobility    Modified Rankin (Stroke Patients Only)       Balance Overall balance assessment: Needs assistance Sitting-balance support: Feet supported Sitting balance-Leahy Scale: Good     Standing balance support: Bilateral upper extremity supported Standing balance-Leahy Scale: Fair                               Pertinent Vitals/Pain Pain Assessment: No/denies pain    Home Living Family/patient expects to be discharged to:: Private residence Living Arrangements: Alone Available Help at Discharge: Personal care attendant Type of Home: House Home Access: Level entry     Home Layout: One level Home Equipment: Environmental consultant - 4 wheels;Cane - single point Additional Comments: has walker with 4 wheels on a silver frame    Prior Function Level of [place]: Needs assistance   Gait / Transfers Assistance Needed: RW or SPC depending on assistance needed  ADL's / Homemaking Assistance Needed: has caregiver for housework and cooking        Hand Dominance        Extremity/Trunk Assessment   Upper Extremity Assessment Upper Extremity Assessment: Overall WFL for tasks assessed    Lower Extremity Assessment Lower Extremity Assessment: Generalized weakness    Cervical / Trunk Assessment Cervical / Trunk Assessment: Kyphotic  Communication   Communication: No difficulties  Cognition Arousal/Alertness: Awake/alert Behavior During Therapy: Impulsive Overall Cognitive Status: No family/caregiver present to  determine baseline cognitive functioning                                        General Comments General comments (skin integrity, edema, etc.): Pt is able to move at home and apparently was driving and out in the community at times per his caregiver.  He will continue on  with HHPT as ordered prior to this admission    Exercises     Assessment/Plan    PT Assessment Patient needs continued PT services  PT Problem List Decreased strength;Decreased range of motion;Decreased activity tolerance;Decreased balance;Decreased mobility;Decreased coordination;Decreased cognition;Decreased safety awareness;Decreased skin integrity       PT Treatment Interventions DME instruction;Gait training;Functional mobility training;Therapeutic activities;Therapeutic exercise;Balance training;Neuromuscular re-education;Patient/family education    PT Goals (Current goals can be found in the Care Plan section)  Acute Rehab PT Goals Patient Stated Goal: to get home and get his legs better PT Goal Formulation: With patient Time For Goal Achievement: 12/04/16 Potential to Achieve Goals: Good    Frequency Min 3X/week   Barriers to discharge Decreased caregiver support has a caregiver half the day    Co-evaluation               AM-PAC PT "6 Clicks" Daily Activity  Outcome Measure Difficulty turning over in bed (including adjusting bedclothes, sheets and blankets)?: A Little Difficulty moving from lying on back to sitting on the side of the bed? : A Little Difficulty sitting down on and standing up from a chair with arms (e.g., wheelchair, bedside commode, etc,.)?: A Little Help needed moving to and from a bed to chair (including a wheelchair)?: A Little Help needed walking in hospital room?: A Little Help needed climbing 3-5 steps with a railing? : A Little 6 Click Score: 18    End of Session Equipment Utilized During Treatment: Gait belt Activity Tolerance: Patient tolerated treatment well;Patient limited by fatigue Patient left: in bed;with call bell/phone within reach;with bed alarm set;with family/visitor present Nurse Communication: Mobility status PT Visit Diagnosis: Muscle weakness (generalized) (M62.81);Difficulty in walking, not elsewhere classified  (R26.2)    Time: 6948-5462 PT Time Calculation (min) (ACUTE ONLY): 31 min   Charges:   PT Evaluation $PT Eval Moderate Complexity: 1 Procedure PT Treatments $Gait Training: 8-22 mins   PT G Codes:   PT G-Codes **NOT FOR INPATIENT CLASS** Functional Assessment Tool Used: AM-PAC 6 Clicks Basic Mobility    Ramond Dial 11/20/2016, 3:05 PM  Mee Hives, PT MS Acute Rehab Dept. Number: [place] and [place]

## 2016-11-20 NOTE — Progress Notes (Signed)
Initial Nutrition Assessment  DOCUMENTATION CODES:   Obesity unspecified  INTERVENTION:   Recommend Pro-Stat 30 mL BID  Ensure Enlive po daily, each supplement provides 350 kcal and 20 grams of protein  Reinforced the importance of adequate protein intake with regards to wound healing. Reviewed sources of protein. Pt currently eats a diet fairly low in protein  Pt may benefit from addition of MVI   NUTRITION DIAGNOSIS:   Increased nutrient needs related to wound healing as evidenced by estimated needs.   GOAL:   Patient will meet greater than or equal to 90% of their needs  MONITOR:   PO intake, Supplement acceptance, Labs, Weight trends  REASON FOR ASSESSMENT:   Consult Wound healing  ASSESSMENT:    83 yo male admitted with pain, swelling and weeping from bilateral LE. Pt with hx of CHD, CKD III, HTN, chronic venous stasis, PAD   Per vascular surgery, options for wounds are B/L amputation vs continued wound care. Pt opted for continued wound care  Pt reports appetite has been good at home; usually eats 3 meals per day at home. Pt indicates that he does not eat a lot of meat at home, reports this is partially due to being edentulous. Pt reports he mostly eats potatoes, greens and other vegetables and fruits. Sometimes he eats beans but very rarely eats meat or eggs. Per diet recall, pt diet is not very high in protein. Reviewed importance of adequate protein with regards to wound healing and reviewed sources of protein. Pt agreeable to protein supplement at this time as well.   Pt reports weight has been stable. Per weight encounters no major changes in weight.   Nutrition-Focused physical exam completed. Findings are WDL for fat depletion and muscle depletion and no edema.   Labs: reviewed Meds: lasix  Diet Order:  Diet Heart Room service appropriate? Yes; Fluid consistency: Thin; Fluid restriction: 1500 mL Fluid  Skin:  Reviewed, no issues  Last BM:   7/23  Height:   Ht Readings from Last 1 Encounters:  11/17/16 5\' 10"  (1.778 m)    Weight:   Wt Readings from Last 1 Encounters:  11/19/16 226 lb 1.6 oz (102.6 kg)    Ideal Body Weight:     BMI:  Body mass index is 32.44 kg/m.  Estimated Nutritional Needs:   Kcal:  9758-8325 kcals  Protein:  105-120 g  Fluid:  >/= 2 L  EDUCATION NEEDS:   Education needs addressed  Kerman Passey MS, RD, LDN [phone] Pager  [phone] Weekend/On-Call Pager

## 2016-11-21 NOTE — Care Management Note (Signed)
Case Management Note      Late Entry  Patient Details  Name: [name] [name] MRN: [id number] Date of Birth: 03/15/1927  Subjective/Objective:     CM following for progression and d/c planning.                Action/Plan: 11/20/2016 Met with pt who has difficulty remember his [facility] provider name however he is sure that they are from [place] , after multiple call this CM was able to speak with a rep for [facility] in [place] who verified that this pt is active with [facility]. Orders faxed to [facility]. Pt states that he has a walker and cane, no other DME needs identified.  Pt will d/c to home, his daughter will provide transportation.   Expected Discharge Date:  11/20/16               Expected Discharge Plan:  [place]  In-House Referral:  NA  Discharge planning Services  CM Consult  Post Acute Care Choice:  Home Health Choice offered to:  Patient  DME Arranged:  N/A DME Agency:  NA  HH Arranged:  RN, PT HH Agency:  [facility]  Status of Service:  Completed, signed off  If discussed at [place] of Stay Meetings, dates discussed:    Additional Comments:  Adron Bene, RN 11/21/2016, 1:36 PM

## 2017-10-04 ENCOUNTER — Emergency Department (HOSPITAL_COMMUNITY): Payer: Medicare Other

## 2017-10-04 ENCOUNTER — Encounter (HOSPITAL_COMMUNITY): Payer: Self-pay | Admitting: Emergency Medicine

## 2017-10-04 ENCOUNTER — Inpatient Hospital Stay (HOSPITAL_COMMUNITY)
Admission: EM | Admit: 2017-10-04 | Discharge: 2017-10-13 | DRG: 240 | Disposition: A | Discharge status: Skilled Nursing Facility | Payer: Medicare Other | Attending: Student in an Organized Health Care Education/Training Program | Admitting: Student in an Organized Health Care Education/Training Program

## 2017-10-04 ENCOUNTER — Other Ambulatory Visit: Payer: Self-pay

## 2017-10-04 DIAGNOSIS — Z89612 Acquired absence of left leg above knee: Secondary | ICD-10-CM | POA: Diagnosis not present

## 2017-10-04 DIAGNOSIS — Z86718 Personal history of other venous thrombosis and embolism: Secondary | ICD-10-CM | POA: Diagnosis not present

## 2017-10-04 DIAGNOSIS — I959 Hypotension, unspecified: Secondary | ICD-10-CM | POA: Diagnosis not present

## 2017-10-04 DIAGNOSIS — L03115 Cellulitis of right lower limb: Secondary | ICD-10-CM | POA: Diagnosis present

## 2017-10-04 DIAGNOSIS — M109 Gout, unspecified: Secondary | ICD-10-CM | POA: Diagnosis present

## 2017-10-04 DIAGNOSIS — I872 Venous insufficiency (chronic) (peripheral): Secondary | ICD-10-CM | POA: Diagnosis present

## 2017-10-04 DIAGNOSIS — E1151 Type 2 diabetes mellitus with diabetic peripheral angiopathy without gangrene: Secondary | ICD-10-CM | POA: Diagnosis not present

## 2017-10-04 DIAGNOSIS — Z79899 Other long term (current) drug therapy: Secondary | ICD-10-CM | POA: Diagnosis not present

## 2017-10-04 DIAGNOSIS — Z515 Encounter for palliative care: Secondary | ICD-10-CM | POA: Diagnosis not present

## 2017-10-04 DIAGNOSIS — I10 Essential (primary) hypertension: Secondary | ICD-10-CM

## 2017-10-04 DIAGNOSIS — Z6835 Body mass index (BMI) 35.0-35.9, adult: Secondary | ICD-10-CM

## 2017-10-04 DIAGNOSIS — E1152 Type 2 diabetes mellitus with diabetic peripheral angiopathy with gangrene: Principal | ICD-10-CM | POA: Diagnosis present

## 2017-10-04 DIAGNOSIS — L97929 Non-pressure chronic ulcer of unspecified part of left lower leg with unspecified severity: Secondary | ICD-10-CM | POA: Diagnosis present

## 2017-10-04 DIAGNOSIS — Z7189 Other specified counseling: Secondary | ICD-10-CM | POA: Diagnosis not present

## 2017-10-04 DIAGNOSIS — L97919 Non-pressure chronic ulcer of unspecified part of right lower leg with unspecified severity: Secondary | ICD-10-CM | POA: Diagnosis present

## 2017-10-04 DIAGNOSIS — M1A9XX0 Chronic gout, unspecified, without tophus (tophi): Secondary | ICD-10-CM | POA: Diagnosis not present

## 2017-10-04 DIAGNOSIS — Z993 Dependence on wheelchair: Secondary | ICD-10-CM | POA: Diagnosis not present

## 2017-10-04 DIAGNOSIS — L0390 Cellulitis, unspecified: Secondary | ICD-10-CM | POA: Diagnosis present

## 2017-10-04 DIAGNOSIS — N183 Chronic kidney disease, stage 3 unspecified: Secondary | ICD-10-CM

## 2017-10-04 DIAGNOSIS — Z87891 Personal history of nicotine dependence: Secondary | ICD-10-CM

## 2017-10-04 DIAGNOSIS — L8990 Pressure ulcer of unspecified site, unspecified stage: Secondary | ICD-10-CM

## 2017-10-04 DIAGNOSIS — I739 Peripheral vascular disease, unspecified: Secondary | ICD-10-CM | POA: Diagnosis present

## 2017-10-04 DIAGNOSIS — Z7401 Bed confinement status: Secondary | ICD-10-CM | POA: Diagnosis not present

## 2017-10-04 DIAGNOSIS — E1122 Type 2 diabetes mellitus with diabetic chronic kidney disease: Secondary | ICD-10-CM | POA: Diagnosis present

## 2017-10-04 DIAGNOSIS — L03116 Cellulitis of left lower limb: Secondary | ICD-10-CM | POA: Diagnosis present

## 2017-10-04 DIAGNOSIS — E11622 Type 2 diabetes mellitus with other skin ulcer: Secondary | ICD-10-CM | POA: Diagnosis present

## 2017-10-04 DIAGNOSIS — L89152 Pressure ulcer of sacral region, stage 2: Secondary | ICD-10-CM | POA: Diagnosis present

## 2017-10-04 DIAGNOSIS — Z89611 Acquired absence of right leg above knee: Secondary | ICD-10-CM | POA: Diagnosis not present

## 2017-10-04 DIAGNOSIS — Z89512 Acquired absence of left leg below knee: Secondary | ICD-10-CM | POA: Diagnosis not present

## 2017-10-04 DIAGNOSIS — I5030 Unspecified diastolic (congestive) heart failure: Secondary | ICD-10-CM | POA: Diagnosis not present

## 2017-10-04 DIAGNOSIS — I70248 Atherosclerosis of native arteries of left leg with ulceration of other part of lower left leg: Secondary | ICD-10-CM | POA: Diagnosis not present

## 2017-10-04 DIAGNOSIS — M79671 Pain in right foot: Secondary | ICD-10-CM | POA: Diagnosis present

## 2017-10-04 DIAGNOSIS — I96 Gangrene, not elsewhere classified: Secondary | ICD-10-CM | POA: Diagnosis present

## 2017-10-04 DIAGNOSIS — I130 Hypertensive heart and chronic kidney disease with heart failure and stage 1 through stage 4 chronic kidney disease, or unspecified chronic kidney disease: Secondary | ICD-10-CM | POA: Diagnosis present

## 2017-10-04 DIAGNOSIS — Z419 Encounter for procedure for purposes other than remedying health state, unspecified: Secondary | ICD-10-CM

## 2017-10-04 DIAGNOSIS — I70238 Atherosclerosis of native arteries of right leg with ulceration of other part of lower right leg: Secondary | ICD-10-CM | POA: Diagnosis not present

## 2017-10-04 DIAGNOSIS — L03119 Cellulitis of unspecified part of limb: Secondary | ICD-10-CM | POA: Diagnosis not present

## 2017-10-04 DIAGNOSIS — Z9889 Other specified postprocedural states: Secondary | ICD-10-CM | POA: Diagnosis not present

## 2017-10-04 DIAGNOSIS — L89890 Pressure ulcer of other site, unstageable: Secondary | ICD-10-CM | POA: Diagnosis not present

## 2017-10-04 DIAGNOSIS — Z89511 Acquired absence of right leg below knee: Secondary | ICD-10-CM | POA: Diagnosis not present

## 2017-10-04 DIAGNOSIS — I5032 Chronic diastolic (congestive) heart failure: Secondary | ICD-10-CM | POA: Diagnosis present

## 2017-10-04 DIAGNOSIS — R0989 Other specified symptoms and signs involving the circulatory and respiratory systems: Secondary | ICD-10-CM | POA: Diagnosis not present

## 2017-10-04 DIAGNOSIS — E785 Hyperlipidemia, unspecified: Secondary | ICD-10-CM | POA: Diagnosis present

## 2017-10-04 LAB — BASIC METABOLIC PANEL
Anion gap: 12 (ref 5–15)
BUN: 65 mg/dL — ABNORMAL HIGH (ref 6–20)
CHLORIDE: 94 mmol/L — AB (ref 101–111)
CO2: 28 mmol/L (ref 22–32)
CREATININE: 1.99 mg/dL — AB (ref 0.61–1.24)
Calcium: 9.3 mg/dL (ref 8.9–10.3)
GFR calc non Af Amer: 28 mL/min — ABNORMAL LOW (ref >60)
GFR, EST AFRICAN AMERICAN: 33 mL/min — AB (ref >60)
GLUCOSE: 142 mg/dL — AB (ref 65–99)
Potassium: 3.8 mmol/L (ref 3.5–5.1)
Sodium: 134 mmol/L — ABNORMAL LOW (ref 135–145)

## 2017-10-04 LAB — I-STAT CG4 LACTIC ACID, ED
LACTIC ACID, VENOUS: 3.19 mmol/L — AB (ref 0.5–1.9)
Lactic Acid, Venous: 2.52 mmol/L (ref 0.5–1.9)

## 2017-10-04 LAB — URINALYSIS, ROUTINE W REFLEX MICROSCOPIC
Bilirubin Urine: NEGATIVE
GLUCOSE, UA: NEGATIVE mg/dL
Hgb urine dipstick: NEGATIVE
Ketones, ur: NEGATIVE mg/dL
LEUKOCYTES UA: NEGATIVE
Nitrite: NEGATIVE
Protein, ur: NEGATIVE mg/dL
Specific Gravity, Urine: 1.01 (ref 1.005–1.030)
pH: 5 (ref 5.0–8.0)

## 2017-10-04 LAB — I-STAT CHEM 8, ED
BUN: 60 mg/dL — ABNORMAL HIGH (ref 6–20)
CHLORIDE: 95 mmol/L — AB (ref 101–111)
Calcium, Ion: 1.16 mmol/L (ref 1.15–1.40)
Creatinine, Ser: 1.8 mg/dL — ABNORMAL HIGH (ref 0.61–1.24)
Glucose, Bld: 143 mg/dL — ABNORMAL HIGH (ref 65–99)
HCT: 41 % (ref 39.0–52.0)
Hemoglobin: 13.9 g/dL (ref 13.0–17.0)
POTASSIUM: 4 mmol/L (ref 3.5–5.1)
SODIUM: 137 mmol/L (ref 135–145)
TCO2: 28 mmol/L (ref 22–32)

## 2017-10-04 LAB — LACTIC ACID, PLASMA
Lactic Acid, Venous: 3 mmol/L (ref 0.5–1.9)
Lactic Acid, Venous: 2.7 mmol/L (ref 0.5–1.9)

## 2017-10-04 LAB — CBC
HEMATOCRIT: 39.4 % (ref 39.0–52.0)
HEMOGLOBIN: 12.7 g/dL — AB (ref 13.0–17.0)
MCH: 30.2 pg (ref 26.0–34.0)
MCHC: 32.2 g/dL (ref 30.0–36.0)
MCV: 93.6 fL (ref 78.0–100.0)
Platelets: 369 10*3/uL (ref 150–400)
RBC: 4.21 MIL/uL — ABNORMAL LOW (ref 4.22–5.81)
RDW: 13.4 % (ref 11.5–15.5)
WBC: 6.1 10*3/uL (ref 4.0–10.5)

## 2017-10-04 LAB — GLUCOSE, CAPILLARY: GLUCOSE-CAPILLARY: 96 mg/dL (ref 65–99)

## 2017-10-04 MED ORDER — SODIUM CHLORIDE 0.9 % IV BOLUS
1000.0000 mL | Freq: Once | INTRAVENOUS | Status: AC
  Administered 2017-10-04: 1000 mL via INTRAVENOUS

## 2017-10-04 MED ORDER — MORPHINE SULFATE (PF) 4 MG/ML IV SOLN
4.0000 mg | Freq: Once | INTRAVENOUS | Status: AC
  Administered 2017-10-04: 4 mg via INTRAVENOUS
  Filled 2017-10-04: qty 1

## 2017-10-04 MED ORDER — HEPARIN SODIUM (PORCINE) 5000 UNIT/ML IJ SOLN
5000.0000 [IU] | Freq: Three times a day (TID) | INTRAMUSCULAR | Status: DC
  Administered 2017-10-04 – 2017-10-07 (×9): 5000 [IU] via SUBCUTANEOUS
  Filled 2017-10-04 (×9): qty 1

## 2017-10-04 MED ORDER — PIPERACILLIN-TAZOBACTAM 3.375 G IVPB 30 MIN
3.3750 g | Freq: Once | INTRAVENOUS | Status: AC
  Administered 2017-10-04: 3.375 g via INTRAVENOUS
  Filled 2017-10-04: qty 50

## 2017-10-04 MED ORDER — SODIUM CHLORIDE 0.9 % IV SOLN
INTRAVENOUS | Status: DC
  Administered 2017-10-04: 20:00:00 via INTRAVENOUS

## 2017-10-04 MED ORDER — SODIUM CHLORIDE 0.9 % IV SOLN
2.0000 g | INTRAVENOUS | Status: DC
  Administered 2017-10-04 – 2017-10-07 (×4): 2 g via INTRAVENOUS
  Filled 2017-10-04 (×4): qty 20

## 2017-10-04 MED ORDER — VANCOMYCIN HCL IN DEXTROSE 1-5 GM/200ML-% IV SOLN
1000.0000 mg | Freq: Once | INTRAVENOUS | Status: AC
  Administered 2017-10-04: 1000 mg via INTRAVENOUS
  Filled 2017-10-04: qty 200

## 2017-10-04 MED ORDER — ADULT MULTIVITAMIN W/MINERALS CH
1.0000 | ORAL_TABLET | Freq: Every day | ORAL | Status: DC
  Administered 2017-10-05 – 2017-10-13 (×9): 1 via ORAL
  Filled 2017-10-04 (×9): qty 1

## 2017-10-04 MED ORDER — VANCOMYCIN HCL 10 G IV SOLR
1500.0000 mg | INTRAVENOUS | Status: DC
  Administered 2017-10-06: 1500 mg via INTRAVENOUS
  Filled 2017-10-04: qty 1500

## 2017-10-04 MED ORDER — VERAPAMIL HCL ER 180 MG PO TBCR
180.0000 mg | EXTENDED_RELEASE_TABLET | Freq: Every day | ORAL | Status: DC
  Administered 2017-10-05 – 2017-10-11 (×6): 180 mg via ORAL
  Filled 2017-10-04 (×7): qty 1

## 2017-10-04 MED ORDER — METRONIDAZOLE 500 MG PO TABS
500.0000 mg | ORAL_TABLET | Freq: Three times a day (TID) | ORAL | Status: DC
  Administered 2017-10-04 – 2017-10-07 (×10): 500 mg via ORAL
  Filled 2017-10-04 (×10): qty 1

## 2017-10-04 MED ORDER — TRAMADOL HCL 50 MG PO TABS
100.0000 mg | ORAL_TABLET | Freq: Three times a day (TID) | ORAL | Status: DC | PRN
  Administered 2017-10-05 – 2017-10-12 (×8): 100 mg via ORAL
  Filled 2017-10-04 (×8): qty 2

## 2017-10-04 MED ORDER — GABAPENTIN 100 MG PO CAPS: 100.0000 mg | ORAL_CAPSULE | Freq: Every morning | ORAL | Status: DC

## 2017-10-04 MED ORDER — GABAPENTIN 300 MG PO CAPS
300.0000 mg | ORAL_CAPSULE | Freq: Every day | ORAL | Status: DC
Start: 2017-10-04 — End: 2017-10-13
  Administered 2017-10-04 – 2017-10-12 (×8): 300 mg via ORAL
  Filled 2017-10-04 (×9): qty 1

## 2017-10-04 MED ORDER — GABAPENTIN 100 MG PO CAPS
100.0000 mg | ORAL_CAPSULE | Freq: Two times a day (BID) | ORAL | Status: DC
  Administered 2017-10-05 – 2017-10-13 (×17): 100 mg via ORAL
  Filled 2017-10-04 (×17): qty 1

## 2017-10-04 NOTE — Progress Notes (Signed)
CRITICAL VALUE ALERT  Critical Value:  Lactic Acid 3.0  Date & Time Notied:  10/04/2017  2055  Provider Notified: Nedrud  Orders Received/Actions taken: Pt Lactic is trending down, already on fluids IV. Continue to monitor labs.   Eleanora Neighbor, RN

## 2017-10-04 NOTE — Progress Notes (Signed)
Patient admitted from home,lives alone.Skin issues are weeping cellulitis on bilateral lower extremities from below knee level going down to toes.Right foot dorsal surface unstageable fool ulceration near the 2nd and 3rd toe 1.5 x 4 cm x .1 with scant drainage.Right buttocks stage II 1cm x 1.5 ,no drainage,pink foam dressing applied.Skin assessed with Anisha Mabe R.N.

## 2017-10-04 NOTE — Progress Notes (Signed)
CRITICAL VALUE ALERT  Critical Value:  Lactic Acid 2.7  Date & Time Notied:  10/04/2017  2327  Provider Notified: Fedrud  Orders Received/Actions taken: No new orders

## 2017-10-04 NOTE — H&P (Addendum)
Date: 10/04/2017               Patient Name:  [name] [name] MRN: [id number]  DOB: 1926-08-31 Age / Sex: 84 y.o., male   PCP: Alvester Chou, NP         Medical Service: Internal Medicine Teaching Service         Attending Physician: Dr. Annia Belt, MD    First Contact: Dr. [name] Pager: [phone]  Second Contact: Dr. Hetty Ely Pager: [phone]       After Hours (After 5p/  First Contact Pager: [phone]  weekends / holidays): Second Contact Pager: [phone]   Chief Complaint: gout pain  History of Present Illness: 84 yo male with PMH of PAD, T2DM, HFpEF, gout.  He is  difficult to obtain meaningful history from due to poor understanding he will many times answer questions inappropriately and incompletely I expect he has vascular dementia.  He says he is here to address his worsening pain from gout.  It is difficult to discern where exactly he feels this pain seems like it may be the left ankle.  He has had extensive vascular workup in the past and consultation is followed by Dr. Bridgett Larsson, bilateral amputation was recommended but the patient refused at that time.  It is unclear if the patient understands or possesses the ability to understand the consequences of not undergoing amputation.  I was able to contact his aid who is listed as his daughter in the emergency contacts.  She maintains that he understands but has been "too stubborn to get the amputations".  He is still receiving Unna boot wraps 3x per week.  According to her he has no family close by and she has been his primary caretaker. The patient reports that he has had some subjective fever and chills but no nausea or vomiting.  He denies feeling SOB and denies any chest pain.  He denies any dysuria.    ED course: pt found to have elevated lactic acid given empiric vanc zosyn were started along with NS boluses.  His right and left foot x-rays did not show signs of osteomyelitis.  They did show calcified peripheral vascular disease.  He had  no elevation in white count, a mild normocytic anemia.  His bmp showed worsening of his CKD compared with 32months ago but otherwise was unremarkable.    Meds:  Current Meds  Medication Sig  . allopurinol (ZYLOPRIM) 300 MG tablet Take 300 mg by mouth daily.  . colchicine 0.6 MG tablet Take 0.6 mg by mouth daily.  . feeding supplement, ENSURE ENLIVE, (ENSURE ENLIVE) LIQD Take 237 mLs by mouth daily at 2 PM.  . furosemide (LASIX) 20 MG tablet Take 20 mg by mouth 3 (three) times daily.  Marland Kitchen gabapentin (NEURONTIN) 100 MG capsule Take 100 mg by mouth See admin instructions. Patient takes 1 Capsule in the Morning, 1 Capsule every evening and 3 Capsules at Bedtime.  Marland Kitchen losartan (COZAAR) 25 MG tablet Take 25 mg by mouth daily.  . traMADol (ULTRAM) 50 MG tablet Take 100 mg by mouth 3 (three) times daily as needed for moderate pain.   . verapamil (VERELAN PM) 180 MG 24 hr capsule Take 180 mg by mouth daily.     Allergies: Allergies as of 10/04/2017  . (No Known Allergies)   Past Medical History:  Diagnosis Date  . Chronic kidney disease    RENAL INSUFICCIENCY  . Diabetes mellitus without complication ([place])   . DVT (deep  venous thrombosis) ([place])   . Gout   . Hypertension   . Lymphadenitis 08/10/2012  . Psoriasis     Family History:  Family History  Problem Relation Age of Onset  . Hypertension Mother   . Stroke Mother   . Hypertension Father     Social History:  Social History   Socioeconomic History  . Marital status: Widowed    Spouse name: Not on file  . Number of children: Not on file  . Years of education: Not on file  . Highest education level: Not on file  Occupational History  . Not on file  Social Needs  . Financial resource strain: Not on file  . Food insecurity:    Worry: Not on file    Inability: Not on file  . Transportation needs:    Medical: Not on file    Non-medical: Not on file  Tobacco Use  . Smoking status: Former Smoker    Years: 40.00    Types:  Pipe  . Smokeless tobacco: Never Used  Substance and Sexual Activity  . Alcohol use: No  . Drug use: No  . Sexual activity: Not on file  Lifestyle  . Physical activity:    Days per week: Not on file    Minutes per session: Not on file  . Stress: Not on file  Relationships  . Social connections:    Talks on phone: Not on file    Gets together: Not on file    Attends religious service: Not on file    Active member of club or organization: Not on file    Attends meetings of clubs or organizations: Not on file    Relationship status: Not on file  . Intimate partner violence:    Fear of current or ex partner: Not on file    Emotionally abused: Not on file    Physically abused: Not on file    Forced sexual activity: Not on file  Other Topics Concern  . Not on file  Social History Narrative  . Not on file    Review of Systems: A complete ROS was negative except as per HPI.   Physical Exam: Blood pressure 120/85, pulse (!) 107, temperature 97.9 F (36.6 C), temperature source Oral, resp. rate (!) 26, height 5\' 10"  (1.778 m), weight 247 lb (112 kg), SpO2 100 %. Physical Exam  Constitutional: He appears well-developed and well-nourished.  Pt with some mild rigors that occurred during our examination.  HENT:  Head: Normocephalic and atraumatic.  Eyes: Right eye exhibits no discharge. Left eye exhibits no discharge. No scleral icterus.  Cardiovascular: Normal rate and regular rhythm.  Occasional extrasystoles are present. Exam reveals distant heart sounds. Exam reveals no gallop and no friction rub.  No murmur heard. Pulses:      Dorsalis pedis pulses are 0 on the right side, and 0 on the left side.       Posterior tibial pulses are 0 on the right side, and 0 on the left side.  The left foot is cool to the touch from the midpoint of the dorsum of the foot to the toes.  He does have sensation on the plantar surface and it is not tender to palpation.  The right foot is cool as well  but less so than the left.   Pulmonary/Chest: Effort normal. No respiratory distress. He has no wheezes. He has rales in the left lower field.  Abdominal: Soft. Bowel sounds are normal. He exhibits no  distension and no mass. There is no tenderness. There is no guarding.  Musculoskeletal: He exhibits edema (3+ bilateral LE edema).  Neurological: He is alert.  Preserved strength in lower extremities bilaterally but decreased sensation to light touch.    Skin:  There are psoriatic plaques seen throughout the surface of the skin in a patchy distribution, worse on the anterior surface of the lower extremities.  The lower extremities from the knee down have corresponding patchy pink granulation tissue.  There are several ulcerations on the left leg>right leg.   Additionally there is a stage II sacral decubitus ulcer and some mild skin breakdown on the anterior scrotum.    Media Information         Document Information   Photos    10/04/2017 16:25  Attached To:  Hospital Encounter on 10/04/17   Source Information   Genell Thede, Jenne Pane, MD  [facility]     Media Information         Document Information   Photos    10/04/2017 16:27  Attached To:  Hospital Encounter on 10/04/17   Source Information   Kalley Nicholl, Jenne Pane, MD  [facility]      Media Information         Document Information   Photos    10/04/2017 16:31  Attached To:  Hospital Encounter on 10/04/17   Source Information   Khaleel Beckom, Jenne Pane, MD  [facility]    EKG: personally reviewed my interpretation is none available  CXR: personally reviewed my interpretation is no acute cardiopulmonary process  Assessment & Plan by Problem: Active Problems:   Cellulitis   PAD (peripheral artery disease) ([place])  84 yo male with likely vascular dementia presents with worsening of his peripheral vascular disease  with ulcerations and skin breakdown bilaterally with concern for  infection.    Ulcerations of bilateral LE with surrounding cellulitis secondary to PAD: progressive process secondary to T2DM, hyperlipidemia.  Left leg worse than right.  It is unlikely that he understands the implications of not receiving amputation.    -will administer vanc, ceftriaxone, metronidazole for continued empiric coverage -follow up blood cultures -received 2 L NS so far will start infusion NS 75 cc/hr -trend lactate -CK -will consult vascular surgery in am and attempt to track down more family -resumed home tramadol for pain control  HFpEF: last ECHO 2014 showed G2DD with normal EF.  Does not appear volume overloaded on exam other than LE edema which is chronic. He is on lasix 20mg  TID at home.    -will hold in setting of increased lactic acid and worsened renal function -continue to assess volume status  T2DM: pt states that he is borderline diabetic he is currently on no therapy for diabetes last known a1c 6.6 in 2016  -will get a1c in am -monitor and possibly start SSI  Gout: this was the patient's chief complaint, It is difficult to ascertain by exam whether his pain is from gout vs worsening vascular disease.  He takes allopurinol 300mg  daily and colchicine 0.6mg  daily  -will get a uric acid level in the am  CKD 3: cr baseline appears to be about 1.5 pt is on losartan 25mg  daily  -will continue to monitor -holding losartan and lasix for now    Dispo: Admit patient to Inpatient with expected length of stay greater than 2 midnights.  Signed: Katherine Roan, MD 10/04/2017, 6:02 PM  Vickki Muff MD PGY-1 Internal Medicine Pager # [phone]

## 2017-10-04 NOTE — Discharge Summary (Signed)
Name: [name] [name] MRN: [id number] DOB: 1927/01/05 84 y.o. PCP: Alvester Chou, NP  Date of Admission: 10/04/2017 12:29 PM Date of Discharge: 10/13/17 Attending Physician: Axel Filler, *  Discharge Diagnosis: PAD Lower extremity cellulitis S/P Bilateral AKA  Discharge Medications: Allergies as of 10/13/2017   No Known Allergies     Medication List    STOP taking these medications   losartan 25 MG tablet Commonly known as:  COZAAR   verapamil 180 MG 24 hr capsule Commonly known as:  VERELAN PM     TAKE these medications   allopurinol 300 MG tablet Commonly known as:  ZYLOPRIM Take 300 mg by mouth daily.   colchicine 0.6 MG tablet Take 0.6 mg by mouth daily.   feeding supplement (ENSURE ENLIVE) Liqd Take 237 mLs by mouth daily at 2 PM.   feeding supplement (PRO-STAT SUGAR FREE 64) Liqd Take 30 mLs by mouth 2 (two) times daily.   furosemide 20 MG tablet Commonly known as:  LASIX Take 1 tablet (20 mg total) by mouth daily. What changed:  when to take this   gabapentin 100 MG capsule Commonly known as:  NEURONTIN Take 100 mg by mouth See admin instructions. Patient takes 1 Capsule in the Morning, 1 Capsule every evening and 3 Capsules at Bedtime.   hydrocerin Crea Apply 1 application topically 2 (two) times daily.   multivitamin with minerals Tabs tablet Take 1 tablet by mouth daily.   polyethylene glycol packet Commonly known as:  MIRALAX / GLYCOLAX Take 17 g by mouth daily.   protein supplement shake Liqd Commonly known as:  PREMIER PROTEIN Take 325 mLs (11 oz total) by mouth daily.   ramelteon 8 MG tablet Commonly known as:  ROZEREM Take 1 tablet (8 mg total) by mouth at bedtime.   sodium chloride 0.65 % Soln nasal spray Commonly known as:  OCEAN Place 1-2 sprays into both nostrils as needed for congestion.   traMADol 50 MG tablet Commonly known as:  ULTRAM Take 2 tablets (100 mg total) by mouth every 12 (twelve) hours as needed for  moderate pain. What changed:  when to take this   triamcinolone ointment 0.1 % Commonly known as:  KENALOG Apply 1 application topically 2 (two) times daily.   Vitamin D (Ergocalciferol) 50000 units Caps capsule Commonly known as:  DRISDOL Take 50,000 Units by mouth every 7 (seven) days.       Disposition and follow-up:   [name] was discharged from [facility] in Stable condition.  At the hospital follow up visit please address:  PAD Lower extremity cellulitis S/P Bilateral AKA HFpEF  -ensure pt is working with physical therapy -pt has follow up appointment with vascular surgery on 7/17 -monitor volume status and adjust diuretic as indicated -home bp meds were held due to post op hypotension continue to monitor, add back if necessary  2.  Labs / imaging needed at time of follow-up: cbc, bmp  3.  Pending labs/ test needing follow-up: none  Follow-up Appointments: Follow-up Information    [name] [place], MD Follow up.   Specialties:  Vascular Surgery, Cardiology Why:  f/U 11/11/2017 for staple removal. Contact information: [place] [address] [place], Julie, NP Follow up in 2 week(s).   Specialty:  Nurse Practitioner Why:  Please follow up with your PCP in about 2 weeks Contact information: Back to [place] Visits [place]  [facility] Course by problem list:  PAD Lower extremity cellulitis S/P Bilateral AKA HFpEF  Patient arrived with worsening of his advanced peripheral arterial disease.  He had been followed by vascular surgery in the past, bilateral AKA's were recommended but at the time he had declined.  He came in for worsening pain and discomfort, additionally he felt ill had subjective fever and when interviewed was experiencing rigors.  He originally thought this may have been worsening gout however his pain seemed to be more  diffuse throughout the legs not associated with particular joint spaces.  Objectively on arrival he did not have fever or leukocytosis, there were no evidence of osteomyelitis on the x-rays of his feet.  He did have an elevation of his lactic acid but had no elevation of creatinine kinase.  His left leg had several ulcerations some of which appeared to be infected.  He had a small ulceration on the right leg as well.  He was placed on broad-spectrum antibiotics and he responded extremely well symptomatically.  His Rigors and subjective fevers dissipated.  During his treatment we were discussing the treatment options going forward he was still having trouble deciding.  There was some uncertainty and how much he understood about his condition.  We consulted our palliative care colleagues who spent considerable time and effort speaking with the patient and making sure he understood his current clinical situation.  Given that his functional status was already all wheelchair-bound prior to the amputations and the legs were causing the patient significant pain he ultimately decided he would like to proceed with bilateral AKA's.  We therefore consulted vascular surgery who performed the amputations.  The patient tolerated the procedure well had minimal post op pain.  He did have some hypotension which was not severe but unusual for the patient after the procedure, he was asymptomatic and his home blood pressure medications were held.  He was discharged to SNF with vascular surgery follow up.     Discharge Vitals:   BP 101/72 (BP Location: Right Arm)   Pulse 70   Temp 98.3 F (36.8 C) (Oral)   Resp 16   Ht 5\' 10"  (1.778 m)   Wt 246 lb 4.8 oz (111.7 kg)   SpO2 97%   BMI 35.34 kg/m   Pertinent Labs, Studies, and Procedures:  CBC Latest Ref Rng & Units 10/13/2017 10/12/2017 10/11/2017  WBC 4.0 - 10.5 K/uL 5.4 7.6 9.6  Hemoglobin 13.0 - 17.0 g/dL 11.1(L) 11.6(L) 11.1(L)  Hematocrit 39.0 - 52.0 % 35.1(L) 36.4(L)  34.8(L)  Platelets 150 - 400 K/uL 319 326 300   BMP Latest Ref Rng & Units 10/13/2017 10/12/2017 10/11/2017  Glucose 65 - 99 mg/dL 109(H) 117(H) 114(H)  BUN 6 - 20 mg/dL 31(H) 28(H) 30(H)  Creatinine 0.61 - 1.24 mg/dL 1.35(H) 1.29(H) 1.37(H)  Sodium 135 - 145 mmol/L 135 135 135  Potassium 3.5 - 5.1 mmol/L 4.1 4.1 3.9  Chloride 101 - 111 mmol/L 96(L) 96(L) 95(L)  CO2 22 - 32 mmol/L 33(H) 32 32  Calcium 8.9 - 10.3 mg/dL 8.6(L) 8.4(L) 8.5(L)   MRSA, PCR NEGATIVE NEGATIVE   Staphylococcus aureus NEGATIVE POSITIVEAbnormal    Comment: (NOTE)    Specimen Description BLOOD LEFT ANTECUBITAL   Special Requests BOTTLES DRAWN AEROBIC AND ANAEROBIC Blood Culture results may not be optimal due to an excessive volume of blood received in culture bottles    Culture NO GROWTH 5 DAYS  Performed at [facility], [place] [address] [place], [place] [address]     Report Status 10/09/2017 FINAL     Discharge Instructions: Discharge Instructions    Diet - low sodium heart healthy   Complete by:  As directed    Discharge instructions   Complete by:  As directed    Please have physical therapy and occupational therapy work with Mr. Bienvenue as often as possible.  Continue to monitor his volume status and adjust diuretic therapy.  He will require increased protein intake as he recovers from the surgery.  He has extensive plaque psoriasis and will require routine skin care.   Increase activity slowly   Complete by:  As directed       Signed: Katherine Roan, MD 10/13/2017, 3:00 PM

## 2017-10-04 NOTE — Progress Notes (Signed)
Pharmacy Antibiotic Note  [name] [name] is a 84 y.o. male admitted on 10/04/2017 with suspected diabetic foot infection.  Pharmacy has been consulted for vancomycin dosing.  Has hx of PAD and gout. R/L foot x-rays negative for signs of osteomyelitis. WBC WNL. Afebrile. LA 3.19. Scr 1.8 (normCrCl 28 mL/min). Received one time doses of zosyn and vancomycin in ED today.   Plan: Order vancomycin 1 gram to complete total loading dose of 2 grams Will start vancomycin 1500 mg IV every 48 hours  Monitor renal function, clinical pic, cx results, and VT prn  Height: 5\' 10"  (177.8 cm) Weight: 247 lb (112 kg) IBW/kg (Calculated) : 73  Temp (24hrs), Avg:98 F (36.7 C), Min:97.9 F (36.6 C), Max:98 F (36.7 C)  Recent Labs  Lab 10/04/17 1304 10/04/17 1419 10/04/17 1427 10/04/17 1428  WBC  --  6.1  --   --   CREATININE  --  1.99* 1.80*  --   LATICACIDVEN 2.52*  --   --  3.19*    Estimated Creatinine Clearance: 34.9 mL/min (A) (by C-G formula based on SCr of 1.8 mg/dL (H)).    No Known Allergies  Antimicrobials this admission: Ceftriaxone 6/9 >> Metronidazole 6/9>> Vancomycin 6/9 >>  Zosyn 6/9 x1  Dose adjustments this admission: N/A  Microbiology results: 6/9 BCx: sent 6/9 UCx: sent   Thank you for allowing pharmacy to be a part of this patient's care.  Doylene Canard, PharmD Clinical Pharmacist  Pager: [phone] Phone: [phone] 10/04/2017 6:54 PM

## 2017-10-04 NOTE — ED Notes (Signed)
Patient transported to X-ray 

## 2017-10-04 NOTE — ED Notes (Signed)
Attempted to draw second set of cultures, attempt unsuccessful RN notified.

## 2017-10-04 NOTE — ED Triage Notes (Signed)
Pt presents to ED for assessment of the wraps on his legs for circulation which he has a home health nurse come and change three times a week.  Also, main concern is gout to left ankle not improving.  Pt also c/o lump to his right armpit x 3 months.   Pt also c/o bed sores to his bottom.

## 2017-10-04 NOTE — ED Provider Notes (Signed)
[place] EMERGENCY DEPARTMENT Provider Note   CSN: [id number] Arrival date & time: 10/04/17  1152     History   Chief Complaint Chief Complaint  Patient presents with  . Multiple Complaints    HPI [name] [name] is a 84 y.o. male hx of CKD, DM, HTN, peripheral vascular disease with bilateral foot ulcers, here presenting with pain with his ulcers.  Patient states that he lives at home by himself.  He does have a visiting nurse that comes Monday Wednesday Friday to change his dressings.  His dressings were changed 2 days ago.  He states that he has progressive pain to his ulcers and worsening drainage for the last several days.  He also feels weak all over.  Of note, patient has known peripheral artery disease and was admitted to the hospital about a year ago for similar symptoms.  Vascular surgery saw patient and felt that patient is not a good vascular candidate and offered amputation but patient refused at that time.  Patient also states that he has sacral ulcers that is getting more painful.  No fevers at home.  The history is provided by the patient and a relative.    Past Medical History:  Diagnosis Date  . Chronic kidney disease    RENAL INSUFICCIENCY  . Diabetes mellitus without complication ([place])   . DVT (deep venous thrombosis) ([place])   . Gout   . Hypertension   . Lymphadenitis 08/10/2012  . Psoriasis     Patient Active Problem List   Diagnosis Date Noted  . Hyperkalemia 11/17/2016  . Venous stasis ulcer ([place]) 11/17/2016  . Venous stasis ulcer of lower extremity ([place]) 05/23/2015  . Cellulitis of both lower extremities 10/18/2014  . CKD (chronic kidney disease) stage 3, GFR 30-59 ml/min (H[facility]) 10/18/2014  . Hypertension 10/18/2014  . Chronic diastolic CHF (congestive heart failure) ([place]) 10/18/2014  . Lower extremity cellulitis 10/18/2014  . PAD (peripheral artery disease) ([place]) 02/02/2014  . Atherosclerosis of native arteries of the extremities with  ulceration ([place]) 07/22/2013  . Acute renal failure ([place]) 11/15/2012  . Fall 11/15/2012  . Diabetic ulcer of left foot ([place]) 10/15/2012  . Diabetes mellitus ([place]) 10/15/2012  . Lymphadenitis 08/07/2012  . Cellulitis 08/07/2012  . HTN (hypertension) 08/05/2012  . Renal insufficiency 08/05/2012  . Tobacco abuse 08/05/2012  . Gout 08/05/2012  . Hyperglycemia 08/05/2012    History reviewed. No pertinent surgical history.      Home Medications    Prior to Admission medications   Medication Sig Start Date End Date Taking? Authorizing Provider  allopurinol (ZYLOPRIM) 300 MG tablet Take 300 mg by mouth daily.    [provider]  Amino Acids-Protein Hydrolys (FEEDING SUPPLEMENT, PRO-STAT SUGAR FREE 64,) LIQD Take 30 mLs by mouth 2 (two) times daily. 11/20/16   Barton Dubois, MD  feeding supplement, ENSURE ENLIVE, (ENSURE ENLIVE) LIQD Take 237 mLs by mouth daily at 2 PM. 11/21/16   Barton Dubois, MD  furosemide (LASIX) 20 MG tablet Take 20 mg by mouth 3 (three) times daily.    [provider]  gabapentin (NEURONTIN) 100 MG capsule Take 3 capsules (300 mg total) by mouth 3 (three) times daily. 09/07/16 11/17/16  Maryan Puls, MD  losartan (COZAAR) 50 MG tablet Take 50 mg by mouth daily.    [provider]  traMADol (ULTRAM) 50 MG tablet Take 100 mg by mouth 2 (two) times daily. 11/17/16   [provider]  triamcinolone ointment (KENALOG) 0.1 % Apply  1 application topically 2 (two) times daily.    [provider]  verapamil (VERELAN) 100 MG 24 hr capsule Take 100 mg by mouth daily.     [provider]  Vitamin D, Ergocalciferol, (DRISDOL) 50000 units CAPS capsule Take 50,000 Units by mouth every 7 (seven) days.    [provider]    Family History Family History  Problem Relation Age of Onset  . Hypertension Mother   . Stroke Mother   . Hypertension Father     Social History Social History   Tobacco Use  . Smoking status:  Former Smoker    Years: 40.00    Types: Pipe  . Smokeless tobacco: Never Used  Substance Use Topics  . Alcohol use: No  . Drug use: No     Allergies   Patient has no known allergies.   Review of Systems Review of Systems  Skin: Positive for color change.  All other systems reviewed and are negative.    Physical Exam Updated Vital Signs BP 98/77 (BP Location: Right Arm)   Pulse (!) 51   Temp 97.9 F (36.6 C) (Oral)   Resp (!) 26   Ht 5\' 10"  (1.778 m)   Wt 112 kg (247 lb)   SpO2 100%   BMI 35.44 kg/m   Physical Exam  Constitutional:  Chronically ill, dehydrated, crying in pain   HENT:  Head: Normocephalic.  MM dry   Eyes: Pupils are equal, round, and reactive to light. Conjunctivae and EOM are normal.  Neck: Normal range of motion. Neck supple.  Cardiovascular: Normal rate, regular rhythm and normal heart sounds.  Pulmonary/Chest: Effort normal and breath sounds normal. No stridor. No respiratory distress. He has no wheezes.  Abdominal: Soft. Bowel sounds are normal. He exhibits no distension. There is no tenderness. There is no guarding.  Musculoskeletal:  Stage 2 sacral decub ulcer. There is bilateral posterior calf stage 3 ulcers with obvious purulent drainage. Thready DP pulses bilateral feet (baseline per previous notes)   Neurological: He is alert.  Skin: There is erythema.  Psychiatric: He has a normal mood and affect.  Nursing note and vitals reviewed.    ED Treatments / Results  Labs (all labs ordered are listed, but only abnormal results are displayed) Labs Reviewed  I-STAT CG4 LACTIC ACID, ED - Abnormal; Notable for the following components:      Result Value   Lactic Acid, Venous 2.52 (*)    All other components within normal limits  CULTURE, BLOOD (ROUTINE X 2)  CULTURE, BLOOD (ROUTINE X 2)  URINE CULTURE  CBC  BASIC METABOLIC PANEL  URINALYSIS, ROUTINE W REFLEX MICROSCOPIC  I-STAT CHEM 8, ED    EKG None  Radiology No results  found.  Procedures Procedures (including critical care time)  Medications Ordered in ED Medications  sodium chloride 0.9 % bolus 1,000 mL (has no administration in time range)  vancomycin (VANCOCIN) IVPB 1000 mg/200 mL premix (has no administration in time range)  piperacillin-tazobactam (ZOSYN) IVPB 3.375 g (has no administration in time range)     Initial Impression / Assessment and Plan / ED Course  I have reviewed the triage vital signs and the nursing notes.  Pertinent labs & imaging results that were available during my care of the patient were reviewed by me and considered in my medical decision making (see chart for details).    [name] [name] is a 84 y.o. male here with bilateral leg ulcers, weakness, severe pain. He has hx  of PAD and was seen by vascular and recommend amputation. However, patient refused at that time and rather elected to have wound care at home. He likely has worsening ulcers from PVD and now the ulcers appears to be infected. Will get xrays to r/o osteo. Will do sepsis workup and order IV abx. Will need admission.   4:02 PM WBC nl. Lactate elevated at 3. xrays showed no osteo, likely just cellulitis from infected ulcers. I offered vascular eval for amputation but patient refused and rather be treated medically. Will admit for IV abx.    Final Clinical Impressions(s) / ED Diagnoses   Final diagnoses:  None    ED Discharge Orders    None       Drenda Freeze, MD 10/04/17 [phone]

## 2017-10-05 DIAGNOSIS — Z515 Encounter for palliative care: Secondary | ICD-10-CM

## 2017-10-05 DIAGNOSIS — L89890 Pressure ulcer of other site, unstageable: Secondary | ICD-10-CM

## 2017-10-05 DIAGNOSIS — Z7189 Other specified counseling: Secondary | ICD-10-CM

## 2017-10-05 DIAGNOSIS — N183 Chronic kidney disease, stage 3 (moderate): Secondary | ICD-10-CM

## 2017-10-05 DIAGNOSIS — I130 Hypertensive heart and chronic kidney disease with heart failure and stage 1 through stage 4 chronic kidney disease, or unspecified chronic kidney disease: Secondary | ICD-10-CM

## 2017-10-05 DIAGNOSIS — M1A9XX0 Chronic gout, unspecified, without tophus (tophi): Secondary | ICD-10-CM

## 2017-10-05 DIAGNOSIS — I739 Peripheral vascular disease, unspecified: Secondary | ICD-10-CM

## 2017-10-05 LAB — CBC
HCT: 34.5 % — ABNORMAL LOW (ref 39.0–52.0)
Hemoglobin: 11 g/dL — ABNORMAL LOW (ref 13.0–17.0)
MCH: 30.4 pg (ref 26.0–34.0)
MCHC: 31.9 g/dL (ref 30.0–36.0)
MCV: 95.3 fL (ref 78.0–100.0)
PLATELETS: 304 10*3/uL (ref 150–400)
RBC: 3.62 MIL/uL — AB (ref 4.22–5.81)
RDW: 13.4 % (ref 11.5–15.5)
WBC: 6.8 10*3/uL (ref 4.0–10.5)

## 2017-10-05 LAB — GLUCOSE, CAPILLARY
GLUCOSE-CAPILLARY: 111 mg/dL — AB (ref 65–99)
GLUCOSE-CAPILLARY: 136 mg/dL — AB (ref 65–99)
Glucose-Capillary: 129 mg/dL — ABNORMAL HIGH (ref 65–99)
Glucose-Capillary: 144 mg/dL — ABNORMAL HIGH (ref 65–99)

## 2017-10-05 LAB — URINE CULTURE: Culture: NO GROWTH

## 2017-10-05 LAB — CK: Total CK: 143 U/L (ref 49–397)

## 2017-10-05 LAB — COMPREHENSIVE METABOLIC PANEL
ALT: 15 U/L — AB (ref 17–63)
AST: 32 U/L (ref 15–41)
Albumin: 2.3 g/dL — ABNORMAL LOW (ref 3.5–5.0)
Alkaline Phosphatase: 47 U/L (ref 38–126)
Anion gap: 7 (ref 5–15)
BUN: 49 mg/dL — ABNORMAL HIGH (ref 6–20)
CALCIUM: 8.4 mg/dL — AB (ref 8.9–10.3)
CHLORIDE: 103 mmol/L (ref 101–111)
CO2: 27 mmol/L (ref 22–32)
CREATININE: 1.59 mg/dL — AB (ref 0.61–1.24)
GFR, EST AFRICAN AMERICAN: 43 mL/min — AB (ref >60)
GFR, EST NON AFRICAN AMERICAN: 37 mL/min — AB (ref >60)
Glucose, Bld: 113 mg/dL — ABNORMAL HIGH (ref 65–99)
Potassium: 3.9 mmol/L (ref 3.5–5.1)
Sodium: 137 mmol/L (ref 135–145)
Total Bilirubin: 0.8 mg/dL (ref 0.3–1.2)
Total Protein: 6.9 g/dL (ref 6.5–8.1)

## 2017-10-05 LAB — HEMOGLOBIN A1C
HEMOGLOBIN A1C: 6.7 % — AB (ref 4.8–5.6)
MEAN PLASMA GLUCOSE: 145.59 mg/dL

## 2017-10-05 LAB — URIC ACID: Uric Acid, Serum: 8 mg/dL — ABNORMAL HIGH (ref 4.4–7.6)

## 2017-10-05 LAB — LACTIC ACID, PLASMA: Lactic Acid, Venous: 1.8 mmol/L (ref 0.5–1.9)

## 2017-10-05 MED ORDER — JUVEN PO PACK
1.0000 | PACK | Freq: Two times a day (BID) | ORAL | Status: DC
  Administered 2017-10-05 – 2017-10-13 (×13): 1 via ORAL
  Filled 2017-10-05 (×17): qty 1

## 2017-10-05 MED ORDER — FUROSEMIDE 40 MG PO TABS
20.0000 mg | ORAL_TABLET | Freq: Every day | ORAL | Status: DC
  Administered 2017-10-06: 20 mg via ORAL
  Filled 2017-10-05: qty 1

## 2017-10-05 MED ORDER — COLCHICINE 0.6 MG PO TABS
0.6000 mg | ORAL_TABLET | Freq: Every day | ORAL | Status: DC
  Administered 2017-10-05 – 2017-10-13 (×8): 0.6 mg via ORAL
  Filled 2017-10-05 (×9): qty 1

## 2017-10-05 MED ORDER — ALLOPURINOL 300 MG PO TABS
300.0000 mg | ORAL_TABLET | Freq: Every day | ORAL | Status: DC
  Administered 2017-10-05 – 2017-10-13 (×9): 300 mg via ORAL
  Filled 2017-10-05 (×9): qty 1

## 2017-10-05 MED ORDER — INSULIN ASPART 100 UNIT/ML ~~LOC~~ SOLN
0.0000 [IU] | Freq: Three times a day (TID) | SUBCUTANEOUS | Status: DC
  Administered 2017-10-05 (×2): 1 [IU] via SUBCUTANEOUS

## 2017-10-05 MED ORDER — PREMIER PROTEIN SHAKE
11.0000 [oz_av] | ORAL | Status: DC
  Administered 2017-10-05 – 2017-10-10 (×3): 11 [oz_av] via ORAL
  Filled 2017-10-05 (×12): qty 325.31

## 2017-10-05 NOTE — Consult Note (Signed)
[facility] Nurse wound consult note Reason for Consult:peripheral vascular disease, cellulitis and pain to bilateral lower extremities.  Wound type:vascular/neuropathic.  Amputations recommended and refused. Currently on Vancomycin for infection. Pending consult with family per MD note 10/04/17.  Increasing pain. Unclear if this is from gout or worsening vascular disease. Uric acid is 8.0 this AM.   Will not resume compression at this time.  Pressure Injury POA: Yes Measurement:Left plantar foot: 1.4 cm x 2 cm x 0.2 cm    Left dorsal foot, second metatarsal base:  1 cm x 1 cm x 0.2 cm  Left posterior leg:  3 cm x 3 cm x 0.2 cm  Wound KLK:JZPHX red Drainage (amount, consistency, odor) minimal serosanguinous.  Necrotic odor Periwound: Chronic skin changes Dressing procedure/placement/frequency:Cleanse bilateral legs with soap and water.  Apply Aquacel Ag to nonintact lesions on lower legs.  Secure with kerlix and tape.  Change Mon and Thursday.  Will not follow at this time.  Please re-consult if needed.  Domenic Moras RN BSN [place] Pager [phone]

## 2017-10-05 NOTE — Progress Notes (Addendum)
Subjective: Pt appeared comfortable today and had no complaints.  We spoke with him about his PAD.   Objective:  Vital signs in last 24 hours: Vitals:   10/04/17 2236 10/05/17 0459 10/05/17 0951 10/05/17 1549  BP: (!) 91/53 109/64 122/73 112/66  Pulse: 96 90 77 90  Resp: 20 20 18 16   Temp: 98.5 F (36.9 C) 98.5 F (36.9 C) 98.9 F (37.2 C) 97.7 F (36.5 C)  TempSrc: Oral Oral  Oral  SpO2: 100% 99% 99% 98%  Weight: 216 lb 7.9 oz (98.2 kg)     Height:       Physical Exam  Constitutional: He appears well-developed and well-nourished.  Cardiovascular: Normal rate, regular rhythm, normal heart sounds and intact distal pulses. Exam reveals no gallop and no friction rub.  No murmur heard. Pulses:      Dorsalis pedis pulses are 0 on the right side, and 0 on the left side.       Posterior tibial pulses are 0 on the right side, and 0 on the left side.  Pulmonary/Chest: Effort normal. No respiratory distress. He has no wheezes. He has rales in the right lower field.  Abdominal: Soft. Bowel sounds are normal. He exhibits no distension and no mass. There is no tenderness. There is no guarding.  Musculoskeletal: He exhibits edema (2+ pitting edema improved from day prior).  Neurological: He is alert.  Skin:  There are psoriatic plaques seen throughout the surface of the skin in a patchy distribution, worse on the anterior surface of the lower extremities.  The lower extremities from the knee down have corresponding patchy pink granulation tissue.  There are several ulcerations on the left leg>right leg.  Additionally there is a stage II sacral decubitus ulcer and some mild skin breakdown on the anterior scrotum.       Assessment/Plan:  Active Problems:   Cellulitis   PAD (peripheral artery disease) (H[facility])   Chronic renal insufficiency, stage 3 (moderate) (HCC)   HTN (hypertension), benign   Pressure injury of skin  Ulcerations of bilateral LE with surrounding cellulitis secondary  to PAD: progressive process secondary to T2DM, hyperlipidemia.  Left leg worse than right.  I am unsure if he understands the implications of not receiving amputation.     -appreciate palliative medicine's assistance -wound care saw pt appreciate assistance -resumed home tramadol for pain control required only one dose on hospital day 1 for pain -will likely d/c abx tomorrow -as it looks like there will be no surgical intervention will start pt on aspirin tomorrow -PT to evaluate   HFpEF: last ECHO 2014 showed G2DD with normal EF.  Does not appear volume overloaded on exam other than LE edema which is chronic. He is on lasix 20mg  TID at home.     -does not appear volume overloaded will start a smaller 20mg  dose of lasix in the am   T2DM: pt states that he is borderline diabetic he is currently on no therapy for diabetes last known a1c 6.6 in 2016. A1C on admission 6.7   -will monitor for now does not appear to need correctional insulin   Gout: this was the patient's chief complaint, It is difficult to ascertain by exam whether his pain is from gout vs worsening vascular disease.  He takes allopurinol 300mg  daily and colchicine 0.6mg  daily.    -Uric acid elevated will continue allopurinol and colchicine     CKD 3: cr baseline appears to be about 1.5 pt is on  losartan 25mg  daily   -will continue to monitor, improved with fluids and holding lasix/losartan     Dispo: Anticipated discharge in approximately 1-2 day(s).   Katherine Roan, MD 10/05/2017, 7:06 PM Vickki Muff MD PGY-1 Internal Medicine Pager # [phone]

## 2017-10-05 NOTE — Progress Notes (Signed)
Initial Nutrition Assessment  DOCUMENTATION CODES:   Obesity unspecified  INTERVENTION:  - Will order Juven BID, each packet provides 80 kcal and 14 grams of amino acids. - Will order Premier Protein once/day, this supplement provides 160 kcal and 30 grams of protein.  - Continue to encourage PO intakes.   NUTRITION DIAGNOSIS:   Increased nutrient needs related to wound healing as evidenced by estimated needs.  GOAL:   Patient will meet greater than or equal to 90% of their needs  MONITOR:   PO intake, Supplement acceptance, Weight trends, Labs, Skin  REASON FOR ASSESSMENT:   Consult Wound healing  ASSESSMENT:   84 yo male with PMH of PAD, T2DM, HFpEF, gout. He says he is here to address his worsening pain from gout; seems like pain is mainly occurring in his left ankle. He has had extensive vascular workup in the past and consultation and recommendation had been made for bilateral amputation but the patient refused at that time. He is still receiving Unna boot wraps 3x per week.   No intakes documented. Patient reports that for breakfast he consumed all of his meal which consisted of pancakes, bacon, eggs, and coffee. He states that PTA he had a good appetite and no issues with eating, but that he had difficulty getting to the kitchen in order to get something to eat d/t pain. At home he has a walker, a wheelchair, and a scooter to aid in getting around. He denies abdominal pain or nausea at this time. Talked with patient about oral nutrition supplements and he is agreeable. No family/visitors present during RD visit.  Per chart review, he has lost 10 lbs (4.4% body weight) in the past 11 months. This is not significant for time frame.   Medications reviewed; sliding scale Novolog, daily multivitamin with minerals. Labs reviewed; BUN: 49 mg/L, creatinine: 1.59 mg/dL, Ca: 8.4 mg/dL, GFR: 43 mL/min.        NUTRITION - FOCUSED PHYSICAL EXAM:  Completed; no muscle and no fat  wasting noted; mild to moderate edema to BLE  Diet Order:   Diet Order           Diet Carb Modified Fluid consistency: Thin; Room service appropriate? Yes  Diet effective now          EDUCATION NEEDS:   No education needs have been identified at this time  Skin:  Skin Assessment: Skin Integrity Issues: Skin Integrity Issues:: Stage II, Other (Comment) Stage II: sacrum Other: vascular/neuropathic wounds (3) to L foot and leg  Last BM:  PTA/unknown  Height:   Ht Readings from Last 1 Encounters:  10/04/17 5\' 10"  (1.778 m)    Weight:   Wt Readings from Last 1 Encounters:  10/04/17 216 lb 7.9 oz (98.2 kg)    Ideal Body Weight:  75.45 kg  BMI:  Body mass index is 31.06 kg/m.  Estimated Nutritional Needs:   Kcal:  2956-2130 (18-20 kcal/kg)  Protein:  100-115 grams  Fluid:  >/= 1.8 L/day      Jarome Matin, MS, RD, LDN, [facility] Inpatient Clinical Dietitian Pager # [phone] After hours/weekend pager # [phone]

## 2017-10-05 NOTE — Consult Note (Addendum)
Consultation Note Date: 10/05/2017   Patient Name: [name] [name]  DOB: 1926-09-09  MRN: [id number]  Age / Sex: 84 y.o., male  PCP: Alvester Chou, NP Referring Physician: Annia Belt, MD  Reason for Consultation: Establishing goals of care  HPI/Patient Profile: Patient presented here to address his worsening pain from gout.  It is difficult to discern where exactly he feels this pain seems like it may be the left ankle.  He has had extensive vascular workup in the past and consultation is followed by Dr. Bridgett Larsson, bilateral amputation was recommended but the patient refused at that time.    Clinical Assessment and Goals of Care: Patient is resting in bed he is oriented to person, place, time ,event, birth date, and president. He states he lives alone. He has an aide that come into the home 2 hours per days and helps with bathing and meal prep. He states he is not married and has 2 children. He states he has not talked with either of them in about 30 years. 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 He uses a wheelchair and can stand to get into and out of wheelchair.   We discussed his diagnosis, prognosis, and GOC. The difference between an aggressive medical intervention path and a hospice comfort care path was discussed. He states doctors have been telling him that he needs his legs amputated, that it's nothing new to him. He states if he has the procedure it will leave him unable to walk. We discussed hospice philosophy, he states "I don't have death wish, I'm not suicidal". He states he wants help to get well enough to go home. If he wants the amputations he will return to the hospital. Discussed the seriousness of the situation. He tells me he is aware he could die from not having the procedure, but God is always on time, and he is not ready, he still has things to get in place at home. He wishes to  remain a full code stating God is in control.   He does not give information for a surrogate decision maker, and does not want anyone called.       SUMMARY OF RECOMMENDATIONS    Continue current care. Full code status.   Recommend home with palliative.   Code Status/Advance Care Planning:  Full code    Symptom Management:   Per primary team.  Palliative Prophylaxis:   Eye Care and Oral Care  Prognosis:   Poor long term. Cellulitis and wounds to lower legs.   Discharge Planning: Home with Palliative Services      Primary Diagnoses: Present on Admission: . Cellulitis . PAD (peripheral artery disease) ([place])   I have reviewed the medical record, interviewed the patient and family, and examined the patient. The following aspects are pertinent.  Past Medical History:  Diagnosis Date  . Chronic kidney disease    RENAL INSUFICCIENCY  . Diabetes mellitus without complication ([place])   . DVT (deep venous thrombosis) ([place])   . Gout   .  Hypertension   . Lymphadenitis 08/10/2012  . Psoriasis    Social History   Socioeconomic History  . Marital status: Widowed    Spouse name: Not on file  . Number of children: Not on file  . Years of education: Not on file  . Highest education level: Not on file  Occupational History  . Not on file  Social Needs  . Financial resource strain: Not on file  . Food insecurity:    Worry: Not on file    Inability: Not on file  . Transportation needs:    Medical: Not on file    Non-medical: Not on file  Tobacco Use  . Smoking status: Former Smoker    Years: 40.00    Types: Pipe  . Smokeless tobacco: Never Used  Substance and Sexual Activity  . Alcohol use: No  . Drug use: No  . Sexual activity: Not on file  Lifestyle  . Physical activity:    Days per week: Not on file    Minutes per session: Not on file  . Stress: Not on file  Relationships  . Social connections:    Talks on phone: Not on file    Gets together: Not on  file    Attends religious service: Not on file    Active member of club or organization: Not on file    Attends meetings of clubs or organizations: Not on file    Relationship status: Not on file  Other Topics Concern  . Not on file  Social History Narrative  . Not on file   Family History  Problem Relation Age of Onset  . Hypertension Mother   . Stroke Mother   . Hypertension Father    Scheduled Meds: . gabapentin  100 mg Oral BID BM  . gabapentin  300 mg Oral QHS  . heparin  5,000 Units Subcutaneous Q8H  . insulin aspart  0-9 Units Subcutaneous TID WC  . metroNIDAZOLE  500 mg Oral Q8H  . multivitamin with minerals  1 tablet Oral Daily  . nutrition supplement (JUVEN)  1 packet Oral BID BM  . protein supplement shake  11 oz Oral Q24H  . verapamil  180 mg Oral Daily   Continuous Infusions: . cefTRIAXone (ROCEPHIN)  IV Stopped (10/04/17 2314)  . [START ON 10/06/2017] vancomycin     PRN Meds:.traMADol Medications Prior to Admission:  Prior to Admission medications   Medication Sig Start Date End Date Taking? Authorizing Provider  allopurinol (ZYLOPRIM) 300 MG tablet Take 300 mg by mouth daily.   Yes [provider]  colchicine 0.6 MG tablet Take 0.6 mg by mouth daily.   Yes [provider]  feeding supplement, ENSURE ENLIVE, (ENSURE ENLIVE) LIQD Take 237 mLs by mouth daily at 2 PM. 11/21/16  Yes Barton Dubois, MD  furosemide (LASIX) 20 MG tablet Take 20 mg by mouth 3 (three) times daily.   Yes [provider]  gabapentin (NEURONTIN) 100 MG capsule Take 100 mg by mouth See admin instructions. Patient takes 1 Capsule in the Morning, 1 Capsule every evening and 3 Capsules at Bedtime.   Yes [provider]  losartan (COZAAR) 25 MG tablet Take 25 mg by mouth daily.   Yes [provider]  traMADol (ULTRAM) 50 MG tablet Take 100 mg by mouth 3 (three) times daily as needed for moderate pain.  11/17/16  Yes [provider]  verapamil  (VERELAN PM) 180 MG 24 hr capsule Take 180 mg by mouth daily.  Yes [provider]  Amino Acids-Protein Hydrolys (FEEDING SUPPLEMENT, PRO-STAT SUGAR FREE 64,) LIQD Take 30 mLs by mouth 2 (two) times daily. 11/20/16   Barton Dubois, MD  triamcinolone ointment (KENALOG) 0.1 % Apply 1 application topically 2 (two) times daily.    [provider]  Vitamin D, Ergocalciferol, (DRISDOL) 50000 units CAPS capsule Take 50,000 Units by mouth every 7 (seven) days.    [provider]   No Known Allergies Review of Systems  Musculoskeletal:       Leg pain. Pain is currently controlled.     Physical Exam  Constitutional: He is oriented to person, place, and time. No distress.  Pulmonary/Chest: Effort normal.  Neurological: He is alert and oriented to person, place, and time.  Skin: Skin is warm and dry.    Vital Signs: BP 122/73 (BP Location: Right Arm)   Pulse 77   Temp 98.9 F (37.2 C)   Resp 18   Ht 5\' 10"  (1.778 m)   Wt 98.2 kg (216 lb 7.9 oz)   SpO2 99%   BMI 31.06 kg/m  Pain Scale: 0-10   Pain Score: 0-No pain   SpO2: SpO2: 99 % O2 Device:SpO2: 99 % O2 Flow Rate: .   IO: Intake/output summary:   Intake/Output Summary (Last 24 hours) at 10/05/2017 1447 Last data filed at 10/05/2017 1400 Gross per 24 hour  Intake 2325 ml  Output 1950 ml  Net 375 ml    LBM: Last BM Date: (UTA, pt unsure) Baseline Weight: Weight: 112 kg (247 lb) Most recent weight: Weight: 98.2 kg (216 lb 7.9 oz)     Palliative Assessment/Data: 60%      Time In: 2:00 Time Out: 2:55 Time Total: 50 min Greater than 50%  of this time was spent counseling and coordinating care related to the above assessment and plan.  Signed by: Asencion Gowda, NP   Please contact Palliative Medicine Team phone at [phone] for questions and concerns.  For individual provider: See Shea Evans

## 2017-10-06 DIAGNOSIS — I70238 Atherosclerosis of native arteries of right leg with ulceration of other part of lower right leg: Secondary | ICD-10-CM

## 2017-10-06 DIAGNOSIS — I5030 Unspecified diastolic (congestive) heart failure: Secondary | ICD-10-CM

## 2017-10-06 DIAGNOSIS — I70248 Atherosclerosis of native arteries of left leg with ulceration of other part of lower left leg: Secondary | ICD-10-CM

## 2017-10-06 DIAGNOSIS — L03119 Cellulitis of unspecified part of limb: Secondary | ICD-10-CM

## 2017-10-06 LAB — BASIC METABOLIC PANEL
Anion gap: 12 (ref 5–15)
BUN: 30 mg/dL — AB (ref 6–20)
CALCIUM: 8.9 mg/dL (ref 8.9–10.3)
CO2: 26 mmol/L (ref 22–32)
Chloride: 100 mmol/L — ABNORMAL LOW (ref 101–111)
Creatinine, Ser: 1.49 mg/dL — ABNORMAL HIGH (ref 0.61–1.24)
GFR calc Af Amer: 46 mL/min — ABNORMAL LOW (ref >60)
GFR, EST NON AFRICAN AMERICAN: 40 mL/min — AB (ref >60)
GLUCOSE: 93 mg/dL (ref 65–99)
Potassium: 4 mmol/L (ref 3.5–5.1)
SODIUM: 138 mmol/L (ref 135–145)

## 2017-10-06 LAB — CBC
HCT: 39.3 % (ref 39.0–52.0)
Hemoglobin: 12.3 g/dL — ABNORMAL LOW (ref 13.0–17.0)
MCH: 30.2 pg (ref 26.0–34.0)
MCHC: 31.3 g/dL (ref 30.0–36.0)
MCV: 96.6 fL (ref 78.0–100.0)
PLATELETS: 293 10*3/uL (ref 150–400)
RBC: 4.07 MIL/uL — AB (ref 4.22–5.81)
RDW: 13.8 % (ref 11.5–15.5)
WBC: 6.3 10*3/uL (ref 4.0–10.5)

## 2017-10-06 LAB — GLUCOSE, CAPILLARY
GLUCOSE-CAPILLARY: 93 mg/dL (ref 65–99)
Glucose-Capillary: 104 mg/dL — ABNORMAL HIGH (ref 65–99)
Glucose-Capillary: 112 mg/dL — ABNORMAL HIGH (ref 65–99)

## 2017-10-06 MED ORDER — FUROSEMIDE 40 MG PO TABS: 40.0000 mg | ORAL_TABLET | Freq: Every day | ORAL | Status: DC

## 2017-10-06 MED ORDER — FUROSEMIDE 40 MG PO TABS
40.0000 mg | ORAL_TABLET | Freq: Every day | ORAL | Status: DC
  Administered 2017-10-06 – 2017-10-07 (×2): 40 mg via ORAL
  Filled 2017-10-06 (×2): qty 1

## 2017-10-06 NOTE — Consult Note (Addendum)
Hospital Consult VASCULAR SURGERY ASSESSMENT & PLAN:   COMBINED PERIPHERAL VASCULAR DISEASE AND CHRONIC VENOUS INSUFFICIENCY: This patient has chronic wounds of both lower extremities secondary to infrainguinal arterial occlusive disease and chronic venous insufficiency.  He also has significant gout and extensive thick plaques of psoriasis in both legs.  He is 84 years old with some underlying dementia.  He is previously been seen in our office by Dr. Adele Barthel.  I would agree with Dr. Bridgett Larsson that he is not a good candidate for an aggressive approach which would include arteriography and possible revascularization.  I have left a message with his niece to discuss his care.  I think the best options would be continued conservative treatment as currently the wounds are not making him septic versus primary above-the-knee amputations.   [name], MD, [place] [phone] Office: [phone]    Reason for Consult:  Leg wounds/amputations Requesting Physician:  [name], MD MRN #:  [id number]  History of Present Illness: This is a 84 y.o. male who has been seen in the past by Vascular Surgery.  He was last seen in July 2018 for bilateral leg wounds with mixed venous stasis ulcers mixed with severe peripheral arterial disease.  At the time, Dr. Oneida Alar had discussed amputation vs long term wound care and at the time, he went with wound care.    He was admitted to the hospital on 10/04/17 with increasing pain in his legs.  He says once admitted and with some medicine, his pain is not as bad as it was.  He states that he gets pains in the bottom of his feet that feels like a knife stabbing him.  When asked if hanging his legs down helps his pain, he says maybe for a little while.  He states that he lives at home alone and has a Marine scientist that comes in.  He doesn't walk due to the pain.  PT worked with him this morning and he was unable to stand.   He would like to have artificial legs after amputations.     Palliative care has been involved with the pt's care.    He is on medications for gout.  He is not on antiplatelet or statin.  He was on an ARB prior to admission.  Diabetes on chart, but pt denies and not on any medications for this.  Past Medical History:  Diagnosis Date  . Chronic kidney disease    RENAL INSUFICCIENCY  . Diabetes mellitus without complication ([place])   . DVT (deep venous thrombosis) ([place])   . Gout   . Hypertension   . Lymphadenitis 08/10/2012  . Psoriasis     History reviewed. No pertinent surgical history.  No Known Allergies  Prior to Admission medications   Medication Sig Start Date End Date Taking? Authorizing Provider  allopurinol (ZYLOPRIM) 300 MG tablet Take 300 mg by mouth daily.   Yes [provider]  colchicine 0.6 MG tablet Take 0.6 mg by mouth daily.   Yes [provider]  feeding supplement, ENSURE ENLIVE, (ENSURE ENLIVE) LIQD Take 237 mLs by mouth daily at 2 PM. 11/21/16  Yes Barton Dubois, MD  furosemide (LASIX) 20 MG tablet Take 20 mg by mouth 3 (three) times daily.   Yes [provider]  gabapentin (NEURONTIN) 100 MG capsule Take 100 mg by mouth See admin instructions. Patient takes 1 Capsule in the Morning, 1 Capsule every evening and 3 Capsules at Bedtime.   Yes [provider]  losartan (  COZAAR) 25 MG tablet Take 25 mg by mouth daily.   Yes [provider]  traMADol (ULTRAM) 50 MG tablet Take 100 mg by mouth 3 (three) times daily as needed for moderate pain.  11/17/16  Yes [provider]  verapamil (VERELAN PM) 180 MG 24 hr capsule Take 180 mg by mouth daily.   Yes [provider]  Amino Acids-Protein Hydrolys (FEEDING SUPPLEMENT, PRO-STAT SUGAR FREE 64,) LIQD Take 30 mLs by mouth 2 (two) times daily. 11/20/16   Barton Dubois, MD  triamcinolone ointment (KENALOG) 0.1 % Apply 1 application topically 2 (two) times daily.    [provider]  Vitamin D, Ergocalciferol,  (DRISDOL) 50000 units CAPS capsule Take 50,000 Units by mouth every 7 (seven) days.    [provider]    Social History   Socioeconomic History  . Marital status: Widowed    Spouse name: Not on file  . Number of children: Not on file  . Years of education: Not on file  . Highest education level: Not on file  Occupational History  . Not on file  Social Needs  . Financial resource strain: Not on file  . Food insecurity:    Worry: Not on file    Inability: Not on file  . Transportation needs:    Medical: Not on file    Non-medical: Not on file  Tobacco Use  . Smoking status: Former Smoker    Years: 40.00    Types: Pipe  . Smokeless tobacco: Never Used  Substance and Sexual Activity  . Alcohol use: No  . Drug use: No  . Sexual activity: Not on file  Lifestyle  . Physical activity:    Days per week: Not on file    Minutes per session: Not on file  . Stress: Not on file  Relationships  . Social connections:    Talks on phone: Not on file    Gets together: Not on file    Attends religious service: Not on file    Active member of club or organization: Not on file    Attends meetings of clubs or organizations: Not on file    Relationship status: Not on file  . Intimate partner violence:    Fear of current or ex partner: Not on file    Emotionally abused: Not on file    Physically abused: Not on file    Forced sexual activity: Not on file  Other Topics Concern  . Not on file  Social History Narrative  . Not on file     Family History  Problem Relation Age of Onset  . Hypertension Mother   . Stroke Mother   . Hypertension Father     ROS: [x]  Positive   [ ]  Negative   [ ]  All sytems reviewed and are negative  Cardiac: []  chest pain/pressure []  palpitations []  SOB lying flat []  DOE  Vascular: []  pain in legs while walking [x]  pain in legs at rest []  pain in legs at night []  non-healing ulcers []  hx of DVT []  swelling in legs  Pulmonary: []   productive cough []  asthma/wheezing []  home O2  Neurologic: []  weakness in []  arms []  legs []  numbness in []  arms []  legs []  hx of CVA []  mini stroke [] difficulty speaking or slurred speech []  temporary loss of vision in one eye []  dizziness  Hematologic: []  hx of cancer []  bleeding problems []  problems with blood clotting easily  Endocrine:   []  diabetes []   thyroid disease  GI []  vomiting blood []  blood in stool  GU: [x]  CKD/renal failure []  HD--[]  M/W/F or []  T/T/S []  burning with urination []  blood in urine  Psychiatric: []  anxiety []  depression  Musculoskeletal: []  arthritis []  joint pain [x]  gout  Integumentary: []  rashes [x]  ulcers bilateral legs  Constitutional: []  fever []  chills   Physical Examination  Vitals:   10/06/17 1000 10/06/17 1101  BP: 131/67 (!) 103/55  Pulse: 76   Resp: 18   Temp: 98 F (36.7 C)   SpO2: 98%    Body mass index is 31.09 kg/m.  General:  WDWN in NAD Gait: Not observed HENT: WNL, normocephalic Pulmonary: normal non-labored breathing, without Rales, rhonchi,  wheezing Cardiac: regular, without  Murmurs, rubs or gallops; without carotid bruits Abdomen:  soft, NT/ND, no masses Skin: without rashes Vascular Exam/Pulses:  Right Left  Radial Unable to palpate  1+ (weak)  Ulnar Unable to palpate  Unable to palpate   Popliteal Unable to palpate  Unable to palpate   DP Unable to palpate  Unable to palpate   PT Unable to palpate  Unable to palpate    Extremities: with ischemic changes/ulcerations bilaterally; sensation in tact bilateral feet. Minimal motor in tact. Musculoskeletal: no muscle wasting or atrophy  Neurologic: A&O X 3;  No focal weakness or paresthesias are detected; speech is fluent/normal Psychiatric:  The pt has Normal affect.   CBC    Component Value Date/Time   WBC 6.8 10/05/2017 0434   RBC 3.62 (L) 10/05/2017 0434   HGB 11.0 (L) 10/05/2017 0434   HCT 34.5 (L) 10/05/2017 0434   PLT  304 10/05/2017 0434   MCV 95.3 10/05/2017 0434   MCH 30.4 10/05/2017 0434   MCHC 31.9 10/05/2017 0434   RDW 13.4 10/05/2017 0434   LYMPHSABS 1.0 11/18/2016 0457   MONOABS 0.7 11/18/2016 0457   EOSABS 0.1 11/18/2016 0457   BASOSABS 0.0 11/18/2016 0457    BMET    Component Value Date/Time   NA 137 10/05/2017 0434   K 3.9 10/05/2017 0434   CL 103 10/05/2017 0434   CO2 27 10/05/2017 0434   GLUCOSE 113 (H) 10/05/2017 0434   BUN 49 (H) 10/05/2017 0434   CREATININE 1.59 (H) 10/05/2017 0434   CALCIUM 8.4 (L) 10/05/2017 0434   GFRNONAA 37 (L) 10/05/2017 0434   GFRAA 43 (L) 10/05/2017 0434    COAGS: Lab Results  Component Value Date   INR 1.03 10/30/2009     Non-Invasive Vascular Imaging:   Left foot xray 10/04/17: IMPRESSION: 1. Severe soft tissue swelling. No soft tissue gas, discrete soft tissue wound. 2. No radiographic evidence of osteomyelitis or acute osseous abnormality identified. 3. Calcified peripheral vascular disease.  Left fibula/tibia xray 10/04/17: IMPRESSION: 1. Soft tissue swelling and skin thickening.  No soft tissue gas. 2. No radiographic evidence of osteomyelitis or acute osseous abnormality identified.  Right foot xray 10/04/17: IMPRESSION: 1. Soft tissue swelling and skin thickening.  No soft tissue gas. 2. No radiographic evidence of osteomyelitis or acute osseous abnormality identified.  Right fibula/tibia xray 10/04/17: IMPRESSION: 1. Soft tissue swelling and skin thickening.  No soft tissue gas. 2. No radiographic evidence of osteomyelitis or acute osseous abnormality identified.  Statin:  No. Beta Blocker:  No. Aspirin:  No. ACEI:  No. ARB:  Yes.   (PTA) CCB use:  No Other antiplatelets/anticoagulants:  Yes.   SQ heparin (DVT prophylaxis)   ASSESSMENT/PLAN: This is a 84 y.o. male with chronic wounds  to BLE with hx of venous stasis mixed with severe peripheral arterial disease.  He has been offered amputation vs wound care last year and  opted for wound care.   -pt with extensive ulcerations on BLE and due to his deconditioning, he most likely will require amputations, which palliative and the primary service has spoken with him about this.  I feel he has a false expectation of being able to walk with prostheses if he has the amputations as he does not walk now.  Dr. Scot Dock will see the pt later today.  His niece, Jackquline Bosch would like for him to call her after she sees him at [phone] (she had to leave to pick up a child).    Leontine Locket, PA-C [facility] [phone]

## 2017-10-06 NOTE — Progress Notes (Signed)
Medicine attending: I examined this patient today together with resident physician Dr. Guadlupe Spanish and I concur with his evaluation and management plan which we discussed together. He remains afebrile on antibiotics.  We appreciate wound care consultation and recommendations. He was much brighter and more interactive today.  Much easier to understand.  We again framed all of the critical issues in his care.  We greatly appreciate palliative care service input.  We reviewed that although very drastic, leg amputation would be a life saving surgery.  He is now willing to consider this.  We will consult vascular surgery who have been important in his management up until now. In addition, he states that he would be agreeable to living in a nursing facility where there will be more people around to provide care.

## 2017-10-06 NOTE — Progress Notes (Signed)
Subjective: Pt denied any pain today, says he feels pretty good.  No n/v, eating well.  No SOB.   Objective:  Vital signs in last 24 hours: Vitals:   10/05/17 1549 10/05/17 2157 10/06/17 0504 10/06/17 0621  BP: 112/66 100/67 (!) 135/115 (!) 128/58  Pulse: 90 80 72   Resp: 16 (!) 28 18   Temp: 97.7 F (36.5 C) 98.5 F (36.9 C) 98.1 F (36.7 C)   TempSrc: Oral Oral Oral   SpO2: 98% 94% 98%   Weight:  216 lb 11.4 oz (98.3 kg)    Height:       Physical Exam  Constitutional: He appears well-developed and well-nourished.  Cardiovascular: Normal rate, regular rhythm, normal heart sounds and intact distal pulses. Exam reveals no gallop and no friction rub.  No murmur heard. Pulses:      Dorsalis pedis pulses are 0 on the right side, and 0 on the left side.       Posterior tibial pulses are 0 on the right side, and 0 on the left side.  Pulmonary/Chest: Effort normal. No respiratory distress. He has no wheezes. He has rales in the right lower field and the left lower field.  Abdominal: Soft. Bowel sounds are normal. He exhibits no distension and no mass. There is no tenderness. There is no guarding.  Musculoskeletal: He exhibits edema (2+ pitting edema bilateral LE).  Neurological: He is alert.  Skin:  There are psoriatic plaques seen throughout the surface of the skin in a patchy distribution, worse on the anterior surface of the lower extremities.  The lower extremities from the knee down have corresponding patchy pink granulation tissue.  There are several ulcerations on the left leg>right leg.  Additionally there is a stage II sacral decubitus ulcer and some mild skin breakdown on the anterior scrotum.       Assessment/Plan:  Active Problems:   Cellulitis   PAD (peripheral artery disease) (H[facility])   Chronic renal insufficiency, stage 3 (moderate) ([facility])   HTN (hypertension), benign   Pressure injury of skin  Ulcerations of bilateral LE with surrounding cellulitis secondary to  PAD: progressive process secondary to T2DM, hyperlipidemia.  Left leg worse than right.  I am unsure if he understands the implications of not receiving amputation.     -appreciate palliative medicine's assistance -resumed home tramadol for pain control, requiring minimal doses -after speaking with [place] and palliative, today the patient has decided on amputation, we have consulted vascular surgery -PT to evaluate and treat   HFpEF: last ECHO 2014 showed G2DD with normal EF.  Does not appear volume overloaded on exam other than LE edema which is chronic. He is on lasix 20mg  TID at home.     -will increase to 20mg  of PO lasix in am followed by 40mg  in the afternoon   T2DM: pt states that he is borderline diabetic he is currently on no therapy for diabetes last known a1c 6.6 in 2016. A1C on admission 6.7   -will monitor for now does not appear to need correctional insulin   Gout: this was the patient's chief complaint, It is difficult to ascertain by exam whether his pain is from gout vs worsening vascular disease.  He takes allopurinol 300mg  daily and colchicine 0.6mg  daily.    -Uric acid elevated will continue allopurinol and colchicine     CKD 3: cr baseline appears to be about 1.5 pt is on losartan 25mg  daily   -will continue to monitor, improved with  fluids and holding lasix/losartan     Dispo: Anticipated discharge in approximately 3-4 day(s).   Katherine Roan, MD 10/06/2017, 10:45 AM Vickki Muff MD PGY-1 Internal Medicine Pager # [phone]

## 2017-10-06 NOTE — Progress Notes (Signed)
Daily Progress Note   Patient Name: [name] [name]       Date: 10/06/2017 DOB: [birth year]  Age: 84 y.o. MRN#: [id number] Attending Physician: Annia Belt, MD Primary Care Physician: Alvester Chou, NP Admit Date: 10/04/2017  Reason for Consultation/Follow-up: Establishing goals of care  Subjective: Patient is resting in bed eating breakfast. He states he has given a lot of thought to the conversations people have been having with him. We discussed his perceived pros and cons to amputation. He states he is worried about not being able to walk without his legs and losing functional ability. We discussed his pain, wounds and quality of life. After much thought, states he would like to have the amputations. He would be happy if he could ever use prosthetics, but would still like the surgeries even if he cannot.    Length of Stay: 2  Current Medications: Scheduled Meds:  . allopurinol  300 mg Oral Daily  . colchicine  0.6 mg Oral Daily  . furosemide  20 mg Oral Daily  . furosemide  40 mg Oral Daily  . gabapentin  100 mg Oral BID BM  . gabapentin  300 mg Oral QHS  . heparin  5,000 Units Subcutaneous Q8H  . metroNIDAZOLE  500 mg Oral Q8H  . multivitamin with minerals  1 tablet Oral Daily  . nutrition supplement (JUVEN)  1 packet Oral BID BM  . protein supplement shake  11 oz Oral Q24H  . verapamil  180 mg Oral Daily    Continuous Infusions: . cefTRIAXone (ROCEPHIN)  IV Stopped (10/05/17 2200)  . vancomycin      PRN Meds: traMADol  Physical Exam  Constitutional: No distress.  Pulmonary/Chest: Effort normal.  Neurological: He is alert.  Oriented.  Skin: Skin is warm and dry.            Vital Signs: BP (!) 128/58 (BP Location: Right Arm)   Pulse 72   Temp 98.1 F (36.7 C)  (Oral)   Resp 18   Ht 5\' 10"  (1.778 m)   Wt 98.3 kg (216 lb 11.4 oz)   SpO2 98%   BMI 31.09 kg/m  SpO2: SpO2: 98 % O2 Device: O2 Device: Room Air O2 Flow Rate:    Intake/output summary:   Intake/Output Summary (Last 24 hours) at 10/06/2017 1029 Last  data filed at 10/06/2017 1448 Gross per 24 hour  Intake 240 ml  Output 1460 ml  Net -1220 ml   LBM: Last BM Date: 10/03/17 Baseline Weight: Weight: 112 kg (247 lb) Most recent weight: Weight: 98.3 kg (216 lb 11.4 oz)       Palliative Assessment/Data: 50%      Patient Active Problem List   Diagnosis Date Noted  . Pressure injury of skin 10/04/2017  . Hyperkalemia 11/17/2016  . Venous stasis ulcer ([place]) 11/17/2016  . Venous stasis ulcer of lower extremity ([place]) 05/23/2015  . Cellulitis of both lower extremities 10/18/2014  . Chronic renal insufficiency, stage 3 (moderate) ([place]) 10/18/2014  . HTN (hypertension), benign 10/18/2014  . Chronic diastolic CHF (congestive heart failure) ([place]) 10/18/2014  . Lower extremity cellulitis 10/18/2014  . PAD (peripheral artery disease) ([place]) 02/02/2014  . Atherosclerosis of native arteries of the extremities with ulceration ([place]) 07/22/2013  . Acute renal failure ([place]) 11/15/2012  . Fall 11/15/2012  . Diabetic ulcer of left foot ([place]) 10/15/2012  . Diabetes mellitus ([place]) 10/15/2012  . Lymphadenitis 08/07/2012  . Cellulitis 08/07/2012  . HTN (hypertension) 08/05/2012  . Renal insufficiency 08/05/2012  . Tobacco abuse 08/05/2012  . Gout 08/05/2012  . Hyperglycemia 08/05/2012    Palliative Care Assessment & Plan   Patient Profile: Patient presented here to address his worsening pain from gout. It is difficult to discern where exactly he feels this pain seems like it may be the left ankle. He has had extensive vascular workup in the past and consultationis followed by Dr. Bridgett Larsson, bilateral amputation was recommended but the patient refused at that  time.   Assessment/Recommendations/Plan:  Plans for vascular surgery consult for bilateral amputations.   Continuing full scope of treatment.    Code Status:    Code Status Orders  (From admission, onward)        Start     Ordered   10/04/17 1653  Full code  Continuous     10/04/17 1657    Code Status History    Date Active Date Inactive Code Status Order ID Comments User Context   11/17/2016 2121 11/20/2016 2110 Full Code [id number]  Vianne Bulls, MD ED   10/18/2014 2302 10/22/2014 1659 Full Code [id number]  Rise Patience, MD Inpatient   11/15/2012 2242 11/19/2012 1909 Full Code [id number]  Rise Patience, MD Inpatient   10/16/2012 0049 10/19/2012 0016 Full Code [id number]  Rise Patience, MD Inpatient   08/05/2012 1744 08/13/2012 1935 Full Code [id number]  Barton Dubois, MD Inpatient       Prognosis:   Unable to determine  Discharge Planning:  To Be Determined  Care plan was discussed with primary team and RN.   Thank you for allowing the Palliative Medicine Team to assist in the care of this patient.   Time In: 10:00 Time Out: 10:50 Total Time 50 min Prolonged Time Billed yes      Greater than 50%  of this time was spent counseling and coordinating care related to the above assessment and plan.  Asencion Gowda, NP  Please contact Palliative Medicine Team phone at [phone] for questions and concerns.

## 2017-10-06 NOTE — Care Management Note (Addendum)
Case Management Note  Patient Details  Name: [name] [name] MRN: [id number] Date of Birth: Feb 10, 1927  Subjective/Objective:       Pt admitted with pain in LE - amputations recommended             Action/Plan:   PTA from home alone - received PCS aide 2 hours per day.  SNF recommended - CSW consulted    Expected Discharge Date:                  Expected Discharge Plan:  [place]  In-House Referral:  Clinical Social Work  Discharge planning Services  CM Consult  Post Acute Care Choice:    Choice offered to:     DME Arranged:    DME Agency:     HH Arranged:    [place] Agency:     Status of Service:  In process, will continue to follow  If discussed at Long Length of Stay Meetings, dates discussed:    Additional Comments:  Maryclare Labrador, RN 10/06/2017, 11:45 AM

## 2017-10-06 NOTE — Evaluation (Signed)
Physical Therapy Evaluation Patient Details Name: [name] [name] MRN: [id number] DOB: Jan 24, 1927 Today's Date: 10/06/2017   History of Present Illness  84 year old man with advanced peripheral vascular disease.  Bilateral amputation has been recommended to him by vascular surgery but he has declined.  PMH: hypertension, type 2 diabetes currently on diet alone, gout on allopurinol and colchicine, advanced to psoriasis, stage III chronic renal insufficiency.  Patient admitted due to pain in Bilateral LE's he attributes to gout.  Clinical Impression  Patient presents with decreased independence with mobility due to pain in LE's and unable to stand at this time.  Plans for LE amputations and currently being worked up for this.  Feel he will need skilled PT in the acute setting to maximize mobility and safety prior to d/c to SNF level rehab.     Follow Up Recommendations SNF;Supervision/Assistance - 24 hour    Equipment Recommendations  None recommended by PT    Recommendations for Other Services       Precautions / Restrictions Precautions Precautions: Fall      Mobility  Bed Mobility Overal bed mobility: Needs Assistance Bed Mobility: Supine to Sit;Sit to Supine     Supine to sit: Supervision;HOB elevated Sit to supine: Supervision;HOB elevated   General bed mobility comments: able to move legs off bed and pull up on rail with S only, to supine, legs on bed and trunk to supine, but needed +2  A to scoot up in bed  Transfers                 General transfer comment: NT due to severe foot pain  Ambulation/Gait                Stairs            Wheelchair Mobility    Modified Rankin (Stroke Patients Only)       Balance Overall balance assessment: Needs assistance Sitting-balance support: Feet unsupported;No upper extremity supported Sitting balance-Leahy Scale: Good Sitting balance - Comments: sitting EOB about 5 minutes, limited by pain in feet                                      Pertinent Vitals/Pain Pain Assessment: Faces Faces Pain Scale: Hurts whole lot Pain Location: feet and knees with certain positions or dependency Pain Descriptors / Indicators: Grimacing;Guarding;Aching;Sore Pain Intervention(s): Monitored during session;Repositioned;Limited activity within patient's tolerance    Home Living Family/patient expects to be discharged to:: Private residence Living Arrangements: Alone Available Help at Discharge: Personal care attendant(aide daily 2 hours a day) Type of Home: Apartment Home Access: Level entry     Home Layout: One level Home Equipment: Transport planner;Wheelchair - manual;Toilet riser      Prior Function Level of Independence: Needs assistance   Gait / Transfers Assistance Needed: does standing transfers to w/c or scooter  ADL's / Homemaking Assistance Needed: aide helps with bath and meals  Comments: has wound care at home 3 times a week     Hand Dominance   Dominant Hand: Right    Extremity/Trunk Assessment   Upper Extremity Assessment Upper Extremity Assessment: RUE deficits/detail;LUE deficits/detail RUE Deficits / Details: reports some gout in upper arm and some limited shoulder flexion AROM, strength grossly 4/5 except decreased grip strength RUE Sensation: history of peripheral neuropathy LUE Deficits / Details: AROM WFL, except wrist and hand limited due to "gout pain" and noted  edema in wrist and fingers LUE Sensation: history of peripheral neuropathy    Lower Extremity Assessment Lower Extremity Assessment: RLE deficits/detail;LLE deficits/detail RLE Deficits / Details: AROM WFL except foot and ankle limited by pain (only minimal ankle movement); strength hip flexion 3/5, knee extension 4/5 RLE Sensation: decreased light touch LLE Deficits / Details: AROM WFL, except foot and ankle limited by pain, more movement on ankle than R foot; strength hip flexion 3/5, knee  extension 4/5 LLE Sensation: decreased light touch    Cervical / Trunk Assessment Cervical / Trunk Assessment: Kyphotic;Other exceptions Cervical / Trunk Exceptions: psoriatic lesions on back, RN informed pt reports using cream on them at home and he does not have it here  Communication   Communication: [place];Expressive difficulties(slurred speech at times)  Cognition Arousal/Alertness: Awake/alert Behavior During Therapy: WFL for tasks assessed/performed Overall Cognitive Status: Within Functional Limits for tasks assessed                                        General Comments General comments (skin integrity, edema, etc.): Educated on need for UE use for scooting transfers as pt reports planning to have amputations done.      Exercises     Assessment/Plan    PT Assessment Patient needs continued PT services  PT Problem List Decreased strength;Decreased mobility;Decreased activity tolerance;Pain;Decreased balance;Decreased knowledge of use of DME;Decreased safety awareness       PT Treatment Interventions DME instruction;Functional mobility training;Balance training;Patient/family education;Wheelchair mobility training;Therapeutic activities;Therapeutic exercise    PT Goals (Current goals can be found in the Care Plan section)  Acute Rehab PT Goals Patient Stated Goal: to start therapy once he deals with pain in his legs PT Goal Formulation: With patient Time For Goal Achievement: 10/20/17 Potential to Achieve Goals: Fair    Frequency Min 3X/week   Barriers to discharge        Co-evaluation               AM-PAC PT "6 Clicks" Daily Activity  Outcome Measure Difficulty turning over in bed (including adjusting bedclothes, sheets and blankets)?: A Little Difficulty moving from lying on back to sitting on the side of the bed? : A Lot Difficulty sitting down on and standing up from a chair with arms (e.g., wheelchair, bedside commode, etc,.)?:  Unable Help needed moving to and from a bed to chair (including a wheelchair)?: Total Help needed walking in hospital room?: Total Help needed climbing 3-5 steps with a railing? : Total 6 Click Score: 9    End of Session   Activity Tolerance: Patient tolerated treatment well Patient left: in bed;with call bell/phone within reach   PT Visit Diagnosis: Other abnormalities of gait and mobility (R26.89);Pain Pain - Right/Left: (both) Pain - part of body: Ankle and joints of foot    Time: 1572-6203 PT Time Calculation (min) (ACUTE ONLY): 29 min   Charges:   PT Evaluation $PT Eval Moderate Complexity: 1 Mod PT Treatments $Therapeutic Activity: 8-22 mins   PT G CodesMagda Kiel, [place] [phone] 10/06/2017   Reginia Naas 10/06/2017, 11:28 AM

## 2017-10-06 NOTE — H&P (View-Only) (Signed)
Hospital Consult VASCULAR SURGERY ASSESSMENT & PLAN:   COMBINED PERIPHERAL VASCULAR DISEASE AND CHRONIC VENOUS INSUFFICIENCY: This patient has chronic wounds of both lower extremities secondary to infrainguinal arterial occlusive disease and chronic venous insufficiency.  He also has significant gout and extensive thick plaques of psoriasis in both legs.  He is 84 years old with some underlying dementia.  He is previously been seen in our office by Dr. Adele Barthel.  I would agree with Dr. Bridgett Larsson that he is not a good candidate for an aggressive approach which would include arteriography and possible revascularization.  I have left a message with his niece to discuss his care.  I think the best options would be continued conservative treatment as currently the wounds are not making him septic versus primary above-the-knee amputations.   [name] Mayo, MD, [place] [phone] Office: [phone]    Reason for Consult:  Leg wounds/amputations Requesting Physician:  [name] Levans, MD MRN #:  [id number]  History of Present Illness: This is a 84 y.o. male who has been seen in the past by Vascular Surgery.  He was last seen in July 2018 for bilateral leg wounds with mixed venous stasis ulcers mixed with severe peripheral arterial disease.  At the time, Dr. Oneida Alar had discussed amputation vs long term wound care and at the time, he went with wound care.    He was admitted to the hospital on 10/04/17 with increasing pain in his legs.  He says once admitted and with some medicine, his pain is not as bad as it was.  He states that he gets pains in the bottom of his feet that feels like a knife stabbing him.  When asked if hanging his legs down helps his pain, he says maybe for a little while.  He states that he lives at home alone and has a Marine scientist that comes in.  He doesn't walk due to the pain.  PT worked with him this morning and he was unable to stand.   He would like to have artificial legs after amputations.     Palliative care has been involved with the pt's care.    He is on medications for gout.  He is not on antiplatelet or statin.  He was on an ARB prior to admission.  Diabetes on chart, but pt denies and not on any medications for this.  Past Medical History:  Diagnosis Date  . Chronic kidney disease    RENAL INSUFICCIENCY  . Diabetes mellitus without complication ([place])   . DVT (deep venous thrombosis) ([place])   . Gout   . Hypertension   . Lymphadenitis 08/10/2012  . Psoriasis     History reviewed. No pertinent surgical history.  No Known Allergies  Prior to Admission medications   Medication Sig Start Date End Date Taking? Authorizing Provider  allopurinol (ZYLOPRIM) 300 MG tablet Take 300 mg by mouth daily.   Yes [provider]  colchicine 0.6 MG tablet Take 0.6 mg by mouth daily.   Yes [provider]  feeding supplement, ENSURE ENLIVE, (ENSURE ENLIVE) LIQD Take 237 mLs by mouth daily at 2 PM. 11/21/16  Yes Barton Dubois, MD  furosemide (LASIX) 20 MG tablet Take 20 mg by mouth 3 (three) times daily.   Yes [provider]  gabapentin (NEURONTIN) 100 MG capsule Take 100 mg by mouth See admin instructions. Patient takes 1 Capsule in the Morning, 1 Capsule every evening and 3 Capsules at Bedtime.   Yes [provider]  losartan (  COZAAR) 25 MG tablet Take 25 mg by mouth daily.   Yes [provider]  traMADol (ULTRAM) 50 MG tablet Take 100 mg by mouth 3 (three) times daily as needed for moderate pain.  11/17/16  Yes [provider]  verapamil (VERELAN PM) 180 MG 24 hr capsule Take 180 mg by mouth daily.   Yes [provider]  Amino Acids-Protein Hydrolys (FEEDING SUPPLEMENT, PRO-STAT SUGAR FREE 64,) LIQD Take 30 mLs by mouth 2 (two) times daily. 11/20/16   Barton Dubois, MD  triamcinolone ointment (KENALOG) 0.1 % Apply 1 application topically 2 (two) times daily.    [provider]  Vitamin D, Ergocalciferol,  (DRISDOL) 50000 units CAPS capsule Take 50,000 Units by mouth every 7 (seven) days.    [provider]    Social History   Socioeconomic History  . Marital status: Widowed    Spouse name: Not on file  . Number of children: Not on file  . Years of education: Not on file  . Highest education level: Not on file  Occupational History  . Not on file  Social Needs  . Financial resource strain: Not on file  . Food insecurity:    Worry: Not on file    Inability: Not on file  . Transportation needs:    Medical: Not on file    Non-medical: Not on file  Tobacco Use  . Smoking status: Former Smoker    Years: 40.00    Types: Pipe  . Smokeless tobacco: Never Used  Substance and Sexual Activity  . Alcohol use: No  . Drug use: No  . Sexual activity: Not on file  Lifestyle  . Physical activity:    Days per week: Not on file    Minutes per session: Not on file  . Stress: Not on file  Relationships  . Social connections:    Talks on phone: Not on file    Gets together: Not on file    Attends religious service: Not on file    Active member of club or organization: Not on file    Attends meetings of clubs or organizations: Not on file    Relationship status: Not on file  . Intimate partner violence:    Fear of current or ex partner: Not on file    Emotionally abused: Not on file    Physically abused: Not on file    Forced sexual activity: Not on file  Other Topics Concern  . Not on file  Social History Narrative  . Not on file     Family History  Problem Relation Age of Onset  . Hypertension Mother   . Stroke Mother   . Hypertension Father     ROS: [x]  Positive   [ ]  Negative   [ ]  All sytems reviewed and are negative  Cardiac: []  chest pain/pressure []  palpitations []  SOB lying flat []  DOE  Vascular: []  pain in legs while walking [x]  pain in legs at rest []  pain in legs at night []  non-healing ulcers []  hx of DVT []  swelling in legs  Pulmonary: []   productive cough []  asthma/wheezing []  home O2  Neurologic: []  weakness in []  arms []  legs []  numbness in []  arms []  legs []  hx of CVA []  mini stroke [] difficulty speaking or slurred speech []  temporary loss of vision in one eye []  dizziness  Hematologic: []  hx of cancer []  bleeding problems []  problems with blood clotting easily  Endocrine:   []  diabetes []   thyroid disease  GI []  vomiting blood []  blood in stool  GU: [x]  CKD/renal failure []  HD--[]  M/W/F or []  T/T/S []  burning with urination []  blood in urine  Psychiatric: []  anxiety []  depression  Musculoskeletal: []  arthritis []  joint pain [x]  gout  Integumentary: []  rashes [x]  ulcers bilateral legs  Constitutional: []  fever []  chills   Physical Examination  Vitals:   10/06/17 1000 10/06/17 1101  BP: 131/67 (!) 103/55  Pulse: 76   Resp: 18   Temp: 98 F (36.7 C)   SpO2: 98%    Body mass index is 31.09 kg/m.  General:  WDWN in NAD Gait: Not observed HENT: WNL, normocephalic Pulmonary: normal non-labored breathing, without Rales, rhonchi,  wheezing Cardiac: regular, without  Murmurs, rubs or gallops; without carotid bruits Abdomen:  soft, NT/ND, no masses Skin: without rashes Vascular Exam/Pulses:  Right Left  Radial Unable to palpate  1+ (weak)  Ulnar Unable to palpate  Unable to palpate   Popliteal Unable to palpate  Unable to palpate   DP Unable to palpate  Unable to palpate   PT Unable to palpate  Unable to palpate    Extremities: with ischemic changes/ulcerations bilaterally; sensation in tact bilateral feet. Minimal motor in tact. Musculoskeletal: no muscle wasting or atrophy  Neurologic: A&O X 3;  No focal weakness or paresthesias are detected; speech is fluent/normal Psychiatric:  The pt has Normal affect.   CBC    Component Value Date/Time   WBC 6.8 10/05/2017 0434   RBC 3.62 (L) 10/05/2017 0434   HGB 11.0 (L) 10/05/2017 0434   HCT 34.5 (L) 10/05/2017 0434   PLT  304 10/05/2017 0434   MCV 95.3 10/05/2017 0434   MCH 30.4 10/05/2017 0434   MCHC 31.9 10/05/2017 0434   RDW 13.4 10/05/2017 0434   LYMPHSABS 1.0 11/18/2016 0457   MONOABS 0.7 11/18/2016 0457   EOSABS 0.1 11/18/2016 0457   BASOSABS 0.0 11/18/2016 0457    BMET    Component Value Date/Time   NA 137 10/05/2017 0434   K 3.9 10/05/2017 0434   CL 103 10/05/2017 0434   CO2 27 10/05/2017 0434   GLUCOSE 113 (H) 10/05/2017 0434   BUN 49 (H) 10/05/2017 0434   CREATININE 1.59 (H) 10/05/2017 0434   CALCIUM 8.4 (L) 10/05/2017 0434   GFRNONAA 37 (L) 10/05/2017 0434   GFRAA 43 (L) 10/05/2017 0434    COAGS: Lab Results  Component Value Date   INR 1.03 10/30/2009     Non-Invasive Vascular Imaging:   Left foot xray 10/04/17: IMPRESSION: 1. Severe soft tissue swelling. No soft tissue gas, discrete soft tissue wound. 2. No radiographic evidence of osteomyelitis or acute osseous abnormality identified. 3. Calcified peripheral vascular disease.  Left fibula/tibia xray 10/04/17: IMPRESSION: 1. Soft tissue swelling and skin thickening.  No soft tissue gas. 2. No radiographic evidence of osteomyelitis or acute osseous abnormality identified.  Right foot xray 10/04/17: IMPRESSION: 1. Soft tissue swelling and skin thickening.  No soft tissue gas. 2. No radiographic evidence of osteomyelitis or acute osseous abnormality identified.  Right fibula/tibia xray 10/04/17: IMPRESSION: 1. Soft tissue swelling and skin thickening.  No soft tissue gas. 2. No radiographic evidence of osteomyelitis or acute osseous abnormality identified.  Statin:  No. Beta Blocker:  No. Aspirin:  No. ACEI:  No. ARB:  Yes.   (PTA) CCB use:  No Other antiplatelets/anticoagulants:  Yes.   SQ heparin (DVT prophylaxis)   ASSESSMENT/PLAN: This is a 84 y.o. male with chronic wounds  to BLE with hx of venous stasis mixed with severe peripheral arterial disease.  He has been offered amputation vs wound care last year and  opted for wound care.   -pt with extensive ulcerations on BLE and due to his deconditioning, he most likely will require amputations, which palliative and the primary service has spoken with him about this.  I feel he has a false expectation of being able to walk with prostheses if he has the amputations as he does not walk now.  Dr. Scot Dock will see the pt later today.  His niece, Jackquline Bosch would like for him to call her after she sees him at [phone] (she had to leave to pick up a child).    Leontine Locket, PA-C [facility] [phone]

## 2017-10-07 LAB — CBC
HEMATOCRIT: 34.1 % — AB (ref 39.0–52.0)
Hemoglobin: 11 g/dL — ABNORMAL LOW (ref 13.0–17.0)
MCH: 30.2 pg (ref 26.0–34.0)
MCHC: 32.3 g/dL (ref 30.0–36.0)
MCV: 93.7 fL (ref 78.0–100.0)
Platelets: 325 10*3/uL (ref 150–400)
RBC: 3.64 MIL/uL — ABNORMAL LOW (ref 4.22–5.81)
RDW: 13.6 % (ref 11.5–15.5)
WBC: 6.3 10*3/uL (ref 4.0–10.5)

## 2017-10-07 LAB — BASIC METABOLIC PANEL
Anion gap: 11 (ref 5–15)
BUN: 26 mg/dL — AB (ref 6–20)
CALCIUM: 8.8 mg/dL — AB (ref 8.9–10.3)
CO2: 30 mmol/L (ref 22–32)
CREATININE: 1.46 mg/dL — AB (ref 0.61–1.24)
Chloride: 96 mmol/L — ABNORMAL LOW (ref 101–111)
GFR calc non Af Amer: 41 mL/min — ABNORMAL LOW (ref >60)
GFR, EST AFRICAN AMERICAN: 47 mL/min — AB (ref >60)
GLUCOSE: 103 mg/dL — AB (ref 65–99)
Potassium: 3.7 mmol/L (ref 3.5–5.1)
Sodium: 137 mmol/L (ref 135–145)

## 2017-10-07 LAB — SURGICAL PCR SCREEN
MRSA, PCR: NEGATIVE
STAPHYLOCOCCUS AUREUS: POSITIVE — AB

## 2017-10-07 MED ORDER — POLYETHYLENE GLYCOL 3350 17 G PO PACK
17.0000 g | PACK | Freq: Every day | ORAL | Status: DC
  Administered 2017-10-07 – 2017-10-13 (×6): 17 g via ORAL
  Filled 2017-10-07 (×6): qty 1

## 2017-10-07 MED ORDER — SALINE SPRAY 0.65 % NA SOLN
1.0000 | NASAL | Status: DC | PRN
  Filled 2017-10-07: qty 44

## 2017-10-07 MED ORDER — MUPIROCIN 2 % EX OINT
1.0000 "application " | TOPICAL_OINTMENT | Freq: Two times a day (BID) | CUTANEOUS | Status: AC
  Administered 2017-10-07 – 2017-10-12 (×10): 1 via NASAL
  Filled 2017-10-07 (×6): qty 22

## 2017-10-07 MED ORDER — POTASSIUM CHLORIDE CRYS ER 20 MEQ PO TBCR
40.0000 meq | EXTENDED_RELEASE_TABLET | Freq: Once | ORAL | Status: AC
  Administered 2017-10-07: 40 meq via ORAL
  Filled 2017-10-07: qty 2

## 2017-10-07 MED ORDER — CHLORHEXIDINE GLUCONATE CLOTH 2 % EX PADS
6.0000 | MEDICATED_PAD | Freq: Every day | CUTANEOUS | Status: AC
  Administered 2017-10-07 – 2017-10-11 (×3): 6 via TOPICAL

## 2017-10-07 NOTE — Progress Notes (Signed)
   VASCULAR SURGERY ASSESSMENT & PLAN:   I have spoken to the patient's niece and the niece and the patient are agreeable to proceed with bilateral above-the-knee amputations.  I have explained to her that I do not think he is a candidate for prostheses and she understands that but thinks that the patient needs to hear this also.  I am in the office today but will ask someone from our team to swing by to talk to him again.  I did speak to him this morning and he seemed comfortable with the decision to proceed with bilateral AKA's.  This has been scheduled for tomorrow with Dr. Adele Barthel.   SUBJECTIVE:   Complains of bilateral leg pain.  PHYSICAL EXAM:   Vitals:   10/06/17 1101 10/06/17 1809 10/06/17 2105 10/07/17 0552  BP: (!) 103/55 115/65 116/65 119/71  Pulse:  78 66 (!) 110  Resp:  18 14 (!) 40  Temp:  98.6 F (37 C) 99.1 F (37.3 C) 98.1 F (36.7 C)  TempSrc:  Oral Oral Oral  SpO2:  98% 97% 96%  Weight:   246 lb 4.8 oz (111.7 kg)   Height:       No change in wounds on both legs.  LABS:   Lab Results  Component Value Date   WBC 6.3 10/07/2017   HGB 11.0 (L) 10/07/2017   HCT 34.1 (L) 10/07/2017   MCV 93.7 10/07/2017   PLT 325 10/07/2017   Lab Results  Component Value Date   CREATININE 1.46 (H) 10/07/2017   Lab Results  Component Value Date   INR 1.03 10/30/2009   CBG (last 3)  Recent Labs    10/06/17 0749 10/06/17 1150 10/06/17 1643  GLUCAP 93 104* 112*    PROBLEM LIST:    Active Problems:   Cellulitis   PAD (peripheral artery disease) (HCC)   Chronic renal insufficiency, stage 3 (moderate) (HCC)   HTN (hypertension), benign   Pressure injury of skin   CURRENT MEDS:   . allopurinol  300 mg Oral Daily  . colchicine  0.6 mg Oral Daily  . furosemide  40 mg Oral Daily  . gabapentin  100 mg Oral BID BM  . gabapentin  300 mg Oral QHS  . heparin  5,000 Units Subcutaneous Q8H  . metroNIDAZOLE  500 mg Oral Q8H  . multivitamin with minerals  1 tablet  Oral Daily  . nutrition supplement (JUVEN)  1 packet Oral BID BM  . protein supplement shake  11 oz Oral Q24H  . verapamil  180 mg Oral Daily    [name] Beeper: [phone] Office: [phone] 10/07/2017

## 2017-10-07 NOTE — Progress Notes (Signed)
Subjective: Pt had worsening of his pain in his legs yesterday afternoon and leading into this morning. He describes it as an aching pain and numbness up and down the legs, not in any particular joint.   Denies SOB.     Objective:  Vital signs in last 24 hours: Vitals:   10/06/17 1809 10/06/17 2105 10/07/17 0552 10/07/17 0924  BP: 115/65 116/65 119/71 101/62  Pulse: 78 66 (!) 110 85  Resp: 18 14 (!) 40 18  Temp: 98.6 F (37 C) 99.1 F (37.3 C) 98.1 F (36.7 C) 98.2 F (36.8 C)  TempSrc: Oral Oral Oral Oral  SpO2: 98% 97% 96% 99%  Weight:  246 lb 4.8 oz (111.7 kg)    Height:       Physical Exam  Constitutional: He appears well-developed and well-nourished.  Cardiovascular: Normal rate, regular rhythm, normal heart sounds and intact distal pulses. Exam reveals no gallop and no friction rub.  No murmur heard. Pulses:      Dorsalis pedis pulses are 0 on the right side, and 0 on the left side.       Posterior tibial pulses are 0 on the right side, and 0 on the left side.  Pulmonary/Chest: Effort normal. No respiratory distress. He has no wheezes. He has rales (fine LLF) in the left lower field.  Abdominal: Soft. Bowel sounds are normal. He exhibits no distension and no mass. There is no tenderness. There is no guarding.  Musculoskeletal: He exhibits edema (1+ continues to improve).  Neurological: He is alert.  Skin:  There are psoriatic plaques seen throughout the surface of the skin in a patchy distribution, worse on the anterior surface of the lower extremities.  The lower extremities from the knee down have corresponding patchy pink granulation tissue.  There are several ulcerations on the left leg>right leg.  Additionally there is a stage II sacral decubitus ulcer and some mild skin breakdown on the anterior scrotum. The legs are wrapped with kerlex.        Assessment/Plan:  Active Problems:   Cellulitis   PAD (peripheral artery disease) (HCC)   Chronic renal  insufficiency, stage 3 (moderate) (HCC)   HTN (hypertension), benign   Pressure injury of skin  Ulcerations of bilateral LE with surrounding cellulitis secondary to PAD: progressive process secondary to T2DM, hyperlipidemia.  Left leg worse than right.  I am unsure if he understands the implications of not receiving amputation.     -appreciate palliative medicine's assistance -resumed home tramadol for pain control, requiring minimal doses -after speaking with [place] and palliative, today the patient has decided on amputation, we have consulted vascular surgery who will proceed with amputation 10/08/17. -PT to evaluate and treat   HFpEF: last ECHO 2014 showed G2DD with normal EF.  Does not appear volume overloaded on exam other than LE edema which is chronic. He is on lasix 20mg  TID at home.     -volume better today creatinine stable will do 40mg  lasix daily   T2DM: pt states that he is borderline diabetic he is currently on no therapy for diabetes last known a1c 6.6 in 2016. A1C on admission 6.7   -will monitor for now does not appear to need correctional insulin   Gout: this was the patient's chief complaint, It is difficult to ascertain by exam whether his pain is from gout vs worsening vascular disease.  He takes allopurinol 300mg  daily and colchicine 0.6mg  daily.    -Uric acid elevated will continue allopurinol  and colchicine     CKD 3: cr baseline appears to be about 1.5 pt is on losartan 25mg  daily   -will continue to monitor, improved with fluids adjusting diuretics     Dispo: Anticipated discharge in approximately 3-4 day(s).   Katherine Roan, MD 10/07/2017, 11:23 AM Vickki Muff MD PGY-1 Internal Medicine Pager # [phone]

## 2017-10-07 NOTE — Progress Notes (Signed)
Daily Progress Note   Patient Name: [name] [name]       Date: 10/07/2017 DOB: 05-10-1926  Age: 84 y.o. MRN#: [id number] Attending Physician: Annia Belt, MD Primary Care Physician: Alvester Chou, NP Admit Date: 10/04/2017  Reason for Consultation/Follow-up: Establishing goals of care  Subjective: Patient is resting in bed. He states he is ready for him amputations tomorrow. He is looking forward to decreased pain. Reassessed goals. He would like all care possible.    Length of Stay: 3  Current Medications: Scheduled Meds:  . allopurinol  300 mg Oral Daily  . colchicine  0.6 mg Oral Daily  . furosemide  40 mg Oral Daily  . gabapentin  100 mg Oral BID BM  . gabapentin  300 mg Oral QHS  . heparin  5,000 Units Subcutaneous Q8H  . metroNIDAZOLE  500 mg Oral Q8H  . multivitamin with minerals  1 tablet Oral Daily  . nutrition supplement (JUVEN)  1 packet Oral BID BM  . polyethylene glycol  17 g Oral Daily  . protein supplement shake  11 oz Oral Q24H  . verapamil  180 mg Oral Daily    Continuous Infusions: . cefTRIAXone (ROCEPHIN)  IV Stopped (10/06/17 2146)  . vancomycin Stopped (10/06/17 2117)    PRN Meds: traMADol  Physical Exam  Constitutional: No distress.  Pulmonary/Chest: Effort normal.  Neurological: He is alert.  Oriented.  Skin: Skin is warm and dry.            Vital Signs: BP 101/62 (BP Location: Right Arm)   Pulse 85   Temp 98.2 F (36.8 C) (Oral)   Resp 18   Ht 5\' 10"  (1.778 m)   Wt 111.7 kg (246 lb 4.8 oz)   SpO2 99%   BMI 35.34 kg/m  SpO2: SpO2: 99 % O2 Device: O2 Device: Room Air O2 Flow Rate:    Intake/output summary:   Intake/Output Summary (Last 24 hours) at 10/07/2017 1609 Last data filed at 10/07/2017 1231 Gross per 24 hour  Intake 1330  ml  Output 2655 ml  Net -1325 ml   LBM: Last BM Date: 10/03/17 Baseline Weight: Weight: 112 kg (247 lb) Most recent weight: Weight: 111.7 kg (246 lb 4.8 oz)       Palliative Assessment/Data: 40%      Patient Active Problem  List   Diagnosis Date Noted  . Pressure injury of skin 10/04/2017  . Hyperkalemia 11/17/2016  . Venous stasis ulcer ([place]) 11/17/2016  . Venous stasis ulcer of lower extremity ([place]) 05/23/2015  . Cellulitis of both lower extremities 10/18/2014  . Chronic renal insufficiency, stage 3 (moderate) ([place]) 10/18/2014  . HTN (hypertension), benign 10/18/2014  . Chronic diastolic CHF (congestive heart failure) ([place]) 10/18/2014  . Lower extremity cellulitis 10/18/2014  . PAD (peripheral artery disease) ([place]) 02/02/2014  . Atherosclerosis of native arteries of the extremities with ulceration ([place]) 07/22/2013  . Acute renal failure ([place]) 11/15/2012  . Fall 11/15/2012  . Diabetic ulcer of left foot ([place]) 10/15/2012  . Diabetes mellitus ([place]) 10/15/2012  . Lymphadenitis 08/07/2012  . Cellulitis 08/07/2012  . HTN (hypertension) 08/05/2012  . Renal insufficiency 08/05/2012  . Tobacco abuse 08/05/2012  . Gout 08/05/2012  . Hyperglycemia 08/05/2012    Palliative Care Assessment & Plan   Patient Profile: Patient presented here to address his worsening pain from gout. It is difficult to discern where exactly he feels this pain seems like it may be the left ankle. He has had extensive vascular workup in the past and consultationis followed by Dr. Bridgett Larsson, bilateral amputation was recommended but the patient refused at that time.   Assessment/Recommendations/Plan:  Bilateral amputations tomorrow.   Continuing full scope of treatment.    Code Status:    Code Status Orders  (From admission, onward)        Start     Ordered   10/04/17 1653  Full code  Continuous     10/04/17 1657    Code Status History    Date Active Date Inactive Code Status Order ID  Comments User Context   11/17/2016 2121 11/20/2016 2110 Full Code [id number]  Vianne Bulls, MD ED   10/18/2014 2302 10/22/2014 1659 Full Code [id number]  Rise Patience, MD Inpatient   11/15/2012 2242 11/19/2012 1909 Full Code [id number]  Rise Patience, MD Inpatient   10/16/2012 0049 10/19/2012 0016 Full Code [id number]  Rise Patience, MD Inpatient   08/05/2012 1744 08/13/2012 1935 Full Code [id number]  Barton Dubois, MD Inpatient       Prognosis:   Unable to determine  Discharge Planning:  To Be Determined  Care plan was discussed with primary team and RN.   Thank you for allowing the Palliative Medicine Team to assist in the care of this patient.   Total Time 15 min Prolonged Time Billed no      Greater than 50%  of this time was spent counseling and coordinating care related to the above assessment and plan.  Asencion Gowda, NP  Please contact Palliative Medicine Team phone at [phone] for questions and concerns.

## 2017-10-07 NOTE — Progress Notes (Signed)
Medicine attending: I examined this patient today together with resident physician Dr. Guadlupe Spanish and I concur with his evaluation and management plan. We appreciate vascular surgery input. The patient has decided that he would proceed with bilateral amputations if indicated. The patient is having more pain and numbness in his legs today.  He is essentially living a bed to chair existence at present. Patient's niece is present today.  Vascular surgeon has discussed the patient's status with her. All parties agreed to proceed with planned amputations. Labs are at his baseline with creatinine 1.5.  Hemoglobin 11. He remains afebrile on current antibiotics started on admission. Baseline cardiogram with sinus rhythm.  Q waves in leads V1 and V2. Most recent echocardiogram was done in April 2014 and at that time left ventricular function was normal at 55-60%.  Mild LVH.  Grade 2 diastolic dysfunction. Chest x-ray on October 04, 2017 shows stable cardiomegaly otherwise normal. Blood sugars in excellent range on diet alone. Medically clear to proceed with surgery.

## 2017-10-07 NOTE — Progress Notes (Signed)
Pharmacy Antibiotic Note  [name] [name] is a 84 y.o. male admitted on 10/04/2017 with suspected diabetic foot infection.  Pharmacy has been consulted for vancomycin dosing.  Has hx of PAD and gout. R/L foot x-rays negative for signs of osteomyelitis. WBC WNL. Afebrile. . Scr 1.46 (normCrCl 43 mL/min).  Plan: Will continue vancomycin 1500 mg IV every 48 hours  Hold VT until steady state Monitor renal function, clinical pic, cx results  Height: 5\' 10"  (177.8 cm) Weight: 246 lb 4.8 oz (111.7 kg) IBW/kg (Calculated) : 73  Temp (24hrs), Avg:98.5 F (36.9 C), Min:98.1 F (36.7 C), Max:99.1 F (37.3 C)  Recent Labs  Lab 10/04/17 1304 10/04/17 1419 10/04/17 1427 10/04/17 1428 10/04/17 1939 10/04/17 2248 10/05/17 0434 10/06/17 1233 10/07/17 0519  WBC  --  6.1  --   --   --   --  6.8 6.3 6.3  CREATININE  --  1.99* 1.80*  --   --   --  1.59* 1.49* 1.46*  LATICACIDVEN 2.52*  --   --  3.19* 3.0* 2.7* 1.8  --   --     Estimated Creatinine Clearance: 42.9 mL/min (A) (by C-G formula based on SCr of 1.46 mg/dL (H)).    No Known Allergies  Antimicrobials this admission: Ceftriaxone 6/9 >> Metronidazole 6/9>> Vancomycin 6/9 >>    Dose adjustments this admission: N/A  Microbiology results: 6/9 BCx: NGTD 6/9 UCx: NGTD     Taletha Twiford A. Levada Dy, PharmD, [place] Pager: [phone]  10/07/2017 10:17 AM

## 2017-10-08 ENCOUNTER — Encounter (HOSPITAL_COMMUNITY): Payer: Self-pay | Admitting: Orthopedic Surgery

## 2017-10-08 ENCOUNTER — Inpatient Hospital Stay (HOSPITAL_COMMUNITY): Payer: Medicare Other | Admitting: Certified Registered"

## 2017-10-08 ENCOUNTER — Encounter (HOSPITAL_COMMUNITY)
Admission: EM | Disposition: A | Discharge status: Skilled Nursing Facility | Payer: Self-pay | Source: Home / Self Care | Attending: Oncology

## 2017-10-08 HISTORY — PX: AMPUTATION: SHX166

## 2017-10-08 LAB — CBC
HEMATOCRIT: 36.7 % — AB (ref 39.0–52.0)
HEMOGLOBIN: 11.9 g/dL — AB (ref 13.0–17.0)
MCH: 30.6 pg (ref 26.0–34.0)
MCHC: 32.4 g/dL (ref 30.0–36.0)
MCV: 94.3 fL (ref 78.0–100.0)
Platelets: 374 10*3/uL (ref 150–400)
RBC: 3.89 MIL/uL — AB (ref 4.22–5.81)
RDW: 14 % (ref 11.5–15.5)
WBC: 6.5 10*3/uL (ref 4.0–10.5)

## 2017-10-08 LAB — BASIC METABOLIC PANEL
ANION GAP: 12 (ref 5–15)
BUN: 24 mg/dL — ABNORMAL HIGH (ref 6–20)
CHLORIDE: 94 mmol/L — AB (ref 101–111)
CO2: 31 mmol/L (ref 22–32)
CREATININE: 1.56 mg/dL — AB (ref 0.61–1.24)
Calcium: 9 mg/dL (ref 8.9–10.3)
GFR calc non Af Amer: 38 mL/min — ABNORMAL LOW (ref >60)
GFR, EST AFRICAN AMERICAN: 44 mL/min — AB (ref >60)
Glucose, Bld: 100 mg/dL — ABNORMAL HIGH (ref 65–99)
Potassium: 3.9 mmol/L (ref 3.5–5.1)
SODIUM: 137 mmol/L (ref 135–145)

## 2017-10-08 LAB — GLUCOSE, CAPILLARY: GLUCOSE-CAPILLARY: 78 mg/dL (ref 65–99)

## 2017-10-08 SURGERY — AMPUTATION, ABOVE KNEE
Anesthesia: Monitor Anesthesia Care | Site: Leg Upper | Laterality: Bilateral

## 2017-10-08 MED ORDER — ONDANSETRON HCL 4 MG/2ML IJ SOLN
INTRAMUSCULAR | Status: DC | PRN
  Administered 2017-10-08: 4 mg via INTRAVENOUS

## 2017-10-08 MED ORDER — CEFAZOLIN SODIUM-DEXTROSE 2-4 GM/100ML-% IV SOLN
2.0000 g | Freq: Once | INTRAVENOUS | Status: AC
  Administered 2017-10-08: 2 g via INTRAVENOUS

## 2017-10-08 MED ORDER — PROPOFOL 10 MG/ML IV BOLUS
INTRAVENOUS | Status: AC
  Filled 2017-10-08: qty 20

## 2017-10-08 MED ORDER — HYDROMORPHONE HCL 1 MG/ML IJ SOLN
0.5000 mg | INTRAMUSCULAR | Status: DC | PRN
  Administered 2017-10-08: 0.5 mg via INTRAVENOUS
  Filled 2017-10-08: qty 1

## 2017-10-08 MED ORDER — 0.9 % SODIUM CHLORIDE (POUR BTL) OPTIME
TOPICAL | Status: DC | PRN
  Administered 2017-10-08: 1000 mL

## 2017-10-08 MED ORDER — LIDOCAINE 2% (20 MG/ML) 5 ML SYRINGE
INTRAMUSCULAR | Status: DC | PRN
  Administered 2017-10-08: 20 mg via INTRAVENOUS

## 2017-10-08 MED ORDER — ENOXAPARIN SODIUM 40 MG/0.4ML ~~LOC~~ SOLN
40.0000 mg | SUBCUTANEOUS | Status: DC
  Administered 2017-10-09 – 2017-10-13 (×5): 40 mg via SUBCUTANEOUS
  Filled 2017-10-08 (×5): qty 0.4

## 2017-10-08 MED ORDER — BUPIVACAINE HCL (PF) 0.5 % IJ SOLN
INTRAMUSCULAR | Status: DC | PRN
  Administered 2017-10-08: 3 mL via INTRATHECAL

## 2017-10-08 MED ORDER — PROPOFOL 500 MG/50ML IV EMUL
INTRAVENOUS | Status: DC | PRN
  Administered 2017-10-08: 75 ug/kg/min via INTRAVENOUS

## 2017-10-08 MED ORDER — FENTANYL CITRATE (PF) 250 MCG/5ML IJ SOLN
INTRAMUSCULAR | Status: DC | PRN
  Administered 2017-10-08: 25 ug via INTRAVENOUS

## 2017-10-08 MED ORDER — LACTATED RINGERS IV SOLN
INTRAVENOUS | Status: DC
  Administered 2017-10-08: 13:00:00 via INTRAVENOUS

## 2017-10-08 MED ORDER — PHENYLEPHRINE 40 MCG/ML (10ML) SYRINGE FOR IV PUSH (FOR BLOOD PRESSURE SUPPORT)
PREFILLED_SYRINGE | INTRAVENOUS | Status: AC
  Filled 2017-10-08: qty 10

## 2017-10-08 MED ORDER — PROPOFOL 10 MG/ML IV BOLUS
INTRAVENOUS | Status: DC | PRN
  Administered 2017-10-08 (×7): 20 mg via INTRAVENOUS

## 2017-10-08 MED ORDER — DEXTROSE 5 % IV SOLN
INTRAVENOUS | Status: DC | PRN
  Administered 2017-10-08: 40 ug/min via INTRAVENOUS

## 2017-10-08 MED ORDER — PHENYLEPHRINE 40 MCG/ML (10ML) SYRINGE FOR IV PUSH (FOR BLOOD PRESSURE SUPPORT)
PREFILLED_SYRINGE | INTRAVENOUS | Status: DC | PRN
  Administered 2017-10-08: 120 ug via INTRAVENOUS

## 2017-10-08 MED ORDER — FENTANYL CITRATE (PF) 100 MCG/2ML IJ SOLN
50.0000 ug | INTRAMUSCULAR | Status: DC | PRN
  Administered 2017-10-08: 50 ug via INTRAVENOUS
  Filled 2017-10-08: qty 2

## 2017-10-08 MED ORDER — DEXAMETHASONE SODIUM PHOSPHATE 10 MG/ML IJ SOLN
INTRAMUSCULAR | Status: AC
  Filled 2017-10-08: qty 1

## 2017-10-08 MED ORDER — ONDANSETRON HCL 4 MG/2ML IJ SOLN
INTRAMUSCULAR | Status: AC
  Filled 2017-10-08: qty 2

## 2017-10-08 MED ORDER — CEFAZOLIN SODIUM-DEXTROSE 2-4 GM/100ML-% IV SOLN
INTRAVENOUS | Status: AC
  Filled 2017-10-08: qty 100

## 2017-10-08 MED ORDER — MIDAZOLAM HCL 5 MG/5ML IJ SOLN: INTRAMUSCULAR | Status: DC | PRN

## 2017-10-08 MED ORDER — FUROSEMIDE 40 MG PO TABS
20.0000 mg | ORAL_TABLET | Freq: Every day | ORAL | Status: DC
  Administered 2017-10-09 – 2017-10-10 (×2): 20 mg via ORAL
  Filled 2017-10-08 (×2): qty 1

## 2017-10-08 MED ORDER — FENTANYL CITRATE (PF) 250 MCG/5ML IJ SOLN
INTRAMUSCULAR | Status: AC
  Filled 2017-10-08: qty 5

## 2017-10-08 SURGICAL SUPPLY — 55 items
BANDAGE ACE 4X5 VEL STRL LF (GAUZE/BANDAGES/DRESSINGS) ×1 IMPLANT
BANDAGE ACE 6X5 VEL STRL LF (GAUZE/BANDAGES/DRESSINGS) ×5 IMPLANT
BANDAGE ESMARK 6X9 LF (GAUZE/BANDAGES/DRESSINGS) ×1 IMPLANT
BLADE SAW SAG 73X25 THK (BLADE) ×1
BLADE SAW SGTL 73X25 THK (BLADE) ×2 IMPLANT
BNDG CMPR 9X4 STRL LF SNTH (GAUZE/BANDAGES/DRESSINGS) ×1
BNDG CMPR 9X6 STRL LF SNTH (GAUZE/BANDAGES/DRESSINGS) ×1
BNDG COHESIVE 6X5 TAN STRL LF (GAUZE/BANDAGES/DRESSINGS) ×5 IMPLANT
BNDG ESMARK 4X9 LF (GAUZE/BANDAGES/DRESSINGS) ×2 IMPLANT
BNDG ESMARK 6X9 LF (GAUZE/BANDAGES/DRESSINGS) ×3
BNDG GAUZE ELAST 4 BULKY (GAUZE/BANDAGES/DRESSINGS) ×6 IMPLANT
CANISTER SUCT 3000ML PPV (MISCELLANEOUS) ×3 IMPLANT
CLIP VESOCCLUDE MED 6/CT (CLIP) ×3 IMPLANT
COVER BACK TABLE 60X90IN (DRAPES) ×3 IMPLANT
COVER SURGICAL LIGHT HANDLE (MISCELLANEOUS) ×6 IMPLANT
CUFF TOURNIQUET SINGLE 34IN LL (TOURNIQUET CUFF) ×2 IMPLANT
DRAIN CHANNEL 19F RND (DRAIN) IMPLANT
DRAPE HALF SHEET 40X57 (DRAPES) ×3 IMPLANT
DRAPE ORTHO SPLIT 77X108 STRL (DRAPES) ×3
DRAPE SURG ORHT 6 SPLT 77X108 (DRAPES) ×2 IMPLANT
DRAPE U-SHAPE 47X51 STRL (DRAPES) ×1 IMPLANT
DRSG ADAPTIC 3X8 NADH LF (GAUZE/BANDAGES/DRESSINGS) ×5 IMPLANT
ELECT CAUTERY BLADE 6.4 (BLADE) ×3 IMPLANT
ELECT REM PT RETURN 9FT ADLT (ELECTROSURGICAL) ×3
ELECTRODE REM PT RTRN 9FT ADLT (ELECTROSURGICAL) ×1 IMPLANT
EVACUATOR SILICONE 100CC (DRAIN) IMPLANT
GAUZE SPONGE 4X4 12PLY STRL (GAUZE/BANDAGES/DRESSINGS) ×6 IMPLANT
GAUZE SPONGE 4X4 12PLY STRL LF (GAUZE/BANDAGES/DRESSINGS) ×4 IMPLANT
GLOVE BIO SURGEON STRL SZ 6.5 (GLOVE) ×1 IMPLANT
GLOVE BIO SURGEON STRL SZ7 (GLOVE) ×5 IMPLANT
GLOVE BIO SURGEONS STRL SZ 6.5 (GLOVE) ×1
GLOVE BIOGEL PI IND STRL 6.5 (GLOVE) IMPLANT
GLOVE BIOGEL PI IND STRL 7.0 (GLOVE) IMPLANT
GLOVE BIOGEL PI IND STRL 7.5 (GLOVE) ×1 IMPLANT
GLOVE BIOGEL PI INDICATOR 6.5 (GLOVE) ×2
GLOVE BIOGEL PI INDICATOR 7.0 (GLOVE) ×2
GLOVE BIOGEL PI INDICATOR 7.5 (GLOVE) ×2
GOWN STRL REUS W/ TWL LRG LVL3 (GOWN DISPOSABLE) ×3 IMPLANT
GOWN STRL REUS W/TWL LRG LVL3 (GOWN DISPOSABLE) ×12
KIT BASIN OR (CUSTOM PROCEDURE TRAY) ×3 IMPLANT
KIT TURNOVER KIT B (KITS) ×3 IMPLANT
NS IRRIG 1000ML POUR BTL (IV SOLUTION) ×3 IMPLANT
PACK GENERAL/GYN (CUSTOM PROCEDURE TRAY) ×3 IMPLANT
PAD ARMBOARD 7.5X6 YLW CONV (MISCELLANEOUS) ×6 IMPLANT
STAPLER VISISTAT 35W (STAPLE) ×5 IMPLANT
STOCKINETTE IMPERVIOUS LG (DRAPES) ×5 IMPLANT
SUT ETHILON 3 0 PS 1 (SUTURE) IMPLANT
SUT SILK 0 TIES 10X30 (SUTURE) ×3 IMPLANT
SUT SILK 2 0 (SUTURE) ×6
SUT SILK 2-0 18XBRD TIE 12 (SUTURE) ×1 IMPLANT
SUT VIC AB 2-0 CT1 18 (SUTURE) ×14 IMPLANT
SUT VIC AB 3-0 SH 18 (SUTURE) IMPLANT
TOWEL GREEN STERILE (TOWEL DISPOSABLE) ×6 IMPLANT
UNDERPAD 30X30 (UNDERPADS AND DIAPERS) ×3 IMPLANT
WATER STERILE IRR 1000ML POUR (IV SOLUTION) ×3 IMPLANT

## 2017-10-08 NOTE — Transfer of Care (Signed)
Immediate Anesthesia Transfer of Care Note  Patient: [name] [name]  Procedure(s) Performed: Bilateral  ABOVE KNEE amputations (Bilateral Leg Upper)  Patient Location: PACU  Anesthesia Type:MAC and Spinal  Level of Consciousness: lethargic and responds to stimulation  Airway & Oxygen Therapy: Patient Spontanous Breathing and Patient connected to face mask oxygen  Post-op Assessment: Report given to RN  Post vital signs: Reviewed and stable  Last Vitals:  Vitals Value Taken Time  BP 90/63 10/08/2017  4:12 PM  Temp    Pulse 55 10/08/2017  4:13 PM  Resp 11 10/08/2017  4:13 PM  SpO2 100 % 10/08/2017  4:13 PM  Vitals shown include unvalidated device data.  Last Pain:  Vitals:   10/08/17 1000  TempSrc: Oral  PainSc: 0-No pain      Patients Stated Pain Goal: 2 (13/88/71 9597)  Complications: No apparent anesthesia complications

## 2017-10-08 NOTE — Interval H&P Note (Signed)
    History and Physical Update  The patient was interviewed and re-examined.  The patient's previous History and Physical has been reviewed and is unchanged from Dr. Nicole Cella consult.  There is no change in the plan of care: bilateral above-knee amputation.   I discussed in depth the nature of bilateral above-the-knee amputation with the patient, including risks, benefits, and alternatives.    The patient is aware that the risks of bilateral above-the-knee amputation include but are not limited to: bleeding, infection, myocardial infarction, stroke, death, failure to heal amputation wound, and possible need for more proximal amputation.    The patient is aware of the risks and agrees proceed forward with the procedure.   Adele Barthel, MD, FACS [facility] Office: [phone] Pager: [phone]  10/08/2017, 12:07 PM

## 2017-10-08 NOTE — Progress Notes (Signed)
PT Cancellation Note  Patient Details Name: [name] [name] MRN: [id number] DOB: 08-01-1926   Cancelled Treatment:    Reason Eval/Treat Not Completed: Patient at procedure or test/unavailable (OR). Plan for bilateral AKA. Will follow.  Ellamae Sia, PT, DPT Acute Rehabilitation Services  Pager: [phone] 10/08/2017, 1:25 PM

## 2017-10-08 NOTE — Progress Notes (Addendum)
   Subjective: Pt reports continued pain in his lower legs.  He is eager to complete the surgery.   Denies SOB.     Objective:  Vital signs in last 24 hours: Vitals:   10/07/17 0924 10/07/17 1657 10/07/17 2053 10/08/17 0526  BP: 101/62 114/68 104/70 112/76  Pulse: 85 80 77 80  Resp: 18 18 20 20   Temp: 98.2 F (36.8 C) 98.4 F (36.9 C) 98.4 F (36.9 C) 98.3 F (36.8 C)  TempSrc: Oral Oral Oral Oral  SpO2: 99% 98% 96% 98%  Weight:      Height:       Physical Exam  Constitutional: He appears well-developed and well-nourished.  Cardiovascular: Normal rate, regular rhythm, normal heart sounds and intact distal pulses. Exam reveals no gallop and no friction rub.  No murmur heard. Pulmonary/Chest: Effort normal. No respiratory distress. He has no wheezes. He has no rales (resolved).  Abdominal: Soft. Bowel sounds are normal. He exhibits no distension and no mass. There is no tenderness. There is no guarding.  Musculoskeletal: He exhibits edema (now trace to 1+).  Neurological: He is alert.  Skin:  Skin of lower extremities unchanged, much less edematous, wrapped in kerlex.      Assessment/Plan:  Active Problems:   Cellulitis   PAD (peripheral artery disease) (HCC)   Chronic renal insufficiency, stage 3 (moderate) (HCC)   HTN (hypertension), benign   Pressure injury of skin  Ulcerations of bilateral LE with surrounding cellulitis secondary to PAD: progressive process secondary to T2DM, hyperlipidemia.  Left leg worse than right. he has consented to amputation  -blood cultures without growth, will get source control today will d/c abx -appreciate palliative medicine's assistance -resumed home tramadol for pain control, requiring minimal doses -pt scheduled for bilateral AKA today. -PT to evaluate and treat   HFpEF: last ECHO 2014 showed G2DD with normal EF.  Does not appear volume overloaded on exam other than LE edema which is chronic. He is on lasix 20mg  TID at home.     -euvolemic today, will do 20mg  lasix daily and monitor   T2DM: pt states that he is borderline diabetic he is currently on no therapy for diabetes last known a1c 6.6 in 2016. A1C on admission 6.7   -will monitor for now does not appear to need correctional insulin   Gout: this was the patient's chief complaint, It is difficult to ascertain by exam whether his pain is from gout vs worsening vascular disease.  He takes allopurinol 300mg  daily and colchicine 0.6mg  daily.    -Uric acid elevated will continue allopurinol and colchicine     CKD 3: cr baseline appears to be about 1.5, pt is on losartan 25mg  daily   -will continue to monitor, improved with fluids adjusting diuretics      Dispo: Anticipated discharge in approximately 2-3 day(s).   Katherine Roan, MD 10/08/2017, 9:30 AM Vickki Muff MD PGY-1 Internal Medicine Pager # [phone]

## 2017-10-08 NOTE — Anesthesia Procedure Notes (Signed)
Spinal  Patient location during procedure: OR Start time: 10/08/2017 1:24 PM End time: 10/08/2017 2:02 PM Staffing Anesthesiologist: Oleta Mouse, MD Preanesthetic Checklist Completed: patient identified, surgical consent, pre-op evaluation, timeout performed, IV checked, risks and benefits discussed and monitors and equipment checked Spinal Block Patient position: sitting Prep: ChloraPrep and site prepped and draped Patient monitoring: heart rate, cardiac monitor, continuous pulse ox and blood pressure Approach: midline Location: L4-5 Injection technique: single-shot Needle Needle type: Pencan  Needle gauge: 24 G Needle length: 10 cm Assessment Sensory level: T8

## 2017-10-08 NOTE — Progress Notes (Signed)
MD on call paged. Patient continue to be in severe pain.

## 2017-10-08 NOTE — Anesthesia Preprocedure Evaluation (Signed)
Anesthesia Evaluation  Patient identified by MRN, date of birth, ID band Patient awake    Reviewed: Allergy & Precautions, NPO status , Patient's Chart, lab work & pertinent test results  History of Anesthesia Complications Negative for: history of anesthetic complications  Airway Mallampati: II  TM Distance: >3 FB Neck ROM: Full    Dental  (+) Dental Advisory Given   Pulmonary neg shortness of breath, neg COPD, neg recent URI, former smoker,    breath sounds clear to auscultation       Cardiovascular hypertension, Pt. on medications + Peripheral Vascular Disease and +CHF   Rhythm:Regular     Neuro/Psych negative neurological ROS  negative psych ROS   GI/Hepatic negative GI ROS, Neg liver ROS,   Endo/Other  diabetes, Type 2Morbid obesity  Renal/GU CRFRenal disease     Musculoskeletal   Abdominal   Peds  Hematology   Anesthesia Other Findings   Reproductive/Obstetrics                             Anesthesia Physical Anesthesia Plan  ASA: III  Anesthesia Plan: MAC and Spinal   Post-op Pain Management:    Induction:   PONV Risk Score and Plan: 1 and Ondansetron and Treatment may vary due to age or medical condition  Airway Management Planned: Nasal Cannula  Additional Equipment: None  Intra-op Plan:   Post-operative Plan:   Informed Consent: I have reviewed the patients History and Physical, chart, labs and discussed the procedure including the risks, benefits and alternatives for the proposed anesthesia with the patient or authorized representative who has indicated his/her understanding and acceptance.   Dental advisory given  Plan Discussed with: CRNA and Surgeon  Anesthesia Plan Comments:         Anesthesia Quick Evaluation

## 2017-10-08 NOTE — Progress Notes (Signed)
Medicine attending:  I examined this patient today together with resident physician Dr. Guadlupe Spanish and I concur with his evaluation and management plan. No acute clinical change.  Persistent pain in his lower extremities from vascular compromise.  Vital signs are stable.  Lab profile stable. Plan to proceed with planned bilateral AKA's today by vascular surgery.

## 2017-10-08 NOTE — Op Note (Signed)
    OPERATIVE NOTE   PROCEDURE: bilateral above-the-knee amputation  PRE-OPERATIVE DIAGNOSIS: bilateral leg ulcers  POST-OPERATIVE DIAGNOSIS: same as above  SURGEON: Adele Barthel, MD  ASSISTANT(S): Laurence Slate, PAC   ANESTHESIA: spinal  ESTIMATED BLOOD LOSS: 100 cc  FINDING(S): 1.  Viable muscle in both thighs 2.  Obvious muscle atrophy in right thigh  SPECIMEN(S):  bilateral above-the-knee amputation  INDICATIONS:   [name] [name] is a 84 y.o. male who presents with bilateral leg ulcers.  This patient was not felt to be a good revascularization candidate by two vascular surgeons.  Dr. Scot Dock recommended to his healthcare power of attorney proceeding with bilateral above-knee amputations.  She agreed with such.  The risks, benefits, and alternatives to this procedure were discussed with the patient's POA.  The patient's POA is aware that the risk of this operation included but are not limited to:  bleeding, infection, myocardial infarction, stroke, death, failure to heal amputation wound, and possible need for more proximal amputation.  The patient is aware of the risks and agrees proceed forward with the procedure.  DESCRIPTION: After full informed written consent was obtained from the patient, the patient was brought back to the operating room, and placed supine upon the operating table.  Prior to induction, the patient received IV antibiotics.  The patient was then prepped and draped in the standard fashion for an bilateral above-the-knee amputation.  .  After obtaining adequate anesthesia, the patient was prepped and draped in the standard fashion for a bilateral above-the-knee amputation.  I marked out the anterior and posterior flaps for a fish-mouth type of amputation on both thighs.  I placed a sterile tourniquet on the right thigh.  I exsanguinated the leg with an Esmarch bandage and then inflated the tourniquet to 250 mm Hg.   I made the incisions for these flaps, and then  dissected through the subcutaneous tissue, fascia, and muscles circumferentially.  I elevated  the periosteal tissue 4 cm more proximal than the anterior skin flap.  I then transected the femur with a power saw at this level.  Then I smoothed out the rough edges of the bone with a rasp.  At this point, the specimen was passed off the field as the above-the-knee amputation.  At this point, I clamped all visibly bleeding arteries and veins using a combination of suture ligation with Vicryl suture and electrocautery.  The tourniquet was then deflated at this point.  Bleeding continued to be controlled with electrocautery and suture ligature.  The stump was washed off with sterile normal saline and no further active bleeding was noted.  I reapproximated the anterior and posterior fascia  with interrupted stitches of 2-0 Vicryl.  This was completed along the entire length of anterior and posterior fascia until there were no more loose space in the fascial line.  The skin was then  reapproximated with staples.    This same process was repeated on the left side with tourniquet control of bleeding.  The left above-knee amputation was accomplished in the same fashion.  Both stumps were washed off and dried.  Each incision was dressed with [employer] and then fluffs were applied to each stump.  Kerlix was wrapped around each leg and then gently an ACE wrap was applied on both sides.     COMPLICATIONS: none  CONDITION: stable   Adele Barthel, MD, [facility] [place] Office: [phone] Pager: [phone]  10/08/2017, 4:02 PM

## 2017-10-09 ENCOUNTER — Encounter (HOSPITAL_COMMUNITY): Payer: Self-pay | Admitting: Vascular Surgery

## 2017-10-09 ENCOUNTER — Telehealth: Payer: Self-pay | Admitting: Vascular Surgery

## 2017-10-09 DIAGNOSIS — Z9889 Other specified postprocedural states: Secondary | ICD-10-CM

## 2017-10-09 DIAGNOSIS — Z89611 Acquired absence of right leg above knee: Secondary | ICD-10-CM

## 2017-10-09 DIAGNOSIS — Z89612 Acquired absence of left leg above knee: Secondary | ICD-10-CM

## 2017-10-09 LAB — BASIC METABOLIC PANEL
Anion gap: 11 (ref 5–15)
BUN: 19 mg/dL (ref 6–20)
CHLORIDE: 96 mmol/L — AB (ref 101–111)
CO2: 28 mmol/L (ref 22–32)
CREATININE: 1.42 mg/dL — AB (ref 0.61–1.24)
Calcium: 8.8 mg/dL — ABNORMAL LOW (ref 8.9–10.3)
GFR calc Af Amer: 49 mL/min — ABNORMAL LOW (ref >60)
GFR calc non Af Amer: 42 mL/min — ABNORMAL LOW (ref >60)
Glucose, Bld: 124 mg/dL — ABNORMAL HIGH (ref 65–99)
Potassium: 4 mmol/L (ref 3.5–5.1)
Sodium: 135 mmol/L (ref 135–145)

## 2017-10-09 LAB — CULTURE, BLOOD (ROUTINE X 2): Culture: NO GROWTH

## 2017-10-09 LAB — CBC
HCT: 38.4 % — ABNORMAL LOW (ref 39.0–52.0)
HEMOGLOBIN: 12.4 g/dL — AB (ref 13.0–17.0)
MCH: 30.2 pg (ref 26.0–34.0)
MCHC: 32.3 g/dL (ref 30.0–36.0)
MCV: 93.4 fL (ref 78.0–100.0)
Platelets: 355 10*3/uL (ref 150–400)
RBC: 4.11 MIL/uL — AB (ref 4.22–5.81)
RDW: 13.5 % (ref 11.5–15.5)
WBC: 13.8 10*3/uL — ABNORMAL HIGH (ref 4.0–10.5)

## 2017-10-09 NOTE — Progress Notes (Signed)
PT Cancellation Note  Patient Details Name: [name] [name] MRN: [id number] DOB: 1926-05-27   Cancelled Treatment:    Reason Eval/Treat Not Completed: Patient declined, no reason specified. Patient refusing mobility, stating, "maybe in another week." Denies pain. PT explained importance of mobility, although patient did not verbalize understanding. Will follow.  Ellamae Sia, PT, DPT Acute Rehabilitation Services  Pager: [phone]    Willy Eddy 10/09/2017, 12:19 PM

## 2017-10-09 NOTE — Progress Notes (Signed)
Medicine attending: I examined this patient today together with resident physician Dr. Guadlupe Spanish and I concur with his evaluation and management plan. Patient underwent uncomplicated bilateral AKA's yesterday under spinal anesthesia.  He is in remarkably good spirits today despite major surgery and loss of limbs. Vital signs are stable.  Oxygenating well on room air.  Clear lungs.  Regular cardiac rhythm. Lab profile stable and at his baseline with trend for improvement in his renal function.  Hemoglobin no change from his preoperative value of 12 g.  Nonspecific mild leukocytosis.  No fever.  Wound looks clean on the right stump.  Left stump with surgical dressing which I did not remove. We will continue to monitor his medical status as he recuperates. Physical therapy evaluation in progress.

## 2017-10-09 NOTE — Progress Notes (Signed)
   Subjective: Pt reports good pain control after the procedure, has required very little pain medicine.  Denies any shortness of breath.    Objective:  Vital signs in last 24 hours: Vitals:   10/08/17 1830 10/08/17 2006 10/09/17 0503 10/09/17 1035  BP: 106/70 130/83 118/78 116/65  Pulse:  71 90 (!) 55  Resp:  17 17 18   Temp:  97.6 F (36.4 C) 97.7 F (36.5 C) 98.7 F (37.1 C)  TempSrc:  Oral Oral Oral  SpO2:  100% 97% 98%  Weight:      Height:       Physical Exam  Constitutional: He appears well-developed and well-nourished.  Cardiovascular: Normal rate, regular rhythm, normal heart sounds and intact distal pulses. Exam reveals no gallop and no friction rub.  No murmur heard. Pulmonary/Chest: Effort normal. No respiratory distress. He has no wheezes. He has no rales.  Abdominal: Soft. Bowel sounds are normal. He exhibits no distension and no mass. There is no tenderness. There is no guarding.  Neurological: He is alert.  Skin:  Stump of right leg with no bleeding, good wound closure.  Left leg stump with new clean and dry dressing with wrap.        Assessment/Plan:  Active Problems:   Cellulitis   PAD (peripheral artery disease) (HCC)   Chronic renal insufficiency, stage 3 (moderate) (HCC)   HTN (hypertension), benign   Pressure injury of skin  Bilateral AKA: performed for infected ulcerations 2/2 progressive vascular disease as pt with T2DM, hyperlipidemia.  Left leg worse than right. amputation 6/13  -source now controlled, hgb stable -pt requiring minimal pain meds after procedure    HFpEF: last ECHO 2014 showed G2DD with normal EF.  Does not appear volume overloaded on exam other than LE edema which is chronic. He is on lasix 20mg  TID at home.     -euvolemic today, creatinine stable will continue lasix 20mg  daily   T2DM: pt states that he is borderline diabetic he is currently on no therapy for diabetes last known a1c 6.6 in 2016. A1C on admission 6.7     -will monitor for now does not appear to need correctional insulin   Gout: this was the patient's chief complaint, It is difficult to ascertain by exam whether his pain is from gout vs worsening vascular disease.  He takes allopurinol 300mg  daily and colchicine 0.6mg  daily.    -Uric acid elevated on admission will continue allopurinol and colchicine     CKD 3: cr baseline appears to be about 1.5, pt is on losartan 25mg  daily   -will continue to monitor, improved with fluids and adjusting diuretics      Dispo: Anticipated discharge in approximately 2-3 day(s).   Katherine Roan, MD 10/09/2017, 3:04 PM Vickki Muff MD PGY-1 Internal Medicine Pager # [phone]

## 2017-10-09 NOTE — Progress Notes (Addendum)
[facility] [place]  Subjective  - Awake and confused.  Very active and talkative.  Wants breakfast.   Objective 118/78 90 97.7 F (36.5 C) (Oral) 17 97%  Intake/Output Summary (Last 24 hours) at 10/09/2017 0829 Last data filed at 10/09/2017 0600 Gross per 24 hour  Intake 820 ml  Output 1200 ml  Net -380 ml    Right AKA incision clean and dry.  Patient removed dressing. Left AKA dressing clean and dry, NTTP  Assessment/Planning: POD # B AKA  Patient confused.  Right stump healing well Plan to remove left AKA dressing for wound check tomorrow. Disposition post op stable.  Roxy Horseman 10/09/2017 8:29 AM --  Laboratory Lab Results: Recent Labs    10/07/17 0519 10/08/17 0438  WBC 6.3 6.5  HGB 11.0* 11.9*  HCT 34.1* 36.7*  PLT 325 374   BMET Recent Labs    10/07/17 0519 10/08/17 0754  NA 137 137  K 3.7 3.9  CL 96* 94*  CO2 30 31  GLUCOSE 103* 100*  BUN 26* 24*  CREATININE 1.46* 1.56*  CALCIUM 8.8* 9.0    COAG Lab Results  Component Value Date   INR 1.03 10/30/2009   No results found for: PTT  Addendum  Dressings off tomorrow.  ABD to both stumps.  Stump sock to both AKA.   Adele Barthel, MD, FACS [facility] [place] Office: [phone] Pager: [phone]  10/09/2017, 1:37 PM

## 2017-10-09 NOTE — Progress Notes (Signed)
Orthopedic Tech Progress Note Patient Details:  [name] [name] 1926-05-30 [id number] Brace completed by bio-tech. Patient ID: [name] [name], male   DOB: 1926-08-05, 84 y.o.   MRN: [id number]   [name] [name] 10/09/2017, 5:15 PM

## 2017-10-09 NOTE — NC FL2 (Signed)
[place] LEVEL OF CARE SCREENING TOOL     IDENTIFICATION  Patient Name: [name] [name] Birthdate: 1927-01-09 Sex: male Admission Date (Current Location): 10/04/2017  [place] and [place] Number:  Kathleen Argue [id number] [place] and Address:  The [place]. [facility], [place] [address], [place], [place] [address]      Provider Number: [id number]  Attending Physician Name and Address:  Annia Belt, MD  Relative Name and Phone Number:  Carlous Olivares - daughter, [phone] (mobile), [phone] (home)    Current Level of Care: Hospital Recommended Level of Care: [place] Prior Approval Number:    Date Approved/Denied:   PASRR Number: [id number] K(Eff. 12/16/12)  Discharge Plan: SNF    Current Diagnoses: Patient Active Problem List   Diagnosis Date Noted  . S/P AKA (above knee amputation) bilateral ([place])   . Pressure injury of skin 10/04/2017  . Hyperkalemia 11/17/2016  . Venous stasis ulcer ([place]) 11/17/2016  . Venous stasis ulcer of lower extremity ([place]) 05/23/2015  . Cellulitis of both lower extremities 10/18/2014  . Chronic renal insufficiency, stage 3 (moderate) ([place]) 10/18/2014  . HTN (hypertension), benign 10/18/2014  . Chronic diastolic CHF (congestive heart failure) ([place]) 10/18/2014  . Lower extremity cellulitis 10/18/2014  . PAD (peripheral artery disease) ([place]) 02/02/2014  . Atherosclerosis of native arteries of the extremities with ulceration ([place]) 07/22/2013  . Acute renal failure ([place]) 11/15/2012  . Fall 11/15/2012  . Diabetic ulcer of left foot ([place]) 10/15/2012  . Diabetes mellitus ([place]) 10/15/2012  . Lymphadenitis 08/07/2012  . Cellulitis 08/07/2012  . HTN (hypertension) 08/05/2012  . Renal insufficiency 08/05/2012  . Tobacco abuse 08/05/2012  . Gout 08/05/2012  . Hyperglycemia 08/05/2012    Orientation RESPIRATION BLADDER Height & Weight     Self, Time, Situation, Place  Normal Continent, External  catheter(placed 10/08/17) Weight: (bed said error) Height:  5\' 10"  (177.8 cm)  BEHAVIORAL SYMPTOMS/MOOD NEUROLOGICAL BOWEL NUTRITION STATUS      Continent Diet(Heart healthy diet)  AMBULATORY STATUS COMMUNICATION OF NEEDS Skin   Total Care(Bilateral amputation) Verbally Surgical wounds                       Personal Care Assistance Level of Assistance  Bathing, Feeding, Dressing Bathing Assistance: Maximum assistance Feeding assistance: Independent Dressing Assistance: Maximum assistance     Functional Limitations Info  Sight, Hearing, Speech Sight Info: Adequate Hearing Info: Impaired Speech Info: Adequate    SPECIAL CARE FACTORS FREQUENCY  PT (By licensed PT)     PT Frequency: Evaluated 6/11 prior to amputation. (PT will work with patient post amputation)              Contractures Contractures Info: Not present    Additional Factors Info  Code Status, Allergies Code Status Info: Full Allergies Info: No known allergies           Current Medications (10/09/2017):  This is the current hospital active medication list Current Facility-Administered Medications  Medication Dose Route Frequency Provider Last Rate Last Dose  . allopurinol (ZYLOPRIM) tablet 300 mg  300 mg Oral Daily Laurence Slate M, PA-C   300 mg at 10/09/17 1056  . Chlorhexidine Gluconate Cloth 2 % PADS 6 each  6 each Topical Daily Ulyses Amor, PA-C   6 each at 10/07/17 2134  . colchicine tablet 0.6 mg  0.6 mg Oral Daily Laurence Slate M, PA-C   0.6 mg at 10/09/17 1056  . enoxaparin (LOVENOX) injection 40 mg  40  mg Subcutaneous Q24H Katherine Roan, MD   40 mg at 10/09/17 1052  . furosemide (LASIX) tablet 20 mg  20 mg Oral Daily Laurence Slate M, PA-C   20 mg at 10/09/17 1412  . gabapentin (NEURONTIN) capsule 100 mg  100 mg Oral BID BM Laurence Slate M, PA-C   100 mg at 10/09/17 1412  . gabapentin (NEURONTIN) capsule 300 mg  300 mg Oral QHS Laurence Slate M, PA-C   300 mg at 10/08/17 2127  .  HYDROmorphone (DILAUDID) injection 0.5 mg  0.5 mg Intravenous Q3H PRN Nedrud, Larena Glassman, MD   0.5 mg at 10/08/17 2233  . lactated ringers infusion   Intravenous Continuous Ulyses Amor, PA-C 10 mL/hr at 10/08/17 1258    . multivitamin with minerals tablet 1 tablet  1 tablet Oral Daily Ulyses Amor, PA-C   1 tablet at 10/09/17 1056  . mupirocin ointment (BACTROBAN) 2 % 1 application  1 application Nasal BID Ulyses Amor, PA-C   1 application at 93/23/55 1052  . nutrition supplement (JUVEN) (JUVEN) powder packet 1 packet  1 packet Oral BID BM Ulyses Amor, PA-C   1 packet at 10/09/17 1329  . polyethylene glycol (MIRALAX / GLYCOLAX) packet 17 g  17 g Oral Daily Laurence Slate M, PA-C   17 g at 10/09/17 1053  . protein supplement (PREMIER PROTEIN) liquid  11 oz Oral Q24H Laurence Slate M, [place]   11 oz at 10/06/17 2117  . sodium chloride (OCEAN) 0.65 % nasal spray 1-2 spray  1-2 spray Each Nare PRN Ulyses Amor, PA-C      . traMADol Veatrice Bourbon) tablet 100 mg  100 mg Oral TID PRN Ulyses Amor, PA-C   100 mg at 10/08/17 2012  . verapamil (CALAN-SR) CR tablet 180 mg  180 mg Oral Daily Laurence Slate M, PA-C   180 mg at 10/09/17 1056     Discharge Medications: Please see discharge summary for a list of discharge medications.  Relevant Imaging Results:  Relevant Lab Results:   Additional Information ss#[id number].   Sable Feil, LCSW

## 2017-10-09 NOTE — Telephone Encounter (Signed)
sch appt phone NA 10/09/17 145pm p/o MD

## 2017-10-09 NOTE — Clinical Social Work Note (Addendum)
Clinical Social Work Assessment  Patient Details  Name: [name] [name] MRN: [id number] Date of Birth: [birth year]  Date of referral:  10/09/17               Reason for consult:  Facility Placement                Permission sought to share information with:  Family Supports Permission granted to share information::  Yes, Verbal Permission Granted  Name::     Vail Vuncannon  Agency::     Relationship::  Daughter (not biological daughter, but based on relationship)  Contact Information:  [phone] [place]) or [phone]  Housing/Transportation Living arrangements for the past 2 months:  Apartment Source of Information:  Other (Comment Required)(Ms. Truman Hayward) Patient Interpreter Needed:  None Criminal Activity/Legal Involvement Pertinent to Current Situation/Hospitalization:  No - Comment as needed Significant Relationships:  Other(Comment)(Ms. Truman Hayward) Lives with:  Self Do you feel safe going back to the place where you live?  No Need for family participation in patient care:  Yes (Comment)  Care giving concerns: Ms. Dawood indicated that Mr. Polinski can no longer live alone and she has given up his apartment at [facility] and he will need LTC placement after rehab.  Social Worker assessment / plan:  CSW visited with patient and he gave CSW permission to contact Ms. Truman Hayward as he did not want to talk with CSW. Call made to Ms. Truman Hayward. She advised CSW that patient has no children, she is like a daughter to him and has been with him for 7 years, starting off as his aide. CSW informed that patient's nephew is as old as Mr. Hehn and is not involved and he has no one else.  Ms. Lekas was aware that patient would need ST rehab and LTC placement as he can no longer live by himself, and she has given up his apartment at [place] apartments.  LTC placement after ST rehab discussed with Ms. Litke. SNF placement discussed and SNF list emailed to Ms. Lacina ([email or web address]).  **Ms. Pfiffner also  talked with CSW about HCPOA for patient as she and patient have talked about it and this was discussed with Ms. Cuffe. She is aware that patient is not able to complete paperwork right now, but she will talk with him about this later and have it completed post-discharge. HCPOA paperwork left in room for Ms. Truman Hayward.   Employment status:  Retired Forensic scientist:  Information systems manager, Medicaid In [place] PT Recommendations:  Not assessed at this time(Double amputation this visit - will need SNF) Information / Referral to community resources:  Skilled Nursing Facility(List emailed to Ms. Laur: [email or web address])  Patient/Family's Response to care:  No concerns expressed by Ms. Tamplin regarding patient's care during hospitalization.  Patient/Family's Understanding of and Emotional Response to Diagnosis, Current Treatment, and Prognosis:  Ms. Forgette reported that she and patient talked about the change in his living situation because of the double amputation and he no longer being able to live independently.  Emotional Assessment Appearance:  Appears stated age Attitude/Demeanor/Rapport:  Other(Patient was irritable and did not want to talk with CSW ) Affect (typically observed):  Irritable Orientation:  Oriented to Self, Oriented to Place, Oriented to  Time, Oriented to Situation Alcohol / Substance use:  Tobacco Use, Alcohol Use, Illicit Drugs(Patient reported that he quit smoking and does not drink or use illicit drugs) Psych involvement (Current and /or in the community):  No (Comment)  Discharge Needs  Concerns  to be addressed:  Discharge Planning Concerns Readmission within the last 30 days:  No Current discharge risk:  Lack of support system, Homeless(Per Tami, she has given up patient's apartment, as he can no longer live alone and care for himself) Barriers to Discharge:  Continued Medical Work up   [employer] Mila Homer, [place] 10/09/2017, 4:06 PM

## 2017-10-09 NOTE — Anesthesia Postprocedure Evaluation (Signed)
Anesthesia Post Note  Patient: [name] [name]  Procedure(s) Performed: Bilateral  ABOVE KNEE amputations (Bilateral Leg Upper)     Patient location during evaluation: PACU Anesthesia Type: MAC and Spinal Level of consciousness: awake and alert Pain management: pain level controlled Vital Signs Assessment: post-procedure vital signs reviewed and stable Respiratory status: spontaneous breathing, nonlabored ventilation, respiratory function stable and patient connected to nasal cannula oxygen Cardiovascular status: stable and blood pressure returned to baseline Postop Assessment: no apparent nausea or vomiting Anesthetic complications: no    Last Vitals:  Vitals:   10/09/17 0503 10/09/17 1035  BP: 118/78 116/65  Pulse: 90 (!) 55  Resp: 17 18  Temp: 36.5 C 37.1 C  SpO2: 97% 98%    Last Pain:  Vitals:   10/09/17 1100  TempSrc:   PainSc: 0-No pain                 Wiktoria Hemrick

## 2017-10-09 NOTE — Progress Notes (Signed)
Orthopedic Tech Progress Note Patient Details:  [name] [name] [birth year] [id number]  Patient ID: [name] [name], male   DOB: May 30, 1926, 84 y.o.   MRN: [id number]   [name] [name] 10/09/2017, 2:06 PM Called in bio-tech brace order; spoke with [facility]

## 2017-10-10 ENCOUNTER — Other Ambulatory Visit: Payer: Self-pay

## 2017-10-10 LAB — CBC
HCT: 37.3 % — ABNORMAL LOW (ref 39.0–52.0)
Hemoglobin: 11.8 g/dL — ABNORMAL LOW (ref 13.0–17.0)
MCH: 29.9 pg (ref 26.0–34.0)
MCHC: 31.6 g/dL (ref 30.0–36.0)
MCV: 94.4 fL (ref 78.0–100.0)
PLATELETS: 337 10*3/uL (ref 150–400)
RBC: 3.95 MIL/uL — AB (ref 4.22–5.81)
RDW: 13.8 % (ref 11.5–15.5)
WBC: 11.9 10*3/uL — ABNORMAL HIGH (ref 4.0–10.5)

## 2017-10-10 LAB — BASIC METABOLIC PANEL
Anion gap: 9 (ref 5–15)
BUN: 22 mg/dL — AB (ref 6–20)
CHLORIDE: 95 mmol/L — AB (ref 101–111)
CO2: 32 mmol/L (ref 22–32)
CREATININE: 1.45 mg/dL — AB (ref 0.61–1.24)
Calcium: 8.8 mg/dL — ABNORMAL LOW (ref 8.9–10.3)
GFR calc Af Amer: 48 mL/min — ABNORMAL LOW (ref >60)
GFR, EST NON AFRICAN AMERICAN: 41 mL/min — AB (ref >60)
GLUCOSE: 124 mg/dL — AB (ref 65–99)
POTASSIUM: 3.9 mmol/L (ref 3.5–5.1)
SODIUM: 136 mmol/L (ref 135–145)

## 2017-10-10 MED ORDER — HYDROCERIN EX CREA
TOPICAL_CREAM | Freq: Two times a day (BID) | CUTANEOUS | Status: DC
  Administered 2017-10-10 – 2017-10-12 (×5): via TOPICAL
  Filled 2017-10-10: qty 113

## 2017-10-10 NOTE — Progress Notes (Signed)
Physical TherapyRe- Evaluation Patient Details Name: Court Gracia MRN: [id number] DOB: 28-Jun-1926 Today's Date: 10/10/2017   History of Present Illness  84 year old man with advanced peripheral vascular disease.  Bilateral amputation has been perfromed on 10/08/2017 .  PMH: hypertension, type 2 diabetes currently on diet alone, gout on allopurinol and colchicine, advanced to psoriasis, stage III chronic renal insufficiency.  Patient admitted due to pain in Bilateral LE's he attributes to gout. On   Clinical Impression  Patient was willing to participate today with therapy and attempted to transfer to the edge of the bed but was limited by pain. Therapy cued him how to use his arms to move his hips but he had pain in his right arm and bilateral residual limbs. Therapy gave him total assist to scoot but it caused the patient too much pain. He would benefit from continued acute physical therapy as well as rehab at a SNF.     Follow Up Recommendations SNF    Equipment Recommendations  None recommended by PT    Recommendations for Other Services OT consult     Precautions / Restrictions Precautions Precautions: Fall Restrictions Weight Bearing Restrictions: No      Mobility  Bed Mobility Overal bed mobility: Needs Assistance Bed Mobility: Supine to Sit;Sit to Supine     Supine to sit: Total assist Sit to supine: Total assist   General bed mobility comments: Patient required total assit to scoot to the edge of the bed. therapy gave him max cuig on how to use his arms to scoot but the patient reported he was in too much pain   Transfers Overall transfer level: Needs assistance               General transfer comment: Patient unable to transfer at this time using slide board. He was in too much pain moving to the edge of the bed.  Ambulation/Gait                Stairs            Wheelchair Mobility    Modified Rankin (Stroke Patients Only)       Balance  Overall balance assessment: Needs assistance Sitting-balance support: Bilateral upper extremity supported Sitting balance-Leahy Scale: Poor                                       Pertinent Vitals/Pain Pain Assessment: Faces Faces Pain Scale: Hurts whole lot Pain Location: Bilateral residual limbs Pain Descriptors / Indicators: Grimacing;Guarding;Aching;Sore Pain Intervention(s): Limited activity within patient's tolerance;Monitored during session;Premedicated before session    [place] expects to be discharged to:: Private residence Living Arrangements: Alone Available Help at Discharge: Personal care attendant Type of Home: Apartment Home Access: Level entry     Home Layout: One level Home Equipment: Transport planner;Wheelchair - manual;Toilet riser Additional Comments: has walker with 4 wheels on a silver frame    Prior Function Level of Independence: Needs assistance      ADL's / Homemaking Assistance Needed: aide helps with bath and meals  Comments: was able to transfer prior to his surgery      Hand Dominance   Dominant Hand: Right    Extremity/Trunk Assessment   Upper Extremity Assessment RUE Deficits / Details: can move both arms but limited strength and pain with movement of the right arm     Lower Extremity Assessment Lower Extremity Assessment: RLE  deficits/detail;LLE deficits/detail RLE Deficits / Details: minimal movement of residual limb  RLE: Unable to fully assess due to pain LLE Deficits / Details: minimal movement of residual limb  LLE: Unable to fully assess due to pain    Cervical / Trunk Assessment Cervical / Trunk Assessment: Kyphotic;Other exceptions  Communication      Cognition Arousal/Alertness: Awake/alert Behavior During Therapy: WFL for tasks assessed/performed Overall Cognitive Status: Within Functional Limits for tasks assessed                                        General  Comments General comments (skin integrity, edema, etc.): bilateral residual limbs wrapped     Exercises     Assessment/Plan    PT Assessment Patient needs continued PT services  PT Problem List         PT Treatment Interventions DME instruction;Functional mobility training;Balance training;Patient/family education;Wheelchair mobility training;Therapeutic activities;Therapeutic exercise    PT Goals (Current goals can be found in the Care Plan section)  Acute Rehab PT Goals Patient Stated Goal: to move when he has less pain  PT Goal Formulation: With patient Time For Goal Achievement: 10/20/17 Potential to Achieve Goals: Fair    Frequency Min 3X/week   Barriers to discharge Decreased caregiver support      Co-evaluation               AM-PAC PT "6 Clicks" Daily Activity  Outcome Measure Difficulty turning over in bed (including adjusting bedclothes, sheets and blankets)?: Unable Difficulty moving from lying on back to sitting on the side of the bed? : Unable Difficulty sitting down on and standing up from a chair with arms (e.g., wheelchair, bedside commode, etc,.)?: Unable Help needed moving to and from a bed to chair (including a wheelchair)?: Total Help needed walking in hospital room?: Total Help needed climbing 3-5 steps with a railing? : Total 6 Click Score: 6    End of Session   Activity Tolerance: Patient limited by pain Patient left: in bed;with call bell/phone within reach Nurse Communication: Mobility status PT Visit Diagnosis: Other abnormalities of gait and mobility (R26.89);Pain;Muscle weakness (generalized) (M62.81) Pain - Right/Left: Right(bilateral) Pain - part of body: Leg    Time: 1115-1130 PT Time Calculation (min) (ACUTE ONLY): 15 min   Charges:   PT Evaluation $PT Eval Moderate Complexity: 1 Mod     PT G Codes:   PT G-Codes **NOT FOR INPATIENT CLASS** Functional Assessment Tool Used: AM-PAC 6 Clicks Basic Mobility     Carney Living PT DPT  10/10/2017, 1:04 PM

## 2017-10-10 NOTE — Progress Notes (Signed)
Medicine attending: Clinical status and database reviewed with resident physician Dr. Kathi Ludwig and I concur with his evaluation and management plan which we discussed together. The patient remained stable now 48 hours post bilateral above the knee amputations for advanced peripheral arterial disease with developing ulcers of his toes.  Persistent pain in his legs and feet.  He did remarkably well with the surgery.  Currently recuperating.  Left stump dressing removed today and wound inspected by surgery.  He is stable for discharge when a extended care facility bed opens. Hemoglobin remained stable at 12 g.  Renal function stable with creatinine at his baseline 1.5.

## 2017-10-10 NOTE — Progress Notes (Signed)
  Progress Note    10/10/2017 8:47 AM 2 Days Post-Op  Subjective: No acute issues  Vitals:   10/10/17 0511 10/10/17 0811  BP: 108/73 94/62  Pulse: 80 94  Resp: 14 16  Temp: 99.4 F (37.4 C) 98.6 F (37 C)  SpO2: 95% 93%    Physical Exam: Bilateral above-knee amputation sites are clean dry and intact with staples  CBC    Component Value Date/Time   WBC 11.9 (H) 10/10/2017 0542   RBC 3.95 (L) 10/10/2017 0542   HGB 11.8 (L) 10/10/2017 0542   HCT 37.3 (L) 10/10/2017 0542   PLT 337 10/10/2017 0542   MCV 94.4 10/10/2017 0542   MCH 29.9 10/10/2017 0542   MCHC 31.6 10/10/2017 0542   RDW 13.8 10/10/2017 0542   LYMPHSABS 1.0 11/18/2016 0457   MONOABS 0.7 11/18/2016 0457   EOSABS 0.1 11/18/2016 0457   BASOSABS 0.0 11/18/2016 0457    BMET    Component Value Date/Time   NA 136 10/10/2017 0542   K 3.9 10/10/2017 0542   CL 95 (L) 10/10/2017 0542   CO2 32 10/10/2017 0542   GLUCOSE 124 (H) 10/10/2017 0542   BUN 22 (H) 10/10/2017 0542   CREATININE 1.45 (H) 10/10/2017 0542   CALCIUM 8.8 (L) 10/10/2017 0542   GFRNONAA 41 (L) 10/10/2017 0542   GFRAA 48 (L) 10/10/2017 0542    INR    Component Value Date/Time   INR 1.03 10/30/2009 1934     Intake/Output Summary (Last 24 hours) at 10/10/2017 0847 Last data filed at 10/10/2017 0600 Gross per 24 hour  Intake 1080 ml  Output 700 ml  Net 380 ml     Assessment:  84 y.o. male is s/p bilateral above-knee amputations for nonhealing ulcers  Plan: Stump socks to both sides with ABDs Okay for discharge from vascular standpoint when medically stable.   Brandon C. Donzetta Matters, MD [facility] Office: [phone] Pager: [phone]  10/10/2017 8:47 AM

## 2017-10-10 NOTE — Progress Notes (Signed)
Subjective: The patient was resting in his bed today upon entering the room. He denied pain today. Denied other acute complaints including fever, chills, nausea, vomiting, myalgias, headaches or chest pain. He is in agreement with being discharged to a skilled nursing facility when able.  Objective:  Vital signs in last 24 hours: Vitals:   10/09/17 1035 10/09/17 1723 10/09/17 2021 10/10/17 0511  BP: 116/65 116/81 94/70 108/73  Pulse: (!) 55 82 89 80  Resp: 18 18 14 14   Temp: 98.7 F (37.1 C) 98.9 F (37.2 C) 99.4 F (37.4 C) 99.4 F (37.4 C)  TempSrc: Oral Oral Oral Oral  SpO2: 98% 94% 96% 95%  Weight:      Height:       Physical Exam  Constitutional: He is oriented to person, place, and time. He appears well-developed and well-nourished. No distress.  HENT:  Head: Normocephalic and atraumatic.  Cardiovascular: Normal rate and regular rhythm.  No murmur heard. Pulmonary/Chest: Effort normal and breath sounds normal. No stridor. No respiratory distress.  Musculoskeletal: He exhibits tenderness (To patlpation of the right thigh. No associated erythema or edema noted) and deformity (Bilateral AKA's). He exhibits no edema.  Neurological: He is alert and oriented to person, place, and time.  Skin: He is not diaphoretic.  Psychiatric: He has a normal mood and affect.  Vitals reviewed.  Assessment/Plan:  Active Problems:   Cellulitis   PAD (peripheral artery disease) (HCC)   Chronic renal insufficiency, stage 3 (moderate) (HCC)   HTN (hypertension), benign   Pressure injury of skin   S/P AKA (above knee amputation) bilateral ([place])  Assessment: [name] [name] is an 84 yo M w/ a PMHx notable for PAD, T2DM, and gout who presented with bilateral lower extremity pain.  Given the patient's severe peripheral arterial disease bilaterally with notable superficial epidermal and dermal skin necrosis to the knees and having failed extensive outpatient treatment and conservative  managementthe  patient agreed to bilateral amputations and underwent AKA's on 10/08/2017.  Patient will need physical rehabilitation and skilled nursing facility on discharge.   Bilateral AKA:  Performed for infected ulcerations 2/2 progressive vascular disease as pt with T2DM, hyperlipidemia.Left leg worse than right.amputations 6/13 -source now controlled, hgb stable -pt requiring minimal pain meds after procedure -CBC with stable hemoglobin 11.8 and WBC count of 11.9 down from 13.8 the prior day  HFpEF: Patient's most recent ECHO and 2014 demonstrated G2DD with normal LVEF. Does not appear volume overloaded on exam other than LE edema which is chronic. He is on lasix 20mg  TID at home.  -Continue Lasix 20 mg daily  T2DM: pt states that he is borderline diabetic he is currently on no therapy for diabetes last known A1c 6.6% in 2016. A1C on admission 6.7%. -Patient has not needed significant doses of insulin during admission.   Gout:  Patient initially presented to the emergency department with pain concerning for gout.  It was however difficult to ascertain by exam whether his pain is from gout vs worsening vascular disease.  He is previously prescribed allopurinol 300mg  daily and colchicine 0.6mg  daily with which he endorsed compliance. -Uric acid elevated on admission will continue allopurinol and colchicine  CKD 3:  cr baseline appears to be about 1.5 again today, pt is on losartan 25mg  daily at home but given his soft pressures this is being held. -will continue to monitor, improved with fluids and adjusting diuretics   Dispo: Anticipated discharge in approximately 1-2 day(s).   Kathi Ludwig, MD  10/10/2017, 6:43 AM Pager: Pager# [phone]

## 2017-10-11 LAB — BASIC METABOLIC PANEL
Anion gap: 8 (ref 5–15)
BUN: 30 mg/dL — AB (ref 6–20)
CALCIUM: 8.5 mg/dL — AB (ref 8.9–10.3)
CO2: 32 mmol/L (ref 22–32)
CREATININE: 1.37 mg/dL — AB (ref 0.61–1.24)
Chloride: 95 mmol/L — ABNORMAL LOW (ref 101–111)
GFR calc Af Amer: 51 mL/min — ABNORMAL LOW (ref >60)
GFR calc non Af Amer: 44 mL/min — ABNORMAL LOW (ref >60)
Glucose, Bld: 114 mg/dL — ABNORMAL HIGH (ref 65–99)
Potassium: 3.9 mmol/L (ref 3.5–5.1)
Sodium: 135 mmol/L (ref 135–145)

## 2017-10-11 LAB — CBC
HEMATOCRIT: 34.8 % — AB (ref 39.0–52.0)
HEMOGLOBIN: 11.1 g/dL — AB (ref 13.0–17.0)
MCH: 29.8 pg (ref 26.0–34.0)
MCHC: 31.9 g/dL (ref 30.0–36.0)
MCV: 93.5 fL (ref 78.0–100.0)
Platelets: 300 10*3/uL (ref 150–400)
RBC: 3.72 MIL/uL — ABNORMAL LOW (ref 4.22–5.81)
RDW: 13.9 % (ref 11.5–15.5)
WBC: 9.6 10*3/uL (ref 4.0–10.5)

## 2017-10-11 MED ORDER — RAMELTEON 8 MG PO TABS
8.0000 mg | ORAL_TABLET | Freq: Every day | ORAL | Status: DC
  Administered 2017-10-12: 8 mg via ORAL
  Filled 2017-10-11 (×3): qty 1

## 2017-10-11 NOTE — Progress Notes (Signed)
Orthopedic Tech Progress Note Patient Details:  [name] [name] May 07, 1926 [id number]  Patient ID: [name] [name], male   DOB: November 27, 1926, 84 y.o.   MRN: [id number]   [name] 10/11/2017, 5:56 PMCALLED  BIO-TECH FOR REPLACEMENT BILATERAL STUMP SOCKS.

## 2017-10-11 NOTE — Progress Notes (Signed)
  Progress Note    10/11/2017 9:47 AM 3 Days Post-Op  Subjective: No acute issues  Vitals:   10/11/17 0639 10/11/17 0812  BP: 100/60 (!) 99/56  Pulse: 75 74  Resp: 16 16  Temp: 98 F (36.7 C) 98.9 F (37.2 C)  SpO2: 95% 98%    Physical Exam: No acute distress Nonlabored respirations Bilateral above-knee amputation sites are clean dry and intact with staples in place  CBC    Component Value Date/Time   WBC 9.6 10/11/2017 0819   RBC 3.72 (L) 10/11/2017 0819   HGB 11.1 (L) 10/11/2017 0819   HCT 34.8 (L) 10/11/2017 0819   PLT 300 10/11/2017 0819   MCV 93.5 10/11/2017 0819   MCH 29.8 10/11/2017 0819   MCHC 31.9 10/11/2017 0819   RDW 13.9 10/11/2017 0819   LYMPHSABS 1.0 11/18/2016 0457   MONOABS 0.7 11/18/2016 0457   EOSABS 0.1 11/18/2016 0457   BASOSABS 0.0 11/18/2016 0457    BMET    Component Value Date/Time   NA 136 10/10/2017 0542   K 3.9 10/10/2017 0542   CL 95 (L) 10/10/2017 0542   CO2 32 10/10/2017 0542   GLUCOSE 124 (H) 10/10/2017 0542   BUN 22 (H) 10/10/2017 0542   CREATININE 1.45 (H) 10/10/2017 0542   CALCIUM 8.8 (L) 10/10/2017 0542   GFRNONAA 41 (L) 10/10/2017 0542   GFRAA 48 (L) 10/10/2017 0542    INR    Component Value Date/Time   INR 1.03 10/30/2009 1934     Intake/Output Summary (Last 24 hours) at 10/11/2017 0947 Last data filed at 10/11/2017 0600 Gross per 24 hour  Intake 0 ml  Output 600 ml  Net -600 ml     Assessment:  84 y.o. male is s/p bilateral above-knee amputations for nonhealing ulceration  Plan: Okay for discharge from vascular standpoint when medically stable We will follow-up for staple removal with Dr. Bridgett Larsson on 7.17.   Kaytlynn Kochan C. Donzetta Matters, MD [facility] Office: [phone] Pager: [phone]  10/11/2017 9:47 AM

## 2017-10-11 NOTE — Progress Notes (Signed)
Medicine attending: I examined this patient today together with resident physician Dr. Ledell Noss and I concur with her evaluation and management plan. He continues to do well postop day 3 bilateral above the knee amputations.  Wounds look clean and are healing well.  Surgical staples in place. Lab profile stable and at his baseline. Blood pressure running low. I am not sure why he is on Calan SR but if it is for blood pressure control and not for arrhythmia control, we can stop it. Waiting for a extended care facility bed opening.  Otherwise stable for discharge.

## 2017-10-11 NOTE — Progress Notes (Addendum)
   Subjective: [name] [name] is feeling well today. He has no new concerns or complaints. His pain has been well controlled.   Objective:  Vital signs in last 24 hours: Vitals:   10/10/17 1652 10/10/17 2132 10/11/17 0639 10/11/17 0812  BP: (!) 88/69 (!) 180/162 100/60 (!) 99/56  Pulse: (!) 55 99 75 74  Resp: 18 17 16 16   Temp: 98.4 F (36.9 C) 98.6 F (37 C) 98 F (36.7 C) 98.9 F (37.2 C)  TempSrc: Oral Oral Oral Oral  SpO2: 94% 97% 95% 98%  Weight:      Height:       General: no acute distress  Cardiac: regular rate and rhythm, normal S1 and S2, no murmurs appreciated  Pulm: normal work of breathing, no wheezes or rhonchi  MSK: B/l AKA with staples overlying amputation sites, some tenderness to palpation of the right stump   Assessment/Plan:  Active Problems:   Cellulitis   PAD (peripheral artery disease) (HCC)   Chronic renal insufficiency, stage 3 (moderate) (HCC)   HTN (hypertension), benign   Pressure injury of skin   S/P AKA (above knee amputation) bilateral (HCC)  Bilateral AKA: Performed for infected ulcerations 2/2progressivevascular disease as pt withT2DM, hyperlipidemia.Left leg worse than right.amputations 6/13 -source now controlled, hgb stable at 11 -pt has required no pain meds post procedure  HFpEF: ECHO 2014 demonstrated G2DD with normal LVEF. Remains euvolemic on exam. He is on lasix 20mg  TID at home.  - Blood pressure is low normotensive today, will hold lasix   T2DM:  A1C on admission 6.7%. Fasting glucose 114 this AM  -continue to monitor without insulin this admission   Gout:  He is previously prescribed allopurinol 300mg  daily and colchicine 0.6mg  daily with which he endorsed compliance. -Uric acid elevatedon admissionwill continue allopurinol and colchicine  CKD 3 HTN  cr baseline appears to be about 1.5 again today, pt is on losartan 25mg  daily and verapamil at home but given his soft pressures this is being held. -will  continue to monitor  Dispo: Anticipated discharge when a SNF bed becomes available   Ledell Noss, MD 10/11/2017, 11:43 AM Pager: [phone]

## 2017-10-11 NOTE — Progress Notes (Signed)
Patient's belongings obtained from security. Counted patient's money upon return to room with Gwenlyn Perking, RN , the patient, the patient's niece, and myself. Patient was returned $780.00. Dorthey Sawyer, RN

## 2017-10-12 DIAGNOSIS — Z89612 Acquired absence of left leg above knee: Secondary | ICD-10-CM

## 2017-10-12 DIAGNOSIS — M109 Gout, unspecified: Secondary | ICD-10-CM

## 2017-10-12 DIAGNOSIS — E1151 Type 2 diabetes mellitus with diabetic peripheral angiopathy without gangrene: Secondary | ICD-10-CM

## 2017-10-12 DIAGNOSIS — E1122 Type 2 diabetes mellitus with diabetic chronic kidney disease: Secondary | ICD-10-CM

## 2017-10-12 DIAGNOSIS — R0989 Other specified symptoms and signs involving the circulatory and respiratory systems: Secondary | ICD-10-CM

## 2017-10-12 DIAGNOSIS — E785 Hyperlipidemia, unspecified: Secondary | ICD-10-CM

## 2017-10-12 DIAGNOSIS — Z79899 Other long term (current) drug therapy: Secondary | ICD-10-CM

## 2017-10-12 DIAGNOSIS — Z7401 Bed confinement status: Secondary | ICD-10-CM

## 2017-10-12 DIAGNOSIS — Z89611 Acquired absence of right leg above knee: Secondary | ICD-10-CM

## 2017-10-12 DIAGNOSIS — Z89512 Acquired absence of left leg below knee: Secondary | ICD-10-CM

## 2017-10-12 DIAGNOSIS — Z89511 Acquired absence of right leg below knee: Secondary | ICD-10-CM

## 2017-10-12 LAB — BASIC METABOLIC PANEL
Anion gap: 7 (ref 5–15)
BUN: 28 mg/dL — AB (ref 6–20)
CALCIUM: 8.4 mg/dL — AB (ref 8.9–10.3)
CO2: 32 mmol/L (ref 22–32)
CREATININE: 1.29 mg/dL — AB (ref 0.61–1.24)
Chloride: 96 mmol/L — ABNORMAL LOW (ref 101–111)
GFR calc Af Amer: 55 mL/min — ABNORMAL LOW (ref >60)
GFR, EST NON AFRICAN AMERICAN: 47 mL/min — AB (ref >60)
GLUCOSE: 117 mg/dL — AB (ref 65–99)
Potassium: 4.1 mmol/L (ref 3.5–5.1)
Sodium: 135 mmol/L (ref 135–145)

## 2017-10-12 LAB — CBC
HCT: 36.4 % — ABNORMAL LOW (ref 39.0–52.0)
Hemoglobin: 11.6 g/dL — ABNORMAL LOW (ref 13.0–17.0)
MCH: 30.4 pg (ref 26.0–34.0)
MCHC: 31.9 g/dL (ref 30.0–36.0)
MCV: 95.3 fL (ref 78.0–100.0)
PLATELETS: 326 10*3/uL (ref 150–400)
RBC: 3.82 MIL/uL — ABNORMAL LOW (ref 4.22–5.81)
RDW: 13.9 % (ref 11.5–15.5)
WBC: 7.6 10*3/uL (ref 4.0–10.5)

## 2017-10-12 MED ORDER — FUROSEMIDE 40 MG PO TABS
20.0000 mg | ORAL_TABLET | Freq: Every day | ORAL | Status: DC
  Administered 2017-10-12 – 2017-10-13 (×2): 20 mg via ORAL
  Filled 2017-10-12 (×2): qty 1

## 2017-10-12 NOTE — Progress Notes (Signed)
   VASCULAR SURGERY ASSESSMENT & PLAN:   4 Days Post-Op s/p: Bilat AKA's  Both sites look fine.   We will arrange staple removal in 1 month   SUBJECTIVE:   No complaints  PHYSICAL EXAM:   Vitals:   10/11/17 1741 10/11/17 2145 10/12/17 0605 10/12/17 0955  BP: 100/65 100/64 105/70 99/66  Pulse: 70 74 70 74  Resp: 20 20 20 18   Temp: 98.9 F (37.2 C) 99.5 F (37.5 C) 98.3 F (36.8 C) 97.8 F (36.6 C)  TempSrc: Oral Oral Oral Oral  SpO2: 95% 97% 97% 97%  Weight:      Height:       Both AKA's look fine   LABS:   Lab Results  Component Value Date   WBC 7.6 10/12/2017   HGB 11.6 (L) 10/12/2017   HCT 36.4 (L) 10/12/2017   MCV 95.3 10/12/2017   PLT 326 10/12/2017   Lab Results  Component Value Date   CREATININE 1.29 (H) 10/12/2017    PROBLEM LIST:    Active Problems:   Cellulitis   PAD (peripheral artery disease) (HCC)   Chronic renal insufficiency, stage 3 (moderate) (HCC)   HTN (hypertension), benign   Pressure injury of skin   S/P AKA (above knee amputation) bilateral (HCC)   CURRENT MEDS:   . allopurinol  300 mg Oral Daily  . colchicine  0.6 mg Oral Daily  . enoxaparin (LOVENOX) injection  40 mg Subcutaneous Q24H  . furosemide  20 mg Oral Daily  . gabapentin  100 mg Oral BID BM  . gabapentin  300 mg Oral QHS  . hydrocerin   Topical BID  . multivitamin with minerals  1 tablet Oral Daily  . nutrition supplement (JUVEN)  1 packet Oral BID BM  . polyethylene glycol  17 g Oral Daily  . protein supplement shake  11 oz Oral Q24H  . ramelteon  8 mg Oral QHS    [name] Beeper: [phone] Office: [phone] 10/12/2017

## 2017-10-12 NOTE — Progress Notes (Signed)
   Patient was working with PT on my examination today.  He has follow-up scheduled with Dr. Bridgett Larsson next month for staple removal.  Please call with any questions.  Dushaun Okey C. Donzetta Matters, MD [facility] Office: [phone] Pager: [phone]

## 2017-10-12 NOTE — Progress Notes (Signed)
   Subjective: [name] [name] is feeling well today. He had some mild soreness in the right stump site when I first saw him this morning which had eased when I saw him on rounds.     Objective:  Vital signs in last 24 hours: Vitals:   10/11/17 0812 10/11/17 1741 10/11/17 2145 10/12/17 0605  BP: (!) 99/56 100/65 100/64 105/70  Pulse: 74 70 74 70  Resp: 16 20 20 20   Temp: 98.9 F (37.2 C) 98.9 F (37.2 C) 99.5 F (37.5 C) 98.3 F (36.8 C)  TempSrc: Oral Oral Oral Oral  SpO2: 98% 95% 97% 97%  Weight:      Height:       Physical Exam  Constitutional: He appears well-developed and well-nourished.  Cardiovascular: Normal rate, regular rhythm, normal heart sounds and intact distal pulses. Exam reveals no gallop and no friction rub.  No murmur heard. Pulmonary/Chest: Effort normal. No respiratory distress. He has no wheezes. He has rales (LLF and RLF).  Abdominal: Soft. Bowel sounds are normal. He exhibits no distension and no mass. There is no tenderness. There is no guarding.  Musculoskeletal:  B/l AKA with staples overlying amputation sites, some tenderness to palpation of the right stump   Neurological: He is alert.     Assessment/Plan:  Active Problems:   Cellulitis   PAD (peripheral artery disease) (HCC)   Chronic renal insufficiency, stage 3 (moderate) (HCC)   HTN (hypertension), benign   Pressure injury of skin   S/P AKA (above knee amputation) bilateral (HCC)  Bilateral AKA: Performed for infected ulcerations 2/2progressivevascular disease as pt withT2DM, hyperlipidemia.Left leg worse than right.amputations 6/13 -source now controlled, hgb stable at 11 -pt has required minimal pain meds post procedure  HFpEF: ECHO 2014 demonstrated G2DD with normal LVEF. Remains euvolemic on exam. He is on lasix 20mg  TID at home.  -Blood pressure coming up, pt beginning to retain volume will start back lasix 20mg  PO  T2DM:  A1C on admission 6.7%. Fasting glucose 114 this AM    -continue to monitor without insulin this admission   Gout:  He is previously prescribed allopurinol 300mg  daily and colchicine 0.6mg  daily with which he endorsed compliance. -Uric acid elevatedon admissionwill continue allopurinol and colchicine  CKD 3 HTN  cr baseline appears to be about 1.5, pt is on losartan 25mg  daily and verapamil at home but given his soft pressures this is being held. -creatinine stable, better than baseline today -starting back lasix 20mg  PO for volume, continue to monitor  Dispo: Anticipated discharge when a SNF bed becomes available   [name] Roan, MD 10/12/2017, 7:08 AM [name] Muff MD PGY-1 Internal Medicine Pager # [phone]

## 2017-10-12 NOTE — Clinical Social Work Note (Signed)
Daughter [name] [name] [phone]) contacted and given facility responses. Her first choice is [facility]. CSW requested that daughter review of choice and provide a 2nd choice just in case. CSW will contact [facility] on 6/18.  [name] [name], MSW, LCSW Licensed Clinical Social Worker [place] [phone]

## 2017-10-12 NOTE — Progress Notes (Signed)
Nutrition Follow-up  DOCUMENTATION CODES:   Obesity unspecified  INTERVENTION:    Juven Fruit Punch BID, each serving provides 95kcal and 2.5g of protein (amino acids glutamine and arginine)  Provide Premier Protein once/daily, each supplement provides 160kcal and 30g protein.   Add gravy to all meals  NUTRITION DIAGNOSIS:   Increased nutrient needs related to wound healing as evidenced by estimated needs.  Ongoing  GOAL:   Patient will meet greater than or equal to 90% of their needs  Not meeting  MONITOR:   PO intake, Supplement acceptance, Weight trends, Labs, Skin  REASON FOR ASSESSMENT:   Consult Wound healing  ASSESSMENT:   84 yo male with PMH of PAD, T2DM, HFpEF, gout. He says he is here to address his worsening pain from gout; seems like pain is mainly occurring in his left ankle. He has had extensive vascular workup in the past and consultation and recommendation had been made for bilateral amputation but the patient refused at that time. He is still receiving Unna boot wraps 3x per week.    Meal completions show pt eating 0%, suspect this is inaccurate. RD observed pt eating breakfast that contained eggs, grits, and oranges. Pt had complaints about the dry scrambled eggs and requested gravy on all of his future meals. RD to order. Suspect pt has issues swallowing drier foods as he explains that they "get stuck." RN mixing miralax in his Juven but pt does not consume 100% of these. Will continue to encourage intake of Juven with bowel regimen as pt is constipated.   Medications reviewed and include: 20 mg lasix once/day, miralax Labs reviewed: Cl 96 (L)   Diet Order:   Diet Order           Diet Heart Room service appropriate? Yes; Fluid consistency: Thin  Diet effective now          EDUCATION NEEDS:   No education needs have been identified at this time  Skin:  Skin Assessment: Skin Integrity Issues: Skin Integrity Issues:: Stage II, Other  (Comment) Stage II: sacrum Other: vascular/neuropathic wounds (3) to L foot and leg  Last BM:  10/03/17  Height:   Ht Readings from Last 1 Encounters:  10/08/17 5\' 10"  (1.778 m)    Weight:   Wt Readings from Last 1 Encounters:  11/19/16 226 lb 1.6 oz (102.6 kg)    Ideal Body Weight:  75.45 kg  BMI:  Body mass index is 35.34 kg/m.  Estimated Nutritional Needs:   Kcal:  9381-0175 (18-20 kcal/kg)  Protein:  100-115 grams  Fluid:  >/= 1.8 L/day    Mariana Single RD, LDN Clinical Nutrition Pager # - [phone]

## 2017-10-12 NOTE — Progress Notes (Signed)
Physical Therapy Treatment Patient Details Name: [name] [name] MRN: [id number] DOB: 12/30/1926 Today's Date: 10/12/2017    History of Present Illness 84 year old man with advanced peripheral vascular disease.  Bilateral amputation has been perfromed on 10/08/2017 .  PMH: hypertension, type 2 diabetes currently on diet alone, gout on allopurinol and colchicine, advanced to psoriasis, stage III chronic renal insufficiency.  Patient admitted due to pain in Bilateral LE's he attributes to gout.    PT Comments    Continuing work on functional mobility and activity tolerance;  Session focused on bed mobility and sitting balance; Heavy posterior lean; worked on sitting EOB without external support; mod assist to pull to unsupported sitting and hold; continue to recommend SNF for rehab  Follow Up Recommendations  SNF     Equipment Recommendations  Wheelchair (measurements PT);Wheelchair cushion (measurements PT);Other (comment)(Sliding board)    Recommendations for Other Services OT consult     Precautions / Restrictions Precautions Precautions: Fall    Mobility  Bed Mobility Overal bed mobility: Needs Assistance Bed Mobility: Rolling;Sidelying to Sit;Sit to Supine Rolling: Max assist Sidelying to sit: Max assist   Sit to supine: Total assist   General bed mobility comments: Cues for technique and use fo bed rail for rolling with good initiation of roll; Max assist to elevate trunk to sit with uplift at R shoulder girdle, and downward pressure at L pelvis to facilitate coming to sit; See balance comments for EOB activity; Total assist to return supine and position in bed for chair position to eat breakfast  Transfers Overall transfer level: Needs assistance   Transfers: Lateral/Scoot Transfers          Lateral/Scoot Transfers: Total assist General transfer comment: Attempted lateral scoot transfer towards [facility], however difficulty with anterior weight shift and use of UEs to  push  Ambulation/Gait                 Stairs             Wheelchair Mobility    Modified Rankin (Stroke Patients Only)       Balance   Sitting-balance support: Bilateral upper extremity supported Sitting balance-Leahy Scale: Poor Sitting balance - Comments: Sat EOB approx 6-8 minutes, and +2 assist for safety and to give [name] [name] more confidence; initial heavy posterior lean, requiring mod/max assist; Worked on anterior weight shifting with mod assist; performed pulling from unpported sitting to unsupported sitting with 30 sec hold in unsupported sitting x 6 reps                                    Cognition Arousal/Alertness: Awake/alert Behavior During Therapy: WFL for tasks assessed/performed Overall Cognitive Status: Within Functional Limits for tasks assessed                                        Exercises      General Comments        Pertinent Vitals/Pain Pain Assessment: Faces Faces Pain Scale: Hurts even more Pain Location: Bilateral residual limbs, and R shoulder (reports R shoulder has been hurting for a while) Pain Descriptors / Indicators: Grimacing;Guarding;Aching;Sore Pain Intervention(s): Monitored during session;Premedicated before session;Repositioned    Home Living                      Prior  Function            PT Goals (current goals can now be found in the care plan section) Acute Rehab PT Goals Patient Stated Goal: Reports he wants to move better PT Goal Formulation: With patient Time For Goal Achievement: 10/20/17 Potential to Achieve Goals: Fair Progress towards PT goals: Progressing toward goals(slowly)    Frequency    Min 3X/week      PT Plan Current plan remains appropriate    Co-evaluation              AM-PAC PT "6 Clicks" Daily Activity  Outcome Measure  Difficulty turning over in bed (including adjusting bedclothes, sheets and blankets)?: Unable Difficulty  moving from lying on back to sitting on the side of the bed? : Unable Difficulty sitting down on and standing up from a chair with arms (e.g., wheelchair, bedside commode, etc,.)?: Unable Help needed moving to and from a bed to chair (including a wheelchair)?: Total Help needed walking in hospital room?: Total Help needed climbing 3-5 steps with a railing? : Total 6 Click Score: 6    End of Session Equipment Utilized During Treatment: (bed pad) Activity Tolerance: Patient tolerated treatment well Patient left: in bed;with call bell/phone within reach;Other (comment)(bed in chair position, preparing to eat breakfast) Nurse Communication: Mobility status PT Visit Diagnosis: Other abnormalities of gait and mobility (R26.89);Pain;Muscle weakness (generalized) (M62.81) Pain - Right/Left: (bil LEs; R shoulder) Pain - part of body: Shoulder;Leg(bil LEs)     Time: 7116-5790 PT Time Calculation (min) (ACUTE ONLY): 20 min  Charges:  $Therapeutic Activity: 8-22 mins                    G Codes:       Roney Marion, PT  Acute Rehabilitation Services Pager [phone] Office [phone]    Colletta [place] 10/12/2017, 10:09 AM

## 2017-10-12 NOTE — Progress Notes (Signed)
Daily Progress Note   Patient Name: [name] [name]       Date: 10/12/2017 DOB: Jul 17, 1926  Age: 84 y.o. MRN#: [id number] Attending Physician: Axel Filler, * Primary Care Physician: Alvester Chou, NP Admit Date: 10/04/2017  Reason for Consultation/Follow-up: Establishing goals of care  Subjective: Patient is resting in bed. He states he feels better since having the amputations and he has less pain than before the procedures. He states he regrets not having it done sooner.    Length of Stay: 8  Current Medications: Scheduled Meds:  . allopurinol  300 mg Oral Daily  . colchicine  0.6 mg Oral Daily  . enoxaparin (LOVENOX) injection  40 mg Subcutaneous Q24H  . furosemide  20 mg Oral Daily  . gabapentin  100 mg Oral BID BM  . gabapentin  300 mg Oral QHS  . hydrocerin   Topical BID  . multivitamin with minerals  1 tablet Oral Daily  . nutrition supplement (JUVEN)  1 packet Oral BID BM  . polyethylene glycol  17 g Oral Daily  . protein supplement shake  11 oz Oral Q24H  . ramelteon  8 mg Oral QHS    Continuous Infusions: . lactated ringers 10 mL/hr at 10/08/17 1258    PRN Meds: HYDROmorphone (DILAUDID) injection, sodium chloride, traMADol  Physical Exam  Constitutional: No distress.  Pulmonary/Chest: Effort normal.  Neurological: He is alert.  Oriented.  Skin: Skin is warm and dry.            Vital Signs: BP 99/66 (BP Location: Right Arm)   Pulse 74   Temp 97.8 F (36.6 C) (Oral)   Resp 18   Ht 5\' 10"  (1.778 m)   Wt 111.7 kg (246 lb 4.8 oz)   SpO2 97%   BMI 35.34 kg/m  SpO2: SpO2: 97 % O2 Device: O2 Device: Room Air O2 Flow Rate: O2 Flow Rate (L/min): 2 L/min  Intake/output summary:   Intake/Output Summary (Last 24 hours) at 10/12/2017 1149 Last data filed at  10/12/2017 1119 Gross per 24 hour  Intake 420 ml  Output 650 ml  Net -230 ml   LBM: Last BM Date: 10/03/17 Baseline Weight: Weight: 112 kg (247 lb) Most recent weight: Weight: (pt in pain and bed unable to weigh)       Palliative Assessment/Data:  New amputations.    Flowsheet Rows     Most Recent Value  Intake Tab  Referral Department  Hospitalist  Unit at Time of Referral  Med/Surg Unit  Palliative Care Primary Diagnosis  Pain  Date Notified  10/05/17  Palliative Care Type  New Palliative care  Reason for referral  Clarify Goals of Care  Date of Admission  10/04/17  Date first seen by Palliative Care  10/05/17  # of days Palliative referral response time  0 Day(s)  # of days IP prior to Palliative referral  1  Clinical Assessment  Psychosocial & Spiritual Assessment  Palliative Care Outcomes      Patient Active Problem List   Diagnosis Date Noted  . S/P AKA (above knee amputation) bilateral ([place])   . Pressure injury of skin 10/04/2017  . Hyperkalemia 11/17/2016  . Venous stasis ulcer ([place]) 11/17/2016  . Venous stasis ulcer of lower extremity ([place]) 05/23/2015  . Cellulitis of both lower extremities 10/18/2014  . Chronic renal insufficiency, stage 3 (moderate) ([place]) 10/18/2014  . HTN (hypertension), benign 10/18/2014  . Chronic diastolic CHF (congestive heart failure) ([place]) 10/18/2014  . Lower extremity cellulitis 10/18/2014  . PAD (peripheral artery disease) ([place]) 02/02/2014  . Atherosclerosis of native arteries of the extremities with ulceration ([place]) 07/22/2013  . Acute renal failure ([employer]) 11/15/2012  . Fall 11/15/2012  . Diabetic ulcer of left foot ([place]) 10/15/2012  . Diabetes mellitus ([place]) 10/15/2012  . Lymphadenitis 08/07/2012  . Cellulitis 08/07/2012  . HTN (hypertension) 08/05/2012  . Renal insufficiency 08/05/2012  . Tobacco abuse 08/05/2012  . Gout 08/05/2012  . Hyperglycemia 08/05/2012    Palliative Care Assessment & Plan   Patient Profile:  Patient presented here to address his worsening pain from gout. It is difficult to discern where exactly he feels this pain seems like it may be the left ankle. He has had extensive vascular workup in the past and consultationis followed by Dr. Bridgett Larsson, bilateral amputation was recommended but the patient refused at that time.   Assessment/Recommendations/Plan:  Amputations completed. States pain is improving, and is controlled. If complaints of neuropathic pain or phantom limb pain are noted, could consider Neurontin or Lyrica.    Continuing full scope of treatment.    Code Status:    Code Status Orders  (From admission, onward)        Start     Ordered   10/04/17 1653  Full code  Continuous     10/04/17 1657    Code Status History    Date Active Date Inactive Code Status Order ID Comments User Context   11/17/2016 2121 11/20/2016 2110 Full Code [id number]  Vianne Bulls, MD ED   10/18/2014 2302 10/22/2014 1659 Full Code [id number]  Rise Patience, MD Inpatient   11/15/2012 2242 11/19/2012 1909 Full Code [id number]  Rise Patience, MD Inpatient   10/16/2012 0049 10/19/2012 0016 Full Code [id number]  Rise Patience, MD Inpatient   08/05/2012 1744 08/13/2012 1935 Full Code [id number]  Barton Dubois, MD Inpatient       Prognosis:   Unable to determine  Discharge Planning:  To Be Determined  Care plan was discussed with primary team and RN.   Thank you for allowing the Palliative Medicine Team to assist in the care of this patient.   Total Time 25 min Prolonged Time Billed no      Greater than 50%  of this time was spent counseling and coordinating care related to the above  assessment and plan.  Asencion Gowda, NP  Please contact Palliative Medicine Team phone at [phone] for questions and concerns.

## 2017-10-12 NOTE — Care Management Important Message (Signed)
Important Message  Patient Details  Name: [name] [name] MRN: [id number] Date of Birth: 12-13-1926   Medicare Important Message Given:  Yes Patient refused to sign, unsigned copy left with patient   Delorse Lek 10/12/2017, 4:47 PM

## 2017-10-12 NOTE — Progress Notes (Signed)
Internal Medicine Attending:   I saw and examined the patient. I reviewed the resident's note and I agree with the resident's findings and plan as documented in the resident's note.  Hospital day #7 now status post bilateral above-knee amputations for severe peripheral artery disease with ulcerations.  He is doing well in the postoperative period, spirits seem to be high.  I would like to see him up and out of bed more today, into the chair.  Working on placement in skilled nursing facility for ongoing care, it seems he has very little support at home. Currently he is bedbound even working with physical therapy, I am hoping that with work we can get him to tolerate mobility with a wheelchair.

## 2017-10-13 ENCOUNTER — Telehealth: Payer: Self-pay | Admitting: Vascular Surgery

## 2017-10-13 LAB — CBC
HEMATOCRIT: 35.1 % — AB (ref 39.0–52.0)
HEMOGLOBIN: 11.1 g/dL — AB (ref 13.0–17.0)
MCH: 30.1 pg (ref 26.0–34.0)
MCHC: 31.6 g/dL (ref 30.0–36.0)
MCV: 95.1 fL (ref 78.0–100.0)
Platelets: 319 10*3/uL (ref 150–400)
RBC: 3.69 MIL/uL — AB (ref 4.22–5.81)
RDW: 13.9 % (ref 11.5–15.5)
WBC: 5.4 10*3/uL (ref 4.0–10.5)

## 2017-10-13 LAB — BASIC METABOLIC PANEL
ANION GAP: 6 (ref 5–15)
BUN: 31 mg/dL — ABNORMAL HIGH (ref 6–20)
CALCIUM: 8.6 mg/dL — AB (ref 8.9–10.3)
CO2: 33 mmol/L — ABNORMAL HIGH (ref 22–32)
Chloride: 96 mmol/L — ABNORMAL LOW (ref 101–111)
Creatinine, Ser: 1.35 mg/dL — ABNORMAL HIGH (ref 0.61–1.24)
GFR calc non Af Amer: 45 mL/min — ABNORMAL LOW (ref >60)
GFR, EST AFRICAN AMERICAN: 52 mL/min — AB (ref >60)
Glucose, Bld: 109 mg/dL — ABNORMAL HIGH (ref 65–99)
POTASSIUM: 4.1 mmol/L (ref 3.5–5.1)
Sodium: 135 mmol/L (ref 135–145)

## 2017-10-13 MED ORDER — TRAMADOL HCL 50 MG PO TABS: 100.0000 mg | ORAL_TABLET | Freq: Two times a day (BID) | ORAL | Status: DC | PRN

## 2017-10-13 MED ORDER — SALINE SPRAY 0.65 % NA SOLN: 1.0000 | NASAL | 0 refills | Status: DC | PRN

## 2017-10-13 MED ORDER — ADULT MULTIVITAMIN W/MINERALS CH: 1.0000 | ORAL_TABLET | Freq: Every day | ORAL | Status: DC

## 2017-10-13 MED ORDER — PREMIER PROTEIN SHAKE: 11.0000 [oz_av] | ORAL | 0 refills | Status: DC

## 2017-10-13 MED ORDER — FUROSEMIDE 20 MG PO TABS: 20.0000 mg | ORAL_TABLET | Freq: Every day | ORAL | Status: DC

## 2017-10-13 MED ORDER — HYDROCERIN EX CREA: 1.0000 "application " | TOPICAL_CREAM | Freq: Two times a day (BID) | CUTANEOUS | 0 refills | Status: DC

## 2017-10-13 MED ORDER — RAMELTEON 8 MG PO TABS: 8.0000 mg | ORAL_TABLET | Freq: Every day | ORAL | Status: DC

## 2017-10-13 MED ORDER — POLYETHYLENE GLYCOL 3350 17 G PO PACK: 17.0000 g | PACK | Freq: Every day | ORAL | 0 refills | Status: DC

## 2017-10-13 NOTE — Progress Notes (Signed)
   VASCULAR SURGERY ASSESSMENT & PLAN:   5 Days Post-Op s/p: Bilat AKA's  Steady progress.   I will arrange f/u as an outpatient.  SUBJECTIVE:   No complaints.  PHYSICAL EXAM:   Vitals:   10/12/17 0955 10/12/17 1636 10/12/17 2039 10/13/17 0712  BP: 99/66 92/68 110/66 101/72  Pulse: 74 70 78 70  Resp: 18 18 (!) 24 16  Temp: 97.8 F (36.6 C) 98.2 F (36.8 C) 98.9 F (37.2 C) 98.3 F (36.8 C)  TempSrc: Oral Oral Oral Oral  SpO2: 97% 98% 97% 97%  Weight:      Height:       Dressings are both dry.   LABS:   Lab Results  Component Value Date   WBC 7.6 10/12/2017   HGB 11.6 (L) 10/12/2017   HCT 36.4 (L) 10/12/2017   MCV 95.3 10/12/2017   PLT 326 10/12/2017   Lab Results  Component Value Date   CREATININE 1.29 (H) 10/12/2017   Lab Results  Component Value Date   INR 1.03 10/30/2009   CBG (last 3)  No results for input(s): GLUCAP in the last 72 hours.  PROBLEM LIST:    Active Problems:   Cellulitis   PAD (peripheral artery disease) (HCC)   Chronic renal insufficiency, stage 3 (moderate) (HCC)   HTN (hypertension), benign   Pressure injury of skin   S/P AKA (above knee amputation) bilateral (HCC)   CURRENT MEDS:   . allopurinol  300 mg Oral Daily  . colchicine  0.6 mg Oral Daily  . enoxaparin (LOVENOX) injection  40 mg Subcutaneous Q24H  . furosemide  20 mg Oral Daily  . gabapentin  100 mg Oral BID BM  . gabapentin  300 mg Oral QHS  . hydrocerin   Topical BID  . multivitamin with minerals  1 tablet Oral Daily  . nutrition supplement (JUVEN)  1 packet Oral BID BM  . polyethylene glycol  17 g Oral Daily  . protein supplement shake  11 oz Oral Q24H  . ramelteon  8 mg Oral QHS    [name] Beeper: [phone] Office: [phone] 10/13/2017

## 2017-10-13 NOTE — Progress Notes (Signed)
   Subjective: Mr. Henney is feeling well today. He feels he is progressing.  Pain is well controlled. Worked with PT yesterday said it went well.  Objective:  Vital signs in last 24 hours: Vitals:   10/12/17 0955 10/12/17 1636 10/12/17 2039 10/13/17 0712  BP: 99/66 92/68 110/66 101/72  Pulse: 74 70 78 70  Resp: 18 18 (!) 24 16  Temp: 97.8 F (36.6 C) 98.2 F (36.8 C) 98.9 F (37.2 C) 98.3 F (36.8 C)  TempSrc: Oral Oral Oral Oral  SpO2: 97% 98% 97% 97%  Weight:      Height:       Physical Exam  Constitutional: He appears well-developed and well-nourished.  Cardiovascular: Normal rate, regular rhythm, normal heart sounds and intact distal pulses. Exam reveals no gallop and no friction rub.  No murmur heard. Pulmonary/Chest: Effort normal. No respiratory distress. He has no wheezes. He has rales (LLF and RLF mild improvement from day prior).  Abdominal: Soft. Bowel sounds are normal. He exhibits no distension and no mass. There is no tenderness. There is no guarding.  Musculoskeletal:  B/l AKA with stocking overlying amputation sites, clean and dry.   Neurological: He is alert.     Assessment/Plan:  Active Problems:   Cellulitis   PAD (peripheral artery disease) (HCC)   Chronic renal insufficiency, stage 3 (moderate) (HCC)   HTN (hypertension), benign   Pressure injury of skin   S/P AKA (above knee amputation) bilateral (HCC)  Bilateral AKA: Performed for infected ulcerations 2/2progressivevascular disease as pt withT2DM, hyperlipidemia.Left leg worse than right.amputations 6/13 -source now controlled, hgb stable at 11 -pt has required minimal pain meds post procedure  HFpEF: ECHO 2014 demonstrated G2DD with normal LVEF. Remains euvolemic on exam. He is on lasix 20mg  TID at home.  -Blood pressure coming up slowly, volume status stable  T2DM:  A1C on admission 6.7%.  -continue to monitor without insulin this admission   Gout:  He is previously  prescribed allopurinol 300mg  daily and colchicine 0.6mg  daily with which he endorsed compliance. -Uric acid elevatedon admissionwill continue allopurinol and colchicine  CKD 3 HTN  cr baseline appears to be about 1.5, pt is on losartan 25mg  daily and verapamil at home but given his soft pressures this is being held. -creatinine stable  -lung exam with some improvement continue lasix 20mg  daily  Dispo: Anticipated discharge when a SNF bed becomes available   Katherine Roan, MD 10/13/2017, 10:58 AM Vickki Muff MD PGY-1 Internal Medicine Pager # [phone]

## 2017-10-13 NOTE — Evaluation (Signed)
Occupational Therapy Evaluation Patient Details Name: [name] [name] MRN: [id number] DOB: 30-Aug-1926 Today's Date: 10/13/2017    History of Present Illness 84 year old man with advanced peripheral vascular disease.  Bilateral amputation has been perfromed on 10/08/2017 .  PMH: hypertension, type 2 diabetes currently on diet alone, gout on allopurinol and colchicine, advanced to psoriasis, stage III chronic renal insufficiency.  Patient admitted due to pain in Bilateral LE's he attributes to gout.   Clinical Impression   Pt requires extensive assist with ADLs/selfcare and mobility. Pt is limited by pain and requires cues for safety and sequencing. Pt with impaired strength, balance and endurance and planning to d/c to SNF for further rehab intervention after acute care stay. No further acute OT indicated at this time, defer further OT intervention to SNF    Follow Up Recommendations  SNF;Supervision/Assistance - 24 hour    Equipment Recommendations  Other (comment)(TBD at next venue of care)    Recommendations for Other Services       Precautions / Restrictions Precautions Precautions: Fall Restrictions Weight Bearing Restrictions: No      Mobility Bed Mobility Overal bed mobility: Needs Assistance Bed Mobility: Rolling;Sidelying to Sit;Sit to Supine Rolling: Max assist Sidelying to sit: Max assist Supine to sit: Total assist Sit to supine: Total assist   General bed mobility comments: max A to elevate trunk  Transfers Overall transfer level: Needs assistance   Transfers: Lateral/Scoot Transfers          Lateral/Scoot Transfers: Total assist General transfer comment: attempted lateral scoot, however pt unable (difficulty using UEs to push)    Balance Overall balance assessment: Needs assistance Sitting-balance support: Bilateral upper extremity supported Sitting balance-Leahy Scale: Poor Sitting balance - Comments: sat EOB x 6-7 minutes with mod - max A for  balance/support, posterior lean                                   ADL either performed or assessed with clinical judgement   ADL Overall ADL's : Needs assistance/impaired Eating/Feeding: Set up;Sitting;Bed level   Grooming: Wash/dry hands;Wash/dry face;Sitting;Maximal assistance Grooming Details (indicate cue type and reason): Poor balance Upper Body Bathing: Total assistance;Bed level   Lower Body Bathing: Total assistance;Bed level   Upper Body Dressing : Total assistance;Bed level   Lower Body Dressing: Bed level;Total assistance     Toilet Transfer Details (indicate cue type and reason): NT Toileting- Clothing Manipulation and Hygiene: Total assistance;Bed level       Functional mobility during ADLs: Maximal assistance;Total assistance;Cueing for safety;Cueing for sequencing General ADL Comments: pt limited by pain and UE weakness, Poor sitting balance     Vision Patient Visual Report: No change from baseline       Perception     Praxis      Pertinent Vitals/Pain Pain Assessment: Faces Faces Pain Scale: Hurts even more Pain Location: Bilateral residual limbs, and R shoulder (reports R shoulder has been hurting for a while) Pain Descriptors / Indicators: Grimacing;Guarding;Aching;Sore Pain Intervention(s): Limited activity within patient's tolerance;Monitored during session;Premedicated before session;Repositioned     Hand Dominance Right   Extremity/Trunk Assessment Upper Extremity Assessment Upper Extremity Assessment: Generalized weakness RUE Deficits / Details: can move both arms but limited strength and pain with movement of the right arm  RUE Sensation: history of peripheral neuropathy LUE Deficits / Details: AROM WFL, except wrist and hand limited due to "gout pain" and noted edema in wrist and  fingers LUE Sensation: history of peripheral neuropathy   Lower Extremity Assessment Lower Extremity Assessment: Defer to PT evaluation    Cervical / Trunk Assessment Cervical / Trunk Assessment: Kyphotic   Communication Communication Communication: HOH;Expressive difficulties   Cognition Arousal/Alertness: Awake/alert Behavior During Therapy: WFL for tasks assessed/performed Overall Cognitive Status: Within Functional Limits for tasks assessed                                     General Comments       Exercises     Shoulder Instructions      Home Living Family/patient expects to be discharged to:: Private residence Living Arrangements: Alone Available Help at Discharge: Personal care attendant Type of Home: Apartment Home Access: Level entry     Home Layout: One level         Biochemist, clinical: Standard     Home Equipment: Transport planner;Wheelchair - manual;Toilet riser   Additional Comments: has walker with 4 wheels on a silver frame      Prior Functioning/Environment Level of Independence: Needs assistance  Gait / Transfers Assistance Needed: does standing transfers to w/c or scooter ADL's / Homemaking Assistance Needed: aide helps with bath and meals   Comments: was able to transfer prior to his surgery         OT Problem List: Decreased strength;Decreased activity tolerance;Decreased knowledge of use of DME or AE;Pain;Decreased coordination;Impaired balance (sitting and/or standing);Decreased range of motion;Impaired sensation      OT Treatment/Interventions:      OT Goals(Current goals can be found in the care plan section) Acute Rehab OT Goals Patient Stated Goal: Reports he wants to move better OT Goal Formulation: With patient  OT Frequency:     Barriers to D/C: Decreased caregiver support          Co-evaluation              AM-PAC PT "6 Clicks" Daily Activity     Outcome Measure Help from another person eating meals?: A Little Help from another person taking care of personal grooming?: A Lot Help from another person toileting, which includes using  toliet, bedpan, or urinal?: Total Help from another person bathing (including washing, rinsing, drying)?: Total Help from another person to put on and taking off regular upper body clothing?: Total Help from another person to put on and taking off regular lower body clothing?: Total 6 Click Score: 9   End of Session Equipment Utilized During Treatment: Gait belt  Activity Tolerance: Patient limited by fatigue;Patient limited by pain Patient left: in bed;with call bell/phone within reach;with bed alarm set  OT Visit Diagnosis: Muscle weakness (generalized) (M62.81)                Time: 9702-6378 OT Time Calculation (min): 24 min Charges:  OT General Charges $OT Visit: 1 Visit OT Evaluation $OT Eval Moderate Complexity: 1 Mod OT Treatments $Therapeutic Activity: 8-22 mins G-Codes: OT G-codes **NOT FOR INPATIENT CLASS** Functional Assessment Tool Used: AM-PAC 6 Clicks Daily Activity     Britt Bottom 10/13/2017, 1:29 PM

## 2017-10-13 NOTE — Progress Notes (Signed)
Gave report to Nurse supervisor Rudean Haskell) at [facility] and answered all her questions.  Waiting for [facility] to pick the patient, will continue to monitor.

## 2017-10-13 NOTE — Progress Notes (Signed)
Patient discharged to [facility] by [facility]. Patient's cell phone,charger and shirt sent with patient. Yulitza Shorts, Wonda Cheng, Therapist, sports

## 2017-10-13 NOTE — Clinical Social Work Placement (Signed)
   CLINICAL SOCIAL WORK PLACEMENT  NOTE 10/13/17 - DISCHARGED TO [facility]IA AMBULANCE  Date:  10/13/2017  Patient Details  Name: [name] [name] MRN: [id number] Date of Birth: 1927/04/10  Clinical Social Work is seeking post-discharge placement for this patient at the [place] level of care (*CSW will initial, date and re-position this form in  chart as items are completed):  Yes   Patient/family provided with [facility] Work Department's list of facilities offering this level of care within the geographic area requested by the patient (or if unable, by the patient's family).  Yes   Patient/family informed of their freedom to choose among providers that offer the needed level of care, that participate in Medicare, Medicaid or managed care program needed by the patient, have an available bed and are willing to accept the patient.  Yes   Patient/family informed of [facility]'s ownership interest in [facility] and [facility], as well as of the fact that they are under no obligation to receive care at these facilities.  PASRR submitted to EDS on 10/09/17     PASRR number received on 10/09/17([phone] [id number] )     Existing PASRR number confirmed on       FL2 transmitted to all facilities in geographic area requested by pt/family on       FL2 transmitted to all facilities within larger geographic area on       Patient informed that his/her managed care company has contracts with or will negotiate with certain facilities, including the following:        Yes   Patient/family informed of bed offers received.  Patient chooses bed at [facility]     Physician recommends and patient chooses bed at      Patient to be transferred to [facility] on 10/13/17.  Patient to be transferred to facility by Ambulance     Patient family notified on 10/13/17 of transfer.  Name of family member notified:  Daughter [name] by phone     PHYSICIAN        Additional Comment:    _______________________________________________ Sable Feil, LCSW 10/13/2017, 4:40 PM

## 2017-10-13 NOTE — Progress Notes (Signed)
Internal Medicine Attending:   I saw and examined the patient. I reviewed the resident's note and I agree with the resident's findings and plan as documented in the resident's note.  Medically stable, doing well, ready for discharge as soon as SNF can be arranged. Looking at [facility] today.

## 2017-10-13 NOTE — Telephone Encounter (Signed)
sch appt phone NA 11/12/17 1pm staple removal

## 2017-11-11 ENCOUNTER — Encounter: Payer: Medicare Other | Admitting: Vascular Surgery

## 2017-11-12 ENCOUNTER — Encounter: Payer: Self-pay | Admitting: Physician Assistant

## 2017-11-12 ENCOUNTER — Ambulatory Visit (INDEPENDENT_AMBULATORY_CARE_PROVIDER_SITE_OTHER): Payer: Self-pay | Admitting: Physician Assistant

## 2017-11-12 VITALS — BP 119/73 | HR 56 | Temp 97.8°F | Resp 16

## 2017-11-12 DIAGNOSIS — Z89612 Acquired absence of left leg above knee: Secondary | ICD-10-CM

## 2017-11-12 DIAGNOSIS — Z89611 Acquired absence of right leg above knee: Secondary | ICD-10-CM

## 2017-11-12 NOTE — Progress Notes (Signed)
    Postoperative Visit    History of Present Illness   [name] [name] is a 84 y.o. male who presents for postoperative follow-up for: bilateral above-the-knee amputations by Dr. Bridgett Larsson.(Date: 10/08/17).  The patient's wounds are healing well.  The patient notes pain is well controlled.  He would like contact information to be evaluated for prosthetics.  He currently resides at [facility].   For VQI Use Only   PRE-ADM LIVING: Nursing home  AMB STATUS: Wheelchair   Physical Examination   Vitals:   11/12/17 1258  BP: 119/73  Pulse: (!) 56  Resp: 16  Temp: 97.8 F (36.6 C)  SpO2: 98%    BLE: Stump incisions are healing well with staples in place; no areas of fluctuance or soreness, all skin edges are viable   Medical Decision Making   [name] [name] is a 84 y.o. male who presents s/p bilateral above-the-knee amputations due to chronic BLE ulcerations.   The patient's stumps are healing appropriately . All staples removed today  The patient has been provided information for prosthetic fitting.  The patient can follow up with [place] as needed.  [name] Ligas PA-C [facility] Office: [phone]

## 2017-12-07 ENCOUNTER — Encounter (INDEPENDENT_AMBULATORY_CARE_PROVIDER_SITE_OTHER): Payer: Self-pay | Admitting: Orthopaedic Surgery

## 2017-12-07 ENCOUNTER — Ambulatory Visit (INDEPENDENT_AMBULATORY_CARE_PROVIDER_SITE_OTHER): Payer: Medicare Other

## 2017-12-07 ENCOUNTER — Ambulatory Visit (INDEPENDENT_AMBULATORY_CARE_PROVIDER_SITE_OTHER): Payer: Medicare Other | Admitting: Orthopaedic Surgery

## 2017-12-07 ENCOUNTER — Other Ambulatory Visit (INDEPENDENT_AMBULATORY_CARE_PROVIDER_SITE_OTHER): Payer: Self-pay | Admitting: *Deleted

## 2017-12-07 VITALS — BP 112/70 | HR 80 | Ht <= 58 in | Wt 240.0 lb

## 2017-12-07 DIAGNOSIS — M25511 Pain in right shoulder: Secondary | ICD-10-CM | POA: Diagnosis not present

## 2017-12-07 DIAGNOSIS — G8929 Other chronic pain: Secondary | ICD-10-CM

## 2017-12-07 DIAGNOSIS — M79641 Pain in right hand: Secondary | ICD-10-CM | POA: Diagnosis not present

## 2017-12-07 MED ORDER — LIDOCAINE HCL 1 % IJ SOLN
2.0000 mL | INTRAMUSCULAR | Status: AC | PRN
  Administered 2017-12-07: 2 mL

## 2017-12-07 MED ORDER — METHYLPREDNISOLONE ACETATE 40 MG/ML IJ SUSP
80.0000 mg | INTRAMUSCULAR | Status: AC | PRN
  Administered 2017-12-07: 80 mg

## 2017-12-07 MED ORDER — BUPIVACAINE HCL 0.5 % IJ SOLN
2.0000 mL | INTRAMUSCULAR | Status: AC | PRN
  Administered 2017-12-07: 2 mL via INTRA_ARTICULAR

## 2017-12-07 NOTE — Progress Notes (Signed)
Office Visit Note   Patient: [name] [name]           Date of Birth: 05-Nov-1926           MRN: [id number] Visit Date: 12/07/2017              Requested by: Alvester Chou, [facility] [address] [place], [place] [address] PCP: Alvester Chou, NP   Assessment & Plan: Visit Diagnoses:  1. Chronic right shoulder pain   2. Pain in right hand     Plan: Degenerative arthritis right glenohumeral joint.  Will inject with cortisone and set up home therapy.  Significant degenerative changes in right hand with stiffness.  Could be related to his diagnosis of psoriasis.  We will set up home therapy for range of motion plan to see back as needed. Long discussion over 45 minutes with family members regarding his diagnosis and treatment options.  I certainly do not think he is a surgical candidate at this point Follow-Up Instructions: Return if symptoms worsen or fail to improve.   Orders:  Orders Placed This Encounter  Procedures  . Large Joint Inj: L glenohumeral  . XR Shoulder Right  . XR Hand Complete Right   No orders of the defined types were placed in this encounter.     Procedures: Large Joint Inj: L glenohumeral on 12/07/2017 4:28 PM Details: 25 G 1.5 in needle, posterior approach  Arthrogram: No  Medications: 2 mL lidocaine 1 %; 2 mL bupivacaine 0.5 %; 80 mg methylPREDNISolone acetate 40 MG/ML Procedure, treatment alternatives, risks and benefits explained, specific risks discussed. Consent was given by the patient. Immediately prior to procedure a time out was called to verify the correct patient, procedure, equipment, support staff and site/side marked as required. Patient was prepped and draped in the usual sterile fashion.       Clinical Data: No additional findings.   Subjective: Chief Complaint  Patient presents with  . New Patient (Initial Visit)    R SHOULDER PAIN FOR 5 MO OFF AND ON. HAS SWELLING AND NUMBNESS IN MIDDLE AND RING FINGER. NO  INJURY. NEEDS PAIN MEDS  [name] [name] is accompanied by a family member and seen for evaluation of stiffness of his right hand and pain and stiffness of his right shoulder.  His past history is significant that he recently had bilateral above-knee amputations through the vascular service.  He believes he has had a problem with psoriasis and some "poor circulation".  He is not diabetic.  He has had a lot of difficulty moving his wheelchair because of his right shoulder.  No history of injury or trauma.  He also notes that he has had considerable pain and stiffness and limited use of his right hand over time.  HPI  Review of Systems  Constitutional: Negative for fatigue and fever.  HENT: Negative for ear pain.   Eyes: Negative for pain.  Respiratory: Positive for cough. Negative for shortness of breath.   Cardiovascular: Negative for leg swelling.  Gastrointestinal: Negative for constipation and diarrhea.  Genitourinary: Negative for difficulty urinating.  Musculoskeletal: Negative for back pain and neck pain.  Skin: Positive for rash.  Allergic/Immunologic: Negative for food allergies.  Neurological: Positive for weakness and numbness.  Hematological: Does not bruise/bleed easily.  Psychiatric/Behavioral: Positive for sleep disturbance.     Objective: Vital Signs: BP 112/70 (BP Location: Left Arm, Patient Position: Sitting, Cuff Size: Normal)   Pulse 80   Ht 4' (  1.219 m)   Wt 240 lb (108.9 kg)   BMI 73.24 kg/m   Physical Exam  Constitutional: He is oriented to person, place, and time. He appears well-developed.  HENT:  Head: Normocephalic.  Eyes: Pupils are equal, round, and reactive to light.  Pulmonary/Chest: Effort normal.  Neurological: He is alert and oriented to person, place, and time.  Skin: Skin is warm. Rash noted.    Ortho Exam awake alert and oriented x3.  Evaluated in a wheelchair.  Has had bilateral above-knee amputations healing nicely.  Limited and painful range of  motion of right shoulder with crepitation.  I could abduct about 90 degrees and flex about 90 degrees.  Had limited internal/external rotation.  Right hand is swollen and stiff across the MP PIP and DIP joints.  Could not make a fist lacking about 4 fingerbreadths from touching the tips of his fingers to the palm of his hand.  Has chronic psoriatic eruptions in both arms.  Good sensibility and capillary refill to fingers.  Specialty Comments:  No specialty comments available.  Imaging: Xr Hand Complete Right  Result Date: 12/07/2017 To the right hand were obtained in several projections.  No acute changes.  Considerable degenerative change at the IP joint of the thumb and across the PIP and DIP joints of the hand.  Some mild degenerative changes in the carpus.  No evidence of acute change.  Has history of psoriasis  Xr Shoulder Right  Result Date: 12/07/2017 Films of the right shoulder obtained in several projections.  There is obvious osteoarthritis of the humeral head with some flattening, subchondral cystic change and inferior directed humeral head spur is a small area of ectopic calcification just superior to the greater tuberosity.  Mild degenerative changes at the acromioclavicular joint    PMFS History: Patient Active Problem List   Diagnosis Date Noted  . S/P AKA (above knee amputation) bilateral ([place])   . Pressure injury of skin 10/04/2017  . Hyperkalemia 11/17/2016  . Venous stasis ulcer ([place]) 11/17/2016  . Venous stasis ulcer of lower extremity ([place]) 05/23/2015  . Cellulitis of both lower extremities 10/18/2014  . Chronic renal insufficiency, stage 3 (moderate) ([place]) 10/18/2014  . HTN (hypertension), benign 10/18/2014  . Chronic diastolic CHF (congestive heart failure) ([place]) 10/18/2014  . Lower extremity cellulitis 10/18/2014  . PAD (peripheral artery disease) ([place]) 02/02/2014  . Atherosclerosis of native arteries of the extremities with ulceration ([place]) 07/22/2013  .  Acute renal failure ([place]) 11/15/2012  . Fall 11/15/2012  . Diabetic ulcer of left foot ([place]) 10/15/2012  . Diabetes mellitus ([place]) 10/15/2012  . Lymphadenitis 08/07/2012  . Cellulitis 08/07/2012  . HTN (hypertension) 08/05/2012  . Renal insufficiency 08/05/2012  . Tobacco abuse 08/05/2012  . Gout 08/05/2012  . Hyperglycemia 08/05/2012   Past Medical History:  Diagnosis Date  . Chronic kidney disease    RENAL INSUFICCIENCY  . Diabetes mellitus without complication ([place])   . DVT (deep venous thrombosis) ([place])   . Gout   . Hypertension   . Lymphadenitis 08/10/2012  . Psoriasis     Family History  Problem Relation Age of Onset  . Hypertension Mother   . Stroke Mother   . Hypertension Father     Past Surgical History:  Procedure Laterality Date  . AMPUTATION Bilateral 10/08/2017   Procedure: Bilateral  ABOVE KNEE amputations;  Surgeon: Conrad Hick[place], MD;  Location: [facility] OR;  Service: Vascular;  Laterality: Bilateral;   Social History   Occupational History  .  Not on file  Tobacco Use  . Smoking status: Former Smoker    Years: 40.00    Types: Pipe  . Smokeless tobacco: Never Used  Substance and Sexual Activity  . Alcohol use: No  . Drug use: No  . Sexual activity: Not on file

## 2017-12-08 ENCOUNTER — Other Ambulatory Visit (INDEPENDENT_AMBULATORY_CARE_PROVIDER_SITE_OTHER): Payer: Self-pay | Admitting: Radiology

## 2017-12-08 DIAGNOSIS — M25511 Pain in right shoulder: Principal | ICD-10-CM

## 2017-12-08 DIAGNOSIS — M79641 Pain in right hand: Secondary | ICD-10-CM

## 2017-12-08 DIAGNOSIS — G8929 Other chronic pain: Secondary | ICD-10-CM

## 2017-12-22 ENCOUNTER — Encounter: Payer: Self-pay | Admitting: Family Medicine

## 2017-12-22 ENCOUNTER — Ambulatory Visit: Payer: Medicare Other | Attending: Family Medicine | Admitting: Family Medicine

## 2017-12-22 ENCOUNTER — Other Ambulatory Visit: Payer: Self-pay

## 2017-12-22 VITALS — BP 119/78 | HR 72 | Temp 98.6°F | Resp 18

## 2017-12-22 DIAGNOSIS — I130 Hypertensive heart and chronic kidney disease with heart failure and stage 1 through stage 4 chronic kidney disease, or unspecified chronic kidney disease: Secondary | ICD-10-CM | POA: Insufficient documentation

## 2017-12-22 DIAGNOSIS — L409 Psoriasis, unspecified: Secondary | ICD-10-CM

## 2017-12-22 DIAGNOSIS — Z23 Encounter for immunization: Secondary | ICD-10-CM | POA: Diagnosis not present

## 2017-12-22 DIAGNOSIS — M1990 Unspecified osteoarthritis, unspecified site: Secondary | ICD-10-CM | POA: Insufficient documentation

## 2017-12-22 DIAGNOSIS — M25511 Pain in right shoulder: Secondary | ICD-10-CM | POA: Diagnosis not present

## 2017-12-22 DIAGNOSIS — Z89611 Acquired absence of right leg above knee: Secondary | ICD-10-CM | POA: Insufficient documentation

## 2017-12-22 DIAGNOSIS — N183 Chronic kidney disease, stage 3 unspecified: Secondary | ICD-10-CM

## 2017-12-22 DIAGNOSIS — M792 Neuralgia and neuritis, unspecified: Secondary | ICD-10-CM

## 2017-12-22 DIAGNOSIS — Z79899 Other long term (current) drug therapy: Secondary | ICD-10-CM

## 2017-12-22 DIAGNOSIS — E1151 Type 2 diabetes mellitus with diabetic peripheral angiopathy without gangrene: Secondary | ICD-10-CM | POA: Diagnosis present

## 2017-12-22 DIAGNOSIS — M1A9XX0 Chronic gout, unspecified, without tophus (tophi): Secondary | ICD-10-CM | POA: Insufficient documentation

## 2017-12-22 DIAGNOSIS — Z87891 Personal history of nicotine dependence: Secondary | ICD-10-CM | POA: Insufficient documentation

## 2017-12-22 DIAGNOSIS — I739 Peripheral vascular disease, unspecified: Secondary | ICD-10-CM

## 2017-12-22 DIAGNOSIS — Z89612 Acquired absence of left leg above knee: Secondary | ICD-10-CM | POA: Insufficient documentation

## 2017-12-22 DIAGNOSIS — M25512 Pain in left shoulder: Secondary | ICD-10-CM | POA: Insufficient documentation

## 2017-12-22 DIAGNOSIS — D631 Anemia in chronic kidney disease: Secondary | ICD-10-CM | POA: Diagnosis not present

## 2017-12-22 DIAGNOSIS — N2889 Other specified disorders of kidney and ureter: Secondary | ICD-10-CM

## 2017-12-22 DIAGNOSIS — E1159 Type 2 diabetes mellitus with other circulatory complications: Secondary | ICD-10-CM | POA: Diagnosis not present

## 2017-12-22 DIAGNOSIS — E1122 Type 2 diabetes mellitus with diabetic chronic kidney disease: Secondary | ICD-10-CM | POA: Diagnosis present

## 2017-12-22 DIAGNOSIS — M1A09X0 Idiopathic chronic gout, multiple sites, without tophus (tophi): Secondary | ICD-10-CM

## 2017-12-22 DIAGNOSIS — I5032 Chronic diastolic (congestive) heart failure: Secondary | ICD-10-CM | POA: Diagnosis not present

## 2017-12-22 MED ORDER — TRAMADOL HCL 50 MG PO TABS: 100.0000 mg | ORAL_TABLET | Freq: Two times a day (BID) | ORAL | 3 refills | Status: DC | PRN

## 2017-12-22 MED ORDER — POLYETHYLENE GLYCOL 3350 17 G PO PACK: 17.0000 g | PACK | Freq: Every day | ORAL | 6 refills | Status: AC | PRN

## 2017-12-22 MED ORDER — ALLOPURINOL 300 MG PO TABS: 300.0000 mg | ORAL_TABLET | Freq: Every day | ORAL | 6 refills | Status: DC

## 2017-12-22 MED ORDER — FUROSEMIDE 20 MG PO TABS: 20.0000 mg | ORAL_TABLET | Freq: Every day | ORAL | 6 refills | Status: DC

## 2017-12-22 MED ORDER — GABAPENTIN 100 MG PO CAPS: ORAL_CAPSULE | ORAL | 6 refills | Status: DC

## 2017-12-22 MED ORDER — TRIAMCINOLONE ACETONIDE 0.1 % EX OINT: 1.0000 "application " | TOPICAL_OINTMENT | Freq: Two times a day (BID) | CUTANEOUS | 6 refills | Status: DC

## 2017-12-22 MED FILL — ALLOPURINOL 300 MG TAB: 300 | 30 days supply | Qty: 30 | Fill #0

## 2017-12-22 MED FILL — TRIAMCINOLONE 0.1% OINTMENT: 0.1 | 15 days supply | Qty: 30 | Fill #0

## 2017-12-22 MED FILL — traMADol HCL 50 MG TABS: 50 | 7 days supply | Qty: 30 | Fill #0

## 2017-12-22 MED FILL — GABAPENTIN 100 MG CAPSULE: 100 | 26 days supply | Qty: 130 | Fill #0

## 2017-12-22 MED FILL — POLYETHYLENE GLYCOL 3350 PO: 30 days supply | Qty: 510 | Fill #0

## 2017-12-22 MED FILL — FUROSEMIDE 20 MG TABLET: 20 | 30 days supply | Qty: 30 | Fill #0

## 2017-12-22 NOTE — Progress Notes (Signed)
Subjective:    Patient ID: [name] [name], male    DOB: 1926/10/12, 84 y.o.   MRN: [id number]  HPI       84 year old male new to the practice with complicated medical issues including advanced peripheral vascular disease and a history of diabetes with prior foot ulcer who is status post bilateral above-the-knee amputations in June of this year.  Prior to his surgery, patient lived alone and did not have any immediate family in the area.  Patient also with a history of diabetes, gout, psoriasis with possible psoriatic arthritis, possible history of lower extremity DVT and chronic renal insufficiency.  Patient was admitted to the hospital on 10/04/2017 with bilateral lower extremity pain, chills and was thought to have had cellulitis and was treated with IV antibiotics.  During his hospitalization, patient agreed to bilateral above-the-knee amputations which had been previously recommended due to his advanced peripheral arterial disease.  Patient was discharged from the hospital on 10/13/2017 to an outpatient rehabilitation facility.  Patient is accompanied by his daughter who now lives with the patient.  Patient is interested in obtaining a motorized wheelchair/have a mobility examination at today's visit in addition to establishing with a primary care provider.  Patient currently has a manual wheelchair but states that pain in his hands from gout as well as shoulder pain cause him to have difficulty with the use of his current wheelchair.  Patient has to greatly rely on his stepdaughter to perform all home ADLs such as cooking and cleaning and patient requires assistance with transfers with toileting and requires assistance with bathing, getting back and forth into the bathroom when his shoulder pain and pain in his hand and wrist from gout prevent him from being able to manually move his wheelchair or if pain prevents him from being able to use grab bars to complete transfers.  Patient also does not like the fact  that he feels as if he is being a burden and wishes he was more independent around his home.  Due to his bilateral above-the-knee amputations, patient cannot use a cane, walker or scooter.  Patient would be unable to transfer onto a scooter without difficulty and patient has some hand deformities as well as pain due to gout which would eliminate any mobility device that would require him to grip handles for steering.  Patient states that he is used a powered wheelchair in the past without difficulty until the wheelchair stopped working.  Patient's daughter reports that she has seen the patient use a powered wheelchair in the past without any issues.  Both patient and daughter believe that patient has the mental capacity to use a powered wheelchair.      Patient reports that he continues to have occasional sharp, shooting pains in his legs status post amputation.  Patient denies any issues with skin breakdown.  Patient's daughter confirms that there has been no issues with infection or skin breakdown during his rehabilitation stay.  Daughter also needs refills of medications that were given status post discharge from rehabilitation.  Patient did not receive refills of some medications but rather received leftover medications from rehabilitation stay.  Daughter states that she is now staying with the patient but that she previously had to travel a lot for work and was not living with the patient.  Daughter states that she is his primary caregiver at this time.  Daughter and patient both report that patient's diabetes is now diet-controlled.  Blood sugar is not checked on a  regular basis.  Patient per daughter has had issues with recurrent pain and weakness in the shoulders and has had recent shoulder injection by orthopedics for right shoulder pain within the past 2 weeks.  Patient states that he has had some increased ability to move his arm and decrease in pain since his shoulder injection but patient still has  issues with gripping and holding onto objects due to chronic changes in his hands as well as occasional pain related to gout.  Patient states that he primarily has gout flareups in his hands and wrists. Social History   Tobacco Use  . Smoking status: Former Smoker    Years: 40.00    Types: Pipe  . Smokeless tobacco: Never Used  Substance Use Topics  . Alcohol use: No  . Drug use: No   Past Medical History:  Diagnosis Date  . Chronic kidney disease    RENAL INSUFICCIENCY  . Diabetes mellitus without complication ([place])   . DVT (deep venous thrombosis) ([place])   . Gout   . Hypertension   . Lymphadenitis 08/10/2012  . Psoriasis    Past Surgical History:  Procedure Laterality Date  . AMPUTATION Bilateral 10/08/2017   Procedure: Bilateral  ABOVE KNEE amputations;  Surgeon: Conrad [place], MD;  Location: [facility] OR;  Service: Vascular;  Laterality: Bilateral;   Family History  Problem Relation Age of Onset  . Hypertension Mother   . Stroke Mother   . Hypertension Father   No Known Allergies    Review of Systems     Objective:   Physical Exam BP 119/78 (BP Location: Right Arm, Patient Position: Sitting, Cuff Size: Normal)   Pulse 72   Temp 98.6 F (37 C) (Oral)   Resp 18   SpO2 96%  Patient is status post bilateral AKA and was in a manual wheelchair at today's visit.  Patient's weight was unable to be obtained as our office did not have a wheelchair scale.  Patient was seen by orthopedics on 12/07/2017 and per records from his orthopedic office visit, patient with weight of 240 pounds, height of 4 feet.  Vital signs reviewed from today's visit and from orthopedic visit on 12/07/2017 with Dr. Joni Fears. (Per orthopedic note, patient was seen for right shoulder pain, swelling and numbness in his right middle and ring finger) General-well-nourished, well-developed elderly male who looks slightly younger than stated age.  Patient is sitting in a manual wheelchair.  Patient with evidence  of prior bilateral AKA (patient is wearing shorts and his stumps are visible.)  Patient is accompanied by his daughter at today's visit. ENT- TMs gray, nares with mild edema of the nasal turbinates, normal oropharynx. Neck-supple, no lymphadenopathy, no thyromegaly, no JVD, patient with possible soft right carotid bruit but patient would not hold his breath for very long because he continued to talk off and on throughout the entire exam Cardiovascular-regular rate and rhythm Lungs-clear to auscultation bilaterally. Abdomen-soft and nontender. Musculoskeletal- patient is status post bilateral lower extremity BKA's.  Patient does not have any evidence of redness, skin breakdown or discharge/drainage from his stumps and appears to have good healing of the surgical sites.  Patient with bilateral shoulder crepitation but does have good range of motion of the right shoulder and states that since the shoulder injection he is not having any pain with range of motion at today's visit.  Patient does however have some mild swelling in both hands across the MCP joints and on his right hand, he also  has some swelling in the DIP and PIP joints with some discomfort with palpation over the joints as well as palpation over the MCP joints/knuckles of the right hand.  Patient has difficulty completely closing his right hand to make a fist.  Patient has decreased grip strength right compared to left hand.  Patient has approximately 4 out of 5 strength in the upper extremities bilaterally. Skin- patient with chronic appearing skin changes especially on the scalp, bilateral arms.  Patient with areas of hypertrophic skin on the scalp and arms consistent with past skin irritation as well as some current chronic dryness and flakiness of skin on the arms, neck, upper trunk and bilateral arms consistent with psoriasis Neuro- cranial nerves II through XII are grossly intact.  Patient with somewhat slow responses at times but was  actually oriented to specific day, date and year as well as oriented to place, patient was oriented to current events. Psych- patient exhibited normal mood and behavior however patient was very talkative and had to be redirected onto the subject at times.  He did not exhibit any anxiety or depressed mood.    Assessment & Plan:  10.  Need for immunization against influenza Patient offered and agreed to receive influenza immunization at today's visit.  Patient/daughter were given educational material regarding influenza immunization. - Flu Vaccine QUAD 36+ mos IM  1. Chronic diastolic CHF (congestive heart failure) ([place]) Patient with chronic heart failure which appears to be stable at today's visit.  Patient did not have any rales on exam and denied any increased shortness of breath or cough.  Patient will have CMP in follow-up of long-term use of multiple medications some of which are considered to be high risk.  Patient is provided with refill of Lasix. - Comprehensive metabolic panel - furosemide (LASIX) 20 MG tablet; Take 1 tablet (20 mg total) by mouth daily.  Dispense: 30 tablet; Refill: 6  2. Chronic renal insufficiency, stage 3 (moderate) (HCC) Patient with stage III chronic renal insufficiency per past hospital records.  Patient will have CBC done in follow-up to look for anemia associated with renal disease.  Patient will also have CMP to evaluate creatinine however patient is now status post bilateral AKA's and patient's loss of muscle mass secondary to the surgery will result in abnormally low/incorrect creatinine. - CBC with Differential  3. PAD (peripheral artery disease) ([place]) Patient with history of severe peripheral arterial disease which caused chronic lower extremity pain and prior foot ulceration.  Patient is status post surgery in June this year after hospitalization secondary to pain and cellulitis.  Patient has attended rehabilitation facility and is now at home.  Patient  continues to have some neuropathic pain.  If patient does not have any significant anemia, patient should take at least an 81 mg aspirin daily to help with circulation.  4. Type 2 diabetes mellitus with other circulatory complication, without long-term current use of insulin ([place]) Patient reports that he is not currently on medication for diabetes as his blood sugars are controlled with diet changes.  On review of chart, patient has had hemoglobin A1c done approximately 2 months ago which was 6.7 showing good control with dietary changes.  Introduction of medication could result in hypoglycemia which would be detrimental to patient's health therefore will have patient continue diet control of diabetes at this time and hemoglobin A1c will likely be rechecked within the next 3 to 4 months. - Comprehensive metabolic panel  5. Chronic gout of multiple sites, unspecified  cause Patient with chronic gout of multiple sites which has led to degenerative changes and abnormalities on x-rays.  Patient is status post recent x-ray of the right hand and right shoulder per orthopedics.  Patient also with some degenerative type arthritis as well.  Patient is to continue the use of allopurinol and uric acid level will be obtained at today's visit.  Patient unfortunately is also on furosemide due to chronic heart failure and use of diuretics can be an exacerbating factor in the recurrence of gout flareups.  Patient's daughter should call/have patient seen if he develops acute gout attacks or gouty tophi which may require further treatment. - Uric Acid - allopurinol (ZYLOPRIM) 300 MG tablet; Take 1 tablet (300 mg total) by mouth daily.  Dispense: 30 tablet; Refill: 6  6. Encounter for long-term (current) use of medications Patient will have CMP and CBC in follow-up of long-term use of high-risk medication and prior anemia as well as chronic medical conditions.  Patient should also take MiraLAX as patient is taking tramadol  for neuropathic pain as well as chronic pain related to gout/degenerative arthritis and possible psoriatic arthritis.  Daughter was made aware that pain medication as well as patient's now decreased mobility may lead to increased issues with constipation. - Comprehensive metabolic panel - CBC with Differential - polyethylene glycol (MIRALAX / GLYCOLAX) packet; Take 17 g by mouth daily as needed for mild constipation.  Dispense: 1 each; Refill: 6  7. Neuropathic pain Patient given prescription refill for gabapentin secondary to neuropathic pain related to phantom type pain status post bilateral AKA.  Patient also given tramadol to help with his neuropathic pain as well as long-term pain associated with gouty arthritis, possible psoriatic arthritis and degenerative arthritis - gabapentin (NEURONTIN) 100 MG capsule; Patient takes 1 Capsule in the Morning, 1 Capsule every evening and 3 Capsules at Bedtime.  Dispense: 130 capsule; Refill: 6 - traMADol (ULTRAM) 50 MG tablet; Take 2 tablets (100 mg total) by mouth every 12 (twelve) hours as needed for moderate pain.  Dispense: 30 tablet; Refill: 3  8. Psoriasis Refill provided of Kenalog ointment for psoriasis and over-the-counter moisturizer such as Eucerin also recommended. - triamcinolone ointment (KENALOG) 0.1 %; Apply 1 application topically 2 (two) times daily. To affected skin areas as needed  Dispense: 30 g; Refill: 6  9.  Impaired mobility/encounter for face-to-face mobility examination Patient is status post bilateral AKA's in June 2019 and has had recent discharge from inpatient rehabilitation center.  Patient and daughter state that patient with issues using manual wheelchair secondary to shoulder pain, pain and swelling in bilateral hands, right greater than left and decreased ability to grasp with the right hand due to pain, swelling and arthritic changes.  Patient with history of gout as well as psoriasis with possible psoriatic arthritis and  patient has had recent x-rays and injection into the right shoulder secondary to shoulder pain and was found to have degenerative changes consistent with osteoarthritis of the right shoulder.  Patient also had abnormal x-rays of the right hand including considerable degenerative change of the IP joint of the thumb and across the PIP and DIP joints of the hand per orthopedic note which was reviewed at today's visit.  Patient has had past use per patient and daughter of a powered electric wheelchair which patient used without problems until this stopped working.  Daughter believes that patient may have purchased the wheelchair.  Patient states that he use the wheelchair as he had severe pain  in his legs which limited his ability to walk-patient with known history of severe peripheral arterial disease.  Patient is now status post bilateral AKA and has issues with chronic shoulder pain as well as arthritic pain in the hands and decreased use of the right hand secondary to arthritis changes which are preventing patient to effectively use a manual wheelchair and his attempts to use the manual wheelchair may lead to further pain and injury.  I received paperwork regarding ordering a powered mobility device for the patient and this has been completed and will be returned to advanced home care as I believe that patient needs a power mobility device for quality of life, improvement in mobility and ability to self perform care/ADLs.  Patient unfortunately will likely need long-term assistance with some activities due to his arthritis with chronic pain and general medical condition including his CHF, diabetes, hypertension, peripheral arterial disease and gout.  An After Visit Summary was printed and given to the patient. Allergies as of 12/22/2017   No Known Allergies     Medication List        Accurate as of 12/22/17 11:59 PM. Always use your most recent med list.          allopurinol 300 MG tablet Commonly  known as:  ZYLOPRIM Take 1 tablet (300 mg total) by mouth daily.   colchicine 0.6 MG tablet Take 0.6 mg by mouth daily.   docusate sodium 100 MG capsule Commonly known as:  COLACE Take 100 mg by mouth 2 (two) times daily.   feeding supplement (ENSURE ENLIVE) Liqd Take 237 mLs by mouth daily at 2 PM.   feeding supplement (PRO-STAT SUGAR FREE 64) Liqd Take 30 mLs by mouth 2 (two) times daily.   furosemide 20 MG tablet Commonly known as:  LASIX Take 1 tablet (20 mg total) by mouth daily.   gabapentin 100 MG capsule Commonly known as:  NEURONTIN Patient takes 1 Capsule in the Morning, 1 Capsule every evening and 3 Capsules at Bedtime.   hydrocerin Crea Apply 1 application topically 2 (two) times daily.   multivitamin with minerals Tabs tablet Take 1 tablet by mouth daily.   polyethylene glycol packet Commonly known as:  MIRALAX / GLYCOLAX Take 17 g by mouth daily as needed for mild constipation.   protein supplement shake Liqd Commonly known as:  PREMIER PROTEIN Take 325 mLs (11 oz total) by mouth daily.   ramelteon 8 MG tablet Commonly known as:  ROZEREM Take 1 tablet (8 mg total) by mouth at bedtime.   sodium chloride 0.65 % Soln nasal spray Commonly known as:  OCEAN Place 1-2 sprays into both nostrils as needed for congestion.   traMADol 50 MG tablet Commonly known as:  ULTRAM Take 2 tablets (100 mg total) by mouth every 12 (twelve) hours as needed for moderate pain.   triamcinolone ointment 0.1 % Commonly known as:  KENALOG Apply 1 application topically 2 (two) times daily. To affected skin areas as needed   Vitamin D (Ergocalciferol) 50000 units Caps capsule Commonly known as:  DRISDOL Take 50,000 Units by mouth every 7 (seven) days.      Return in about 3 months (around 03/24/2018).

## 2017-12-23 ENCOUNTER — Telehealth: Payer: Self-pay

## 2017-12-23 LAB — CBC WITH DIFFERENTIAL/PLATELET
Basophils Absolute: 0 x10E3/uL (ref 0.0–0.2)
Basos: 1 %
EOS (ABSOLUTE): 0.2 x10E3/uL (ref 0.0–0.4)
Eos: 4 %
Hematocrit: 43 % (ref 37.5–51.0)
Hemoglobin: 14.3 g/dL (ref 13.0–17.7)
Immature Grans (Abs): 0 x10E3/uL (ref 0.0–0.1)
Immature Granulocytes: 0 %
Lymphocytes Absolute: 1.7 x10E3/uL (ref 0.7–3.1)
Lymphs: 29 %
MCH: 31.7 pg (ref 26.6–33.0)
MCHC: 33.3 g/dL (ref 31.5–35.7)
MCV: 95 fL (ref 79–97)
Monocytes Absolute: 0.6 x10E3/uL (ref 0.1–0.9)
Monocytes: 11 %
Neutrophils Absolute: 3.4 x10E3/uL (ref 1.4–7.0)
Neutrophils: 55 %
Platelets: 273 x10E3/uL (ref 150–450)
RBC: 4.51 x10E6/uL (ref 4.14–5.80)
RDW: 16.4 % — ABNORMAL HIGH (ref 12.3–15.4)
WBC: 6 x10E3/uL (ref 3.4–10.8)

## 2017-12-23 LAB — COMPREHENSIVE METABOLIC PANEL WITH GFR
ALT: 10 IU/L (ref 0–44)
AST: 23 IU/L (ref 0–40)
Albumin/Globulin Ratio: 1 — ABNORMAL LOW (ref 1.2–2.2)
Albumin: 3.8 g/dL (ref 3.5–4.7)
Alkaline Phosphatase: 73 IU/L (ref 39–117)
BUN/Creatinine Ratio: 18 (ref 10–24)
BUN: 21 mg/dL (ref 8–27)
Bilirubin Total: 0.7 mg/dL (ref 0.0–1.2)
CO2: 19 mmol/L — ABNORMAL LOW (ref 20–29)
Calcium: 9.8 mg/dL (ref 8.6–10.2)
Chloride: 101 mmol/L (ref 96–106)
Creatinine, Ser: 1.18 mg/dL (ref 0.76–1.27)
GFR calc Af Amer: 63 mL/min/1.73
GFR calc non Af Amer: 54 mL/min/1.73 — ABNORMAL LOW
Globulin, Total: 3.7 g/dL (ref 1.5–4.5)
Glucose: 116 mg/dL — ABNORMAL HIGH (ref 65–99)
Potassium: 4.5 mmol/L (ref 3.5–5.2)
Sodium: 144 mmol/L (ref 134–144)
Total Protein: 7.5 g/dL (ref 6.0–8.5)

## 2017-12-23 LAB — URIC ACID: Uric Acid: 9.8 mg/dL — ABNORMAL HIGH (ref 3.7–8.6)

## 2017-12-23 NOTE — Telephone Encounter (Signed)
Patient was called, no answer, lvm for patient to return call. If the patient returns the call please notify  patient/daughter that patient's complete metabolic panel showed the following abnormalities, glucose of 116 which technically could be within normal since patient had likely eaten prior to his visit and the rest of the CMP was otherwise unremarkable. Patient with normal kidney function and normal liver enzymes. Patient had a normal complete blood count. Patient's uric acid level however was elevated at 9.8. The goal to help prevent gout flareups is uric acid level of 6 or less. Patient needs to take his allopurinol daily.

## 2017-12-31 ENCOUNTER — Telehealth: Payer: Self-pay

## 2017-12-31 NOTE — Telephone Encounter (Signed)
Call placed to the patient to inform him that Part B of the [employer] application needs to be signed. Spoke to his daughter, Lacretia Leigh, who completed Part A for him and informed her of above note. She said she would come by the clinic today to sign as she had signed Part A.  The document was left at the front desk for her to pick up/sign.

## 2018-01-13 ENCOUNTER — Telehealth (INDEPENDENT_AMBULATORY_CARE_PROVIDER_SITE_OTHER): Payer: Self-pay | Admitting: Orthopaedic Surgery

## 2018-01-13 NOTE — Telephone Encounter (Signed)
Sonia Side calling back to let you know he received referral, and they will schedule patient ASAP.

## 2018-01-19 MED FILL — traMADol HCL 50 MG TABS: 50 | 7 days supply | Qty: 30 | Fill #1

## 2018-01-20 ENCOUNTER — Telehealth: Payer: Self-pay

## 2018-01-20 NOTE — Telephone Encounter (Signed)
Patient's daughter, Lacretia Leigh signed Part B of [employer] application. The completed application was then faxed to [employer] eligibility.

## 2018-01-21 MED FILL — ALLOPURINOL 300 MG TAB: 300 | 30 days supply | Qty: 30 | Fill #1

## 2018-02-02 MED FILL — traMADol HCL 50 MG TABS: 50 | 7 days supply | Qty: 30 | Fill #2

## 2018-02-24 MED FILL — TRIAMCINOLONE 0.1% OINTMENT: 0.1 | 15 days supply | Qty: 30 | Fill #1

## 2018-02-24 MED FILL — ALLOPURINOL 300 MG TAB: 300 | 30 days supply | Qty: 30 | Fill #1

## 2018-02-24 MED FILL — traMADol HCL 50 MG TABS: 50 | 7 days supply | Qty: 30 | Fill #3

## 2018-02-24 MED FILL — GABAPENTIN 100 MG CAPSULE: 100 | 26 days supply | Qty: 130 | Fill #1

## 2018-02-24 MED FILL — POLYETHYLENE GLYCOL 3350 PO: 14 days supply | Qty: 238 | Fill #1

## 2018-02-24 MED FILL — FUROSEMIDE 20 MG TABLET: 20 | 30 days supply | Qty: 30 | Fill #1

## 2018-03-02 ENCOUNTER — Telehealth: Payer: Self-pay

## 2018-03-02 NOTE — Telephone Encounter (Signed)
This CM spoke to Courtney/[employer] who confirmed that the patient has been certified for [employer] services.

## 2018-03-15 ENCOUNTER — Emergency Department (HOSPITAL_COMMUNITY)
Admission: EM | Admit: 2018-03-15 | Discharge: 2018-03-15 | Disposition: A | Discharge status: Home / Self Care | Payer: Medicare Other | Attending: Emergency Medicine | Admitting: Emergency Medicine

## 2018-03-15 DIAGNOSIS — E1122 Type 2 diabetes mellitus with diabetic chronic kidney disease: Secondary | ICD-10-CM | POA: Insufficient documentation

## 2018-03-15 DIAGNOSIS — G629 Polyneuropathy, unspecified: Secondary | ICD-10-CM | POA: Diagnosis not present

## 2018-03-15 DIAGNOSIS — Z79899 Other long term (current) drug therapy: Secondary | ICD-10-CM | POA: Insufficient documentation

## 2018-03-15 DIAGNOSIS — M792 Neuralgia and neuritis, unspecified: Secondary | ICD-10-CM

## 2018-03-15 DIAGNOSIS — M79604 Pain in right leg: Secondary | ICD-10-CM | POA: Diagnosis present

## 2018-03-15 DIAGNOSIS — N183 Chronic kidney disease, stage 3 (moderate): Secondary | ICD-10-CM | POA: Diagnosis not present

## 2018-03-15 DIAGNOSIS — Z87891 Personal history of nicotine dependence: Secondary | ICD-10-CM | POA: Diagnosis not present

## 2018-03-15 DIAGNOSIS — I130 Hypertensive heart and chronic kidney disease with heart failure and stage 1 through stage 4 chronic kidney disease, or unspecified chronic kidney disease: Secondary | ICD-10-CM | POA: Insufficient documentation

## 2018-03-15 DIAGNOSIS — M79605 Pain in left leg: Secondary | ICD-10-CM | POA: Diagnosis not present

## 2018-03-15 DIAGNOSIS — I5032 Chronic diastolic (congestive) heart failure: Secondary | ICD-10-CM | POA: Diagnosis not present

## 2018-03-15 LAB — BASIC METABOLIC PANEL
ANION GAP: 7 (ref 5–15)
BUN: 24 mg/dL — ABNORMAL HIGH (ref 8–23)
CO2: 27 mmol/L (ref 22–32)
Calcium: 9.3 mg/dL (ref 8.9–10.3)
Chloride: 106 mmol/L (ref 98–111)
Creatinine, Ser: 1.18 mg/dL (ref 0.61–1.24)
GFR calc Af Amer: >60 mL/min (ref >60)
GFR, EST NON AFRICAN AMERICAN: 53 mL/min — AB (ref >60)
GLUCOSE: 101 mg/dL — AB (ref 70–99)
Potassium: 3.7 mmol/L (ref 3.5–5.1)
Sodium: 140 mmol/L (ref 135–145)

## 2018-03-15 LAB — CBC
HEMATOCRIT: 41.8 % (ref 39.0–52.0)
Hemoglobin: 13.4 g/dL (ref 13.0–17.0)
MCH: 30.7 pg (ref 26.0–34.0)
MCHC: 32.1 g/dL (ref 30.0–36.0)
MCV: 95.9 fL (ref 80.0–100.0)
NRBC: 0 % (ref 0.0–0.2)
Platelets: 254 10*3/uL (ref 150–400)
RBC: 4.36 MIL/uL (ref 4.22–5.81)
RDW: 14.8 % (ref 11.5–15.5)
WBC: 4.3 10*3/uL (ref 4.0–10.5)

## 2018-03-15 MED ORDER — GABAPENTIN 100 MG PO CAPS
100.0000 mg | ORAL_CAPSULE | Freq: Once | ORAL | Status: AC
Start: 2018-03-15 — End: 2018-03-15
  Administered 2018-03-15: 100 mg via ORAL
  Filled 2018-03-15: qty 1

## 2018-03-15 MED ORDER — GABAPENTIN 100 MG PO CAPS: ORAL_CAPSULE | ORAL | 6 refills | Status: DC

## 2018-03-15 NOTE — Discharge Instructions (Signed)
Increase your Neurontin to 2 tablets in the morning in the afternoon and 3 tablets in the evening, follow-up with your doctor to make sure this new dose is working

## 2018-03-15 NOTE — ED Provider Notes (Signed)
[facility] DEPT Provider Note   CSN: [id number] Arrival date & time: 03/15/18  1630     History   Chief Complaint Chief Complaint  Patient presents with  . Leg Pain    HPI [name] [name] is a 84 y.o. male.  HPI Pt presents with complaints of burning sensation at the end of both of his legs.  Pt had AKA 6 months ago.  About 3 monhts ago he started having the burning sensation.  It feels really hot at the distal aspect of his legs at the amputation site.  No fevers. No recent falls or injury.  He was started on gabapentin and that helps somewhat but not as well recently. Past Medical History:  Diagnosis Date  . Chronic kidney disease    RENAL INSUFICCIENCY  . Diabetes mellitus without complication ([place])   . DVT (deep venous thrombosis) ([place])   . Gout   . Hypertension   . Lymphadenitis 08/10/2012  . Psoriasis     Patient Active Problem List   Diagnosis Date Noted  . S/P AKA (above knee amputation) bilateral ([place])   . Pressure injury of skin 10/04/2017  . Hyperkalemia 11/17/2016  . Venous stasis ulcer ([place]) 11/17/2016  . Venous stasis ulcer of lower extremity ([place]) 05/23/2015  . Cellulitis of both lower extremities 10/18/2014  . Chronic renal insufficiency, stage 3 (moderate) ([place]) 10/18/2014  . HTN (hypertension), benign 10/18/2014  . Chronic diastolic CHF (congestive heart failure) ([place]) 10/18/2014  . Lower extremity cellulitis 10/18/2014  . PAD (peripheral artery disease) ([place]) 02/02/2014  . Atherosclerosis of native arteries of the extremities with ulceration ([place]) 07/22/2013  . Acute renal failure ([place]) 11/15/2012  . Fall 11/15/2012  . Diabetic ulcer of left foot ([place]) 10/15/2012  . Diabetes mellitus ([place]) 10/15/2012  . Lymphadenitis 08/07/2012  . Cellulitis 08/07/2012  . HTN (hypertension) 08/05/2012  . Renal insufficiency 08/05/2012  . Tobacco abuse 08/05/2012  . Gout 08/05/2012  . Hyperglycemia 08/05/2012    Past Surgical  History:  Procedure Laterality Date  . AMPUTATION Bilateral 10/08/2017   Procedure: Bilateral  ABOVE KNEE amputations;  Surgeon: Conrad Mediapolis, MD;  Location: [facility] OR;  Service: Vascular;  Laterality: Bilateral;        Home Medications    Prior to Admission medications   Medication Sig Start Date End Date Taking? Authorizing Provider  allopurinol (ZYLOPRIM) 300 MG tablet Take 1 tablet (300 mg total) by mouth daily. 12/22/17   Fulp, Cammie, MD  Amino Acids-Protein Hydrolys (FEEDING SUPPLEMENT, PRO-STAT SUGAR FREE 64,) LIQD Take 30 mLs by mouth 2 (two) times daily. Patient not taking: Reported on 12/22/2017 11/20/16   Barton Dubois, MD  colchicine 0.6 MG tablet Take 0.6 mg by mouth daily.    [provider]  docusate sodium (COLACE) 100 MG capsule Take 100 mg by mouth 2 (two) times daily.    [provider]  feeding supplement, ENSURE ENLIVE, (ENSURE ENLIVE) LIQD Take 237 mLs by mouth daily at 2 PM. Patient not taking: Reported on 12/22/2017 11/21/16   Barton Dubois, MD  furosemide (LASIX) 20 MG tablet Take 1 tablet (20 mg total) by mouth daily. 12/22/17   Fulp, Cammie, MD  gabapentin (NEURONTIN) 100 MG capsule Patient takes 2 Capsule in the Morning, 2 Capsule every evening and 3 Capsules at Bedtime. 03/15/18   Dorie Rank, MD  hydrocerin (EUCERIN) CREA Apply 1 application topically 2 (two) times daily. Patient not taking: Reported on 12/22/2017 10/13/17   Katherine Roan, MD  Multiple Vitamin (MULTIVITAMIN WITH MINERALS) TABS tablet Take 1 tablet by mouth daily. Patient not taking: Reported on 12/22/2017 10/13/17   Katherine Roan, MD  protein supplement shake (PREMIER PROTEIN) LIQD Take 325 mLs (11 oz total) by mouth daily. Patient not taking: Reported on 12/22/2017 10/13/17   Katherine Roan, MD  ramelteon (ROZEREM) 8 MG tablet Take 1 tablet (8 mg total) by mouth at bedtime. Patient not taking: Reported on 12/22/2017 10/13/17   Katherine Roan, MD  sodium chloride (OCEAN)  0.65 % SOLN nasal spray Place 1-2 sprays into both nostrils as needed for congestion. Patient not taking: Reported on 12/22/2017 10/13/17   Katherine Roan, MD  traMADol (ULTRAM) 50 MG tablet Take 2 tablets (100 mg total) by mouth every 12 (twelve) hours as needed for moderate pain. 12/22/17   Fulp, Cammie, MD  triamcinolone ointment (KENALOG) 0.1 % Apply 1 application topically 2 (two) times daily. To affected skin areas as needed 12/22/17   Fulp, Cammie, MD  Vitamin D, Ergocalciferol, (DRISDOL) 50000 units CAPS capsule Take 50,000 Units by mouth every 7 (seven) days.    [provider]    Family History Family History  Problem Relation Age of Onset  . Hypertension Mother   . Stroke Mother   . Hypertension Father     Social History Social History   Tobacco Use  . Smoking status: Former Smoker    Years: 40.00    Types: Pipe  . Smokeless tobacco: Never Used  Substance Use Topics  . Alcohol use: No  . Drug use: No     Allergies   Patient has no known allergies.   Review of Systems Review of Systems  All other systems reviewed and are negative.    Physical Exam Updated Vital Signs BP 131/80   Pulse 86   Temp 98.5 F (36.9 C) (Oral)   Resp 15   SpO2 99%   Physical Exam  Constitutional: No distress.  HENT:  Head: Normocephalic and atraumatic.  Right Ear: External ear normal.  Left Ear: External ear normal.  Eyes: Conjunctivae are normal. Right eye exhibits no discharge. Left eye exhibits no discharge. No scleral icterus.  Neck: Neck supple. No tracheal deviation present.  Cardiovascular: Normal rate, regular rhythm and intact distal pulses.  Pulmonary/Chest: Effort normal and breath sounds normal. No stridor. No respiratory distress. He has no wheezes. He has no rales.  Abdominal: Soft. Bowel sounds are normal. He exhibits no distension. There is no tenderness. There is no rebound and no guarding.  Musculoskeletal: He exhibits no edema or tenderness.    S/p bilateral aka, no erythema or edema  Neurological: He is alert. He has normal strength. No cranial nerve deficit (no facial droop, extraocular movements intact, no slurred speech) or sensory deficit. He exhibits normal muscle tone. He displays no seizure activity. Coordination normal.  Skin: Skin is warm and dry. No rash noted. He is not diaphoretic.  Psychiatric: He has a normal mood and affect.  Nursing note and vitals reviewed.    ED Treatments / Results  Labs (all labs ordered are listed, but only abnormal results are displayed) Labs Reviewed  BASIC METABOLIC PANEL - Abnormal; Notable for the following components:      Result Value   Glucose, Bld 101 (*)    BUN 24 (*)    GFR calc non Af Amer 53 (*)    All other components within normal limits  CBC      Procedures Procedures (including  critical care time)  Medications Ordered in ED Medications  gabapentin (NEURONTIN) capsule 100 mg (100 mg Oral Given 03/15/18 1745)     Initial Impression / Assessment and Plan / ED Course  I have reviewed the triage vital signs and the nursing notes.  Pertinent labs & imaging results that were available during my care of the patient were reviewed by me and considered in my medical decision making (see chart for details).   Patient presented to the emergency room for evaluation of burning discomfort in his lower extremities.  His exam is reassuring.  There is no evidence to suggest infection.  His laboratory tests are unremarkable.  I suspect his symptoms are related to peripheral neuropathy.  He is already taking gabapentin.  I will have him increase the dose to 2 tablets in the morning in the afternoon and continue the 3 tablets in the evening.  Findings and plan were discussed with the patient and his family  Final Clinical Impressions(s) / ED Diagnoses   Final diagnoses:  Neuropathy    ED Discharge Orders         Ordered    gabapentin (NEURONTIN) 100 MG capsule     03/15/18  1900           Dorie Rank, MD 03/15/18 1901

## 2018-03-15 NOTE — ED Triage Notes (Signed)
PT BIB [facility] from home. Pt is c/o pain in bilateral LE. Pt is bilateral amputee. Pt is on gabapentin for nerve pain related to this. It has increased in severity. Pt has no other complaints.

## 2018-03-16 ENCOUNTER — Other Ambulatory Visit: Payer: Self-pay | Admitting: Family Medicine

## 2018-03-16 DIAGNOSIS — M792 Neuralgia and neuritis, unspecified: Secondary | ICD-10-CM

## 2018-03-18 NOTE — Telephone Encounter (Signed)
Pt last seen: 12/22/17 Next appt: 03/24/18 Last RX written on: 12/22/17 Date of original fill: 12/22/17 Date of refill(s): 01/19/18, 02/02/18, 02/24/18  No other controlled substances were filled during this time, please refill if appropriate.

## 2018-03-24 ENCOUNTER — Ambulatory Visit: Payer: Medicare Other | Admitting: Family Medicine

## 2018-03-31 MED FILL — ALLOPURINOL 300 MG TAB: 300 | 30 days supply | Qty: 30 | Fill #2

## 2018-03-31 MED FILL — GABAPENTIN 100 MG CAPSULE: 100 | 26 days supply | Qty: 130 | Fill #2

## 2018-04-26 ENCOUNTER — Ambulatory Visit: Payer: Medicare Other | Admitting: Internal Medicine

## 2018-05-03 ENCOUNTER — Telehealth: Payer: Self-pay

## 2018-05-03 ENCOUNTER — Other Ambulatory Visit: Payer: Self-pay | Admitting: Family Medicine

## 2018-05-03 NOTE — Telephone Encounter (Signed)
Order for wheelchair faxed to [facility]

## 2018-05-28 ENCOUNTER — Ambulatory Visit: Payer: Medicare Other | Admitting: Internal Medicine

## 2018-06-21 MED FILL — GABAPENTIN 100 MG CAP: 100 | 26 days supply | Qty: 130 | Fill #3

## 2018-06-28 ENCOUNTER — Other Ambulatory Visit: Payer: Self-pay | Admitting: Family Medicine

## 2018-06-28 DIAGNOSIS — M792 Neuralgia and neuritis, unspecified: Secondary | ICD-10-CM

## 2018-06-28 NOTE — Telephone Encounter (Signed)
1) Medication(s) Requested (by name):gabapentin (NEURONTIN) 100 MG capsule [[id number]   2) Pharmacy of Choice: [facility]   3) Special Requests: Please call [phone] to talk about how to take the medication   Approved medications will be sent to the pharmacy, we will reach out if there is an issue.  Requests made after 3pm may not be addressed until the following business day!  If a patient is unsure of the name of the medication(s) please note and ask patient to call back when they are able to provide all info, do not send to responsible party until all information is available!

## 2018-06-30 MED ORDER — GABAPENTIN 100 MG PO CAPS: ORAL_CAPSULE | ORAL | 0 refills | Status: DC

## 2018-06-30 NOTE — Telephone Encounter (Signed)
Prescription request received for gabapentin. #30 day supply provided and note to pharmacy and on SIG for RX that patient needs an office visit prior to future refills as he has not been seen in the office since August of 2019

## 2018-06-30 NOTE — Addendum Note (Signed)
Addended by: Frederich Cha on: 06/30/2018 09:11 AM   Modules accepted: Orders

## 2018-06-30 NOTE — Telephone Encounter (Signed)
RX sent on 03/15/18 was not received by [facility], it is marked as no print, please send new rx to pharmacy if appropriate.

## 2018-07-01 ENCOUNTER — Encounter (HOSPITAL_COMMUNITY): Payer: Self-pay

## 2018-07-01 ENCOUNTER — Emergency Department (HOSPITAL_COMMUNITY)
Admission: EM | Admit: 2018-07-01 | Discharge: 2018-07-01 | Disposition: A | Discharge status: Home / Self Care | Payer: Medicare Other | Attending: Emergency Medicine | Admitting: Emergency Medicine

## 2018-07-01 ENCOUNTER — Other Ambulatory Visit: Payer: Self-pay

## 2018-07-01 DIAGNOSIS — G629 Polyneuropathy, unspecified: Secondary | ICD-10-CM | POA: Diagnosis not present

## 2018-07-01 DIAGNOSIS — Z79899 Other long term (current) drug therapy: Secondary | ICD-10-CM | POA: Insufficient documentation

## 2018-07-01 DIAGNOSIS — I5032 Chronic diastolic (congestive) heart failure: Secondary | ICD-10-CM | POA: Diagnosis not present

## 2018-07-01 DIAGNOSIS — N183 Chronic kidney disease, stage 3 (moderate): Secondary | ICD-10-CM | POA: Diagnosis not present

## 2018-07-01 DIAGNOSIS — Z87891 Personal history of nicotine dependence: Secondary | ICD-10-CM | POA: Insufficient documentation

## 2018-07-01 DIAGNOSIS — G546 Phantom limb syndrome with pain: Secondary | ICD-10-CM | POA: Diagnosis not present

## 2018-07-01 DIAGNOSIS — Z760 Encounter for issue of repeat prescription: Secondary | ICD-10-CM

## 2018-07-01 DIAGNOSIS — I130 Hypertensive heart and chronic kidney disease with heart failure and stage 1 through stage 4 chronic kidney disease, or unspecified chronic kidney disease: Secondary | ICD-10-CM | POA: Diagnosis not present

## 2018-07-01 DIAGNOSIS — E1122 Type 2 diabetes mellitus with diabetic chronic kidney disease: Secondary | ICD-10-CM | POA: Insufficient documentation

## 2018-07-01 DIAGNOSIS — M792 Neuralgia and neuritis, unspecified: Secondary | ICD-10-CM

## 2018-07-01 MED ORDER — GABAPENTIN 100 MG PO CAPS: ORAL_CAPSULE | ORAL | 0 refills | Status: DC

## 2018-07-01 MED ORDER — GABAPENTIN 300 MG PO CAPS
300.0000 mg | ORAL_CAPSULE | Freq: Once | ORAL | Status: AC
  Administered 2018-07-01: 300 mg via ORAL
  Filled 2018-07-01: qty 1

## 2018-07-01 NOTE — ED Triage Notes (Signed)
Pt endorses leg burning "for a long while" takes gabapentin but states "it isnt' working any more. VSS. Axox4. Bilateral AKA.

## 2018-07-01 NOTE — ED Provider Notes (Signed)
[facility] EMERGENCY DEPARTMENT Provider Note   CSN: [id number] Arrival date & time: 07/01/18  1137    History   Chief Complaint Chief Complaint  Patient presents with  . Peripheral Neuropathy    HPI [name] [name] is a 85 y.o. male with complex past medical history including bilateral AKA's, wheelchair-bound, peripheral neuropathy, phantom limb is here for medication refill for gabapentin.  Reports burning, tingling to legs at the surgical incisions and below.  States that he feels like his feet are on fire.  This is chronic for several months since his surgery.  Slightly worse than previously.  States he takes 100 mg gabapentin for this, he takes 4 pills in the morning and 5 pills at night.  States that he came to the ER a few months ago and they told him to increase his dose to this.  Now he is running out of his gabapentin and wants his prescription changed.  He brings a bottle with him and the prescription is written for 100 mg gabapentin, 1 pill in the morning, 1 pill in the afternoon and 3 at night.  Patient admits to taking more than this to try and control his burning pain.  He has an appointment with primary care doctor on 3/19.  He denies any fevers, redness or warmth to his skin.  No falls or trauma.     HPI  Past Medical History:  Diagnosis Date  . Chronic kidney disease    RENAL INSUFICCIENCY  . Diabetes mellitus without complication ([place])   . DVT (deep venous thrombosis) ([place])   . Gout   . Hypertension   . Lymphadenitis 08/10/2012  . Psoriasis     Patient Active Problem List   Diagnosis Date Noted  . S/P AKA (above knee amputation) bilateral ([place])   . Pressure injury of skin 10/04/2017  . Hyperkalemia 11/17/2016  . Venous stasis ulcer ([place]) 11/17/2016  . Venous stasis ulcer of lower extremity ([place]) 05/23/2015  . Cellulitis of both lower extremities 10/18/2014  . Chronic renal insufficiency, stage 3 (moderate) ([place]) 10/18/2014  . HTN  (hypertension), benign 10/18/2014  . Chronic diastolic CHF (congestive heart failure) ([place]) 10/18/2014  . Lower extremity cellulitis 10/18/2014  . PAD (peripheral artery disease) ([place]) 02/02/2014  . Atherosclerosis of native arteries of the extremities with ulceration ([place]) 07/22/2013  . Acute renal failure ([place]) 11/15/2012  . Fall 11/15/2012  . Diabetic ulcer of left foot ([place]) 10/15/2012  . Diabetes mellitus ([place]) 10/15/2012  . Lymphadenitis 08/07/2012  . Cellulitis 08/07/2012  . HTN (hypertension) 08/05/2012  . Renal insufficiency 08/05/2012  . Tobacco abuse 08/05/2012  . Gout 08/05/2012  . Hyperglycemia 08/05/2012    Past Surgical History:  Procedure Laterality Date  . AMPUTATION Bilateral 10/08/2017   Procedure: Bilateral  ABOVE KNEE amputations;  Surgeon: Conrad Deuel, MD;  Location: [facility] OR;  Service: Vascular;  Laterality: Bilateral;        Home Medications    Prior to Admission medications   Medication Sig Start Date End Date Taking? Authorizing Provider  allopurinol (ZYLOPRIM) 300 MG tablet Take 1 tablet (300 mg total) by mouth daily. 12/22/17   Fulp, Cammie, MD  Amino Acids-Protein Hydrolys (FEEDING SUPPLEMENT, PRO-STAT SUGAR FREE 64,) LIQD Take 30 mLs by mouth 2 (two) times daily. Patient not taking: Reported on 12/22/2017 11/20/16   Barton Dubois, MD  colchicine 0.6 MG tablet Take 0.6 mg by mouth daily.    [provider]  docusate sodium (COLACE) 100 MG  capsule Take 100 mg by mouth 2 (two) times daily.    [provider]  feeding supplement, ENSURE ENLIVE, (ENSURE ENLIVE) LIQD Take 237 mLs by mouth daily at 2 PM. Patient not taking: Reported on 12/22/2017 11/21/16   Barton Dubois, MD  furosemide (LASIX) 20 MG tablet Take 1 tablet (20 mg total) by mouth daily. 12/22/17   Fulp, Cammie, MD  gabapentin (NEURONTIN) 100 MG capsule Patient takes 2 Capsule in the Morning, 2 Capsule every evening and 3 Capsules at Bedtime. 07/01/18 07/15/18  Kinnie Feil,  PA-C  hydrocerin (EUCERIN) CREA Apply 1 application topically 2 (two) times daily. Patient not taking: Reported on 12/22/2017 10/13/17   Katherine Roan, MD  Multiple Vitamin (MULTIVITAMIN WITH MINERALS) TABS tablet Take 1 tablet by mouth daily. Patient not taking: Reported on 12/22/2017 10/13/17   Katherine Roan, MD  protein supplement shake (PREMIER PROTEIN) LIQD Take 325 mLs (11 oz total) by mouth daily. Patient not taking: Reported on 12/22/2017 10/13/17   Katherine Roan, MD  ramelteon (ROZEREM) 8 MG tablet Take 1 tablet (8 mg total) by mouth at bedtime. Patient not taking: Reported on 12/22/2017 10/13/17   Katherine Roan, MD  sodium chloride (OCEAN) 0.65 % SOLN nasal spray Place 1-2 sprays into both nostrils as needed for congestion. Patient not taking: Reported on 12/22/2017 10/13/17   Katherine Roan, MD  traMADol (ULTRAM) 50 MG tablet Take 2 tablets (100 mg total) by mouth every 12 (twelve) hours as needed for moderate pain. 12/22/17   Fulp, Cammie, MD  triamcinolone ointment (KENALOG) 0.1 % Apply 1 application topically 2 (two) times daily. To affected skin areas as needed 12/22/17   Fulp, Cammie, MD  Vitamin D, Ergocalciferol, (DRISDOL) 50000 units CAPS capsule Take 50,000 Units by mouth every 7 (seven) days.    [provider]    Family History Family History  Problem Relation Age of Onset  . Hypertension Mother   . Stroke Mother   . Hypertension Father     Social History Social History   Tobacco Use  . Smoking status: Former Smoker    Years: 40.00    Types: Pipe  . Smokeless tobacco: Never Used  Substance Use Topics  . Alcohol use: No  . Drug use: No     Allergies   Patient has no known allergies.   Review of Systems Review of Systems  Musculoskeletal: Positive for arthralgias and myalgias.  Neurological:       Paresthesias   All other systems reviewed and are negative.    Physical Exam Updated Vital Signs BP 135/90 (BP Location: Right  Arm)   Pulse 100   Temp 98 F (36.7 C) (Oral)   Resp 16   SpO2 100%   Physical Exam Vitals signs and nursing note reviewed.  Constitutional:      General: He is not in acute distress.    Appearance: He is well-developed.     Comments: NAD.  HENT:     Head: Normocephalic and atraumatic.     Right Ear: External ear normal.     Left Ear: External ear normal.     Nose: Nose normal.  Eyes:     General: No scleral icterus.    Conjunctiva/sclera: Conjunctivae normal.  Neck:     Musculoskeletal: Normal range of motion and neck supple.  Cardiovascular:     Rate and Rhythm: Normal rate and regular rhythm.     Heart sounds: Normal heart sounds. No murmur.  Pulmonary:     Effort: Pulmonary effort is normal.     Breath sounds: Normal breath sounds. No wheezing.  Musculoskeletal: Normal range of motion.        General: No deformity.     Comments: Status post AKA's bilaterally.  Old surgical scars well-healed without erythema, warmth, fluctuance, dehiscence.  No reproducible tenderness to the bilateral stumps.  Patient has full, painless active range of motion of hips and lower extremities.  Skin:    General: Skin is warm and dry.     Capillary Refill: Capillary refill takes less than 2 seconds.  Neurological:     Mental Status: He is alert and oriented to person, place, and time.  Psychiatric:        Behavior: Behavior normal.        Thought Content: Thought content normal.        Judgment: Judgment normal.      ED Treatments / Results  Labs (all labs ordered are listed, but only abnormal results are displayed) Labs Reviewed - No data to display  EKG None  Radiology No results found.  Procedures Procedures (including critical care time)  Medications Ordered in ED Medications  gabapentin (NEURONTIN) capsule 300 mg (has no administration in time range)     Initial Impression / Assessment and Plan / ED Course  I have reviewed the triage vital signs and the nursing  notes.  Pertinent labs & imaging results that were available during my care of the patient were reviewed by me and considered in my medical decision making (see chart for details).       Patient is here for medication refill.  Pharmacy assisted in confirming last prescription for gabapentin.  Dr. Alvester Chou was last prescriber and prescription is for 100 mg a.m., 100 mg p.m. and 300 mg nightly.  I discussed this with patient.  Admits to taking more for better control of his symptoms.  Chart shows he was seen in the ER November 2019 and at that time provider increased his dose to help with symptom control.  I suspect subsequent refill did not match that prescription from the ER.  He denies any side effects of increasing gabapentin dose.  I will discharge him with new prescription for 200 mg a.m., 200 mg p.m. and 30 mg nightly until his appointment with PCP on 3/19.  Had lengthy discussion with patient regarding adverse effects of gabapentin.  He was able to do teach back method and is aware of his medication changes.  He knows not to miss his follow-up appointment with primary care doctor for better management of his chronic symptoms.  Return precautions were given.  Final Clinical Impressions(s) / ED Diagnoses   Final diagnoses:  Neuropathy  Phantom limb syndrome with pain ([place])  Medication refill    ED Discharge Orders         Ordered    gabapentin (NEURONTIN) 100 MG capsule  Status:  Discontinued    Note to Pharmacy:  Needs office visit prior to future refills   07/01/18 1256    gabapentin (NEURONTIN) 100 MG capsule    Note to Pharmacy:  Needs office visit prior to future refills   07/01/18 1303           Arlean Hopping 07/01/18 1309    Hayden Rasmussen, MD 07/01/18 1739

## 2018-07-01 NOTE — Discharge Instructions (Addendum)
You were seen in the ER for gabapentin dosing and refill.  I see you came to the ER November 2019 and at that time your gabapentin dosing was changed to 2 pills in the morning, 2 pills in the afternoon and 3 pills at bedtime.  I have sent a new prescription for this dosing to your pharmacy for the next 5 days.  Any future refills need to be done by your primary care doctor.  Do not take more pills than you are supposed to.  Your appointment with DR. FULP at [facility] is on 3/19.  Call them to confirm this and do not miss your appointment.

## 2018-07-15 ENCOUNTER — Emergency Department (HOSPITAL_COMMUNITY): Payer: Medicare Other

## 2018-07-15 ENCOUNTER — Ambulatory Visit (HOSPITAL_BASED_OUTPATIENT_CLINIC_OR_DEPARTMENT_OTHER): Payer: Medicare Other | Admitting: Family Medicine

## 2018-07-15 ENCOUNTER — Encounter (HOSPITAL_COMMUNITY): Payer: Self-pay | Admitting: Emergency Medicine

## 2018-07-15 ENCOUNTER — Inpatient Hospital Stay (HOSPITAL_COMMUNITY)
Admission: EM | Admit: 2018-07-15 | Discharge: 2018-07-17 | DRG: 308 | Disposition: A | Discharge status: Home / Self Care | Payer: Medicare Other | Attending: Family Medicine | Admitting: Family Medicine

## 2018-07-15 ENCOUNTER — Encounter: Payer: Self-pay | Admitting: Family Medicine

## 2018-07-15 ENCOUNTER — Other Ambulatory Visit: Payer: Self-pay

## 2018-07-15 VITALS — BP 147/99 | HR 133 | Temp 98.2°F

## 2018-07-15 DIAGNOSIS — N179 Acute kidney failure, unspecified: Secondary | ICD-10-CM | POA: Diagnosis present

## 2018-07-15 DIAGNOSIS — Z8249 Family history of ischemic heart disease and other diseases of the circulatory system: Secondary | ICD-10-CM | POA: Insufficient documentation

## 2018-07-15 DIAGNOSIS — Z89612 Acquired absence of left leg above knee: Secondary | ICD-10-CM

## 2018-07-15 DIAGNOSIS — E1142 Type 2 diabetes mellitus with diabetic polyneuropathy: Secondary | ICD-10-CM

## 2018-07-15 DIAGNOSIS — I361 Nonrheumatic tricuspid (valve) insufficiency: Secondary | ICD-10-CM | POA: Diagnosis not present

## 2018-07-15 DIAGNOSIS — E785 Hyperlipidemia, unspecified: Secondary | ICD-10-CM | POA: Diagnosis present

## 2018-07-15 DIAGNOSIS — I4891 Unspecified atrial fibrillation: Secondary | ICD-10-CM | POA: Diagnosis present

## 2018-07-15 DIAGNOSIS — L409 Psoriasis, unspecified: Secondary | ICD-10-CM | POA: Insufficient documentation

## 2018-07-15 DIAGNOSIS — R000 Tachycardia, unspecified: Secondary | ICD-10-CM

## 2018-07-15 DIAGNOSIS — I509 Heart failure, unspecified: Secondary | ICD-10-CM

## 2018-07-15 DIAGNOSIS — I5021 Acute systolic (congestive) heart failure: Secondary | ICD-10-CM | POA: Diagnosis not present

## 2018-07-15 DIAGNOSIS — I483 Typical atrial flutter: Secondary | ICD-10-CM | POA: Diagnosis present

## 2018-07-15 DIAGNOSIS — I10 Essential (primary) hypertension: Secondary | ICD-10-CM | POA: Diagnosis not present

## 2018-07-15 DIAGNOSIS — Z86718 Personal history of other venous thrombosis and embolism: Secondary | ICD-10-CM

## 2018-07-15 DIAGNOSIS — Z79899 Other long term (current) drug therapy: Secondary | ICD-10-CM

## 2018-07-15 DIAGNOSIS — E1151 Type 2 diabetes mellitus with diabetic peripheral angiopathy without gangrene: Secondary | ICD-10-CM

## 2018-07-15 DIAGNOSIS — J90 Pleural effusion, not elsewhere classified: Secondary | ICD-10-CM | POA: Diagnosis present

## 2018-07-15 DIAGNOSIS — M1A09X0 Idiopathic chronic gout, multiple sites, without tophus (tophi): Secondary | ICD-10-CM | POA: Diagnosis not present

## 2018-07-15 DIAGNOSIS — E1122 Type 2 diabetes mellitus with diabetic chronic kidney disease: Secondary | ICD-10-CM

## 2018-07-15 DIAGNOSIS — I248 Other forms of acute ischemic heart disease: Secondary | ICD-10-CM | POA: Diagnosis present

## 2018-07-15 DIAGNOSIS — Z89611 Acquired absence of right leg above knee: Secondary | ICD-10-CM

## 2018-07-15 DIAGNOSIS — N2889 Other specified disorders of kidney and ureter: Secondary | ICD-10-CM

## 2018-07-15 DIAGNOSIS — R05 Cough: Secondary | ICD-10-CM

## 2018-07-15 DIAGNOSIS — R42 Dizziness and giddiness: Secondary | ICD-10-CM

## 2018-07-15 DIAGNOSIS — N183 Chronic kidney disease, stage 3 unspecified: Secondary | ICD-10-CM | POA: Diagnosis present

## 2018-07-15 DIAGNOSIS — I130 Hypertensive heart and chronic kidney disease with heart failure and stage 1 through stage 4 chronic kidney disease, or unspecified chronic kidney disease: Secondary | ICD-10-CM

## 2018-07-15 DIAGNOSIS — R350 Frequency of micturition: Secondary | ICD-10-CM | POA: Diagnosis present

## 2018-07-15 DIAGNOSIS — I480 Paroxysmal atrial fibrillation: Secondary | ICD-10-CM | POA: Diagnosis present

## 2018-07-15 DIAGNOSIS — R0602 Shortness of breath: Secondary | ICD-10-CM | POA: Insufficient documentation

## 2018-07-15 DIAGNOSIS — I5043 Acute on chronic combined systolic (congestive) and diastolic (congestive) heart failure: Secondary | ICD-10-CM | POA: Diagnosis present

## 2018-07-15 DIAGNOSIS — F0390 Unspecified dementia without behavioral disturbance: Secondary | ICD-10-CM | POA: Diagnosis present

## 2018-07-15 DIAGNOSIS — I739 Peripheral vascular disease, unspecified: Secondary | ICD-10-CM | POA: Diagnosis not present

## 2018-07-15 DIAGNOSIS — M792 Neuralgia and neuritis, unspecified: Secondary | ICD-10-CM

## 2018-07-15 DIAGNOSIS — E11621 Type 2 diabetes mellitus with foot ulcer: Secondary | ICD-10-CM

## 2018-07-15 DIAGNOSIS — I5032 Chronic diastolic (congestive) heart failure: Secondary | ICD-10-CM

## 2018-07-15 DIAGNOSIS — I340 Nonrheumatic mitral (valve) insufficiency: Secondary | ICD-10-CM | POA: Diagnosis not present

## 2018-07-15 DIAGNOSIS — I313 Pericardial effusion (noninflammatory): Secondary | ICD-10-CM | POA: Diagnosis present

## 2018-07-15 DIAGNOSIS — I4892 Unspecified atrial flutter: Secondary | ICD-10-CM

## 2018-07-15 DIAGNOSIS — E1159 Type 2 diabetes mellitus with other circulatory complications: Secondary | ICD-10-CM

## 2018-07-15 DIAGNOSIS — M1A9XX0 Chronic gout, unspecified, without tophus (tophi): Secondary | ICD-10-CM | POA: Diagnosis present

## 2018-07-15 DIAGNOSIS — M109 Gout, unspecified: Secondary | ICD-10-CM | POA: Diagnosis present

## 2018-07-15 DIAGNOSIS — Z87891 Personal history of nicotine dependence: Secondary | ICD-10-CM

## 2018-07-15 DIAGNOSIS — E119 Type 2 diabetes mellitus without complications: Secondary | ICD-10-CM

## 2018-07-15 LAB — CBC
HEMATOCRIT: 42.9 % (ref 39.0–52.0)
HEMOGLOBIN: 13.5 g/dL (ref 13.0–17.0)
MCH: 30.1 pg (ref 26.0–34.0)
MCHC: 31.5 g/dL (ref 30.0–36.0)
MCV: 95.5 fL (ref 80.0–100.0)
Platelets: 255 10*3/uL (ref 150–400)
RBC: 4.49 MIL/uL (ref 4.22–5.81)
RDW: 15.4 % (ref 11.5–15.5)
WBC: 4.3 10*3/uL (ref 4.0–10.5)
nRBC: 0.5 % — ABNORMAL HIGH (ref 0.0–0.2)

## 2018-07-15 LAB — COMPREHENSIVE METABOLIC PANEL
ALT: 9 U/L (ref 0–44)
AST: 24 U/L (ref 15–41)
Albumin: 2.8 g/dL — ABNORMAL LOW (ref 3.5–5.0)
Alkaline Phosphatase: 48 U/L (ref 38–126)
Anion gap: 9 (ref 5–15)
BUN: 15 mg/dL (ref 8–23)
CHLORIDE: 103 mmol/L (ref 98–111)
CO2: 27 mmol/L (ref 22–32)
Calcium: 8.6 mg/dL — ABNORMAL LOW (ref 8.9–10.3)
Creatinine, Ser: 1.62 mg/dL — ABNORMAL HIGH (ref 0.61–1.24)
GFR calc Af Amer: 43 mL/min — ABNORMAL LOW (ref >60)
GFR calc non Af Amer: 37 mL/min — ABNORMAL LOW (ref >60)
Glucose, Bld: 173 mg/dL — ABNORMAL HIGH (ref 70–99)
Potassium: 3.8 mmol/L (ref 3.5–5.1)
Sodium: 139 mmol/L (ref 135–145)
Total Bilirubin: 1.5 mg/dL — ABNORMAL HIGH (ref 0.3–1.2)
Total Protein: 7 g/dL (ref 6.5–8.1)

## 2018-07-15 LAB — TROPONIN I
TROPONIN I: 0.09 ng/mL — AB (ref <0.03)
Troponin I: 0.1 ng/mL (ref <0.03)

## 2018-07-15 LAB — GLUCOSE, POCT (MANUAL RESULT ENTRY): POC Glucose: 96 mg/dL (ref 70–99)

## 2018-07-15 LAB — TSH: TSH: 3.091 u[IU]/mL (ref 0.350–4.500)

## 2018-07-15 LAB — MAGNESIUM: MAGNESIUM: 2.1 mg/dL (ref 1.7–2.4)

## 2018-07-15 LAB — BRAIN NATRIURETIC PEPTIDE: B NATRIURETIC PEPTIDE 5: 1151.4 pg/mL — AB (ref 0.0–100.0)

## 2018-07-15 MED ORDER — HYDROCERIN EX CREA
1.0000 "application " | TOPICAL_CREAM | Freq: Two times a day (BID) | CUTANEOUS | Status: DC
  Administered 2018-07-15 – 2018-07-17 (×4): 1 via TOPICAL
  Filled 2018-07-15: qty 113

## 2018-07-15 MED ORDER — GABAPENTIN 100 MG PO CAPS: 200.0000 mg | ORAL_CAPSULE | ORAL | Status: DC

## 2018-07-15 MED ORDER — HEPARIN BOLUS VIA INFUSION
4000.0000 [IU] | Freq: Once | INTRAVENOUS | Status: AC
  Administered 2018-07-15: 4000 [IU] via INTRAVENOUS
  Filled 2018-07-15: qty 4000

## 2018-07-15 MED ORDER — PRO-STAT SUGAR FREE PO LIQD
30.0000 mL | Freq: Two times a day (BID) | ORAL | Status: DC
  Administered 2018-07-15 – 2018-07-17 (×4): 30 mL via ORAL
  Filled 2018-07-15 (×4): qty 30

## 2018-07-15 MED ORDER — GABAPENTIN 100 MG PO CAPS
200.0000 mg | ORAL_CAPSULE | Freq: Two times a day (BID) | ORAL | Status: DC
  Administered 2018-07-16 – 2018-07-17 (×3): 200 mg via ORAL
  Filled 2018-07-15 (×3): qty 2

## 2018-07-15 MED ORDER — GABAPENTIN 300 MG PO CAPS
300.0000 mg | ORAL_CAPSULE | Freq: Every day | ORAL | Status: DC
  Administered 2018-07-15 – 2018-07-16 (×2): 300 mg via ORAL
  Filled 2018-07-15 (×2): qty 1

## 2018-07-15 MED ORDER — HEPARIN (PORCINE) 25000 UT/250ML-% IV SOLN
1350.0000 [IU]/h | INTRAVENOUS | Status: DC
  Administered 2018-07-15: 1300 [IU]/h via INTRAVENOUS
  Administered 2018-07-17: 1350 [IU]/h via INTRAVENOUS
  Filled 2018-07-15 (×3): qty 250

## 2018-07-15 MED ORDER — ALBUTEROL SULFATE (2.5 MG/3ML) 0.083% IN NEBU: 2.5000 mg | INHALATION_SOLUTION | RESPIRATORY_TRACT | Status: DC | PRN

## 2018-07-15 MED ORDER — DILTIAZEM HCL-DEXTROSE 100-5 MG/100ML-% IV SOLN (PREMIX)
5.0000 mg/h | INTRAVENOUS | Status: DC
  Administered 2018-07-15 – 2018-07-16 (×2): 5 mg/h via INTRAVENOUS
  Filled 2018-07-15 (×2): qty 100

## 2018-07-15 MED ORDER — TRIAMCINOLONE ACETONIDE 0.1 % EX OINT
1.0000 "application " | TOPICAL_OINTMENT | Freq: Two times a day (BID) | CUTANEOUS | Status: DC | PRN
  Filled 2018-07-15: qty 15

## 2018-07-15 MED ORDER — ENSURE ENLIVE PO LIQD: 237.0000 mL | Freq: Every day | ORAL | Status: DC

## 2018-07-15 MED ORDER — ACETAMINOPHEN 325 MG PO TABS
650.0000 mg | ORAL_TABLET | Freq: Four times a day (QID) | ORAL | Status: DC | PRN
  Administered 2018-07-15: 650 mg via ORAL
  Filled 2018-07-15 (×2): qty 2

## 2018-07-15 MED ORDER — FUROSEMIDE 10 MG/ML IJ SOLN
40.0000 mg | Freq: Once | INTRAMUSCULAR | Status: AC
  Administered 2018-07-15: 40 mg via INTRAVENOUS
  Filled 2018-07-15: qty 4

## 2018-07-15 MED ORDER — ACETAMINOPHEN 650 MG RE SUPP: 650.0000 mg | Freq: Four times a day (QID) | RECTAL | Status: DC | PRN

## 2018-07-15 MED ORDER — ALLOPURINOL 300 MG PO TABS
300.0000 mg | ORAL_TABLET | Freq: Every day | ORAL | Status: DC
  Administered 2018-07-15 – 2018-07-17 (×3): 300 mg via ORAL
  Filled 2018-07-15 (×3): qty 1

## 2018-07-15 MED ORDER — DIGOXIN 0.25 MG/ML IJ SOLN
0.2500 mg | Freq: Once | INTRAMUSCULAR | Status: AC
  Administered 2018-07-16: 0.25 mg via INTRAVENOUS
  Filled 2018-07-15: qty 2

## 2018-07-15 MED ORDER — HEPARIN BOLUS VIA INFUSION
4500.0000 [IU] | Freq: Once | INTRAVENOUS | Status: DC
  Filled 2018-07-15: qty 4500

## 2018-07-15 MED ORDER — DILTIAZEM LOAD VIA INFUSION
15.0000 mg | Freq: Once | INTRAVENOUS | Status: AC
  Administered 2018-07-15: 15 mg via INTRAVENOUS
  Filled 2018-07-15: qty 15

## 2018-07-15 NOTE — ED Triage Notes (Signed)
[facility] Pt was sent here due to tachycardia. Reported HR between 130-140s.   BP 118/88 96% RA CBG 96

## 2018-07-15 NOTE — Progress Notes (Signed)
ANTICOAGULATION CONSULT NOTE - Initial Consult  Pharmacy Consult for heparin Indication: atrial fibrillation  No Known Allergies  Patient Measurements:   Heparin Dosing Weight: 95.8 kg (based on height prior to AKA in 09/2017)  Vital Signs: Temp: 98.2 F (36.8 C) (03/19 1349) Temp Source: Oral (03/19 1349) BP: 133/118 (03/19 1545) Pulse Rate: 121 (03/19 1612)  Labs: Recent Labs    07/15/18 1533  HGB 13.5  HCT 42.9  PLT 255  CREATININE 1.62*    CrCl cannot be calculated (Unknown ideal weight.).   Medical History: Past Medical History:  Diagnosis Date  . Chronic kidney disease    RENAL INSUFICCIENCY  . Diabetes mellitus without complication ([place])   . DVT (deep venous thrombosis) ([place])   . Gout   . Hypertension   . Lymphadenitis 08/10/2012  . Psoriasis     Assessment: 53 yoM admitted 3/19 with tachycardia. Patient with new onset afib, pharmacy has been consulted for heparin dosing. Patient not on anticoagulation PTA. Of note, patient has bilateral AKA (09/2017). Patient's baseline CBC stable. Scr slightly elevated at 1.62.  Goal of Therapy:  Heparin level 0.3-0.7 units/ml Monitor platelets by anticoagulation protocol: Yes   Plan:  Give 4000 units bolus x 1 Start heparin infusion at 1300 units/hr Check anti-Xa level in 8 hours and daily while on heparin Continue to monitor H&H and platelets  Thank you for allowing pharmacy to be a part of this patient's care.  Leron Croak, PharmD PGY1 Pharmacy Resident Phone: [phone]  Please check AMION for all [facility] phone numbers 07/15/2018,4:39 PM

## 2018-07-15 NOTE — H&P (Addendum)
TRH H&P   Patient Demographics:    [name] [name], is a 85 y.o. male  MRN: [id number]   DOB - 22-May-1926  Admit Date - 07/15/2018  Outpatient Primary MD for the patient is [name], MD  Referring MD/NP/PA: Dr Damita Dunnings  Patient coming from: [facility] to ED  No chief complaint on file.     HPI:    [name] [name]  is a 85 y.o. male, with past medical history significant for chronic diastolic CHF, CKD stage III, hypertension, peripheral vascular disease status post bilateral AKA, diabetes mellitus, sent from [facility] clinic for A. fib with RVR, patient was at clinic for routine visit, to establish care, was noted to have tachycardia, sent for further evaluation, patient by himself denies any chest pain, shortness of breath, fever, chills, palpitation, he does report some cough, dry. - in ED she was noted to be in A. fib with RVR, started on Cardizem drip, had elevated troponin at 0.09, he denies any chest pain, blood work significant for elevated creatinine at 1.62, from baseline 1.1, chest x-ray significant for volume overload, had elevated proBNP, patient was started on heparin GTT, Cardizem GTT, I was called to admit for further evaluation.    Review of systems:    In addition to the HPI above,  No Fever-chills, No Headache, No changes with Vision or hearing, No problems swallowing food or Liquids, No Chest pain, Cough or Shortness of Breath, No Abdominal pain, No Nausea or Vommitting, Bowel movements are regular, No Blood in stool or Urine, No dysuria, No new skin rashes or bruises, No new joints pains-aches,  No new weakness, tingling, numbness in any extremity, No recent weight gain or loss, No polyuria, polydypsia or polyphagia, No significant Mental Stressors.    With Past History of the following :    Past Medical History:  Diagnosis Date   Chronic  kidney disease    RENAL INSUFICCIENCY   Diabetes mellitus without complication ([place])    DVT (deep venous thrombosis) ([place])    Gout    Hypertension    Lymphadenitis 08/10/2012   Psoriasis       Past Surgical History:  Procedure Laterality Date   AMPUTATION Bilateral 10/08/2017   Procedure: Bilateral  ABOVE KNEE amputations;  Surgeon: Conrad Deweese, MD;  Location: [facility] OR;  Service: Vascular;  Laterality: Bilateral;      Social History:     Social History   Tobacco Use   Smoking status: Former Smoker    Years: 40.00    Types: Pipe   Smokeless tobacco: Never Used  Substance Use Topics   Alcohol use: No     Lives -with daughter  Mobility -wheel chair    Family History :     Family History  Problem Relation Age of Onset   Hypertension Mother    Stroke Mother    Hypertension  Father       Home Medications:   Prior to Admission medications   Medication Sig Start Date End Date Taking? Authorizing Provider  gabapentin (NEURONTIN) 100 MG capsule Patient takes 2 Capsule in the Morning, 2 Capsule every evening and 3 Capsules at Bedtime. Patient taking differently: Take 200-300 mg by mouth See admin instructions. Takes 2 Capsules in the Morning, 2 Capsules every evening and 3 Capsules at Bedtime. 07/01/18 07/15/18 Yes Kinnie Feil, PA-C  allopurinol (ZYLOPRIM) 300 MG tablet Take 1 tablet (300 mg total) by mouth daily. Patient not taking: Reported on 07/15/2018 12/22/17   Fulp, Ander Gaster, MD  Amino Acids-Protein Hydrolys (FEEDING SUPPLEMENT, PRO-STAT SUGAR FREE 64,) LIQD Take 30 mLs by mouth 2 (two) times daily. Patient not taking: Reported on 12/22/2017 11/20/16   Barton Dubois, MD  feeding supplement, ENSURE ENLIVE, (ENSURE ENLIVE) LIQD Take 237 mLs by mouth daily at 2 PM. Patient not taking: Reported on 12/22/2017 11/21/16   Barton Dubois, MD  furosemide (LASIX) 20 MG tablet Take 1 tablet (20 mg total) by mouth daily. Patient not taking: Reported on 07/15/2018  12/22/17   Fulp, Ander Gaster, MD  hydrocerin (EUCERIN) CREA Apply 1 application topically 2 (two) times daily. Patient not taking: Reported on 07/15/2018 10/13/17   Katherine Roan, MD  Multiple Vitamin (MULTIVITAMIN WITH MINERALS) TABS tablet Take 1 tablet by mouth daily. Patient not taking: Reported on 07/15/2018 10/13/17   Katherine Roan, MD  protein supplement shake (PREMIER PROTEIN) LIQD Take 325 mLs (11 oz total) by mouth daily. Patient not taking: Reported on 07/15/2018 10/13/17   Katherine Roan, MD  ramelteon (ROZEREM) 8 MG tablet Take 1 tablet (8 mg total) by mouth at bedtime. Patient not taking: Reported on 07/15/2018 10/13/17   Katherine Roan, MD  sodium chloride (OCEAN) 0.65 % SOLN nasal spray Place 1-2 sprays into both nostrils as needed for congestion. Patient not taking: Reported on 07/15/2018 10/13/17   Katherine Roan, MD  traMADol (ULTRAM) 50 MG tablet Take 2 tablets (100 mg total) by mouth every 12 (twelve) hours as needed for moderate pain. Patient not taking: Reported on 07/15/2018 12/22/17   Fulp, Ander Gaster, MD  triamcinolone ointment (KENALOG) 0.1 % Apply 1 application topically 2 (two) times daily. To affected skin areas as needed Patient not taking: Reported on 07/15/2018 12/22/17   [name], MD     Allergies:    No Known Allergies   Physical Exam:   Vitals  Blood pressure 119/81, pulse (!) 51, resp. rate 15, height 4' (1.219 m), weight 106.6 kg, SpO2 94 %.   1. General male, sitting in bed in no apparent distress   2. Normal affect and insight, Not Suicidal or Homicidal, Awake Alert, Oriented X 3.  3. No F.N deficits, ALL C.Nerves Intact, Strength 5/5 all 4 extremities, Sensation intact all 4 extremities,  4. Ears and Eyes appear Normal, Conjunctivae clear, PERRLA. Moist Oral Mucosa.  5. Supple Neck, No JVD, No cervical lymphadenopathy appriciated, No Carotid Bruits.  6. Symmetrical Chest wall movement, Good air movement bilaterally, bibasilar  Rales  7.  Irregular irregular, tachycardic, No Gallops, Rubs or Murmurs, No Parasternal Heave.  8. Positive Bowel Sounds, Abdomen Soft, No tenderness, No organomegaly appriciated,No rebound -guarding or rigidity.  9.  No Cyanosis, Normal Skin Turgor, multiple skin lesions related to psoriasis  10. Good muscle tone,  joints appear normal , no effusions, Normal ROM.  Bilateral AKA  11. No Palpable Lymph Nodes in Neck or Axillae  Data Review:    CBC Recent Labs  Lab 07/15/18 1533  WBC 4.3  HGB 13.5  HCT 42.9  PLT 255  MCV 95.5  MCH 30.1  MCHC 31.5  RDW 15.4   ------------------------------------------------------------------------------------------------------------------  Chemistries  Recent Labs  Lab 07/15/18 1533  NA 139  K 3.8  CL 103  CO2 27  GLUCOSE 173*  BUN 15  CREATININE 1.62*  CALCIUM 8.6*  MG 2.1  AST 24  ALT 9  ALKPHOS 48  BILITOT 1.5*   ------------------------------------------------------------------------------------------------------------------ estimated creatinine clearance is 24.5 mL/min (A) (by C-G formula based on SCr of 1.62 mg/dL (H)). ------------------------------------------------------------------------------------------------------------------ Recent Labs    07/15/18 1623  TSH 3.091    Coagulation profile No results for input(s): INR, PROTIME in the last 168 hours. ------------------------------------------------------------------------------------------------------------------- No results for input(s): DDIMER in the last 72 hours. -------------------------------------------------------------------------------------------------------------------  Cardiac Enzymes Recent Labs  Lab 07/15/18 1623  TROPONINI 0.09*   ------------------------------------------------------------------------------------------------------------------    Component Value Date/Time   BNP 1,151.4 (H) 07/15/2018 1533      ---------------------------------------------------------------------------------------------------------------  Urinalysis    Component Value Date/Time   COLORURINE YELLOW 10/04/2017 [place] 10/04/2017 1413   LABSPEC 1.010 10/04/2017 1413   PHURINE 5.0 10/04/2017 1413   GLUCOSEU NEGATIVE 10/04/2017 [place] 10/04/2017 [place] 10/04/2017 1413   KETONESUR NEGATIVE 10/04/2017 1413   PROTEINUR NEGATIVE 10/04/2017 1413   UROBILINOGEN 0.2 11/16/2012 0345   NITRITE NEGATIVE 10/04/2017 1413   LEUKOCYTESUR NEGATIVE 10/04/2017 1413    ----------------------------------------------------------------------------------------------------------------   Imaging Results:    Dg Chest 2 View  Result Date: 07/15/2018 CLINICAL DATA:  Shortness of breath EXAM: CHEST - 2 VIEW COMPARISON:  10/04/2017 FINDINGS: Small bilateral pleural effusions. Cardiomegaly with vascular congestion and mild pulmonary edema. Atelectasis or pneumonia at the left base. IMPRESSION: 1. Cardiomegaly with vascular congestion, interstitial pulmonary edema and small pleural effusions. 2. Left basilar atelectasis or pneumonia Electronically Signed   By: Donavan Foil M.D.   On: 07/15/2018 16:14    My personal review of EKG: Rhythm A. fib with RVR, Rate  121 /min, QTc 486 , no Acute ST changes   Assessment & Plan:    Active Problems:   HTN (hypertension)   Gout   Diabetes mellitus (HCC)   Acute renal failure (HCC)   Chronic renal insufficiency, stage 3 (moderate) (HCC)   Chronic diastolic CHF (congestive heart failure) (HCC)   S/P AKA (above knee amputation) bilateral (HCC)   Atrial fibrillation with RVR (HCC)  A. fib with RVR -New onset, elevated chads vas 2 score, started on heparin for anticoagulation, heart rate uncontrolled, on Cardizem drip, 5 mg/h, but have remains uncontrolled with soft blood pressure, I will give 1 dose of digoxin then reassess. -Patient  appears to be volume overloaded, will hold on giving fluids -2D echo and cycle troponins -Urology consulted, will await further recommendations  Elevated troponins -Most likely demand ischemia in the setting of A. fib with RVR and CHF, started on heparin GTT, cycle troponins.  Acute on chronic diastolic CHF -With known history of chronic CHF, appears to be volume overloaded with elevated proBNP, volume overload on imaging, pressure is soft, no room for diuresis yet. -Repeat 2D echo  AKI and CKD stage III -Hold nephrotoxic medication, appears to be volume overloaded, will need to diurese well blood pressure improves.  History of peripheral vascular disease -This post bilateral AKA  Diabetes mellitus -Controlled with diet  Hypertension -With history of hypertension, does  not appear to be on any home medications, blood pressure is soft  History of gout -Continue with allopurinol  Psoriasis -Continue with home creams  Neuropathic pain -Continue with gabapentin   DVT Prophylaxis SCDs  AM Labs Ordered, also please review Full Orders  Family Communication: Admission, patients condition and plan of care including tests being ordered have been discussed with the patient and daughter who indicate understanding and agree with the plan and Code Status.  Code Status Full  Likely DC to  Home  Condition GUARDED    Consults called: Cardiology  Admission status: inpatient , given A. fib with RVR, volume overload, requiring heparin GTT, Cardizem GTT, and IV diuresis at one point when renal function improves  Time spent in minutes : 60 minutes   Phillips Climes M.D on 07/15/2018 at 6:05 PM  Between 7am to 7pm - Pager - [phone]. After 7pm go to [email or web address] - password [facility]  [facility] - Office  [phone]

## 2018-07-15 NOTE — Consult Note (Signed)
Cardiology Consultation:   Patient ID: [name] [name]; [id number]; 1926-09-16   Admit date: 07/15/2018 Date of Consult: 07/15/2018  Primary Care Provider: [name], MD Primary Cardiologist: Skeet Latch, MD, New Primary Electrophysiologist:  None   Patient Profile:   [name] [name] is a 85 y.o. male with a hx of CKD III, DM, HTN, HLD, bilat AKAs, ?CHF, PAD, who is being seen today for the evaluation of Afib and CHF at the request of Dr Damita Dunnings.  History of Present Illness:   [name] [name] has a history of dementia.  However, his history seems accurate.  He is unaware of the elevated and irregular HR. He had an episode of presyncope, within the last few days, but it resolved.  He states he was on verapamil at one point and is upset that he is no longer on it but does not know why.  He has been having problems with increasing shortness of breath.  He describes orthopnea and began having PND over the last few days.  He states that started over the weekend.  At one point, he thought about calling 911, but his symptoms improved a bit and he decided to wait.  He is currently feeling better on IV Cardizem and O2.  Had a dose of Lasix 40 mg IV.  He has never had symptoms like this before.   Past Medical History:  Diagnosis Date  . Chronic kidney disease    RENAL INSUFICCIENCY  . Diabetes mellitus without complication ([place])   . DVT (deep venous thrombosis) ([place])   . Gout   . Hypertension   . Lymphadenitis 08/10/2012  . Psoriasis     Past Surgical History:  Procedure Laterality Date  . AMPUTATION Bilateral 10/08/2017   Procedure: Bilateral  ABOVE KNEE amputations;  Surgeon: Conrad [place], MD;  Location: [facility] OR;  Service: Vascular;  Laterality: Bilateral;     Prior to Admission medications   Medication Sig Start Date End Date Taking? Authorizing Provider  gabapentin (NEURONTIN) 100 MG capsule Patient takes 2 Capsule in the Morning, 2 Capsule every evening and 3 Capsules at Bedtime.  Patient taking differently: Take 200-300 mg by mouth See admin instructions. Takes 2 Capsules in the Morning, 2 Capsules every evening and 3 Capsules at Bedtime. 07/01/18 07/15/18 Yes Kinnie Feil, PA-C  allopurinol (ZYLOPRIM) 300 MG tablet Take 1 tablet (300 mg total) by mouth daily. Patient not taking: Reported on 07/15/2018 12/22/17   Fulp, [name] Gaster, MD  Amino Acids-Protein Hydrolys (FEEDING SUPPLEMENT, PRO-STAT SUGAR FREE 64,) LIQD Take 30 mLs by mouth 2 (two) times daily. Patient not taking: Reported on 12/22/2017 11/20/16   Barton Dubois, MD  feeding supplement, ENSURE ENLIVE, (ENSURE ENLIVE) LIQD Take 237 mLs by mouth daily at 2 PM. Patient not taking: Reported on 12/22/2017 11/21/16   Barton Dubois, MD  furosemide (LASIX) 20 MG tablet Take 1 tablet (20 mg total) by mouth daily. Patient not taking: Reported on 07/15/2018 12/22/17   Fulp, [name] Gaster, MD  hydrocerin (EUCERIN) CREA Apply 1 application topically 2 (two) times daily. Patient not taking: Reported on 07/15/2018 10/13/17   Katherine Roan, MD  Multiple Vitamin (MULTIVITAMIN WITH MINERALS) TABS tablet Take 1 tablet by mouth daily. Patient not taking: Reported on 07/15/2018 10/13/17   Katherine Roan, MD  protein supplement shake (PREMIER PROTEIN) LIQD Take 325 mLs (11 oz total) by mouth daily. Patient not taking: Reported on 07/15/2018 10/13/17   Katherine Roan, MD  ramelteon (ROZEREM) 8 MG tablet Take 1 tablet (8  mg total) by mouth at bedtime. Patient not taking: Reported on 07/15/2018 10/13/17   Katherine Roan, MD  sodium chloride (OCEAN) 0.65 % SOLN nasal spray Place 1-2 sprays into both nostrils as needed for congestion. Patient not taking: Reported on 07/15/2018 10/13/17   Katherine Roan, MD  traMADol (ULTRAM) 50 MG tablet Take 2 tablets (100 mg total) by mouth every 12 (twelve) hours as needed for moderate pain. Patient not taking: Reported on 07/15/2018 12/22/17   Fulp, [name] Gaster, MD  triamcinolone ointment (KENALOG) 0.1 % Apply  1 application topically 2 (two) times daily. To affected skin areas as needed Patient not taking: Reported on 07/15/2018 12/22/17   [name], MD    Inpatient Medications: Scheduled Meds: . allopurinol  300 mg Oral Daily  . digoxin  0.25 mg Intravenous Once  . feeding supplement (PRO-STAT SUGAR FREE 64)  30 mL Oral BID  . gabapentin  200-300 mg Oral See admin instructions  . hydrocerin  1 application Topical BID  . triamcinolone ointment  1 application Topical BID   Continuous Infusions: . diltiazem (CARDIZEM) infusion 5 mg/hr (07/15/18 1626)  . heparin 1,300 Units/hr (07/15/18 1746)   PRN Meds:   Allergies:   No Known Allergies  Social History:   Social History   Socioeconomic History  . Marital status: Widowed    Spouse name: Not on file  . Number of children: Not on file  . Years of education: Not on file  . Highest education level: Not on file  Occupational History  . Occupation: Retired  Scientific laboratory technician  . Financial resource strain: Not on file  . Food insecurity:    Worry: Not on file    Inability: Not on file  . Transportation needs:    Medical: Not on file    Non-medical: Not on file  Tobacco Use  . Smoking status: Former Smoker    Years: 40.00    Types: Pipe  . Smokeless tobacco: Never Used  Substance and Sexual Activity  . Alcohol use: No  . Drug use: No  . Sexual activity: Not on file  Lifestyle  . Physical activity:    Days per week: Not on file    Minutes per session: Not on file  . Stress: Not on file  Relationships  . Social connections:    Talks on phone: Not on file    Gets together: Not on file    Attends religious service: Not on file    Active member of club or organization: Not on file    Attends meetings of clubs or organizations: Not on file    Relationship status: Not on file  . Intimate partner violence:    Fear of current or ex partner: Not on file    Emotionally abused: Not on file    Physically abused: Not on file    Forced  sexual activity: Not on file  Other Topics Concern  . Not on file  Social History Narrative   He has a room in his daughter's house.    Family History:   Family History  Problem Relation Age of Onset  . Hypertension Mother   . Stroke Mother   . Hypertension Father    Family Status:  Family Status  Relation Name Status  . Mother  Deceased at age 53s  . Father  Deceased at age 71    ROS:  Please see the history of present illness.  All other ROS reviewed and negative.  Physical Exam/Data:   Vitals:   07/15/18 1715 07/15/18 1730 07/15/18 1800 07/15/18 1830  BP: (!) 112/92 119/81 (!) 113/98 115/77  Pulse: 75 (!) 51  74  Resp: (!) 21 15 18 20   SpO2: 97% 94%  97%  Weight:      Height:       No intake or output data in the 24 hours ending 07/15/18 1856 Filed Weights   07/15/18 1612  Weight: 106.6 kg   Body mass index is 71.71 kg/m.  General:  Well nourished, elderly male, in mild respiratory distress HEENT: normal Lymph: no adenopathy Neck: JVD seen but difficult to assess due to body habitus Endocrine:  No thryomegaly Vascular: No carotid bruits; upper extremity pulses 2+, bilateral lower extremity AKA Cardiac:  normal S1, S2; rapid and irregular; no murmur  Lungs: Rales bases bilaterally, no wheezing, rhonchi Abd: soft, nontender, no hepatomegaly  Ext: no edema Musculoskeletal:  No deformities, BUE strength normal for age and equal Skin: warm and dry, multiple psoriatic plaques seen Neuro:  CNs 2-12 intact, no focal abnormalities noted Psych:  Normal affect   EKG:  The EKG was personally reviewed and demonstrates: Atrial fib, RVR, heart rate 121, no acute ischemic changes Telemetry:  Telemetry was personally reviewed and demonstrates: Atrial fib, RVR  Relevant CV Studies:  ECHO: 08/06/2012 - Left ventricle: The cavity size was normal. Wall thickness was increased in a pattern of mild LVH. There was mild focal basal hypertrophy of the septum. Systolic  function was normal. The estimated ejection fraction was in the range of 55% to 60%. Wall motion was normal; there were no regional wall motion abnormalities. Features are consistent with a pseudonormal left ventricular filling pattern, with concomitant abnormal relaxation and increased filling pressure (grade 2 diastolic dysfunction). - Mitral valve: Mild regurgitation. - Left atrium: The atrium was mildly dilated.  CATH: n/a  Laboratory Data:  Chemistry Recent Labs  Lab 07/15/18 1533  NA 139  K 3.8  CL 103  CO2 27  GLUCOSE 173*  BUN 15  CREATININE 1.62*  CALCIUM 8.6*  GFRNONAA 37*  GFRAA 43*  ANIONGAP 9    Lab Results  Component Value Date   ALT 9 07/15/2018   AST 24 07/15/2018   ALKPHOS 48 07/15/2018   BILITOT 1.5 (H) 07/15/2018   Hematology Recent Labs  Lab 07/15/18 1533  WBC 4.3  RBC 4.49  HGB 13.5  HCT 42.9  MCV 95.5  MCH 30.1  MCHC 31.5  RDW 15.4  PLT 255   Cardiac Enzymes Recent Labs  Lab 07/15/18 1623  TROPONINI 0.09*     BNP Recent Labs  Lab 07/15/18 1533  BNP 1,151.4*    TSH:  Lab Results  Component Value Date   TSH 3.091 07/15/2018   Lipids: Lab Results  Component Value Date   CHOL 222 (H) 05/10/2008   HDL 51 05/10/2008   LDLCALC 136 (H) 05/10/2008   TRIG 176 (H) 05/10/2008   CHOLHDL 4.4 Ratio 05/10/2008   HgbA1c: Lab Results  Component Value Date   HGBA1C 6.7 (H) 10/05/2017   Magnesium:  Magnesium  Date Value Ref Range Status  07/15/2018 2.1 1.7 - 2.4 mg/dL Final    Comment:    Performed at [facility], [place] [address], [place], [place] [address]     Radiology/Studies:  Dg Chest 2 View  Result Date: 07/15/2018 CLINICAL DATA:  Shortness of breath EXAM: CHEST - 2 VIEW COMPARISON:  10/04/2017 FINDINGS: Small bilateral pleural effusions. Cardiomegaly  with vascular congestion and mild pulmonary edema. Atelectasis or pneumonia at the left base. IMPRESSION: 1. Cardiomegaly with vascular  congestion, interstitial pulmonary edema and small pleural effusions. 2. Left basilar atelectasis or pneumonia Electronically Signed   By: Donavan Foil M.D.   On: 07/15/2018 16:14    Assessment and Plan:   1.  Atrial fibrillation, RVR - Duration is unknown, but patient symptoms seem to have started fairly recently - He is on heparin and IV Cardizem. -Titrate the Cardizem for better heart rate control - He was on verapamil 180 mg daily for long time, it was discontinued in June 2019, when he was hospitalized for his AKAs.  I have not yet found a reason for that - Hopefully, his heart rate will stabilize overnight and we can change to p.o. Cardizem in a.m.  2.  Acute CHF: - He has a repeat echocardiogram ordered, EF was previously normal but that was 6 years ago - Continue diuresis with Lasix 40 mg IV twice daily, follow BUN and creatinine closely -Track daily weights, strict intake/output -If EF is decreased, will need to try to find another option for rate control.  3.  Anticoagulation: -His This patients CHA2DS2-VASc Score and unadjusted Ischemic Stroke Rate (% per year) is equal to 11.2 % stroke rate/year from a score of 7 Above score calculated as 1 point each if present [CHF, HTN, DM, Vascular=MI/PAD/Aortic Plaque, Age if 65-74, or Male], 2 points each if present [Age > 75, or Stroke/TIA/TE] -She is on heparin at this time, once he is more stable and no invasive work-up is needed, changed to oral meds  Otherwise, per IM Active Problems:   HTN (hypertension)   Gout   Diabetes mellitus (HCC)   Acute renal failure (HCC)   Chronic renal insufficiency, stage 3 (moderate) (HCC)   Chronic diastolic CHF (congestive heart failure) (HCC)   S/P AKA (above knee amputation) bilateral (HCC)   Atrial fibrillation with RVR ([place])   For questions or updates, please contact [facility] Please consult [email or web address] for contact info under Cardiology/STEMI.   Jonetta Speak, PA-C   07/15/2018 6:56 PM

## 2018-07-15 NOTE — ED Notes (Signed)
ED TO INPATIENT HANDOFF REPORT  ED Nurse Name and Phone #: [phone]  S Name/Age/Gender [name] [name] 85 y.o. male Room/Bed: 022C/022C  Code Status   Code Status: Prior  Home/SNF/Other Home Patient oriented to: self, place, time and situation Is this baseline? Yes   Triage Complete: Triage complete  Chief Complaint Tachycardia  Triage Note [facility]- [facility]. Pt was sent here due to tachycardia. Reported HR between 130-140s.   BP 118/88 96% RA CBG 96   Allergies No Known Allergies  Level of Care/Admitting Diagnosis ED Disposition    ED Disposition Condition [facility] Area: [place] [[id number]]  Level of Care: Telemetry Cardiac [103]  Diagnosis: Atrial fibrillation with RVR [facility]) [[id number]]  Admitting Physician: Manfred Shirts  Attending Physician: Waldron Labs, DAWOOD S [[id number]]  Estimated length of stay: past midnight tomorrow  Certification:: I certify this patient will need inpatient services for at least 2 midnights  PT Class (Do Not Modify): Inpatient [101]  PT Acc Code (Do Not Modify): Private [1]       B Medical/Surgery History Past Medical History:  Diagnosis Date  . Chronic kidney disease    RENAL INSUFICCIENCY  . Diabetes mellitus without complication ([place])   . DVT (deep venous thrombosis) ([place])   . Gout   . Hypertension   . Lymphadenitis 08/10/2012  . Psoriasis    Past Surgical History:  Procedure Laterality Date  . AMPUTATION Bilateral 10/08/2017   Procedure: Bilateral  ABOVE KNEE amputations;  Surgeon: Conrad Grandwood Park, MD;  Location: [facility] OR;  Service: Vascular;  Laterality: Bilateral;     A IV Location/Drains/Wounds Patient Lines/Drains/Airways Status   Active Line/Drains/Airways    Name:   Placement date:   Placement time:   Site:   Days:   Peripheral IV 07/15/18 Left Antecubital   07/15/18    1530    Antecubital   less than 1   Peripheral IV 07/15/18 Right Forearm    07/15/18    1735    Forearm   less than 1   External Urinary Catheter   10/08/17    0900    -   280   Incision (Closed) 10/08/17 Leg Right   10/08/17    1540     280   Incision (Closed) 10/08/17 Leg Left   10/08/17    1540     280   Wound / Incision (Open or Dehisced) 10/18/14 Other (Comment) Sacrum   10/18/14    2304    Sacrum   1366          Intake/Output Last 24 hours No intake or output data in the 24 hours ending 07/15/18 1755  Labs/Imaging Results for orders placed or performed during the hospital encounter of 07/15/18 (from the past 48 hour(s))  Magnesium     Status: None   Collection Time: 07/15/18  3:33 PM  Result Value Ref Range   Magnesium 2.1 1.7 - 2.4 mg/dL    Comment: Performed at [facility], [place] [address] [place] [address]  CBC     Status: Abnormal   Collection Time: 07/15/18  3:33 PM  Result Value Ref Range   WBC 4.3 4.0 - 10.5 K/uL   RBC 4.49 4.22 - 5.81 MIL/uL   Hemoglobin 13.5 13.0 - 17.0 g/dL   HCT 42.9 39.0 - 52.0 %   MCV 95.5 80.0 - 100.0 fL   MCH 30.1 26.0 -  34.0 pg   MCHC 31.5 30.0 - 36.0 g/dL   RDW 15.4 11.5 - 15.5 %   Platelets 255 150 - 400 K/uL   nRBC 0.5 (H) 0.0 - 0.2 %    Comment: Performed at [facility] [address], [place], [place] [address]  Brain natriuretic peptide (IF shortness of breath has been documented this visit)     Status: Abnormal   Collection Time: 07/15/18  3:33 PM  Result Value Ref Range   B Natriuretic Peptide 1,151.4 (H) 0.0 - 100.0 pg/mL    Comment: Performed at [facility] [address], [place], [place] [address]  Comprehensive metabolic panel     Status: Abnormal   Collection Time: 07/15/18  3:33 PM  Result Value Ref Range   Sodium 139 135 - 145 mmol/L   Potassium 3.8 3.5 - 5.1 mmol/L   Chloride 103 98 - 111 mmol/L   CO2 27 22 - 32 mmol/L   Glucose, Bld 173 (H) 70 - 99 mg/dL   BUN 15 8 - 23 mg/dL   Creatinine, Ser 1.62 (H) 0.61 - 1.24 mg/dL   Calcium 8.6 (L) 8.9 - 10.3 mg/dL    Total Protein 7.0 6.5 - 8.1 g/dL   Albumin 2.8 (L) 3.5 - 5.0 g/dL   AST 24 15 - 41 U/L   ALT 9 0 - 44 U/L   Alkaline Phosphatase 48 38 - 126 U/L   Total Bilirubin 1.5 (H) 0.3 - 1.2 mg/dL   GFR calc non Af Amer 37 (L) >60 mL/min   GFR calc Af Amer 43 (L) >60 mL/min   Anion gap 9 5 - 15    Comment: Performed at [facility] [address] [place], [place] [address]  TSH     Status: None   Collection Time: 07/15/18  4:23 PM  Result Value Ref Range   TSH 3.091 0.350 - 4.500 uIU/mL    Comment: Performed by a 3rd Generation assay with a functional sensitivity of <=0.01 uIU/mL. Performed at [facility], [place] [address], [place], [place] [address]   Troponin I - ONCE - STAT     Status: Abnormal   Collection Time: 07/15/18  4:23 PM  Result Value Ref Range   Troponin I 0.09 (HH) <0.03 ng/mL    Comment: CRITICAL RESULT CALLED TO, READ BACK BY AND VERIFIED WITH: Rudean Curt 2725 07/15/2018 WBOND Performed at [facility], [place] [address] [place], [place] [address]    Dg Chest 2 View  Result Date: 07/15/2018 CLINICAL DATA:  Shortness of breath EXAM: CHEST - 2 VIEW COMPARISON:  10/04/2017 FINDINGS: Small bilateral pleural effusions. Cardiomegaly with vascular congestion and mild pulmonary edema. Atelectasis or pneumonia at the left base. IMPRESSION: 1. Cardiomegaly with vascular congestion, interstitial pulmonary edema and small pleural effusions. 2. Left basilar atelectasis or pneumonia Electronically Signed   By: Donavan Foil M.D.   On: 07/15/2018 16:14    Pending Labs Unresulted Labs (From admission, onward)    Start     Ordered   07/17/18 0500  Heparin level (unfractionated)  Daily,   R     07/15/18 1715   07/16/18 0500  CBC  Daily,   R     07/15/18 1715   07/16/18 0130  Heparin level (unfractionated)  Once-Timed,   R     07/15/18 1715   Signed and Held  Basic metabolic panel  Tomorrow morning,   R  Signed and Held   Signed and Held  CBC  Tomorrow morning,    R     Signed and Held   Signed and Held  Basic metabolic panel  Daily,   R     Signed and Held          Vitals/Pain Today's Vitals   07/15/18 1645 07/15/18 1700 07/15/18 1715 07/15/18 1730  BP: 107/64 (!) 127/111 (!) 112/92 119/81  Pulse:   75 (!) 51  Resp: 14 (!) 27 (!) 21 15  SpO2:   97% 94%  Weight:      Height:      PainSc:        Isolation Precautions No active isolations  Medications Medications  diltiazem (CARDIZEM) 1 mg/mL load via infusion 15 mg (15 mg Intravenous Bolus from Bag 07/15/18 1626)    And  diltiazem (CARDIZEM) 100 mg in dextrose 5% 128mL (1 mg/mL) infusion (5 mg/hr Intravenous New Bag/Given 07/15/18 1626)  heparin ADULT infusion 100 units/mL (25000 units/267mL sodium chloride 0.45%) (1,300 Units/hr Intravenous New Bag/Given 07/15/18 1746)  furosemide (LASIX) injection 40 mg (40 mg Intravenous Given 07/15/18 1733)  heparin bolus via infusion 4,000 Units (4,000 Units Intravenous Bolus from Bag 07/15/18 1747)    Mobility power wheelchair High fall risk   Focused Assessments Cardiac Assessment Handoff:  Cardiac Rhythm: Sinus tachycardia Lab Results  Component Value Date   CKTOTAL 143 10/05/2017   CKMB 5.9 (H) 11/15/2012   TROPONINI 0.09 (HH) 07/15/2018   Lab Results  Component Value Date   DDIMER 3.39 (H) 11/16/2012   Does the Patient currently have chest pain? No     R Recommendations: See Admitting Provider Note  Report given to:   Additional Notes: .

## 2018-07-15 NOTE — ED Notes (Signed)
Main lab called about critical troponin of 0.09.

## 2018-07-15 NOTE — ED Provider Notes (Signed)
[facility] EMERGENCY DEPARTMENT Provider Note   CSN: [id number] Arrival date & time: 07/15/18  1517    History   Chief Complaint No chief complaint on file.   HPI [name] [name] is a 85 y.o. male with extensive past medical history as noted below who presents the emergency department from clinic for tachycardia that was present upon arrival to the clinic.  Patient was scheduled to have routine establishment of care appointment earlier today when he was found to be tachycardic.  Patient has a history of diabetes, hypertension, CKD stage III, CHF, and peripheral artery disease.  He states that he has not had any palpitations or chest pain over the course of the last few days.  The only symptoms that he states that he has noticed is dry hacking cough and some mild shortness of breath that started over the weekend.  Patient states that he is staying with a family member who keeps the house very hot.  He states that he attempted using a fan which she believes contributed to his development of a cough.  He states that he took NyQuil which has controlled his cough quite well.      Illness  Severity:  Mild Onset quality:  Gradual Duration:  5 days Timing:  Intermittent Progression:  Improving Chronicity:  New Associated symptoms: cough, rash (chronic psoariatic ) and shortness of breath   Associated symptoms: no abdominal pain, no chest pain, no ear pain, no fever, no sore throat and no vomiting     Past Medical History:  Diagnosis Date  . Chronic kidney disease    RENAL INSUFICCIENCY  . Diabetes mellitus without complication ([place])   . DVT (deep venous thrombosis) ([place])   . Gout   . Hypertension   . Lymphadenitis 08/10/2012  . Psoriasis     Patient Active Problem List   Diagnosis Date Noted  . Atrial fibrillation with RVR ([place]) 07/15/2018  . S/P AKA (above knee amputation) bilateral ([place])   . Pressure injury of skin 10/04/2017  . Hyperkalemia 11/17/2016  .  Venous stasis ulcer ([place]) 11/17/2016  . Venous stasis ulcer of lower extremity ([place]) 05/23/2015  . Cellulitis of both lower extremities 10/18/2014  . Chronic renal insufficiency, stage 3 (moderate) ([place]) 10/18/2014  . HTN (hypertension), benign 10/18/2014  . Chronic diastolic CHF (congestive heart failure) ([place]) 10/18/2014  . Lower extremity cellulitis 10/18/2014  . PAD (peripheral artery disease) ([place]) 02/02/2014  . Atherosclerosis of native arteries of the extremities with ulceration ([place]) 07/22/2013  . Acute renal failure ([place]) 11/15/2012  . Fall 11/15/2012  . Diabetic ulcer of left foot ([place]) 10/15/2012  . Diabetes mellitus ([place]) 10/15/2012  . Lymphadenitis 08/07/2012  . Cellulitis 08/07/2012  . HTN (hypertension) 08/05/2012  . Renal insufficiency 08/05/2012  . Tobacco abuse 08/05/2012  . Gout 08/05/2012  . Hyperglycemia 08/05/2012    Past Surgical History:  Procedure Laterality Date  . AMPUTATION Bilateral 10/08/2017   Procedure: Bilateral  ABOVE KNEE amputations;  Surgeon: Conrad Philomath, MD;  Location: [facility] OR;  Service: Vascular;  Laterality: Bilateral;        Home Medications    Prior to Admission medications   Medication Sig Start Date End Date Taking? Authorizing Provider  gabapentin (NEURONTIN) 100 MG capsule Patient takes 2 Capsule in the Morning, 2 Capsule every evening and 3 Capsules at Bedtime. Patient taking differently: Take 200-300 mg by mouth See admin instructions. Takes 2 Capsules in the Morning, 2 Capsules every evening and 3 Capsules  at Bedtime. 07/01/18 07/15/18 Yes Kinnie Feil, PA-C  allopurinol (ZYLOPRIM) 300 MG tablet Take 1 tablet (300 mg total) by mouth daily. Patient not taking: Reported on 07/15/2018 12/22/17   Fulp, Ander Gaster, MD  Amino Acids-Protein Hydrolys (FEEDING SUPPLEMENT, PRO-STAT SUGAR FREE 64,) LIQD Take 30 mLs by mouth 2 (two) times daily. Patient not taking: Reported on 12/22/2017 11/20/16   Barton Dubois, MD  feeding supplement, ENSURE  ENLIVE, (ENSURE ENLIVE) LIQD Take 237 mLs by mouth daily at 2 PM. Patient not taking: Reported on 12/22/2017 11/21/16   Barton Dubois, MD  furosemide (LASIX) 20 MG tablet Take 1 tablet (20 mg total) by mouth daily. Patient not taking: Reported on 07/15/2018 12/22/17   Fulp, Ander Gaster, MD  hydrocerin (EUCERIN) CREA Apply 1 application topically 2 (two) times daily. Patient not taking: Reported on 07/15/2018 10/13/17   Katherine Roan, MD  Multiple Vitamin (MULTIVITAMIN WITH MINERALS) TABS tablet Take 1 tablet by mouth daily. Patient not taking: Reported on 07/15/2018 10/13/17   Katherine Roan, MD  protein supplement shake (PREMIER PROTEIN) LIQD Take 325 mLs (11 oz total) by mouth daily. Patient not taking: Reported on 07/15/2018 10/13/17   Katherine Roan, MD  ramelteon (ROZEREM) 8 MG tablet Take 1 tablet (8 mg total) by mouth at bedtime. Patient not taking: Reported on 07/15/2018 10/13/17   Katherine Roan, MD  sodium chloride (OCEAN) 0.65 % SOLN nasal spray Place 1-2 sprays into both nostrils as needed for congestion. Patient not taking: Reported on 07/15/2018 10/13/17   Katherine Roan, MD  traMADol (ULTRAM) 50 MG tablet Take 2 tablets (100 mg total) by mouth every 12 (twelve) hours as needed for moderate pain. Patient not taking: Reported on 07/15/2018 12/22/17   Fulp, Ander Gaster, MD  triamcinolone ointment (KENALOG) 0.1 % Apply 1 application topically 2 (two) times daily. To affected skin areas as needed Patient not taking: Reported on 07/15/2018 12/22/17   [name], MD    Family History Family History  Problem Relation Age of Onset  . Hypertension Mother   . Stroke Mother   . Hypertension Father     Social History Social History   Tobacco Use  . Smoking status: Former Smoker    Years: 40.00    Types: Pipe  . Smokeless tobacco: Never Used  Substance Use Topics  . Alcohol use: No  . Drug use: No     Allergies   Patient has no known allergies.   Review of Systems Review  of Systems  Constitutional: Negative for chills and fever.  HENT: Negative for ear pain and sore throat.   Eyes: Negative for pain and visual disturbance.  Respiratory: Positive for cough and shortness of breath.   Cardiovascular: Negative for chest pain and palpitations.  Gastrointestinal: Negative for abdominal pain and vomiting.  Genitourinary: Negative for dysuria and hematuria.  Musculoskeletal: Negative for arthralgias and back pain.  Skin: Positive for rash (chronic psoariatic ). Negative for color change.  Neurological: Negative for seizures and syncope.  All other systems reviewed and are negative.    Physical Exam Updated Vital Signs BP 113/86   Pulse 88   Temp 98 F (36.7 C) (Oral)   Resp 18   Ht 4' (1.219 m)   Wt 82.8 kg   SpO2 100%   BMI 55.70 kg/m   Physical Exam Vitals signs and nursing note reviewed.  Constitutional:      Appearance: He is well-developed.     Comments: Chronically ill-appearing male.  HENT:  Head: Normocephalic and atraumatic.  Eyes:     Conjunctiva/sclera: Conjunctivae normal.  Neck:     Musculoskeletal: Neck supple.  Cardiovascular:     Rate and Rhythm: Tachycardia present. Rhythm irregular.     Heart sounds: No murmur.  Pulmonary:     Effort: Pulmonary effort is normal. No respiratory distress.     Breath sounds: Normal breath sounds.  Abdominal:     Palpations: Abdomen is soft.     Tenderness: There is no abdominal tenderness.  Musculoskeletal:     Comments: Bilateral AKA.  Well-healed surgical scars.  Skin:    General: Skin is warm and dry.     Comments: Scattered psoariatic rashes over his back. No evidence of surrounding erythema, edema, or induration.   Neurological:     Mental Status: He is alert.      ED Treatments / Results  Labs (all labs ordered are listed, but only abnormal results are displayed) Labs Reviewed  CBC - Abnormal; Notable for the following components:      Result Value   nRBC 0.5 (*)     All other components within normal limits  BRAIN NATRIURETIC PEPTIDE - Abnormal; Notable for the following components:   B Natriuretic Peptide 1,151.4 (*)    All other components within normal limits  COMPREHENSIVE METABOLIC PANEL - Abnormal; Notable for the following components:   Glucose, Bld 173 (*)    Creatinine, Ser 1.62 (*)    Calcium 8.6 (*)    Albumin 2.8 (*)    Total Bilirubin 1.5 (*)    GFR calc non Af Amer 37 (*)    GFR calc Af Amer 43 (*)    All other components within normal limits  TROPONIN I - Abnormal; Notable for the following components:   Troponin I 0.09 (*)    All other components within normal limits  TROPONIN I - Abnormal; Notable for the following components:   Troponin I 0.10 (*)    All other components within normal limits  MAGNESIUM  TSH  HEPARIN LEVEL (UNFRACTIONATED)  CBC  TROPONIN I  BASIC METABOLIC PANEL  CBC    EKG EKG Interpretation  Date/Time:  Thursday July 15 2018 15:19:25 EDT Ventricular Rate:  131 PR Interval:    QRS Duration: 95 QT Interval:  348 QTC Calculation: 524 R Axis:   36 Text Interpretation:  Atrial flutter Low voltage, extremity leads Consider anterior infarct Nonspecific T abnormalities, inferior leads Prolonged QT interval flutter new from previous Confirmed by Theotis Burrow [phone]) on 07/15/2018 3:22:45 PM   Radiology Dg Chest 2 View  Result Date: 07/15/2018 CLINICAL DATA:  Shortness of breath EXAM: CHEST - 2 VIEW COMPARISON:  10/04/2017 FINDINGS: Small bilateral pleural effusions. Cardiomegaly with vascular congestion and mild pulmonary edema. Atelectasis or pneumonia at the left base. IMPRESSION: 1. Cardiomegaly with vascular congestion, interstitial pulmonary edema and small pleural effusions. 2. Left basilar atelectasis or pneumonia Electronically Signed   By: Donavan Foil M.D.   On: 07/15/2018 16:14    Procedures Procedures (including critical care time)  Medications Ordered in ED Medications  diltiazem  (CARDIZEM) 1 mg/mL load via infusion 15 mg (15 mg Intravenous Bolus from Bag 07/15/18 1626)    And  diltiazem (CARDIZEM) 100 mg in dextrose 5% 131mL (1 mg/mL) infusion (5 mg/hr Intravenous New Bag/Given 07/15/18 1626)  heparin ADULT infusion 100 units/mL (25000 units/231mL sodium chloride 0.45%) (1,300 Units/hr Intravenous New Bag/Given 07/15/18 1746)  acetaminophen (TYLENOL) tablet 650 mg (650 mg Oral Given 07/15/18 2228)  Or  acetaminophen (TYLENOL) suppository 650 mg ( Rectal See Alternative 07/15/18 2228)  albuterol (PROVENTIL) (2.5 MG/3ML) 0.083% nebulizer solution 2.5 mg (has no administration in time range)  digoxin (LANOXIN) 0.25 MG/ML injection 0.25 mg (has no administration in time range)  feeding supplement (PRO-STAT SUGAR FREE 64) liquid 30 mL (30 mLs Oral Given 07/15/18 2227)  hydrocerin (EUCERIN) cream 1 application (1 application Topical Given 07/15/18 2227)  allopurinol (ZYLOPRIM) tablet 300 mg (300 mg Oral Given 07/15/18 2227)  triamcinolone ointment (KENALOG) 0.1 % 1 application (has no administration in time range)  gabapentin (NEURONTIN) capsule 200 mg (has no administration in time range)    And  gabapentin (NEURONTIN) capsule 300 mg (300 mg Oral Given 07/15/18 2227)  furosemide (LASIX) injection 40 mg (40 mg Intravenous Given 07/15/18 1733)  heparin bolus via infusion 4,000 Units (4,000 Units Intravenous Bolus from Bag 07/15/18 1747)     Initial Impression / Assessment and Plan / ED Course  I have reviewed the triage vital signs and the nursing notes.  Pertinent labs & imaging results that were available during my care of the patient were reviewed by me and considered in my medical decision making (see chart for details).        Patient is an 85 year old male with multiple comorbidities who presents the emergency department after tachycardia was noted on establishment of care visit earlier today.  Patient states the only symptoms he is noticed over the course of last few  days has been nonproductive cough and mild shortness of breath.  He denies any sick contacts or recent fevers.  Initial evaluation of the patient he was hemodynamically stable, tachycardic, normotensive, and satting appropriately on room air.   EKG was obtained which showed atrial fibrillation with rapid ventricular response at 133 bpm.  Patient's chart was reviewed and this appears to be the first episode of A. fib/flutter within the patient's medical record.  He is not currently anticoagulated with unknown duration of A. fib/flutter.  Cardioversion is not recommended at this time.  CHADS-VASc score of 6 with unknown duration leaves patient hight risk for CVA.  Cardioversion not recommended at this time.  Given patient's hemodynamic stability rate control therapy was initiated with IV diltiazem.  Chest x-ray shows cardiomegaly with vascular congestion and interstitial pulmonary edema with small pleural effusions.  Left basilar opacification favored to reflect atelectasis given afebrile status and no leukocytosis to suggest pneumonia.  Metabolic panel shows elevation in creatinine of 1.62 will compare to previous studies.  No significant electrolyte derangements are present.  TSH within normal limits.  BNP markedly elevated at 1151.  This in conjunction with the patient's x-ray concerning for acute on chronic heart failure exacerbation which may be contributing to current arrhythmia.  Patient was given IV Lasix for diuresis.  Mild troponin elevation at 0.09, however this is not unexpected with the patient's CKD status as well as arrhythmia now for likely a few days.  Do not feel this is related to type I cardiac injury.  Given patient's elevated CHADS-VASc he was anticoagulated with IV heparin.  Case was discussed with Dr. Waldron Labs with internal medicine who is in agreement with admission at this time.  Cardiology was consulted per inpatient team request and was made aware of the patient.  No  additional emergent therapies recommended by cardiology at this time.  Patient was in stable condition at time of admission.        Final Clinical Impressions(s) / ED Diagnoses   Final diagnoses:  Atrial fibrillation and flutter ([place])  Acute on chronic heart failure, unspecified heart failure type [name])    ED Discharge Orders         Ordered    Amb referral to AFIB Clinic     07/15/18 1534           Tommie Raymond, MD 07/15/18 [place] [facility], Wenda Overland, MD 07/18/18 1151

## 2018-07-15 NOTE — Progress Notes (Signed)
Established Patient Office Visit  Subjective:  Patient ID: [name] [name], male    DOB: May 12, 1926  Age: 85 y.o. MRN: [id number]  CC: not feeling well per CMA's report Chief Complaint  Patient presents with  . Follow-up    DM + Neuropathy    HPI [name] [name], 85 yo male last seen in this office on 12/19/17 when he presented to establish as a new patient.  Patient is status post recent emergency department visit on 07/01/2018 secondary to needing refill of gabapentin as well as complaint of ongoing phantom limb pain.  Patient has past medical history significant for diabetes, hypertension, chronic renal insufficiency stage III, chronic diastolic CHF, peripheral arterial disease and gout.  Patient reports recurrent, nonproductive cough as well as some shortness of breath since this weekend.  Patient has also had recurrent episodes of dizziness.  Patient denies any chest pain.  Patient denies any fever or chills.  Patient reports that when he was admitted and had surgery/bilateral above-the-knee amputations, his medicines were all changed around and patient believes that this may be why his heart rate is high at today's visit.       Patient with a 4:10 pm appointment however he was dropped off around 1:30 by [facility].  Patient reports that he has otherwise felt as if he was in his normal state of health until he had onset of shortness of breath and cough this weekend.  Patient reports that he has been taking his medications.  Patient denies any increased thirst or urinary frequency.  Patient has felt as if he is urinating more frequently but only gets a small amount of output out at a time.  Patient denies any increased muscle or joint pain, patient has had no recent gout flareups.  Patient does continue to have sensation of a burning type pain and feels as if his legs are still present.  Patient thinks that the recent increase in gabapentin that he was given in the emergency department has helped with the pain in  his legs.  Patient denies any headaches related to his blood pressure but patient reports that he has had some recent issues with recurrent dizziness.  Patient continues to chronically have issues with psoriasis.  Patient's daughter reports that patient has had at least 2 other appointments here for follow-up however daughter states that patient is very stubborn and canceled his prior appointments.  Past Medical History:  Diagnosis Date  . Chronic kidney disease    RENAL INSUFICCIENCY  . Diabetes mellitus without complication ([place])   . DVT (deep venous thrombosis) ([place])   . Gout   . Hypertension   . Lymphadenitis 08/10/2012  . Psoriasis     Past Surgical History:  Procedure Laterality Date  . AMPUTATION Bilateral 10/08/2017   Procedure: Bilateral  ABOVE KNEE amputations;  Surgeon: Conrad Ceiba, MD;  Location: [facility] OR;  Service: Vascular;  Laterality: Bilateral;    Family History  Problem Relation Age of Onset  . Hypertension Mother   . Stroke Mother   . Hypertension Father     Social History   Socioeconomic History  . Marital status: Widowed    Spouse name: Not on file  . Number of children: Not on file  . Years of education: Not on file  . Highest education level: Not on file  Occupational History  . Not on file  Social Needs  . Financial resource strain: Not on file  . Food insecurity:    Worry: Not on  file    Inability: Not on file  . Transportation needs:    Medical: Not on file    Non-medical: Not on file  Tobacco Use  . Smoking status: Former Smoker    Years: 40.00    Types: Pipe  . Smokeless tobacco: Never Used  Substance and Sexual Activity  . Alcohol use: No  . Drug use: No  . Sexual activity: Not on file  Lifestyle  . Physical activity:    Days per week: Not on file    Minutes per session: Not on file  . Stress: Not on file  Relationships  . Social connections:    Talks on phone: Not on file    Gets together: Not on file    Attends religious  service: Not on file    Active member of club or organization: Not on file    Attends meetings of clubs or organizations: Not on file    Relationship status: Not on file  . Intimate partner violence:    Fear of current or ex partner: Not on file    Emotionally abused: Not on file    Physically abused: Not on file    Forced sexual activity: Not on file  Other Topics Concern  . Not on file  Social History Narrative  . Not on file   No Known Allergies  Outpatient Medications Prior to Visit  Medication Sig Dispense Refill  . allopurinol (ZYLOPRIM) 300 MG tablet Take 1 tablet (300 mg total) by mouth daily. 30 tablet 6  . colchicine 0.6 MG tablet Take 0.6 mg by mouth daily.    Marland Kitchen docusate sodium (COLACE) 100 MG capsule Take 100 mg by mouth 2 (two) times daily.    . furosemide (LASIX) 20 MG tablet Take 1 tablet (20 mg total) by mouth daily. 30 tablet 6  . gabapentin (NEURONTIN) 100 MG capsule Patient takes 2 Capsule in the Morning, 2 Capsule every evening and 3 Capsules at Bedtime. 210 capsule 0  . hydrocerin (EUCERIN) CREA Apply 1 application topically 2 (two) times daily.  0  . Multiple Vitamin (MULTIVITAMIN WITH MINERALS) TABS tablet Take 1 tablet by mouth daily.    . protein supplement shake (PREMIER PROTEIN) LIQD Take 325 mLs (11 oz total) by mouth daily.  0  . ramelteon (ROZEREM) 8 MG tablet Take 1 tablet (8 mg total) by mouth at bedtime.    . sodium chloride (OCEAN) 0.65 % SOLN nasal spray Place 1-2 sprays into both nostrils as needed for congestion.  0  . traMADol (ULTRAM) 50 MG tablet Take 2 tablets (100 mg total) by mouth every 12 (twelve) hours as needed for moderate pain. 30 tablet 3  . triamcinolone ointment (KENALOG) 0.1 % Apply 1 application topically 2 (two) times daily. To affected skin areas as needed 30 g 6  . Vitamin D, Ergocalciferol, (DRISDOL) 50000 units CAPS capsule Take 50,000 Units by mouth every 7 (seven) days.    . Amino Acids-Protein Hydrolys (FEEDING SUPPLEMENT,  PRO-STAT SUGAR FREE 64,) LIQD Take 30 mLs by mouth 2 (two) times daily. (Patient not taking: Reported on 12/22/2017) 1800 mL 0  . feeding supplement, ENSURE ENLIVE, (ENSURE ENLIVE) LIQD Take 237 mLs by mouth daily at 2 PM. (Patient not taking: Reported on 12/22/2017) 5000 mL 12   No facility-administered medications prior to visit.     No Known Allergies  ROS Review of Systems  Constitutional: Positive for fatigue. Negative for chills and fever.  HENT: Negative for congestion, sore throat  and trouble swallowing.   Respiratory: Positive for cough and shortness of breath. Negative for wheezing.   Gastrointestinal: Positive for abdominal pain (occasional left lower quadrant pain). Negative for constipation, diarrhea and nausea.  Endocrine: Positive for polyuria. Negative for polydipsia and polyphagia.  Genitourinary: Positive for frequency. Negative for dysuria.  Musculoskeletal: Positive for gait problem.  Skin: Positive for rash (psoriasis).  Neurological: Positive for dizziness and numbness (phantom limb pain).  Hematological: Negative for adenopathy. Does not bruise/bleed easily.  Psychiatric/Behavioral: Positive for confusion (mild). The patient is not nervous/anxious.       Objective:    Physical Exam  Constitutional: He is oriented to person, place, and time. He appears well-developed and well-nourished.  Overweight appearing elderly male in NAD sitting in a motorized wheelchair  Neck: Normal range of motion. Neck supple. No JVD present.  Cardiovascular: Regular rhythm. Tachycardia present.  Pulmonary/Chest: Effort normal. No respiratory distress.  Patient with some coarse breath sounds which clear with cough  Abdominal: Soft. He exhibits distension. There is no abdominal tenderness. There is no rebound and no guarding.  Musculoskeletal:     Comments: Patient is s/p bilateral AKA's  Neurological: He is alert and oriented to person, place, and time.  Skin: Skin is warm and dry.    Patient with dry flaky skin on the scalp with hypertrophic skin and scaly plaque on the arms  Psychiatric: He has a normal mood and affect. His behavior is normal. Judgment and thought content normal.  Nursing note and vitals reviewed.   BP (!) 147/99 (BP Location: Left Arm, Patient Position: Sitting, Cuff Size: Normal)   Pulse (!) 133   Temp 98.2 F (36.8 C) (Oral)  Wt Readings from Last 3 Encounters:  12/07/17 240 lb (108.9 kg)  11/19/16 226 lb 1.6 oz (102.6 kg)  05/23/15 232 lb (105.2 kg)     Health Maintenance Due  Topic Date Due  . OPHTHALMOLOGY EXAM  09/30/1938  . URINE MICROALBUMIN  09/30/1938  . PNA vac Low Risk Adult (2 of 2 - PCV13) 08/12/2013  . HEMOGLOBIN A1C  04/06/2018      Lab Results  Component Value Date   TSH 2.111 10/18/2014   Lab Results  Component Value Date   WBC 4.3 03/15/2018   HGB 13.4 03/15/2018   HCT 41.8 03/15/2018   MCV 95.9 03/15/2018   PLT 254 03/15/2018   Lab Results  Component Value Date   NA 140 03/15/2018   K 3.7 03/15/2018   CO2 27 03/15/2018   GLUCOSE 101 (H) 03/15/2018   BUN 24 (H) 03/15/2018   CREATININE 1.18 03/15/2018   BILITOT 0.7 12/22/2017   ALKPHOS 73 12/22/2017   AST 23 12/22/2017   ALT 10 12/22/2017   PROT 7.5 12/22/2017   ALBUMIN 3.8 12/22/2017   CALCIUM 9.3 03/15/2018   ANIONGAP 7 03/15/2018   Lab Results  Component Value Date   CHOL 222 (H) 05/10/2008   Lab Results  Component Value Date   HDL 51 05/10/2008   Lab Results  Component Value Date   LDLCALC 136 (H) 05/10/2008   Lab Results  Component Value Date   TRIG 176 (H) 05/10/2008   Lab Results  Component Value Date   CHOLHDL 4.4 Ratio 05/10/2008   Lab Results  Component Value Date   HGBA1C 6.7 (H) 10/05/2017      Assessment & Plan:  2. Type 2 diabetes mellitus with other circulatory complication, without long-term current use of insulin ([place]) Patient with normal nonfasting blood  sugar at today's visit of 96.  Since patient was  expected to be in the office for an extended duration, patient was allowed to have orange juice and graham crackers.  Patient's diabetes is currently diet controlled. - Glucose (CBG)  1.  Tachycardia EKG is unable to be done in the office.  Patient with heart rate which is remaining in the 130s to 140s.  On auscultation, patient does appear to be in normal rhythm.  Discussed with the patient and then with his daughter when she arrived that patient will be transported to the emergency department for further evaluation.  Patient is currently afebrile therefore sepsis is not suspected.  Patient has had recent cough which is nonproductive but with normal oxygen saturation at today's visit.  Patient does state that he has had some urinary frequency therefore patient may have some dehydration and/or urinary tract infection.  Patient will schedule follow-up appointment after ED evaluation.  3. Chronic diastolic CHF (congestive heart failure) ([place]) Patient with heart failure.  Patient appears to be overweight and has not been seen in some time therefore I am not sure if he is having any element of CHF exacerbation.  4. PAD (peripheral artery disease) ([place]) Patient with history of peripheral arterial disease which caused patient to develop a nonhealing left foot ulcer in the past which ultimately required amputation as patient also had severe bilateral peripheral arterial disease and chronic pain.  5. Chronic renal insufficiency, stage 3 (moderate) ([facility]) Patient with history of chronic renal insufficiency and stage III chronic kidney disease.  Patient's creatinine is now normal but this does not accurately reflect renal function as patient is status post bilateral AKA.  6. Neuropathic pain Patient reports improvement in neuropathic pain status post recent increase in gabapentin when he was seen in the emergency department in early March  7. Chronic gout of multiple sites, unspecified cause Patient's last  uric acid level was 9.8.  Patient reports no current or recent gout flareups.  Patient is currently on allopurinol.  8. S/P AKA (above knee amputation) bilateral ([facility]) Patient status post bilateral above-the-knee amputation secondary to peripheral arterial disease and diabetic ulcer.  Patient now depends on a motorized wheelchair for ambulation  8. Psoriasis Patient with chronic psoriasis.  Patient has been using over-the-counter Eucerin but this has been minimally minimally effective.  9. Encounter for long-term (current) use of medications Patient was seen for follow-up of chronic medical issues.  Patient was to have blood work today however patient presented with heart rate that was sustained in the 130s to 140s therefore patient will be sent to the emergency department for further evaluation and treatment.     Follow-up: schedule follow-up after ED evaluation staff   [name], MD

## 2018-07-16 ENCOUNTER — Inpatient Hospital Stay (HOSPITAL_COMMUNITY): Payer: Medicare Other

## 2018-07-16 DIAGNOSIS — N183 Chronic kidney disease, stage 3 (moderate): Secondary | ICD-10-CM

## 2018-07-16 DIAGNOSIS — I361 Nonrheumatic tricuspid (valve) insufficiency: Secondary | ICD-10-CM

## 2018-07-16 DIAGNOSIS — I509 Heart failure, unspecified: Secondary | ICD-10-CM

## 2018-07-16 DIAGNOSIS — I4892 Unspecified atrial flutter: Secondary | ICD-10-CM

## 2018-07-16 DIAGNOSIS — M1A09X0 Idiopathic chronic gout, multiple sites, without tophus (tophi): Secondary | ICD-10-CM

## 2018-07-16 DIAGNOSIS — I340 Nonrheumatic mitral (valve) insufficiency: Secondary | ICD-10-CM

## 2018-07-16 DIAGNOSIS — I4891 Unspecified atrial fibrillation: Secondary | ICD-10-CM

## 2018-07-16 LAB — BASIC METABOLIC PANEL
Anion gap: 8 (ref 5–15)
BUN: 15 mg/dL (ref 8–23)
CALCIUM: 8.7 mg/dL — AB (ref 8.9–10.3)
CO2: 26 mmol/L (ref 22–32)
Chloride: 104 mmol/L (ref 98–111)
Creatinine, Ser: 1.52 mg/dL — ABNORMAL HIGH (ref 0.61–1.24)
GFR calc Af Amer: 46 mL/min — ABNORMAL LOW (ref >60)
GFR calc non Af Amer: 40 mL/min — ABNORMAL LOW (ref >60)
Glucose, Bld: 142 mg/dL — ABNORMAL HIGH (ref 70–99)
Potassium: 3.7 mmol/L (ref 3.5–5.1)
Sodium: 138 mmol/L (ref 135–145)

## 2018-07-16 LAB — CBC
HCT: 38 % — ABNORMAL LOW (ref 39.0–52.0)
HEMOGLOBIN: 12.1 g/dL — AB (ref 13.0–17.0)
MCH: 30 pg (ref 26.0–34.0)
MCHC: 31.8 g/dL (ref 30.0–36.0)
MCV: 94.3 fL (ref 80.0–100.0)
Platelets: 212 10*3/uL (ref 150–400)
RBC: 4.03 MIL/uL — ABNORMAL LOW (ref 4.22–5.81)
RDW: 15.3 % (ref 11.5–15.5)
WBC: 4.6 10*3/uL (ref 4.0–10.5)
nRBC: 0 % (ref 0.0–0.2)

## 2018-07-16 LAB — HEPARIN LEVEL (UNFRACTIONATED)
Heparin Unfractionated: 0.66 IU/mL (ref 0.30–0.70)
Heparin Unfractionated: 0.77 IU/mL — ABNORMAL HIGH (ref 0.30–0.70)
Heparin Unfractionated: 0.28 IU/mL — ABNORMAL LOW (ref 0.30–0.70)

## 2018-07-16 LAB — ECHOCARDIOGRAM COMPLETE
Height: 48 in
Weight: 2927.71 oz

## 2018-07-16 LAB — TROPONIN I: Troponin I: 0.1 ng/mL (ref <0.03)

## 2018-07-16 MED ORDER — FUROSEMIDE 10 MG/ML IJ SOLN
40.0000 mg | Freq: Two times a day (BID) | INTRAMUSCULAR | Status: DC
  Administered 2018-07-16 – 2018-07-17 (×3): 40 mg via INTRAVENOUS
  Filled 2018-07-16 (×3): qty 4

## 2018-07-16 MED ORDER — SENNOSIDES-DOCUSATE SODIUM 8.6-50 MG PO TABS
2.0000 | ORAL_TABLET | Freq: Two times a day (BID) | ORAL | Status: AC
  Administered 2018-07-16: 2 via ORAL
  Filled 2018-07-16: qty 2

## 2018-07-16 MED ORDER — DILTIAZEM HCL 60 MG PO TABS
60.0000 mg | ORAL_TABLET | Freq: Three times a day (TID) | ORAL | Status: DC
  Administered 2018-07-16 – 2018-07-17 (×4): 60 mg via ORAL
  Filled 2018-07-16 (×4): qty 1

## 2018-07-16 NOTE — Progress Notes (Signed)
Progress Note  Patient Name: [name] [name] Date of Encounter: 07/16/2018  Primary Cardiologist: Skeet Latch, MD   Subjective   Feeling better.  Breathing is improving.  Denies chest pain, palpitations or shortness of breath.    Inpatient Medications    Scheduled Meds: . allopurinol  300 mg Oral Daily  . digoxin  0.25 mg Intravenous Once  . feeding supplement (PRO-STAT SUGAR FREE 64)  30 mL Oral BID  . furosemide  40 mg Intravenous BID  . gabapentin  200 mg Oral BID   And  . gabapentin  300 mg Oral QHS  . hydrocerin  1 application Topical BID   Continuous Infusions: . diltiazem (CARDIZEM) infusion 5 mg/hr (07/16/18 0720)  . heparin 1,300 Units/hr (07/15/18 1746)   PRN Meds: acetaminophen **OR** acetaminophen, albuterol, triamcinolone ointment   Vital Signs    Vitals:   07/15/18 1830 07/15/18 2008 07/16/18 0040 07/16/18 0446  BP: 115/77 113/86 113/87 106/79  Pulse: 74 88 86 84  Resp: 20 18 20 18   Temp:  98 F (36.7 C) (!) 97.5 F (36.4 C) 98 F (36.7 C)  TempSrc:  Oral Oral Oral  SpO2: 97% 100% 100% 98%  Weight:  82.8 kg  83 kg  Height:  4' (1.219 m)      Intake/Output Summary (Last 24 hours) at 07/16/2018 0857 Last data filed at 07/16/2018 0200 Gross per 24 hour  Intake 682.26 ml  Output 800 ml  Net -117.74 ml   Last 3 Weights 07/16/2018 07/15/2018 07/15/2018  Weight (lbs) 182 lb 15.7 oz 182 lb 8.7 oz 235 lb  Weight (kg) 83 kg 82.8 kg 106.595 kg      Telemetry    Atrial flutter.  Rate <100 bpm.  - Personally Reviewed  ECG    Atrial flutter with 2-3:1 conduction.  Ventricular rate 121 bpm.  - Personally Reviewed  Physical Exam   VS:  BP 106/79   Pulse 84   Temp 98 F (36.7 C) (Oral)   Resp 18   Ht 4' (1.219 m)   Wt 83 kg   SpO2 98%   BMI 55.84 kg/m  , BMI Body mass index is 55.84 kg/m. GENERAL:  Well appearing elderly man.  No acute distress HEENT: Pupils equal round and reactive, fundi not visualized, oral mucosa unremarkable NECK:  No  jugular venous distention, waveform within normal limits, carotid upstroke brisk and symmetric, no bruits LUNGS:  Clear to auscultation bilaterally HEART:  Irregularly irregular.  PMI not displaced or sustained,S1 and S2 within normal limits, no S3, no S4, no clicks, no rubs, no murmurs ABD:  Flat, positive bowel sounds normal in frequency in pitch, no bruits, no rebound, no guarding, no midline pulsatile mass, no hepatomegaly, no splenomegaly EXT:  2 plus pulses throughout, no edema, no cyanosis no clubbing.  Bilateral AKAs. SKIN:  No rashes no nodules.  Diffuse psoriatic plaques. NEURO:  Cranial nerves II through XII grossly intact, motor grossly intact throughout [facility]:  Cognitively intact, oriented to person place and time  Labs    Chemistry Recent Labs  Lab 07/15/18 1533  NA 139  K 3.8  CL 103  CO2 27  GLUCOSE 173*  BUN 15  CREATININE 1.62*  CALCIUM 8.6*  PROT 7.0  ALBUMIN 2.8*  AST 24  ALT 9  ALKPHOS 48  BILITOT 1.5*  GFRNONAA 37*  GFRAA 43*  ANIONGAP 9     Hematology Recent Labs  Lab 07/15/18 1533 07/16/18 0301  WBC 4.3 4.6  RBC 4.49 4.03*  HGB 13.5 12.1*  HCT 42.9 38.0*  MCV 95.5 94.3  MCH 30.1 30.0  MCHC 31.5 31.8  RDW 15.4 15.3  PLT 255 212    Cardiac Enzymes Recent Labs  Lab 07/15/18 1623 07/15/18 1924 07/16/18 0301  TROPONINI 0.09* 0.10* 0.10*   No results for input(s): TROPIPOC in the last 168 hours.   BNP Recent Labs  Lab 07/15/18 1533  BNP 1,151.4*     DDimer No results for input(s): DDIMER in the last 168 hours.   Radiology    Dg Chest 2 View  Result Date: 07/15/2018 CLINICAL DATA:  Shortness of breath EXAM: CHEST - 2 VIEW COMPARISON:  10/04/2017 FINDINGS: Small bilateral pleural effusions. Cardiomegaly with vascular congestion and mild pulmonary edema. Atelectasis or pneumonia at the left base. IMPRESSION: 1. Cardiomegaly with vascular congestion, interstitial pulmonary edema and small pleural effusions. 2. Left basilar  atelectasis or pneumonia Electronically Signed   By: Donavan Foil M.D.   On: 07/15/2018 16:14    Cardiac Studies   Echo 07/2012: Study Conclusions  - Left ventricle: The cavity size was normal. Wall thickness was increased in a pattern of mild LVH. There was mild focal basal hypertrophy of the septum. Systolic function was normal. The estimated ejection fraction was in the range of 55% to 60%. Wall motion was normal; there were no regional wall motion abnormalities. Features are consistent with a pseudonormal left ventricular filling pattern, with concomitant abnormal relaxation and increased filling pressure (grade 2 diastolic dysfunction). - Mitral valve: Mild regurgitation. - Left atrium: The atrium was mildly dilated.  Patient Profile     85 y.o. male with PAD status post bilateral AKA, diabetes, hypertension, hyperlipidemia, and CKD 3 admitted with new onset atrial flutter with rapid ventricular response and acute heart failure of, type unknown.  Assessment & Plan    # Atrial flutter: Mr. Sabedra presented with typical atrial flutter with rapid ventricular response.  His rates are now well-controlled on diltiazem.  We will convert to diltiazem 60 mg p.o. every 8 hours.  We will stop his diltiazem infusion 1 hour after his first oral dose.  He is currently on heparin it has not been on any antiplatelets or anticoagulants at home.  We will start Eliquis once his renal function is more clear.  Creatinine yesterday was 1.6, which is up from his most recent baseline.  We are currently awaiting this morning's level.  Echo is pending.    # Acute heart failure, type unknown: He remains volume overloaded. He is only recorded at -117 since admission.  However there is over a L in his foley bag.  Continue lasix 40mg  IV bid. Echo pending.  Continue diltiazem for now.  Will need to switch if LVEF is reduced.  # PAD:  He is s/p AKA.         For questions or updates, please  contact [place] Please consult [email or web address] for contact info under        Signed, Skeet Latch, MD  07/16/2018, 8:57 AM

## 2018-07-16 NOTE — Progress Notes (Signed)
ANTICOAGULATION CONSULT NOTE - Follow-up Consult  Pharmacy Consult for heparin Indication: atrial fibrillation  No Known Allergies  Patient Measurements: Height: 4' (121.9 cm) Weight: 182 lb 8.7 oz (82.8 kg) IBW/kg (Calculated) : 22.4 Heparin Dosing Weight: 95.8 kg (based on height prior to AKA in 09/2017)  Vital Signs: Temp: 97.5 F (36.4 C) (03/20 0040) Temp Source: Oral (03/20 0040) BP: 113/87 (03/20 0040) Pulse Rate: 86 (03/20 0040)  Labs: Recent Labs    07/15/18 1533 07/15/18 1623 07/15/18 1924 07/16/18 0301  HGB 13.5  --   --  12.1*  HCT 42.9  --   --  38.0*  PLT 255  --   --  212  HEPARINUNFRC  --   --   --  0.66  CREATININE 1.62*  --   --   --   TROPONINI  --  0.09* 0.10*  --     Estimated Creatinine Clearance: 20.4 mL/min (A) (by C-G formula based on SCr of 1.62 mg/dL (H)).   Assessment: 37 yoM admitted 3/19 with tachycardia. Patient with new onset afib, pharmacy has been consulted for heparin dosing. Patient not on anticoagulation PTA. Of note, patient has bilateral AKA (09/2017). Patient's baseline CBC stable. Scr slightly elevated at 1.62.  Heparin level therapeutic (0.66) on gtt at 1300 units/hr. No bleeding noted.  Goal of Therapy:  Heparin level 0.3-0.7 units/ml Monitor platelets by anticoagulation protocol: Yes   Plan:  Continue heparin infusion at 1300 units/hr Daily heparin level and CBC  Thank you for allowing pharmacy to be a part of this patient's care.  Sherlon Handing, PharmD, BCPS Clinical pharmacist  **Pharmacist phone directory can now be found on [email or web address] ([email or web address]).  Listed under [place]. 07/16/2018,3:27 AM

## 2018-07-16 NOTE — Progress Notes (Signed)
  Echocardiogram 2D Echocardiogram has been performed.  [name] 07/16/2018, 11:34 AM

## 2018-07-16 NOTE — Progress Notes (Signed)
[facility] PROGRESS NOTE  Kongmeng Santoro GQQ:[id number] DOB: 1927-01-07 DOA: 07/15/2018  PCP: [name], MD  Brief History/Interval Summary: 85 y.o. male, with past medical history significant for chronic diastolic CHF, CKD stage III, hypertension, peripheral vascular disease status post bilateral AKA, diabetes mellitus, sent from [facility]  from [facility] clinic.  Patient was at the clinic for a routine visit.  He was noted to be tachycardic.  He also mentioned shortness of breath.  EKG raise concern for irregular heart rhythm, atrial fibrillation versus atrial flutter.  Chest x-ray raised concern for volume overload.  He was hospitalized for further management.  Reason for Visit: Atrial fibrillation with RVR  Consultants: Cardiology  Procedures:   Transthoracic echocardiogram is pending  Antibiotics: None  Subjective/Interval History: Patient states that he is feeling better this morning in terms of his shortness of breath.  Denies any chest pain.  No nausea vomiting.  ROS: Denies any headaches  Objective:  Vital Signs  Vitals:   07/15/18 2008 07/16/18 0040 07/16/18 0446 07/16/18 0923  BP: 113/86 113/87 106/79 119/88  Pulse: 88 86 84 87  Resp: 18 20 18 20   Temp: 98 F (36.7 C) (!) 97.5 F (36.4 C) 98 F (36.7 C) 97.7 F (36.5 C)  TempSrc: Oral Oral Oral Oral  SpO2: 100% 100% 98% 100%  Weight: 82.8 kg  83 kg   Height: 4' (1.219 m)       Intake/Output Summary (Last 24 hours) at 07/16/2018 0950 Last data filed at 07/16/2018 6712 Gross per 24 hour  Intake 682.26 ml  Output 1575 ml  Net -892.74 ml   Filed Weights   07/15/18 1612 07/15/18 2008 07/16/18 0446  Weight: 106.6 kg 82.8 kg 83 kg    General appearance: alert, cooperative, appears stated age and no distress Head: Normocephalic, without obvious abnormality, atraumatic Resp: Normal effort at rest.  Crackles noted bilateral bases.  No wheezing or rhonchi. Cardio: S1-S2 is irregularly irregular.  No S3-S4.   No rubs murmurs or bruit GI: soft, non-tender; bowel sounds normal; no masses,  no organomegaly Extremities: He has bilateral amputee Pulses: 2+ and symmetric Skin: Skin color, texture, turgor normal. No rashes or lesions Neurologic: No focal neurological deficits  Lab Results:  Data Reviewed: I have personally reviewed following labs and imaging studies  CBC: Recent Labs  Lab 07/15/18 1533 07/16/18 0301  WBC 4.3 4.6  HGB 13.5 12.1*  HCT 42.9 38.0*  MCV 95.5 94.3  PLT 255 458    Basic Metabolic Panel: Recent Labs  Lab 07/15/18 1533  NA 139  K 3.8  CL 103  CO2 27  GLUCOSE 173*  BUN 15  CREATININE 1.62*  CALCIUM 8.6*  MG 2.1    GFR: Estimated Creatinine Clearance: 20.4 mL/min (A) (by C-G formula based on SCr of 1.62 mg/dL (H)).  Liver Function Tests: Recent Labs  Lab 07/15/18 1533  AST 24  ALT 9  ALKPHOS 48  BILITOT 1.5*  PROT 7.0  ALBUMIN 2.8*    Cardiac Enzymes: Recent Labs  Lab 07/15/18 1623 07/15/18 1924 07/16/18 0301  TROPONINI 0.09* 0.10* 0.10*    Thyroid Function Tests: Recent Labs    07/15/18 1623  TSH 3.091       Radiology Studies: Dg Chest 2 View  Result Date: 07/15/2018 CLINICAL DATA:  Shortness of breath EXAM: CHEST - 2 VIEW COMPARISON:  10/04/2017 FINDINGS: Small bilateral pleural effusions. Cardiomegaly with vascular congestion and mild pulmonary edema. Atelectasis or pneumonia at the left base. IMPRESSION: 1.  Cardiomegaly with vascular congestion, interstitial pulmonary edema and small pleural effusions. 2. Left basilar atelectasis or pneumonia Electronically Signed   By: Donavan Foil M.D.   On: 07/15/2018 16:14     Medications:  Scheduled: . allopurinol  300 mg Oral Daily  . digoxin  0.25 mg Intravenous Once  . diltiazem  60 mg Oral Q8H  . feeding supplement (PRO-STAT SUGAR FREE 64)  30 mL Oral BID  . furosemide  40 mg Intravenous BID  . gabapentin  200 mg Oral BID   And  . gabapentin  300 mg Oral QHS  .  hydrocerin  1 application Topical BID   Continuous: . heparin 1,300 Units/hr (07/15/18 1746)   HYQ:[id number] **OR** acetaminophen, albuterol, triamcinolone ointment    Assessment/Plan:   Atrial flutter with RVR This is a new diagnosis for the patient.  Patient was placed on Cardizem infusion.  Cardiology was consulted.  TSH is normal.  Echocardiogram is pending.  Hopefully he can be transitioned to oral rate control medications today.  Anticoagulation per cardiology.  He is on heparin at this time.  Acute CHF, unknown type His x-ray did show evidence for volume overload.  He was dyspneic as well.  He had symptoms of orthopnea and PND.  Patient was given a dose of Lasix with improvement in his symptoms.  Unclear how much he diuresed.  Echocardiogram is pending.  Will determine further treatments based on results of echocardiogram.  Mildly elevated troponin Most likely due to atrial flutter.  Follow-up on echocardiogram.  Patient denies any chest pain currently.  Acute on chronic kidney disease stage III Creatinine noted to be 1.6 at admission.  Baseline creatinine about 1.4-1.5.  Patient given a dose of Lasix yesterday.  Will order renal function panel for this morning.  Monitor urine output.  History of peripheral vascular disease He is status post bilateral AKA's  Diabetes mellitus type 2 Controlled with diet  Essential hypertension Apparently has a history of this but not noted to be on any medications for the same.  Monitor blood pressures.  Noted to be borderline at times.  History of gout Continue allopurinol  History of psoriasis Continue with the topical treatments.  History of neuropathy Continue with gabapentin  DVT Prophylaxis: Currently on intravenous heparin    Code Status: Full code Family Communication: Discussed with the patient.  No family at bedside Disposition Plan: Management as outlined above.  Hopefully he will be able to return home when  medically ready for discharge.    LOS: 1 day   Lakeisha Waldrop [employer] on [email or web address]  07/16/2018, 9:50 AM

## 2018-07-16 NOTE — Progress Notes (Signed)
ANTICOAGULATION CONSULT NOTE - Follow Up Consult  Pharmacy Consult for Heparin Indication: atrial fibrillation  No Known Allergies  Patient Measurements: Height: 4' (121.9 cm) Weight: 182 lb 15.7 oz (83 kg) IBW/kg (Calculated) : 22.4 Heparin Dosing Weight:    Vital Signs: Temp: 97.7 F (36.5 C) (03/20 0923) Temp Source: Oral (03/20 0923) BP: 119/88 (03/20 0923) Pulse Rate: 87 (03/20 0923)  Labs: Recent Labs    07/15/18 1533 07/15/18 1623 07/15/18 1924 07/16/18 0301 07/16/18 1023  HGB 13.5  --   --  12.1*  --   HCT 42.9  --   --  38.0*  --   PLT 255  --   --  212  --   HEPARINUNFRC  --   --   --  0.66 0.77*  CREATININE 1.62*  --   --   --  1.52*  TROPONINI  --  0.09* 0.10* 0.10*  --     Estimated Creatinine Clearance: 21.7 mL/min (A) (by C-G formula based on SCr of 1.52 mg/dL (H)).  Assessment: Anticoag: New afib.  Hgb 13.5>12.1. Plts 255>212 watch. HL 0.66>0.77 up.  Goal of Therapy:  Heparin level 0.3-0.7 units/ml Monitor platelets by anticoagulation protocol: Yes   Plan:  Decrease IV heparin to 1150 units/hr Recheck heparin level in 6 hrs. Daily HL and CBC  Ande Therrell S. Alford Highland, PharmD, [place] Clinical Staff Pharmacist Eilene Ghazi Stillinger 07/16/2018,11:31 AM

## 2018-07-16 NOTE — Progress Notes (Signed)
ANTICOAGULATION CONSULT NOTE - Follow Up Consult  Pharmacy Consult for Heparin Indication: atrial fibrillation  No Known Allergies  Patient Measurements: Height: 4' (121.9 cm) Weight: 182 lb 15.7 oz (83 kg) IBW/kg (Calculated) : 22.4 Heparin Dosing Weight:    Vital Signs: Temp: 98.1 F (36.7 C) (03/20 2114) Temp Source: Oral (03/20 2114) BP: 111/57 (03/20 2114) Pulse Rate: 71 (03/20 2114)  Labs: Recent Labs    07/15/18 1533 07/15/18 1623 07/15/18 1924 07/16/18 0301 07/16/18 1023 07/16/18 2108  HGB 13.5  --   --  12.1*  --   --   HCT 42.9  --   --  38.0*  --   --   PLT 255  --   --  212  --   --   HEPARINUNFRC  --   --   --  0.66 0.77* 0.28*  CREATININE 1.62*  --   --   --  1.52*  --   TROPONINI  --  0.09* 0.10* 0.10*  --   --     Estimated Creatinine Clearance: 21.7 mL/min (A) (by C-G formula based on SCr of 1.52 mg/dL (H)).  Assessment: Anticoag: New afib.  Hgb 13.5>12.1. Plts 255>212 watch. Heparin level at 0.28 this PM  Goal of Therapy:  Heparin level 0.3-0.7 units/ml Monitor platelets by anticoagulation protocol: Yes   Plan:  Increase heparin to 1200 units / hr Daily HL and CBC  Thank you Anette Guarneri, PharmD [phone] 07/16/2018,9:40 PM

## 2018-07-17 DIAGNOSIS — I5021 Acute systolic (congestive) heart failure: Secondary | ICD-10-CM

## 2018-07-17 DIAGNOSIS — Z89611 Acquired absence of right leg above knee: Secondary | ICD-10-CM

## 2018-07-17 DIAGNOSIS — Z89612 Acquired absence of left leg above knee: Secondary | ICD-10-CM

## 2018-07-17 DIAGNOSIS — I10 Essential (primary) hypertension: Secondary | ICD-10-CM

## 2018-07-17 LAB — BASIC METABOLIC PANEL
Anion gap: 12 (ref 5–15)
BUN: 17 mg/dL (ref 8–23)
CO2: 28 mmol/L (ref 22–32)
Calcium: 8.6 mg/dL — ABNORMAL LOW (ref 8.9–10.3)
Chloride: 98 mmol/L (ref 98–111)
Creatinine, Ser: 1.49 mg/dL — ABNORMAL HIGH (ref 0.61–1.24)
GFR calc Af Amer: 48 mL/min — ABNORMAL LOW (ref >60)
GFR, EST NON AFRICAN AMERICAN: 41 mL/min — AB (ref >60)
Glucose, Bld: 100 mg/dL — ABNORMAL HIGH (ref 70–99)
Potassium: 3.5 mmol/L (ref 3.5–5.1)
Sodium: 138 mmol/L (ref 135–145)

## 2018-07-17 LAB — CBC
HCT: 38.8 % — ABNORMAL LOW (ref 39.0–52.0)
Hemoglobin: 12.7 g/dL — ABNORMAL LOW (ref 13.0–17.0)
MCH: 30.6 pg (ref 26.0–34.0)
MCHC: 32.7 g/dL (ref 30.0–36.0)
MCV: 93.5 fL (ref 80.0–100.0)
NRBC: 0 % (ref 0.0–0.2)
Platelets: 225 10*3/uL (ref 150–400)
RBC: 4.15 MIL/uL — ABNORMAL LOW (ref 4.22–5.81)
RDW: 15.2 % (ref 11.5–15.5)
WBC: 3.9 10*3/uL — ABNORMAL LOW (ref 4.0–10.5)

## 2018-07-17 LAB — HEPARIN LEVEL (UNFRACTIONATED): HEPARIN UNFRACTIONATED: 0.14 [IU]/mL — AB (ref 0.30–0.70)

## 2018-07-17 MED ORDER — POTASSIUM CHLORIDE CRYS ER 20 MEQ PO TBCR
20.0000 meq | EXTENDED_RELEASE_TABLET | Freq: Every day | ORAL | Status: DC
  Administered 2018-07-17: 20 meq via ORAL
  Filled 2018-07-17: qty 1

## 2018-07-17 MED ORDER — LOSARTAN POTASSIUM 50 MG PO TABS
50.0000 mg | ORAL_TABLET | Freq: Every day | ORAL | Status: DC
  Administered 2018-07-17: 50 mg via ORAL
  Filled 2018-07-17: qty 1

## 2018-07-17 MED ORDER — FUROSEMIDE 40 MG PO TABS: 40.0000 mg | ORAL_TABLET | Freq: Every day | ORAL | 3 refills | Status: DC

## 2018-07-17 MED ORDER — BISACODYL 10 MG RE SUPP
10.0000 mg | Freq: Every day | RECTAL | Status: DC | PRN
  Administered 2018-07-17: 10 mg via RECTAL
  Filled 2018-07-17: qty 1

## 2018-07-17 MED ORDER — LOSARTAN POTASSIUM 50 MG PO TABS: 50.0000 mg | ORAL_TABLET | Freq: Every day | ORAL | 3 refills | Status: DC

## 2018-07-17 MED ORDER — METOPROLOL SUCCINATE ER 100 MG PO TB24: 100.0000 mg | ORAL_TABLET | Freq: Every day | ORAL | 3 refills | Status: DC

## 2018-07-17 MED ORDER — FUROSEMIDE 40 MG PO TABS: 40.0000 mg | ORAL_TABLET | Freq: Every day | ORAL | Status: DC

## 2018-07-17 MED ORDER — APIXABAN 5 MG PO TABS: 5.0000 mg | ORAL_TABLET | Freq: Two times a day (BID) | ORAL | 2 refills | Status: DC

## 2018-07-17 MED ORDER — APIXABAN 5 MG PO TABS
5.0000 mg | ORAL_TABLET | Freq: Two times a day (BID) | ORAL | Status: DC
  Administered 2018-07-17: 5 mg via ORAL
  Filled 2018-07-17: qty 1

## 2018-07-17 MED ORDER — POLYETHYLENE GLYCOL 3350 17 G PO PACK: 17.0000 g | PACK | Freq: Every day | ORAL | 0 refills | Status: DC | PRN

## 2018-07-17 MED ORDER — METOPROLOL SUCCINATE ER 100 MG PO TB24
100.0000 mg | ORAL_TABLET | Freq: Every day | ORAL | Status: DC
  Administered 2018-07-17: 100 mg via ORAL
  Filled 2018-07-17: qty 1

## 2018-07-17 NOTE — Progress Notes (Signed)
Patient discharged: Home with family/daughter  Via: Wheelchair   Discharge paperwork given: to patient and family  Reviewed with teach back  IV and telemetry disconnected  Belongings given to patient

## 2018-07-17 NOTE — Progress Notes (Signed)
IV and teli d/c. AVS ready.  Patient is contacting daughter to come and pick him up.

## 2018-07-17 NOTE — Discharge Summary (Signed)
Physician Discharge Summary  [name] [name] WJX:[id number] DOB: 1927-04-17 DOA: 07/15/2018  PCP: [name], MD  Admit date: 07/15/2018 Discharge date: 07/17/2018  Time spent: 30* minutes  Recommendations for Outpatient Follow-up:  1. Follow-up cardiology as outpatient 2.   Discharge Diagnoses:  Active Problems:   HTN (hypertension)   Gout   Diabetes mellitus (HCC)   Acute renal failure (HCC)   Chronic renal insufficiency, stage 3 (moderate) (HCC)   Chronic diastolic CHF (congestive heart failure) (HCC)   S/P AKA (above knee amputation) bilateral (HCC)   Atrial fibrillation with RVR (HCC)   Acute on chronic heart failure (HCC)   Atrial fibrillation and flutter ([place])   Discharge Condition: Stable  Diet recommendation: Heart healthy diet  Filed Weights   07/16/18 0446 07/17/18 0024 07/17/18 0500  Weight: 83 kg 81.1 kg 81.1 kg    History of present illness:  85 year old male with a history of chronic diastolic CHF, CKD stage III, hypertension, peripheral vascular disease status post bilateral AKA, diabetes mellitus type 2 was sent from wellness clinic.  In the clinic he was found to be tachycardic.  Also complained of shortness of breath.  EKG showed irregular heart rhythm atrial fibrillation versus atrial flutter.  Chest x-ray raise concern for volume overload.  Patient admitted for further management  Hospital Course:   1. Atrial flutter with RVR-new onset, patient initially placed on Cardizem gtt., switch to Cardizem 60 mg every 8 hours.  TSH is normal, echocardiogram showed EF 15 to 20%, diffuse hypokinesis, moderately increased left ventricular wall thickness.  Reduced right ventricle systolic function. Patient started on Eliquis 5 mg p.o. twice daily.  Will start Toprol-XL 100 mg p.o. daily.  Patient is currently back in normal sinus rhythm.  2. Acute systolic CHF-echocardiogram showed EF 15 to 20%, patient presented with dyspnea.    Patient diuresed well with IV Lasix.  Will  start Lasix 40 mg p.o. daily.  Losartan 50 mg daily, Toprol-XL 100 mg p.o. daily.  3. Elevated troponin-patient had mildly elevated troponin, 0.10.  Likely from demand ischemia from atrial fibrillation.EF did show diffuse hypokinesis.    Cardiology will follow-up as outpatient to discuss further options including cardiac catheterization.  4. Diabetes mellitus type 2-diet controlled.  5. History of peripheral vascular disease-patient is status post bilateral AKA.  6. History of gout-continue allopurinol.  7. History of neuropathy-continue gabapentin.    Procedures:  echo  Consultations:  Cardiology   Discharge Exam: Vitals:   07/17/18 0902 07/17/18 1144  BP: 132/68 113/85  Pulse: 80 77  Resp: 18 18  Temp:  (!) 97.5 F (36.4 C)  SpO2: 95% 100%     Discharge Instructions   Discharge Instructions    Amb referral to AFIB Clinic   Complete by:  As directed    Diet - low sodium heart healthy   Complete by:  As directed    Increase activity slowly   Complete by:  As directed      Allergies as of 07/17/2018   No Known Allergies     Medication List    TAKE these medications   allopurinol 300 MG tablet Commonly known as:  ZYLOPRIM Take 1 tablet (300 mg total) by mouth daily.   apixaban 5 MG Tabs tablet Commonly known as:  ELIQUIS Take 1 tablet (5 mg total) by mouth 2 (two) times daily.   feeding supplement (ENSURE ENLIVE) Liqd Take 237 mLs by mouth daily at 2 PM.   feeding supplement (PRO-STAT SUGAR FREE 64)  Liqd Take 30 mLs by mouth 2 (two) times daily.   furosemide 40 MG tablet Commonly known as:  LASIX Take 1 tablet (40 mg total) by mouth daily. What changed:    medication strength  how much to take   gabapentin 100 MG capsule Commonly known as:  NEURONTIN Patient takes 2 Capsule in the Morning, 2 Capsule every evening and 3 Capsules at Bedtime. What changed:    how much to take  how to take this  when to take this  additional  instructions   hydrocerin Crea Apply 1 application topically 2 (two) times daily.   losartan 50 MG tablet Commonly known as:  COZAAR Take 1 tablet (50 mg total) by mouth daily.   metoprolol succinate 100 MG 24 hr tablet Commonly known as:  TOPROL-XL Take 1 tablet (100 mg total) by mouth daily. Take with or immediately following a meal.   multivitamin with minerals Tabs tablet Take 1 tablet by mouth daily.   polyethylene glycol packet Commonly known as:  MIRALAX / GLYCOLAX Take 17 g by mouth daily as needed for mild constipation.   protein supplement shake Liqd Commonly known as:  PREMIER PROTEIN Take 325 mLs (11 oz total) by mouth daily.   ramelteon 8 MG tablet Commonly known as:  ROZEREM Take 1 tablet (8 mg total) by mouth at bedtime.   sodium chloride 0.65 % Soln nasal spray Commonly known as:  OCEAN Place 1-2 sprays into both nostrils as needed for congestion.   traMADol 50 MG tablet Commonly known as:  ULTRAM Take 2 tablets (100 mg total) by mouth every 12 (twelve) hours as needed for moderate pain.   triamcinolone ointment 0.1 % Commonly known as:  KENALOG Apply 1 application topically 2 (two) times daily. To affected skin areas as needed      No Known Allergies    The results of significant diagnostics from this hospitalization (including imaging, microbiology, ancillary and laboratory) are listed below for reference.    Significant Diagnostic Studies: Dg Chest 2 View  Result Date: 07/15/2018 CLINICAL DATA:  Shortness of breath EXAM: CHEST - 2 VIEW COMPARISON:  10/04/2017 FINDINGS: Small bilateral pleural effusions. Cardiomegaly with vascular congestion and mild pulmonary edema. Atelectasis or pneumonia at the left base. IMPRESSION: 1. Cardiomegaly with vascular congestion, interstitial pulmonary edema and small pleural effusions. 2. Left basilar atelectasis or pneumonia Electronically Signed   By: Donavan Foil M.D.   On: 07/15/2018 16:14     Microbiology: No results found for this or any previous visit (from the past 240 hour(s)).   Labs: Basic Metabolic Panel: Recent Labs  Lab 07/15/18 1533 07/16/18 1023 07/17/18 0329  NA 139 138 138  K 3.8 3.7 3.5  CL 103 104 98  CO2 27 26 28   GLUCOSE 173* 142* 100*  BUN 15 15 17   CREATININE 1.62* 1.52* 1.49*  CALCIUM 8.6* 8.7* 8.6*  MG 2.1  --   --    Liver Function Tests: Recent Labs  Lab 07/15/18 1533  AST 24  ALT 9  ALKPHOS 48  BILITOT 1.5*  PROT 7.0  ALBUMIN 2.8*   No results for input(s): LIPASE, AMYLASE in the last 168 hours. No results for input(s): AMMONIA in the last 168 hours. CBC: Recent Labs  Lab 07/15/18 1533 07/16/18 0301 07/17/18 0329  WBC 4.3 4.6 3.9*  HGB 13.5 12.1* 12.7*  HCT 42.9 38.0* 38.8*  MCV 95.5 94.3 93.5  PLT 255 212 225   Cardiac Enzymes: Recent Labs  Lab  07/15/18 1623 07/15/18 1924 07/16/18 0301  TROPONINI 0.09* 0.10* 0.10*   BNP: BNP (last 3 results) Recent Labs    07/15/18 1533  BNP 1,151.4*        Signed:  Oswald Hillock MD.  [facility] 07/17/2018, 12:46 PM

## 2018-07-17 NOTE — Progress Notes (Signed)
CCMD called now stating that during the shift change patient had 2 beats of VT and than again 12. Patient asymptomatic, stated he did not feel anything, he was asleep. Paged MD [place] and notified him of changes.

## 2018-07-17 NOTE — Care Management (Signed)
Patient provided with Eliquis  30 day coupon. Also called patient's daughter and reviewed how to use Eliquis coupon with her.

## 2018-07-17 NOTE — Progress Notes (Signed)
ANTICOAGULATION CONSULT NOTE - Follow Up Consult  Pharmacy Consult for Heparin Indication: atrial fibrillation  No Known Allergies  Patient Measurements: Height: 4' (121.9 cm) Weight: 178 lb 12.7 oz (81.1 kg) IBW/kg (Calculated) : 22.4 Heparin Dosing Weight:  81 kg  Vital Signs: Temp: 98.1 F (36.7 C) (03/21 0024) Temp Source: Oral (03/21 0024) BP: 128/81 (03/21 0024) Pulse Rate: 74 (03/21 0024)  Labs: Recent Labs    07/15/18 1533 07/15/18 1623 07/15/18 1924  07/16/18 0301 07/16/18 1023 07/16/18 2108 07/17/18 0329  HGB 13.5  --   --   --  12.1*  --   --  12.7*  HCT 42.9  --   --   --  38.0*  --   --  38.8*  PLT 255  --   --   --  212  --   --  225  HEPARINUNFRC  --   --   --    < > 0.66 0.77* 0.28* 0.14*  CREATININE 1.62*  --   --   --   --  1.52*  --   --   TROPONINI  --  0.09* 0.10*  --  0.10*  --   --   --    < > = values in this interval not displayed.    Estimated Creatinine Clearance: 21.4 mL/min (A) (by C-G formula based on SCr of 1.52 mg/dL (H)).  Assessment: Anticoag: New afib.  Hgb 13.5>12.1. Plts 255>212 watch. Heparin level down to 0.14 on gtt at 1200 units/hr. No issues with line or bleeding reported per RN.  Goal of Therapy:  Heparin level 0.3-0.7 units/ml Monitor platelets by anticoagulation protocol: Yes   Plan:  Increase heparin to 1350 units / hr Will f/u 8 hr heparin level  Sherlon Handing, PharmD, BCPS Clinical pharmacist  **Pharmacist phone directory can now be found on [email or web address] ([email or web address]).  Listed under [place]. 07/17/2018,4:31 AM

## 2018-07-17 NOTE — Progress Notes (Signed)
Progress Note  Patient Name: [name] [name] Date of Encounter: 07/17/2018  Primary Cardiologist: Skeet Latch, MD   Subjective   Improved.  Converted to sinus rhythm on 07/17/2018 at 8:11 AM.  Inpatient Medications    Scheduled Meds: . allopurinol  300 mg Oral Daily  . diltiazem  60 mg Oral Q8H  . feeding supplement (PRO-STAT SUGAR FREE 64)  30 mL Oral BID  . furosemide  40 mg Intravenous BID  . gabapentin  200 mg Oral BID   And  . gabapentin  300 mg Oral QHS  . hydrocerin  1 application Topical BID  . potassium chloride  20 mEq Oral Daily   Continuous Infusions: . heparin 1,350 Units/hr (07/17/18 0901)   PRN Meds: acetaminophen **OR** acetaminophen, albuterol, bisacodyl, triamcinolone ointment   Vital Signs    Vitals:   07/17/18 0500 07/17/18 0501 07/17/18 0902 07/17/18 1144  BP:  125/88 132/68 113/85  Pulse:  74 80 77  Resp:  18 18 18   Temp:  (!) 97.5 F (36.4 C)  (!) 97.5 F (36.4 C)  TempSrc:  Oral  Oral  SpO2:  94% 95% 100%  Weight: 81.1 kg     Height:        Intake/Output Summary (Last 24 hours) at 07/17/2018 1222 Last data filed at 07/17/2018 0903 Gross per 24 hour  Intake 1593.65 ml  Output 3900 ml  Net -2306.35 ml   Last 3 Weights 07/17/2018 07/17/2018 07/16/2018  Weight (lbs) 178 lb 12.7 oz 178 lb 12.7 oz 182 lb 15.7 oz  Weight (kg) 81.1 kg 81.1 kg 83 kg      Telemetry    She [name] [name] flutter converted to sinus rhythm.  - Personally Reviewed  ECG    None new- Personally Reviewed  Physical Exam   VS:  BP 113/85 (BP Location: Right Arm)   Pulse 77   Temp (!) 97.5 F (36.4 C) (Oral)   Resp 18   Ht 4' (1.219 m)   Wt 81.1 kg   SpO2 100%   BMI 54.56 kg/m  , BMI Body mass index is 54.56 kg/m.  GEN: Well nourished, well developed, in no acute distress  HEENT: normal  Neck: no JVD, carotid bruits, or masses Cardiac: RRR; no murmurs, rubs, or gallops,no edema  Respiratory:  clear to auscultation bilaterally, normal work of breathing GI:  soft, nontender, nondistended, + BS MS: no deformity or atrophy  Skin: warm and dry Neuro:  Strength and sensation are intact Psych: euthymic mood, full affect   Labs    Chemistry Recent Labs  Lab 07/15/18 1533 07/16/18 1023 07/17/18 0329  NA 139 138 138  K 3.8 3.7 3.5  CL 103 104 98  CO2 27 26 28   GLUCOSE 173* 142* 100*  BUN 15 15 17   CREATININE 1.62* 1.52* 1.49*  CALCIUM 8.6* 8.7* 8.6*  PROT 7.0  --   --   ALBUMIN 2.8*  --   --   AST 24  --   --   ALT 9  --   --   ALKPHOS 48  --   --   BILITOT 1.5*  --   --   GFRNONAA 37* 40* 41*  GFRAA 43* 46* 48*  ANIONGAP 9 8 12      Hematology Recent Labs  Lab 07/15/18 1533 07/16/18 0301 07/17/18 0329  WBC 4.3 4.6 3.9*  RBC 4.49 4.03* 4.15*  HGB 13.5 12.1* 12.7*  HCT 42.9 38.0* 38.8*  MCV 95.5 94.3 93.5  MCH  30.1 30.0 30.6  MCHC 31.5 31.8 32.7  RDW 15.4 15.3 15.2  PLT 255 212 225    Cardiac Enzymes Recent Labs  Lab 07/15/18 1623 07/15/18 1924 07/16/18 0301  TROPONINI 0.09* 0.10* 0.10*   No results for input(s): TROPIPOC in the last 168 hours.   BNP Recent Labs  Lab 07/15/18 1533  BNP 1,151.4*     DDimer No results for input(s): DDIMER in the last 168 hours.   Radiology    Dg Chest 2 View  Result Date: 07/15/2018 CLINICAL DATA:  Shortness of breath EXAM: CHEST - 2 VIEW COMPARISON:  10/04/2017 FINDINGS: Small bilateral pleural effusions. Cardiomegaly with vascular congestion and mild pulmonary edema. Atelectasis or pneumonia at the left base. IMPRESSION: 1. Cardiomegaly with vascular congestion, interstitial pulmonary edema and small pleural effusions. 2. Left basilar atelectasis or pneumonia Electronically Signed   By: Donavan Foil M.D.   On: 07/15/2018 16:14    Cardiac Studies   Echo 07/16/18: Study Conclusions   1. The left ventricle has a visually estimated ejection fraction of approximately 15-20%. The cavity size was normal. There is moderately increased left ventricular wall thickness. There  is diffuse hypokinesis. Left ventricular diastolic Doppler  parameters are indeterminate in the setting of atrial fibrillation.  2. The right ventricle has severely reduced systolic function. The cavity was moderately enlarged. There is no increase in right ventricular wall thickness. Right ventricular systolic pressure normal with an estimated pressure of 32.9 mmHg.  3. Left atrial size was severely dilated.  4. The pericardial effusion is posterior to the left ventricle.  5. Trivial pericardial effusion is present.  6. Left pleural effusion is preseent.  7. The aortic valve is tricuspid. Mild calcification of the aortic valve. Mild to moderate aortic annular calcification noted.  8. The mitral valve is abnormal. Mild thickening of the mitral valve leaflet. There is mild mitral annular calcification present. Mitral valve regurgitation is mild to moderate by color flow Doppler.  9. The aortic root is normal in size and structure. 10. The inferior vena cava was dilated in size with >50% respiratory variability.  Patient Profile     85 y.o. male with PAD status post bilateral AKA, diabetes, hypertension, hyperlipidemia, and CKD 3 admitted with new onset atrial flutter with rapid ventricular response and acute heart failure of, type unknown.  Assessment & Plan    # Atrial flutter: Presented with typical flutter with rapid response associated with weakness and fatigue.  He has since converted to sinus rhythm on p.o. diltiazem.  Would start Toprol-XL 100 mg daily..  We [name] [name] also start him on 5 mg of Eliquis.  We [name] [name] arrange for cardiology follow-up.    # Acute heart failure, type unknown: Net out 3 L since admission.  Likely due to atrial flutter.  Due to his acute systolic heart failure, which could be due to a tachycardia related cardiomyopathy, would start him on 100 mg of Toprol-XL as well as 50 mg of losartan.  # PAD:   That is post AKA  [facility] [name] [name] sign off.   Medication  Recommendations:  eliquis 5 mg daily, Toprol-XL 100 mg, losartan 50 mg Other recommendations (labs, testing, etc):  none Follow up as an outpatient:  To be arranged in cardiology clinic      For questions or updates, please contact [name] [name] Please consult [email or web address] for contact info under        Signed, Santa Abdelrahman Meredith Leeds, MD  07/17/2018, 12:22 PM

## 2018-07-17 NOTE — Progress Notes (Signed)
Patient constipated, paged MD and asked for PRN.  Tymir Terral, RN

## 2018-07-17 NOTE — Progress Notes (Signed)
[facility]  PROGRESS NOTE  [name] [name] UYQ:[id number] DOB: [birth year] DOA: 07/15/2018 PCP: [name], MD   Brief HPI:   85 year old male with a history of chronic diastolic CHF, CKD stage III, hypertension, peripheral vascular disease status post bilateral AKA, diabetes mellitus type 2 was sent from wellness clinic.  In the clinic he was found to be tachycardic.  Also complained of shortness of breath.  EKG showed irregular heart rhythm atrial fibrillation versus atrial flutter.  Chest x-ray raise concern for volume overload.  Patient admitted for further management.    Subjective   This morning patient denies chest pain or shortness of breath.   Assessment/Plan:     1. Atrial flutter with RVR-new onset, patient initially placed on Cardizem gtt., switch to Cardizem 60 mg every 8 hours.  TSH is normal, echocardiogram showed EF 15 to 20%, diffuse hypokinesis, moderately increased left ventricular wall thickness.  Reduced right ventricle systolic function.  Continue anticoagulation with IV heparin.  2. Acute systolic CHF-echocardiogram showed EF 15 to 20%, patient presented with dyspnea.  Currently on IV Lasix.  Cardiology following.  3. Elevated troponin-patient had mildly elevated troponin, 0.10.  Likely from demand ischemia from atrial fibrillation.EF did show diffuse hypokinesis.  Will defer to cardiology for further management.  4. Diabetes mellitus type 2-diet controlled.  5. History of peripheral vascular disease-patient is status post bilateral AKA.  6. History of gout-continue allopurinol.  7. History of neuropathy-continue gabapentin.     CBC: Recent Labs  Lab 07/15/18 1533 07/16/18 0301 07/17/18 0329  WBC 4.3 4.6 3.9*  HGB 13.5 12.1* 12.7*  HCT 42.9 38.0* 38.8*  MCV 95.5 94.3 93.5  PLT 255 212 638    Basic Metabolic Panel: Recent Labs  Lab 07/15/18 1533 07/16/18 1023 07/17/18 0329  NA 139 138 138  K 3.8 3.7 3.5  CL 103 104 98  CO2 27 26 28    GLUCOSE 173* 142* 100*  BUN 15 15 17   CREATININE 1.62* 1.52* 1.49*  CALCIUM 8.6* 8.7* 8.6*  MG 2.1  --   --      DVT prophylaxis: IV heparin  Code Status: Full code  Family Communication: No family at bedside  Disposition Plan: likely home when medically ready for discharge     Consultants:  Cardiology  Procedures:  Echocardiogram   Antibiotics:   Anti-infectives (From admission, onward)   None       Objective   Vitals:   07/17/18 0500 07/17/18 0501 07/17/18 0902 07/17/18 1144  BP:  125/88 132/68 113/85  Pulse:  74 80 77  Resp:  18 18 18   Temp:  (!) 97.5 F (36.4 C)    TempSrc:  Oral    SpO2:  94% 95% 100%  Weight: 81.1 kg     Height:        Intake/Output Summary (Last 24 hours) at 07/17/2018 1156 Last data filed at 07/17/2018 7564 Gross per 24 hour  Intake 1593.65 ml  Output 3900 ml  Net -2306.35 ml   Filed Weights   07/16/18 0446 07/17/18 0024 07/17/18 0500  Weight: 83 kg 81.1 kg 81.1 kg     Physical Examination:    General: Appears in no acute distress  Cardiovascular: S1-S2, regular  Respiratory: Decreased breath sounds bilaterally at lung bases  Abdomen: Abdomen is soft, nontender, no organomegaly  Extremities: Status post bilateral AKA  Neurologic: Alert, oriented x3, no focal deficit noted     Data Reviewed: I have personally reviewed following labs and imaging studies  No results found for this or any previous visit (from the past 240 hour(s)).   Liver Function Tests: Recent Labs  Lab 07/15/18 1533  AST 24  ALT 9  ALKPHOS 48  BILITOT 1.5*  PROT 7.0  ALBUMIN 2.8*   No results for input(s): LIPASE, AMYLASE in the last 168 hours. No results for input(s): AMMONIA in the last 168 hours.  Cardiac Enzymes: Recent Labs  Lab 07/15/18 1623 07/15/18 1924 07/16/18 0301  TROPONINI 0.09* 0.10* 0.10*   BNP (last 3 results) Recent Labs    07/15/18 1533  BNP 1,151.4*    ProBNP (last 3 results) No results for  input(s): PROBNP in the last 8760 hours.    Studies: Dg Chest 2 View  Result Date: 07/15/2018 CLINICAL DATA:  Shortness of breath EXAM: CHEST - 2 VIEW COMPARISON:  10/04/2017 FINDINGS: Small bilateral pleural effusions. Cardiomegaly with vascular congestion and mild pulmonary edema. Atelectasis or pneumonia at the left base. IMPRESSION: 1. Cardiomegaly with vascular congestion, interstitial pulmonary edema and small pleural effusions. 2. Left basilar atelectasis or pneumonia Electronically Signed   By: Donavan Foil M.D.   On: 07/15/2018 16:14    Scheduled Meds: . allopurinol  300 mg Oral Daily  . diltiazem  60 mg Oral Q8H  . feeding supplement (PRO-STAT SUGAR FREE 64)  30 mL Oral BID  . furosemide  40 mg Intravenous BID  . gabapentin  200 mg Oral BID   And  . gabapentin  300 mg Oral QHS  . hydrocerin  1 application Topical BID    Admission status: Inpatient: Based on patients clinical presentation and evaluation of above clinical data, I have made determination that patient meets Inpatient criteria at this time.  Time spent: [facility] Pager [phone]. If 7PM-7AM, please contact night-coverage at [email or web address], Office  [phone]  password [email or web address]  07/17/2018, 11:56 AM  LOS: 2 days

## 2018-07-17 NOTE — Progress Notes (Signed)
Per MD [place] patient needs an EKG and he can be d/c home. EKG completed. Per MD ok to be d/c

## 2018-08-16 ENCOUNTER — Other Ambulatory Visit: Payer: Self-pay | Admitting: Family Medicine

## 2018-08-16 ENCOUNTER — Telehealth: Payer: Self-pay | Admitting: Family Medicine

## 2018-08-16 DIAGNOSIS — M792 Neuralgia and neuritis, unspecified: Secondary | ICD-10-CM

## 2018-08-16 MED ORDER — GABAPENTIN 100 MG PO CAPS: ORAL_CAPSULE | ORAL | 4 refills | Status: DC

## 2018-08-16 NOTE — Telephone Encounter (Signed)
1) Medication(s) Requested (by name): gabapentin (NEURONTIN) 100 MG capsule   2) Pharmacy of Choice: [facility] [place], [place]  3) Special Requests:   Approved medications will be sent to the pharmacy, we will reach out if there is an issue.  Requests made after 3pm may not be addressed until the following business day!  If a patient is unsure of the name of the medication(s) please note and ask patient to call back when they are able to provide all info, do not send to responsible party until all information is available!

## 2018-08-16 NOTE — Telephone Encounter (Signed)
Chart reviewed and patient last reported taking gabapentin 100 mg-2 in the morning, 2 in the evening and 3 at bedtime and RX was sent into [facility] with these directions for a 30 day supply with 4 refills

## 2018-08-16 NOTE — Telephone Encounter (Signed)
Medication sent to [facility] with 4 additional refills.

## 2018-08-16 NOTE — Progress Notes (Signed)
Patient ID: [name] [name], male   DOB: 1927-01-06, 85 y.o.   MRN: [id number]   Patient left phone message requesting refill of gabapentin however was not clear on the dose. We refill as per current records in chart.

## 2018-08-30 ENCOUNTER — Ambulatory Visit (HOSPITAL_COMMUNITY)
Admission: RE | Admit: 2018-08-30 | Discharge: 2018-08-30 | Disposition: A | Discharge status: Home / Self Care | Payer: Medicare Other | Source: Ambulatory Visit | Attending: Physician Assistant | Admitting: Physician Assistant

## 2018-08-30 ENCOUNTER — Telehealth (HOSPITAL_COMMUNITY): Payer: Self-pay | Admitting: *Deleted

## 2018-08-30 ENCOUNTER — Other Ambulatory Visit: Payer: Self-pay

## 2018-08-30 ENCOUNTER — Other Ambulatory Visit (HOSPITAL_COMMUNITY): Payer: Self-pay | Admitting: *Deleted

## 2018-08-30 VITALS — BP 125/78

## 2018-08-30 DIAGNOSIS — I483 Typical atrial flutter: Secondary | ICD-10-CM

## 2018-08-30 DIAGNOSIS — I480 Paroxysmal atrial fibrillation: Secondary | ICD-10-CM

## 2018-08-30 NOTE — Telephone Encounter (Signed)
125/78 with a regular rate although pts daughter could not give a number.  She reports that pt is doing well and having no SOB.  Meds and allergies reconciled.

## 2018-08-30 NOTE — Progress Notes (Signed)
Electrophysiology TeleHealth Note   Due to national recommendations of social distancing due to [place] 19, Audio telehealth visit is felt to be most appropriate for this patient at this time.  See consent below from today for patient consent regarding telehealth for the Atrial Fibrillation Clinic. Consent obtained verbally.    Date:  08/30/2018   ID:  [name] [name], DOB [birth year], MRN [id number]  Location: home  Provider location: [address] [name], [place] [address] Evaluation Performed: Follow up  PCP:  [name], MD  Primary Cardiologist:  Dr Oval Linsey Primary Electrophysiologist: Dr Curt Bears  CC: Follow up for atrial flutter   History of Present Illness: [name] [name] is a 85 y.o. male who presents via audio conferencing for a telehealth visit today with his daughter [name] [name]. Patient was admitted 07/15/18 with new onset atrial flutter with RVR and acute systolic CHF possibly tachycardia mediated. He converted to SR during his admission. Since then, he and his daughter report taht he has done well. He states he "feels good" and is "breathing wonderful." Tolerating metoprolol and Eliquis.   Today, he denies symptoms of palpitations, chest pain, shortness of breath, orthopnea, PND, lower extremity edema, claudication, dizziness, presyncope, syncope, bleeding, or neurologic sequela. The patient is tolerating medications without difficulties and is otherwise without complaint today.   he denies symptoms of cough, fevers, chills, or new SOB worrisome for COVID 19.     Atrial Fibrillation Risk Factors:  he does not have symptoms or diagnosis of sleep apnea. he does not have a history of rheumatic fever. he does not have a history of alcohol use. The patient does not have a history of early familial atrial fibrillation or other arrhythmias.  he has a BMI of There is no height or weight on file to calculate BMI..  BP 125/78 Provided by pt with home BP machine.  Past Medical History:   Diagnosis Date  . Chronic kidney disease    RENAL INSUFICCIENCY  . Diabetes mellitus without complication ([place])   . DVT (deep venous thrombosis) ([place])   . Gout   . Hypertension   . Lymphadenitis 08/10/2012  . Psoriasis    Past Surgical History:  Procedure Laterality Date  . AMPUTATION Bilateral 10/08/2017   Procedure: Bilateral  ABOVE KNEE amputations;  Surgeon: Conrad [place], MD;  Location: [facility] OR;  Service: Vascular;  Laterality: Bilateral;     Current Outpatient Medications  Medication Sig Dispense Refill  . apixaban (ELIQUIS) 5 MG TABS tablet Take 1 tablet (5 mg total) by mouth 2 (two) times daily. 60 tablet 2  . furosemide (LASIX) 40 MG tablet Take 1 tablet (40 mg total) by mouth daily. 30 tablet 3  . gabapentin (NEURONTIN) 100 MG capsule Patient takes 2 Capsule in the Morning, 2 Capsule every evening and 3 Capsules at Bedtime. 210 capsule 4  . losartan (COZAAR) 50 MG tablet Take 1 tablet (50 mg total) by mouth daily. 30 tablet 3  . metoprolol succinate (TOPROL-XL) 100 MG 24 hr tablet Take 1 tablet (100 mg total) by mouth daily. Take with or immediately following a meal. 30 tablet 3  . triamcinolone ointment (KENALOG) 0.1 % Apply 1 application topically 2 (two) times daily. To affected skin areas as needed 30 g 6   No current facility-administered medications for this encounter.     Allergies:   Patient has no known allergies.   Social History:  The patient  reports that he has quit smoking. His smoking use included pipe.  He quit after 40.00 years of use. He has never used smokeless tobacco. He reports that he does not drink alcohol or use drugs.   Family History:  The patient's  family history includes Hypertension in his father and mother; Stroke in his mother.    ROS:  Please see the history of present illness.   All other systems are personally reviewed and negative.   Recent Labs: 07/15/2018: ALT 9; B Natriuretic Peptide 1,151.4; Magnesium 2.1; TSH 3.091 07/17/2018: BUN  17; Creatinine, Ser 1.49; Hemoglobin 12.7; Platelets 225; Potassium 3.5; Sodium 138  personally reviewed    Other studies personally reviewed: Additional studies/ records that were reviewed today include: Epic notes, echocardiogram    Echo 07/16/18 1. The left ventricle has a visually estimated ejection fraction of approximately 15-20%. The cavity size was normal. There is moderately increased left ventricular wall thickness. There is diffuse hypokinesis. Left ventricular diastolic Doppler  parameters are indeterminate in the setting of atrial fibrillation.  2. The right ventricle has severely reduced systolic function. The cavity was moderately enlarged. There is no increase in right ventricular wall thickness. Right ventricular systolic pressure normal with an estimated pressure of 32.9 mmHg.  3. Left atrial size was severely dilated.  4. The pericardial effusion is posterior to the left ventricle.  5. Trivial pericardial effusion is present.  6. Left pleural effusion is preseent.  7. The aortic valve is tricuspid. Mild calcification of the aortic valve. Mild to moderate aortic annular calcification noted.  8. The mitral valve is abnormal. Mild thickening of the mitral valve leaflet. There is mild mitral annular calcification present. Mitral valve regurgitation is mild to moderate by color flow Doppler.  9. The aortic root is normal in size and structure. 10. The inferior vena cava was dilated in size with >50% respiratory variability.   ASSESSMENT AND PLAN:  1. Typical atrial flutter Patient presented to hospital with new onset atrial flutter with RVR. No symptoms since then. General education about atrial flutter and anticoagulation given and questions answered. After discussing therapeutic options with pt and daughter, it was decided to pursue a more conservative approach given pt's age and limited functional capacity.  Continue Toprol 100 mg daily Continue Eliquis 5 mg BID for now.  Recheck Bmet/CBC   This patients CHA2DS2-VASc Score and unadjusted Ischemic Stroke Rate (% per year) is equal to 7.2 % stroke rate/year from a score of 5  Above score calculated as 1 point each if present [CHF, HTN, DM, Vascular=MI/PAD/Aortic Plaque, Age if 65-74, or Male] Above score calculated as 2 points each if present [Age > 75, or Stroke/TIA/TE]  2. Nonischemic Cardiomyopathy Possibly tachycardia mediated. Recent echo EF 15-20%. No symptoms of fluid overload. Continue toprol and losartan. Would recheck echo in two months.  3. PAD S/p AKA.   COVID screen The patient does not have any symptoms that suggest any further testing/ screening at this time.  Social distancing reinforced today.    Follow-up with AF clinic in 2 weeks.  Current medicines are reviewed at length with the patient today.   The patient does not have concerns regarding his medicines.  The following changes were made today:  none  Labs/ tests ordered today include: CBC/Bmet No orders of the defined types were placed in this encounter.   Patient Risk:  after full review of this patients clinical status, I feel that they are at moderate risk at this time.   Today, I have spent 16 minutes with the patient with telehealth  technology discussing atrial flutter, heart failure, and COVID-19 precautions.    Gwenlyn Perking PA-C 08/30/2018 12:18 PM  [facility] [address] [place], [place] [address] [phone]   I hereby voluntarily request, consent and authorize the [facility] and its employed or contracted physicians, physician assistants, nurse practitioners or other licensed health care professionals (the Practitioner), to provide me with telemedicine health care services (the "Services") as deemed necessary by the treating Practitioner. I acknowledge and consent to receive the Services by the Practitioner via telemedicine. I understand that the telemedicine  visit will involve communicating with the Practitioner through live audiovisual communication technology and the disclosure of certain medical information by electronic transmission. I acknowledge that I have been given the opportunity to request an in-person assessment or other available alternative prior to the telemedicine visit and am voluntarily participating in the telemedicine visit.   I understand that I have the right to withhold or withdraw my consent to the use of telemedicine in the course of my care at any time, without affecting my right to future care or treatment, and that the Practitioner or I may terminate the telemedicine visit at any time. I understand that I have the right to inspect all information obtained and/or recorded in the course of the telemedicine visit and may receive copies of available information for a reasonable fee.  I understand that some of the potential risks of receiving the Services via telemedicine include:   Delay or interruption in medical evaluation due to technological equipment failure or disruption;  Information transmitted may not be sufficient (e.g. poor resolution of images) to allow for appropriate medical decision making by the Practitioner; and/or  In rare instances, security protocols could fail, causing a breach of personal health information.   Furthermore, I acknowledge that it is my responsibility to provide information about my medical history, conditions and care that is complete and accurate to the best of my ability. I acknowledge that Practitioner's advice, recommendations, and/or decision may be based on factors not within their control, such as incomplete or inaccurate data provided by me or distortions of diagnostic images or specimens that may result from electronic transmissions. I understand that the practice of medicine is not an exact science and that Practitioner makes no warranties or guarantees regarding treatment outcomes. I  acknowledge that I will receive a copy of this consent concurrently upon execution via email to the email address I last provided but may also request a printed copy by calling the office of the [facility].  I understand that my insurance will be billed for this visit.   I have read or had this consent read to me.  I understand the contents of this consent, which adequately explains the benefits and risks of the Services being provided via telemedicine.  I have been provided ample opportunity to ask questions regarding this consent and the Services and have had my questions answered to my satisfaction.  I give my informed consent for the services to be provided through the use of telemedicine in my medical care  By participating in this telemedicine visit I agree to the above.

## 2018-09-13 ENCOUNTER — Other Ambulatory Visit: Payer: Self-pay

## 2018-09-13 ENCOUNTER — Other Ambulatory Visit (HOSPITAL_COMMUNITY): Payer: Self-pay | Admitting: *Deleted

## 2018-09-13 ENCOUNTER — Ambulatory Visit (HOSPITAL_COMMUNITY)
Admission: RE | Admit: 2018-09-13 | Discharge: 2018-09-13 | Disposition: A | Discharge status: Home / Self Care | Payer: Medicare Other | Source: Ambulatory Visit | Attending: Physician Assistant | Admitting: Physician Assistant

## 2018-09-13 DIAGNOSIS — I483 Typical atrial flutter: Secondary | ICD-10-CM | POA: Diagnosis not present

## 2018-09-13 DIAGNOSIS — I4819 Other persistent atrial fibrillation: Secondary | ICD-10-CM

## 2018-09-13 NOTE — Progress Notes (Signed)
Electrophysiology TeleHealth Note   Due to national recommendations of social distancing due to [place] 19, Audio telehealth visit is felt to be most appropriate for this patient at this time.  See consent below from today for patient consent regarding telehealth for the Atrial Fibrillation Clinic. Consent obtained verbally.    Date:  09/13/2018   ID:  [name] [name], DOB 12-12-1926, MRN [id number]  Location: home  Provider location: [address] [place], [place] [address] Evaluation Performed: Follow up  PCP:  [name], MD  Primary Cardiologist:  Dr Oval Linsey Primary Electrophysiologist: Dr Curt Bears  CC: Follow up for atrial flutter   History of Present Illness: [name] [name] is a 85 y.o. male who presents via audio conferencing for a telehealth visit today with his daughter [name] [name]. Patient was admitted 07/15/18 with new onset atrial flutter with RVR and acute systolic CHF possibly tachycardia mediated. He converted to SR during his admission. Since his last visit with [place] his daughter reports that he continues to do very well. He has had a good amount of energy and has been able to get out of the house. Denies any SOB or fluid retention. Denies any bleeding issues.  Today, he denies symptoms of palpitations, chest pain, shortness of breath, orthopnea, PND, lower extremity edema, claudication, dizziness, presyncope, syncope, bleeding, or neurologic sequela. The patient is tolerating medications without difficulties and is otherwise without complaint today.   he denies symptoms of cough, fevers, chills, or new SOB worrisome for COVID 19.     Atrial Fibrillation Risk Factors:  he does not have symptoms or diagnosis of sleep apnea. he does not have a history of rheumatic fever. he does not have a history of alcohol use. The patient does not have a history of early familial atrial fibrillation or other arrhythmias.  he has a BMI of There is no height or weight on file to calculate BMI.Marland Kitchen   Past Medical History:  Diagnosis Date  . Chronic kidney disease    RENAL INSUFICCIENCY  . Diabetes mellitus without complication ([place])   . DVT (deep venous thrombosis) ([place])   . Gout   . Hypertension   . Lymphadenitis 08/10/2012  . Psoriasis    Past Surgical History:  Procedure Laterality Date  . AMPUTATION Bilateral 10/08/2017   Procedure: Bilateral  ABOVE KNEE amputations;  Surgeon: Conrad Amanda Park, MD;  Location: [facility] OR;  Service: Vascular;  Laterality: Bilateral;     Current Outpatient Medications  Medication Sig Dispense Refill  . apixaban (ELIQUIS) 5 MG TABS tablet Take 1 tablet (5 mg total) by mouth 2 (two) times daily. 60 tablet 2  . furosemide (LASIX) 40 MG tablet Take 1 tablet (40 mg total) by mouth daily. 30 tablet 3  . gabapentin (NEURONTIN) 100 MG capsule Patient takes 2 Capsule in the Morning, 2 Capsule every evening and 3 Capsules at Bedtime. 210 capsule 4  . losartan (COZAAR) 50 MG tablet Take 1 tablet (50 mg total) by mouth daily. 30 tablet 3  . metoprolol succinate (TOPROL-XL) 100 MG 24 hr tablet Take 1 tablet (100 mg total) by mouth daily. Take with or immediately following a meal. 30 tablet 3  . triamcinolone ointment (KENALOG) 0.1 % Apply 1 application topically 2 (two) times daily. To affected skin areas as needed 30 g 6   No current facility-administered medications for this visit.     Allergies:   Patient has no known allergies.   Social History:  The patient  reports that he  has quit smoking. His smoking use included pipe. He quit after 40.00 years of use. He has never used smokeless tobacco. He reports that he does not drink alcohol or use drugs.   Family History:  The patient's  family history includes Hypertension in his father and mother; Stroke in his mother.    ROS:  Please see the history of present illness.   All other systems are personally reviewed and negative.   Recent Labs: 07/15/2018: ALT 9; B Natriuretic Peptide 1,151.4; Magnesium 2.1; TSH  3.091 07/17/2018: BUN 17; Creatinine, Ser 1.49; Hemoglobin 12.7; Platelets 225; Potassium 3.5; Sodium 138  personally reviewed    Other studies personally reviewed: Additional studies/ records that were reviewed today include: Epic notes, echocardiogram    Echo 07/16/18 1. The left ventricle has a visually estimated ejection fraction of approximately 15-20%. The cavity size was normal. There is moderately increased left ventricular wall thickness. There is diffuse hypokinesis. Left ventricular diastolic Doppler  parameters are indeterminate in the setting of atrial fibrillation.  2. The right ventricle has severely reduced systolic function. The cavity was moderately enlarged. There is no increase in right ventricular wall thickness. Right ventricular systolic pressure normal with an estimated pressure of 32.9 mmHg.  3. Left atrial size was severely dilated.  4. The pericardial effusion is posterior to the left ventricle.  5. Trivial pericardial effusion is present.  6. Left pleural effusion is preseent.  7. The aortic valve is tricuspid. Mild calcification of the aortic valve. Mild to moderate aortic annular calcification noted.  8. The mitral valve is abnormal. Mild thickening of the mitral valve leaflet. There is mild mitral annular calcification present. Mitral valve regurgitation is mild to moderate by color flow Doppler.  9. The aortic root is normal in size and structure. 10. The inferior vena cava was dilated in size with >50% respiratory variability.   ASSESSMENT AND PLAN:  1. Typical atrial flutter Patient asymptomatic. Recall conservative approach favored by patient and daughter. Continue Toprol 100 mg daily Continue Eliquis 5 mg BID for now. Patient's daughter to make appt for him to have labs.  This patients CHA2DS2-VASc Score and unadjusted Ischemic Stroke Rate (% per year) is equal to 7.2 % stroke rate/year from a score of 5  Above score calculated as 1 point each if  present [CHF, HTN, DM, Vascular=MI/PAD/Aortic Plaque, Age if 65-74, or Male] Above score calculated as 2 points each if present [Age > 75, or Stroke/TIA/TE]  2. Nonischemic Cardiomyopathy Possibly tachycardia mediated. Recent echo EF 15-20%. No symptoms of fluid overload. Continue toprol and losartan. Recheck echo end of June or July.  3. PAD S/p AKA.   COVID screen The patient does not have any symptoms that suggest any further testing/ screening at this time.  Social distancing reinforced today.    Follow-up with AF clinic in 2 months.  Current medicines are reviewed at length with the patient today.   The patient does not have concerns regarding his medicines.  The following changes were made today:  none  Labs/ tests ordered today include: No orders of the defined types were placed in this encounter.   Patient Risk:  after full review of this patients clinical status, I feel that they are at moderate risk at this time.   Today, I have spent 7 minutes with the patient and daughter with telehealth technology discussing atrial flutter, heart failure, and COVID-19 precautions.    Gwenlyn Perking PA-C 09/13/2018 9:09 AM  [facility]  [place], [place] [address] [phone]   I hereby voluntarily request, consent and authorize the [facility] and its employed or contracted physicians, physician assistants, nurse practitioners or other licensed health care professionals (the Practitioner), to provide me with telemedicine health care services (the "Services") as deemed necessary by the treating Practitioner. I acknowledge and consent to receive the Services by the Practitioner via telemedicine. I understand that the telemedicine visit will involve communicating with the Practitioner through live audiovisual communication technology and the disclosure of certain medical information by electronic transmission. I acknowledge  that I have been given the opportunity to request an in-person assessment or other available alternative prior to the telemedicine visit and am voluntarily participating in the telemedicine visit.   I understand that I have the right to withhold or withdraw my consent to the use of telemedicine in the course of my care at any time, without affecting my right to future care or treatment, and that the Practitioner or I may terminate the telemedicine visit at any time. I understand that I have the right to inspect all information obtained and/or recorded in the course of the telemedicine visit and may receive copies of available information for a reasonable fee.  I understand that some of the potential risks of receiving the Services via telemedicine include:   Delay or interruption in medical evaluation due to technological equipment failure or disruption;  Information transmitted may not be sufficient (e.g. poor resolution of images) to allow for appropriate medical decision making by the Practitioner; and/or  In rare instances, security protocols could fail, causing a breach of personal health information.   Furthermore, I acknowledge that it is my responsibility to provide information about my medical history, conditions and care that is complete and accurate to the best of my ability. I acknowledge that Practitioner's advice, recommendations, and/or decision may be based on factors not within their control, such as incomplete or inaccurate data provided by me or distortions of diagnostic images or specimens that may result from electronic transmissions. I understand that the practice of medicine is not an exact science and that Practitioner makes no warranties or guarantees regarding treatment outcomes. I acknowledge that I will receive a copy of this consent concurrently upon execution via email to the email address I last provided but may also request a printed copy by calling the office of the [facility].  I understand that my insurance will be billed for this visit.   I have read or had this consent read to me.  I understand the contents of this consent, which adequately explains the benefits and risks of the Services being provided via telemedicine.  I have been provided ample opportunity to ask questions regarding this consent and the Services and have had my questions answered to my satisfaction.  I give my informed consent for the services to be provided through the use of telemedicine in my medical care  By participating in this telemedicine visit I agree to the above.

## 2018-10-06 ENCOUNTER — Ambulatory Visit: Payer: Medicare Other | Admitting: Family Medicine

## 2018-10-08 ENCOUNTER — Ambulatory Visit: Payer: Medicare Other | Attending: Family Medicine | Admitting: Family Medicine

## 2018-10-08 ENCOUNTER — Encounter: Payer: Self-pay | Admitting: Family Medicine

## 2018-10-08 ENCOUNTER — Other Ambulatory Visit: Payer: Self-pay

## 2018-10-08 VITALS — BP 128/75 | HR 75 | Temp 98.6°F

## 2018-10-08 DIAGNOSIS — D649 Anemia, unspecified: Secondary | ICD-10-CM

## 2018-10-08 DIAGNOSIS — N183 Chronic kidney disease, stage 3 unspecified: Secondary | ICD-10-CM

## 2018-10-08 DIAGNOSIS — I5032 Chronic diastolic (congestive) heart failure: Secondary | ICD-10-CM

## 2018-10-08 DIAGNOSIS — Z79899 Other long term (current) drug therapy: Secondary | ICD-10-CM

## 2018-10-08 DIAGNOSIS — E1159 Type 2 diabetes mellitus with other circulatory complications: Secondary | ICD-10-CM

## 2018-10-08 DIAGNOSIS — I4891 Unspecified atrial fibrillation: Secondary | ICD-10-CM

## 2018-10-08 DIAGNOSIS — Z89611 Acquired absence of right leg above knee: Secondary | ICD-10-CM

## 2018-10-08 DIAGNOSIS — N2889 Other specified disorders of kidney and ureter: Secondary | ICD-10-CM

## 2018-10-08 DIAGNOSIS — Z89612 Acquired absence of left leg above knee: Secondary | ICD-10-CM

## 2018-10-08 DIAGNOSIS — Z7901 Long term (current) use of anticoagulants: Secondary | ICD-10-CM

## 2018-10-08 DIAGNOSIS — I4892 Unspecified atrial flutter: Secondary | ICD-10-CM

## 2018-10-08 DIAGNOSIS — M1A09X0 Idiopathic chronic gout, multiple sites, without tophus (tophi): Secondary | ICD-10-CM

## 2018-10-08 DIAGNOSIS — I739 Peripheral vascular disease, unspecified: Secondary | ICD-10-CM | POA: Diagnosis not present

## 2018-10-08 DIAGNOSIS — L409 Psoriasis, unspecified: Secondary | ICD-10-CM

## 2018-10-08 DIAGNOSIS — M792 Neuralgia and neuritis, unspecified: Secondary | ICD-10-CM

## 2018-10-08 MED ORDER — FUROSEMIDE 40 MG PO TABS: 40.0000 mg | ORAL_TABLET | Freq: Every day | ORAL | 5 refills | Status: DC

## 2018-10-08 MED ORDER — METOPROLOL SUCCINATE ER 100 MG PO TB24: 100.0000 mg | ORAL_TABLET | Freq: Every day | ORAL | 3 refills | Status: DC

## 2018-10-08 MED ORDER — LOSARTAN POTASSIUM 50 MG PO TABS: 50.0000 mg | ORAL_TABLET | Freq: Every day | ORAL | 5 refills | Status: DC

## 2018-10-08 MED ORDER — APIXABAN 5 MG PO TABS: 5.0000 mg | ORAL_TABLET | Freq: Two times a day (BID) | ORAL | 5 refills | Status: DC

## 2018-10-08 MED ORDER — GABAPENTIN 100 MG PO CAPS: ORAL_CAPSULE | ORAL | 4 refills | Status: DC

## 2018-10-08 MED ORDER — TRIAMCINOLONE ACETONIDE 0.1 % EX OINT: 1.0000 "application " | TOPICAL_OINTMENT | Freq: Two times a day (BID) | CUTANEOUS | 6 refills | Status: DC

## 2018-10-08 NOTE — Progress Notes (Signed)
Patient daughter states that patient went to the Cardiologist and they informed him to come to his PCP to get labs done and to just get a check up. Per pt daughter they did not say what type of labs they needed patient to get.

## 2018-10-08 NOTE — Progress Notes (Signed)
Established Patient Office Visit  Subjective:  Patient ID: [name] [name], male    DOB: 01-04-1927  Age: 85 y.o. MRN: [id number]  CC:  Chief Complaint  Patient presents with  . Diabetes  . Atrial Fibrillation    HPI [name] [name] presents for follow-up of chronic medical issues and is status post hospitalization on 07/15/2018 as he presented to the office with tachycardia and appearance of generalized peripheral edema and was sent to the emergency department for further evaluation and treatment due to tachycardia and suspected CHF exacerbation.  Patient was diagnosed with atrial fibrillation with rapid ventricular rate, atrial fibrillation and flutter, acute on chronic heart failure in addition to his hypertension, type 2 diabetes, gout, stage III chronic kidney disease and patient with history of bilateral AKA due to peripheral vascular disease.  Patient had echocardiogram with ejection fraction of 15 to 20% and diffuse hypokinesis and moderately increased left ventricular wall thickness.  He was started on Eliquis 5 mg daily.  At time of hospital discharge, patient was back in normal sinus rhythm.  During his hospitalization he was diuresed with IV Lasix for CHF and placed on Lasix 40 mg daily as well as Toprol-XL 100 mg daily and losartan 50 mg daily for hypertension, CHF and rate control.  Patient was instructed to continue his allopurinol due to his history of gout and continue gabapentin for history of neuropathy.  Patient with creatinine of 1.49 on 07/17/2018 and hemoglobin of 12.7 with normal MCV.  Patient missed 08/30/2018 follow-up visit as well as visit scheduled yesterday but did return for visit today.  He does have a history of noncompliance with medical follow-up as well as noncompliance with medications.  He did have virtual visit with cardiology-atrial fibrillation clinic, on 09/13/2018 without any changes in medication.       At today's visit, he reports that he does feel a lot better than at  his last visit prior to his hospitalization.  He reports that he still has some occasional shortness of breath but this is improved.  He has a hospital bed and states that he can lie flat without increased shortness of breath.  He does not feel as if he has any increase in fluid weight.  He denies any chest pain or sensation of palpitations or increased heart rate.  He denies any headaches or dizziness related to his blood pressure.  He denies any increases in joint pain or joint swelling related to gout.  He continues to have some nerve type pain status post bilateral AKA due to peripheral vascular disease.  He reports that Neurontin does help with his pain and also helps him fall asleep.  He denies any issues with unusual bruising or bleeding related to his use of Eliquis.  He has had no abdominal pain, no nausea or vomiting, no blood in the stool or black stool.  He does not really monitor his blood sugars at home but he feels that his diabetes remains well controlled.  He denies any urinary frequency, urgency or dysuria.  No increased thirst.  He does have some fatigue.  He continues to have chronic issues with dry skin/psoriasis and flaking of the skin.  He has had no recent fever or chills.  Past Medical History:  Diagnosis Date  . Chronic kidney disease    RENAL INSUFICCIENCY  . Diabetes mellitus without complication ([place])   . DVT (deep venous thrombosis) ([place])   . Gout   . Hypertension   . Lymphadenitis 08/10/2012  .  Psoriasis     Past Surgical History:  Procedure Laterality Date  . AMPUTATION Bilateral 10/08/2017   Procedure: Bilateral  ABOVE KNEE amputations;  Surgeon: Conrad [place], MD;  Location: [facility] OR;  Service: Vascular;  Laterality: Bilateral;    Family History  Problem Relation Age of Onset  . Hypertension Mother   . Stroke Mother   . Hypertension Father    Social History   Tobacco Use  . Smoking status: Former Smoker    Years: 40.00    Types: Pipe  . Smokeless tobacco:  Never Used  Substance Use Topics  . Alcohol use: No  . Drug use: No    Outpatient Medications Prior to Visit  Medication Sig Dispense Refill  . apixaban (ELIQUIS) 5 MG TABS tablet Take 1 tablet (5 mg total) by mouth 2 (two) times daily. 60 tablet 2  . furosemide (LASIX) 40 MG tablet Take 1 tablet (40 mg total) by mouth daily. 30 tablet 3  . gabapentin (NEURONTIN) 100 MG capsule Patient takes 2 Capsule in the Morning, 2 Capsule every evening and 3 Capsules at Bedtime. 210 capsule 4  . losartan (COZAAR) 50 MG tablet Take 1 tablet (50 mg total) by mouth daily. 30 tablet 3  . metoprolol succinate (TOPROL-XL) 100 MG 24 hr tablet Take 1 tablet (100 mg total) by mouth daily. Take with or immediately following a meal. 30 tablet 3  . triamcinolone ointment (KENALOG) 0.1 % Apply 1 application topically 2 (two) times daily. To affected skin areas as needed 30 g 6   No facility-administered medications prior to visit.     No Known Allergies  ROS Review of Systems  Constitutional: Positive for fatigue (At times). Negative for chills and fever.  HENT: Negative for sore throat and trouble swallowing.   Respiratory: Negative for cough and shortness of breath.   Cardiovascular: Negative for chest pain, palpitations and leg swelling.  Gastrointestinal: Positive for constipation. Negative for abdominal pain, blood in stool, diarrhea and nausea.  Endocrine: Negative for polydipsia, polyphagia and polyuria.  Genitourinary: Negative for dysuria and frequency.  Musculoskeletal: Positive for gait problem. Negative for back pain.  Neurological: Negative for dizziness and headaches.  Hematological: Negative for adenopathy. Does not bruise/bleed easily.      Objective:    Physical Exam  Constitutional: He is oriented to person, place, and time. He appears well-developed and well-nourished.  Well-nourished well-developed overweight for height appearing elderly male in no acute distress, sitting in a  powered wheelchair, patient with dry flaky skin debris on his shirt; patient with some decreased hearing  Cardiovascular: Regular rhythm.  Patient with bradycardia on exam  Pulmonary/Chest: Effort normal and breath sounds normal. He has no rales.  Abdominal: Soft. There is no abdominal tenderness. There is no rebound and no guarding.  Musculoskeletal:        General: Deformity (Bilateral AKA) present. No tenderness.  Neurological: He is alert and oriented to person, place, and time.  Skin: Skin is warm and dry.  Patient with dry flaky scalp with plaque psoriasis as well as some hyperpigmented, dry plaques on the extensor surface of the elbows and areas of hyperpigmentation on the arms bilaterally  Psychiatric: He has a normal mood and affect. His behavior is normal.  Nursing note and vitals reviewed.   There were no vitals taken for this visit. Wt Readings from Last 3 Encounters:  07/17/18 178 lb 12.7 oz (81.1 kg)  12/07/17 240 lb (108.9 kg)  11/19/16 226 lb 1.6 oz (  102.6 kg)     Health Maintenance Due  Topic Date Due  . OPHTHALMOLOGY EXAM  09/30/1938  . PNA vac Low Risk Adult (2 of 2 - PCV13) 08/12/2013  . HEMOGLOBIN A1C  04/06/2018    There are no preventive care reminders to display for this patient.  Lab Results  Component Value Date   TSH 3.091 07/15/2018   Lab Results  Component Value Date   WBC 3.9 (L) 07/17/2018   HGB 12.7 (L) 07/17/2018   HCT 38.8 (L) 07/17/2018   MCV 93.5 07/17/2018   PLT 225 07/17/2018   Lab Results  Component Value Date   NA 138 07/17/2018   K 3.5 07/17/2018   CO2 28 07/17/2018   GLUCOSE 100 (H) 07/17/2018   BUN 17 07/17/2018   CREATININE 1.49 (H) 07/17/2018   BILITOT 1.5 (H) 07/15/2018   ALKPHOS 48 07/15/2018   AST 24 07/15/2018   ALT 9 07/15/2018   PROT 7.0 07/15/2018   ALBUMIN 2.8 (L) 07/15/2018   CALCIUM 8.6 (L) 07/17/2018   ANIONGAP 12 07/17/2018   Lab Results  Component Value Date   CHOL 222 (H) 05/10/2008   Lab  Results  Component Value Date   HDL 51 05/10/2008   Lab Results  Component Value Date   LDLCALC 136 (H) 05/10/2008   Lab Results  Component Value Date   TRIG 176 (H) 05/10/2008   Lab Results  Component Value Date   CHOLHDL 4.4 Ratio 05/10/2008   Lab Results  Component Value Date   HGBA1C 6.7 (H) 10/05/2017      Assessment & Plan:  1. Type 2 diabetes mellitus with other circulatory complication, without long-term current use of insulin ([place]) Patient is type 2 diabetes has remained controlled.  Patient will have hemoglobin A1c at today's visit in follow-up.  His last hemoglobin A1c was 6.7 showing good control.  His blood sugars are currently diet controlled.  He is encouraged to continue healthy, low carbohydrate diet. - Comprehensive metabolic panel - Hemoglobin A1c - Lipid Panel  2. Chronic diastolic CHF (congestive heart failure) ([place]); essential hypertension Patient CHF appears to be controlled at today's visit.  No rales on exam, patient does not have any appearance of generalized or extremity edema (at his last visit, patient had edematous, generalized appearance as well as appearance of edema in the upper legs as he is status post AKA).  Patient has had recent virtual visit with cardiology.  He will continue losartan and metoprolol.  Patient on exam had bradycardia but per vital signs done by RMA, patient with heart rate of 75. - losartan (COZAAR) 50 MG tablet; Take 1 tablet (50 mg total) by mouth daily.  Dispense: 30 tablet; Refill: 5 - metoprolol succinate (TOPROL-XL) 100 MG 24 hr tablet; Take 1 tablet (100 mg total) by mouth daily. Take with or immediately following a meal.  Dispense: 30 tablet; Refill: 3  3. PAD (peripheral artery disease) ([place]) Patient with peripheral arterial disease which caused need for bilateral AKA.  Patient will have CMP and lipid panel at today's visit.  Patient is not currently on statin medication and I am not sure if this is due to age and  comorbidities that statin medication was discontinued.  Will contact patient/daughter when results of lipid panel are known to see if there is any interest in restarting statin medication. - Comprehensive metabolic panel - Lipid Panel  4. Chronic renal insufficiency, stage 3 (moderate) (H[facility]) Patient with chronic renal insufficiency.  Continue to control  blood sugar and blood pressure.  Creatinine will be checked as part of CMP to make sure that is stable and that patient has had no worsening.  Avoid use of nonsteroidal anti-inflammatories which can worsen renal function - Comprehensive metabolic panel  5. Chronic gout of multiple sites, unspecified cause Stable, no acute flareups.  Continue allopurinol  6. S/P AKA (above knee amputation) bilateral (H[facility]) Patient status post bilateral AKA secondary to peripheral vascular disease.  Patient will have lipid panel at today's visit.  Low-fat diet recommended as well as continuing to control his blood pressure and blood sugars - Lipid Panel  7. Atrial fibrillation and flutter [facility]) Patient is on Eliquis due to atrial fibrillation/flutter.  Patient's recent cardiology note reviewed.  Patient also is on metoprolol for rate control.  Patient on exam seem to be bradycardiac but was recorded by our MA as being within normal at 75.  Continue cardiology follow-up.  Notify office or seek ED attention if increased heart rate, shortness of breath, chest pain or any concerns - CBC with Differential - apixaban (ELIQUIS) 5 MG TABS tablet; Take 1 tablet (5 mg total) by mouth 2 (two) times daily.  Dispense: 60 tablet; Refill: 5 - metoprolol succinate (TOPROL-XL) 100 MG 24 hr tablet; Take 1 tablet (100 mg total) by mouth daily. Take with or immediately following a meal.  Dispense: 30 tablet; Refill: 3  8. Encounter for long-term (current) use of medications CMP in follow-up of long-term use of medications for treatment of hypertension/CHF and atrial fibrillation -  Comprehensive metabolic panel  9. Long term current use of anticoagulant therapy; 10. normocytic anemia CBC will be done at today's visit in follow-up of long-term use of anticoagulant therapy, Eliquis as well as patient with normocytic anemia.  Anemia may also be related to patient's chronic kidney disease, stage III  - CBC with Differential  11. Psoriasis Patient with chronic issues with psoriasis.  Refill provided for triamcinolone and referral will be placed to dermatology - Ambulatory referral to Dermatology - triamcinolone ointment (KENALOG) 0.1 %; Apply 1 application topically 2 (two) times daily. To affected skin areas as needed  Dispense: 60 g; Refill: 6  12. Neuropathic pain Gabapentin refilled for continued treatment of neuropathic pain - gabapentin (NEURONTIN) 100 MG capsule; Patient takes 2 Capsule in the Morning, 2 Capsule every evening and 3 Capsules at Bedtime.  Dispense: 210 capsule; Refill: 4   An After Visit Summary was printed and given to the patient.  Follow-up: Return in about 3 months (around 01/08/2019) for chronic issues.   [name], MD

## 2018-10-09 LAB — HEMOGLOBIN A1C

## 2018-10-11 LAB — COMPREHENSIVE METABOLIC PANEL WITH GFR
ALT: 10 IU/L (ref 0–44)
AST: 26 IU/L (ref 0–40)
Albumin/Globulin Ratio: 1.1 — ABNORMAL LOW (ref 1.2–2.2)
Albumin: 3.9 g/dL (ref 3.5–4.6)
Alkaline Phosphatase: 55 IU/L (ref 39–117)
BUN/Creatinine Ratio: 16 (ref 10–24)
BUN: 21 mg/dL (ref 10–36)
Bilirubin Total: 0.7 mg/dL (ref 0.0–1.2)
CO2: 25 mmol/L (ref 20–29)
Calcium: 9.4 mg/dL (ref 8.6–10.2)
Chloride: 102 mmol/L (ref 96–106)
Creatinine, Ser: 1.34 mg/dL — ABNORMAL HIGH (ref 0.76–1.27)
GFR calc Af Amer: 54 mL/min/1.73 — ABNORMAL LOW
GFR calc non Af Amer: 46 mL/min/1.73 — ABNORMAL LOW
Globulin, Total: 3.4 g/dL (ref 1.5–4.5)
Glucose: 92 mg/dL (ref 65–99)
Potassium: 4.1 mmol/L (ref 3.5–5.2)
Sodium: 143 mmol/L (ref 134–144)
Total Protein: 7.3 g/dL (ref 6.0–8.5)

## 2018-10-11 LAB — CBC WITH DIFFERENTIAL/PLATELET
Basophils Absolute: 0 x10E3/uL (ref 0.0–0.2)
Basos: 1 %
EOS (ABSOLUTE): 0.1 x10E3/uL (ref 0.0–0.4)
Eos: 4 %
Hematocrit: 42.5 % (ref 37.5–51.0)
Hemoglobin: 14.2 g/dL (ref 13.0–17.7)
Immature Grans (Abs): 0 x10E3/uL (ref 0.0–0.1)
Immature Granulocytes: 0 %
Lymphocytes Absolute: 1.2 x10E3/uL (ref 0.7–3.1)
Lymphs: 36 %
MCH: 31 pg (ref 26.6–33.0)
MCHC: 33.4 g/dL (ref 31.5–35.7)
MCV: 93 fL (ref 79–97)
Monocytes Absolute: 0.4 x10E3/uL (ref 0.1–0.9)
Monocytes: 12 %
Neutrophils Absolute: 1.6 x10E3/uL (ref 1.4–7.0)
Neutrophils: 47 %
Platelets: 193 x10E3/uL (ref 150–450)
RBC: 4.58 x10E6/uL (ref 4.14–5.80)
RDW: 14.9 % (ref 11.6–15.4)
WBC: 3.3 x10E3/uL — ABNORMAL LOW (ref 3.4–10.8)

## 2018-10-11 LAB — LIPID PANEL
Chol/HDL Ratio: 3.4 ratio (ref 0.0–5.0)
Cholesterol, Total: 197 mg/dL (ref 100–199)
HDL: 58 mg/dL
LDL Calculated: 118 mg/dL — ABNORMAL HIGH (ref 0–99)
Triglycerides: 103 mg/dL (ref 0–149)
VLDL Cholesterol Cal: 21 mg/dL (ref 5–40)

## 2018-10-11 LAB — HEMOGLOBIN A1C

## 2018-10-14 ENCOUNTER — Telehealth: Payer: Self-pay | Admitting: Emergency Medicine

## 2018-10-14 ENCOUNTER — Telehealth: Payer: Self-pay | Admitting: *Deleted

## 2018-10-14 NOTE — Telephone Encounter (Signed)
Staff was informed by lab tech that [facility] just sent an email to her stating the specimen they received for patient was not enough to draw a A1c for patient. Please advice.

## 2018-10-14 NOTE — Telephone Encounter (Signed)
Spoke with patient daughter and informed her with information and an appt was made.

## 2018-10-14 NOTE — Telephone Encounter (Signed)
In his results info, I requested that patient be contacted to have return to obtain a Hgb A1c at his convenience

## 2018-10-15 ENCOUNTER — Encounter: Payer: Self-pay | Admitting: Emergency Medicine

## 2018-10-21 ENCOUNTER — Other Ambulatory Visit: Payer: Medicare Other

## 2018-10-25 ENCOUNTER — Telehealth: Payer: Self-pay | Admitting: *Deleted

## 2018-10-25 ENCOUNTER — Other Ambulatory Visit: Payer: Self-pay | Admitting: Family Medicine

## 2018-10-25 ENCOUNTER — Other Ambulatory Visit: Payer: Self-pay

## 2018-10-25 ENCOUNTER — Ambulatory Visit: Payer: Medicare Other | Attending: Family Medicine

## 2018-10-25 DIAGNOSIS — E1159 Type 2 diabetes mellitus with other circulatory complications: Secondary | ICD-10-CM

## 2018-10-25 NOTE — Telephone Encounter (Signed)
Patient came in to get his repeat for his hemoglobin A1c and lab would like to order to be entered in.   Patient is also would like to know if provider could please send in something for his diarrhea to his pharmacy?

## 2018-10-25 NOTE — Progress Notes (Signed)
Patient ID: [name] [name], male   DOB: 1927/02/07, 85 y.o.   MRN: [id number]   Patient came in today to have hemoglobin A1c which was not done at his most recent visit.  Order for A1c placed.

## 2018-10-25 NOTE — Telephone Encounter (Signed)
I was not aware of his diarrhea. He can try Imodium which he con get over the counter but needs follow-up here or urgent care if his diarrhea is not improving. You may need to cal his daughter with these instructions as well

## 2018-10-26 LAB — HEMOGLOBIN A1C
Est. average glucose Bld gHb Est-mCnc: 120 mg/dL
Hgb A1c MFr Bld: 5.8 % — ABNORMAL HIGH (ref 4.8–5.6)

## 2018-10-27 NOTE — Telephone Encounter (Signed)
Spoke with patient daughter and informed her with what provider stated. Per pt daughter, patient is fine now because she already bought Imodium for patient. Informed patient daughter if the problem continues for her to sch an appt for patient and she verbalized understanding and agreed.

## 2018-11-03 ENCOUNTER — Encounter: Payer: Self-pay | Admitting: Family

## 2018-11-03 ENCOUNTER — Ambulatory Visit: Payer: Medicare Other | Admitting: Vascular Surgery

## 2018-11-11 ENCOUNTER — Other Ambulatory Visit: Payer: Self-pay

## 2018-11-11 ENCOUNTER — Ambulatory Visit: Payer: Medicare Other | Attending: Family Medicine | Admitting: Family Medicine

## 2018-11-23 ENCOUNTER — Inpatient Hospital Stay (HOSPITAL_COMMUNITY)
Admission: EM | Admit: 2018-11-23 | Discharge: 2018-11-25 | DRG: 291 | Disposition: A | Discharge status: Home / Self Care | Payer: Medicare Other | Attending: Internal Medicine | Admitting: Internal Medicine

## 2018-11-23 ENCOUNTER — Encounter (HOSPITAL_COMMUNITY): Payer: Self-pay | Admitting: Emergency Medicine

## 2018-11-23 ENCOUNTER — Emergency Department (HOSPITAL_COMMUNITY): Payer: Medicare Other

## 2018-11-23 ENCOUNTER — Other Ambulatory Visit: Payer: Self-pay

## 2018-11-23 DIAGNOSIS — I4892 Unspecified atrial flutter: Secondary | ICD-10-CM | POA: Diagnosis present

## 2018-11-23 DIAGNOSIS — I130 Hypertensive heart and chronic kidney disease with heart failure and stage 1 through stage 4 chronic kidney disease, or unspecified chronic kidney disease: Secondary | ICD-10-CM | POA: Diagnosis present

## 2018-11-23 DIAGNOSIS — Z993 Dependence on wheelchair: Secondary | ICD-10-CM

## 2018-11-23 DIAGNOSIS — I5043 Acute on chronic combined systolic (congestive) and diastolic (congestive) heart failure: Secondary | ICD-10-CM

## 2018-11-23 DIAGNOSIS — Z7901 Long term (current) use of anticoagulants: Secondary | ICD-10-CM

## 2018-11-23 DIAGNOSIS — Z87891 Personal history of nicotine dependence: Secondary | ICD-10-CM

## 2018-11-23 DIAGNOSIS — E1151 Type 2 diabetes mellitus with diabetic peripheral angiopathy without gangrene: Secondary | ICD-10-CM | POA: Diagnosis present

## 2018-11-23 DIAGNOSIS — Z20828 Contact with and (suspected) exposure to other viral communicable diseases: Secondary | ICD-10-CM | POA: Diagnosis present

## 2018-11-23 DIAGNOSIS — I4891 Unspecified atrial fibrillation: Secondary | ICD-10-CM

## 2018-11-23 DIAGNOSIS — M109 Gout, unspecified: Secondary | ICD-10-CM | POA: Diagnosis present

## 2018-11-23 DIAGNOSIS — N183 Chronic kidney disease, stage 3 (moderate): Secondary | ICD-10-CM | POA: Diagnosis present

## 2018-11-23 DIAGNOSIS — I480 Paroxysmal atrial fibrillation: Secondary | ICD-10-CM | POA: Diagnosis present

## 2018-11-23 DIAGNOSIS — Z89611 Acquired absence of right leg above knee: Secondary | ICD-10-CM | POA: Diagnosis not present

## 2018-11-23 DIAGNOSIS — Z8249 Family history of ischemic heart disease and other diseases of the circulatory system: Secondary | ICD-10-CM

## 2018-11-23 DIAGNOSIS — Z89612 Acquired absence of left leg above knee: Secondary | ICD-10-CM

## 2018-11-23 DIAGNOSIS — R778 Other specified abnormalities of plasma proteins: Secondary | ICD-10-CM | POA: Diagnosis present

## 2018-11-23 DIAGNOSIS — Z823 Family history of stroke: Secondary | ICD-10-CM

## 2018-11-23 DIAGNOSIS — R7989 Other specified abnormal findings of blood chemistry: Secondary | ICD-10-CM | POA: Diagnosis not present

## 2018-11-23 DIAGNOSIS — I5023 Acute on chronic systolic (congestive) heart failure: Secondary | ICD-10-CM | POA: Diagnosis present

## 2018-11-23 DIAGNOSIS — Z86718 Personal history of other venous thrombosis and embolism: Secondary | ICD-10-CM

## 2018-11-23 DIAGNOSIS — I429 Cardiomyopathy, unspecified: Secondary | ICD-10-CM

## 2018-11-23 DIAGNOSIS — L409 Psoriasis, unspecified: Secondary | ICD-10-CM | POA: Diagnosis present

## 2018-11-23 DIAGNOSIS — E1122 Type 2 diabetes mellitus with diabetic chronic kidney disease: Secondary | ICD-10-CM | POA: Diagnosis present

## 2018-11-23 DIAGNOSIS — I739 Peripheral vascular disease, unspecified: Secondary | ICD-10-CM | POA: Diagnosis not present

## 2018-11-23 DIAGNOSIS — L8990 Pressure ulcer of unspecified site, unspecified stage: Secondary | ICD-10-CM | POA: Diagnosis present

## 2018-11-23 LAB — BASIC METABOLIC PANEL
Anion gap: 11 (ref 5–15)
BUN: 14 mg/dL (ref 8–23)
CO2: 27 mmol/L (ref 22–32)
Calcium: 9.6 mg/dL (ref 8.9–10.3)
Chloride: 104 mmol/L (ref 98–111)
Creatinine, Ser: 1.6 mg/dL — ABNORMAL HIGH (ref 0.61–1.24)
GFR calc Af Amer: 43 mL/min — ABNORMAL LOW (ref >60)
GFR calc non Af Amer: 37 mL/min — ABNORMAL LOW (ref >60)
Glucose, Bld: 105 mg/dL — ABNORMAL HIGH (ref 70–99)
Potassium: 4.1 mmol/L (ref 3.5–5.1)
Sodium: 142 mmol/L (ref 135–145)

## 2018-11-23 LAB — CBC
HCT: 49 % (ref 39.0–52.0)
Hemoglobin: 15.8 g/dL (ref 13.0–17.0)
MCH: 32.3 pg (ref 26.0–34.0)
MCHC: 32.2 g/dL (ref 30.0–36.0)
MCV: 100.2 fL — ABNORMAL HIGH (ref 80.0–100.0)
Platelets: 175 10*3/uL (ref 150–400)
RBC: 4.89 MIL/uL (ref 4.22–5.81)
RDW: 15.8 % — ABNORMAL HIGH (ref 11.5–15.5)
WBC: 2.9 10*3/uL — ABNORMAL LOW (ref 4.0–10.5)
nRBC: 0 % (ref 0.0–0.2)

## 2018-11-23 LAB — SARS CORONAVIRUS 2 BY RT PCR (HOSPITAL ORDER, PERFORMED IN ~~LOC~~ HOSPITAL LAB): SARS Coronavirus 2: NEGATIVE

## 2018-11-23 LAB — GLUCOSE, CAPILLARY: Glucose-Capillary: 140 mg/dL — ABNORMAL HIGH (ref 70–99)

## 2018-11-23 LAB — TROPONIN I (HIGH SENSITIVITY)
Troponin I (High Sensitivity): 55 ng/L — ABNORMAL HIGH (ref <18)
Troponin I (High Sensitivity): 59 ng/L — ABNORMAL HIGH (ref <18)

## 2018-11-23 LAB — MRSA PCR SCREENING: MRSA by PCR: POSITIVE — AB

## 2018-11-23 LAB — BRAIN NATRIURETIC PEPTIDE: B Natriuretic Peptide: 2445.3 pg/mL — ABNORMAL HIGH (ref 0.0–100.0)

## 2018-11-23 MED ORDER — LIVING BETTER WITH HEART FAILURE BOOK: Freq: Once | Status: DC

## 2018-11-23 MED ORDER — CHLORHEXIDINE GLUCONATE CLOTH 2 % EX PADS
6.0000 | MEDICATED_PAD | Freq: Every day | CUTANEOUS | Status: DC
  Administered 2018-11-24 – 2018-11-25 (×2): 6 via TOPICAL

## 2018-11-23 MED ORDER — APIXABAN 2.5 MG PO TABS
2.5000 mg | ORAL_TABLET | Freq: Two times a day (BID) | ORAL | Status: DC
  Administered 2018-11-23 – 2018-11-25 (×4): 2.5 mg via ORAL
  Filled 2018-11-23 (×4): qty 1

## 2018-11-23 MED ORDER — INSULIN ASPART 100 UNIT/ML ~~LOC~~ SOLN: 0.0000 [IU] | Freq: Every day | SUBCUTANEOUS | Status: DC

## 2018-11-23 MED ORDER — ACETAMINOPHEN 325 MG PO TABS: 650.0000 mg | ORAL_TABLET | Freq: Four times a day (QID) | ORAL | Status: DC | PRN

## 2018-11-23 MED ORDER — SODIUM CHLORIDE 0.9% FLUSH
3.0000 mL | Freq: Two times a day (BID) | INTRAVENOUS | Status: DC
  Administered 2018-11-23 – 2018-11-25 (×4): 3 mL via INTRAVENOUS

## 2018-11-23 MED ORDER — ONDANSETRON HCL 4 MG/2ML IJ SOLN: 4.0000 mg | Freq: Four times a day (QID) | INTRAMUSCULAR | Status: DC | PRN

## 2018-11-23 MED ORDER — MUPIROCIN 2 % EX OINT
1.0000 "application " | TOPICAL_OINTMENT | Freq: Two times a day (BID) | CUTANEOUS | Status: DC
  Administered 2018-11-23 – 2018-11-25 (×4): 1 via NASAL

## 2018-11-23 MED ORDER — MUPIROCIN 2 % EX OINT
TOPICAL_OINTMENT | CUTANEOUS | Status: AC
  Filled 2018-11-23: qty 22

## 2018-11-23 MED ORDER — FUROSEMIDE 10 MG/ML IJ SOLN
60.0000 mg | Freq: Two times a day (BID) | INTRAMUSCULAR | Status: DC
  Administered 2018-11-23 – 2018-11-24 (×2): 60 mg via INTRAVENOUS
  Filled 2018-11-23 (×2): qty 6

## 2018-11-23 MED ORDER — INSULIN ASPART 100 UNIT/ML ~~LOC~~ SOLN: 0.0000 [IU] | Freq: Three times a day (TID) | SUBCUTANEOUS | Status: DC

## 2018-11-23 MED ORDER — GABAPENTIN 100 MG PO CAPS
200.0000 mg | ORAL_CAPSULE | Freq: Three times a day (TID) | ORAL | Status: DC
  Administered 2018-11-23 – 2018-11-25 (×5): 200 mg via ORAL
  Filled 2018-11-23 (×5): qty 2

## 2018-11-23 MED ORDER — ACETAMINOPHEN 650 MG RE SUPP: 650.0000 mg | Freq: Four times a day (QID) | RECTAL | Status: DC | PRN

## 2018-11-23 MED ORDER — METOPROLOL SUCCINATE ER 100 MG PO TB24
100.0000 mg | ORAL_TABLET | Freq: Every day | ORAL | Status: DC
  Administered 2018-11-23 – 2018-11-25 (×3): 100 mg via ORAL
  Filled 2018-11-23 (×3): qty 1

## 2018-11-23 MED ORDER — ONDANSETRON HCL 4 MG PO TABS: 4.0000 mg | ORAL_TABLET | Freq: Four times a day (QID) | ORAL | Status: DC | PRN

## 2018-11-23 MED ORDER — APIXABAN 5 MG PO TABS: 5.0000 mg | ORAL_TABLET | Freq: Two times a day (BID) | ORAL | Status: DC

## 2018-11-23 MED ORDER — SODIUM CHLORIDE 0.9% FLUSH: 3.0000 mL | Freq: Once | INTRAVENOUS | Status: DC

## 2018-11-23 MED ORDER — LOSARTAN POTASSIUM 50 MG PO TABS
50.0000 mg | ORAL_TABLET | Freq: Every day | ORAL | Status: DC
  Administered 2018-11-23 – 2018-11-25 (×3): 50 mg via ORAL
  Filled 2018-11-23 (×3): qty 1

## 2018-11-23 MED ORDER — FUROSEMIDE 10 MG/ML IJ SOLN
40.0000 mg | Freq: Once | INTRAMUSCULAR | Status: AC
  Administered 2018-11-23: 40 mg via INTRAVENOUS
  Filled 2018-11-23: qty 4

## 2018-11-23 NOTE — H&P (Signed)
TRH H&P   Patient Demographics:    [name] [name], is a 85 y.o. male  MRN: [id number]   DOB - 01-13-1927  Admit Date - 11/23/2018  Outpatient Primary MD for the patient is [name], MD  Referring MD: Dr. Ashok Cordia  Outpatient Specialists: None  Patient coming from: home  Chief Complaint  Patient presents with  . Shortness of Breath      HPI:    [name] [name]  is a 85 y.o. male,  With a history of severe systolic CHF with EF of 62-94% as per recent echo in March 2020 showing diffuse hypokinesis, history of PAD with bilateral AKA, paroxysmal A. fib on Eliquis diet-controlled diabetes mellitus, chronic kidney disease stage III (baseline creatinine of 1.6-1.7), hypertension and history of gout who lives with his daughter and was brought to the ED for increasing shortness of breath of almost 1 week duration.   Patient is a very poor historian and reports that he was having breathing difficulty for past 1 week but did not tell his daughter until yesterday.  History mainly provided by his daughter on the phone who lives with him.  She informs that patient was complaining of increasing shortness of breath for past 2 days.  Patient denied any chest pain or palpitations.  She did not notice any orthopnea or PND, fevers, chills, nausea, vomiting, abdominal pain, dysuria or diarrhea.  She reports that patient has been adherent to his medications and with diet to some extent.  She and her son live with the patient and both of them deny any recent respiratory illness, fevers in the family or recent travel.  Denies any recent change in his the medication.  In the ED he was tachypneic and maintaining sats in the low 90s.  Afebrile and stable blood pressure.  Blood work showed WBC of 2.9, normal hemoglobin and platelets.  Chemistry showed creatinine of 1.6 (at baseline) and glucose of 105.  BNP markedly  elevated at 2400.  He also had elevated high-sensitivity troponin. cxr showed findings of bilateral pulmonary interstitial prominanace with left sided pleural effusion consistent with CHF.  EKG showed NSR @64  with T wavs flattening in inferior and lateral leads. Patient given 40 mg of IV Lasix in the ED and hospitalist consulted for admission to telemetry for acute on chronic systolic CHF.  COVID 19 test in the ED was negative.   Of note patient was hospitalized back in March.  Acute on chronic CHF and new diagnosis of A. fib with RVR and started on Eliquis.  He was seen by cardiology back in May for A. fib on a tele-visit.    Review of systems:    In addition to the HPI above, No Fever-chills, No Headache, No changes with Vision or hearing, No problems swallowing food or Liquids, No Chest pain, nonproductive cough and shortness of breath+++ No Abdominal pain,  No Nausea or vomiting,, Bowel movements are regular, No Blood in stool or Urine, No dysuria, No new skin rashes or bruises, No new joints pains-aches,  No new weakness, tingling, numbness in any extremity, No recent weight gain or loss, No polyuria, polydypsia or polyphagia, No significant Mental Stressors.    With Past History of the following :    Past Medical History:  Diagnosis Date  . Chronic kidney disease    RENAL INSUFICCIENCY  . Diabetes mellitus without complication ([place])   . DVT (deep venous thrombosis) ([place])   . Gout   . Hypertension   . Lymphadenitis 08/10/2012  . Psoriasis       Past Surgical History:  Procedure Laterality Date  . AMPUTATION Bilateral 10/08/2017   Procedure: Bilateral  ABOVE KNEE amputations;  Surgeon: Conrad , MD;  Location: [facility] OR;  Service: Vascular;  Laterality: Bilateral;      Social History:     Social History   Tobacco Use  . Smoking status: Former Smoker    Years: 40.00    Types: Pipe  . Smokeless tobacco: Never Used  Substance Use Topics  . Alcohol use: No      Lives -home with daughter  Mobility -wheelchair-bound     Family History :     Family History  Problem Relation Age of Onset  . Hypertension Mother   . Stroke Mother   . Hypertension Father       Home Medications:   Prior to Admission medications   Medication Sig Start Date End Date Taking? Authorizing Provider  apixaban (ELIQUIS) 5 MG TABS tablet Take 1 tablet (5 mg total) by mouth 2 (two) times daily. 10/08/18   Fulp, Cammie, MD  furosemide (LASIX) 40 MG tablet Take 1 tablet (40 mg total) by mouth daily. 10/08/18   Fulp, Cammie, MD  gabapentin (NEURONTIN) 100 MG capsule Patient takes 2 Capsule in the Morning, 2 Capsule every evening and 3 Capsules at Bedtime. 10/08/18 11/30/18  Fulp, Cammie, MD  losartan (COZAAR) 50 MG tablet Take 1 tablet (50 mg total) by mouth daily. 10/08/18   Fulp, Cammie, MD  metoprolol succinate (TOPROL-XL) 100 MG 24 hr tablet Take 1 tablet (100 mg total) by mouth daily. Take with or immediately following a meal. 10/08/18   Fulp, Cammie, MD     Allergies:    No Known Allergies   Physical Exam:   Vitals  Blood pressure (!) 155/91, pulse (!) 58, temperature 98.3 F (36.8 C), temperature source Oral, resp. rate (!) 23, SpO2 99 %.   General: Elderly male lying in bed in no acute distress.  Unable to speak in full sentence due to shortness of breath. HEENT: Pupils reactive bilaterally, EOMI, moist mucosa, supple neck, no pallor, no icterus, no cervical lymphadenopathy Chest: Bibasilar coarse crackles, no rhonchi or wheeze CVs: Normal S1-S2, no murmurs or gallop GI: Soft, nondistended, non-tender, bowel sounds present Musculoskeletal: Bilateral BKA, mild pitting edema over the thighs CNS: Alert and oriented   Data Review:    CBC Recent Labs  Lab 11/23/18 1147  WBC 2.9*  HGB 15.8  HCT 49.0  PLT 175  MCV 100.2*  MCH 32.3  MCHC 32.2  RDW 15.8*    ------------------------------------------------------------------------------------------------------------------  Chemistries  Recent Labs  Lab 11/23/18 1147  NA 142  K 4.1  CL 104  CO2 27  GLUCOSE 105*  BUN 14  CREATININE 1.60*  CALCIUM 9.6   ------------------------------------------------------------------------------------------------------------------ CrCl cannot be calculated (Unknown ideal weight.). ------------------------------------------------------------------------------------------------------------------ No results  for input(s): TSH, T4TOTAL, T3FREE, THYROIDAB in the last 72 hours.  Invalid input(s): FREET3  Coagulation profile No results for input(s): INR, PROTIME in the last 168 hours. ------------------------------------------------------------------------------------------------------------------- No results for input(s): DDIMER in the last 72 hours. -------------------------------------------------------------------------------------------------------------------  Cardiac Enzymes No results for input(s): CKMB, TROPONINI, MYOGLOBIN in the last 168 hours.  Invalid input(s): CK ------------------------------------------------------------------------------------------------------------------    Component Value Date/Time   BNP 2,445.3 (H) 11/23/2018 1538     ---------------------------------------------------------------------------------------------------------------  Urinalysis    Component Value Date/Time   COLORURINE YELLOW 10/04/2017 1413   APPEARANCEUR CLEAR 10/04/2017 1413   LABSPEC 1.010 10/04/2017 1413   PHURINE 5.0 10/04/2017 1413   GLUCOSEU NEGATIVE 10/04/2017 1413   HGBUR NEGATIVE 10/04/2017 [place] 10/04/2017 1413   KETONESUR NEGATIVE 10/04/2017 1413   PROTEINUR NEGATIVE 10/04/2017 1413   UROBILINOGEN 0.2 11/16/2012 0345   NITRITE NEGATIVE 10/04/2017 1413   LEUKOCYTESUR NEGATIVE 10/04/2017 1413     ----------------------------------------------------------------------------------------------------------------   Imaging Results:    Dg Chest 2 View  Result Date: 11/23/2018 CLINICAL DATA:  Shortness of breath. EXAM: CHEST - 2 VIEW COMPARISON:  07/15/2018. FINDINGS: Cardiomegaly. Mild bilateral pulmonary interstitial prominence and left-sided pleural effusion. Findings consistent with CHF. No acute bony abnormality. IMPRESSION: Cardiomegaly with mild bilateral pulmonary interstitial prominence and left-sided pleural effusion consistent with CHF. Electronically Signed   By: Marcello Moores  Register   On: 11/23/2018 12:29    My personal review of EKG: Normal sinus rhythm at 64, T wave flattening in inferior and lateral leads   Assessment & Plan:    Principal Problem:   Acute on chronic systolic CHF (congestive heart failure) ([place]) Has severely suppressed LVEF on recent 2D echo of 15-20% with diffuse hypokinesis. Admit to inpatient telemetry. sats in low 90s on RA. Place on 2L via [place]. Marland Kitchen  Patient on 40 mg oral Lasix daily at home.  We will place him on IV Lasix 60 mg every 12 hours.  Strict I's/O and daily weight. Resume beta-blocker and ARB.  Monitor electrolytes daily. Does not need repeat 2D echo. Consult heart failure team if unimproved.  Active Problems: Atrial flutter/fibrillation CHA2DS2 Vasc of 5. Patient was hospitalized din 06/2018 for new onset rapid afib requiring cardizem drip and was started on eliquis. Saw EP with [facility] on telephone visit in may.  currently in sinus. continue metoprolol and eliquis.   Elevated troponin No chest pain symptoms.. Likely demand ischemia with CHF. Monitor for now.    Diabetes mellitus type 2 diet controlled.A1C of 5.8  History of peripheral vascular disease -patient is status post bilateral AKA.  History of gout continue allopurinol.     DVT Prophylaxis Eliquis  AM Labs Ordered, also please review Full Orders  Family Communication:  Admission, patients condition and plan of care including tests being ordered have been discussed with the patient and daughter on the phone   Code Status  Full code ( daughter wished for full scope of treatment)  Likely DC to  home  Condition fair  Consults called: none   Admission status: inpatient Patient presenting with severe symptoms of acute on chronic decompensated systolic CHF with respiratory distress. He is at high risk of further cardia and respiratory decompensation and needs to be monitored closely as inpatient with IV diuresis for at least >2 midnight.  Time spent in minutes : 50   Creola Krotz M.D on 11/23/2018 at 5:15 PM  Between 7am to 7pm - Pager - [phone]. After 7pm go to [email or web address] - password [facility]  [facility] - Office  [phone]

## 2018-11-23 NOTE — ED Triage Notes (Signed)
Pt is a double amputee that arrives to triage in wheel chair states he has been feeling short of breath with dizziness for over 2 two weeks now but has been unable to get a ride to ED. Pt also reports a mild tightness in his chest that he also noticed over 1 week ago.

## 2018-11-23 NOTE — ED Provider Notes (Signed)
[place] EMERGENCY DEPARTMENT Provider Note   CSN: [id number] Arrival date & time: 11/23/18  1049     History   Chief Complaint Chief Complaint  Patient presents with  . Shortness of Breath    HPI [name] [name] is a 85 y.o. male.     Patient with hx chf, cm EF 15%, presents with increased sob in the past week. Symptoms gradual onset, moderate, persistent, slowly worse, worse w exertion/?when flat. ?orthopnea. No pnd. No chest pain. Denies cough or uri symptoms. No fever or chills. No known covid+ exposure. Denies change in meds, and indicates is compliant w meds.   The history is provided by the patient.  Shortness of Breath Associated symptoms: no abdominal pain, no chest pain, no cough, no fever, no headaches, no neck pain, no rash, no sore throat and no vomiting     Past Medical History:  Diagnosis Date  . Chronic kidney disease    RENAL INSUFICCIENCY  . Diabetes mellitus without complication ([place])   . DVT (deep venous thrombosis) ([place])   . Gout   . Hypertension   . Lymphadenitis 08/10/2012  . Psoriasis     Patient Active Problem List   Diagnosis Date Noted  . Acute on chronic heart failure ([place])   . Atrial fibrillation and flutter ([place])   . Atrial fibrillation with RVR ([place]) 07/15/2018  . S/P AKA (above knee amputation) bilateral ([place])   . Pressure injury of skin 10/04/2017  . Hyperkalemia 11/17/2016  . Venous stasis ulcer ([place]) 11/17/2016  . Venous stasis ulcer of lower extremity ([place]) 05/23/2015  . Cellulitis of both lower extremities 10/18/2014  . Chronic renal insufficiency, stage 3 (moderate) ([place]) 10/18/2014  . HTN (hypertension), benign 10/18/2014  . Chronic diastolic CHF (congestive heart failure) ([place]) 10/18/2014  . Lower extremity cellulitis 10/18/2014  . PAD (peripheral artery disease) ([place]) 02/02/2014  . Atherosclerosis of native arteries of the extremities with ulceration ([place]) 07/22/2013  . Acute renal failure ([place])  11/15/2012  . Fall 11/15/2012  . Diabetic ulcer of left foot ([place]) 10/15/2012  . Diabetes mellitus ([place]) 10/15/2012  . Lymphadenitis 08/07/2012  . Cellulitis 08/07/2012  . HTN (hypertension) 08/05/2012  . Renal insufficiency 08/05/2012  . Tobacco abuse 08/05/2012  . Gout 08/05/2012  . Hyperglycemia 08/05/2012    Past Surgical History:  Procedure Laterality Date  . AMPUTATION Bilateral 10/08/2017   Procedure: Bilateral  ABOVE KNEE amputations;  Surgeon: Conrad [place], MD;  Location: [facility] OR;  Service: Vascular;  Laterality: Bilateral;        Home Medications    Prior to Admission medications   Medication Sig Start Date End Date Taking? Authorizing Provider  apixaban (ELIQUIS) 5 MG TABS tablet Take 1 tablet (5 mg total) by mouth 2 (two) times daily. 10/08/18   Fulp, Cammie, MD  furosemide (LASIX) 40 MG tablet Take 1 tablet (40 mg total) by mouth daily. 10/08/18   Fulp, Cammie, MD  gabapentin (NEURONTIN) 100 MG capsule Patient takes 2 Capsule in the Morning, 2 Capsule every evening and 3 Capsules at Bedtime. 10/08/18 11/30/18  Fulp, Cammie, MD  losartan (COZAAR) 50 MG tablet Take 1 tablet (50 mg total) by mouth daily. 10/08/18   Fulp, Cammie, MD  metoprolol succinate (TOPROL-XL) 100 MG 24 hr tablet Take 1 tablet (100 mg total) by mouth daily. Take with or immediately following a meal. 10/08/18   Fulp, Cammie, MD  triamcinolone ointment (KENALOG) 0.1 % Apply 1 application topically 2 (two) times daily. To  affected skin areas as needed 10/08/18   [name], MD    Family History Family History  Problem Relation Age of Onset  . Hypertension Mother   . Stroke Mother   . Hypertension Father     Social History Social History   Tobacco Use  . Smoking status: Former Smoker    Years: 40.00    Types: Pipe  . Smokeless tobacco: Never Used  Substance Use Topics  . Alcohol use: No  . Drug use: No     Allergies   Patient has no known allergies.   Review of Systems Review of Systems   Constitutional: Negative for fever.  HENT: Negative for sore throat.   Eyes: Negative for redness.  Respiratory: Positive for shortness of breath. Negative for cough.   Cardiovascular: Negative for chest pain.  Gastrointestinal: Negative for abdominal pain and vomiting.  Endocrine: Negative for polyuria.  Genitourinary: Negative for flank pain.  Musculoskeletal: Negative for back pain and neck pain.  Skin: Negative for rash.  Neurological: Negative for headaches.  Hematological: Does not bruise/bleed easily.  Psychiatric/Behavioral: Negative for confusion.     Physical Exam Updated Vital Signs BP (!) 146/92 (BP Location: Right Arm)   Pulse (!) 56   Temp 98.3 F (36.8 C) (Oral)   Resp 14   SpO2 94%   Physical Exam Vitals signs and nursing note reviewed.  Constitutional:      Appearance: Normal appearance. He is well-developed.  HENT:     Head: Atraumatic.     Nose: Nose normal.     Mouth/Throat:     Mouth: Mucous membranes are moist.     Pharynx: Oropharynx is clear.  Eyes:     General: No scleral icterus.    Conjunctiva/sclera: Conjunctivae normal.  Neck:     Musculoskeletal: Normal range of motion and neck supple. No neck rigidity.     Trachea: No tracheal deviation.  Cardiovascular:     Rate and Rhythm: Normal rate and regular rhythm.     Pulses: Normal pulses.     Heart sounds: Normal heart sounds. No murmur. No friction rub. No gallop.   Pulmonary:     Effort: Pulmonary effort is normal. No accessory muscle usage or respiratory distress.     Breath sounds: Rales present.     Comments: Basilar rales Abdominal:     General: Bowel sounds are normal. There is no distension.     Palpations: Abdomen is soft.     Tenderness: There is no abdominal tenderness. There is no guarding.  Genitourinary:    Comments: No cva tenderness. Musculoskeletal:     Comments: Bilateral AKA.   Skin:    General: Skin is warm and dry.     Findings: No rash.  Neurological:      Mental Status: He is alert.     Comments: Alert, speech clear.   Psychiatric:        Mood and Affect: Mood normal.      ED Treatments / Results  Labs (all labs ordered are listed, but only abnormal results are displayed) Results for orders placed or performed during the hospital encounter of 11/23/18  SARS Coronavirus 2 (CEPHEID - Performed in [place] hospital lab), [facility] Order   Specimen: Nasopharyngeal Swab  Result Value Ref Range   SARS Coronavirus 2 NEGATIVE NEGATIVE  MRSA PCR Screening   Specimen: Nasal Mucosa; Nasopharyngeal  Result Value Ref Range   MRSA by PCR POSITIVE (A) NEGATIVE  Basic metabolic panel  Result  Value Ref Range   Sodium 142 135 - 145 mmol/L   Potassium 4.1 3.5 - 5.1 mmol/L   Chloride 104 98 - 111 mmol/L   CO2 27 22 - 32 mmol/L   Glucose, Bld 105 (H) 70 - 99 mg/dL   BUN 14 8 - 23 mg/dL   Creatinine, Ser 1.60 (H) 0.61 - 1.24 mg/dL   Calcium 9.6 8.9 - 10.3 mg/dL   GFR calc non Af Amer 37 (L) >60 mL/min   GFR calc Af Amer 43 (L) >60 mL/min   Anion gap 11 5 - 15  CBC  Result Value Ref Range   WBC 2.9 (L) 4.0 - 10.5 K/uL   RBC 4.89 4.22 - 5.81 MIL/uL   Hemoglobin 15.8 13.0 - 17.0 g/dL   HCT 49.0 39.0 - 52.0 %   MCV 100.2 (H) 80.0 - 100.0 fL   MCH 32.3 26.0 - 34.0 pg   MCHC 32.2 30.0 - 36.0 g/dL   RDW 15.8 (H) 11.5 - 15.5 %   Platelets 175 150 - 400 K/uL   nRBC 0.0 0.0 - 0.2 %  Brain natriuretic peptide  Result Value Ref Range   B Natriuretic Peptide 2,445.3 (H) 0.0 - 100.0 pg/mL  Basic metabolic panel  Result Value Ref Range   Sodium 141 135 - 145 mmol/L   Potassium 3.9 3.5 - 5.1 mmol/L   Chloride 103 98 - 111 mmol/L   CO2 25 22 - 32 mmol/L   Glucose, Bld 91 70 - 99 mg/dL   BUN 15 8 - 23 mg/dL   Creatinine, Ser 1.47 (H) 0.61 - 1.24 mg/dL   Calcium 9.0 8.9 - 10.3 mg/dL   GFR calc non Af Amer 41 (L) >60 mL/min   GFR calc Af Amer 48 (L) >60 mL/min   Anion gap 13 5 - 15  Glucose, capillary  Result Value Ref Range   Glucose-Capillary 140  (H) 70 - 99 mg/dL   Comment 1 Notify RN    Comment 2 Document in Chart   Glucose, capillary  Result Value Ref Range   Glucose-Capillary 106 (H) 70 - 99 mg/dL   Comment 1 Notify RN    Comment 2 Document in Chart   Glucose, capillary  Result Value Ref Range   Glucose-Capillary 84 70 - 99 mg/dL   Comment 1 Notify RN    Comment 2 Document in Chart   Troponin I (High Sensitivity)  Result Value Ref Range   Troponin I (High Sensitivity) 55 (H) <18 ng/L  Troponin I (High Sensitivity)  Result Value Ref Range   Troponin I (High Sensitivity) 59 (H) <18 ng/L   Dg Chest 2 View  Result Date: 11/23/2018 CLINICAL DATA:  Shortness of breath. EXAM: CHEST - 2 VIEW COMPARISON:  07/15/2018. FINDINGS: Cardiomegaly. Mild bilateral pulmonary interstitial prominence and left-sided pleural effusion. Findings consistent with CHF. No acute bony abnormality. IMPRESSION: Cardiomegaly with mild bilateral pulmonary interstitial prominence and left-sided pleural effusion consistent with CHF. Electronically Signed   By: Marcello Moores  Register   On: 11/23/2018 12:29    EKG None  Radiology Dg Chest 2 View  Result Date: 11/23/2018 CLINICAL DATA:  Shortness of breath. EXAM: CHEST - 2 VIEW COMPARISON:  07/15/2018. FINDINGS: Cardiomegaly. Mild bilateral pulmonary interstitial prominence and left-sided pleural effusion. Findings consistent with CHF. No acute bony abnormality. IMPRESSION: Cardiomegaly with mild bilateral pulmonary interstitial prominence and left-sided pleural effusion consistent with CHF. Electronically Signed   By: Marcello Moores  Register   On: 11/23/2018 12:29  Procedures Procedures (including critical care time)  Medications Ordered in ED Medications  sodium chloride flush (NS) 0.9 % injection 3 mL (has no administration in time range)  furosemide (LASIX) injection 40 mg (has no administration in time range)     Initial Impression / Assessment and Plan / ED Course  I have reviewed the triage vital signs  and the nursing notes.  Pertinent labs & imaging results that were available during my care of the patient were reviewed by me and considered in my medical decision making (see chart for details).  Iv ns. Continuous pulse ox and monitor. Stat labs.   Reviewed nursing notes and prior charts for additional history. Last echo, EF  15-20%.   Patient notes increased dyspnea, orthopnea.  Labs reviewed by me - hs trop is elevated. bnp pending. Lytes normal.  Chest xray reviewed by me - vascular congestion/CHF.  Lasix iv.  Medicine service consulted for admission.  COVID test added to workup.  [name] [name] was evaluated in Emergency Department on 11/24/2018 for the symptoms described in the history of present illness. He was evaluated in the context of the global COVID-19 pandemic, which necessitated consideration that the patient might be at risk for infection with the SARS-CoV-2 virus that causes COVID-19. Institutional protocols and algorithms that pertain to the evaluation of patients at risk for COVID-19 are in a state of rapid change based on information released by regulatory bodies including the CDC and federal and state organizations. These policies and algorithms were followed during the patient's care in the ED.    Final Clinical Impressions(s) / ED Diagnoses   Final diagnoses:  None    ED Discharge Orders    None       Lajean Saver, MD 11/24/18 1340

## 2018-11-23 NOTE — ED Notes (Signed)
ED TO INPATIENT HANDOFF REPORT  ED Nurse Name and Phone #: Nolene Bernheim Name/Age/Gender [name] [name] 85 y.o. male Room/Bed: 041C/041C  Code Status   Code Status: Prior  Home/SNF/Other Home Patient oriented to: self, place, time and situation Is this baseline? Yes   Triage Complete: Triage complete  Chief Complaint cp  Triage Note Pt is a double amputee that arrives to triage in wheel chair states he has been feeling short of breath with dizziness for over 2 two weeks now but has been unable to get a ride to ED. Pt also reports a mild tightness in his chest that he also noticed over 1 week ago.    Allergies No Known Allergies  Level of Care/Admitting Diagnosis ED Disposition    ED Disposition Condition [facility] Area: [place] [[id number]]  Level of Care: Telemetry Cardiac [103]  Covid Evaluation: Asymptomatic Screening Protocol (No Symptoms)  Diagnosis: Acute on chronic systolic CHF (congestive heart failure) [facility]) [[id number]]  Admitting Physician: Louellen Molder [phone]  Attending Physician: Louellen Molder [phone]  Estimated length of stay: past midnight tomorrow  Certification:: I certify this patient will need inpatient services for at least 2 midnights  PT Class (Do Not Modify): Inpatient [101]  PT Acc Code (Do Not Modify): Private [1]       B Medical/Surgery History Past Medical History:  Diagnosis Date  . Chronic kidney disease    RENAL INSUFICCIENCY  . Diabetes mellitus without complication ([place])   . DVT (deep venous thrombosis) ([place])   . Gout   . Hypertension   . Lymphadenitis 08/10/2012  . Psoriasis    Past Surgical History:  Procedure Laterality Date  . AMPUTATION Bilateral 10/08/2017   Procedure: Bilateral  ABOVE KNEE amputations;  Surgeon: Conrad Dunlap, MD;  Location: [facility] OR;  Service: Vascular;  Laterality: Bilateral;     A IV Location/Drains/Wounds Patient Lines/Drains/Airways Status   Active  Line/Drains/Airways    Name:   Placement date:   Placement time:   Site:   Days:   Peripheral IV 11/23/18 Left Antecubital   11/23/18    1612    Antecubital   less than 1   External Urinary Catheter   10/08/17    0900    -   411   Incision (Closed) 10/08/17 Leg Right   10/08/17    1540     411   Incision (Closed) 10/08/17 Leg Left   10/08/17    1540     411   Wound / Incision (Open or Dehisced) 10/18/14 Other (Comment) Sacrum   10/18/14    2304    Sacrum   1497   Wound / Incision (Open or Dehisced) 07/17/18 Coccyx Mid   07/17/18    1217    Coccyx   129          Intake/Output Last 24 hours No intake or output data in the 24 hours ending 11/23/18 1756  Labs/Imaging Results for orders placed or performed during the hospital encounter of 11/23/18 (from the past 48 hour(s))  Basic metabolic panel     Status: Abnormal   Collection Time: 11/23/18 11:47 AM  Result Value Ref Range   Sodium 142 135 - 145 mmol/L   Potassium 4.1 3.5 - 5.1 mmol/L   Chloride 104 98 - 111 mmol/L   CO2 27 22 - 32 mmol/L   Glucose, Bld 105 (H) 70 - 99 mg/dL   BUN 14 8 - 23 mg/dL  Creatinine, Ser 1.60 (H) 0.61 - 1.24 mg/dL   Calcium 9.6 8.9 - 10.3 mg/dL   GFR calc non Af Amer 37 (L) >60 mL/min   GFR calc Af Amer 43 (L) >60 mL/min   Anion gap 11 5 - 15    Comment: Performed at [facility] [address], [place], [place] [address]  CBC     Status: Abnormal   Collection Time: 11/23/18 11:47 AM  Result Value Ref Range   WBC 2.9 (L) 4.0 - 10.5 K/uL   RBC 4.89 4.22 - 5.81 MIL/uL   Hemoglobin 15.8 13.0 - 17.0 g/dL   HCT 49.0 39.0 - 52.0 %   MCV 100.2 (H) 80.0 - 100.0 fL   MCH 32.3 26.0 - 34.0 pg   MCHC 32.2 30.0 - 36.0 g/dL   RDW 15.8 (H) 11.5 - 15.5 %   Platelets 175 150 - 400 K/uL   nRBC 0.0 0.0 - 0.2 %    Comment: Performed at [facility], [place] [address], [place], [place] [address]  Troponin I (High Sensitivity)     Status: Abnormal   Collection Time: 11/23/18 11:47 AM  Result Value Ref  Range   Troponin I (High Sensitivity) 55 (H) <18 ng/L    Comment: (NOTE) Elevated high sensitivity troponin I (hsTnI) values and significant  changes across serial measurements may suggest ACS but many other  chronic and acute conditions are known to elevate hsTnI results.  Refer to the "Links" section for chest pain algorithms and additional  guidance. Performed at [facility], [place] [address] [place], [place] [address]   Troponin I (High Sensitivity)     Status: Abnormal   Collection Time: 11/23/18  1:15 PM  Result Value Ref Range   Troponin I (High Sensitivity) 59 (H) <18 ng/L    Comment: (NOTE) Elevated high sensitivity troponin I (hsTnI) values and significant  changes across serial measurements may suggest ACS but many other  chronic and acute conditions are known to elevate hsTnI results.  Refer to the "Links" section for chest pain algorithms and additional  guidance. Performed at [facility], [place] [address], [place], [place] [address]   Brain natriuretic peptide     Status: Abnormal   Collection Time: 11/23/18  3:38 PM  Result Value Ref Range   B Natriuretic Peptide 2,445.3 (H) 0.0 - 100.0 pg/mL    Comment: Performed at [facility] [address] [place], [place] [address]  SARS Coronavirus 2 (CEPHEID - Performed in [facility] hospital lab), Hosp Order     Status: None   Collection Time: 11/23/18  4:10 PM   Specimen: Nasopharyngeal Swab  Result Value Ref Range   SARS Coronavirus 2 NEGATIVE NEGATIVE    Comment: (NOTE) If result is NEGATIVE SARS-CoV-2 target nucleic acids are NOT DETECTED. The SARS-CoV-2 RNA is generally detectable in upper and lower  respiratory specimens during the acute phase of infection. The lowest  concentration of SARS-CoV-2 viral copies this assay can detect is 250  copies / mL. A negative result does not preclude SARS-CoV-2 infection  and should not be used as the sole basis for treatment or other  patient management  decisions.  A negative result may occur with  improper specimen collection / handling, submission of specimen other  than nasopharyngeal swab, presence of viral mutation(s) within the  areas targeted by this assay, and inadequate number of viral copies  (<250 copies / mL). A negative  result must be combined with clinical  observations, patient history, and epidemiological information. If result is POSITIVE SARS-CoV-2 target nucleic acids are DETECTED. The SARS-CoV-2 RNA is generally detectable in upper and lower  respiratory specimens dur ing the acute phase of infection.  Positive  results are indicative of active infection with SARS-CoV-2.  Clinical  correlation with patient history and other diagnostic information is  necessary to determine patient infection status.  Positive results do  not rule out bacterial infection or co-infection with other viruses. If result is PRESUMPTIVE POSTIVE SARS-CoV-2 nucleic acids MAY BE PRESENT.   A presumptive positive result was obtained on the submitted specimen  and confirmed on repeat testing.  While 2019 novel coronavirus  (SARS-CoV-2) nucleic acids may be present in the submitted sample  additional confirmatory testing may be necessary for epidemiological  and / or clinical management purposes  to differentiate between  SARS-CoV-2 and other Sarbecovirus currently known to infect humans.  If clinically indicated additional testing with an alternate test  methodology [phone]) is advised. The SARS-CoV-2 RNA is generally  detectable in upper and lower respiratory sp ecimens during the acute  phase of infection. The expected result is Negative. Fact Sheet for Patients:  StrictlyIdeas.no Fact Sheet for Healthcare Providers: BankingDealers.co.za This test is not yet approved or cleared by the [place] FDA and has been authorized for detection and/or diagnosis of SARS-CoV-2 by FDA under an  Emergency Use Authorization (EUA).  This EUA will remain in effect (meaning this test can be used) for the duration of the COVID-19 declaration under Section 564(b)(1) of the Act, 21 U.S.C. section 360bbb-3(b)(1), unless the authorization is terminated or revoked sooner. Performed at [facility], [place] [address] [place], [place] [address]    Dg Chest 2 View  Result Date: 11/23/2018 CLINICAL DATA:  Shortness of breath. EXAM: CHEST - 2 VIEW COMPARISON:  07/15/2018. FINDINGS: Cardiomegaly. Mild bilateral pulmonary interstitial prominence and left-sided pleural effusion. Findings consistent with CHF. No acute bony abnormality. IMPRESSION: Cardiomegaly with mild bilateral pulmonary interstitial prominence and left-sided pleural effusion consistent with CHF. Electronically Signed   By: Marcello Moores  Register   On: 11/23/2018 12:29    Pending Labs [employer] (From admission, onward)    Start     Ordered   Signed and Held  Basic metabolic panel  Daily,   R     Signed and Held   Signed and Held  Basic metabolic panel  Daily,   R     Signed and Held          Vitals/Pain Today's Vitals   11/23/18 1337 11/23/18 1620 11/23/18 1643 11/23/18 1645  BP: (!) 146/92 (!) 155/91  (!) 136/94  Pulse: (!) 56 (!) 58 82   Resp: 14 (!) 23  (!) 21  Temp:      TempSrc:      SpO2: 94% 99% 95%   PainSc:        Isolation Precautions No active isolations  Medications Medications  sodium chloride flush (NS) 0.9 % injection 3 mL (3 mLs Intravenous Not Given 11/23/18 1733)  furosemide (LASIX) injection 40 mg (40 mg Intravenous Given 11/23/18 1619)    Mobility manual wheelchair Low fall risk   Focused Assessments Cardiac Assessment Handoff:    Lab Results  Component Value Date   CKTOTAL 143 10/05/2017   CKMB 5.9 (H) 11/15/2012   TROPONINI 0.10 (HH) 07/16/2018   Lab Results  Component Value Date   DDIMER 3.39 (H) 11/16/2012  Does the Patient currently have chest pain? Yes      R Recommendations: See Admitting Provider Note  Report given to:   Additional Notes:

## 2018-11-24 ENCOUNTER — Ambulatory Visit: Payer: Medicare Other | Admitting: Vascular Surgery

## 2018-11-24 LAB — GLUCOSE, CAPILLARY
Glucose-Capillary: 84 mg/dL (ref 70–99)
Glucose-Capillary: 106 mg/dL — ABNORMAL HIGH (ref 70–99)
Glucose-Capillary: 86 mg/dL (ref 70–99)

## 2018-11-24 LAB — BASIC METABOLIC PANEL
Anion gap: 13 (ref 5–15)
BUN: 15 mg/dL (ref 8–23)
CO2: 25 mmol/L (ref 22–32)
Calcium: 9 mg/dL (ref 8.9–10.3)
Chloride: 103 mmol/L (ref 98–111)
Creatinine, Ser: 1.47 mg/dL — ABNORMAL HIGH (ref 0.61–1.24)
GFR calc Af Amer: 48 mL/min — ABNORMAL LOW (ref >60)
GFR calc non Af Amer: 41 mL/min — ABNORMAL LOW (ref >60)
Glucose, Bld: 91 mg/dL (ref 70–99)
Potassium: 3.9 mmol/L (ref 3.5–5.1)
Sodium: 141 mmol/L (ref 135–145)

## 2018-11-24 MED ORDER — HYDROCORTISONE 1 % EX CREA
TOPICAL_CREAM | Freq: Two times a day (BID) | CUTANEOUS | Status: DC
  Administered 2018-11-24 (×2): via TOPICAL
  Administered 2018-11-25: 1 via TOPICAL
  Filled 2018-11-24 (×2): qty 28

## 2018-11-24 MED ORDER — FUROSEMIDE 10 MG/ML IJ SOLN
40.0000 mg | Freq: Two times a day (BID) | INTRAMUSCULAR | Status: DC
  Administered 2018-11-24 – 2018-11-25 (×2): 40 mg via INTRAVENOUS
  Filled 2018-11-24 (×2): qty 4

## 2018-11-24 NOTE — Plan of Care (Signed)

## 2018-11-24 NOTE — Progress Notes (Signed)
PROGRESS NOTE    [name] [name]  NOB:[id number] DOB: [birth year] DOA: 11/23/2018 PCP: [name], MD   Brief Narrative:  Patient is a 85 year old male with history of severe systolic CHF with ejection fraction of 15-20%, history of peripheral artery disease with bilateral AKA, paroxysmal A. fib on Eliquis, diabetes mellitus, CKD stage III, hypertension, gout who was brought to the emergency department with complaints of increasing shortness of breath for a week.  On presentation he was found to have elevated BNP.  Chest x-ray showed bilateral pulmonary interstitial prominence with a left-sided pleural effusion consistent with CHF.  Patient was admitted for the management of CHF  exacerbation.Started on IV lasix.  Assessment & Plan:   Principal Problem:   Acute on chronic systolic CHF (congestive heart failure) (HCC) Active Problems:   PAD (peripheral artery disease) (HCC)   Pressure injury of skin   S/P AKA (above knee amputation) bilateral (HCC)   Atrial fibrillation and flutter (HCC)   Troponin level elevated   Acute on chronic systolic CHF: Presented with shortness of breath.  Elevated BNP on presentation.  Last echocardiogram done on March 2020 showed ejection fraction of 15 to 20% with diffuse hypokinesis.  Also mildly hypoxic on presentation. This morning he looks comfortable.  Denies any shortness of breath.  He had good diuresis last night.  Currently he appears euvolemic.  Will taper Lasix to 40 mg twice a day.  Continue to monitor input and output, daily weight.  Continue beta-blockers and ARB. He says he does not follow-up with cardiology.  Will check with his daughter.  A. fib/flutter:CHA2DS2 Vasc of 5.  Currently heart rate is controlled.  Continue Eliquis and metoprolol.  Dose of Eliquis has been reduced due to CKD.  Elevated troponin: Most likely secondary to congestive heart failure.  No chest pain.  No need for further evaluation  Diabetes type 2: Hemoglobin A1c 5.8.   Diet controlled.  History of peripheral vascular disease: Status post bilateral AKA  History of gout: Continue allopurinol  History of severe psoriasis :  Continue supportive care.  Outpatient follow-up         DVT prophylaxis: Eliquis Code Status: Full Family Communication: None.Will call daughter Disposition Plan: Likely home tomorrow   Consultants: None  Procedures:None  Antimicrobials:  Anti-infectives (From admission, onward)   None      Subjective:  Patient seen and examined at the bed side this morning.He looks comfortable.  Denies any shortness of breath.  He had good diuresis last night.  Currently he appears euvolemic. Objective: Vitals:   11/23/18 2321 11/24/18 0300 11/24/18 0348 11/24/18 0711  BP:   128/88 (!) 136/93  Pulse:  (!) 53  (!) 51  Resp:  (!) 27  14  Temp: 97.9 F (36.6 C)  98.1 F (36.7 C) 97.6 F (36.4 C)  TempSrc:    Oral  SpO2:  97%  99%  Weight:   84.6 kg     Intake/Output Summary (Last 24 hours) at 11/24/2018 0901 Last data filed at 11/24/2018 9476 Gross per 24 hour  Intake 243 ml  Output 2850 ml  Net -2607 ml   Filed Weights   11/23/18 1800 11/24/18 0348  Weight: 83.5 kg 84.6 kg    Examination:  General exam: Appears calm and comfortable ,Not in distress,elderly male HEENT:PERRL,Oral mucosa moist, Ear/Nose normal on gross exam,HOH Respiratory system: Bilateral equal air entry, normal vesicular breath sounds, no wheezes or crackles  Cardiovascular system: Afib No JVD, murmurs, rubs, gallops or clicks.  No pedal edema. Gastrointestinal system: Abdomen is nondistended, soft and nontender. No organomegaly or masses felt. Normal bowel sounds heard. Central nervous system: Alert and oriented. No focal neurological deficits. Extremities: B/L AKA Skin: Psoriatic plaques,scaling    Data Reviewed: I have personally reviewed following labs and imaging studies  CBC: Recent Labs  Lab 11/23/18 1147  WBC 2.9*  HGB 15.8  HCT  49.0  MCV 100.2*  PLT 270   Basic Metabolic Panel: Recent Labs  Lab 11/23/18 1147 11/24/18 0321  NA 142 141  K 4.1 3.9  CL 104 103  CO2 27 25  GLUCOSE 105* 91  BUN 14 15  CREATININE 1.60* 1.47*  CALCIUM 9.6 9.0   GFR: Estimated Creatinine Clearance: 22.3 mL/min (A) (by C-G formula based on SCr of 1.47 mg/dL (H)). Liver Function Tests: No results for input(s): AST, ALT, ALKPHOS, BILITOT, PROT, ALBUMIN in the last 168 hours. No results for input(s): LIPASE, AMYLASE in the last 168 hours. No results for input(s): AMMONIA in the last 168 hours. Coagulation Profile: No results for input(s): INR, PROTIME in the last 168 hours. Cardiac Enzymes: No results for input(s): CKTOTAL, CKMB, CKMBINDEX, TROPONINI in the last 168 hours. BNP (last 3 results) No results for input(s): PROBNP in the last 8760 hours. HbA1C: No results for input(s): HGBA1C in the last 72 hours. CBG: Recent Labs  Lab 11/23/18 2111  GLUCAP 140*   Lipid Profile: No results for input(s): CHOL, HDL, LDLCALC, TRIG, CHOLHDL, LDLDIRECT in the last 72 hours. Thyroid Function Tests: No results for input(s): TSH, T4TOTAL, FREET4, T3FREE, THYROIDAB in the last 72 hours. Anemia Panel: No results for input(s): VITAMINB12, FOLATE, FERRITIN, TIBC, IRON, RETICCTPCT in the last 72 hours. Sepsis Labs: No results for input(s): PROCALCITON, LATICACIDVEN in the last 168 hours.  Recent Results (from the past 240 hour(s))  SARS Coronavirus 2 (CEPHEID - Performed in [facility] hospital lab), Hosp Order     Status: None   Collection Time: 11/23/18  4:10 PM   Specimen: Nasopharyngeal Swab  Result Value Ref Range Status   SARS Coronavirus 2 NEGATIVE NEGATIVE Final    Comment: (NOTE) If result is NEGATIVE SARS-CoV-2 target nucleic acids are NOT DETECTED. The SARS-CoV-2 RNA is generally detectable in upper and lower  respiratory specimens during the acute phase of infection. The lowest  concentration of SARS-CoV-2 viral copies  this assay can detect is 250  copies / mL. A negative result does not preclude SARS-CoV-2 infection  and should not be used as the sole basis for treatment or other  patient management decisions.  A negative result may occur with  improper specimen collection / handling, submission of specimen other  than nasopharyngeal swab, presence of viral mutation(s) within the  areas targeted by this assay, and inadequate number of viral copies  (<250 copies / mL). A negative result must be combined with clinical  observations, patient history, and epidemiological information. If result is POSITIVE SARS-CoV-2 target nucleic acids are DETECTED. The SARS-CoV-2 RNA is generally detectable in upper and lower  respiratory specimens dur ing the acute phase of infection.  Positive  results are indicative of active infection with SARS-CoV-2.  Clinical  correlation with patient history and other diagnostic information is  necessary to determine patient infection status.  Positive results do  not rule out bacterial infection or co-infection with other viruses. If result is PRESUMPTIVE POSTIVE SARS-CoV-2 nucleic acids MAY BE PRESENT.   A presumptive positive result was obtained on the submitted specimen  and  confirmed on repeat testing.  While 2019 novel coronavirus  (SARS-CoV-2) nucleic acids may be present in the submitted sample  additional confirmatory testing may be necessary for epidemiological  and / or clinical management purposes  to differentiate between  SARS-CoV-2 and other Sarbecovirus currently known to infect humans.  If clinically indicated additional testing with an alternate test  methodology [phone]) is advised. The SARS-CoV-2 RNA is generally  detectable in upper and lower respiratory sp ecimens during the acute  phase of infection. The expected result is Negative. Fact Sheet for Patients:  StrictlyIdeas.no Fact Sheet for Healthcare Providers:  BankingDealers.co.za This test is not yet approved or cleared by the [place] FDA and has been authorized for detection and/or diagnosis of SARS-CoV-2 by FDA under an Emergency Use Authorization (EUA).  This EUA will remain in effect (meaning this test can be used) for the duration of the COVID-19 declaration under Section 564(b)(1) of the Act, 21 U.S.C. section 360bbb-3(b)(1), unless the authorization is terminated or revoked sooner. Performed at [facility], [place] [address] [place], [place] [address]   MRSA PCR Screening     Status: Abnormal   Collection Time: 11/23/18  6:29 PM   Specimen: Nasal Mucosa; Nasopharyngeal  Result Value Ref Range Status   MRSA by PCR POSITIVE (A) NEGATIVE Final    Comment:        The GeneXpert MRSA Assay (FDA approved for NASAL specimens only), is one component of a comprehensive MRSA colonization surveillance program. It is not intended to diagnose MRSA infection nor to guide or monitor treatment for MRSA infections. RESULT CALLED TO, READ BACK BY AND VERIFIED WITH: RN S NEWCOMER @2115  11/23/18 BY S GEZAHEGN Performed at [facility] [address] [place], [place] [address]          Radiology Studies: Dg Chest 2 View  Result Date: 11/23/2018 CLINICAL DATA:  Shortness of breath. EXAM: CHEST - 2 VIEW COMPARISON:  07/15/2018. FINDINGS: Cardiomegaly. Mild bilateral pulmonary interstitial prominence and left-sided pleural effusion. Findings consistent with CHF. No acute bony abnormality. IMPRESSION: Cardiomegaly with mild bilateral pulmonary interstitial prominence and left-sided pleural effusion consistent with CHF. Electronically Signed   By: Marcello Moores  Register   On: 11/23/2018 12:29        Scheduled Meds: . apixaban  2.5 mg Oral BID  . Chlorhexidine Gluconate Cloth  6 each Topical Q0600  . furosemide  40 mg Intravenous Q12H  . gabapentin  200 mg Oral TID  . insulin aspart  0-5 Units Subcutaneous  QHS  . insulin aspart  0-9 Units Subcutaneous TID WC  . Living Better with Heart Failure Book   Does not apply Once  . losartan  50 mg Oral Daily  . metoprolol succinate  100 mg Oral Daily  . mupirocin ointment  1 application Nasal BID  . mupirocin ointment      . sodium chloride flush  3 mL Intravenous Once  . sodium chloride flush  3 mL Intravenous Q12H   Continuous Infusions:   LOS: 1 day    Time spent: 35 mins.More than 50% of that time was spent in counseling and/or coordination of care.      Shelly Coss, MD [facility] Pager [phone]  If 7PM-7AM, please contact night-coverage [email or web address] Password [email or web address] 11/24/2018, 9:01 AM

## 2018-11-25 LAB — BASIC METABOLIC PANEL
Anion gap: 13 (ref 5–15)
BUN: 14 mg/dL (ref 8–23)
CO2: 28 mmol/L (ref 22–32)
Calcium: 8.8 mg/dL — ABNORMAL LOW (ref 8.9–10.3)
Chloride: 98 mmol/L (ref 98–111)
Creatinine, Ser: 1.49 mg/dL — ABNORMAL HIGH (ref 0.61–1.24)
GFR calc Af Amer: 47 mL/min — ABNORMAL LOW (ref >60)
GFR calc non Af Amer: 41 mL/min — ABNORMAL LOW (ref >60)
Glucose, Bld: 90 mg/dL (ref 70–99)
Potassium: 3.5 mmol/L (ref 3.5–5.1)
Sodium: 139 mmol/L (ref 135–145)

## 2018-11-25 LAB — CBC WITH DIFFERENTIAL/PLATELET
Abs Immature Granulocytes: 0.01 10*3/uL (ref 0.00–0.07)
Basophils Absolute: 0 10*3/uL (ref 0.0–0.1)
Basophils Relative: 1 %
Eosinophils Absolute: 0.1 10*3/uL (ref 0.0–0.5)
Eosinophils Relative: 2 %
HCT: 45.9 % (ref 39.0–52.0)
Hemoglobin: 15.5 g/dL (ref 13.0–17.0)
Immature Granulocytes: 0 %
Lymphocytes Relative: 28 %
Lymphs Abs: 0.8 10*3/uL (ref 0.7–4.0)
MCH: 32.8 pg (ref 26.0–34.0)
MCHC: 33.8 g/dL (ref 30.0–36.0)
MCV: 97 fL (ref 80.0–100.0)
Monocytes Absolute: 0.4 10*3/uL (ref 0.1–1.0)
Monocytes Relative: 14 %
Neutro Abs: 1.6 10*3/uL — ABNORMAL LOW (ref 1.7–7.7)
Neutrophils Relative %: 55 %
Platelets: 169 10*3/uL (ref 150–400)
RBC: 4.73 MIL/uL (ref 4.22–5.81)
RDW: 15.1 % (ref 11.5–15.5)
WBC: 2.9 10*3/uL — ABNORMAL LOW (ref 4.0–10.5)
nRBC: 0 % (ref 0.0–0.2)

## 2018-11-25 LAB — GLUCOSE, CAPILLARY: Glucose-Capillary: 62 mg/dL — ABNORMAL LOW (ref 70–99)

## 2018-11-25 MED ORDER — HYDROCORTISONE 1 % EX CREA: TOPICAL_CREAM | Freq: Two times a day (BID) | CUTANEOUS | 1 refills | Status: AC

## 2018-11-25 MED ORDER — POTASSIUM CHLORIDE ER 20 MEQ PO TBCR: 20.0000 meq | EXTENDED_RELEASE_TABLET | Freq: Every day | ORAL | 0 refills | Status: DC

## 2018-11-25 NOTE — Progress Notes (Signed)
Inpatient Diabetes Program Recommendations  AACE/ADA: New Consensus Statement on Inpatient Glycemic Control (2015)  Target Ranges:  Prepandial:   less than 140 mg/dL      Peak postprandial:   less than 180 mg/dL (1-2 hours)      Critically ill patients:  140 - 180 mg/dL   Lab Results  Component Value Date   GLUCAP 62 (L) 11/25/2018   HGBA1C 5.8 (H) 10/25/2018    Review of Glycemic Control Results for [name] [name], [name] [name] (MRN [id number]) as of 11/25/2018 11:22  Ref. Range 11/24/2018 12:00 11/24/2018 21:13 11/25/2018 05:54  Glucose-Capillary Latest Ref Range: 70 - 99 mg/dL 106 (H) 86 62 (L)   Diabetes history: Type 2 DM Outpatient Diabetes medications: none- diet controlled Current orders for Inpatient glycemic control: Novolog 0-9 units TID, Novolog 0-5 units QHS  Inpatient Diabetes Program Recommendations:    Noted mild hypoglycemia this AM of 62 mg/dL. No insulin given this admission. Given glucose trends, patient doesn't appear to need insulin correction. Consider discontinuation of Novolog correction.   Thanks, Bronson Curb, MSN, RNC-OB Diabetes Coordinator [phone] (8a-5p)

## 2018-11-25 NOTE — Discharge Instructions (Signed)
Heart Failure, Diagnosis ° °Heart failure means that your heart is not able to pump blood in the right way. This makes it hard for your body to work well. Heart failure is usually a long-term (chronic) condition. You must take good care of yourself and follow your treatment plan from your doctor. °What are the causes? °This condition may be caused by: °· High blood pressure. °· Build up of cholesterol and fat in the arteries. °· Heart attack. This injures the heart muscle. °· Heart valves that do not open and close properly. °· Damage of the heart muscle. This is also called cardiomyopathy. °· Lung disease. °· Abnormal heart rhythms. °What increases the risk? °The risk of heart failure goes up as a person ages. This condition is also more likely to develop in people who: °· Are overweight. °· Are male. °· Smoke or chew tobacco. °· Abuse alcohol or illegal drugs. °· Have taken medicines that can damage the heart. °· Have diabetes. °· Have abnormal heart rhythms. °· Have thyroid problems. °· Have low blood counts (anemia). °What are the signs or symptoms? °Symptoms of this condition include: °· Shortness of breath. °· Coughing. °· Swelling of the feet, ankles, legs, or belly. °· Losing weight for no reason. °· Trouble breathing. °· Waking from sleep because of the need to sit up and get more air. °· Rapid heartbeat. °· Being very tired. °· Feeling dizzy, or feeling like you may pass out (faint). °· Having no desire to eat. °· Feeling like you may vomit (nauseous). °· Peeing (urinating) more at night. °· Feeling confused. °How is this treated? ° °  ° °This condition may be treated with: °· Medicines. These can be given to treat blood pressure and to make the heart muscles stronger. °· Changes in your daily life. These may include eating a healthy diet, staying at a healthy body weight, quitting tobacco and illegal drug use, or doing exercises. °· Surgery. Surgery can be done to open blocked valves, or to put devices in  the heart, such as pacemakers. °· A donor heart (heart transplant). You will receive a healthy heart from a donor. °Follow these instructions at home: °· Treat other conditions as told by your doctor. These may include high blood pressure, diabetes, thyroid disease, or abnormal heart rhythms. °· Learn as much as you can about heart failure. °· Get support as you need it. °· Keep all follow-up visits as told by your doctor. This is important. °Summary °· Heart failure means that your heart is not able to pump blood in the right way. °· This condition is caused by high blood pressure, heart attack, or damage of the heart muscle. °· Symptoms of this condition include shortness of breath and swelling of the feet, ankles, legs, or belly. You may also feel very tired or feel like you may vomit. °· You may be treated with medicines, surgery, or changes in your daily life. °· Treat other health conditions as told by your doctor. °This information is not intended to replace advice given to you by your health care provider. Make sure you discuss any questions you have with your health care provider. °Document Released: 01/22/2008 Document Revised: 07/02/2018 Document Reviewed: 07/02/2018 °Elsevier Patient Education © 2020 [employer] Inc. ° °

## 2018-11-25 NOTE — Plan of Care (Signed)
  Problem: Education: Goal: Ability to demonstrate management of disease process will improve Outcome: Completed/Met Goal: Ability to verbalize understanding of medication therapies will improve Outcome: Completed/Met Goal: Individualized Educational Video(s) Outcome: Completed/Met   Problem: Activity: Goal: Capacity to carry out activities will improve Outcome: Completed/Met   Problem: Cardiac: Goal: Ability to achieve and maintain adequate cardiopulmonary perfusion will improve Outcome: Completed/Met   Problem: Education: Goal: Knowledge of General Education information will improve Description: Including pain rating scale, medication(s)/side effects and non-pharmacologic comfort measures Outcome: Completed/Met   Problem: Health Behavior/Discharge Planning: Goal: Ability to manage health-related needs will improve Outcome: Completed/Met   Problem: Clinical Measurements: Goal: Ability to maintain clinical measurements within normal limits will improve Outcome: Completed/Met Goal: Will remain free from infection Outcome: Completed/Met Goal: Diagnostic test results will improve Outcome: Completed/Met Goal: Respiratory complications will improve Outcome: Completed/Met Goal: Cardiovascular complication will be avoided Outcome: Completed/Met   Problem: Activity: Goal: Risk for activity intolerance will decrease Outcome: Completed/Met   Problem: Nutrition: Goal: Adequate nutrition will be maintained Outcome: Completed/Met   Problem: Coping: Goal: Level of anxiety will decrease Outcome: Completed/Met   Problem: Elimination: Goal: Will not experience complications related to bowel motility Outcome: Completed/Met Goal: Will not experience complications related to urinary retention Outcome: Completed/Met   Problem: Pain Managment: Goal: General experience of comfort will improve Outcome: Completed/Met   Problem: Safety: Goal: Ability to remain free from injury will  improve Outcome: Completed/Met   Problem: Skin Integrity: Goal: Risk for impaired skin integrity will decrease Outcome: Completed/Met

## 2018-11-25 NOTE — Progress Notes (Signed)
Discharge instructions discussed and explained to pt. F/u appt made. Prescriptions sent to [facility], waiting for scat bus to pick him up between 2-230p.

## 2018-11-25 NOTE — Discharge Summary (Signed)
Physician Discharge Summary  Deyton Ellenbecker VHQ:[id number] DOB: 08-19-1926 DOA: 11/23/2018  PCP: [name], MD  Admit date: 11/23/2018 Discharge date: 11/25/2018  Admitted From: Home Disposition:  Home  Discharge Condition:Stable CODE STATUS:FULL Diet recommendation: Heart Healthy   Brief/Interim Summary: Patient is a 85 year old male with history of severe systolic CHF with ejection fraction of 15-20%, history of peripheral artery disease with bilateral AKA, paroxysmal A. fib on Eliquis, diabetes mellitus, CKD stage III, hypertension, gout who was brought to the emergency department with complaints of increasing shortness of breath for a week.  On presentation he was found to have elevated BNP.  Chest x-ray showed bilateral pulmonary interstitial prominence with a left-sided pleural effusion consistent with CHF.  Patient was admitted for the management of CHF  exacerbation.Started on IV lasix.  He had net output of nearly 7 L during this admission.  Currently he is nearly euvolemic.  He is hemodynamically stable for discharge to home today  Following problems were addressed during his hospitalization:  Acute on chronic systolic CHF: Presented with shortness of breath.  Elevated BNP on presentation.  Last echocardiogram done on March 2020 showed ejection fraction of 15 to 20% with diffuse hypokinesis.  Also mildly hypoxic on presentation. This morning he looks comfortable.  Denies any shortness of breath.  He had good diuresis last night.  Currently he appears euvolemic. Continue beta-blockers and ARB.Continue lasix at home dose. He needs to follow up with his cardiologist, Dr. Oval Linsey, as an outpatient.  A. fib/flutter:CHA2DS2 Vasc of 5.  Currently heart rate is controlled.  Continue Eliquis and metoprolol.    Elevated troponin: Most likely secondary to congestive heart failure.  No chest pain.  No need for further evaluation  Diabetes type 2: Hemoglobin A1c 5.8.  Diet  controlled.  History of peripheral vascular disease: Status post bilateral AKA  History of gout: Continue allopurinol  History of severe psoriasis :  Continue supportive care.  Outpatient follow-up   Discharge Diagnoses:  Principal Problem:   Acute on chronic systolic CHF (congestive heart failure) (HCC) Active Problems:   PAD (peripheral artery disease) (HCC)   Pressure injury of skin   S/P AKA (above knee amputation) bilateral (HCC)   Atrial fibrillation and flutter (HCC)   Troponin level elevated    Discharge Instructions  Discharge Instructions    Diet - low sodium heart healthy   Complete by: As directed    Discharge instructions   Complete by: As directed    1)Continue your home medicines including lasix 40 mg daily. 2)Take prescribed medications as instructed. 3)Follow up with your PCP in a week. Do a BMP test during the follow up. 4)Follow up with your cardiologist in 2 weeks. 5)Restrict water intake to less than 1.5 litres a day and salt intake to less than 2 gm a day.Monitor your weight.   Increase activity slowly   Complete by: As directed      Allergies as of 11/25/2018   No Known Allergies     Medication List    TAKE these medications   apixaban 5 MG Tabs tablet Commonly known as: ELIQUIS Take 1 tablet (5 mg total) by mouth 2 (two) times daily.   furosemide 40 MG tablet Commonly known as: LASIX Take 1 tablet (40 mg total) by mouth daily.   gabapentin 100 MG capsule Commonly known as: NEURONTIN Patient takes 2 Capsule in the Morning, 2 Capsule every evening and 3 Capsules at Bedtime. What changed:   how much to take  how  to take this  when to take this  additional instructions   hydrocortisone cream 1 % Apply topically 2 (two) times daily.   losartan 50 MG tablet Commonly known as: COZAAR Take 1 tablet (50 mg total) by mouth daily.   metoprolol succinate 100 MG 24 hr tablet Commonly known as: TOPROL-XL Take 1 tablet (100 mg  total) by mouth daily. Take with or immediately following a meal.   Potassium Chloride ER 20 MEQ Tbcr Take 20 mEq by mouth daily.      Follow-up Information    Fulp, Cammie, MD. Schedule an appointment as soon as possible for a visit in 1 week(s).   Specialty: Family Medicine Contact information: [place] [address] [phone]        Skeet Latch, MD. Schedule an appointment as soon as possible for a visit in 2 week(s).   Specialty: Cardiology Contact information: [address] [place] [address] [phone]          No Known Allergies  Consultations: None   Procedures/Studies: Dg Chest 2 View  Result Date: 11/23/2018 CLINICAL DATA:  Shortness of breath. EXAM: CHEST - 2 VIEW COMPARISON:  07/15/2018. FINDINGS: Cardiomegaly. Mild bilateral pulmonary interstitial prominence and left-sided pleural effusion. Findings consistent with CHF. No acute bony abnormality. IMPRESSION: Cardiomegaly with mild bilateral pulmonary interstitial prominence and left-sided pleural effusion consistent with CHF. Electronically Signed   By: Marcello Moores  Register   On: 11/23/2018 12:29       Subjective:  Patient seen and examined the bedside this morning.  Hemodynamically stable.  Comfortable.  Denies any shortness of breath or cough.  Stable for discharge.  Discharge Exam: Vitals:   11/25/18 0324 11/25/18 0715  BP: 123/73   Pulse: (!) 51   Resp: 16   Temp: (!) 97.5 F (36.4 C) (!) 97.5 F (36.4 C)  SpO2: 96%    Vitals:   11/25/18 0026 11/25/18 0324 11/25/18 0546 11/25/18 0715  BP: 108/67 123/73    Pulse: (!) 51 (!) 51    Resp: 12 16    Temp: 98.1 F (36.7 C) (!) 97.5 F (36.4 C)  (!) 97.5 F (36.4 C)  TempSrc: Oral Oral  Oral  SpO2: 94% 96%    Weight:   76.8 kg     General: Pt is alert, awake, not in acute distress Cardiovascular: RRR, S1/S2 +, no rubs, no gallops Respiratory: CTA bilaterally, no wheezing, no  rhonchi Abdominal: Soft, NT, ND, bowel sounds + Extremities: no edema, no cyanosis    The results of significant diagnostics from this hospitalization (including imaging, microbiology, ancillary and laboratory) are listed below for reference.     Microbiology: Recent Results (from the past 240 hour(s))  SARS Coronavirus 2 (CEPHEID - Performed in [place] hospital lab), Hosp Order     Status: None   Collection Time: 11/23/18  4:10 PM   Specimen: Nasopharyngeal Swab  Result Value Ref Range Status   SARS Coronavirus 2 NEGATIVE NEGATIVE Final    Comment: (NOTE) If result is NEGATIVE SARS-CoV-2 target nucleic acids are NOT DETECTED. The SARS-CoV-2 RNA is generally detectable in upper and lower  respiratory specimens during the acute phase of infection. The lowest  concentration of SARS-CoV-2 viral copies this assay can detect is 250  copies / mL. A negative result does not preclude SARS-CoV-2 infection  and should not be used as the sole basis for treatment or other  patient management decisions.  A negative result may occur with  improper specimen collection / handling, submission of specimen other  than nasopharyngeal swab, presence of viral mutation(s) within the  areas targeted by this assay, and inadequate number of viral copies  (<250 copies / mL). A negative result must be combined with clinical  observations, patient history, and epidemiological information. If result is POSITIVE SARS-CoV-2 target nucleic acids are DETECTED. The SARS-CoV-2 RNA is generally detectable in upper and lower  respiratory specimens dur ing the acute phase of infection.  Positive  results are indicative of active infection with SARS-CoV-2.  Clinical  correlation with patient history and other diagnostic information is  necessary to determine patient infection status.  Positive results do  not rule out bacterial infection or co-infection with other viruses. If result is PRESUMPTIVE  POSTIVE SARS-CoV-2 nucleic acids MAY BE PRESENT.   A presumptive positive result was obtained on the submitted specimen  and confirmed on repeat testing.  While 2019 novel coronavirus  (SARS-CoV-2) nucleic acids may be present in the submitted sample  additional confirmatory testing may be necessary for epidemiological  and / or clinical management purposes  to differentiate between  SARS-CoV-2 and other Sarbecovirus currently known to infect humans.  If clinically indicated additional testing with an alternate test  methodology [phone]) is advised. The SARS-CoV-2 RNA is generally  detectable in upper and lower respiratory sp ecimens during the acute  phase of infection. The expected result is Negative. Fact Sheet for Patients:  StrictlyIdeas.no Fact Sheet for Healthcare Providers: BankingDealers.co.za This test is not yet approved or cleared by the [place] FDA and has been authorized for detection and/or diagnosis of SARS-CoV-2 by FDA under an Emergency Use Authorization (EUA).  This EUA will remain in effect (meaning this test can be used) for the duration of the COVID-19 declaration under Section 564(b)(1) of the Act, 21 U.S.C. section 360bbb-3(b)(1), unless the authorization is terminated or revoked sooner. Performed at [facility], [place] [address] [place], [place] [address]   MRSA PCR Screening     Status: Abnormal   Collection Time: 11/23/18  6:29 PM   Specimen: Nasal Mucosa; Nasopharyngeal  Result Value Ref Range Status   MRSA by PCR POSITIVE (A) NEGATIVE Final    Comment:        The GeneXpert MRSA Assay (FDA approved for NASAL specimens only), is one component of a comprehensive MRSA colonization surveillance program. It is not intended to diagnose MRSA infection nor to guide or monitor treatment for MRSA infections. RESULT CALLED TO, READ BACK BY AND VERIFIED WITH: RN S NEWCOMER @2115  11/23/18 BY S  GEZAHEGN Performed at [facility], [place] [address], [place], [place] [address]      Labs: BNP (last 3 results) Recent Labs    07/15/18 1533 11/23/18 1538  BNP 1,151.4* 7,035.0*   Basic Metabolic Panel: Recent Labs  Lab 11/23/18 1147 11/24/18 0321 11/25/18 0224  NA 142 141 139  K 4.1 3.9 3.5  CL 104 103 98  CO2 27 25 28   GLUCOSE 105* 91 90  BUN 14 15 14   CREATININE 1.60* 1.47* 1.49*  CALCIUM 9.6 9.0 8.8*   Liver Function Tests: No results for input(s): AST, ALT, ALKPHOS, BILITOT, PROT, ALBUMIN in the last 168 hours. No results for input(s): LIPASE, AMYLASE in the last 168 hours. No results for input(s): AMMONIA in the last 168 hours. CBC: Recent Labs  Lab 11/23/18 1147 11/25/18 0224  WBC 2.9* 2.9*  NEUTROABS  --  1.6*  HGB 15.8 15.5  HCT  49.0 45.9  MCV 100.2* 97.0  PLT 175 169   Cardiac Enzymes: No results for input(s): CKTOTAL, CKMB, CKMBINDEX, TROPONINI in the last 168 hours. BNP: Invalid input(s): POCBNP CBG: Recent Labs  Lab 11/23/18 2111 11/24/18 0558 11/24/18 1200 11/24/18 2113 11/25/18 0554  GLUCAP 140* 84 106* 86 62*   D-Dimer No results for input(s): DDIMER in the last 72 hours. Hgb A1c No results for input(s): HGBA1C in the last 72 hours. Lipid Profile No results for input(s): CHOL, HDL, LDLCALC, TRIG, CHOLHDL, LDLDIRECT in the last 72 hours. Thyroid function studies No results for input(s): TSH, T4TOTAL, T3FREE, THYROIDAB in the last 72 hours.  Invalid input(s): FREET3 Anemia work up No results for input(s): VITAMINB12, FOLATE, FERRITIN, TIBC, IRON, RETICCTPCT in the last 72 hours. Urinalysis    Component Value Date/Time   COLORURINE YELLOW 10/04/2017 [place] 10/04/2017 1413   LABSPEC 1.010 10/04/2017 1413   PHURINE 5.0 10/04/2017 1413   GLUCOSEU NEGATIVE 10/04/2017 1413   HGBUR NEGATIVE 10/04/2017 1413   BILIRUBINUR NEGATIVE 10/04/2017 1413   KETONESUR NEGATIVE 10/04/2017 1413   PROTEINUR NEGATIVE  10/04/2017 1413   UROBILINOGEN 0.2 11/16/2012 0345   NITRITE NEGATIVE 10/04/2017 1413   LEUKOCYTESUR NEGATIVE 10/04/2017 1413   Sepsis Labs Invalid input(s): PROCALCITONIN,  WBC,  LACTICIDVEN Microbiology Recent Results (from the past 240 hour(s))  SARS Coronavirus 2 (CEPHEID - Performed in [place] hospital lab), Hosp Order     Status: None   Collection Time: 11/23/18  4:10 PM   Specimen: Nasopharyngeal Swab  Result Value Ref Range Status   SARS Coronavirus 2 NEGATIVE NEGATIVE Final    Comment: (NOTE) If result is NEGATIVE SARS-CoV-2 target nucleic acids are NOT DETECTED. The SARS-CoV-2 RNA is generally detectable in upper and lower  respiratory specimens during the acute phase of infection. The lowest  concentration of SARS-CoV-2 viral copies this assay can detect is 250  copies / mL. A negative result does not preclude SARS-CoV-2 infection  and should not be used as the sole basis for treatment or other  patient management decisions.  A negative result may occur with  improper specimen collection / handling, submission of specimen other  than nasopharyngeal swab, presence of viral mutation(s) within the  areas targeted by this assay, and inadequate number of viral copies  (<250 copies / mL). A negative result must be combined with clinical  observations, patient history, and epidemiological information. If result is POSITIVE SARS-CoV-2 target nucleic acids are DETECTED. The SARS-CoV-2 RNA is generally detectable in upper and lower  respiratory specimens dur ing the acute phase of infection.  Positive  results are indicative of active infection with SARS-CoV-2.  Clinical  correlation with patient history and other diagnostic information is  necessary to determine patient infection status.  Positive results do  not rule out bacterial infection or co-infection with other viruses. If result is PRESUMPTIVE POSTIVE SARS-CoV-2 nucleic acids MAY BE PRESENT.   A presumptive  positive result was obtained on the submitted specimen  and confirmed on repeat testing.  While 2019 novel coronavirus  (SARS-CoV-2) nucleic acids may be present in the submitted sample  additional confirmatory testing may be necessary for epidemiological  and / or clinical management purposes  to differentiate between  SARS-CoV-2 and other Sarbecovirus currently known to infect humans.  If clinically indicated additional testing with an alternate test  methodology [phone]) is advised. The SARS-CoV-2 RNA is generally  detectable in upper and lower respiratory sp ecimens during the acute  phase of infection. The expected result is Negative. Fact Sheet for Patients:  StrictlyIdeas.no Fact Sheet for Healthcare Providers: BankingDealers.co.za This test is not yet approved or cleared by the [place] FDA and has been authorized for detection and/or diagnosis of SARS-CoV-2 by FDA under an Emergency Use Authorization (EUA).  This EUA will remain in effect (meaning this test can be used) for the duration of the COVID-19 declaration under Section 564(b)(1) of the Act, 21 U.S.C. section 360bbb-3(b)(1), unless the authorization is terminated or revoked sooner. Performed at [facility], [place] [address], [place], [place] [address]   MRSA PCR Screening     Status: Abnormal   Collection Time: 11/23/18  6:29 PM   Specimen: Nasal Mucosa; Nasopharyngeal  Result Value Ref Range Status   MRSA by PCR POSITIVE (A) NEGATIVE Final    Comment:        The GeneXpert MRSA Assay (FDA approved for NASAL specimens only), is one component of a comprehensive MRSA colonization surveillance program. It is not intended to diagnose MRSA infection nor to guide or monitor treatment for MRSA infections. RESULT CALLED TO, READ BACK BY AND VERIFIED WITH: RN S NEWCOMER @2115  11/23/18 BY S GEZAHEGN Performed at [facility], [place] [address]  [place], [place] [address]     Please note: You were cared for by a hospitalist during your hospital stay. Once you are discharged, your primary care physician will handle any further medical issues. Please note that NO REFILLS for any discharge medications will be authorized once you are discharged, as it is imperative that you return to your primary care physician (or establish a relationship with a primary care physician if you do not have one) for your post hospital discharge needs so that they can reassess your need for medications and monitor your lab values.    Time coordinating discharge: 40 minutes  SIGNED:   Shelly Coss, MD  [facility] 11/25/2018, 8:27 AM Pager [phone]  If 7PM-7AM, please contact night-coverage [email or web address] Password [email or web address]

## 2018-11-25 NOTE — TOC Transition Note (Signed)
Transition of Care [facility]) - CM/SW Discharge Note   Patient Details  Name: [name] [name] MRN: [id number] Date of Birth: January 05, 1927  Transition of Care [facility]) CM/SW Contact:  Zenon Mayo, RN Phone Number: 11/25/2018, 1:22 PM   Clinical Narrative:    Patient for dc home today, NCM offered choice for [facility] , he states I will need to contact his daughter because he lives with her.  NCM called daughter but could not get an answer,  NCM informed patient and he states he can not tell me to do anything with [facility] because he does not want to cause any disagreements with anyone.  NCM  Could not set up [facility] .The scat bus will be her to pick him up at 2 pm today.     Final next level of care: Home/Self Care Barriers to Discharge: No Barriers Identified   Patient Goals and CMS Choice Patient states their goals for this hospitalization and ongoing recovery are:: go home CMS Medicare.gov Compare Post Acute Care list provided to:: Patient Choice offered to / list presented to : Patient  Discharge Placement                       Discharge Plan and Services                DME Arranged: (NA)         HH Arranged: NA          Social Determinants of Health (SDOH) Interventions     Readmission Risk Interventions No flowsheet data found.

## 2018-12-09 ENCOUNTER — Encounter: Payer: Medicare Other | Admitting: Family Medicine

## 2018-12-13 ENCOUNTER — Ambulatory Visit: Payer: Medicare Other | Admitting: Adult Health

## 2018-12-14 ENCOUNTER — Ambulatory Visit (HOSPITAL_COMMUNITY)
Admission: RE | Admit: 2018-12-14 | Discharge: 2018-12-14 | Disposition: A | Discharge status: Home / Self Care | Payer: Medicare Other | Source: Ambulatory Visit | Attending: Physician Assistant | Admitting: Physician Assistant

## 2018-12-16 ENCOUNTER — Ambulatory Visit (HOSPITAL_COMMUNITY)
Admission: RE | Admit: 2018-12-16 | Discharge: 2018-12-16 | Disposition: A | Discharge status: Home / Self Care | Payer: Medicare Other | Source: Ambulatory Visit | Attending: Physician Assistant | Admitting: Physician Assistant

## 2018-12-16 ENCOUNTER — Other Ambulatory Visit: Payer: Self-pay

## 2018-12-16 DIAGNOSIS — I5022 Chronic systolic (congestive) heart failure: Secondary | ICD-10-CM | POA: Insufficient documentation

## 2018-12-16 DIAGNOSIS — I77810 Thoracic aortic ectasia: Secondary | ICD-10-CM | POA: Diagnosis not present

## 2018-12-16 DIAGNOSIS — F17200 Nicotine dependence, unspecified, uncomplicated: Secondary | ICD-10-CM | POA: Insufficient documentation

## 2018-12-16 DIAGNOSIS — E119 Type 2 diabetes mellitus without complications: Secondary | ICD-10-CM | POA: Diagnosis not present

## 2018-12-16 DIAGNOSIS — I4819 Other persistent atrial fibrillation: Secondary | ICD-10-CM | POA: Diagnosis not present

## 2018-12-16 DIAGNOSIS — I272 Pulmonary hypertension, unspecified: Secondary | ICD-10-CM | POA: Diagnosis not present

## 2018-12-16 DIAGNOSIS — I080 Rheumatic disorders of both mitral and aortic valves: Secondary | ICD-10-CM | POA: Insufficient documentation

## 2018-12-16 NOTE — Progress Notes (Signed)
  Echocardiogram 2D Echocardiogram has been performed.  [name] 12/16/2018, 1:45 PM

## 2018-12-20 ENCOUNTER — Encounter (HOSPITAL_COMMUNITY): Payer: Self-pay | Admitting: *Deleted

## 2019-01-06 ENCOUNTER — Encounter: Payer: Medicare Other | Admitting: Family Medicine

## 2019-01-12 ENCOUNTER — Other Ambulatory Visit: Payer: Self-pay

## 2019-01-12 ENCOUNTER — Ambulatory Visit: Payer: Medicare Other | Attending: Family Medicine | Admitting: Family Medicine

## 2019-01-12 ENCOUNTER — Encounter: Payer: Self-pay | Admitting: Family Medicine

## 2019-01-12 VITALS — BP 125/77 | HR 55 | Temp 98.0°F | Resp 16

## 2019-01-12 DIAGNOSIS — Z8249 Family history of ischemic heart disease and other diseases of the circulatory system: Secondary | ICD-10-CM | POA: Diagnosis not present

## 2019-01-12 DIAGNOSIS — N2889 Other specified disorders of kidney and ureter: Secondary | ICD-10-CM

## 2019-01-12 DIAGNOSIS — E1159 Type 2 diabetes mellitus with other circulatory complications: Secondary | ICD-10-CM | POA: Diagnosis not present

## 2019-01-12 DIAGNOSIS — Z79899 Other long term (current) drug therapy: Secondary | ICD-10-CM

## 2019-01-12 DIAGNOSIS — L409 Psoriasis, unspecified: Secondary | ICD-10-CM

## 2019-01-12 DIAGNOSIS — L400 Psoriasis vulgaris: Secondary | ICD-10-CM | POA: Insufficient documentation

## 2019-01-12 DIAGNOSIS — N183 Chronic kidney disease, stage 3 unspecified: Secondary | ICD-10-CM

## 2019-01-12 DIAGNOSIS — Z89612 Acquired absence of left leg above knee: Secondary | ICD-10-CM

## 2019-01-12 DIAGNOSIS — Z823 Family history of stroke: Secondary | ICD-10-CM | POA: Insufficient documentation

## 2019-01-12 DIAGNOSIS — I5032 Chronic diastolic (congestive) heart failure: Secondary | ICD-10-CM

## 2019-01-12 DIAGNOSIS — Z23 Encounter for immunization: Secondary | ICD-10-CM

## 2019-01-12 DIAGNOSIS — I4891 Unspecified atrial fibrillation: Secondary | ICD-10-CM | POA: Diagnosis not present

## 2019-01-12 DIAGNOSIS — Z89611 Acquired absence of right leg above knee: Secondary | ICD-10-CM | POA: Diagnosis not present

## 2019-01-12 DIAGNOSIS — Z87891 Personal history of nicotine dependence: Secondary | ICD-10-CM | POA: Diagnosis not present

## 2019-01-12 DIAGNOSIS — Z86718 Personal history of other venous thrombosis and embolism: Secondary | ICD-10-CM | POA: Diagnosis not present

## 2019-01-12 DIAGNOSIS — Z7901 Long term (current) use of anticoagulants: Secondary | ICD-10-CM

## 2019-01-12 DIAGNOSIS — E1151 Type 2 diabetes mellitus with diabetic peripheral angiopathy without gangrene: Secondary | ICD-10-CM | POA: Diagnosis not present

## 2019-01-12 DIAGNOSIS — I130 Hypertensive heart and chronic kidney disease with heart failure and stage 1 through stage 4 chronic kidney disease, or unspecified chronic kidney disease: Secondary | ICD-10-CM | POA: Insufficient documentation

## 2019-01-12 DIAGNOSIS — I739 Peripheral vascular disease, unspecified: Secondary | ICD-10-CM | POA: Diagnosis not present

## 2019-01-12 DIAGNOSIS — E1122 Type 2 diabetes mellitus with diabetic chronic kidney disease: Secondary | ICD-10-CM | POA: Diagnosis not present

## 2019-01-12 DIAGNOSIS — I4892 Unspecified atrial flutter: Secondary | ICD-10-CM

## 2019-01-12 MED ORDER — FLUOCINOLONE ACETONIDE 0.025 % EX CREA: TOPICAL_CREAM | Freq: Two times a day (BID) | CUTANEOUS | 99 refills | Status: DC

## 2019-01-12 NOTE — Progress Notes (Signed)
Established Patient Office Visit  Subjective:  Patient ID: [name] [name], male    DOB: 03-13-1927  Age: 85 y.o. MRN: [id number]  CC:  Chief Complaint  Patient presents with  . Hospitalization Follow-up    HPI [name] [name] presents for follow-up of chronic medical issues and is status post hospital admission from 11/23/2018 through 11/25/2018 but missed his recent office follow-up visit regarding hospital discharge.  Patient was admitted for CHF exacerbation after complaining of shortness of breath for approximately 1 week prior to admission and he was found to have elevated BNP and bilateral pulmonary interstitial prominence with left-sided pleural effusion on chest x-ray consistent with CHF. Patient's last echocardiogram in March 2020 showed an ejection fraction of 15 to 20% with diffuse hypokinesis.  Patient additionally has chronic issues of type 2 diabetes which is currently diet controlled with last hemoglobin A1c of 5.8 on 10/25/2018, history of peripheral vascular disease status post bilateral AKA, history of gout for which he is on allopurinol and history of severe psoriasis in addition to CHF, he also has A. fib/flutter for which he is on Eliquis and metoprolol and is also followed by cardiologist, Dr. Oval Linsey.  On labs done during hospitalization, patient with normal CBC with exception of white blood cell count of 2.9.  Patient with creatinine of 1.49.      At today's visit he reports that he feels well.  No recent shortness of breath or sensation of feeling swollen.  He has had no chest pain or palpitations.  He denies any increased thirst, no urinary frequency.  He continues to have issues with his psoriasis but states that he was finally able to get larger sized tubes of steroid cream and this is helped with the psoriasis on his arms.  He continues to have issues with dry, flaky skin on his head and face and he does not have anything that he can use on these areas and he would like to be  prescribed medication to help with the psoriasis on his face.  He denies any issues with joint pain due to gout.  He reports that he is compliant with all of his medications at this time.  He also has a card with him from [employer] which he states is a company that helps to make prosthetic limbs and he states that he was told that he needs a referral to physical therapy for 6-week program before he can obtain prosthetic lower limbs.  He has had no unusual bruising or bleeding related to his use of anticoagulant medicine.  He denies abdominal pain-no nausea/vomiting/diarrhea or constipation.  No blood in the stool or black stools.  He denies any overall he feels very well at today's visit.  Past Medical History:  Diagnosis Date  . Chronic kidney disease    RENAL INSUFICCIENCY  . Diabetes mellitus without complication ([place])   . DVT (deep venous thrombosis) ([place])   . Gout   . Hypertension   . Lymphadenitis 08/10/2012  . Psoriasis     Past Surgical History:  Procedure Laterality Date  . AMPUTATION Bilateral 10/08/2017   Procedure: Bilateral  ABOVE KNEE amputations;  Surgeon: Conrad Delano, MD;  Location: [facility] OR;  Service: Vascular;  Laterality: Bilateral;    Family History  Problem Relation Age of Onset  . Hypertension Mother   . Stroke Mother   . Hypertension Father     Social History   Socioeconomic History  . Marital status: Widowed    Spouse name: Not  on file  . Number of children: Not on file  . Years of education: Not on file  . Highest education level: Not on file  Occupational History  . Occupation: Retired  Scientific laboratory technician  . Financial resource strain: Not on file  . Food insecurity    Worry: Not on file    Inability: Not on file  . Transportation needs    Medical: Not on file    Non-medical: Not on file  Tobacco Use  . Smoking status: Former Smoker    Years: 40.00    Types: Pipe  . Smokeless tobacco: Never Used  Substance and Sexual Activity  . Alcohol use: No  .  Drug use: No  . Sexual activity: Not on file  Lifestyle  . Physical activity    Days per week: Not on file    Minutes per session: Not on file  . Stress: Not on file  Relationships  . Social Herbalist on phone: Not on file    Gets together: Not on file    Attends religious service: Not on file    Active member of club or organization: Not on file    Attends meetings of clubs or organizations: Not on file    Relationship status: Not on file  . Intimate partner violence    Fear of current or ex partner: Not on file    Emotionally abused: Not on file    Physically abused: Not on file    Forced sexual activity: Not on file  Other Topics Concern  . Not on file  Social History Narrative   He has a room in his daughter's house.    Outpatient Medications Prior to Visit  Medication Sig Dispense Refill  . apixaban (ELIQUIS) 5 MG TABS tablet Take 1 tablet (5 mg total) by mouth 2 (two) times daily. 60 tablet 5  . furosemide (LASIX) 40 MG tablet Take 1 tablet (40 mg total) by mouth daily. 30 tablet 5  . hydrocortisone cream 1 % Apply topically 2 (two) times daily. 30 g 1  . losartan (COZAAR) 50 MG tablet Take 1 tablet (50 mg total) by mouth daily. 30 tablet 5  . metoprolol succinate (TOPROL-XL) 100 MG 24 hr tablet Take 1 tablet (100 mg total) by mouth daily. Take with or immediately following a meal. 30 tablet 3  . potassium chloride 20 MEQ TBCR Take 20 mEq by mouth daily. 30 tablet 0  . gabapentin (NEURONTIN) 100 MG capsule Patient takes 2 Capsule in the Morning, 2 Capsule every evening and 3 Capsules at Bedtime. (Patient taking differently: Take 200-300 mg by mouth See admin instructions. Take 200 mg by mouth in the morning, 200 mg in the evening, and 300 mg at bedtime) 210 capsule 4   No facility-administered medications prior to visit.     No Known Allergies  ROS Review of Systems  Constitutional: Negative for chills, fatigue and fever.  HENT: Negative for sore throat  and trouble swallowing.   Eyes: Negative for photophobia and visual disturbance.  Respiratory: Negative for cough and shortness of breath.   Cardiovascular: Negative for chest pain, palpitations and leg swelling.  Gastrointestinal: Negative for abdominal pain, blood in stool, constipation, diarrhea and nausea.  Endocrine: Negative for polydipsia, polyphagia and polyuria.  Genitourinary: Negative for dysuria and frequency.  Musculoskeletal: Positive for gait problem. Negative for arthralgias and back pain.  Skin: Positive for rash. Negative for wound.  Neurological: Negative for dizziness and headaches.  Hematological:  Negative for adenopathy. Does not bruise/bleed easily.      Objective:    Physical Exam  Constitutional: He is oriented to person, place, and time. He appears well-developed and well-nourished.  Neck: Normal range of motion. No JVD present.  Cardiovascular: Normal rate and regular rhythm.  Pulmonary/Chest: Effort normal and breath sounds normal. No respiratory distress. He has no rales.  Abdominal: Soft. There is no abdominal tenderness. There is no rebound and no guarding.  Musculoskeletal:        General: Deformity (Status post bilateral AKA) present. No tenderness or edema.     Comments: No active skin breakdown on the stumps  Lymphadenopathy:    He has no cervical adenopathy.  Neurological: He is alert and oriented to person, place, and time.  Skin: Skin is warm and dry.  Patient with thickened, flaky skin on the scalp and down the left side of the face/ear and on the right cheek.  Patient has psoriatic plaques on the bilateral forearms but skin looks shiny and moisturized  Nursing note and vitals reviewed.   BP 125/77 (BP Location: Left Arm, Patient Position: Sitting, Cuff Size: Large)   Pulse (!) 55   Temp 98 F (36.7 C) (Oral)   Resp 16   SpO2 99% Patient is sitting in a motorized wheelchair Wt Readings from Last 3 Encounters:  11/25/18 169 lb 5 oz (76.8  kg)  07/17/18 178 lb 12.7 oz (81.1 kg)  12/07/17 240 lb (108.9 kg)     Health Maintenance Due  Topic Date Due  . OPHTHALMOLOGY EXAM  09/30/1938  . PNA vac Low Risk Adult (2 of 2 - PCV13) 08/12/2013  . INFLUENZA VACCINE  11/27/2018      Lab Results  Component Value Date   TSH 3.091 07/15/2018   Lab Results  Component Value Date   WBC 3.1 (L) 01/12/2019   HGB 15.5 01/12/2019   HCT 46.2 01/12/2019   MCV 95 01/12/2019   PLT 201 01/12/2019   Lab Results  Component Value Date   NA 146 (H) 01/12/2019   K 4.6 01/12/2019   CO2 20 01/12/2019   GLUCOSE 82 01/12/2019   BUN 21 01/12/2019   CREATININE 1.31 (H) 01/12/2019   BILITOT 0.7 10/08/2018   ALKPHOS 55 10/08/2018   AST 26 10/08/2018   ALT 10 10/08/2018   PROT 7.3 10/08/2018   ALBUMIN 3.9 10/08/2018   CALCIUM 9.9 01/12/2019   ANIONGAP 13 11/25/2018   Lab Results  Component Value Date   CHOL 197 10/08/2018   Lab Results  Component Value Date   HDL 58 10/08/2018   Lab Results  Component Value Date   LDLCALC 118 (H) 10/08/2018   Lab Results  Component Value Date   TRIG 103 10/08/2018   Lab Results  Component Value Date   CHOLHDL 3.4 10/08/2018   Lab Results  Component Value Date   HGBA1C 5.8 (H) 10/25/2018      Assessment & Plan:  1. Chronic diastolic CHF (congestive heart failure) ([place]) Patient reports no significant issues with shortness of breath or increased peripheral edema.  CHF appears to be controlled/stable at this time.  Continue current medications and cardiology follow-up. - Basic Metabolic Panel  2. PAD (peripheral artery disease) ([place]) Patient with known peripheral arterial disease which resulted in the need for bilateral above-the-knee amputations as patient had severe pain related to peripheral arterial disease.  Patient had lipid panel in June with LDL elevated at 118.  Patient had been on statin  therapy in the past however at some point this appears to have been discontinued either  during a hospitalization or perhaps patient ran out of medication and did not refill it.  After his last lab visit, his daughter was to be contacted to find out if she knew any information as to when medication was discontinued or if there was any specific reason for which medication was discontinued.  We will have CMA reach out to patient's daughter regarding use of statin medication.  Due to patient's advanced age, decision may have been made to stop statin medication if he was having any side effects.  3. Type 2 diabetes mellitus with other circulatory complication, without long-term current use of insulin ([place]) Patient will have basic metabolic panel in follow-up of his type 2 diabetes which he feels is well controlled at this time.  Most recent hemoglobin A1c done 10/25/2018 was 5.8 indicating that his blood sugars were currently very well controlled - Basic Metabolic Panel  4. Chronic renal insufficiency, stage 3 (moderate) ([facility]) Patient with chronic kidney disease/renal insufficiency.  He will have a BMP to check electrolyte status and CBC to look for anemia that may be related to his chronic kidney disease.  He is reminded to avoid the use of nonsteroidal anti-inflammatories especially as he is also on blood thinning medication but also because NSAIDs can worsen his renal function - Basic Metabolic Panel - CBC with Differential  5. Atrial fibrillation and flutter ([place]) Patient with atrial fibrillation/flutter for which she is currently on anticoagulation with Eliquis to help reduce stroke risk and patient with rate control with use of metoprolol - CBC with Differential  6. S/P AKA (above knee amputation) bilateral ([place]) Patient reports that he needs referral back to physical therapy to help regain strength so that he might be able to obtain lower extremity prosthesis.  Referral provided - Ambulatory referral to Physical Therapy  7. Psoriasis Patient reports improvement in psoriatic  plaques/scaly skin on the arms however he continues to have severe issues with psoriasis on the scalp/face with dryness and flakiness of the skin.  Prescription provided for fluocinolone cream and patient is being referred to dermatology for further evaluation and treatment - Ambulatory referral to Dermatology - fluocinolone (SYNALAR) 0.025 % cream; Apply topically 2 (two) times daily. To the scalp and sparingly to the face  Dispense: 120 g; Refill: prn  8. Encounter for long-term (current) use of medications Patient will have basic metabolic panel at today's visit in follow-up of long-term use of medications for treatment of CHF and hypertension - Basic Metabolic Panel  9. Long term current use of anticoagulant therapy Patient will have CBC in follow-up of his long-term use of anticoagulant therapy to look for any anemia or platelet abnormality which may result from his use of the medication. - CBC with Differential  10. Need for immunization against influenza Patient was offered and agreed to have influenza immunization at today's visit.  Patient was also provided with educational handout regarding immunization.   Meds ordered this encounter  Medications  . fluocinolone (SYNALAR) 0.025 % cream    Sig: Apply topically 2 (two) times daily. To the scalp and sparingly to the face    Dispense:  120 g    Refill:  prn    Follow-up: Return in about 4 months (around 05/14/2019) for Chronic issues-4 months and as needed.    [name], MD

## 2019-01-12 NOTE — Patient Instructions (Addendum)
Please have someone in your family obtain over-the-counter Imodium that you can take as needed for diarrhea.  If you have severe diarrhea or if diarrhea is not controlled with the use of Imodium, please seek follow-up at urgent care, emergency department or make office follow-up appointment.  Referrals have been placed for you at today's visit to follow-up with a skin specialist, dermatologist regarding your psoriasis.  A prescription was also sent into your pharmacy for a medication to help with the psoriasis on your scalp and face.  A referral was placed for you to be evaluated by physical therapy regarding the use of lower extremity prostheses.  Please call or have someone in your family call the office if you have not heard anything regarding your physical therapy or dermatology referral appointments within the next 2 weeks.   Influenza Virus Vaccine injection (Fluarix) What is this medicine? INFLUENZA VIRUS VACCINE (in floo EN zuh VAHY ruhs vak SEEN) helps to reduce the risk of getting influenza also known as the flu. This medicine may be used for other purposes; ask your health care provider or pharmacist if you have questions. COMMON BRAND NAME(S): Fluarix, Fluzone What should I tell my health care provider before I take this medicine? They need to know if you have any of these conditions:  bleeding disorder like hemophilia  fever or infection  Guillain-Barre syndrome or other neurological problems  immune system problems  infection with the human immunodeficiency virus (HIV) or AIDS  low blood platelet counts  multiple sclerosis  an unusual or allergic reaction to influenza virus vaccine, eggs, chicken proteins, latex, gentamicin, other medicines, foods, dyes or preservatives  pregnant or trying to get pregnant  breast-feeding How should I use this medicine? This vaccine is for injection into a muscle. It is given by a health care professional. A copy of Vaccine  Information Statements will be given before each vaccination. Read this sheet carefully each time. The sheet may change frequently. Talk to your pediatrician regarding the use of this medicine in children. Special care may be needed. Overdosage: If you think you have taken too much of this medicine contact a poison control center or emergency room at once. NOTE: This medicine is only for you. Do not share this medicine with others. What if I miss a dose? This does not apply. What may interact with this medicine?  chemotherapy or radiation therapy  medicines that lower your immune system like etanercept, anakinra, infliximab, and adalimumab  medicines that treat or prevent blood clots like warfarin  phenytoin  steroid medicines like prednisone or cortisone  theophylline  vaccines This list may not describe all possible interactions. Give your health care provider a list of all the medicines, herbs, non-prescription drugs, or dietary supplements you use. Also tell them if you smoke, drink alcohol, or use illegal drugs. Some items may interact with your medicine. What should I watch for while using this medicine? Report any side effects that do not go away within 3 days to your doctor or health care professional. Call your health care provider if any unusual symptoms occur within 6 weeks of receiving this vaccine. You may still catch the flu, but the illness is not usually as bad. You cannot get the flu from the vaccine. The vaccine will not protect against colds or other illnesses that may cause fever. The vaccine is needed every year. What side effects may I notice from receiving this medicine? Side effects that you should report to your doctor or  health care professional as soon as possible:  allergic reactions like skin rash, itching or hives, swelling of the face, lips, or tongue Side effects that usually do not require medical attention (report to your doctor or health care  professional if they continue or are bothersome):  fever  headache  muscle aches and pains  pain, tenderness, redness, or swelling at site where injected  weak or tired This list may not describe all possible side effects. Call your doctor for medical advice about side effects. You may report side effects to FDA at 1-800-FDA-1088. Where should I keep my medicine? This vaccine is only given in a clinic, pharmacy, doctor's office, or other health care setting and will not be stored at home. NOTE: This sheet is a summary. It may not cover all possible information. If you have questions about this medicine, talk to your doctor, pharmacist, or health care provider.  2020 Elsevier/Gold Standard (2007-11-10 09:30:40)

## 2019-01-13 LAB — BASIC METABOLIC PANEL WITH GFR
BUN/Creatinine Ratio: 16 (ref 10–24)
BUN: 21 mg/dL (ref 10–36)
CO2: 20 mmol/L (ref 20–29)
Calcium: 9.9 mg/dL (ref 8.6–10.2)
Chloride: 103 mmol/L (ref 96–106)
Creatinine, Ser: 1.31 mg/dL — ABNORMAL HIGH (ref 0.76–1.27)
GFR calc Af Amer: 55 mL/min/1.73 — ABNORMAL LOW
GFR calc non Af Amer: 48 mL/min/1.73 — ABNORMAL LOW
Glucose: 82 mg/dL (ref 65–99)
Potassium: 4.6 mmol/L (ref 3.5–5.2)
Sodium: 146 mmol/L — ABNORMAL HIGH (ref 134–144)

## 2019-01-13 LAB — CBC WITH DIFFERENTIAL/PLATELET
Basophils Absolute: 0 x10E3/uL (ref 0.0–0.2)
Basos: 1 %
EOS (ABSOLUTE): 0.1 x10E3/uL (ref 0.0–0.4)
Eos: 4 %
Hematocrit: 46.2 % (ref 37.5–51.0)
Hemoglobin: 15.5 g/dL (ref 13.0–17.7)
Immature Grans (Abs): 0 x10E3/uL (ref 0.0–0.1)
Immature Granulocytes: 0 %
Lymphocytes Absolute: 0.7 x10E3/uL (ref 0.7–3.1)
Lymphs: 23 %
MCH: 31.9 pg (ref 26.6–33.0)
MCHC: 33.5 g/dL (ref 31.5–35.7)
MCV: 95 fL (ref 79–97)
Monocytes Absolute: 0.4 x10E3/uL (ref 0.1–0.9)
Monocytes: 12 %
Neutrophils Absolute: 1.9 x10E3/uL (ref 1.4–7.0)
Neutrophils: 60 %
Platelets: 201 x10E3/uL (ref 150–450)
RBC: 4.86 x10E6/uL (ref 4.14–5.80)
RDW: 13 % (ref 11.6–15.4)
WBC: 3.1 x10E3/uL — ABNORMAL LOW (ref 3.4–10.8)

## 2019-02-10 ENCOUNTER — Other Ambulatory Visit: Payer: Self-pay | Admitting: Family Medicine

## 2019-02-10 DIAGNOSIS — M792 Neuralgia and neuritis, unspecified: Secondary | ICD-10-CM

## 2019-02-27 DIAGNOSIS — Z23 Encounter for immunization: Secondary | ICD-10-CM | POA: Diagnosis not present

## 2019-03-14 ENCOUNTER — Other Ambulatory Visit: Payer: Self-pay | Admitting: Family Medicine

## 2019-03-14 DIAGNOSIS — I4891 Unspecified atrial fibrillation: Secondary | ICD-10-CM

## 2019-03-14 DIAGNOSIS — I5032 Chronic diastolic (congestive) heart failure: Secondary | ICD-10-CM

## 2019-03-18 ENCOUNTER — Encounter (HOSPITAL_COMMUNITY): Payer: Self-pay | Admitting: Emergency Medicine

## 2019-03-18 ENCOUNTER — Encounter (HOSPITAL_COMMUNITY): Payer: Self-pay

## 2019-03-18 ENCOUNTER — Ambulatory Visit (INDEPENDENT_AMBULATORY_CARE_PROVIDER_SITE_OTHER)
Admission: EM | Admit: 2019-03-18 | Discharge: 2019-03-18 | Disposition: A | Discharge status: Home / Self Care | Payer: Medicare Other | Source: Home / Self Care

## 2019-03-18 ENCOUNTER — Inpatient Hospital Stay (HOSPITAL_COMMUNITY)
Admission: EM | Admit: 2019-03-18 | Discharge: 2019-03-26 | DRG: 291 | Disposition: A | Discharge status: Home Health | Payer: Medicare Other | Attending: Internal Medicine | Admitting: Internal Medicine

## 2019-03-18 ENCOUNTER — Emergency Department (HOSPITAL_COMMUNITY): Payer: Medicare Other

## 2019-03-18 ENCOUNTER — Other Ambulatory Visit: Payer: Self-pay

## 2019-03-18 DIAGNOSIS — Z89612 Acquired absence of left leg above knee: Secondary | ICD-10-CM | POA: Diagnosis not present

## 2019-03-18 DIAGNOSIS — L409 Psoriasis, unspecified: Secondary | ICD-10-CM | POA: Diagnosis present

## 2019-03-18 DIAGNOSIS — L8990 Pressure ulcer of unspecified site, unspecified stage: Secondary | ICD-10-CM | POA: Diagnosis present

## 2019-03-18 DIAGNOSIS — E1151 Type 2 diabetes mellitus with diabetic peripheral angiopathy without gangrene: Secondary | ICD-10-CM | POA: Diagnosis present

## 2019-03-18 DIAGNOSIS — Z8679 Personal history of other diseases of the circulatory system: Secondary | ICD-10-CM | POA: Diagnosis not present

## 2019-03-18 DIAGNOSIS — I480 Paroxysmal atrial fibrillation: Secondary | ICD-10-CM

## 2019-03-18 DIAGNOSIS — Z89611 Acquired absence of right leg above knee: Secondary | ICD-10-CM

## 2019-03-18 DIAGNOSIS — N433 Hydrocele, unspecified: Secondary | ICD-10-CM | POA: Diagnosis present

## 2019-03-18 DIAGNOSIS — I110 Hypertensive heart disease with heart failure: Secondary | ICD-10-CM | POA: Diagnosis not present

## 2019-03-18 DIAGNOSIS — Z20828 Contact with and (suspected) exposure to other viral communicable diseases: Secondary | ICD-10-CM | POA: Diagnosis present

## 2019-03-18 DIAGNOSIS — J9811 Atelectasis: Secondary | ICD-10-CM | POA: Diagnosis present

## 2019-03-18 DIAGNOSIS — I5082 Biventricular heart failure: Secondary | ICD-10-CM | POA: Diagnosis present

## 2019-03-18 DIAGNOSIS — F0390 Unspecified dementia without behavioral disturbance: Secondary | ICD-10-CM | POA: Diagnosis present

## 2019-03-18 DIAGNOSIS — I081 Rheumatic disorders of both mitral and tricuspid valves: Secondary | ICD-10-CM | POA: Diagnosis present

## 2019-03-18 DIAGNOSIS — I272 Pulmonary hypertension, unspecified: Secondary | ICD-10-CM | POA: Diagnosis present

## 2019-03-18 DIAGNOSIS — I5032 Chronic diastolic (congestive) heart failure: Secondary | ICD-10-CM

## 2019-03-18 DIAGNOSIS — N5089 Other specified disorders of the male genital organs: Secondary | ICD-10-CM

## 2019-03-18 DIAGNOSIS — I493 Ventricular premature depolarization: Secondary | ICD-10-CM | POA: Diagnosis not present

## 2019-03-18 DIAGNOSIS — Y92009 Unspecified place in unspecified non-institutional (private) residence as the place of occurrence of the external cause: Secondary | ICD-10-CM

## 2019-03-18 DIAGNOSIS — I861 Scrotal varices: Secondary | ICD-10-CM | POA: Diagnosis present

## 2019-03-18 DIAGNOSIS — I10 Essential (primary) hypertension: Secondary | ICD-10-CM | POA: Diagnosis not present

## 2019-03-18 DIAGNOSIS — Z7901 Long term (current) use of anticoagulants: Secondary | ICD-10-CM | POA: Diagnosis not present

## 2019-03-18 DIAGNOSIS — Z8249 Family history of ischemic heart disease and other diseases of the circulatory system: Secondary | ICD-10-CM | POA: Diagnosis not present

## 2019-03-18 DIAGNOSIS — E1122 Type 2 diabetes mellitus with diabetic chronic kidney disease: Secondary | ICD-10-CM | POA: Diagnosis present

## 2019-03-18 DIAGNOSIS — Z87891 Personal history of nicotine dependence: Secondary | ICD-10-CM | POA: Diagnosis not present

## 2019-03-18 DIAGNOSIS — I5043 Acute on chronic combined systolic (congestive) and diastolic (congestive) heart failure: Secondary | ICD-10-CM | POA: Diagnosis present

## 2019-03-18 DIAGNOSIS — T501X6A Underdosing of loop [high-ceiling] diuretics, initial encounter: Secondary | ICD-10-CM | POA: Diagnosis present

## 2019-03-18 DIAGNOSIS — E785 Hyperlipidemia, unspecified: Secondary | ICD-10-CM | POA: Diagnosis present

## 2019-03-18 DIAGNOSIS — I5021 Acute systolic (congestive) heart failure: Secondary | ICD-10-CM | POA: Diagnosis not present

## 2019-03-18 DIAGNOSIS — I77810 Thoracic aortic ectasia: Secondary | ICD-10-CM | POA: Diagnosis present

## 2019-03-18 DIAGNOSIS — I509 Heart failure, unspecified: Secondary | ICD-10-CM | POA: Diagnosis present

## 2019-03-18 DIAGNOSIS — Z91128 Patient's intentional underdosing of medication regimen for other reason: Secondary | ICD-10-CM

## 2019-03-18 DIAGNOSIS — N183 Chronic kidney disease, stage 3 unspecified: Secondary | ICD-10-CM | POA: Diagnosis present

## 2019-03-18 DIAGNOSIS — Z79899 Other long term (current) drug therapy: Secondary | ICD-10-CM | POA: Diagnosis not present

## 2019-03-18 DIAGNOSIS — I9589 Other hypotension: Secondary | ICD-10-CM | POA: Diagnosis not present

## 2019-03-18 DIAGNOSIS — Z823 Family history of stroke: Secondary | ICD-10-CM

## 2019-03-18 DIAGNOSIS — I959 Hypotension, unspecified: Secondary | ICD-10-CM | POA: Diagnosis present

## 2019-03-18 DIAGNOSIS — Z86718 Personal history of other venous thrombosis and embolism: Secondary | ICD-10-CM | POA: Diagnosis not present

## 2019-03-18 DIAGNOSIS — E861 Hypovolemia: Secondary | ICD-10-CM | POA: Diagnosis not present

## 2019-03-18 DIAGNOSIS — N4889 Other specified disorders of penis: Secondary | ICD-10-CM

## 2019-03-18 DIAGNOSIS — I130 Hypertensive heart and chronic kidney disease with heart failure and stage 1 through stage 4 chronic kidney disease, or unspecified chronic kidney disease: Principal | ICD-10-CM | POA: Diagnosis present

## 2019-03-18 DIAGNOSIS — I5023 Acute on chronic systolic (congestive) heart failure: Secondary | ICD-10-CM | POA: Diagnosis not present

## 2019-03-18 LAB — CBC WITH DIFFERENTIAL/PLATELET
Abs Immature Granulocytes: 0.01 10*3/uL (ref 0.00–0.07)
Basophils Absolute: 0 10*3/uL (ref 0.0–0.1)
Basophils Relative: 1 %
Eosinophils Absolute: 0.1 10*3/uL (ref 0.0–0.5)
Eosinophils Relative: 2 %
HCT: 42.7 % (ref 39.0–52.0)
Hemoglobin: 13.8 g/dL (ref 13.0–17.0)
Immature Granulocytes: 0 %
Lymphocytes Relative: 24 %
Lymphs Abs: 0.8 10*3/uL (ref 0.7–4.0)
MCH: 33 pg (ref 26.0–34.0)
MCHC: 32.3 g/dL (ref 30.0–36.0)
MCV: 102.2 fL — ABNORMAL HIGH (ref 80.0–100.0)
Monocytes Absolute: 0.5 10*3/uL (ref 0.1–1.0)
Monocytes Relative: 14 %
Neutro Abs: 2 10*3/uL (ref 1.7–7.7)
Neutrophils Relative %: 59 %
Platelets: 162 10*3/uL (ref 150–400)
RBC: 4.18 MIL/uL — ABNORMAL LOW (ref 4.22–5.81)
RDW: 16.1 % — ABNORMAL HIGH (ref 11.5–15.5)
WBC: 3.4 10*3/uL — ABNORMAL LOW (ref 4.0–10.5)
nRBC: 0 % (ref 0.0–0.2)

## 2019-03-18 LAB — URINALYSIS, ROUTINE W REFLEX MICROSCOPIC
Bilirubin Urine: NEGATIVE
Glucose, UA: NEGATIVE mg/dL
Ketones, ur: NEGATIVE mg/dL
Nitrite: NEGATIVE
Protein, ur: NEGATIVE mg/dL
Specific Gravity, Urine: 1.006 (ref 1.005–1.030)
WBC, UA: >50 WBC/hpf — ABNORMAL HIGH (ref 0–5)
pH: 6 (ref 5.0–8.0)

## 2019-03-18 LAB — BASIC METABOLIC PANEL
Anion gap: 10 (ref 5–15)
BUN: 19 mg/dL (ref 8–23)
CO2: 27 mmol/L (ref 22–32)
Calcium: 8.8 mg/dL — ABNORMAL LOW (ref 8.9–10.3)
Chloride: 104 mmol/L (ref 98–111)
Creatinine, Ser: 1.48 mg/dL — ABNORMAL HIGH (ref 0.61–1.24)
GFR calc Af Amer: 48 mL/min — ABNORMAL LOW (ref >60)
GFR calc non Af Amer: 41 mL/min — ABNORMAL LOW (ref >60)
Glucose, Bld: 128 mg/dL — ABNORMAL HIGH (ref 70–99)
Potassium: 3.5 mmol/L (ref 3.5–5.1)
Sodium: 141 mmol/L (ref 135–145)

## 2019-03-18 LAB — SARS CORONAVIRUS 2 (TAT 6-24 HRS): SARS Coronavirus 2: NEGATIVE

## 2019-03-18 LAB — BRAIN NATRIURETIC PEPTIDE: B Natriuretic Peptide: 2136.8 pg/mL — ABNORMAL HIGH (ref 0.0–100.0)

## 2019-03-18 MED ORDER — ONDANSETRON HCL 4 MG/2ML IJ SOLN: 4.0000 mg | Freq: Four times a day (QID) | INTRAMUSCULAR | Status: DC | PRN

## 2019-03-18 MED ORDER — FUROSEMIDE 10 MG/ML IJ SOLN
40.0000 mg | Freq: Two times a day (BID) | INTRAMUSCULAR | Status: DC
  Administered 2019-03-19 – 2019-03-21 (×6): 40 mg via INTRAVENOUS
  Filled 2019-03-18 (×6): qty 4

## 2019-03-18 MED ORDER — GABAPENTIN 100 MG PO CAPS
200.0000 mg | ORAL_CAPSULE | Freq: Two times a day (BID) | ORAL | Status: DC
  Administered 2019-03-19 – 2019-03-26 (×15): 200 mg via ORAL
  Filled 2019-03-18 (×16): qty 2

## 2019-03-18 MED ORDER — ONDANSETRON HCL 4 MG PO TABS: 4.0000 mg | ORAL_TABLET | Freq: Four times a day (QID) | ORAL | Status: DC | PRN

## 2019-03-18 MED ORDER — GABAPENTIN 300 MG PO CAPS
300.0000 mg | ORAL_CAPSULE | Freq: Every day | ORAL | Status: DC
  Administered 2019-03-18 – 2019-03-25 (×8): 300 mg via ORAL
  Filled 2019-03-18: qty 1
  Filled 2019-03-18: qty 3
  Filled 2019-03-18 (×6): qty 1

## 2019-03-18 MED ORDER — METOPROLOL SUCCINATE ER 100 MG PO TB24
100.0000 mg | ORAL_TABLET | Freq: Every day | ORAL | Status: DC
  Administered 2019-03-20 – 2019-03-21 (×2): 100 mg via ORAL
  Filled 2019-03-18 (×3): qty 1

## 2019-03-18 MED ORDER — LOSARTAN POTASSIUM 50 MG PO TABS
50.0000 mg | ORAL_TABLET | Freq: Every day | ORAL | Status: DC
  Administered 2019-03-19 – 2019-03-21 (×3): 50 mg via ORAL
  Filled 2019-03-18 (×3): qty 1

## 2019-03-18 MED ORDER — FUROSEMIDE 10 MG/ML IJ SOLN
40.0000 mg | Freq: Once | INTRAMUSCULAR | Status: AC
  Administered 2019-03-18: 40 mg via INTRAVENOUS
  Filled 2019-03-18: qty 4

## 2019-03-18 MED ORDER — TRIAMCINOLONE ACETONIDE 0.025 % EX CREA
TOPICAL_CREAM | Freq: Two times a day (BID) | CUTANEOUS | Status: DC
  Administered 2019-03-20: 12:00:00 via TOPICAL
  Administered 2019-03-21: 1 via TOPICAL
  Administered 2019-03-23 – 2019-03-26 (×6): via TOPICAL
  Filled 2019-03-18 (×3): qty 15

## 2019-03-18 MED ORDER — POTASSIUM CHLORIDE CRYS ER 20 MEQ PO TBCR
40.0000 meq | EXTENDED_RELEASE_TABLET | Freq: Once | ORAL | Status: AC
  Administered 2019-03-18: 40 meq via ORAL
  Filled 2019-03-18: qty 2

## 2019-03-18 MED ORDER — SENNOSIDES-DOCUSATE SODIUM 8.6-50 MG PO TABS: 1.0000 | ORAL_TABLET | Freq: Every evening | ORAL | Status: DC | PRN

## 2019-03-18 MED ORDER — SODIUM CHLORIDE 0.9% FLUSH
3.0000 mL | Freq: Two times a day (BID) | INTRAVENOUS | Status: DC
  Administered 2019-03-18 – 2019-03-25 (×15): 3 mL via INTRAVENOUS

## 2019-03-18 MED ORDER — ACETAMINOPHEN 325 MG PO TABS: 650.0000 mg | ORAL_TABLET | Freq: Four times a day (QID) | ORAL | Status: DC | PRN

## 2019-03-18 MED ORDER — ACETAMINOPHEN 650 MG RE SUPP: 650.0000 mg | Freq: Four times a day (QID) | RECTAL | Status: DC | PRN

## 2019-03-18 MED ORDER — APIXABAN 5 MG PO TABS
5.0000 mg | ORAL_TABLET | Freq: Two times a day (BID) | ORAL | Status: DC
  Administered 2019-03-18 – 2019-03-26 (×15): 5 mg via ORAL
  Filled 2019-03-18 (×17): qty 1

## 2019-03-18 NOTE — ED Provider Notes (Signed)
[facility] EMERGENCY DEPARTMENT Provider Note   CSN: [id number] Arrival date & time: 03/18/19  1329     History   Chief Complaint Chief Complaint  Patient presents with  . Groin Swelling    HPI [name] [name] is a 85 y.o. male with a past medical history of prior DVT, hypertension, bilateral AKA's, prior DVT, CHF with EF of 15% presenting to the ED with a chief complaint of testicle and penile swelling for the past 1 to 2 weeks.  States that the discomfort is preventing him from urinating properly.  He does admit that he recently cut back on his Lasix from 40 mg daily to 40 mg every other day due to his constant urination.  He denies any chest pain or shortness of breath.  Denies any dysuria, abdominal pain, injuries or falls, fever, penile discharge.     HPI  Past Medical History:  Diagnosis Date  . Chronic kidney disease    RENAL INSUFICCIENCY  . Diabetes mellitus without complication ([place])   . DVT (deep venous thrombosis) ([place])   . Gout   . Hypertension   . Lymphadenitis 08/10/2012  . Psoriasis     Patient Active Problem List   Diagnosis Date Noted  . Acute on chronic systolic CHF (congestive heart failure) ([place]) 11/23/2018  . Troponin level elevated 11/23/2018  . Acute on chronic heart failure ([place])   . Atrial fibrillation and flutter ([place])   . Atrial fibrillation with RVR ([place]) 07/15/2018  . S/P AKA (above knee amputation) bilateral ([place])   . Pressure injury of skin 10/04/2017  . Hyperkalemia 11/17/2016  . Venous stasis ulcer ([place]) 11/17/2016  . Venous stasis ulcer of lower extremity ([place]) 05/23/2015  . Cellulitis of both lower extremities 10/18/2014  . Chronic renal insufficiency, stage 3 (moderate) ([place]) 10/18/2014  . HTN (hypertension), benign 10/18/2014  . Chronic diastolic CHF (congestive heart failure) ([place]) 10/18/2014  . Lower extremity cellulitis 10/18/2014  . PAD (peripheral artery disease) ([place]) 02/02/2014  . Atherosclerosis of native  arteries of the extremities with ulceration ([place]) 07/22/2013  . Acute renal failure ([place]) 11/15/2012  . Fall 11/15/2012  . Diabetic ulcer of left foot ([place]) 10/15/2012  . Diabetes mellitus ([place]) 10/15/2012  . Lymphadenitis 08/07/2012  . Cellulitis 08/07/2012  . HTN (hypertension) 08/05/2012  . Renal insufficiency 08/05/2012  . Tobacco abuse 08/05/2012  . Gout 08/05/2012  . Hyperglycemia 08/05/2012    Past Surgical History:  Procedure Laterality Date  . AMPUTATION Bilateral 10/08/2017   Procedure: Bilateral  ABOVE KNEE amputations;  Surgeon: Conrad [place], MD;  Location: [facility] OR;  Service: Vascular;  Laterality: Bilateral;        Home Medications    Prior to Admission medications   Medication Sig Start Date End Date Taking? Authorizing Provider  apixaban (ELIQUIS) 5 MG TABS tablet Take 1 tablet (5 mg total) by mouth 2 (two) times daily. 10/08/18   Fulp, Cammie, MD  fluocinolone (SYNALAR) 0.025 % cream Apply topically 2 (two) times daily. To the scalp and sparingly to the face 01/12/19   Fulp, Cammie, MD  furosemide (LASIX) 40 MG tablet Take 1 tablet (40 mg total) by mouth daily. 10/08/18   Fulp, Cammie, MD  gabapentin (NEURONTIN) 100 MG capsule PATIENT TAKES 2 CAPSULES IN THE MORNING AND 2 CAPSULES EVERY EVENING AND 3 CAPSULES AT BEDTIME TIME 02/11/19   Fulp, Cammie, MD  hydrocortisone cream 1 % Apply topically 2 (two) times daily. 11/25/18   Shelly Coss, MD  losartan (  COZAAR) 50 MG tablet Take 1 tablet (50 mg total) by mouth daily. 10/08/18   Fulp, Cammie, MD  metoprolol succinate (TOPROL-XL) 100 MG 24 hr tablet TAKE 1 TABLET BY MOUTH ONCE DAILY WITH OR IMMEDIATELY FOLLOWING A MEAL 03/14/19   Fulp, Cammie, MD  potassium chloride 20 MEQ TBCR Take 20 mEq by mouth daily. 11/25/18 12/25/18  Shelly Coss, MD    Family History Family History  Problem Relation Age of Onset  . Hypertension Mother   . Stroke Mother   . Hypertension Father     Social History Social History    Tobacco Use  . Smoking status: Former Smoker    Years: 40.00    Types: Pipe  . Smokeless tobacco: Never Used  Substance Use Topics  . Alcohol use: No  . Drug use: No     Allergies   Patient has no known allergies.   Review of Systems Review of Systems  Constitutional: Negative for appetite change, chills and fever.  HENT: Negative for ear pain, rhinorrhea, sneezing and sore throat.   Eyes: Negative for photophobia and visual disturbance.  Respiratory: Negative for cough, chest tightness, shortness of breath and wheezing.   Cardiovascular: Negative for chest pain and palpitations.  Gastrointestinal: Negative for abdominal pain, blood in stool, constipation, diarrhea, nausea and vomiting.  Genitourinary: Positive for penile swelling and scrotal swelling. Negative for dysuria, hematuria and urgency.  Musculoskeletal: Negative for myalgias.  Skin: Negative for rash.  Neurological: Negative for dizziness, weakness and light-headedness.     Physical Exam Updated Vital Signs BP (!) 130/95   Pulse 67   Temp 97.7 F (36.5 C) (Oral)   Resp (!) 8   SpO2 100%   Physical Exam Vitals signs and nursing note reviewed. Exam conducted with a chaperone present.  Constitutional:      General: He is not in acute distress.    Appearance: He is well-developed.  HENT:     Head: Normocephalic and atraumatic.     Nose: Nose normal.  Eyes:     General: No scleral icterus.       Left eye: No discharge.     Conjunctiva/sclera: Conjunctivae normal.  Neck:     Musculoskeletal: Normal range of motion and neck supple.  Cardiovascular:     Rate and Rhythm: Normal rate and regular rhythm.     Heart sounds: Normal heart sounds. No murmur. No friction rub. No gallop.   Pulmonary:     Effort: Pulmonary effort is normal. No respiratory distress.     Breath sounds: Normal breath sounds.  Abdominal:     General: Bowel sounds are normal. There is no distension.     Palpations: Abdomen is soft.      Tenderness: There is no abdominal tenderness. There is no guarding.  Genitourinary:    Comments: Generalized scrotal and penile edema.  No tenderness to palpation. Musculoskeletal: Normal range of motion.     Comments: Bilateral AKA's.  Skin:    General: Skin is warm and dry.     Findings: No rash.  Neurological:     Mental Status: He is alert.     Motor: No abnormal muscle tone.     Coordination: Coordination normal.      ED Treatments / Results  Labs (all labs ordered are listed, but only abnormal results are displayed) Labs Reviewed  BASIC METABOLIC PANEL - Abnormal; Notable for the following components:      Result Value   Glucose, Bld 128 (*)  Creatinine, Ser 1.48 (*)    Calcium 8.8 (*)    GFR calc non Af Amer 41 (*)    GFR calc Af Amer 48 (*)    All other components within normal limits  CBC WITH DIFFERENTIAL/PLATELET - Abnormal; Notable for the following components:   WBC 3.4 (*)    RBC 4.18 (*)    MCV 102.2 (*)    RDW 16.1 (*)    All other components within normal limits  BRAIN NATRIURETIC PEPTIDE - Abnormal; Notable for the following components:   B Natriuretic Peptide 2,136.8 (*)    All other components within normal limits  URINE CULTURE  SARS CORONAVIRUS 2 (TAT 6-24 HRS)  URINALYSIS, ROUTINE W REFLEX MICROSCOPIC    EKG EKG Interpretation  Date/Time:  Friday March 18 2019 16:00:22 EST Ventricular Rate:  69 PR Interval:    QRS Duration: 88 QT Interval:  611 QTC Calculation: 655 R Axis:   107 Text Interpretation: Sinus or ectopic atrial rhythm Anterior infarct, old No significant change since last tracing Confirmed by Wandra Arthurs [phone]) on 03/18/2019 4:02:06 PM   Radiology Dg Pelvis Portable  Result Date: 03/18/2019 CLINICAL DATA:  Groin pain, penile swelling. EXAM: PORTABLE PELVIS 1-2 VIEWS COMPARISON:  11/15/2012 FINDINGS: Negative for fracture or focal bone lesion. Degenerative change in the lumbar spine. Hip joints appear normal for  age. IMPRESSION: Negative. Electronically Signed   By: Franchot Gallo M.D.   On: 03/18/2019 16:32   Dg Chest Portable 1 View  Result Date: 03/18/2019 CLINICAL DATA:  Fluid overload EXAM: PORTABLE CHEST 1 VIEW COMPARISON:  11/23/2018 FINDINGS: Cardiac enlargement with mild vascular congestion. Moderate left effusion and left lower lobe atelectasis with mild progression. No effusion on the right. IMPRESSION: Vascular congestion with moderate left effusion and left lower lobe atelectasis. Electronically Signed   By: Franchot Gallo M.D.   On: 03/18/2019 16:19    Procedures Procedures (including critical care time)  Medications Ordered in ED Medications  furosemide (LASIX) injection 40 mg (40 mg Intravenous Given 03/18/19 1704)     Initial Impression / Assessment and Plan / ED Course  I have reviewed the triage vital signs and the nursing notes.  Pertinent labs & imaging results that were available during my care of the patient were reviewed by me and considered in my medical decision making (see chart for details).  Clinical Course as of Mar 17 1825  Fri Mar 18, 2019  1746 B Natriuretic Peptide(!): 2,136.8 [HK]    Clinical Course User Index [HK] Delia Heady, PA-C       85 year old male presenting to the ED for testicular and penile swelling for the past week and a half.  He does admit that he has been noncompliant with his daily 40 mg of Lasix and instead has been taking it every other day due to the constant urination.  Denies any abdominal pain or shortness of breath, chest pain.  On exam there is edematous bilateral testicles and penis without tenderness or signs of infection.  Lab work significant for BNP of greater than 2000.  Chest x-ray with signs of vascular congestion.  Pelvic x-ray is unremarkable.  EKG with no changes from prior tracings.  Covid test is pending.  Patient will need to be admitted for CHF exacerbation due to his fluid overload status.  He was given 40 mg of IV  Lasix here.  Final Clinical Impressions(s) / ED Diagnoses   Final diagnoses:  Acute on chronic congestive heart failure, unspecified  heart failure type [facility])    ED Discharge Orders    None     Portions of this note were generated with Dragon dictation software. Dictation errors may occur despite best attempts at proofreading.    Delia Heady, PA-C 03/18/19 1826    Drenda Freeze, MD 03/20/19 [phone]

## 2019-03-18 NOTE — ED Triage Notes (Signed)
Pt reports he noticed some penile swelling about a week ago. Pt denies any penile discharge or pain. Pt denies any problems with urination.

## 2019-03-18 NOTE — ED Provider Notes (Signed)
[facility]    CSN: [id number] Arrival date & time: 03/18/19  1159      History   Chief Complaint Chief Complaint  Patient presents with  . Groin Swelling    HPI [name] [name] is a 85 y.o. male.   Patient is a 85 year old male with past medical history of chronic kidney disease, diabetes, DVT, gout, hypertension, lymphadenitis, psoriasis, CHF, A. fib, hyperkalemia, stasis ulcer, bilateral AKA.  He presents today with penile and testicle swelling.  Symptoms have been constant and worsening over the past few weeks.  Patient reporting that he has been cutting back on his Lasix.  Having trouble urinating due to the significant swelling.  Denies any pain, fevers, abdominal pain or nausea.  No chest pain or shortness of breath.  ROS per HPI      Past Medical History:  Diagnosis Date  . Chronic kidney disease    RENAL INSUFICCIENCY  . Diabetes mellitus without complication ([place])   . DVT (deep venous thrombosis) ([place])   . Gout   . Hypertension   . Lymphadenitis 08/10/2012  . Psoriasis     Patient Active Problem List   Diagnosis Date Noted  . Acute on chronic systolic CHF (congestive heart failure) ([place]) 11/23/2018  . Troponin level elevated 11/23/2018  . Acute on chronic heart failure ([place])   . Atrial fibrillation and flutter ([place])   . Atrial fibrillation with RVR ([place]) 07/15/2018  . S/P AKA (above knee amputation) bilateral ([place])   . Pressure injury of skin 10/04/2017  . Hyperkalemia 11/17/2016  . Venous stasis ulcer ([place]) 11/17/2016  . Venous stasis ulcer of lower extremity ([place]) 05/23/2015  . Cellulitis of both lower extremities 10/18/2014  . Chronic renal insufficiency, stage 3 (moderate) ([place]) 10/18/2014  . HTN (hypertension), benign 10/18/2014  . Chronic diastolic CHF (congestive heart failure) ([place]) 10/18/2014  . Lower extremity cellulitis 10/18/2014  . PAD (peripheral artery disease) ([place]) 02/02/2014  . Atherosclerosis of native arteries of the  extremities with ulceration ([place]) 07/22/2013  . Acute renal failure ([place]) 11/15/2012  . Fall 11/15/2012  . Diabetic ulcer of left foot ([place]) 10/15/2012  . Diabetes mellitus ([place]) 10/15/2012  . Lymphadenitis 08/07/2012  . Cellulitis 08/07/2012  . HTN (hypertension) 08/05/2012  . Renal insufficiency 08/05/2012  . Tobacco abuse 08/05/2012  . Gout 08/05/2012  . Hyperglycemia 08/05/2012    Past Surgical History:  Procedure Laterality Date  . AMPUTATION Bilateral 10/08/2017   Procedure: Bilateral  ABOVE KNEE amputations;  Surgeon: Conrad [place], MD;  Location: [facility] OR;  Service: Vascular;  Laterality: Bilateral;       Home Medications    Prior to Admission medications   Medication Sig Start Date End Date Taking? Authorizing Provider  apixaban (ELIQUIS) 5 MG TABS tablet Take 1 tablet (5 mg total) by mouth 2 (two) times daily. 10/08/18   Fulp, Cammie, MD  fluocinolone (SYNALAR) 0.025 % cream Apply topically 2 (two) times daily. To the scalp and sparingly to the face 01/12/19   Fulp, Cammie, MD  furosemide (LASIX) 40 MG tablet Take 1 tablet (40 mg total) by mouth daily. 10/08/18   Fulp, Cammie, MD  gabapentin (NEURONTIN) 100 MG capsule PATIENT TAKES 2 CAPSULES IN THE MORNING AND 2 CAPSULES EVERY EVENING AND 3 CAPSULES AT BEDTIME TIME 02/11/19   Fulp, Cammie, MD  hydrocortisone cream 1 % Apply topically 2 (two) times daily. 11/25/18   Shelly Coss, MD  losartan (COZAAR) 50 MG tablet Take 1 tablet (50 mg total) by  mouth daily. 10/08/18   Fulp, Cammie, MD  metoprolol succinate (TOPROL-XL) 100 MG 24 hr tablet TAKE 1 TABLET BY MOUTH ONCE DAILY WITH OR IMMEDIATELY FOLLOWING A MEAL 03/14/19   Fulp, Cammie, MD  potassium chloride 20 MEQ TBCR Take 20 mEq by mouth daily. 11/25/18 12/25/18  Shelly Coss, MD    Family History Family History  Problem Relation Age of Onset  . Hypertension Mother   . Stroke Mother   . Hypertension Father     Social History Social History   Tobacco Use  . Smoking  status: Former Smoker    Years: 40.00    Types: Pipe  . Smokeless tobacco: Never Used  Substance Use Topics  . Alcohol use: No  . Drug use: No     Allergies   Patient has no known allergies.   Review of Systems Review of Systems   Physical Exam Triage Vital Signs ED Triage Vitals  Enc Vitals Group     BP 03/18/19 1242 123/76     Pulse Rate 03/18/19 1242 (!) 52     Resp 03/18/19 1242 18     Temp 03/18/19 1242 98.1 F (36.7 C)     Temp Source 03/18/19 1242 Oral     SpO2 03/18/19 1242 100 %     Weight --      Height --      Head Circumference --      Peak Flow --      Pain Score 03/18/19 1248 0     Pain Loc --      Pain Edu? --      Excl. in [place]? --    No data found.  Updated Vital Signs BP 123/76 (BP Location: Left Arm)   Pulse (!) 52   Temp 98.1 F (36.7 C) (Oral)   Resp 18   SpO2 100%   Visual Acuity Right Eye Distance:   Left Eye Distance:   Bilateral Distance:    Right Eye Near:   Left Eye Near:    Bilateral Near:     Physical Exam Vitals signs and nursing note reviewed.  Constitutional:      General: He is not in acute distress.    Appearance: Normal appearance. He is not ill-appearing, toxic-appearing or diaphoretic.     Comments: Poor historian   HENT:     Head: Normocephalic and atraumatic.     Nose: Nose normal.  Neck:     Musculoskeletal: Normal range of motion.  Pulmonary:     Effort: Pulmonary effort is normal.  Genitourinary:    Comments: Significant penile and testicle swelling.  Nontender to touch.  Neurological:     Mental Status: He is alert.  Psychiatric:        Mood and Affect: Mood normal.      UC Treatments / Results  Labs (all labs ordered are listed, but only abnormal results are displayed) Labs Reviewed - No data to display  EKG   Radiology No results found.  Procedures Procedures (including critical care time)  Medications Ordered in UC Medications - No data to display  Initial Impression /  Assessment and Plan / UC Course  I have reviewed the triage vital signs and the nursing notes.  Pertinent labs & imaging results that were available during my care of the patient were reviewed by me and considered in my medical decision making (see chart for details).     Testicle and penile swelling.  Most likely due to fluid overload.  Sending patient to the ER for further evaluation Final Clinical Impressions(s) / UC Diagnoses   Final diagnoses:  Testicle swelling  Penile swelling     Discharge Instructions     You need to go to the ER for IV lasix     ED Prescriptions    None     PDMP not reviewed this encounter.   Loura Halt A, NP 03/18/19 1326

## 2019-03-18 NOTE — Discharge Instructions (Addendum)
You need to go to the ER for IV lasix

## 2019-03-18 NOTE — ED Triage Notes (Addendum)
Patient presents to Urgent Care with complaints of penile swelling since possibly a week ago, patient is not sure. Patient reports he has CHF, has been decreasing his "salt and fluid pill". Patient denies urinary symptoms.

## 2019-03-18 NOTE — H&P (Addendum)
Date: 03/18/2019               Patient Name:  [name] [name] MRN: [id number]  DOB: August 14, 1926 Age / Sex: 85 y.o., male   PCP: [name], MD         Medical Service: Internal Medicine Teaching Service         Attending Physician: Dr. Darl Householder, Lujean Rave, MD    First Contact: Dr. Court Joy Pager: [phone]  Second Contact: Dr. Koleen Distance Pager: [phone]       After Hours (After 5p/  First Contact Pager: [phone]  weekends / holidays): Second Contact Pager: [phone]   Chief Complaint: Penile and scrotal swelling  History of Present Illness: [name] [name] is a 85 year old male with a past medical history significant for DVT, psoriasis, T2DM, HTN, bilateral AKA's, HFrEF (EF 40-45% 11/2018), who presented to the ED with nonpainful testicular and penile swelling for the past 1 to 2 weeks. The patient is prescribed oral Lasix 40 mg daily, however the patient has had constant urination, so he started taking it every other day prior to his testicular and penile swelling. Patient is unsure if his weight is changed, because he does not have a scale at home.  Upon chart review, dry weight appears to be 77 kg.  Patient has not been weighed yet.  He denies any headache, vision changes, chest pain, shortness of breath, abdominal pain, changes in his bowel or bladder habits with numbness and tingling any extremities.  In the ED, a CBC, BMP, BNP, UA and UC were drawn. CBC unremarkable. BMP demonstrated elevated creatinine to 1.48, which appears to be the patient's baseline.  BNP was significantly elevated to 2136.8.  CXR was notable for vascular congestion with moderate left effusion and left lower lobe atelectasis. X-ray of the pelvis was also obtained and was unremarkable. Patient received a dose of 40 mg IV Lasix.  Meds:  Current Meds  Medication Sig  . apixaban (ELIQUIS) 5 MG TABS tablet Take 1 tablet (5 mg total) by mouth 2 (two) times daily.  . fluocinolone (SYNALAR) 0.025 % cream Apply topically 2 (two) times  daily. To the scalp and sparingly to the face  . furosemide (LASIX) 40 MG tablet Take 1 tablet (40 mg total) by mouth daily.  Marland Kitchen gabapentin (NEURONTIN) 100 MG capsule PATIENT TAKES 2 CAPSULES IN THE MORNING AND 2 CAPSULES EVERY EVENING AND 3 CAPSULES AT BEDTIME TIME (Patient taking differently: Take 100 mg by mouth See admin instructions. PATIENT TAKES 2 CAPSULES IN THE MORNING AND 2 CAPSULES EVERY EVENING AND 3 CAPSULES AT BEDTIME TIME)  . hydrocortisone cream 1 % Apply topically 2 (two) times daily.  Marland Kitchen losartan (COZAAR) 50 MG tablet Take 1 tablet (50 mg total) by mouth daily.  . metoprolol succinate (TOPROL-XL) 100 MG 24 hr tablet TAKE 1 TABLET BY MOUTH ONCE DAILY WITH OR IMMEDIATELY FOLLOWING A MEAL (Patient taking differently: Take 100 mg by mouth daily. )  . potassium chloride 20 MEQ TBCR Take 20 mEq by mouth daily.   Allergies: Allergies as of 03/18/2019  . (No Known Allergies)   Past Medical History:  Diagnosis Date  . Chronic kidney disease    RENAL INSUFICCIENCY  . Diabetes mellitus without complication ([place])   . DVT (deep venous thrombosis) ([place])   . Gout   . Hypertension   . Lymphadenitis 08/10/2012  . Psoriasis    Past Surgical History:  Procedure Laterality Date  . AMPUTATION Bilateral 10/08/2017   Procedure: Bilateral  ABOVE KNEE amputations;  Surgeon: Conrad Howard, MD;  Location: [facility] OR;  Service: Vascular;  Laterality: Bilateral;    Family History:  Family History  Problem Relation Age of Onset  . Hypertension Mother   . Stroke Mother   . Hypertension Father    Social History:  - Lives with his daughter, who helps him takes his medications - Ambulates with power wheelchair - Used to smoke a pipe 6-7 times a day for "a long time".  Stopped 10 years ago - Denies current alcohol use - No sick contacts  Review of Systems: A complete ROS was negative except as per HPI.   Imaging: EKG: Sinus or ectopic atrial rhythm  CXR:  IMPRESSION: Vascular congestion with  moderate left effusion and left lower lobe Atelectasis.  Pelvis XR: FINDINGS: Negative for fracture or focal bone lesion. Degenerative change in the lumbar spine. Hip joints appear normal for age.  Physical Exam: Blood pressure (!) 130/95, pulse 67, temperature 97.7 F (36.5 C), temperature source Oral, resp. rate (!) 8, SpO2 100 %.  Physical Exam Vitals signs reviewed.  Constitutional:      General: He is not in acute distress.    Appearance: He is normal weight. He is not toxic-appearing.  HENT:     Head: Atraumatic.     Comments: Raised, flaking rash on the on the crown of the head Eyes:     General: No scleral icterus.       Right eye: No discharge.        Left eye: No discharge.     Extraocular Movements: Extraocular movements intact.     Conjunctiva/sclera: Conjunctivae normal.  Cardiovascular:     Rate and Rhythm: Normal rate and regular rhythm.     Heart sounds: Normal heart sounds. No murmur. No friction rub. No gallop.      Comments: No JVD  Minimal hepatojugular reflux Pulmonary:     Effort: Pulmonary effort is normal. No respiratory distress.     Breath sounds: Normal breath sounds. No wheezing or rales.  Abdominal:     General: Abdomen is flat. Bowel sounds are normal. There is no distension.     Palpations: Abdomen is soft.     Tenderness: There is no abdominal tenderness. There is no guarding.  Genitourinary:    Comments: Swollen, edematous penis and testicles. No skin break down noted. Diffusely tender to palpation   No masses Musculoskeletal:     Comments: Bilateral AKA  2+ pitting edema  Skin:    General: Skin is warm.     Findings: Rash (diffuse scaling plaques on the torso,and hips) present.  Neurological:     General: No focal deficit present.     Mental Status: He is alert and oriented to person, place, and time.     Sensory: No sensory deficit.  Psychiatric:        Mood and Affect: Mood normal.        Behavior: Behavior normal.     Assessment & Plan by Problem: Active Problems:   CHF exacerbation ([place])  In summary, [name] [name] is a 85 year old male with a past medical history of DVT, psoriasis, T2DM, HTN, bilateral AKA's, HFrEF (EF 40-45% 11/2018) who presented with testicular and penile swelling for the past 1 to 2 weeks. This is in the context of decreasing his Lasix dose to every other day due to constant urination.  His presentation is most consistent with a CHF exacerbation.  #Scrotal and penile swelling #HFrEF exacerbation (  EF 40-45% 11/2018): Patient states he stopped taking his Lasix 40 mg daily, and started doing it every other day for the last 1 to 2 weeks due to constant urination. Dry weight 77 kg on chart review. Current weight has not been measured yet. - IV Lasix 40 mg twice daily - Strict ins and outs - Daily weights - Consider echo in AM, but presentation is likely due to medication adherence and does not appear to be worsening CHF.  #T2DM: Last HgbA1c 5.8 4 months ago.  Patient takes does not take any diabetes medications at this time. - Carb modified diet  #HTN: Patient takes metoprolol 100 mg daily and losartan 50 mg daily - Continue home metoprolol 100 mg daily - Continue home losartan 50 mg daily  #Psoriasis - Fluocinolone ointment to scalp and face twice daily - Hydrocortisone 1% cream apply twice daily - Triamcinolone apply twice daily  #FEN/GI - Diet: Carb modified heart healthy, 1800 mL fluid restriction - Fluids: None  #DVT Prophylaxis - Continue home Eliquis 5 mg daily  #CODE STATUS: Partial; No CPR or cardiac shocks, but ok with intubation   #Dispo: Admit patient to Inpatient with expected length of stay greater than 2 midnights.  Signed: Earlene Plater, MD Internal Medicine, PGY1 Pager: [phone]  03/18/2019,9:18 PM

## 2019-03-19 DIAGNOSIS — I110 Hypertensive heart disease with heart failure: Secondary | ICD-10-CM

## 2019-03-19 DIAGNOSIS — Z87891 Personal history of nicotine dependence: Secondary | ICD-10-CM

## 2019-03-19 DIAGNOSIS — Z79899 Other long term (current) drug therapy: Secondary | ICD-10-CM

## 2019-03-19 DIAGNOSIS — Z86718 Personal history of other venous thrombosis and embolism: Secondary | ICD-10-CM

## 2019-03-19 DIAGNOSIS — Z7901 Long term (current) use of anticoagulants: Secondary | ICD-10-CM

## 2019-03-19 DIAGNOSIS — L409 Psoriasis, unspecified: Secondary | ICD-10-CM

## 2019-03-19 DIAGNOSIS — E1151 Type 2 diabetes mellitus with diabetic peripheral angiopathy without gangrene: Secondary | ICD-10-CM

## 2019-03-19 DIAGNOSIS — Z89611 Acquired absence of right leg above knee: Secondary | ICD-10-CM

## 2019-03-19 DIAGNOSIS — Z89612 Acquired absence of left leg above knee: Secondary | ICD-10-CM

## 2019-03-19 LAB — BASIC METABOLIC PANEL
Anion gap: 10 (ref 5–15)
BUN: 18 mg/dL (ref 8–23)
CO2: 26 mmol/L (ref 22–32)
Calcium: 8.8 mg/dL — ABNORMAL LOW (ref 8.9–10.3)
Chloride: 105 mmol/L (ref 98–111)
Creatinine, Ser: 1.37 mg/dL — ABNORMAL HIGH (ref 0.61–1.24)
GFR calc Af Amer: 52 mL/min — ABNORMAL LOW (ref >60)
GFR calc non Af Amer: 45 mL/min — ABNORMAL LOW (ref >60)
Glucose, Bld: 95 mg/dL (ref 70–99)
Potassium: 3.8 mmol/L (ref 3.5–5.1)
Sodium: 141 mmol/L (ref 135–145)

## 2019-03-19 NOTE — ED Notes (Signed)
Ordered breakfast--[name] [name] 

## 2019-03-19 NOTE — Progress Notes (Signed)
   Subjective:  [name] [name] is a 85 y.o. M with PMH of DVT, psoriasis, T2DM, HTN, bilateral AKA, HFrEF (40-45%) admit for scrotal swelling on hospital day 1  [name] [name] was examined and evaluated in ED. He was observed sleeping comfortably in bed this AM. He mentions that he has been taking his diuretic every other day instead of every day due to urinary frequency caused by the med and he noticed significant scrotal swelling. Denies any significant tenderness. Denies any episodes of chest pain, palpitations or dyspnea. Since admission he states his scrotum appeared to have decreased in size.  Objective:  Vital signs in last 24 hours: Vitals:   03/19/19 0145 03/19/19 0416 03/19/19 0515 03/19/19 0630  BP: 140/80 124/76 118/77 128/84  Pulse: (!) 49 (!) 49 (!) 48 (!) 48  Resp: (!) 26 12 19 12   Temp:      TempSrc:      SpO2:  100% 100% 100%   Gen: Well-developed, well nourished, NAD HEENT: NCAT head, hearing intact, EOMI Neck: supple, ROM intact, no JVD CV: RRR, S1, S2 normal, No rubs, no murmurs, no gallops Pulm: CTAB, No rales, no wheezes Abd: Soft, BS+, Non tender, + Abdominal wall edema Extm: Bilateral AKA w/ 3+ pitting edema GU: Significant scrotal edema Skin: Dry, Warm, normal turgor, multiple areas of psoriatic lesions on scalp, chest, arm, groin, and legs Neuro: AAOx3  Assessment/Plan:  Active Problems:   CHF exacerbation (HCC)  Acute on chronic systolic heart failure exacerbation EF 40-45% 11/2018. BNP >2000. Significant scrotal and lower extremity swelling on exam. On furosemide 40mg  daily at home. 2L urine output overnight w/ IV lasix 40. Unclear dry weight (77kg?). Weight on admission not measured  Yet. - C/w IV furosemide 40mg  BID - Trend BMP - Mag level - Strict I&Os - Daily Weights - Fluid restriction - Keep O2 sat >88 - Replenish K as needed >4.0 - C/w home meds: losartan 50mg  daily  A.fib w/ RVR Prior hx of A.fib w/ RVR. Currently in sinus. On metoprolol at home.  CHADSVASC of 5 due to CHF, age, artery disease. - C/w Eliquis - Holding metoprolol due to bradycardia  HTN On metoprolol and losartan at home. Bp this am 114/70 - Home meds  Psoriasis On hydrocortisone cream at home - Kenalog BID  DVT prophx: Eliquis Diet: HH Bowel: Senokot Code: partial  Dispo: Anticipated discharge once he can no longer be diuresed  Mosetta Anis, MD 03/19/2019, 6:51 AM Pager: [phone]

## 2019-03-19 NOTE — ED Notes (Addendum)
ED TO INPATIENT HANDOFF REPORT  ED Nurse Name and Phone #: Carlis Abbott [phone]  S Name/Age/Gender [name] [name] 85 y.o. male Room/Bed: 029C/029C  Code Status   Code Status: Partial Code  Home/SNF/Other Home Patient oriented to: self, place, time and situation Is this baseline? Yes   Triage Complete: Triage complete  Chief Complaint Penile Swelling  Triage Note Pt reports he noticed some penile swelling about a week ago. Pt denies any penile discharge or pain. Pt denies any problems with urination.    Allergies No Known Allergies  Level of Care/Admitting Diagnosis ED Disposition    ED Disposition Condition [facility] Area: [place] [[id number]]  Level of Care: Telemetry Cardiac [103]  Covid Evaluation: Asymptomatic Screening Protocol (No Symptoms)  [facility]: [id number]  Admitting Physician: Aldine Contes [phone]  Attending Physician: Aldine Contes [phone]  Estimated length of stay: past midnight tomorrow  Certification:: I certify this patient will need inpatient services for at least 2 midnights  PT Class (Do Not Modify): Inpatient [101]  PT Acc Code (Do Not Modify): Private [1]       B Medical/Surgery History Past Medical History:  Diagnosis Date  . Chronic kidney disease    RENAL INSUFICCIENCY  . Diabetes mellitus without complication ([place])   . DVT (deep venous thrombosis) ([place])   . Gout   . Hypertension   . Lymphadenitis 08/10/2012  . Psoriasis    Past Surgical History:  Procedure Laterality Date  . AMPUTATION Bilateral 10/08/2017   Procedure: Bilateral  ABOVE KNEE amputations;  Surgeon: Conrad [place]ama, MD;  Location: [facility] OR;  Service: Vascular;  Laterality: Bilateral;     A IV Location/Drains/Wounds Patient Lines/Drains/Airways Status   Active Line/Drains/Airways    Name:   Placement date:   Placement time:   Site:   Days:   Peripheral IV 03/18/19 Left Antecubital   03/18/19    1604     Antecubital   1   Wound / Incision (Open or Dehisced) 07/17/18 Coccyx Mid   07/17/18    1217    Coccyx   245          Intake/Output Last 24 hours  Intake/Output Summary (Last 24 hours) at 03/19/2019 1113 Last data filed at 03/19/2019 1005 Gross per 24 hour  Intake -  Output 1600 ml  Net -1600 ml    Labs/Imaging Results for orders placed or performed during the hospital encounter of 03/18/19 (from the past 48 hour(s))  Basic metabolic panel     Status: Abnormal   Collection Time: 03/18/19  3:37 PM  Result Value Ref Range   Sodium 141 135 - 145 mmol/L   Potassium 3.5 3.5 - 5.1 mmol/L   Chloride 104 98 - 111 mmol/L   CO2 27 22 - 32 mmol/L   Glucose, Bld 128 (H) 70 - 99 mg/dL   BUN 19 8 - 23 mg/dL   Creatinine, Ser 1.48 (H) 0.61 - 1.24 mg/dL   Calcium 8.8 (L) 8.9 - 10.3 mg/dL   GFR calc non Af Amer 41 (L) >60 mL/min   GFR calc Af Amer 48 (L) >60 mL/min   Anion gap 10 5 - 15    Comment: Performed at [facility], [address], [place], [place] [address]  CBC with Differential     Status: Abnormal   Collection Time: 03/18/19  3:37 PM  Result Value Ref Range   WBC 3.4 (L) 4.0 - 10.5 K/uL  RBC 4.18 (L) 4.22 - 5.81 MIL/uL   Hemoglobin 13.8 13.0 - 17.0 g/dL   HCT 42.7 39.0 - 52.0 %   MCV 102.2 (H) 80.0 - 100.0 fL   MCH 33.0 26.0 - 34.0 pg   MCHC 32.3 30.0 - 36.0 g/dL   RDW 16.1 (H) 11.5 - 15.5 %   Platelets 162 150 - 400 K/uL   nRBC 0.0 0.0 - 0.2 %   Neutrophils Relative % 59 %   Neutro Abs 2.0 1.7 - 7.7 K/uL   Lymphocytes Relative 24 %   Lymphs Abs 0.8 0.7 - 4.0 K/uL   Monocytes Relative 14 %   Monocytes Absolute 0.5 0.1 - 1.0 K/uL   Eosinophils Relative 2 %   Eosinophils Absolute 0.1 0.0 - 0.5 K/uL   Basophils Relative 1 %   Basophils Absolute 0.0 0.0 - 0.1 K/uL   Immature Granulocytes 0 %   Abs Immature Granulocytes 0.01 0.00 - 0.07 K/uL    Comment: Performed at [facility] [address] [place], [place] [address]  Brain natriuretic peptide      Status: Abnormal   Collection Time: 03/18/19  3:37 PM  Result Value Ref Range   B Natriuretic Peptide 2,136.8 (H) 0.0 - 100.0 pg/mL    Comment: Performed at [facility] [address] [place], [place] [address]  SARS CORONAVIRUS 2 (TAT 6-24 HRS) Nasopharyngeal Nasopharyngeal Swab     Status: None   Collection Time: 03/18/19  5:13 PM   Specimen: Nasopharyngeal Swab  Result Value Ref Range   SARS Coronavirus 2 NEGATIVE NEGATIVE    Comment: (NOTE) SARS-CoV-2 target nucleic acids are NOT DETECTED. The SARS-CoV-2 RNA is generally detectable in upper and lower respiratory specimens during the acute phase of infection. Negative results do not preclude SARS-CoV-2 infection, do not rule out co-infections with other pathogens, and should not be used as the sole basis for treatment or other patient management decisions. Negative results must be combined with clinical observations, patient history, and epidemiological information. The expected result is Negative. Fact Sheet for Patients: SugarRoll.be Fact Sheet for Healthcare Providers: https://www.woods-mathews.com/ This test is not yet approved or cleared by the [place] FDA and  has been authorized for detection and/or diagnosis of SARS-CoV-2 by FDA under an Emergency Use Authorization (EUA). This EUA will remain  in effect (meaning this test can be used) for the duration of the COVID-19 declaration under Section 56 4(b)(1) of the Act, 21 U.S.C. section 360bbb-3(b)(1), unless the authorization is terminated or revoked sooner. Performed at [facility], [place] [address], [place], [place] [address]   Urinalysis, Routine w reflex microscopic     Status: Abnormal   Collection Time: 03/18/19  6:34 PM  Result Value Ref Range   Color, Urine YELLOW YELLOW   APPearance HAZY (A) CLEAR   Specific Gravity, Urine 1.006 1.005 - 1.030   pH 6.0 5.0 - 8.0   Glucose, UA NEGATIVE NEGATIVE mg/dL    Hgb urine dipstick MODERATE (A) NEGATIVE   Bilirubin Urine NEGATIVE NEGATIVE   Ketones, ur NEGATIVE NEGATIVE mg/dL   Protein, ur NEGATIVE NEGATIVE mg/dL   Nitrite NEGATIVE NEGATIVE   Leukocytes,Ua LARGE (A) NEGATIVE   RBC / HPF 6-10 0 - 5 RBC/hpf   WBC, UA >50 (H) 0 - 5 WBC/hpf   Bacteria, UA MANY (A) NONE SEEN   Mucus PRESENT     Comment: Performed at [facility] [address], [place], [place] [address]  Basic metabolic panel     Status: Abnormal   Collection Time: 03/19/19  3:22 AM  Result Value Ref Range   Sodium 141 135 - 145 mmol/L   Potassium 3.8 3.5 - 5.1 mmol/L   Chloride 105 98 - 111 mmol/L   CO2 26 22 - 32 mmol/L   Glucose, Bld 95 70 - 99 mg/dL   BUN 18 8 - 23 mg/dL   Creatinine, Ser 1.37 (H) 0.61 - 1.24 mg/dL   Calcium 8.8 (L) 8.9 - 10.3 mg/dL   GFR calc non Af Amer 45 (L) >60 mL/min   GFR calc Af Amer 52 (L) >60 mL/min   Anion gap 10 5 - 15    Comment: Performed at [facility] [address] [place], [place] [address]   Dg Pelvis Portable  Result Date: 03/18/2019 CLINICAL DATA:  Groin pain, penile swelling. EXAM: PORTABLE PELVIS 1-2 VIEWS COMPARISON:  11/15/2012 FINDINGS: Negative for fracture or focal bone lesion. Degenerative change in the lumbar spine. Hip joints appear normal for age. IMPRESSION: Negative. Electronically Signed   By: Franchot Gallo M.D.   On: 03/18/2019 16:32   Dg Chest Portable 1 View  Result Date: 03/18/2019 CLINICAL DATA:  Fluid overload EXAM: PORTABLE CHEST 1 VIEW COMPARISON:  11/23/2018 FINDINGS: Cardiac enlargement with mild vascular congestion. Moderate left effusion and left lower lobe atelectasis with mild progression. No effusion on the right. IMPRESSION: Vascular congestion with moderate left effusion and left lower lobe atelectasis. Electronically Signed   By: Franchot Gallo M.D.   On: 03/18/2019 16:19    Pending Labs Unresulted Labs (From admission, onward)    Start     Ordered   03/18/19 1537  Urine culture   ONCE - STAT,   STAT     03/18/19 1536          Vitals/Pain Today's Vitals   03/19/19 1001 03/19/19 1003 03/19/19 1015 03/19/19 1107  BP: 114/70  120/74   Pulse:   (!) 53   Resp: 20  13   Temp:      TempSrc:      SpO2:  99% 100%   PainSc:    Asleep    Isolation Precautions No active isolations  Medications Medications  losartan (COZAAR) tablet 50 mg (50 mg Oral Given 03/19/19 0952)  metoprolol succinate (TOPROL-XL) 24 hr tablet 100 mg (0 mg Oral Hold 03/19/19 0953)  apixaban (ELIQUIS) tablet 5 mg (5 mg Oral Given 03/18/19 2137)  gabapentin (NEURONTIN) capsule 200 mg (200 mg Oral Given 03/19/19 0953)  triamcinolone (KENALOG) 0.025 % cream (has no administration in time range)  sodium chloride flush (NS) 0.9 % injection 3 mL (3 mLs Intravenous Given 03/18/19 2139)  acetaminophen (TYLENOL) tablet 650 mg (has no administration in time range)    Or  acetaminophen (TYLENOL) suppository 650 mg (has no administration in time range)  senna-docusate (Senokot-S) tablet 1 tablet (has no administration in time range)  ondansetron (ZOFRAN) tablet 4 mg (has no administration in time range)    Or  ondansetron (ZOFRAN) injection 4 mg (has no administration in time range)  furosemide (LASIX) injection 40 mg (40 mg Intravenous Given 03/19/19 0953)  gabapentin (NEURONTIN) capsule 300 mg (300 mg Oral Given 03/18/19 2138)  furosemide (LASIX) injection 40 mg (40 mg Intravenous Given 03/18/19 1704)  potassium chloride SA (KLOR-CON) CR tablet 40 mEq (40 mEq Oral Given 03/18/19 2136)    Mobility power wheelchair Moderate fall risk   Focused Assessments Cardiac Assessment Handoff:  Lab Results  Component Value Date   CKTOTAL 143 10/05/2017   CKMB 5.9 (H) 11/15/2012   TROPONINI 0.10 (HH) 07/16/2018   Lab Results  Component Value Date   DDIMER 3.39 (H) 11/16/2012   Does the Patient currently have chest pain? No  , Pulmonary Assessment Handoff:  Lung sounds:   O2 Device: Room  Air        R Recommendations: See Admitting Provider Note  Report given to: Gwen  Additional Notes: Patient admitted for CHF exacerbation after he stopped taking his lasix regularly because of how often it made him urinate. Patient denies shortness of breath, denies chest pain. Output 33mL since 0700 today. BIL AKA.

## 2019-03-20 LAB — BASIC METABOLIC PANEL
Anion gap: 10 (ref 5–15)
BUN: 18 mg/dL (ref 8–23)
CO2: 30 mmol/L (ref 22–32)
Calcium: 8.5 mg/dL — ABNORMAL LOW (ref 8.9–10.3)
Chloride: 100 mmol/L (ref 98–111)
Creatinine, Ser: 1.34 mg/dL — ABNORMAL HIGH (ref 0.61–1.24)
GFR calc Af Amer: 54 mL/min — ABNORMAL LOW (ref >60)
GFR calc non Af Amer: 46 mL/min — ABNORMAL LOW (ref >60)
Glucose, Bld: 88 mg/dL (ref 70–99)
Potassium: 3.7 mmol/L (ref 3.5–5.1)
Sodium: 140 mmol/L (ref 135–145)

## 2019-03-20 LAB — MAGNESIUM: Magnesium: 1.9 mg/dL (ref 1.7–2.4)

## 2019-03-20 NOTE — Progress Notes (Signed)
Occupational Therapy Treatment Patient Details Name: Elisandro Wyka MRN: [id number] DOB: 11-14-1926 Today's Date: 03/20/2019    History of present illness Patient is a 85 y/o male who presents with testicular and penile swelling, BNP >2000. CXR-moderate pulmonary vascular congestion. Admitted with acute on chronic heart failure exacerbation.  PMH includes PVD s/p bil AKAs, HTN, DM, gout, CKD, DVT, heart failure.   OT comments  OTR donning scrotal sling. Swelling noted, no skin breakdown noted. Demonstration for RN provided. NT at lunch. Sign for wear instructions and how to donn/doff near pt's board in room. Pt education on sling. MD sent message and order placed for sling to be worn at all times. Pt reports "It feels more supported." OT to continue to check on pt for ADL and sling needs. OT following acutely.   Follow Up Recommendations  Home health OT;Supervision - Intermittent    Equipment Recommendations  None recommended by OT    Recommendations for Other Services      Precautions / Restrictions Precautions Precautions: Fall;Other (comment) Precaution Comments: BiL AKAs Restrictions Weight Bearing Restrictions: No       Mobility Bed Mobility Overal bed mobility: Needs Assistance Bed Mobility: Supine to Sit     Supine to sit: Supervision;HOB elevated     General bed mobility comments: Long sitting without physical assist  Transfers Overall transfer level: Needs assistance Equipment used: None Transfers: Comptroller transfers: +2 physical assistance;Mod assist   General transfer comment: Able to perform AP transfer to recliner with setup and Mod A for scooting esp when fatigued using chuck pad. Not able to transfer to motorized w/c due to needing slide board and no batteries.    Balance Overall balance assessment: Needs assistance Sitting-balance support: Bilateral upper extremity supported Sitting balance-Leahy Scale:  Fair                                     ADL either performed or assessed with clinical judgement   ADL Overall ADL's : Needs assistance/impaired Eating/Feeding: Modified independent;Sitting   Grooming: Set up;Sitting   Upper Body Bathing: Set up;Sitting   Lower Body Bathing: Moderate assistance;Sitting/lateral leans   Upper Body Dressing : Set up;Sitting   Lower Body Dressing: Maximal assistance;Sitting/lateral leans   Toilet Transfer: Moderate assistance;+2 for physical assistance;Anterior/posterior;Cueing for safety   Toileting- Clothing Manipulation and Hygiene: Moderate assistance;Cueing for safety;Sitting/lateral lean;Bed level       Functional mobility during ADLs: Moderate assistance;+2 for physical assistance;Cueing for safety General ADL Comments: OTR donning scrotal sling. Demonstration for RN provided. NT at lunch. Sign near pt's board in room. Pt education on sling. MD sent message and order placed for sling to be worn at all times.     Vision Baseline Vision/History: No visual deficits Vision Assessment?: No apparent visual deficits   Perception     Praxis      Cognition Arousal/Alertness: Awake/alert Behavior During Therapy: WFL for tasks assessed/performed Overall Cognitive Status: No family/caregiver present to determine baseline cognitive functioning                                 General Comments: Oriented to year and situation, not month; HOH. Likely baseline cognition.        Exercises     Shoulder Instructions       General Comments Scrotal  sling provided.    Pertinent Vitals/ Pain       Pain Assessment: No/denies pain  Home Living Family/patient expects to be discharged to:: Private residence Living Arrangements: Children Available Help at Discharge: Family;Available PRN/intermittently Type of Home: House Home Access: Ramped entrance     Home Layout: One level     Bathroom Shower/Tub: Other  (comment);Tub/shower unit         Home Equipment: Transport planner;Other (comment);Bedside commode   Additional Comments: reports "I was planning on getting my legs soon."      Prior Functioning/Environment Level of Independence: Needs assistance  Gait / Transfers Assistance Needed: power w/c for mobility; sliding board for car transfers; AP transfers otherwise ADL's / Homemaking Assistance Needed: Pt able to perform UB ADL and requires assist for successful LB ADL.       Frequency  Min 2X/week        Progress Toward Goals  OT Goals(current goals can now be found in the care plan section)  Progress towards OT goals: Progressing toward goals  Acute Rehab OT Goals Patient Stated Goal: to get some legs OT Goal Formulation: With patient Time For Goal Achievement: 04/03/19 Potential to Achieve Goals: Good  Plan Discharge plan remains appropriate    Co-evaluation    PT/OT/SLP Co-Evaluation/Treatment: Yes Reason for Co-Treatment: To address functional/ADL transfers   OT goals addressed during session: ADL's and self-care      AM-PAC OT "6 Clicks" Daily Activity     Outcome Measure   Help from another person eating meals?: None Help from another person taking care of personal grooming?: None Help from another person toileting, which includes using toliet, bedpan, or urinal?: A Little Help from another person bathing (including washing, rinsing, drying)?: A Little Help from another person to put on and taking off regular upper body clothing?: A Little Help from another person to put on and taking off regular lower body clothing?: A Little 6 Click Score: 20    End of Session    OT Visit Diagnosis: Muscle weakness (generalized) (M62.81)   Activity Tolerance Patient tolerated treatment well   Patient Left in chair;with call bell/phone within reach;with chair alarm set   Nurse Communication Mobility status        Time: [id number] OT Time Calculation (min): 20  min  Charges: OT General Charges $OT Visit: 1 Visit OT Evaluation $OT Eval Moderate Complexity: 1 Mod OT Treatments $Self Care/Home Management : 8-22 mins  Ebony Hail Harold Hedge) Marsa Aris OTR/L Acute Rehabilitation Services Pager: [phone] Office: [place] 03/20/2019, 3:21 PM

## 2019-03-20 NOTE — Evaluation (Signed)
Physical Therapy Evaluation Patient Details Name: [name] [name] MRN: [id number] DOB: [birth year] Today's Date: 03/20/2019   History of Present Illness  Patient is a 85 y/o male who presents with testicular and penile swelling, BNP >2000. CXR-moderate pulmonary vascular congestion. Admitted with acute on chronic heart failure exacerbation.  PMH includes PVD s/p bil AKAs, HTN, DM, gout, CKD, DVT, heart failure.  Clinical Impression  Patient presents with generalized weakness and impaired mobility s/p above. Pt reports requiring some assist for ADls at baseline but able to transfer to w/c and [facility] doing an AP transfer without assist. Eager to get some prosthetics. Today, pt requires Mod A of 2 for AP transfer to recliner. Not able to use motorized w/c due to not having batteries and needing slide board which pt reports he normally does not use. Wants to get therapy going again so he can get fitted for protheses at d/c. Will follow acutely to maximize independence and mobility prior to return home.    Follow Up Recommendations Home health PT;Supervision for mobility/OOB    Equipment Recommendations  None recommended by PT    Recommendations for Other Services       Precautions / Restrictions Precautions Precautions: Fall;Other (comment) Precaution Comments: BiL AKAs Restrictions Weight Bearing Restrictions: No      Mobility  Bed Mobility Overal bed mobility: Needs Assistance Bed Mobility: Supine to Sit     Supine to sit: Supervision;HOB elevated     General bed mobility comments: Able to come into long sitting without assist.  Transfers Overall transfer level: Needs assistance Equipment used: None Transfers: Comptroller transfers: +2 physical assistance;Mod assist   General transfer comment: Able to perform AP transfer to recliner with setup and Mod A for scooting esp when fatigued using chuck pad. Not able to transfer to motorized w/c  due to needing slide board and no batteries.  Ambulation/Gait             General Gait Details: Unable  Stairs            Wheelchair Mobility    Modified Rankin (Stroke Patients Only)       Balance Overall balance assessment: Needs assistance Sitting-balance support: Bilateral upper extremity supported Sitting balance-Leahy Scale: Fair                                       Pertinent Vitals/Pain Pain Assessment: No/denies pain    Home Living Family/patient expects to be discharged to:: Private residence Living Arrangements: Children Available Help at Discharge: Family;Available PRN/intermittently Type of Home: House Home Access: Ramped entrance     Home Layout: One level Home Equipment: Transport planner;Other (comment);Bedside commode Additional Comments: reports "I was planning on getting my legs soon."    Prior Function Level of Independence: Needs assistance   Gait / Transfers Assistance Needed: power w/c for mobility; sliding board for car transfers; AP transfers otherwise           Hand Dominance   Dominant Hand: Right    Extremity/Trunk Assessment   Upper Extremity Assessment Upper Extremity Assessment: Defer to OT evaluation    Lower Extremity Assessment Lower Extremity Assessment: RLE deficits/detail;LLE deficits/detail RLE Deficits / Details: AKA- good AROM in hip flexion and hip abd/add LLE Deficits / Details: AKA-good AROM in hip flexion and hip abd/add       Communication   Communication: [facility]  Cognition Arousal/Alertness: Awake/alert Behavior During Therapy: WFL for tasks assessed/performed Overall Cognitive Status: No family/caregiver present to determine baseline cognitive functioning                                 General Comments: Oriented to year and situation, not month; HOH. Likely baseline cognition.      General Comments General comments (skin integrity, edema, etc.): VSS; scrotal  swelling present. OT to make a sling.    Exercises     Assessment/Plan    PT Assessment Patient needs continued PT services  PT Problem List Decreased strength;Decreased mobility;Decreased balance       PT Treatment Interventions Therapeutic activities;Therapeutic exercise;Patient/family education;Balance training;Functional mobility training    PT Goals (Current goals can be found in the Care Plan section)  Acute Rehab PT Goals Patient Stated Goal: to get some legs so i can walk again PT Goal Formulation: With patient Time For Goal Achievement: 04/02/19 Potential to Achieve Goals: Fair    Frequency Min 3X/week   Barriers to discharge Decreased caregiver support      Co-evaluation PT/OT/SLP Co-Evaluation/Treatment: Yes Reason for Co-Treatment: To address functional/ADL transfers           AM-PAC PT "6 Clicks" Mobility  Outcome Measure Help needed turning from your back to your side while in a flat bed without using bedrails?: None Help needed moving from lying on your back to sitting on the side of a flat bed without using bedrails?: A Little Help needed moving to and from a bed to a chair (including a wheelchair)?: A Lot Help needed standing up from a chair using your arms (e.g., wheelchair or bedside chair)?: Total Help needed to walk in hospital room?: Total Help needed climbing 3-5 steps with a railing? : Total 6 Click Score: 12    End of Session   Activity Tolerance: Patient tolerated treatment well Patient left: in chair;with call bell/phone within reach;with chair alarm set Nurse Communication: Mobility status PT Visit Diagnosis: Muscle weakness (generalized) (M62.81)    Time: [id number] PT Time Calculation (min) (ACUTE ONLY): 23 min   Charges:   PT Evaluation $PT Eval Moderate Complexity: 1 Mod          Marisa Severin, PT, DPT Acute Rehabilitation Services Pager [phone] Office [place] 03/20/2019, 1:35  PM

## 2019-03-20 NOTE — Progress Notes (Addendum)
   Subjective: Pt seen at the bedside this morning. Doing well and eating breakfast. Endorses that his scrotal swelling is minimally improved since coming in. Has no acute concerns at this time.  Objective:  Vital signs in last 24 hours: Vitals:   03/19/19 1150 03/19/19 1150 03/19/19 2141 03/20/19 0610  BP: 119/82 119/82 104/62 105/62  Pulse: (!) 52 (!) 50 60 (!) 56  Resp: 15 18 18 18   Temp: 97.6 F (36.4 C) 97.8 F (36.6 C) (!) 97.5 F (36.4 C) 97.8 F (36.6 C)  TempSrc: Oral Oral Oral Oral  SpO2: 100% 100% 100% 99%  Weight:    85.3 kg   Physical Exam Vitals signs and nursing note reviewed.  Constitutional:      General: He is not in acute distress. Cardiovascular:     Rate and Rhythm: Normal rate and regular rhythm.     Heart sounds: Normal heart sounds.  Pulmonary:     Effort: Pulmonary effort is normal.     Comments: On RA. Bibasilar crackles on ausculation.  Genitourinary:    Comments: Penile and scrotal swelling significant thought improved from presentation. Musculoskeletal:     Comments: Bilateral AKA. Pitting edema bilaterally   Skin:    Comments: Psoriatic plaques over arms, back, and scalp.  Neurological:     Mental Status: He is alert.    Assessment/Plan:  Active Problems:   CHF exacerbation [facility])   [name] is a 85 year old male with a significant PMH of DVT, psoriasis, type 2 diabetes, hypertension, PAD status post bilateral AKA's, chronic systolic heart failure with an EF of 40 to 45% who presented with 1-2 week history of testicular and penile swelling, BNP >2000, moderate pulmonary vascular congestion on CXR, and bibasilar crackles on exam consistent with an acute on chronic heart failure exacerbation.   Acute on chronic systolic heart failure exacerbation Echo on 11/2018 with LVEF 40-45%, severe asymmetric LVH, moderately reduced RV systolic function, and moderate pulmonary HTN. Unclear dry weight, 77kg documented in the chart on 10/2018. Weight on  admission 85kg. - continue IV furosemide 40mg  BID - net negative 3L yesterday, urine output 3.3L - Strict I&Os with daily weights - 1817mL fluid restriction - monitor daily electrolytes - continue home losartan 50mg  daily  A.fib w/ RVR Currently in sinus, hx of A.fib w/ RVR. Rate control with metoprolol at home. CHADSVASC of 5. - continue home apixaban 5mg  BID - holding home metoprolol due to bradycardia  HTN - continue home losartan 50mg  daily - holding home metoprolol due to bradycardia  Psoriasis - triamcinolone 0.025% cream BID   Diet: heart healthy Fluids: none DVT ppx: home apixaban 5mg  BID CODE STATUS: PARTIAL  Dispo: Anticipated discharge pending diuresis and clinical improvement   Ladona Horns, MD 03/20/2019, 7:05 AM Pager: [phone]

## 2019-03-21 DIAGNOSIS — I272 Pulmonary hypertension, unspecified: Secondary | ICD-10-CM

## 2019-03-21 DIAGNOSIS — Z8679 Personal history of other diseases of the circulatory system: Secondary | ICD-10-CM

## 2019-03-21 LAB — BASIC METABOLIC PANEL
Anion gap: 9 (ref 5–15)
BUN: 21 mg/dL (ref 8–23)
CO2: 32 mmol/L (ref 22–32)
Calcium: 8.8 mg/dL — ABNORMAL LOW (ref 8.9–10.3)
Chloride: 99 mmol/L (ref 98–111)
Creatinine, Ser: 1.6 mg/dL — ABNORMAL HIGH (ref 0.61–1.24)
GFR calc Af Amer: 43 mL/min — ABNORMAL LOW (ref >60)
GFR calc non Af Amer: 37 mL/min — ABNORMAL LOW (ref >60)
Glucose, Bld: 96 mg/dL (ref 70–99)
Potassium: 3.8 mmol/L (ref 3.5–5.1)
Sodium: 140 mmol/L (ref 135–145)

## 2019-03-21 LAB — URINE CULTURE: Culture: >=100000 — AB

## 2019-03-21 NOTE — Discharge Instructions (Signed)
Information on my medicine - ELIQUIS (apixaban)  This medication education was reviewed with me or my healthcare representative as part of my discharge preparation.  The pharmacist that spoke with me during my hospital stay was:  Lyall Faciane T Hailly Fess, RPH  Why was Eliquis prescribed for you? Eliquis was prescribed for you to reduce the risk of forming blood clots that can cause a stroke if you have a medical condition called atrial fibrillation (a type of irregular heartbeat) OR to reduce the risk of a blood clots forming after orthopedic surgery.  What do You need to know about Eliquis ? Take your Eliquis TWICE DAILY - one tablet in the morning and one tablet in the evening with or without food.  It would be best to take the doses about the same time each day.  If you have difficulty swallowing the tablet whole please discuss with your pharmacist how to take the medication safely.  Take Eliquis exactly as prescribed by your doctor and DO NOT stop taking Eliquis without talking to the doctor who prescribed the medication.  Stopping may increase your risk of developing a new clot or stroke.  Refill your prescription before you run out.  After discharge, you should have regular check-up appointments with your healthcare provider that is prescribing your Eliquis.  In the future your dose may need to be changed if your kidney function or weight changes by a significant amount or as you get older.  What do you do if you miss a dose? If you miss a dose, take it as soon as you remember on the same day and resume taking twice daily.  Do not take more than one dose of ELIQUIS at the same time.  Important Safety Information A possible side effect of Eliquis is bleeding. You should call your healthcare provider right away if you experience any of the following: ? Bleeding from an injury or your nose that does not stop. ? Unusual colored urine (red or dark brown) or unusual colored stools (red or  black). ? Unusual bruising for unknown reasons. ? A serious fall or if you hit your head (even if there is no bleeding).  Some medicines may interact with Eliquis and might increase your risk of bleeding or clotting while on Eliquis. To help avoid this, consult your healthcare provider or pharmacist prior to using any new prescription or non-prescription medications, including herbals, vitamins, non-steroidal anti-inflammatory drugs (NSAIDs) and supplements.  This website has more information on Eliquis (apixaban): www.Eliquis.com.    

## 2019-03-21 NOTE — TOC Initial Note (Signed)
Transition of Care [facility]) - Initial/Assessment Note    Patient Details  Name: [name] [name] MRN: [id number] Date of Birth: 1926/12/22  Transition of Care [facility]) CM/SW Contact:    Gelene Mink, [place] Phone Number: 03/21/2019, 1:30 PM  Clinical Narrative:          CSW met with the patient and his daughter at bedside. CSW introduced herself and explained her role. CSW shared therapy recommendation.  CSW provided [place] List. Patient daughter stated that she has all durable medical equipment at home. She stated that she can afford all of her father's medications. She stated that they utilize [facility] for his transportation needs. She stated she did not have a preference of home health agency.   CSW called and spoke with Georgina Snell with [facility]. He is able to provide PT, OT, RN, and nurse aid.   CSW called and spoke with Dr. Ronnald Ramp, resident assigned to the patient's case. She is going to put in the patient's home health orders.   If the patient discharges by Wednesday, we will need to call [facility] no later than 5:00pm on Tuesday and inform them.   CSW will continue to follow and assist with disposition planning.    Expected Discharge Plan: [place] Barriers to Discharge: Continued Medical Work up   Patient Goals and CMS Choice Patient states their goals for this hospitalization and ongoing recovery are:: Pt wants to return home CMS Medicare.gov Compare Post Acute Care list provided to:: Patient Represenative (must comment) Choice offered to / list presented to : Adult Children  Expected Discharge Plan and Services Expected Discharge Plan: [place] In-house Referral: Clinical Social Work Discharge Planning Services: NA Post Acute Care Choice: [place] arrangements for the past 2 months: Single Family Home                 DME Arranged: N/A DME Agency: NA       HH Arranged: PT, OT, RN, Nurse's Aide [place] Agency: [place] Date [facility] Agency Contacted: 03/21/19 Time HH Agency Contacted: [id number] Representative spoke with at [place]: Tommi Rumps  Prior Living Arrangements/Services Living arrangements for the past 2 months: [place] Lives with:: Adult Children Patient language and need for interpreter reviewed:: No Do you feel safe going back to the place where you live?: Yes      Need for Family Participation in Patient Care: Yes (Comment) Care giver support system in place?: Yes (comment) Current home services: Home OT, Home PT, Homehealth aide, Home RN Criminal Activity/Legal Involvement Pertinent to Current Situation/Hospitalization: No - Comment as needed  Activities of Daily Living      Permission Sought/Granted Permission sought to share information with : Case Manager Permission granted to share information with : Yes, Verbal Permission Granted  Share Information with NAME: Leoncio Hansen  Permission granted to share info w AGENCY: [facility]  Permission granted to share info w Relationship: Daughter     Emotional Assessment Appearance:: Appears stated age   Affect (typically observed): Accepting Orientation: : Oriented to Self Alcohol / Substance Use: Not Applicable Psych Involvement: No (comment)  Admission diagnosis:  Acute on chronic congestive heart failure, unspecified heart failure type [facility]) [I50.9] Patient Active Problem List   Diagnosis Date Noted  . CHF exacerbation ([place]) 03/18/2019  . Acute on chronic systolic CHF (congestive heart failure) ([place]) 11/23/2018  . Troponin level elevated 11/23/2018  . Acute on chronic heart failure ([place])   .  Atrial fibrillation and flutter ([place])   . Atrial fibrillation with RVR ([place]) 07/15/2018  . S/P AKA (above knee amputation) bilateral ([place])   . Pressure injury of skin 10/04/2017  . Hyperkalemia 11/17/2016  . Venous stasis ulcer ([place]) 11/17/2016  . Venous stasis ulcer of lower extremity ([place]) 05/23/2015  . Cellulitis of both lower extremities  10/18/2014  . Chronic renal insufficiency, stage 3 (moderate) ([place]) 10/18/2014  . HTN (hypertension), benign 10/18/2014  . Chronic diastolic CHF (congestive heart failure) ([place]) 10/18/2014  . Lower extremity cellulitis 10/18/2014  . PAD (peripheral artery disease) ([place]) 02/02/2014  . Atherosclerosis of native arteries of the extremities with ulceration ([place]) 07/22/2013  . Acute renal failure ([place]) 11/15/2012  . Fall 11/15/2012  . Diabetic ulcer of left foot ([place]) 10/15/2012  . Diabetes mellitus ([place]) 10/15/2012  . Lymphadenitis 08/07/2012  . Cellulitis 08/07/2012  . HTN (hypertension) 08/05/2012  . Renal insufficiency 08/05/2012  . Tobacco abuse 08/05/2012  . Gout 08/05/2012  . Hyperglycemia 08/05/2012   PCP:  [name], MD Pharmacy:   [place], [address] [place] [address] Phone: [phone] Fax: [phone]  [id number] - [place], [place] - [address] [place] [address] Phone: [phone] Fax: [phone]  [place], [place], [place] [address], [place] [address] Phone: [phone] Fax: [place], [place] [address] [place] [place] [address] Phone: [phone] Fax: [phone]     Social Determinants of Health (SDOH) Interventions    Readmission Risk Interventions No flowsheet data found.

## 2019-03-21 NOTE — Care Management Important Message (Signed)
Important Message  Patient Details  Name: Saiyan Roccaforte MRN: [id number] Date of Birth: 1927/02/20   Medicare Important Message Given:  Yes     Shelda Altes 03/21/2019, 12:15 PM

## 2019-03-21 NOTE — Progress Notes (Addendum)
   Subjective: Pt seen at the bedside this morning. Up and eating breakfast in the chair. States he feels well and endorses good urine output. No acute concerns at this time.  Objective:  Vital signs in last 24 hours: Vitals:   03/20/19 0610 03/20/19 1950 03/20/19 2023 03/21/19 0430  BP: 105/62  115/77 100/63  Pulse: (!) 56 (!) 57 (!) 44 (!) 55  Resp: 18  18 18   Temp: 97.8 F (36.6 C)  98.2 F (36.8 C) 98.4 F (36.9 C)  TempSrc: Oral  Oral Oral  SpO2: 99% 99% 100% 96%  Weight: 85.3 kg      Physical Exam Vitals signs and nursing note reviewed.  Constitutional:      General: He is not in acute distress.    Appearance: He is not ill-appearing.  Pulmonary:     Effort: Pulmonary effort is normal.     Comments: On RA Musculoskeletal:     Comments: Bilateral AKAs. Pitting edema in thighs unchanged. Pt has scrotal sling in place. Scrotal and penile swelling unchanged from yesterday.   Skin:    General: Skin is warm and dry.  Neurological:     Mental Status: He is alert.    Assessment/Plan:  Active Problems:   CHF exacerbation [facility])   [name] [name] is a 85 year old male with a significant PMH of DVT, psoriasis, type 2 diabetes, hypertension, PAD status post bilateral AKA's, chronic systolic heart failure with an EF of 40 to 45% who presented with 1-2 week history of testicular and penile swelling, BNP >2000, moderate pulmonary vascular congestion on CXR, and bibasilar crackles on exam consistent with an acute on chronic heart failure exacerbation.   Acute on chronic systolic heart failure exacerbation Echo on 11/2018 with LVEF 40-45%, severe asymmetric LVH, moderately reduced RV systolic function, and moderate pulmonary HTN. Weight of 77kg documented in the chart on 10/2018, 85kg on admission. Net negative 5.5L this hospitalization. -continue IV furosemide 40mg  BID - net negative 1.1L yesterday, urine output 2.3L - Cr bumped from 1.3 to 1.6 today, will continue to monitor -  continue strict I&Os with daily weights and 1831mL fluid restriction - monitor daily electrolytes - continue homelosartan 50mg  daily  Pt seen by PT/OT - continue scrotal sling - home health PT/OT at discharge   Hx of A.fib w/ RVR Currently in sinus. Rate control with metoprolol at home. CHADSVASC of 5. - continue home apixaban 5mg  BID and metoprolol 100mg  daily  HTN - continue homelosartan 50mg  daily and metoprolol 100mg  daily  Psoriasis - triamcinolone 0.025% cream BID   Diet: heart healthy Fluids:none DVT [id number] apixaban 5mg  BID CODE STATUS:PARTIAL  Dispo: Anticipated dischargein 1-2 days   Ladona Horns, MD 03/21/2019, 6:31 AM Pager: [phone]

## 2019-03-21 NOTE — Progress Notes (Signed)
  Date: 03/21/2019  Patient name: [name] [name]  Medical record number: [id number]  Date of birth: October 11, 1926        I have seen and evaluated this patient and I have discussed the plan of care with the house staff. Please see their note for complete details. I concur with their findings.  Patient here with HFrEF exacerbation, good response to IV lasix but remains volume up. Will likely need a few more days of IV diuresis. Mild bump in creatinine today, will continue to monitor. Strict I&Os, daily weights.   Velna Ochs, MD 03/21/2019, 9:42 AM

## 2019-03-21 NOTE — Progress Notes (Signed)
Occupational Therapy Treatment Patient Details Name: [name] [name] MRN: [id number] DOB: 07/10/1926 Today's Date: 03/21/2019    History of present illness Patient is a 85 y/o male who presents with testicular and penile swelling, BNP >2000. CXR-moderate pulmonary vascular congestion. Admitted with acute on chronic heart failure exacerbation.  PMH includes PVD s/p bil AKAs, HTN, DM, gout, CKD, DVT, heart failure.   OT comments  OT to check scrotal sling. No redness or skin breakdown noted.  Reviewed technique to don/doff with patient, but poor carryover.  Patient able to verbalize sling is for swelling, but further education provided on benefits.  No pain.  Will follow acutely.    Follow Up Recommendations  Home health OT;Supervision - Intermittent    Equipment Recommendations  None recommended by OT    Recommendations for Other Services      Precautions / Restrictions Precautions Precautions: Fall;Other (comment) Precaution Comments: BiL AKAs Restrictions Weight Bearing Restrictions: No       Mobility Bed Mobility               General bed mobility comments: up in chair upon entry   Transfers                      Balance                                           ADL either performed or assessed with clinical judgement   ADL Overall ADL's : Needs assistance/impaired                                       General ADL Comments: session focused on scrotal sling check- no redness or irritation noted after wearing since OT donned yesterday.      Vision       Perception     Praxis      Cognition Arousal/Alertness: Awake/alert Behavior During Therapy: WFL for tasks assessed/performed Overall Cognitive Status: No family/caregiver present to determine baseline cognitive functioning                                 General Comments: likely baseline cognition, poor recall of scrotal sling         Exercises      Shoulder Instructions       General Comments provided further education on scrotal sling mgmt, pt reports no pain, therapist noted no redness or skin irritation; educated on benefits of compression, elevation for edema reduction     Pertinent Vitals/ Pain       Pain Assessment: No/denies pain  Home Living                                          Prior Functioning/Environment              Frequency  Min 2X/week        Progress Toward Goals  OT Goals(current goals can now be found in the care plan section)  Progress towards OT goals: Progressing toward goals  Acute Rehab OT Goals Patient Stated Goal: to get some legs OT Goal Formulation: With patient ADL  Goals Pt Will Perform Lower Body Dressing: with mod assist;sitting/lateral leans Pt Will Transfer to Toilet: with +2 assist;with min assist;anterior/posterior transfer Pt/caregiver will Perform Home Exercise Program: Increased strength;With written HEP provided;Both right and left upper extremity;With theraband Additional ADL Goal #1: Pt will verbalize benefits of scrotal sling, demonstrates ability to don (or verbally teach caregiver to don) with min assist.  Plan Discharge plan remains appropriate;Frequency remains appropriate    Co-evaluation                 AM-PAC OT "6 Clicks" Daily Activity     Outcome Measure   Help from another person eating meals?: None Help from another person taking care of personal grooming?: None Help from another person toileting, which includes using toliet, bedpan, or urinal?: A Little Help from another person bathing (including washing, rinsing, drying)?: A Little Help from another person to put on and taking off regular upper body clothing?: A Little Help from another person to put on and taking off regular lower body clothing?: A Little 6 Click Score: 20    End of Session    OT Visit Diagnosis: Muscle weakness (generalized) (M62.81)   Activity  Tolerance Patient tolerated treatment well   Patient Left in chair;with call bell/phone within reach;with chair alarm set   Nurse Communication Mobility status;Other (comment)(scrotal sling)        Time: [id number] OT Time Calculation (min): 14 min  Charges: OT General Charges $OT Visit: 1 Visit OT Treatments $Self Care/Home Management : 8-22 mins  Delight Stare, [place] Pager [phone] Office [phone]    Delight Stare 03/21/2019, 12:50 PM

## 2019-03-22 ENCOUNTER — Inpatient Hospital Stay (HOSPITAL_COMMUNITY): Payer: Medicare Other

## 2019-03-22 DIAGNOSIS — I10 Essential (primary) hypertension: Secondary | ICD-10-CM

## 2019-03-22 DIAGNOSIS — N5089 Other specified disorders of the male genital organs: Secondary | ICD-10-CM

## 2019-03-22 DIAGNOSIS — I480 Paroxysmal atrial fibrillation: Secondary | ICD-10-CM

## 2019-03-22 DIAGNOSIS — I959 Hypotension, unspecified: Secondary | ICD-10-CM

## 2019-03-22 DIAGNOSIS — I509 Heart failure, unspecified: Secondary | ICD-10-CM

## 2019-03-22 LAB — BASIC METABOLIC PANEL
Anion gap: 10 (ref 5–15)
BUN: 23 mg/dL (ref 8–23)
CO2: 32 mmol/L (ref 22–32)
Calcium: 8.7 mg/dL — ABNORMAL LOW (ref 8.9–10.3)
Chloride: 96 mmol/L — ABNORMAL LOW (ref 98–111)
Creatinine, Ser: 1.45 mg/dL — ABNORMAL HIGH (ref 0.61–1.24)
GFR calc Af Amer: 49 mL/min — ABNORMAL LOW (ref >60)
GFR calc non Af Amer: 42 mL/min — ABNORMAL LOW (ref >60)
Glucose, Bld: 94 mg/dL (ref 70–99)
Potassium: 4 mmol/L (ref 3.5–5.1)
Sodium: 138 mmol/L (ref 135–145)

## 2019-03-22 LAB — BRAIN NATRIURETIC PEPTIDE: B Natriuretic Peptide: 1887.8 pg/mL — ABNORMAL HIGH (ref 0.0–100.0)

## 2019-03-22 MED ORDER — METOPROLOL SUCCINATE ER 50 MG PO TB24
50.0000 mg | ORAL_TABLET | Freq: Every day | ORAL | Status: DC
  Filled 2019-03-22: qty 1

## 2019-03-22 NOTE — Progress Notes (Signed)
Internal Medicine Attending Note:  I have seen and evaluated this patient and I have discussed the plan of care with the house staff. Please see their note for complete details. I concur with their findings.  Patient has a history of biventricular heart failure here with volume overload and acute exacerbation in the setting of decreasing his home lasix. Last Echo on 12/16/18 with EF 40-45%, moderately reduced right ventricular systolic function, moderately elevated RV pressures, and normal right atrial size.   Initially had good response to IV lasix, but net negative only 695 cc yesterday now with hypotension. We are holding lasix and antihypertensives.   Appreciate cardiology recommendations.   Velna Ochs, MD 03/22/2019, 2:14 PM

## 2019-03-22 NOTE — Progress Notes (Signed)
Physical Therapy Treatment Patient Details Name: [name] [name] MRN: [id number] DOB: 15-May-1926 Today's Date: 03/22/2019    History of Present Illness Patient is a 85 y/o male who presents with testicular and penile swelling, BNP >2000. CXR-moderate pulmonary vascular congestion. Admitted with acute on chronic heart failure exacerbation.  PMH includes PVD s/p bil AKAs, HTN, DM, gout, CKD, DVT, heart failure.    PT Comments    Pt in bed sleeping upon arrival of PT, agreeable to limited session due to RN report that ultrasound is coming soon and does not want the pt in the recliner. Pt was able to transfer to sitting EOB, but needed modA of 2 to complete transfer to sitting EOB. Pt was then able to sit with minA/min guard for ~13 min while performing reaching tasks and attempting to A-P scoot in the bed. The Pt will continue to benefit from skilled PT to progress independence with transfers and bed mobility to facilitate return home.     Follow Up Recommendations  Home health PT;Supervision for mobility/OOB     Equipment Recommendations  None recommended by PT    Recommendations for Other Services       Precautions / Restrictions Precautions Precautions: Fall Precaution Comments: BiL AKAs Restrictions Weight Bearing Restrictions: No    Mobility  Bed Mobility Overal bed mobility: Needs Assistance Bed Mobility: Supine to Sit     Supine to sit: Min assist;+2 for physical assistance     General bed mobility comments: pt with good initiation of movement, needed physical assist and use of bed rails to scoot hips towards EOB  Transfers                 General transfer comment: not attempted today due to RN report that [place] was coming soon, wanted PT to focus on bed mobility. Pt was able to demo repositioning in bed with VCs and demonstration.  Ambulation/Gait             General Gait Details: Unable   Stairs             Wheelchair Mobility    Modified Rankin  (Stroke Patients Only)       Balance Overall balance assessment: Needs assistance Sitting-balance support: Bilateral upper extremity supported Sitting balance-Leahy Scale: Fair Sitting balance - Comments: able to tolerate for ~10 min, single UE support during bilat reaching tasks, and practice propping to B elbows with return to midline. Postural control: Posterior lean                                  Cognition Arousal/Alertness: Awake/alert Behavior During Therapy: WFL for tasks assessed/performed Overall Cognitive Status: No family/caregiver present to determine baseline cognitive functioning                                 General Comments: likely baseline cognition, pt having difficult time understanding questions from PT, improved correspondance with PT tech. improved understanding with demonstration      Exercises      General Comments        Pertinent Vitals/Pain Pain Assessment: Faces Faces Pain Scale: Hurts little more Pain Location: with mobility, not localized Pain Descriptors / Indicators: Grimacing Pain Intervention(s): Monitored during session;Repositioned    Home Living  Prior Function            PT Goals (current goals can now be found in the care plan section) Acute Rehab PT Goals Patient Stated Goal: to get some legs PT Goal Formulation: With patient Time For Goal Achievement: 04/02/19 Potential to Achieve Goals: Fair Progress towards PT goals: Progressing toward goals    Frequency    Min 3X/week      PT Plan Current plan remains appropriate    Co-evaluation              AM-PAC PT "6 Clicks" Mobility   Outcome Measure  Help needed turning from your back to your side while in a flat bed without using bedrails?: A Little Help needed moving from lying on your back to sitting on the side of a flat bed without using bedrails?: A Little Help needed moving to and from a bed to  a chair (including a wheelchair)?: A Lot Help needed standing up from a chair using your arms (e.g., wheelchair or bedside chair)?: Total Help needed to walk in hospital room?: Total Help needed climbing 3-5 steps with a railing? : Total 6 Click Score: 11    End of Session   Activity Tolerance: Patient tolerated treatment well Patient left: in bed;with call bell/phone within reach;with bed alarm set Nurse Communication: Mobility status PT Visit Diagnosis: Muscle weakness (generalized) (M62.81)     Time: 1401-1420 PT Time Calculation (min) (ACUTE ONLY): 19 min  Charges:  $Therapeutic Activity: 8-22 mins                     Mickey Farber, PT, DPT   Acute Rehabilitation Department [phone]   Otho Bellows 03/22/2019, 2:56 PM

## 2019-03-22 NOTE — Plan of Care (Signed)
  Problem: Clinical Measurements: Goal: Respiratory complications will improve Outcome: Progressing Note: No s/s of respiratory complications noted.  Stable on room air. Goal: Cardiovascular complication will be avoided Outcome: Progressing Note: No s/s of cardiovascular complication.  NSR/SB on telemetry.  VSS.

## 2019-03-22 NOTE — Progress Notes (Signed)
   Subjective: Pt seen at the bedside this morning, sleeping. Able to arouse upon walking into the room. Pt states he has had good urine output and denies dysuria or urgency. He does not recall having an outpatient cardiologist. No acute concerns at this time.   Objective:  Vital signs in last 24 hours: Vitals:   03/21/19 0931 03/21/19 1412 03/21/19 2040 03/22/19 0402  BP: 110/68 (!) 84/56 101/60 (!) 93/49  Pulse: 65 (!) 53 (!) 56 (!) 57  Resp:  15    Temp:  98.3 F (36.8 C) 97.9 F (36.6 C) 99 F (37.2 C)  TempSrc:  Oral Oral Oral  SpO2:  95% 99% 98%  Weight:    84 kg   Physical Exam Constitutional:      General: He is not in acute distress.    Appearance: He is not ill-appearing.     Comments: Pt is alert though sleepy.  Neck:     Comments: Unable to assess JVD secondary to pt's inability to be positioned appropriately while drowsy Cardiovascular:     Rate and Rhythm: Bradycardia present.  Pulmonary:     Effort: Pulmonary effort is normal.     Comments: On RA. Genitourinary:    Comments: Scrotal swelling unchanged to mildly improved from yesterday. Musculoskeletal:     Comments: Peripheral stump edema unchanged from previous.  Skin:    General: Skin is warm and dry.    Assessment/Plan:  Active Problems:   CHF exacerbation [facility])   [name] [name] a 85 year old male with asignificant PMHof DVT, psoriasis, type 2 diabetes, hypertension, PAD status post bilateral AKA's, chronic systolic heart failure with an EF of 40 to 45% who presented with 1-2 week history oftesticular and penile swelling,BNP >2000,moderate pulmonary vascular congestion on CXR, and bibasilar crackles on exam consistent with an acute on chronic heart failure exacerbation.   Acute on chronic systolic heart failure exacerbation Echo on8/2020with LVEF 40-45%, severe asymmetric LVH, moderately reduced RV systolic function, and moderate pulmonary HTN. Weight of77kgdocumented in the chart on7/2020,  85kg on admission.  Pt with likely right sided HF exacerbation with peripheral edema and no pulmonary symptoms. Pt has been bradycardic and is now hypotensive with continued diuresis. He appears to be volume up with scrotal and stump swelling. Will consult cardiology today for aid in diuresis as pt may need midodrine/pressure support for additional diuresis.  - holdIV furosemide for now - half home metoprolol to 50mg  daily for now -holdhomelosartan 50mg  daily  - net negative 700cc yesterday, with -6L out since admission - Cr stable at 1.4 will continue to monitor - continue strict I&Oswith dailyweights and 1880mL fluid restriction -monitordaily electrolytes  Pt seen by PT/OT - continue scrotal sling - home health PT/OT/RN/aide at discharge - will make social work aware of anticipated discharge when ready due to transportation needs, do not expect discharge today or tomorrow   Hx of A.fib w/ RVR Currently bradycardic in sinus. Rate control withmetoprolol at home. CHADSVASC of 5. -continue home apixaban 5mg  BID and metoprolol 100mg  daily  HTN -holdinghomelosartan 50mg  daily  - halfing homemetoprolol to 50mg  daily  Psoriasis -triamcinolone 0.025% creamBID   Diet:heart healthy Fluids:none DVTppx:home apixaban 5mg  BID CODE STATUS:PARTIAL  Dispo: Anticipated dischargepending additional diuresis   Ladona Horns, MD 03/22/2019, 6:30 AM Pager: [phone]

## 2019-03-22 NOTE — Consult Note (Signed)
Cardiology Consultation:   Patient ID: [name] [name] MRN: [id number]; DOB: 23-Sep-1926  Admit date: 03/18/2019 Date of Consult: 03/22/2019  Primary Care Provider: [name], MD Primary Cardiologist: Skeet Latch, MD  Primary Electrophysiologist:  None    Patient Profile:   [name] [name] is a 85 y.o. male with a hx of Dementia, DVT, CKD stage III, DM, HTN, HLD, bilateral AKAs, PAD, Chronic combined Heart Failure, Paroxysmal afib on eliquis  who is being seen today for the evaluation of acute heart failure at the request of Dr. Philipp Ovens.  History of Present Illness:   History was obtained from the chart.  [name] [name] follows with Dr. Oval Linsey for the above cardiac problems. He was first diagnosed with Afib RVR in March 2020 also found to be in acute heart failure. Patient converted to NSR with PO dilt. Toprol 100 mg daily was started. Eliquis was started for stroke prophylaxis. Echo showed EF 15-20%, severely reduced systolic function, severely dilated LA, trivial pericardial effusion, mild calcification of aortic valve, mild thickening of mitral valve. 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 Losartan 50 mg was started. In May 2020 he was seen in the afib clinic as a virtual visit. He was doing well from a cardiac standpoint  The patient was admitted 7/28 - 7/30 for acute CHF. He was diuresed and sent home. Follow up echo in August showed improved EF 40-45%.  The patient presented to the ED 03/18/19 with penile and testicular swelling that had been worsening since slowly cutting back on his lasix (40 mg daily to 40 every other day). He was having trouble urinating as well. Denied chest pain or sob. BNP 2000. Cr was at baseline, 1.5. EKG showed NSR. Weight on admission was 187 lbs. Patient was admitted with for IV diuresis.  Scrotal sling was placed for support. Patient had been diuresing well. However, today pressures were noted to be low, systolics [id number] and IV lasix was  held. Cardiology was consulted.   Heart Pathway Score:     Past Medical History:  Diagnosis Date   Chronic kidney disease    RENAL INSUFICCIENCY   Diabetes mellitus without complication ([place])    DVT (deep venous thrombosis) ([place])    Gout    Hypertension    Lymphadenitis 08/10/2012   Psoriasis     Past Surgical History:  Procedure Laterality Date   AMPUTATION Bilateral 10/08/2017   Procedure: Bilateral  ABOVE KNEE amputations;  Surgeon: Conrad Copperas Cove, MD;  Location: [facility] OR;  Service: Vascular;  Laterality: Bilateral;     Home Medications:  Prior to Admission medications   Medication Sig Start Date End Date Taking? Authorizing Provider  apixaban (ELIQUIS) 5 MG TABS tablet Take 1 tablet (5 mg total) by mouth 2 (two) times daily. 10/08/18  Yes Fulp, Cammie, MD  fluocinolone (SYNALAR) 0.025 % cream Apply topically 2 (two) times daily. To the scalp and sparingly to the face 01/12/19  Yes Fulp, Cammie, MD  furosemide (LASIX) 40 MG tablet Take 1 tablet (40 mg total) by mouth daily. 10/08/18  Yes Fulp, Cammie, MD  gabapentin (NEURONTIN) 100 MG capsule PATIENT TAKES 2 CAPSULES IN THE MORNING AND 2 CAPSULES EVERY EVENING AND 3 CAPSULES AT BEDTIME TIME Patient taking differently: Take 100 mg by mouth See admin instructions. PATIENT TAKES 2 CAPSULES IN THE MORNING AND 2 CAPSULES EVERY EVENING AND 3 CAPSULES AT BEDTIME TIME 02/11/19  Yes Fulp, Cammie, MD  hydrocortisone cream 1 % Apply topically 2 (two) times daily. 11/25/18  Yes Shelly Coss, MD  losartan (COZAAR) 50 MG tablet Take 1 tablet (50 mg total) by mouth daily. 10/08/18  Yes Fulp, Cammie, MD  metoprolol succinate (TOPROL-XL) 100 MG 24 hr tablet TAKE 1 TABLET BY MOUTH ONCE DAILY WITH OR IMMEDIATELY FOLLOWING A MEAL Patient taking differently: Take 100 mg by mouth daily.  03/14/19  Yes Fulp, Cammie, MD  potassium chloride 20 MEQ TBCR Take 20 mEq by mouth daily. 11/25/18 03/18/19 Yes Shelly Coss, MD    Inpatient  Medications: Scheduled Meds:  apixaban  5 mg Oral BID   gabapentin  200 mg Oral BID   gabapentin  300 mg Oral QHS   metoprolol succinate  50 mg Oral Daily   sodium chloride flush  3 mL Intravenous Q12H   triamcinolone   Topical BID   Continuous Infusions:  PRN Meds: acetaminophen **OR** acetaminophen, ondansetron **OR** ondansetron (ZOFRAN) IV, senna-docusate  Allergies:   No Known Allergies  Social History:   Social History   Socioeconomic History   Marital status: Widowed    Spouse name: Not on file   Number of children: Not on file   Years of education: Not on file   Highest education level: Not on file  Occupational History   Occupation: Retired  Scientist, product/process development strain: Not on file   Food insecurity    Worry: Not on file    Inability: Not on Lexicographer needs    Medical: Not on file    Non-medical: Not on file  Tobacco Use   Smoking status: Former Smoker    Years: 40.00    Types: Pipe   Smokeless tobacco: Never Used  Substance and Sexual Activity   Alcohol use: No   Drug use: No   Sexual activity: Not on file  Lifestyle   Physical activity    Days per week: Not on file    Minutes per session: Not on file   Stress: Not on file  Relationships   Social connections    Talks on phone: Not on file    Gets together: Not on file    Attends religious service: Not on file    Active member of club or organization: Not on file    Attends meetings of clubs or organizations: Not on file    Relationship status: Not on file   Intimate partner violence    Fear of current or ex partner: Not on file    Emotionally abused: Not on file    Physically abused: Not on file    Forced sexual activity: Not on file  Other Topics Concern   Not on file  Social History Narrative   He has a room in his daughter's house.    Family History:   Family History  Problem Relation Age of Onset   Hypertension Mother    Stroke  Mother    Hypertension Father      ROS:  Please see the history of present illness.  All other ROS reviewed and negative.     Physical Exam/Data:   Vitals:   03/21/19 1412 03/21/19 2040 03/22/19 0402 03/22/19 0913  BP: (!) 84/56 101/60 (!) 93/49 97/63  Pulse: (!) 53 (!) 56 (!) 57 70  Resp: 15     Temp: 98.3 F (36.8 C) 97.9 F (36.6 C) 99 F (37.2 C)   TempSrc: Oral Oral Oral   SpO2: 95% 99% 98%   Weight:   84 kg     Intake/Output  Summary (Last 24 hours) at 03/22/2019 1035 Last data filed at 03/22/2019 1030 Gross per 24 hour  Intake 1320 ml  Output 2175 ml  Net -855 ml   Last 3 Weights 03/22/2019 03/20/2019 11/25/2018  Weight (lbs) 185 lb 3 oz 188 lb 0.8 oz 169 lb 5 oz  Weight (kg) 84 kg 85.3 kg 76.8 kg     Body mass index is 56.51 kg/m.  General:  Well nourished, well developed, in no acute distress HEENT: normal Lymph: no adenopathy Neck: no JVD Endocrine:  No thryomegaly Vascular: No carotid bruits; FA pulses 2+ bilaterally without bruits  Cardiac:  normal S1, S2; RRR; no murmur  Lungs:  CTA, no wheezing, rhonchi or rales  Abd: soft, nontender, no hepatomegaly  Ext: Trace edema, scrotal is large, but soft  Musculoskeletal:  B/L AKAs; BUE and BLE strength normal and equal Skin: warm and dry  Neuro:  CNs 2-12 intact, no focal abnormalities noted Psych:  Normal affect   EKG:  The EKG was personally reviewed and demonstrates:  NSR, 69 bpm, nonspecific T wave changes Telemetry:  Telemetry was personally reviewed and demonstrates:  NSR with intermitted bradycardia, HR 50-60s; 5 beats NSVT; PVCs. First degree AV block PRI 0.26s  Relevant CV Studies:  Echo 12/16/18  1. The left ventricle has mild-moderately reduced systolic function, with an ejection fraction of 40-45%. The cavity size was normal. There is severe asymmetric left ventricular hypertrophy. Left ventricular diastolic Doppler parameters are consistent  with pseudonormalization.  2. The right ventricle  has moderately reduced systolic function. The cavity was normal. There is no increase in right ventricular wall thickness. Right ventricular systolic pressure is moderately elevated.  3. Left atrial size was moderately dilated.  4. Mitral valve regurgitation is mild to moderate by color flow Doppler. No evidence of mitral valve stenosis.  5. The aortic valve is abnormal. Mild thickening of the aortic valve. Mild calcification of the aortic valve. No stenosis of the aortic valve.  6. The aorta is normal unless otherwise noted.  7. The aortic root and ascending aorta are normal in size and structure.  8. There is mild dilatation of the ascending aorta.  9. Pulmonary hypertension is moderate. 10. The atrial septum is grossly normal. 11. The average left ventricular global longitudinal strain is -7.8 %.  Laboratory Data:  High Sensitivity Troponin:  No results for input(s): TROPONINIHS in the last 720 hours.   Chemistry Recent Labs  Lab 03/20/19 0323 03/21/19 0412 03/22/19 0438  NA 140 140 138  K 3.7 3.8 4.0  CL 100 99 96*  CO2 30 32 32  GLUCOSE 88 96 94  BUN 18 21 23   CREATININE 1.34* 1.60* 1.45*  CALCIUM 8.5* 8.8* 8.7*  GFRNONAA 46* 37* 42*  GFRAA 54* 43* 49*  ANIONGAP 10 9 10     No results for input(s): PROT, ALBUMIN, AST, ALT, ALKPHOS, BILITOT in the last 168 hours. Hematology Recent Labs  Lab 03/18/19 1537  WBC 3.4*  RBC 4.18*  HGB 13.8  HCT 42.7  MCV 102.2*  MCH 33.0  MCHC 32.3  RDW 16.1*  PLT 162   BNP Recent Labs  Lab 03/18/19 1537  BNP 2,136.8*    DDimer No results for input(s): DDIMER in the last 168 hours.   Radiology/Studies:  Dg Pelvis Portable  Result Date: 03/18/2019 CLINICAL DATA:  Groin pain, penile swelling. EXAM: PORTABLE PELVIS 1-2 VIEWS COMPARISON:  11/15/2012 FINDINGS: Negative for fracture or focal bone lesion. Degenerative change in the lumbar spine.  Hip joints appear normal for age. IMPRESSION: Negative. Electronically Signed   By:  Franchot Gallo M.D.   On: 03/18/2019 16:32   Dg Chest Portable 1 View  Result Date: 03/18/2019 CLINICAL DATA:  Fluid overload EXAM: PORTABLE CHEST 1 VIEW COMPARISON:  11/23/2018 FINDINGS: Cardiac enlargement with mild vascular congestion. Moderate left effusion and left lower lobe atelectasis with mild progression. No effusion on the right. IMPRESSION: Vascular congestion with moderate left effusion and left lower lobe atelectasis. Electronically Signed   By: Franchot Gallo M.D.   On: 03/18/2019 16:19    Assessment and Plan:   Acute on Chronic Heart Failure Patient presented with scrotal swelling, lower leg edema. BNP >2000. CXR with vascular congestion with moderate left effusion. - Last EF in August was 40-45%, improved from in March 2020 15-20%.  - Patient had been diuresing well with IV lasix 40 mg BID. Today pressures dropped systolic XX123456. Iv lasix was held. Patient is asymptomatic - Hold antihypertensives - creatinine has been stable, 1.60 > 1.45 - Total output since admission -6L - Weight down 188 > 185lbs.  - Patient has minimal edema lower extremities>>Will check a BNP to monitor response - Scrotum is still large>>Will check a US scrotum.  - Hold Lasix and antihypertensives for now. Reevaluate pressures and fluid status in the AM. MD to see  Paroxysmal Afib - maintaining NSR/sinus brady - rate controlled with metoprolol>> will hold - CHADSVASC 5 (HTN, Age, DM, Vascular, CHF) - continue Eliquis  HTN - Pressures noted to drop today. Most recent 97/63. - Losartan held and Metoprolol decreased to 50 mg daily - Hr have been in the 50s. Will hold BB  Scrotal swelling - ?reportedly unchanged - scrotal sling - US scrotum to evaluate   For questions or updates, please contact [place] Please consult [email or web address] for contact info under     Signed, Nalin Mazzocco Ninfa Meeker, PA-C  03/22/2019 10:35 AM

## 2019-03-23 ENCOUNTER — Inpatient Hospital Stay (HOSPITAL_COMMUNITY): Payer: Medicare Other

## 2019-03-23 DIAGNOSIS — I480 Paroxysmal atrial fibrillation: Secondary | ICD-10-CM

## 2019-03-23 DIAGNOSIS — I5021 Acute systolic (congestive) heart failure: Secondary | ICD-10-CM

## 2019-03-23 DIAGNOSIS — I5023 Acute on chronic systolic (congestive) heart failure: Secondary | ICD-10-CM

## 2019-03-23 LAB — BASIC METABOLIC PANEL
Anion gap: 11 (ref 5–15)
BUN: 25 mg/dL — ABNORMAL HIGH (ref 8–23)
CO2: 28 mmol/L (ref 22–32)
Calcium: 8.4 mg/dL — ABNORMAL LOW (ref 8.9–10.3)
Chloride: 99 mmol/L (ref 98–111)
Creatinine, Ser: 1.53 mg/dL — ABNORMAL HIGH (ref 0.61–1.24)
GFR calc Af Amer: 46 mL/min — ABNORMAL LOW (ref >60)
GFR calc non Af Amer: 39 mL/min — ABNORMAL LOW (ref >60)
Glucose, Bld: 93 mg/dL (ref 70–99)
Potassium: 4.5 mmol/L (ref 3.5–5.1)
Sodium: 138 mmol/L (ref 135–145)

## 2019-03-23 LAB — ECHOCARDIOGRAM COMPLETE: Weight: 2888.91 oz

## 2019-03-23 MED ORDER — FUROSEMIDE 10 MG/ML IJ SOLN
20.0000 mg | Freq: Two times a day (BID) | INTRAMUSCULAR | Status: DC
  Administered 2019-03-23 – 2019-03-24 (×4): 20 mg via INTRAVENOUS
  Filled 2019-03-23 (×4): qty 2

## 2019-03-23 MED ORDER — FUROSEMIDE 10 MG/ML IJ SOLN: 20.0000 mg | Freq: Two times a day (BID) | INTRAMUSCULAR | Status: DC

## 2019-03-23 NOTE — Progress Notes (Signed)
   Subjective: Pt seen at the bedside this morning, while eating breakfast. Pt feeling well, denies chest pain or shortness of breath. No acute concerns at this time.  Objective:  Vital signs in last 24 hours: Vitals:   03/22/19 0913 03/22/19 1437 03/22/19 2047 03/23/19 0501  BP: 97/63 (!) 112/54 91/68 103/65  Pulse: 70 (!) 56 (!) 53 (!) 57  Resp:  20    Temp:  98.2 F (36.8 C) 98.3 F (36.8 C) 98.6 F (37 C)  TempSrc:  Oral Oral Oral  SpO2: 99% 100% 95% 97%  Weight:    81.9 kg   Physical Exam Vitals signs and nursing note reviewed.  Constitutional:      General: He is not in acute distress.    Appearance: He is not ill-appearing.  Pulmonary:     Effort: Pulmonary effort is normal.  Genitourinary:    Comments: Scrotal sling in place. Swelling much improved from yesterday. Musculoskeletal:     Comments: Some dependent pitting edema around posterior thighs and hips.  Skin:    General: Skin is warm and dry.  Neurological:     Mental Status: He is alert.    Assessment/Plan:  Active Problems:   CHF exacerbation (HCC)   Scrotal swelling   Hypotension   PAF (paroxysmal atrial fibrillation) [facility])   Mr. Steinhaus a 85 year old male with asignificant PMHof DVT, psoriasis, type 2 diabetes, hypertension, PAD status post bilateral AKA's, chronic systolic heart failure with an EF of 40 to 45% who presented with 1-2 week history oftesticular and penile swelling,BNP >2000,moderate pulmonary vascular congestion on CXR, and bibasilar crackles on exam consistent with an acute on chronic heart failure exacerbation.   Acute on chronic systolic heart failure exacerbation Echo on8/2020with LVEF 40-45%, severe asymmetric LVH, moderately reduced RV systolic function, and moderate pulmonary HTN.Weightof77kgdocumented in the chart on7/2020, 85kgon admission.  Pt hypotensive yesterday with systolic BP in the XX123456 range. Diuresis and anti-hypertensive held yesterday. Scrotal  ultrasound significant for bilateral moderate hydroceles and small left varicocele. Repeat BNP remains elevated at 1888.   - pressures soft today 90-100s/60s - net +1L from yesterday with weight down 2.1kg - discrepancy noted - Cr stable at 1.5 today -continue strict I&Oswith dailyweightsand1842mL fluid restriction -monitordaily electrolytes  Cardiology consulted, appreciate recommendations  - begin general diuresis with 20mg IV furosemide BID today  - continue holding home metoprolol 50mg  dailyandlosartan 50mg  daily  - repeat echo  Pt seen by PT/OT - continue use of scrotal sling - home health PT/OT/RN/aide at discharge - will make social work aware of anticipated discharge when ready due to transportation needs, not expected today or tomorrow   Hx ofA.fib w/ RVR Currently bradycardic in sinus.Rate control withmetoprolol at home. CHADSVASC of 5. -continue home apixaban 5mg  BIDandmetoprolol100mg  daily  HTN -holdinghomelosartan 50mg  daily - holding homemetoprololto 50mg  daily  Psoriasis -triamcinolone 0.025% creamBID   Diet:heart healthy Fluids:none DVTppx:home apixaban 5mg  BID CODE STATUS:PARTIAL   Dispo: Anticipated dischargepending additional diuresis    Ladona Horns, MD 03/23/2019, 6:55 AM Pager: [phone]

## 2019-03-23 NOTE — Progress Notes (Addendum)
Progress Note  Patient Name: [name] [name] Date of Encounter: 03/23/2019  Primary Cardiologist: Skeet Latch, MD   Subjective   Denies any chest pain or SOB.  Still with some swelling of scrotum.  Scrotal US with moderate hydroceles.  Inpatient Medications    Scheduled Meds:  apixaban  5 mg Oral BID   gabapentin  200 mg Oral BID   gabapentin  300 mg Oral QHS   sodium chloride flush  3 mL Intravenous Q12H   triamcinolone   Topical BID   Continuous Infusions:  PRN Meds: acetaminophen **OR** acetaminophen, ondansetron **OR** ondansetron (ZOFRAN) IV, senna-docusate   Vital Signs    Vitals:   03/22/19 0913 03/22/19 1437 03/22/19 2047 03/23/19 0501  BP: 97/63 (!) 112/54 91/68 103/65  Pulse: 70 (!) 56 (!) 53 (!) 57  Resp:  20    Temp:  98.2 F (36.8 C) 98.3 F (36.8 C) 98.6 F (37 C)  TempSrc:  Oral Oral Oral  SpO2: 99% 100% 95% 97%  Weight:    81.9 kg    Intake/Output Summary (Last 24 hours) at 03/23/2019 0700 Last data filed at 03/22/2019 2000 Gross per 24 hour  Intake 1440 ml  Output 400 ml  Net 1040 ml   Filed Weights   03/20/19 0610 03/22/19 0402 03/23/19 0501  Weight: 85.3 kg 84 kg 81.9 kg    Telemetry    NSR - Personally Reviewed  ECG    No new EKG to revie - Personally Reviewed  Physical Exam   GEN: No acute distress.   Neck: No JVD Cardiac: RRR, no murmurs, rubs, or gallops.  Respiratory: Clear to auscultation bilaterally. GI: Soft, nontender, non-distended  MS: no edema of thighs; b/l AKA, scrotal edema Neuro:  Nonfocal  Psych: Normal affect   Labs    Chemistry Recent Labs  Lab 03/21/19 0412 03/22/19 0438 03/23/19 0329  NA 140 138 138  K 3.8 4.0 4.5  CL 99 96* 99  CO2 32 32 28  GLUCOSE 96 94 93  BUN 21 23 25*  CREATININE 1.60* 1.45* 1.53*  CALCIUM 8.8* 8.7* 8.4*  GFRNONAA 37* 42* 39*  GFRAA 43* 49* 46*  ANIONGAP 9 10 11      Hematology Recent Labs  Lab 03/18/19 1537  WBC 3.4*  RBC 4.18*  HGB 13.8  HCT  42.7  MCV 102.2*  MCH 33.0  MCHC 32.3  RDW 16.1*  PLT 162    Cardiac EnzymesNo results for input(s): TROPONINI in the last 168 hours. No results for input(s): TROPIPOC in the last 168 hours.   BNP Recent Labs  Lab 03/18/19 1537 03/22/19 1430  BNP 2,136.8* 1,887.8*     DDimer No results for input(s): DDIMER in the last 168 hours.   Radiology    US Scrotum W/doppler  Result Date: 03/22/2019 CLINICAL DATA:  Scrotal swelling EXAM: SCROTAL ULTRASOUND DOPPLER ULTRASOUND OF THE TESTICLES TECHNIQUE: Complete ultrasound examination of the testicles, epididymis, and other scrotal structures was performed. Color and spectral Doppler ultrasound were also utilized to evaluate blood flow to the testicles. COMPARISON:  Pelvic radiograph 03/18/2019 FINDINGS: Right testicle Measurements: 3.9 x 2.5 x 2 cm. Striated appearance of the right testicle. No focal testicular mass. Left testicle Measurements: 3.8 x 1.9 x 2.8 cm. Stride appearance of the left testicle. No testicular mass. Right epididymis: Normal in size and appearance. Few small subcentimeter anechoic cysts. Small 6 mm calcification adjacent epididymal head likely a small epididymal appendix. Left epididymis: Normal size and appearance. Anechoic cyst  in the left epididymal body measuring up to 1 cm. Hydrocele:  Moderate bilateral hydroceles. Varicocele:  Small left varicocele.  No right varicocele. Other: Diffuse scrotal skin thickening. Pulsed Doppler interrogation of both testes demonstrates normal low resistance arterial and venous waveforms bilaterally. IMPRESSION: Striated appearance of the testes, typically reflective of interstitial fibrosis and in elderly male patient though given skin thickening and swelling, infection is not fully excluded. Normal Doppler waveforms, difficulty in obtaining the right waveforms is likely related technical factors. Small epididymal appendix adjacent the right epididymis. Bilateral a epididymal cysts. Moderate  bilateral hydroceles.  Small left varicocele. Electronically Signed   By: Lovena Le M.D.   On: 03/22/2019 22:28    Cardiac Studies   2D echo 11/2018 IMPRESSIONS    1. The left ventricle has mild-moderately reduced systolic function, with an ejection fraction of 40-45%. The cavity size was normal. There is severe asymmetric left ventricular hypertrophy. Left ventricular diastolic Doppler parameters are consistent  with pseudonormalization.  2. The right ventricle has moderately reduced systolic function. The cavity was normal. There is no increase in right ventricular wall thickness. Right ventricular systolic pressure is moderately elevated.  3. Left atrial size was moderately dilated.  4. Mitral valve regurgitation is mild to moderate by color flow Doppler. No evidence of mitral valve stenosis.  5. The aortic valve is abnormal. Mild thickening of the aortic valve. Mild calcification of the aortic valve. No stenosis of the aortic valve.  6. The aorta is normal unless otherwise noted.  7. The aortic root and ascending aorta are normal in size and structure.  8. There is mild dilatation of the ascending aorta.  9. Pulmonary hypertension is moderate. 10. The atrial septum is grossly normal. 11. The average left ventricular global longitudinal strain is -7.8 %.  Patient Profile     85 y.o. male with a hx of Dementia, DVT, CKD stage III, DM, HTN, HLD, bilateral AKAs, PAD, Chronic combined Heart Failure, Paroxysmal afib on eliquis  who is being seen for the evaluation of acute heart failure at the request of Dr. Philipp Ovens  Assessment & Plan    Acute on Chronic Heart Failure Patient presented with scrotal swelling, upper leg edema. BNP >2000. CXR with vascular congestion with moderate left effusion. - Last EF in August was 40-45%, improved from in March 2020 15-20%. - Patient had been diuresing well with IV lasix 40 mg BID. Yesterday pressures dropped to systolic [id number]. Iv lasix was  held. - he put out 400cc yesterday and is now net neg 5L - creatinine bumped to 1.53 today likely related to passive congestion from continued volume overload - Weight down 188 > 180lbs - BNP improved but remains elevated - Scrotum is still large>>scrotal US with bilateral moderate hydroceles - BP still soft - will try gentle diuresis with Lasix 20mg  IV BID - it will likely take some time for scrotal swelling to resolve - unclear how much hydroceles are playing in swelling - repeate2D echo to make sure LV function has not worsened  Paroxysmal Afib - maintaining NSR/sinus brady - rate controlled  - BB on hold due to soft BP - CHADSVASC 5 (HTN, Age, DM, Vascular, CHF) - continue Eliquis  HTN - BP remains soft - continue to hold ARB and BB  Scrotal swelling - ?reportedly unchanged - scrotal sling - US scrotum showed moderate bilateral hydroceles - per IM      For questions or updates, please contact [facility] Please consult [email or web address] for contact  info under Cardiology/STEMI.      Signed, Fransico Him, MD  03/23/2019, 7:00 AM

## 2019-03-23 NOTE — Progress Notes (Signed)
  Echocardiogram 2D Echocardiogram has been performed.  [name] [name] 03/23/2019, 10:47 AM

## 2019-03-23 NOTE — Progress Notes (Signed)
Internal Medicine Attending Note:  I have seen and evaluated this patient and I have discussed the plan of care with the house staff. Please see their note for complete details. I concur with their findings.  Scrotal swelling improved today compared to admission. BP improved. Appreciate cardiology's assistance. Resume low dose IV lasix today.   Velna Ochs, MD 03/23/2019, 10:42 AM

## 2019-03-23 NOTE — Plan of Care (Signed)
  Problem: Clinical Measurements: Goal: Respiratory complications will improve Outcome: Progressing Note: No s/s of respiratory complications noted.  Stable on room air. Goal: Cardiovascular complication will be avoided Outcome: Progressing Note: No s/s of cardiovascular complication noted.  NSR on telemetry.   

## 2019-03-24 DIAGNOSIS — I9589 Other hypotension: Secondary | ICD-10-CM

## 2019-03-24 DIAGNOSIS — E861 Hypovolemia: Secondary | ICD-10-CM

## 2019-03-24 DIAGNOSIS — I5043 Acute on chronic combined systolic (congestive) and diastolic (congestive) heart failure: Secondary | ICD-10-CM

## 2019-03-24 LAB — BASIC METABOLIC PANEL
Anion gap: 8 (ref 5–15)
BUN: 21 mg/dL (ref 8–23)
CO2: 32 mmol/L (ref 22–32)
Calcium: 8.3 mg/dL — ABNORMAL LOW (ref 8.9–10.3)
Chloride: 96 mmol/L — ABNORMAL LOW (ref 98–111)
Creatinine, Ser: 1.51 mg/dL — ABNORMAL HIGH (ref 0.61–1.24)
GFR calc Af Amer: 46 mL/min — ABNORMAL LOW (ref >60)
GFR calc non Af Amer: 40 mL/min — ABNORMAL LOW (ref >60)
Glucose, Bld: 92 mg/dL (ref 70–99)
Potassium: 4.1 mmol/L (ref 3.5–5.1)
Sodium: 136 mmol/L (ref 135–145)

## 2019-03-24 NOTE — Progress Notes (Addendum)
Subjective: Pt seen at the bedside this morning. States he is feeling well. Asking about plans for discharge so that he can go home to shave and put lotion on over his psoriasis plaques. Explained that he remains in the hospital to remove extra fluid from him and monitor his blood pressure. Pt expressed understanding, and had no further questions.   Objective:  Vital signs in last 24 hours: Vitals:   03/23/19 0918 03/23/19 1559 03/23/19 2047 03/24/19 0417  BP: 108/73  101/70 (!) 91/50  Pulse: 64 63 61 62  Resp:  20    Temp:  98.6 F (37 C) 98.3 F (36.8 C) 98.7 F (37.1 C)  TempSrc:  Oral Oral Oral  SpO2: 100% 100% 100% 96%  Weight:    80.2 kg   Physical Exam Vitals signs and nursing note reviewed.  Constitutional:      General: He is not in acute distress.    Appearance: He is not ill-appearing.  Neck:     Vascular: No JVD.  Genitourinary:    Comments: Scrotal swelling much improved from admission. Musculoskeletal:     Comments: Mild pitting edema on dependant lower extremities (posterior thighs and hips)  Skin:    General: Skin is warm and dry.  Neurological:     Mental Status: He is alert.    Assessment/Plan:  Active Problems:   CHF exacerbation (HCC)   Scrotal swelling   Hypotension   PAF (paroxysmal atrial fibrillation) [facility])   [name] [name] a 85 year old male with asignificant PMHof DVT, psoriasis, type 2 diabetes, hypertension, PAD status post bilateral AKA's, chronic systolic heart failure with an EF of 40 to 45% who presented with 1-2 week history oftesticular and penile swelling,BNP >2000,moderate pulmonary vascular congestion on CXR, and bibasilar crackles on exam consistent with an acute on chronic heart failure exacerbation.   Acute on chronic systolic heart failure exacerbation Scrotal swelling Echo on8/2020with LVEF 40-45%, severe asymmetric LVH, moderately reduced RV systolic function, and moderate pulmonary HTN.Weightof77kgdocumented in  the chart on7/2020, 85kgon admission. Scrotal ultrasound significant for bilateral moderate hydroceles and small left varicocele. Repeat BNP elevated at 1888.    Pt remains hypotensive with SBP 80-90s. Repeat echo on 11/25 similar to prior with LVEF 45%, moderate LVH, elevated LVEDP, mildly reducted RV systolic function, moderate pulmonary HTN, and severe left atrial dilation.  - holding home antihypertensives  - net -860cc yesterday with weight down 1.7kg - discrepancy noted - overall net -6L this admission - Cr holding at 1.5 -continue strict I&Oswith dailyweightsand1851mL fluid restriction -monitordaily electrolytes  Cardiology consulted, appreciate recommendations             - general diuresis with 20mg IV furosemide BID  - RA pressure 78mmHg indicating volume overload             - continue holding homemetoprolol50mg  dailyandlosartan 50mg  daily  - potential to transition to oral diuresis tomorrow  - continue use of scrotal sling - home health PT/OT/RN/aideat discharge - will make social work aware of anticipated discharge when ready due to transportation needs   Hx ofA.fib w/ RVR Currentlybradycardicin sinus.Rate control withmetoprolol at home. CHADSVASC of 5. -continue home apixaban 5mg  BID - holdingmetoprolol100mg  daily as above  HTN -holdinghomelosartan 50mg  daily - holding homemetoprololto 50mg  daily  Psoriasis Encouraged pt to use triamcinolone cream that was available in the room on the bedside table.  - moved to pt's tray table -triamcinolone 0.025% creamBID   Diet:heart healthy Fluids:none DVTppx:home apixaban 5mg  BID CODE STATUS:PARTIAL  Dispo: Anticipated dischargepending need for additional diuresis    [name] Horns, [name] [name] 03/24/2019, 7:26 AM Pager: [phone]

## 2019-03-24 NOTE — Progress Notes (Signed)
Progress Note  Patient Name: [name] [name] Date of Encounter: 03/24/2019  Primary Cardiologist: Skeet Latch, MD   Subjective   Feeling well.  No shortness of breath.  Only complaint is pruritis 2/2 psoriasis and discomfort from bed sore.   Inpatient Medications    Scheduled Meds:  apixaban  5 mg Oral BID   furosemide  20 mg Intravenous BID   gabapentin  200 mg Oral BID   gabapentin  300 mg Oral QHS   sodium chloride flush  3 mL Intravenous Q12H   triamcinolone   Topical BID   Continuous Infusions:  PRN Meds: acetaminophen **OR** acetaminophen, ondansetron **OR** ondansetron (ZOFRAN) IV, senna-docusate   Vital Signs    Vitals:   03/23/19 0918 03/23/19 1559 03/23/19 2047 03/24/19 0417  BP: 108/73  101/70 (!) 91/50  Pulse: 64 63 61 62  Resp:  20    Temp:  98.6 F (37 C) 98.3 F (36.8 C) 98.7 F (37.1 C)  TempSrc:  Oral Oral Oral  SpO2: 100% 100% 100% 96%  Weight:    80.2 kg    Intake/Output Summary (Last 24 hours) at 03/24/2019 1026 Last data filed at 03/24/2019 0915 Gross per 24 hour  Intake 343 ml  Output 1200 ml  Net -857 ml   Last 3 Weights 03/24/2019 03/23/2019 03/22/2019  Weight (lbs) 176 lb 12.9 oz 180 lb 8.9 oz 185 lb 3 oz  Weight (kg) 80.2 kg 81.9 kg 84 kg      Telemetry    Sinus rhythm.  PACs, PVCs.  - Personally Reviewed  ECG    n/a - Personally Reviewed  Physical Exam   VS:  BP (!) 91/50 (BP Location: Left Arm)    Pulse 62    Temp 98.7 F (37.1 C) (Oral)    Resp 20    Wt 80.2 kg    SpO2 96%    BMI 53.95 kg/m  , BMI Body mass index is 53.95 kg/m. GENERAL:  Well appearing.  No acute distress. HEENT: Pupils equal round and reactive, fundi not visualized, oral mucosa unremarkable NECK:  no jugular venous distention, waveform within normal limits, carotid upstroke brisk and symmetric LUNGS:  Diminished at L base HEART:  RRR.  PMI not displaced or sustained,S1 and S2 within normal limits, no S3, no S4, no clicks, no rubs, no  murmurs ABD:  Flat, positive bowel sounds normal in frequency in pitch, no bruits, no rebound, no guarding, no midline pulsatile mass, no hepatomegaly, no splenomegaly EXT:  Bilateral AKAs SKIN:  Severe, diffuse psoriasis plaques. GU: +scrotal edema NEURO:  Cranial nerves II through XII grossly intact, motor grossly intact throughout PSYCH:  Cognitively intact, oriented to person place and time   Labs    High Sensitivity Troponin:  No results for input(s): TROPONINIHS in the last 720 hours.    Chemistry Recent Labs  Lab 03/22/19 0438 03/23/19 0329 03/24/19 0318  NA 138 138 136  K 4.0 4.5 4.1  CL 96* 99 96*  CO2 32 28 32  GLUCOSE 94 93 92  BUN 23 25* 21  CREATININE 1.45* 1.53* 1.51*  CALCIUM 8.7* 8.4* 8.3*  GFRNONAA 42* 39* 40*  GFRAA 49* 46* 46*  ANIONGAP 10 11 8      Hematology Recent Labs  Lab 03/18/19 1537  WBC 3.4*  RBC 4.18*  HGB 13.8  HCT 42.7  MCV 102.2*  MCH 33.0  MCHC 32.3  RDW 16.1*  PLT 162    BNP Recent Labs  Lab 03/18/19  1537 03/22/19 1430  BNP 2,136.8* 1,887.8*     DDimer No results for input(s): DDIMER in the last 168 hours.   Radiology    US Scrotum W/doppler  Result Date: 03/22/2019 CLINICAL DATA:  Scrotal swelling EXAM: SCROTAL ULTRASOUND DOPPLER ULTRASOUND OF THE TESTICLES TECHNIQUE: Complete ultrasound examination of the testicles, epididymis, and other scrotal structures was performed. Color and spectral Doppler ultrasound were also utilized to evaluate blood flow to the testicles. COMPARISON:  Pelvic radiograph 03/18/2019 FINDINGS: Right testicle Measurements: 3.9 x 2.5 x 2 cm. Striated appearance of the right testicle. No focal testicular mass. Left testicle Measurements: 3.8 x 1.9 x 2.8 cm. Stride appearance of the left testicle. No testicular mass. Right epididymis: Normal in size and appearance. Few small subcentimeter anechoic cysts. Small 6 mm calcification adjacent epididymal head likely a small epididymal appendix. Left  epididymis: Normal size and appearance. Anechoic cyst in the left epididymal body measuring up to 1 cm. Hydrocele:  Moderate bilateral hydroceles. Varicocele:  Small left varicocele.  No right varicocele. Other: Diffuse scrotal skin thickening. Pulsed Doppler interrogation of both testes demonstrates normal low resistance arterial and venous waveforms bilaterally. IMPRESSION: Striated appearance of the testes, typically reflective of interstitial fibrosis and in elderly male patient though given skin thickening and swelling, infection is not fully excluded. Normal Doppler waveforms, difficulty in obtaining the right waveforms is likely related technical factors. Small epididymal appendix adjacent the right epididymis. Bilateral a epididymal cysts. Moderate bilateral hydroceles.  Small left varicocele. Electronically Signed   By: Lovena Le M.D.   On: 03/22/2019 22:28    Cardiac Studies   2D echo 11/2018 IMPRESSIONS   1. The left ventricle has mild-moderately reduced systolic function, with an ejection fraction of 40-45%. The cavity size was normal. There is severe asymmetric left ventricular hypertrophy. Left ventricular diastolic Doppler parameters are consistent  with pseudonormalization. 2. The right ventricle has moderately reduced systolic function. The cavity was normal. There is no increase in right ventricular wall thickness. Right ventricular systolic pressure is moderately elevated. 3. Left atrial size was moderately dilated. 4. Mitral valve regurgitation is mild to moderate by color flow Doppler. No evidence of mitral valve stenosis. 5. The aortic valve is abnormal. Mild thickening of the aortic valve. Mild calcification of the aortic valve. No stenosis of the aortic valve. 6. The aorta is normal unless otherwise noted. 7. The aortic root and ascending aorta are normal in size and structure. 8. There is mild dilatation of the ascending aorta. 9. Pulmonary hypertension is  moderate. 10. The atrial septum is grossly normal. 11. The average left ventricular global longitudinal strain is -7.8 %.  Patient Profile     85 y.o. male with PAF, PAD s/p bilateral AKA, hypertension, hyperlipidemia, CKD III, dementia, and prior DVT admitted with acute on chronic systolic and diastolic heart failure.  Assessment & Plan    # Acute on chronic systolic and diastolic heart failure: Volume status is very difficult to assess on exam.  Diuresis has slowed.  Lasix was reduced due to worsening renal function.  On echo yesterday LVEF is stable.  RA pressure 15 mmHg, indicating he is still volume overloaded.  He was net -800 mL yesterday after lasix was reduced to 20mg .  He still has edema but BP is soft.  Continue lasix at current dosing.  He may be able to transition to oral dosing tomorrow.  # PAF: Maintaining sinus rhythm.  Continue Eliquis.   # Hypertension:  Antihypertensives on hold 2/2 hypotension.  For questions or updates, please contact [place] Please consult [email or web address] for contact info under        Signed, Skeet Latch, MD  03/24/2019, 10:26 AM

## 2019-03-25 LAB — BASIC METABOLIC PANEL
Anion gap: 10 (ref 5–15)
BUN: 20 mg/dL (ref 8–23)
CO2: 31 mmol/L (ref 22–32)
Calcium: 8.7 mg/dL — ABNORMAL LOW (ref 8.9–10.3)
Chloride: 96 mmol/L — ABNORMAL LOW (ref 98–111)
Creatinine, Ser: 1.31 mg/dL — ABNORMAL HIGH (ref 0.61–1.24)
GFR calc Af Amer: 55 mL/min — ABNORMAL LOW (ref >60)
GFR calc non Af Amer: 48 mL/min — ABNORMAL LOW (ref >60)
Glucose, Bld: 100 mg/dL — ABNORMAL HIGH (ref 70–99)
Potassium: 4.1 mmol/L (ref 3.5–5.1)
Sodium: 137 mmol/L (ref 135–145)

## 2019-03-25 MED ORDER — FUROSEMIDE 20 MG PO TABS
20.0000 mg | ORAL_TABLET | Freq: Every evening | ORAL | Status: DC
  Administered 2019-03-25: 17:00:00 20 mg via ORAL
  Filled 2019-03-25: qty 1

## 2019-03-25 MED ORDER — FUROSEMIDE 40 MG PO TABS
40.0000 mg | ORAL_TABLET | Freq: Every morning | ORAL | Status: DC
  Administered 2019-03-25 – 2019-03-26 (×2): 40 mg via ORAL
  Filled 2019-03-25 (×2): qty 1

## 2019-03-25 MED ORDER — FUROSEMIDE 40 MG PO TABS: 20.0000 mg | ORAL_TABLET | Freq: Every day | ORAL | 5 refills | Status: DC

## 2019-03-25 MED ORDER — FUROSEMIDE 40 MG PO TABS: 40.0000 mg | ORAL_TABLET | Freq: Two times a day (BID) | ORAL | Status: DC

## 2019-03-25 NOTE — Progress Notes (Signed)
Spoke with Patient's daughter, [name] concerning transportation. Daughter states she has no way to transport pt's wheelchair and will need transportation set up. I have made the social worker aware.

## 2019-03-25 NOTE — Care Management (Addendum)
Spoke to patient's daughter Zackie Strait via phone.  She is aware TOC SW called  [employer] wheelchair vans and [employer], and due to holiday weekend they are unable to assist with transport today   She is aware patient is discharged today and [facility] will not transport patient's wheelchair.  Tammy is calling friends / family to see if someone can pick up her father's wheelchair today .  Magdalen Spatz RN

## 2019-03-25 NOTE — Progress Notes (Signed)
  Date: 03/25/2019  Patient name: [name] [name]  Medical record number: [id number]  Date of birth: 1927-04-25        I have seen and evaluated this patient and I have discussed the plan of care with the house staff. Please see their note for complete details. I concur with their findings.  Bartholomew Crews, MD 03/25/2019, 1:36 PM

## 2019-03-25 NOTE — TOC Transition Note (Addendum)
Transition of Care [facility]) - CM/SW Discharge Note   Patient Details  Name: [name] [name] MRN: [id number] Date of Birth: [birth year]  Transition of [facility]) CM/SW Contact:  Alexander Mt, [place] Phone Number: 03/25/2019, 12:13 PM   Clinical Narrative:    4:00pm- Per RN, transport company just called pt back and said due to staffing they could not pick him up today. Will request Saturday discharge due to inability to get pt home at this time.   3:48pm- CSW had also reached out to [place] and [facility] CSW to see if Lucianne Lei in [employer] could be used to transport pt home. Unable to make contact.   Per RN pt daughter has found a ride for pt to get home. Pick up for 5:40pm; pt RN updated that [facility] has been arranged. No further needs identified.   12:13pm- Aware pt stable for discharge, [place] orders confirmed with [employer]. The orders have been entered and face to face complete. Hand off indicates [facility] is pt primary transportation- [facility] does not pick pts up at hospital.   CSW spoke with pt bedside RN; pt in wheelchair. Pt daughter is at home to let pt in however she is unable to pick pt up per RN. Offered cab voucher to pt/pt RN however it was clarified that pt is in a power chair which would not be able to fit in traditional taxi. Called and spoke with [employer] wheelchair vans and [employer], and due to holiday weekend they are unable to assist with transport today. CSW has reached out to [place] for further support.   HIPAA compliant message left with pt daughter [name] at [phone] to see if someone would be able to pick up wheelchair if we [facility] pt home. At this time still unresolved transportation needs.   Final next level of care: [place] Barriers to Discharge: Transportation   Patient Goals and CMS Choice Patient states their goals for this hospitalization and ongoing recovery are:: Pt wants to return home CMS  Medicare.gov Compare Post Acute Care list provided to:: Patient Choice offered to / list presented to : Adult Children  Discharge Placement Patient to be transferred to facility by: requires transport home Name of family member notified: pt daughter by bedside RN on 11/27.    Discharge Plan and Services In-house Referral: Clinical Social Work Discharge Planning Services: NA Post Acute Care Choice: Home Health          DME Arranged: N/A DME Agency: NA HH Arranged: OT, PT, RN [place] Agency: [place] Date [facility] Agency Contacted: 03/25/19 Time [place]: [id number] Representative spoke with at [place]: Beryle Beams   Readmission Risk Interventions No flowsheet data found.

## 2019-03-25 NOTE — Discharge Summary (Signed)
Name: [name] [name] MRN: [id number] DOB: 03/14/1927 85 y.o. PCP: [name] Blackbird, MD  Date of Admission: 03/18/2019  1:37 PM Date of Discharge: 03/26/2019 Attending Physician: [name] Ochs, MD  Discharge Diagnosis: 1. CHF exacerbation 2. Scrotal swelling 3. Hypotension  Discharge Medications: Allergies as of 03/25/2019   No Known Allergies     Medication List    STOP taking these medications   losartan 50 MG tablet Commonly known as: COZAAR   metoprolol succinate 100 MG 24 hr tablet Commonly known as: TOPROL-XL     TAKE these medications   apixaban 5 MG Tabs tablet Commonly known as: ELIQUIS Take 1 tablet (5 mg total) by mouth 2 (two) times daily.   fluocinolone 0.025 % cream Commonly known as: SYNALAR Apply topically 2 (two) times daily. To the scalp and sparingly to the face   furosemide 40 MG tablet Commonly known as: LASIX Take 0.5 tablets (20 mg total) by mouth daily. Take 2 pills (40mg ) by mouth in the morning and 1 pill (20mg ) by mouth in the evening. What changed:   how much to take  additional instructions   gabapentin 100 MG capsule Commonly known as: NEURONTIN PATIENT TAKES 2 CAPSULES IN THE MORNING AND 2 CAPSULES EVERY EVENING AND 3 CAPSULES AT BEDTIME TIME What changed:   how much to take  how to take this  when to take this   hydrocortisone cream 1 % Apply topically 2 (two) times daily.   Potassium Chloride ER 20 MEQ Tbcr Take 20 mEq by mouth daily.       Disposition and follow-up:   GeorgeGeorge [name] was discharged from [facility] in Stable condition.  At the hospital follow up visit please address:  1.  CHF exacerbation Repeat echo with EF LVEF 45%, moderate LVH, elevated LVEDP, mildly reducted RV systolic function, RA pressure 65mmHg, moderate pulmonary HTN, and severe left atrial dilation.  - discharge wt 73.9kg - continue home diuresis with furosemide 40mg  in the morning and 20mg  in the evening - encourage  low salt diet and medication compliance  - holding home metoprolol and lostartan secondary to hypotension, can consider adding back after acute illness  - discharged with home health PT/OT/RN/nurse aide  A fib - continue Eliquis 5mg  BID  2.  Labs / imaging needed at time of follow-up: repeat BMP in 1 week  3.  Pending labs/ test needing follow-up: NONE  Follow-up Appointments: Follow-up with Dr. Oval [name] in 2 weeks  Hospital Course by problem list: 1. [name] [name] a 85 year old male with asignificant PMHof DVT, psoriasis, type 2 diabetes, hypertension, PAD status post bilateral AKA's, chronic systolic heart failure with an EF of 40 to 45% who presented with 1-2 week history oftesticular and penile swelling,BNP >2000,moderate pulmonary vascular congestion on CXR, and bibasilar crackles on exam consistent with an acute on chronic heart failure exacerbation. Wt on admission was 85kg. Pt initially diuresed well with 6L out, however became hypotensive on hospital day 4. Additional diuresis and antihypertensives were held at this time. Cardiology was consulted who found a repeat BNP was still elevated to >1800, scrotal ultrasound with moderate bilateral hydroceles and a small L varicocele, and a repeat echo with stable LVEF of 45% and elevated RA pressure of 39mmHg. Pt was restarted on gentle diuresis with 20mg  IV BID and diuresed another 800cc. He was able to diuresis 9.7L over the course of his admission, with a discharge weight of 73.9kg which is much improved from a ?dry weight  of 77kg from 10/2018. Pt was discharge on an oral regimen of furosemide 40mg  in the morning and 20mg  in the evening and holding his antihypertensives (metoprolol and losartan). He was scheduled for follow-up with cardiology in 2 weeks and a repeat BMP in 1 week to assess kidney function.  Discharge Vitals:   BP (!) 87/58 (BP Location: Left Arm)   Pulse 66   Temp 98.6 F (37 C) (Oral)   Resp 15   Wt 77.6 kg   SpO2 100%    BMI 52.21 kg/m   Pertinent Labs, Studies, and Procedures:  CBC Latest Ref Rng & Units 03/18/2019 01/12/2019 11/25/2018  WBC 4.0 - 10.5 K/uL 3.4(L) 3.1(L) 2.9(L)  Hemoglobin 13.0 - 17.0 g/dL 13.8 15.5 15.5  Hematocrit 39.0 - 52.0 % 42.7 46.2 45.9  Platelets 150 - 400 K/uL 162 201 169   BMP Latest Ref Rng & Units 03/28/2019 03/25/2019 03/24/2019  Glucose 65 - 99 mg/dL 88 100(H) 92  BUN 10 - 36 mg/dL 18 20 21   Creatinine 0.76 - 1.27 mg/dL 1.29(H) 1.31(H) 1.51(H)  BUN/Creat Ratio 10 - 24 14 - -  Sodium 134 - 144 mmol/L 140 137 136  Potassium 3.5 - 5.2 mmol/L 4.3 4.1 4.1  Chloride 96 - 106 mmol/L 98 96(L) 96(L)  CO2 20 - 29 mmol/L 27 31 32  Calcium 8.6 - 10.2 mg/dL 9.1 8.7(L) 8.3(L)   BNP (last 3 results) Recent Labs    11/23/18 1538 03/18/19 1537 03/22/19 1430  BNP 2,445.3* 2,136.8* 1,887.8*   Recent Results (from the past 240 hour(s))  SARS CORONAVIRUS 2 (TAT 6-24 HRS) Nasopharyngeal Nasopharyngeal Swab     Status: None   Collection Time: 03/18/19  5:13 PM   Specimen: Nasopharyngeal Swab  Result Value Ref Range Status   SARS Coronavirus 2 NEGATIVE NEGATIVE Final    Comment: (NOTE) SARS-CoV-2 target nucleic acids are NOT DETECTED. The SARS-CoV-2 RNA is generally detectable in upper and lower respiratory specimens during the acute phase of infection. Negative results do not preclude SARS-CoV-2 infection, do not rule out co-infections with other pathogens, and should not be used as the sole basis for treatment or other patient management decisions. Negative results must be combined with clinical observations, patient history, and epidemiological information. The expected result is Negative. Fact Sheet for Patients: SugarRoll.be Fact Sheet for Healthcare Providers: https://www.woods-mathews.com/ This test is not yet approved or cleared by the [place] FDA and  has been authorized for detection and/or diagnosis of SARS-CoV-2 by  FDA under an Emergency Use Authorization (EUA). This EUA will remain  in effect (meaning this test can be used) for the duration of the COVID-19 declaration under Section 56 4(b)(1) of the Act, 21 U.S.C. section 360bbb-3(b)(1), unless the authorization is terminated or revoked sooner. Performed at [facility], [place] [address], [place], [place] [address]   Urine culture     Status: Abnormal   Collection Time: 03/18/19  6:34 PM   Specimen: Urine, Random  Result Value Ref Range Status   Specimen Description URINE, RANDOM  Final   Special Requests   Final    NONE Performed at [facility], [place] [address] [place], [place] [address]    Culture >=100,000 COLONIES/mL ENTEROBACTER CLOACAE (A)  Final   Report Status 03/21/2019 FINAL  Final   Organism ID, Bacteria ENTEROBACTER CLOACAE (A)  Final      Susceptibility   Enterobacter cloacae - MIC*    CEFAZOLIN RESISTANT Resistant     CEFTRIAXONE <=  1 SENSITIVE Sensitive     CIPROFLOXACIN <=0.25 SENSITIVE Sensitive     GENTAMICIN <=1 SENSITIVE Sensitive     IMIPENEM <=0.25 SENSITIVE Sensitive     NITROFURANTOIN <=16 SENSITIVE Sensitive     TRIMETH/SULFA <=20 SENSITIVE Sensitive     PIP/TAZO <=4 SENSITIVE Sensitive     * >=100,000 COLONIES/mL ENTEROBACTER CLOACAE    ECHO 03/23/2019 IMPRESSIONS  1. Left ventricular ejection fraction, by visual estimation, is 45%. The left ventricle has mild to moderately decreased function. There is moderately increased left ventricular hypertrophy.  2. Elevated left ventricular end-diastolic pressure.  3. Left ventricular diastolic parameters are indeterminate.  4. Global right ventricle has mildly reduced systolic function.The right ventricular size is normal. No increase in right ventricular wall thickness.  5. Moderately elevated pulmonary artery systolic pressure.  6. Left atrial size was severely dilated.  7. Right atrial size was mildly dilated.  8. The mitral valve is normal in  structure. Mild mitral valve regurgitation. No evidence of mitral stenosis.  9. The tricuspid valve is normal in structure. Tricuspid valve regurgitation mild-moderate. 10. The aortic valve is normal in structure. Aortic valve regurgitation is trivial. Mild aortic valve sclerosis without stenosis. 11. The pulmonic valve was normal in structure. Pulmonic valve regurgitation is trivial. 12. The inferior vena cava is dilated in size with <50% respiratory variability, suggesting right atrial pressure of 15 mmHg.  In comparison to the previous echocardiogram(s): Prior examinations were reviewed in a side by side comparison of images. Left ventricular ejection fraction appears similar.   CXR 03/18/2019 CLINICAL DATA:  Fluid overload  EXAM: PORTABLE CHEST 1 VIEW  COMPARISON:  11/23/2018  FINDINGS: Cardiac enlargement with mild vascular congestion. Moderate left effusion and left lower lobe atelectasis with mild progression. No effusion on the right.  IMPRESSION: Vascular congestion with moderate left effusion and left lower lobe atelectasis.  PELVIS X-RAY 03/18/2019 CLINICAL DATA:  Groin pain, penile swelling.  EXAM: PORTABLE PELVIS 1-2 VIEWS  COMPARISON:  11/15/2012  FINDINGS: Negative for fracture or focal bone lesion. Degenerative change in the lumbar spine. Hip joints appear normal for age.  IMPRESSION: Negative.  ULTRASOUND SCROTUM 03/22/2019 CLINICAL DATA:  Scrotal swelling  EXAM: SCROTAL ULTRASOUND  DOPPLER ULTRASOUND OF THE TESTICLES  TECHNIQUE: Complete ultrasound examination of the testicles, epididymis, and other scrotal structures was performed. Color and spectral Doppler ultrasound were also utilized to evaluate blood flow to the testicles.  COMPARISON:  Pelvic radiograph 03/18/2019  FINDINGS: Right testicle  Measurements: 3.9 x 2.5 x 2 cm. Striated appearance of the right testicle. No focal testicular mass.  Left testicle   Measurements: 3.8 x 1.9 x 2.8 cm. Stride appearance of the left testicle. No testicular mass.  Right epididymis: Normal in size and appearance. Few small subcentimeter anechoic cysts. Small 6 mm calcification adjacent epididymal head likely a small epididymal appendix.  Left epididymis: Normal size and appearance. Anechoic cyst in the left epididymal body measuring up to 1 cm.  Hydrocele:  Moderate bilateral hydroceles.  Varicocele:  Small left varicocele.  No right varicocele.  Other: Diffuse scrotal skin thickening.  Pulsed Doppler interrogation of both testes demonstrates normal low resistance arterial and venous waveforms bilaterally.  IMPRESSION: Striated appearance of the testes, typically reflective of interstitial fibrosis and in elderly male patient though given skin thickening and swelling, infection is not fully excluded.  Normal Doppler waveforms, difficulty in obtaining the right waveforms is likely related technical factors.  Small epididymal appendix adjacent the right epididymis. Bilateral a epididymal cysts.  Moderate bilateral hydroceles.  Small left varicocele.    Discharge Instructions: Discharge Instructions    Diet - low sodium heart healthy   Complete by: As directed    Discharge instructions   Complete by: As directed    Mr. Jarecki,   It was a pleasure taking care of you here in the hospital during your stay.  You were admitted because of extra fluid in your body due to flareup of heart failure.  We treated you with IV medications to get rid of some of the fluid.  We noticed that your water weight had significantly gone down because of the medications.  1.  Going forward, I would like for you to take Lasix 40 mg in the morning and 20 mg in the evening 2.  I want you to hold off on taking the losartan and metoprolol until you see your primary care doctor.  Take care!   Increase activity slowly   Complete by: As directed        Signed: Ladona Horns, MD 03/25/2019, 12:30 PM   Pager: [phone]

## 2019-03-25 NOTE — Progress Notes (Signed)
Progress Note  Patient Name: [name] [name] Date of Encounter: 03/25/2019  Primary Cardiologist: Skeet Latch, MD   Subjective   Scrotal edema improving.  Denies any CP or SOB  Inpatient Medications    Scheduled Meds: . apixaban  5 mg Oral BID  . furosemide  20 mg Oral QPM  . furosemide  40 mg Oral q morning - 10a  . gabapentin  200 mg Oral BID  . gabapentin  300 mg Oral QHS  . sodium chloride flush  3 mL Intravenous Q12H  . triamcinolone   Topical BID   Continuous Infusions:  PRN Meds: acetaminophen **OR** acetaminophen, ondansetron **OR** ondansetron (ZOFRAN) IV, senna-docusate   Vital Signs    Vitals:   03/24/19 0417 03/24/19 1400 03/24/19 1941 03/25/19 0503  BP: (!) 91/50 109/63 109/66 (!) 87/58  Pulse: 62 67 73 66  Resp:  18 18 15   Temp: 98.7 F (37.1 C) 98.1 F (36.7 C) 98.5 F (36.9 C) 98.6 F (37 C)  TempSrc: Oral Oral Oral Oral  SpO2: 96% 97% 100% 100%  Weight: 80.2 kg   77.6 kg    Intake/Output Summary (Last 24 hours) at 03/25/2019 0903 Last data filed at 03/25/2019 0654 Gross per 24 hour  Intake 483 ml  Output 2850 ml  Net -2367 ml   Last 3 Weights 03/25/2019 03/24/2019 03/23/2019  Weight (lbs) 171 lb 1.6 oz 176 lb 12.9 oz 180 lb 8.9 oz  Weight (kg) 77.61 kg 80.2 kg 81.9 kg      Telemetry    Sinus rhythm.  PACs, PVCs.  - Personally Reviewed  ECG    n/a - Personally Reviewed  Physical Exam   VS:  BP (!) 87/58 (BP Location: Left Arm)   Pulse 66   Temp 98.6 F (37 C) (Oral)   Resp 15   Wt 77.6 kg   SpO2 100%   BMI 52.21 kg/m  , BMI Body mass index is 52.21 kg/m.  GEN: Well nourished, well developed in no acute distress HEENT: Normal NECK: No JVD; No carotid bruits LYMPHATICS: No lymphadenopathy CARDIAC:RRR, no murmurs, rubs, gallops RESPIRATORY:  Clear to auscultation without rales, wheezing or rhonchi  ABDOMEN: Soft, non-tender, non-distended MUSCULOSKELETAL: no edema; No deformity  SKIN: Warm and dry NEUROLOGIC:   Alert and oriented x 3 PSYCHIATRIC:  Normal affect    Labs    High Sensitivity Troponin:  No results for input(s): TROPONINIHS in the last 720 hours.    Chemistry Recent Labs  Lab 03/23/19 0329 03/24/19 0318 03/25/19 0336  NA 138 136 137  K 4.5 4.1 4.1  CL 99 96* 96*  CO2 28 32 31  GLUCOSE 93 92 100*  BUN 25* 21 20  CREATININE 1.53* 1.51* 1.31*  CALCIUM 8.4* 8.3* 8.7*  GFRNONAA 39* 40* 48*  GFRAA 46* 46* 55*  ANIONGAP 11 8 10      Hematology Recent Labs  Lab 03/18/19 1537  WBC 3.4*  RBC 4.18*  HGB 13.8  HCT 42.7  MCV 102.2*  MCH 33.0  MCHC 32.3  RDW 16.1*  PLT 162    BNP Recent Labs  Lab 03/18/19 1537 03/22/19 1430  BNP 2,136.8* 1,887.8*     DDimer No results for input(s): DDIMER in the last 168 hours.   Radiology    No results found.  Cardiac Studies   2D echo 11/2018 IMPRESSIONS   1. The left ventricle has mild-moderately reduced systolic function, with an ejection fraction of 40-45%. The cavity size was normal. There is  severe asymmetric left ventricular hypertrophy. Left ventricular diastolic Doppler parameters are consistent  with pseudonormalization. 2. The right ventricle has moderately reduced systolic function. The cavity was normal. There is no increase in right ventricular wall thickness. Right ventricular systolic pressure is moderately elevated. 3. Left atrial size was moderately dilated. 4. Mitral valve regurgitation is mild to moderate by color flow Doppler. No evidence of mitral valve stenosis. 5. The aortic valve is abnormal. Mild thickening of the aortic valve. Mild calcification of the aortic valve. No stenosis of the aortic valve. 6. The aorta is normal unless otherwise noted. 7. The aortic root and ascending aorta are normal in size and structure. 8. There is mild dilatation of the ascending aorta. 9. Pulmonary hypertension is moderate. 10. The atrial septum is grossly normal. 11. The average left ventricular global  longitudinal strain is -7.8 %.  Patient Profile     85 y.o. male with PAF, PAD s/p bilateral AKA, hypertension, hyperlipidemia, CKD III, dementia, and prior DVT admitted with acute on chronic systolic and diastolic heart failure.  Assessment & Plan   1.  Acute on chronic systolic and diastolic heart failure: -Volume status is very difficult to assess on exam.   -On echo EF stable at 45% with diffuse HK (EF 40-45% in 11/2018) -RA pressure 15 mmHg, indicating he is still volume overloaded.   -Diuresis good yesterday.  He put out 2.8L and is net net 8.4L -weight down 17lbs from admit -scrotal edema much improved and essentially resolved -BP soft at 8/58 to 109/6mmHg. -change Lasix to PO today 40mg  qam and 20mg  qpm  2.  PAF: -Maintaining sinus rhythm.   -Continue Eliquis.   # Hypertension:  -Antihypertensives on hold 2/2 hypotension.  [facility] will sign off.   Medication Recommendations:  Lasix 40mg  qam and 20mg  qpm, Eliquis 5mg  BID. Other recommendations (labs, testing, etc):  BMET in 1 week Follow up as an outpatient:  followup in office with Dr. Oval Linsey in 2 weeks    For questions or updates, please contact [place] Please consult [email or web address] for contact info under        Signed, Fransico Him, MD  03/25/2019, 9:03 AM

## 2019-03-25 NOTE — Progress Notes (Signed)
   Subjective: Pt seen at the bedside this morning. States he is feeling well. No acute concerns at this time.  Objective:  Vital signs in last 24 hours: Vitals:   03/24/19 0417 03/24/19 1400 03/24/19 1941 03/25/19 0503  BP: (!) 91/50 109/63 109/66 (!) 87/58  Pulse: 62 67 73 66  Resp:  18 18 15   Temp: 98.7 F (37.1 C) 98.1 F (36.7 C) 98.5 F (36.9 C) 98.6 F (37 C)  TempSrc: Oral Oral Oral Oral  SpO2: 96% 97% 100% 100%  Weight: 80.2 kg   77.6 kg   Physical Exam Vitals signs and nursing note reviewed.  Constitutional:      General: He is not in acute distress.    Appearance: He is not ill-appearing.  Cardiovascular:     Rate and Rhythm: Normal rate and regular rhythm.     Heart sounds: Normal heart sounds.  Pulmonary:     Effort: Pulmonary effort is normal.  Genitourinary:    Comments: Scrotal swelling continues to improve Musculoskeletal:     Comments: No lower extremity edema  Skin:    General: Skin is warm and dry.  Neurological:     Mental Status: He is alert.    Assessment/Plan:  Active Problems:   CHF exacerbation (HCC)   Scrotal swelling   Hypotension   PAF (paroxysmal atrial fibrillation) [facility])   Mr. Whipp a 85 year old male with asignificant PMHof DVT, psoriasis, type 2 diabetes, hypertension, PAD status post bilateral AKA's, chronic systolic heart failure with an EF of 40 to 45% who presented with 1-2 week history oftesticular and penile swelling,BNP >2000,moderate pulmonary vascular congestion on CXR, and bibasilar crackles on exam consistent with an acute on chronic heart failure exacerbation.   Acute on chronic systolic heart failure exacerbation Scrotal swelling Echo on8/2020with LVEF 40-45%, severe asymmetric LVH, moderately reduced RV systolic function, and moderate pulmonary HTN.Weightof77kgdocumented in the chart on7/2020, 85kgon admission. Scrotal ultrasound significant for bilateral moderate hydroceles and small left  varicocele.  Pt remains hypotensive with SBP 80-90s. Repeat echo on 11/25 similar to prior with LVEF 45%, moderate LVH, elevated LVEDP, mildly reducted RV systolic function, moderate pulmonary HTN, and severe left atrial dilation.  - holding home antihypertensives  - net -2.3L yesterday with weight down 2.6kg - overall net -8.2L this admission, with weight approaching baseline at 77.6kg - Cr mildly improved today 1.3 << 1.5 -continue strict I&Oswith dailyweightsand1815mL fluid restriction -monitordaily electrolytes  Cardiologyconsulted, appreciate recommendations - diuresis with PO furosemide 40mg  qam and 20mg  qpm             - BMP in 1 week - follow-up with Dr. Oval Linsey in 2 weeks             - continue home apixaban 5mg  BID  - continueuse ofscrotal sling - home health PT/OT/RN/aideat discharge  Hx ofA.fib w/ RVR Currentlybradycardicin sinus.Rate control withmetoprolol at home. CHADSVASC of 5. -continue home apixaban 5mg  BID - holdingmetoprolol100mg  daily as above  HTN -holdinghomelosartan 50mg  daily -holdinghomemetoprololto 50mg  daily  Psoriasis -triamcinolone 0.025% creamBID   Diet:heart healthy Fluids:none DVTppx:home apixaban 5mg  BID CODE STATUS:PARTIAL   Dispo: Anticipated discharge home today. With home health PT/OT/RN/aide and follow-up appointment in 2 weeks.  Ladona Horns, MD 03/25/2019, 6:48 AM Pager: [phone]

## 2019-03-25 NOTE — Progress Notes (Signed)
RN attempted to set up transportation; however, transport is unable to accommodate same day transport and will not be able to transport until tomorrow. I have made patient's daughter [name] [name] aware and  She is very understanding of this situation. I have updated the case manager, Nira Conn, Social worker Monia Pouch and MD  Will continue to monitor and update patient and family.

## 2019-03-25 NOTE — Care Management Important Message (Signed)
Important Message  Patient Details  Name: [name] [name] MRN: [id number] Date of Birth: June 30, 1926   Medicare Important Message Given:  Yes     Shelda Altes 03/25/2019, 11:21 AM

## 2019-03-25 NOTE — Progress Notes (Signed)
PT Cancellation Note  Patient Details Name: [name] [name] MRN: [id number] DOB: 1926-05-07   Cancelled Treatment:    Reason Eval/Treat Not Completed: Patient declined, no reason specified. Pt declined need for PT session today as he reports he is d/c later today and does not want time spent with therapy to delay his d/c. Pt educated on the importance of mobility, even with d/c plans, but pt continued to decline services. Pt will continue to follow and treat as time/schedule allow.   Mickey Farber, PT, DPT   Acute Rehabilitation Department [phone]   Otho Bellows 03/25/2019, 2:08 PM

## 2019-03-25 NOTE — Progress Notes (Signed)
Spoke with CM to set up transportation to pt's home. Stated she will have transportation set up and get back with me ASAP.   RN called pt's daughter Barin Shirah and confirmed she would be at the pt's home all day and be there when patient arrives. Discharged education provided and has no further questions at this time.  Will continue to monitor.

## 2019-03-25 NOTE — Progress Notes (Signed)
Transportation set up for 10am on 03/26/19. I have updated patient and Tammie (Patient's daughter).

## 2019-03-26 NOTE — Progress Notes (Signed)
Occupational Therapy Treatment Patient Details Name: [name] [name] MRN: [id number] DOB: 11-02-1926 Today's Date: 03/26/2019    History of present illness Patient is a 85 y/o male who presents with testicular and penile swelling, BNP >2000. CXR-moderate pulmonary vascular congestion. Admitted with acute on chronic heart failure exacerbation.  PMH includes PVD s/p bil AKAs, HTN, DM, gout, CKD, DVT, heart failure.   OT comments  Patient in bed upon entry, agreeable for OT to assist reposition.  Requires min to roll and mod assist to scoot towards HOB in trendelenburg position; increased time and effort with cueing for technique.  Patient seen for scrotal sling assessment, but noted sling in trash with pt unaware of "what happened to sling".  Pt denies edema or pain, noted improved edema from last session; recommended discontinuation of sling as patient reports at baseline and unable to manage sling without assist.  Will follow acutely in order for ADLs, transfers and to ensure scrotal edema maintains at baseline.    Follow Up Recommendations  Home health OT;Supervision - Intermittent    Equipment Recommendations  None recommended by OT    Recommendations for Other Services      Precautions / Restrictions Precautions Precautions: Fall Precaution Comments: BiL AKAs Restrictions Weight Bearing Restrictions: No       Mobility Bed Mobility Overal bed mobility: Needs Assistance Bed Mobility: Rolling Rolling: Min assist         General bed mobility comments: increased time and effort, with cueing for technique rolling in bed to change pads with min assist and mod assist to scoot towards HOB/reposition in trendelenburg position  Transfers                      Balance                                           ADL either performed or assessed with clinical judgement   ADL Overall ADL's : Needs assistance/impaired                                      Functional mobility during ADLs: Moderate assistance(bed mobility only) General ADL Comments: therapist present for scrotal sling check, scrotal sling found in trash can.  Pt reporting "you can't put me in charge of that, I don't know what is going on here". Pt denies scrotal edema, noted improved from last OT session and therapist discontinued sling as pt dcing home today.     Vision       Perception     Praxis      Cognition Arousal/Alertness: Awake/alert Behavior During Therapy: WFL for tasks assessed/performed Overall Cognitive Status: No family/caregiver present to determine baseline cognitive functioning                                 General Comments: anticipate baseline        Exercises     Shoulder Instructions       General Comments      Pertinent Vitals/ Pain       Pain Assessment: Faces Faces Pain Scale: Hurts little more Pain Location: generalized Pain Descriptors / Indicators: Grimacing;Guarding Pain Intervention(s): Monitored during session;Repositioned  Home Living  Prior Functioning/Environment              Frequency  Min 2X/week        Progress Toward Goals  OT Goals(current goals can now be found in the care plan section)  Progress towards OT goals: Progressing toward goals  Acute Rehab OT Goals Patient Stated Goal: home today OT Goal Formulation: With patient  Plan Discharge plan remains appropriate;Frequency remains appropriate    Co-evaluation                 AM-PAC OT "6 Clicks" Daily Activity     Outcome Measure   Help from another person eating meals?: None Help from another person taking care of personal grooming?: None Help from another person toileting, which includes using toliet, bedpan, or urinal?: A Little Help from another person bathing (including washing, rinsing, drying)?: A Little Help from another person to put on and  taking off regular upper body clothing?: A Little Help from another person to put on and taking off regular lower body clothing?: A Little 6 Click Score: 20    End of Session    OT Visit Diagnosis: Muscle weakness (generalized) (M62.81)   Activity Tolerance Patient tolerated treatment well   Patient Left in bed;with call bell/phone within reach;with bed alarm set   Nurse Communication Mobility status        Time: [id number] OT Time Calculation (min): 18 min  Charges: OT General Charges $OT Visit: 1 Visit OT Treatments $Self Care/Home Management : 8-22 mins  Delight Stare, [place] Pager [phone] Office [phone]    Delight Stare 03/26/2019, 8:48 AM

## 2019-03-26 NOTE — Progress Notes (Signed)
Pt had a 6 beat run of PVC at 2:43. Pt was asleep. Will continue to monitor.

## 2019-03-26 NOTE — TOC Transition Note (Signed)
Transition of Care [facility]) - CM/SW Discharge Note   Patient Details  Name: [name] [name] MRN: [id number] Date of Birth: [birth year]  [facility] CM/SW Contact:  Alexander Mt, [place] Phone Number: 03/26/2019, 9:31 AM   Clinical Narrative:    CSW spoke with bedside RN and [employer] data processor. Transportation due to power chair has been complicated.  Pt is set up with ride home at 11:30am. He will be picked up by a [facility] per [employer] (operated by [employer] They can only have one contact number and at this time that is Tamie's cell number. Pt RN aware that pt needs to be taken to front entrance for pick up at that time.   If pt has any issues dispatch can be called at [phone].    Final next level of care: [place] Barriers to Discharge: Transportation   Patient Goals and CMS Choice Patient states their goals for this hospitalization and ongoing recovery are:: Pt wants to return home CMS Medicare.gov Compare Post Acute Care list provided to:: Patient Choice offered to / list presented to : Adult Children  Discharge Placement Patient to be transferred to facility by: requires transport home Name of family member notified: pt daughter    Discharge Plan and Services In-house Referral: Clinical Social Work Discharge Planning Services: NA Post Acute Care Choice: Home Health          DME Arranged: N/A DME Agency: NA       HH Arranged: OT, PT, RN [place] Agency: Rule Date [place]: 03/25/19 Time [place]ian: 1212 Representative spoke with at [place]: Beryle Beams  Social Determinants of Health (SDOH) Interventions     Readmission Risk Interventions No flowsheet data found.

## 2019-03-28 ENCOUNTER — Encounter: Payer: Self-pay | Admitting: Cardiovascular Disease

## 2019-03-28 ENCOUNTER — Other Ambulatory Visit: Payer: Self-pay

## 2019-03-28 ENCOUNTER — Ambulatory Visit (INDEPENDENT_AMBULATORY_CARE_PROVIDER_SITE_OTHER): Payer: Medicare Other | Admitting: Cardiovascular Disease

## 2019-03-28 ENCOUNTER — Telehealth: Payer: Self-pay

## 2019-03-28 VITALS — BP 122/86 | HR 95 | Ht <= 58 in | Wt 162.0 lb

## 2019-03-28 DIAGNOSIS — I483 Typical atrial flutter: Secondary | ICD-10-CM

## 2019-03-28 DIAGNOSIS — Z5181 Encounter for therapeutic drug level monitoring: Secondary | ICD-10-CM | POA: Diagnosis not present

## 2019-03-28 DIAGNOSIS — I10 Essential (primary) hypertension: Secondary | ICD-10-CM

## 2019-03-28 DIAGNOSIS — I5042 Chronic combined systolic (congestive) and diastolic (congestive) heart failure: Secondary | ICD-10-CM

## 2019-03-28 DIAGNOSIS — E7800 Pure hypercholesterolemia, unspecified: Secondary | ICD-10-CM

## 2019-03-28 DIAGNOSIS — I739 Peripheral vascular disease, unspecified: Secondary | ICD-10-CM

## 2019-03-28 LAB — BASIC METABOLIC PANEL
BUN/Creatinine Ratio: 14 (ref 10–24)
BUN: 18 mg/dL (ref 10–36)
CO2: 27 mmol/L (ref 20–29)
Calcium: 9.1 mg/dL (ref 8.6–10.2)
Chloride: 98 mmol/L (ref 96–106)
Creatinine, Ser: 1.29 mg/dL — ABNORMAL HIGH (ref 0.76–1.27)
GFR calc Af Amer: 56 mL/min/{1.73_m2} — ABNORMAL LOW (ref >59)
GFR calc non Af Amer: 48 mL/min/{1.73_m2} — ABNORMAL LOW (ref >59)
Glucose: 88 mg/dL (ref 65–99)
Potassium: 4.3 mmol/L (ref 3.5–5.2)
Sodium: 140 mmol/L (ref 134–144)

## 2019-03-28 NOTE — Progress Notes (Signed)
Cardiology Office Note   Date:  03/28/2019   ID:  [name] [name], DOB [birth year], MRN [id number]  PCP:  [name], MD  Cardiologist:   Skeet Latch, MD   No chief complaint on file.     History of Present Illness: [name] [name] is a 85 y.o. male with chronic systolic and diastolic heart failure, PAF, PAD s/p bilateral AKA, hypertension, hyperlipidemia, CKD III, dementia, and prior DVT who presents for follow-up.  He was initially seen in the hospital 06/2018 for new onset atrial flutter and new onset heart failure.  Echo at that time revealed LVEF 15 to 20% with diffuse hypokinesis and moderate LVH.  There is also severely reduced right ventricular function.  He converted to sinus rhythm on diltiazem.  His cardiomyopathy was thought to be tachycardia induced.  He was started on metoprolol and losartan.  No ischemia evaluation occurred at that time.  Subsequent echo 11/2018 revealed an improvement in his LVEF to 40 to 45%.  He was admitted to the hospital 02/2019 with acute on chronic heart failure and scrotal edema.  He was diuresed with IV Lasix and discharge weight was 77.6 kg.  His home oral Lasix was increased to 40 mg in the morning and 20 in the afternoon.  His blood pressure was low so his home metoprolol and losartan were held.  Since being discharged 3 days ago [name] [name] has felt well.  He reports that his breathing is good and he no longer has edema.  He denies orthopnea or PND.  His only complaint is feeling weak.  Physical therapy is going to start seeing him this afternoon.  He lives with his daughter and feels like he has a good discharge plan.  He does not check his blood pressure at home.  He has been trying to limit salt intake.   Past Medical History:  Diagnosis Date   Chronic kidney disease    RENAL INSUFICCIENCY   Diabetes mellitus without complication ([place])    DVT (deep venous thrombosis) ([place])    Gout    Hypertension    Lymphadenitis 08/10/2012   Psoriasis       Past Surgical History:  Procedure Laterality Date   AMPUTATION Bilateral 10/08/2017   Procedure: Bilateral  ABOVE KNEE amputations;  Surgeon: Conrad Newberg, MD;  Location: [facility] OR;  Service: Vascular;  Laterality: Bilateral;     Current Outpatient Medications  Medication Sig Dispense Refill   metoprolol succinate (TOPROL-XL) 100 MG 24 hr tablet Take 100 mg by mouth daily. Take with or immediately following a meal.     apixaban (ELIQUIS) 5 MG TABS tablet Take 1 tablet (5 mg total) by mouth 2 (two) times daily. 60 tablet 5   fluocinolone (SYNALAR) 0.025 % cream Apply topically 2 (two) times daily. To the scalp and sparingly to the face 120 g prn   furosemide (LASIX) 40 MG tablet Take 0.5 tablets (20 mg total) by mouth daily. Take 2 pills (40mg ) by mouth in the morning and 1 pill (20mg ) by mouth in the evening. 30 tablet 5   gabapentin (NEURONTIN) 100 MG capsule PATIENT TAKES 2 CAPSULES IN THE MORNING AND 2 CAPSULES EVERY EVENING AND 3 CAPSULES AT BEDTIME TIME (Patient taking differently: Take 100 mg by mouth See admin instructions. PATIENT TAKES 2 CAPSULES IN THE MORNING AND 2 CAPSULES EVERY EVENING AND 3 CAPSULES AT BEDTIME TIME) 210 capsule 4   hydrocortisone cream 1 % Apply topically 2 (two) times daily. 30 g 1  potassium chloride 20 MEQ TBCR Take 20 mEq by mouth daily. 30 tablet 0   No current facility-administered medications for this visit.     Allergies:   Patient has no known allergies.    Social History:  The patient  reports that he has quit smoking. His smoking use included pipe. He quit after 40.00 years of use. He has never used smokeless tobacco. He reports that he does not drink alcohol or use drugs.   Family History:  The patient's family history includes Hypertension in his father and mother; Stroke in his mother.    ROS:  Please see the history of present illness.   Otherwise, review of systems are positive for none.   All other systems are reviewed and  negative.    PHYSICAL EXAM: VS:  BP 122/86    Pulse 95    Ht 4' (1.219 m)    Wt 162 lb (73.5 kg)    SpO2 98%    BMI 49.44 kg/m  , BMI Body mass index is 49.44 kg/m. GENERAL:  Chronically ill-appearing.  No acute distress.  In wheelchair. HEENT:  Pupils equal round and reactive, fundi not visualized, oral mucosa unremarkable NECK:  No jugular venous distention, waveform within normal limits, carotid upstroke brisk and symmetric, no bruits LUNGS:  Clear to auscultation bilaterally HEART:  RRR.  PMI not displaced or sustained,S1 and S2 within normal limits, no S3, no S4, no clicks, no rubs, II/VI systolic murmurs ABD:  Flat, positive bowel sounds normal in frequency in pitch, no bruits, no rebound, no guarding, no midline pulsatile mass, no hepatomegaly, no splenomegaly EXT:  2 plus pulses throughout, no edema.  Bilateral AKA SKIN:  Diffuse psoriasis NEURO:  Cranial nerves II through XII grossly intact, motor grossly intact throughout PSYCH:  Cognitively intact, oriented to person place and time   EKG:  EKG is not ordered today.  2D echo 11/2018 IMPRESSIONS   1. The left ventricle has mild-moderately reduced systolic function, with an ejection fraction of 40-45%. The cavity size was normal. There is severe asymmetric left ventricular hypertrophy. Left ventricular diastolic Doppler parameters are consistent  with pseudonormalization. 2. The right ventricle has moderately reduced systolic function. The cavity was normal. There is no increase in right ventricular wall thickness. Right ventricular systolic pressure is moderately elevated. 3. Left atrial size was moderately dilated. 4. Mitral valve regurgitation is mild to moderate by color flow Doppler. No evidence of mitral valve stenosis. 5. The aortic valve is abnormal. Mild thickening of the aortic valve. Mild calcification of the aortic valve. No stenosis of the aortic valve. 6. The aorta is normal unless otherwise noted. 7. The  aortic root and ascending aorta are normal in size and structure. 8. There is mild dilatation of the ascending aorta. 9. Pulmonary hypertension is moderate. 10. The atrial septum is grossly normal. 11. The average left ventricular global longitudinal strain is -7.8 %.   Echo 06/2018: IMPRESSIONS    1. The left ventricle has a visually estimated ejection fraction of approximately 15-20%. The cavity size was normal. There is moderately increased left ventricular wall thickness. There is diffuse hypokinesis. Left ventricular diastolic Doppler  parameters are indeterminate in the setting of atrial fibrillation.  2. The right ventricle has severely reduced systolic function. The cavity was moderately enlarged. There is no increase in right ventricular wall thickness. Right ventricular systolic pressure normal with an estimated pressure of 32.9 mmHg.  3. Left atrial size was severely dilated.  4. The pericardial  effusion is posterior to the left ventricle.  5. Trivial pericardial effusion is present.  6. Left pleural effusion is preseent.  7. The aortic valve is tricuspid. Mild calcification of the aortic valve. Mild to moderate aortic annular calcification noted.  8. The mitral valve is abnormal. Mild thickening of the mitral valve leaflet. There is mild mitral annular calcification present. Mitral valve regurgitation is mild to moderate by color flow Doppler.  9. The aortic root is normal in size and structure. 10. The inferior vena cava was dilated in size with >50% respiratory variability.   Recent Labs: 07/15/2018: TSH 3.091 10/08/2018: ALT 10 03/18/2019: Hemoglobin 13.8; Platelets 162 03/20/2019: Magnesium 1.9 03/22/2019: B Natriuretic Peptide 1,887.8 03/25/2019: BUN 20; Creatinine, Ser 1.31; Potassium 4.1; Sodium 137    Lipid Panel    Component Value Date/Time   CHOL 197 10/08/2018 1148   TRIG 103 10/08/2018 1148   HDL 58 10/08/2018 1148   CHOLHDL 3.4 10/08/2018 1148    CHOLHDL 4.4 Ratio 05/10/2008 2325   VLDL 35 05/10/2008 2325   LDLCALC 118 (H) 10/08/2018 1148      Wt Readings from Last 3 Encounters:  03/28/19 162 lb (73.5 kg)  03/26/19 162 lb 14.7 oz (73.9 kg)  11/25/18 169 lb 5 oz (76.8 kg)      ASSESSMENT AND PLAN:  # Chronic systolic and diastolic heart failure:  LVEF improved from 10 to 15% up to 40 to 45%.  He is doing well after his recent discharge for acute on chronic heart failure.  His home losartan and metoprolol have been held due to low blood pressure.  His blood pressure is much better today.  His heart failure is thought to be tachycardia mediated.  Therefore we will start metoprolol today.  He is scheduled to see his primary care provider next month.  If his blood pressure remains high enough losartan can be resumed.  Of note, he never had an ischemia evaluation.  However given his lack of symptoms, age, and tenuous renal status this is probably okay.  Low threshold for stress testing.  Check BMP today.  # PAF:  He was initially seen for typical atrial flutter.  He continues to maintain sinus rhythm.  Resume metoprolol as above.  Continue Eliquis.  # PAD s/p AKA: # Hyperlipidemia:  LDL was 118 on 09/2018.  Will discuss statins at follow-up.  He is on Eliquis so no aspirin.  # Essential hypertension:  Resume metoprolol and consider adding back losartan as above.    Current medicines are reviewed at length with the patient today.  The patient does not have concerns regarding medicines.  The following changes have been made:  Resume metoprolol  Labs/ tests ordered today include:   Orders Placed This Encounter  Procedures   Basic metabolic panel     Disposition:   FU with Patriciaann Rabanal C. Oval Linsey, MD, [facility] in 2 months.      Signed, Jaxn Chiquito C. Oval Linsey, MD, [facility]  03/28/2019 1:11 PM    [place]

## 2019-03-28 NOTE — Patient Instructions (Addendum)
Medication Instructions:  RESTART METOPROLOL DAILY  *If you need a refill on your cardiac medications before your next appointment, please call your pharmacy*  Lab Work: BMET TODAY   If you have labs (blood work) drawn today and your tests are completely normal, you will receive your results only by: Marland Kitchen MyChart Message (if you have MyChart) OR . A paper copy in the mail If you have any lab test that is abnormal or we need to change your treatment, we will call you to review the results.  Testing/Procedures: NONE  Follow-Up: At [facility], you and your health needs are our priority.  As part of our continuing mission to provide you with exceptional heart care, we have created designated Provider Care Teams.  These Care Teams include your primary Cardiologist (physician) and Advanced Practice Providers (APPs -  Physician Assistants and Nurse Practitioners) who all work together to provide you with the care you need, when you need it.  Your next appointment:   2 month(s)  The format for your next appointment:   In Person  Provider:   You may see Skeet Latch, MD or one of the following Advanced Practice Providers on your designated Care Team:    Kerin Ransom, PA-C  [place], [place]  Coletta Memos, [place]

## 2019-03-28 NOTE — Telephone Encounter (Signed)
Transition Care Management Follow-up Telephone Call Date of discharge and from where: 03/26/2019,  [facility]    Attempted to contact the patient # [phone], message left with call back requested to this CM # [phone]

## 2019-03-29 ENCOUNTER — Other Ambulatory Visit: Payer: Self-pay | Admitting: Cardiovascular Disease

## 2019-03-29 ENCOUNTER — Telehealth: Payer: Self-pay

## 2019-03-29 NOTE — Telephone Encounter (Signed)
Transition Care Management Follow-up Telephone Call Date of discharge and from where: 03/26/2019, [facility].  Call placed to patient # [phone], message left with call back requested to this CM

## 2019-03-29 NOTE — Telephone Encounter (Signed)
°*  STAT* If patient is at the pharmacy, call can be transferred to refill team.   1. Which medications need to be refilled? (please list name of each medication and dose if known)   potassium chloride 20 MEQ TBCR(Expired)   2. Which pharmacy/location (including street and city if local pharmacy) is medication to be sent to? [facility] [phone] - [place], [address]  3. Do they need a 30 day or 90 day supply? 90 day   Patient is currently out of medication.

## 2019-03-29 NOTE — Telephone Encounter (Signed)
Transition Care Management Follow-up Telephone Call  Date of discharge and from where: 03/26/2019, [facility]  How have you been since you were released from the hospital? He said that he is " doing good."   Any questions or concerns? No questions/concerns at this time   Items Reviewed:  Did the pt receive and understand the discharge instructions provided? yes  Medications obtained and verified? He said that he has all medications and always takes them as ordered. He said that they were reviewed yesterday when he was at the cardiologist office.  He has no questions at this time. He noted that his daughter helps order the meds as well as set them up for him.   Any new allergies since your discharge?  None reported   Do you have support at home? Lives with daughter, Lynelle Smoke  Other (ie: DME, [place], etc) he has a power chair for mobility  He said that he had a nurse come out to see him, he was not sure of her name or the agency she is with  He does not have his leg prostheses. He said that he first needs to get stronger with PT.   Functional Questionnaire: (I = Independent and D = Dependent) ADL's:He said that he tries to do as much for himself that he can. daughter provides necessary assistance    Follow up appointments reviewed:    PCP Hospital f/u appt confirmed? Appointment scheduled for 04/13/2019 at 1430.  He did not want a morning appt and this was the earliest appt available with his PCP.  Informed him that his PCP will determine if it is in person visit or televisit.   [facility] f/u appt confirmed? He saw the cardiologist yesterday  Are transportation arrangements needed? Uses [facility]  If their condition worsens, is the pt aware to call  their PCP or go to the ED?yes  Was the patient provided with contact information for the PCP's office or ED? He has the phone number  Was the pt encouraged to call back with questions or concerns? yes

## 2019-03-30 MED ORDER — POTASSIUM CHLORIDE ER 20 MEQ PO TBCR: 20.0000 meq | EXTENDED_RELEASE_TABLET | Freq: Every day | ORAL | 0 refills | Status: DC

## 2019-03-30 NOTE — Telephone Encounter (Signed)
Requested Prescriptions   Signed Prescriptions Disp Refills  . Potassium Chloride ER 20 MEQ TBCR 90 tablet 0    Sig: Take 20 mEq by mouth daily.    Authorizing Provider: Skeet Latch    Ordering User: Raelene Bott, BRANDY L

## 2019-03-31 ENCOUNTER — Telehealth: Payer: Self-pay

## 2019-03-31 NOTE — Telephone Encounter (Signed)
Call placed to [facility], spoke to [facility] who confirmed that the patient has been on service with them since 03/26/2019.  He was last seen today by PT

## 2019-04-13 ENCOUNTER — Ambulatory Visit: Payer: Medicare Other | Attending: Family Medicine | Admitting: Family Medicine

## 2019-04-13 ENCOUNTER — Other Ambulatory Visit: Payer: Self-pay

## 2019-04-14 ENCOUNTER — Other Ambulatory Visit: Payer: Self-pay | Admitting: Family Medicine

## 2019-04-14 DIAGNOSIS — I4891 Unspecified atrial fibrillation: Secondary | ICD-10-CM

## 2019-04-14 DIAGNOSIS — I4892 Unspecified atrial flutter: Secondary | ICD-10-CM

## 2019-04-14 NOTE — Telephone Encounter (Signed)
Patient missed tele visit on 12/16. Please fill if appropriate

## 2019-05-16 ENCOUNTER — Ambulatory Visit: Payer: Medicare Other | Admitting: Family Medicine

## 2019-05-27 ENCOUNTER — Ambulatory Visit: Payer: Medicare Other | Attending: Family Medicine | Admitting: Family Medicine

## 2019-05-27 ENCOUNTER — Encounter: Payer: Self-pay | Admitting: Family Medicine

## 2019-05-27 ENCOUNTER — Other Ambulatory Visit: Payer: Self-pay

## 2019-05-27 VITALS — BP 125/83 | HR 54

## 2019-05-27 DIAGNOSIS — L409 Psoriasis, unspecified: Secondary | ICD-10-CM

## 2019-05-27 DIAGNOSIS — Z7901 Long term (current) use of anticoagulants: Secondary | ICD-10-CM | POA: Diagnosis not present

## 2019-05-27 DIAGNOSIS — N183 Chronic kidney disease, stage 3 unspecified: Secondary | ICD-10-CM

## 2019-05-27 DIAGNOSIS — I5032 Chronic diastolic (congestive) heart failure: Secondary | ICD-10-CM

## 2019-05-27 DIAGNOSIS — Z89611 Acquired absence of right leg above knee: Secondary | ICD-10-CM

## 2019-05-27 DIAGNOSIS — E1122 Type 2 diabetes mellitus with diabetic chronic kidney disease: Secondary | ICD-10-CM | POA: Diagnosis not present

## 2019-05-27 DIAGNOSIS — I4891 Unspecified atrial fibrillation: Secondary | ICD-10-CM

## 2019-05-27 DIAGNOSIS — Z79899 Other long term (current) drug therapy: Secondary | ICD-10-CM | POA: Diagnosis not present

## 2019-05-27 DIAGNOSIS — I739 Peripheral vascular disease, unspecified: Secondary | ICD-10-CM | POA: Diagnosis not present

## 2019-05-27 DIAGNOSIS — Z8249 Family history of ischemic heart disease and other diseases of the circulatory system: Secondary | ICD-10-CM | POA: Insufficient documentation

## 2019-05-27 DIAGNOSIS — D649 Anemia, unspecified: Secondary | ICD-10-CM | POA: Diagnosis not present

## 2019-05-27 DIAGNOSIS — N2889 Other specified disorders of kidney and ureter: Secondary | ICD-10-CM

## 2019-05-27 DIAGNOSIS — I4892 Unspecified atrial flutter: Secondary | ICD-10-CM

## 2019-05-27 DIAGNOSIS — I130 Hypertensive heart and chronic kidney disease with heart failure and stage 1 through stage 4 chronic kidney disease, or unspecified chronic kidney disease: Secondary | ICD-10-CM | POA: Diagnosis not present

## 2019-05-27 DIAGNOSIS — Z794 Long term (current) use of insulin: Secondary | ICD-10-CM | POA: Diagnosis not present

## 2019-05-27 DIAGNOSIS — E1159 Type 2 diabetes mellitus with other circulatory complications: Secondary | ICD-10-CM | POA: Diagnosis not present

## 2019-05-27 DIAGNOSIS — E1151 Type 2 diabetes mellitus with diabetic peripheral angiopathy without gangrene: Secondary | ICD-10-CM | POA: Diagnosis not present

## 2019-05-27 DIAGNOSIS — Z87891 Personal history of nicotine dependence: Secondary | ICD-10-CM | POA: Insufficient documentation

## 2019-05-27 DIAGNOSIS — R350 Frequency of micturition: Secondary | ICD-10-CM | POA: Diagnosis not present

## 2019-05-27 DIAGNOSIS — Z86718 Personal history of other venous thrombosis and embolism: Secondary | ICD-10-CM | POA: Insufficient documentation

## 2019-05-27 DIAGNOSIS — Z89612 Acquired absence of left leg above knee: Secondary | ICD-10-CM

## 2019-05-27 LAB — POCT GLYCOSYLATED HEMOGLOBIN (HGB A1C): HbA1c, POC (controlled diabetic range): 6.1 % (ref 0.0–7.0)

## 2019-05-27 LAB — GLUCOSE, POCT (MANUAL RESULT ENTRY): POC Glucose: 112 mg/dL — AB (ref 70–99)

## 2019-05-27 MED ORDER — FLUOCINOLONE ACETONIDE 0.025 % EX CREA: TOPICAL_CREAM | Freq: Two times a day (BID) | CUTANEOUS | 99 refills | Status: DC

## 2019-05-27 MED ORDER — SILVER SULFADIAZINE 1 % EX CREA: 1.0000 "application " | TOPICAL_CREAM | Freq: Every day | CUTANEOUS | 6 refills | Status: AC

## 2019-05-27 NOTE — Progress Notes (Signed)
Established Patient Office Visit  Subjective:  Patient ID: [name] [name], male    DOB: [birth year]  Age: 86 y.o. MRN: [id number]  CC:  Chief Complaint  Patient presents with  . Diabetes    HPI [name] [name], 86 year old African-American male, who is seen in follow-up of chronic medical issues including congestive heart failure, peripheral arterial disease status post bilateral above-the-knee amputations, psoriasis, chronic kidney disease,  Atrial fibrillation, hypertension and chronic anticoagulation.         He reports that he feels well at this time.  He was able to see a dermatologist and received medications to help with his psoriasis.  He reports however that he believes he has misplaced one of his creams.  He has been taking his fluid medication daily but did not take prior to today's visit but will take the next dose when he gets home.  He has not had any recent issues with shortness of breath or fluid retention.  He denies any chest pain or palpitations.  He reports that he has had issues with itching on the area of his right stop as he states that this area prior to amputation was an area where he would have recurrent issues with psoriasis.  He has not had any issues with skin breakdown.         He reports that he has somewhat frustrated because he was told by someone who makes prosthesis that he would be eligible to have prosthetic limbs after he underwent physical therapy and patient states that now that he is completed physical therapy, he has been told that he is not eligible to receive artificial limbs.  He is currently using his powered wheelchair but would like to be able to have prosthetic legs as he believes that this would give him more mobility in areas where he has difficulty using his powered wheelchair due to space limitations.           He continues to try and follow a healthy diet to help control his diabetes.  He denies any issues with increased thirst or blurred vision related  to his diabetes.  He feels that his urinary frequency is related to his use of Lasix.  He denies any dysuria.  He continues to take his metoprolol and denies any issues with sensation of increased heart rate or palpitations.  He also continues use of Eliquis.  He has had no unusual bruising or bleeding.  He has had no nosebleeds, no blood in the stool, no blood in the urine and no bleeding with dental care.  He has not taking/using any nonsteroidal anti-inflammatories as he knows that he is not supposed to due to being on blood thinning medicine as well as having a history of chronic kidney disease.  Past Medical History:  Diagnosis Date  . Chronic kidney disease    RENAL INSUFICCIENCY  . Diabetes mellitus without complication ([place])   . DVT (deep venous thrombosis) ([place])   . Gout   . Hypertension   . Lymphadenitis 08/10/2012  . Psoriasis     Past Surgical History:  Procedure Laterality Date  . AMPUTATION Bilateral 10/08/2017   Procedure: Bilateral  ABOVE KNEE amputations;  Surgeon: Conrad The Meadows, MD;  Location: [facility] OR;  Service: Vascular;  Laterality: Bilateral;    Family History  Problem Relation Age of Onset  . Hypertension Mother   . Stroke Mother   . Hypertension Father     Social History   Socioeconomic History  .  Marital status: Widowed    Spouse name: Not on file  . Number of children: Not on file  . Years of education: Not on file  . Highest education level: Not on file  Occupational History  . Occupation: Retired  Tobacco Use  . Smoking status: Former Smoker    Years: 40.00    Types: Pipe  . Smokeless tobacco: Never Used  Substance and Sexual Activity  . Alcohol use: No  . Drug use: No  . Sexual activity: Not on file  Other Topics Concern  . Not on file  Social History Narrative   He has a room in his daughter's house.   Social Determinants of Health   Financial Resource Strain:   . Difficulty of Paying Living Expenses: Not on file  Food Insecurity:   .  Worried About Charity fundraiser in the Last Year: Not on file  . Ran Out of Food in the Last Year: Not on file  Transportation Needs:   . Lack of Transportation (Medical): Not on file  . Lack of Transportation (Non-Medical): Not on file  Physical Activity:   . Days of Exercise per Week: Not on file  . Minutes of Exercise per Session: Not on file  Stress:   . Feeling of Stress : Not on file  Social Connections:   . Frequency of Communication with Friends and Family: Not on file  . Frequency of Social Gatherings with Friends and Family: Not on file  . Attends Religious Services: Not on file  . Active Member of Clubs or Organizations: Not on file  . Attends Archivist Meetings: Not on file  . Marital Status: Not on file  Intimate Partner Violence:   . Fear of Current or Ex-Partner: Not on file  . Emotionally Abused: Not on file  . Physically Abused: Not on file  . Sexually Abused: Not on file    Outpatient Medications Prior to Visit  Medication Sig Dispense Refill  . ELIQUIS 5 MG TABS tablet TAKE 1 TABLET BY MOUTH TWICE DAILY 60 tablet 2  . fluocinolone (SYNALAR) 0.025 % cream Apply topically 2 (two) times daily. To the scalp and sparingly to the face 120 g prn  . furosemide (LASIX) 40 MG tablet Take 0.5 tablets (20 mg total) by mouth daily. Take 2 pills (40mg ) by mouth in the morning and 1 pill (20mg ) by mouth in the evening. 30 tablet 5  . gabapentin (NEURONTIN) 100 MG capsule PATIENT TAKES 2 CAPSULES IN THE MORNING AND 2 CAPSULES EVERY EVENING AND 3 CAPSULES AT BEDTIME TIME (Patient taking differently: Take 100 mg by mouth See admin instructions. PATIENT TAKES 2 CAPSULES IN THE MORNING AND 2 CAPSULES EVERY EVENING AND 3 CAPSULES AT BEDTIME TIME) 210 capsule 4  . hydrocortisone cream 1 % Apply topically 2 (two) times daily. 30 g 1  . metoprolol succinate (TOPROL-XL) 100 MG 24 hr tablet Take 100 mg by mouth daily. Take with or immediately following a meal.    . Potassium  Chloride ER 20 MEQ TBCR Take 20 mEq by mouth daily. 90 tablet 0   No facility-administered medications prior to visit.    No Known Allergies  ROS Review of Systems  Constitutional: Negative for chills and fever.  HENT: Negative for sore throat and trouble swallowing.   Respiratory: Negative for cough and shortness of breath.   Cardiovascular: Negative for chest pain, palpitations and leg swelling.  Gastrointestinal: Negative for abdominal pain, blood in stool, constipation, diarrhea and  nausea.  Endocrine: Negative for polydipsia, polyphagia and polyuria.  Genitourinary: Positive for frequency (do to fluid pill). Negative for dysuria.  Musculoskeletal: Negative for arthralgias and back pain.  Skin: Positive for rash. Negative for wound.  Neurological: Negative for dizziness and headaches.  Hematological: Negative for adenopathy. Does not bruise/bleed easily.      Objective:    Physical Exam  Constitutional: He is oriented to person, place, and time. He appears well-developed and well-nourished.  Well-nourished well-developed larger framed elderly male in no acute distress wearing a mask per office COVID-19 protocol and sitting in a powered wheelchair  Neck: No JVD present. No thyromegaly present.  Cardiovascular: Normal rate.  Irregularly irregular rhythm consistent with atrial fibrillation  Pulmonary/Chest: Effort normal and breath sounds normal.  Abdominal: Soft. There is no abdominal tenderness. There is no rebound and no guarding.  Musculoskeletal:        General: No tenderness or edema.     Cervical back: Normal range of motion and neck supple.  Lymphadenopathy:    He has no cervical adenopathy.  Neurological: He is alert and oriented to person, place, and time.  Skin: Skin is warm. Rash noted.  Patient with thickened psoriatic plaque on the scalp and left side of the face as well as on the elbows.  Areas of plaque or less flaky than at prior visits.  Patient does not  have any acute active breakdown on the sites of his AKA's bilaterally however he does have some thickening and darkened appearance to the skin on the right inferior medial aspect of the right AKA site and patient repeatedly rubs at this area during his visit.  Psychiatric: He has a normal mood and affect. His behavior is normal.  Nursing note and vitals reviewed.   BP 125/83   Pulse (!) 54   SpO2 99%  Wt Readings from Last 3 Encounters:  03/28/19 162 lb (73.5 kg)  03/26/19 162 lb 14.7 oz (73.9 kg)  11/25/18 169 lb 5 oz (76.8 kg)     Health Maintenance Due  Topic Date Due  . OPHTHALMOLOGY EXAM  09/30/1936  . URINE MICROALBUMIN  09/30/1936  . PNA vac Low Risk Adult (2 of 2 - PCV13) 08/12/2013    Lab Results  Component Value Date   TSH 3.091 07/15/2018   Lab Results  Component Value Date   WBC 3.4 (L) 03/18/2019   HGB 13.8 03/18/2019   HCT 42.7 03/18/2019   MCV 102.2 (H) 03/18/2019   PLT 162 03/18/2019   Lab Results  Component Value Date   NA 140 03/28/2019   K 4.3 03/28/2019   CO2 27 03/28/2019   GLUCOSE 88 03/28/2019   BUN 18 03/28/2019   CREATININE 1.29 (H) 03/28/2019   BILITOT 0.7 10/08/2018   ALKPHOS 55 10/08/2018   AST 26 10/08/2018   ALT 10 10/08/2018   PROT 7.3 10/08/2018   ALBUMIN 3.9 10/08/2018   CALCIUM 9.1 03/28/2019   ANIONGAP 10 03/25/2019   Lab Results  Component Value Date   CHOL 197 10/08/2018   Lab Results  Component Value Date   HDL 58 10/08/2018   Lab Results  Component Value Date   LDLCALC 118 (H) 10/08/2018   Lab Results  Component Value Date   TRIG 103 10/08/2018   Lab Results  Component Value Date   CHOLHDL 3.4 10/08/2018   Lab Results  Component Value Date   HGBA1C 6.1 05/27/2019      Assessment & Plan:  1. Type 2  diabetes mellitus with other circulatory complication, without long-term current use of insulin ([place]) Patient with diet-controlled type 2 diabetes which has generally been well controlled.  Hemoglobin A1c  in June of this year was 5.8.  At today's visit, patient with nonfasting glucose of 112.  Hemoglobin A1c 6.1.  He is to continue a healthy diet and avoid concentrated sweets.  He will also have BMP at today's visit. - POCT glucose (manual entry) - POCT glycosylated hemoglobin (Hb 123XX123) - Basic Metabolic Panel  2. Chronic diastolic CHF (congestive heart failure) ([facility]) His chronic CHF appears to be stable as he does not have any appearance of significant edema and reports no issues with increased shortness of breath.  He was encouraged to remain compliant with his use of Lasix.  And he will have BMP in follow-up of CHF and use of diuretic medication. - Basic Metabolic Panel  3. PAD (peripheral artery disease) ([place]) He has a history of peripheral arterial disease which resulted in bilateral AKA due to chronic pain from critical limb ischemia.  He is currently on long-term anticoagulation with Eliquis as he also has atrial fibrillation.  Due to his age and other chronic medical conditions, it appears that his statin therapy was discontinued at some point.  His most recent lipid panel was done on 10/08/2018 with LDL of 118, triglycerides of 103 and HDL of 58. - Ambulatory referral to Physical Medicine Rehab  4. Psoriasis He believes that he may have misplaced one of his creams for treatment of psoriasis.  He does not believe that he has an upcoming appointment with dermatology.  On review of medications, he believes that is likely the fluocinolone cream that he does not have therefore new prescription provided.  He will also be referred back to dermatology for ongoing treatment. - Ambulatory referral to Dermatology - fluocinolone (SYNALAR) 0.025 % cream; Apply topically 2 (two) times daily. To the scalp and sparingly to the face  Dispense: 120 g; Refill: prn  5. Atrial fibrillation and flutter [facility]) Patient with atrial fibrillation and at today's visit, rate is controlled.  He is to continue the use of  Eliquis and metoprolol.  CBC will be repeated in follow-up of use of anticoagulant medication. - CBC  6. Chronic renal insufficiency, stage 3 (moderate) He has a history of chronic kidney disease with creatinine as high as 1.6 earlier in the year and most recent creatinine on 03/28/2019 was 1.29.  He is aware of the need to avoid nonsteroidal anti-inflammatory drugs.  Continue to control diabetes and blood pressure.  7. S/P AKA (above knee amputation) bilateral ([place]) He is status post bilateral AKA related to chronic pain related to peripheral arterial disease.  He does not have any current skin breakdown but does have an area of skin on the right AKA site with psoriasis and patient continues to rub at the skin area which is caused some mild irritation.  Prescription provided for Silvadene cream to help with healing and avoid infection.  Patient also wants to know if he will be able to use prosthetic limbs as he states that he was told that he would receive prosthetic labs after physical therapy but was later told that this was not the case.  Patient will be referred to physical medicine rehab for further evaluation. - silver sulfADIAZINE (SILVADENE) 1 % cream; Apply 1 application topically daily. Once daily to area of skin irritation on the right stump x 10 days then as needed  Dispense: 50 g; Refill:  6 - Ambulatory referral to Physical Medicine Rehab  8. Encounter for long-term (current) use of medications He will have basic metabolic panel in follow-up of use of diuretic medication for treatment of CHF.  9. Long term current use of anticoagulant therapy Will obtain CBC in follow-up of long-term use of anticoagulant therapy to look for worsening anemia which may require further evaluation. - CBC  10. Normocytic anemia He has had past normocytic anemia and is on blood thinning medication therefore we will recheck CBC at today's visit.  He will be notified if any interventions are needed based on  his lab results. - CBC  An After Visit Summary was printed and given to the patient.  Follow-up: Return in about 3 months (around 08/25/2019) for chronic issues-sooner if needed.    [name], MD

## 2019-05-28 LAB — BASIC METABOLIC PANEL WITH GFR
BUN/Creatinine Ratio: 12 (ref 10–24)
BUN: 18 mg/dL (ref 10–36)
CO2: 27 mmol/L (ref 20–29)
Calcium: 8.9 mg/dL (ref 8.6–10.2)
Chloride: 99 mmol/L (ref 96–106)
Creatinine, Ser: 1.5 mg/dL — ABNORMAL HIGH (ref 0.76–1.27)
GFR calc Af Amer: 47 mL/min/1.73 — ABNORMAL LOW
GFR calc non Af Amer: 40 mL/min/1.73 — ABNORMAL LOW
Glucose: 97 mg/dL (ref 65–99)
Potassium: 4.4 mmol/L (ref 3.5–5.2)
Sodium: 143 mmol/L (ref 134–144)

## 2019-05-28 LAB — CBC
Hematocrit: 43.4 % (ref 37.5–51.0)
Hemoglobin: 13.8 g/dL (ref 13.0–17.7)
MCH: 30.1 pg (ref 26.6–33.0)
MCHC: 31.8 g/dL (ref 31.5–35.7)
MCV: 95 fL (ref 79–97)
Platelets: 240 x10E3/uL (ref 150–450)
RBC: 4.58 x10E6/uL (ref 4.14–5.80)
RDW: 13.4 % (ref 11.6–15.4)
WBC: 3.6 x10E3/uL (ref 3.4–10.8)

## 2019-06-01 ENCOUNTER — Ambulatory Visit: Payer: Medicare Other | Admitting: Cardiovascular Disease

## 2019-06-27 ENCOUNTER — Telehealth: Payer: Self-pay

## 2019-06-27 NOTE — Telephone Encounter (Signed)
Quantity limit prior authorization for Gabapentin has been approved thru ins thru 04/27/20

## 2019-07-15 ENCOUNTER — Encounter: Payer: Self-pay | Admitting: Cardiovascular Disease

## 2019-07-20 ENCOUNTER — Ambulatory Visit: Payer: Medicare Other

## 2019-07-25 ENCOUNTER — Other Ambulatory Visit: Payer: Self-pay | Admitting: Family Medicine

## 2019-07-25 DIAGNOSIS — I4891 Unspecified atrial fibrillation: Secondary | ICD-10-CM

## 2019-07-25 NOTE — Telephone Encounter (Signed)
Patient takes Eliquis in the setting of A fib. He is 86 YO and Scr from recent BMP (04/2019) is 1.5. Recommend dose reduction to 2.5 mg BID. Will forward information to PCP to review.

## 2019-08-09 ENCOUNTER — Ambulatory Visit (HOSPITAL_COMMUNITY)
Admission: EM | Admit: 2019-08-09 | Discharge: 2019-08-09 | Disposition: A | Discharge status: Home / Self Care | Payer: Medicare Other | Source: Home / Self Care

## 2019-08-09 ENCOUNTER — Other Ambulatory Visit: Payer: Self-pay

## 2019-08-09 ENCOUNTER — Emergency Department (HOSPITAL_COMMUNITY): Payer: Medicare Other

## 2019-08-09 ENCOUNTER — Inpatient Hospital Stay (HOSPITAL_COMMUNITY)
Admission: EM | Admit: 2019-08-09 | Discharge: 2019-08-12 | DRG: 291 | Disposition: A | Discharge status: Home / Self Care | Payer: Medicare Other | Attending: Internal Medicine | Admitting: Internal Medicine

## 2019-08-09 ENCOUNTER — Encounter (HOSPITAL_COMMUNITY): Payer: Self-pay

## 2019-08-09 DIAGNOSIS — I4892 Unspecified atrial flutter: Secondary | ICD-10-CM | POA: Diagnosis present

## 2019-08-09 DIAGNOSIS — Z7901 Long term (current) use of anticoagulants: Secondary | ICD-10-CM

## 2019-08-09 DIAGNOSIS — Z993 Dependence on wheelchair: Secondary | ICD-10-CM

## 2019-08-09 DIAGNOSIS — I071 Rheumatic tricuspid insufficiency: Secondary | ICD-10-CM | POA: Diagnosis present

## 2019-08-09 DIAGNOSIS — E1151 Type 2 diabetes mellitus with diabetic peripheral angiopathy without gangrene: Secondary | ICD-10-CM | POA: Diagnosis present

## 2019-08-09 DIAGNOSIS — M109 Gout, unspecified: Secondary | ICD-10-CM | POA: Diagnosis present

## 2019-08-09 DIAGNOSIS — Z86718 Personal history of other venous thrombosis and embolism: Secondary | ICD-10-CM

## 2019-08-09 DIAGNOSIS — R778 Other specified abnormalities of plasma proteins: Secondary | ICD-10-CM

## 2019-08-09 DIAGNOSIS — Z8249 Family history of ischemic heart disease and other diseases of the circulatory system: Secondary | ICD-10-CM | POA: Diagnosis not present

## 2019-08-09 DIAGNOSIS — I509 Heart failure, unspecified: Secondary | ICD-10-CM

## 2019-08-09 DIAGNOSIS — I5043 Acute on chronic combined systolic (congestive) and diastolic (congestive) heart failure: Secondary | ICD-10-CM | POA: Diagnosis not present

## 2019-08-09 DIAGNOSIS — I4891 Unspecified atrial fibrillation: Secondary | ICD-10-CM | POA: Diagnosis present

## 2019-08-09 DIAGNOSIS — Z20822 Contact with and (suspected) exposure to covid-19: Secondary | ICD-10-CM | POA: Diagnosis present

## 2019-08-09 DIAGNOSIS — F0390 Unspecified dementia without behavioral disturbance: Secondary | ICD-10-CM | POA: Diagnosis present

## 2019-08-09 DIAGNOSIS — Y92239 Unspecified place in hospital as the place of occurrence of the external cause: Secondary | ICD-10-CM | POA: Diagnosis not present

## 2019-08-09 DIAGNOSIS — R5381 Other malaise: Secondary | ICD-10-CM | POA: Diagnosis present

## 2019-08-09 DIAGNOSIS — I255 Ischemic cardiomyopathy: Secondary | ICD-10-CM | POA: Diagnosis present

## 2019-08-09 DIAGNOSIS — K529 Noninfective gastroenteritis and colitis, unspecified: Secondary | ICD-10-CM

## 2019-08-09 DIAGNOSIS — I130 Hypertensive heart and chronic kidney disease with heart failure and stage 1 through stage 4 chronic kidney disease, or unspecified chronic kidney disease: Principal | ICD-10-CM | POA: Diagnosis present

## 2019-08-09 DIAGNOSIS — Z823 Family history of stroke: Secondary | ICD-10-CM

## 2019-08-09 DIAGNOSIS — Z79899 Other long term (current) drug therapy: Secondary | ICD-10-CM

## 2019-08-09 DIAGNOSIS — I4821 Permanent atrial fibrillation: Secondary | ICD-10-CM | POA: Diagnosis present

## 2019-08-09 DIAGNOSIS — Z87891 Personal history of nicotine dependence: Secondary | ICD-10-CM | POA: Diagnosis not present

## 2019-08-09 DIAGNOSIS — E1122 Type 2 diabetes mellitus with diabetic chronic kidney disease: Secondary | ICD-10-CM | POA: Diagnosis present

## 2019-08-09 DIAGNOSIS — Z89612 Acquired absence of left leg above knee: Secondary | ICD-10-CM | POA: Diagnosis not present

## 2019-08-09 DIAGNOSIS — L409 Psoriasis, unspecified: Secondary | ICD-10-CM | POA: Diagnosis present

## 2019-08-09 DIAGNOSIS — Z89611 Acquired absence of right leg above knee: Secondary | ICD-10-CM | POA: Diagnosis not present

## 2019-08-09 DIAGNOSIS — M792 Neuralgia and neuritis, unspecified: Secondary | ICD-10-CM

## 2019-08-09 DIAGNOSIS — N1832 Chronic kidney disease, stage 3b: Secondary | ICD-10-CM | POA: Diagnosis present

## 2019-08-09 DIAGNOSIS — Z720 Tobacco use: Secondary | ICD-10-CM | POA: Diagnosis present

## 2019-08-09 DIAGNOSIS — I429 Cardiomyopathy, unspecified: Secondary | ICD-10-CM | POA: Diagnosis not present

## 2019-08-09 DIAGNOSIS — N183 Chronic kidney disease, stage 3 unspecified: Secondary | ICD-10-CM | POA: Diagnosis present

## 2019-08-09 DIAGNOSIS — I5031 Acute diastolic (congestive) heart failure: Secondary | ICD-10-CM | POA: Diagnosis not present

## 2019-08-09 DIAGNOSIS — I10 Essential (primary) hypertension: Secondary | ICD-10-CM | POA: Diagnosis present

## 2019-08-09 DIAGNOSIS — T501X5A Adverse effect of loop [high-ceiling] diuretics, initial encounter: Secondary | ICD-10-CM | POA: Diagnosis not present

## 2019-08-09 DIAGNOSIS — I959 Hypotension, unspecified: Secondary | ICD-10-CM | POA: Diagnosis not present

## 2019-08-09 DIAGNOSIS — E119 Type 2 diabetes mellitus without complications: Secondary | ICD-10-CM

## 2019-08-09 LAB — CBC WITH DIFFERENTIAL/PLATELET
Abs Immature Granulocytes: 0.01 10*3/uL (ref 0.00–0.07)
Basophils Absolute: 0 10*3/uL (ref 0.0–0.1)
Basophils Relative: 1 %
Eosinophils Absolute: 0 10*3/uL (ref 0.0–0.5)
Eosinophils Relative: 0 %
HCT: 45.6 % (ref 39.0–52.0)
Hemoglobin: 14.7 g/dL (ref 13.0–17.0)
Immature Granulocytes: 0 %
Lymphocytes Relative: 19 %
Lymphs Abs: 0.6 10*3/uL — ABNORMAL LOW (ref 0.7–4.0)
MCH: 32.3 pg (ref 26.0–34.0)
MCHC: 32.2 g/dL (ref 30.0–36.0)
MCV: 100.2 fL — ABNORMAL HIGH (ref 80.0–100.0)
Monocytes Absolute: 0.4 10*3/uL (ref 0.1–1.0)
Monocytes Relative: 13 %
Neutro Abs: 2 10*3/uL (ref 1.7–7.7)
Neutrophils Relative %: 67 %
Platelets: 193 10*3/uL (ref 150–400)
RBC: 4.55 MIL/uL (ref 4.22–5.81)
RDW: 17.7 % — ABNORMAL HIGH (ref 11.5–15.5)
WBC: 3.1 10*3/uL — ABNORMAL LOW (ref 4.0–10.5)
nRBC: 0 % (ref 0.0–0.2)

## 2019-08-09 LAB — COMPREHENSIVE METABOLIC PANEL
ALT: 11 U/L (ref 0–44)
AST: 24 U/L (ref 15–41)
Albumin: 3 g/dL — ABNORMAL LOW (ref 3.5–5.0)
Alkaline Phosphatase: 53 U/L (ref 38–126)
Anion gap: 15 (ref 5–15)
BUN: 14 mg/dL (ref 8–23)
CO2: 26 mmol/L (ref 22–32)
Calcium: 9.4 mg/dL (ref 8.9–10.3)
Chloride: 99 mmol/L (ref 98–111)
Creatinine, Ser: 1.43 mg/dL — ABNORMAL HIGH (ref 0.61–1.24)
GFR calc Af Amer: 50 mL/min — ABNORMAL LOW (ref >60)
GFR calc non Af Amer: 43 mL/min — ABNORMAL LOW (ref >60)
Glucose, Bld: 149 mg/dL — ABNORMAL HIGH (ref 70–99)
Potassium: 3.6 mmol/L (ref 3.5–5.1)
Sodium: 140 mmol/L (ref 135–145)
Total Bilirubin: 3.1 mg/dL — ABNORMAL HIGH (ref 0.3–1.2)
Total Protein: 7.2 g/dL (ref 6.5–8.1)

## 2019-08-09 LAB — TROPONIN I (HIGH SENSITIVITY)
Troponin I (High Sensitivity): 116 ng/L (ref <18)
Troponin I (High Sensitivity): 115 ng/L (ref <18)

## 2019-08-09 LAB — CBG MONITORING, ED: Glucose-Capillary: 94 mg/dL (ref 70–99)

## 2019-08-09 LAB — BRAIN NATRIURETIC PEPTIDE: B Natriuretic Peptide: 2983.7 pg/mL — ABNORMAL HIGH (ref 0.0–100.0)

## 2019-08-09 MED ORDER — FUROSEMIDE 10 MG/ML IJ SOLN
40.0000 mg | Freq: Once | INTRAMUSCULAR | Status: AC
  Administered 2019-08-09: 40 mg via INTRAVENOUS
  Filled 2019-08-09: qty 4

## 2019-08-09 MED ORDER — ONDANSETRON HCL 4 MG/2ML IJ SOLN: 4.0000 mg | Freq: Four times a day (QID) | INTRAMUSCULAR | Status: DC | PRN

## 2019-08-09 MED ORDER — SODIUM CHLORIDE 0.9 % IV SOLN: 250.0000 mL | INTRAVENOUS | Status: DC | PRN

## 2019-08-09 MED ORDER — INSULIN ASPART 100 UNIT/ML ~~LOC~~ SOLN: 0.0000 [IU] | Freq: Every day | SUBCUTANEOUS | Status: DC

## 2019-08-09 MED ORDER — SODIUM CHLORIDE 0.9% FLUSH
3.0000 mL | Freq: Two times a day (BID) | INTRAVENOUS | Status: DC
  Administered 2019-08-09 – 2019-08-12 (×6): 3 mL via INTRAVENOUS

## 2019-08-09 MED ORDER — FUROSEMIDE 10 MG/ML IJ SOLN
40.0000 mg | Freq: Two times a day (BID) | INTRAMUSCULAR | Status: DC
  Administered 2019-08-10 – 2019-08-12 (×5): 40 mg via INTRAVENOUS
  Filled 2019-08-09 (×5): qty 4

## 2019-08-09 MED ORDER — SODIUM CHLORIDE 0.9% FLUSH: 3.0000 mL | INTRAVENOUS | Status: DC | PRN

## 2019-08-09 MED ORDER — INSULIN ASPART 100 UNIT/ML ~~LOC~~ SOLN: 0.0000 [IU] | Freq: Three times a day (TID) | SUBCUTANEOUS | Status: DC

## 2019-08-09 MED ORDER — APIXABAN 5 MG PO TABS
5.0000 mg | ORAL_TABLET | Freq: Two times a day (BID) | ORAL | Status: DC
  Administered 2019-08-10 – 2019-08-12 (×6): 5 mg via ORAL
  Filled 2019-08-09 (×7): qty 1

## 2019-08-09 MED ORDER — ACETAMINOPHEN 325 MG PO TABS: 650.0000 mg | ORAL_TABLET | ORAL | Status: DC | PRN

## 2019-08-09 NOTE — H&P (Signed)
History and Physical   [name] [name] YIF:[id number] DOB: 01-17-1927 DOA: 08/09/2019  Referring MD/NP/PA: Dr. Johnney Killian  PCP: [name], MD   Outpatient Specialists: Skeet Latch, MD, cardiology  Patient coming from: Home  Chief Complaint: Diarrhea shortness of breath  HPI: [name] [name] is a 86 y.o. male with medical history significant of systolic dysfunction CHF with EF of 45% from 11/20, hypertension, anasarca, diabetes, chronic kidney disease stage III, psoriasis, gout and hypertension who presented to the ER with diarrhea and shortness of breath.  Patient has paroxysmal atrial fibrillation and was also found to be in A. fib.  He reported this diarrhea this been going on for days.  Patient reports the diarrhea is persistent.  Whenever he eats literally passes out.  This been going on for close to 3 months.  He is however has known CHF and was admitted here in January.  He had anasarca back pain.  Patient complained of shortness of breath again and is presumed to have another anasarca.  He is being admitted at this point for management of CHF but also incidental chronic diarrhea.  Denied any fever or chills..  ED Course: Temperature 98.4 blood pressure 136/97 pulse 112 respiratory 50 oxygen sat 77% room air and 98% on 2 L oxygen.  BNP is about 3000 troponin I 1 5.  Creatinine 1.43.  CBC mostly within normal and glucose 149.  COVID-19 screen currently negative.  Chest x-ray showed cardiomegaly with mild edema and bilateral pleural effusions compatible with congestive heart failure.  Patient being admitted to the hospital for treatment.  Review of Systems: As per HPI otherwise 10 point review of systems negative.    Past Medical History:  Diagnosis Date   Chronic kidney disease    RENAL INSUFICCIENCY   Diabetes mellitus without complication ([place])    DVT (deep venous thrombosis) ([place])    Gout    Hypertension    Lymphadenitis 08/10/2012   Psoriasis     Past Surgical History:    Procedure Laterality Date   AMPUTATION Bilateral 10/08/2017   Procedure: Bilateral  ABOVE KNEE amputations;  Surgeon: Conrad Royalton, MD;  Location: [facility] OR;  Service: Vascular;  Laterality: Bilateral;     reports that he has quit smoking. His smoking use included pipe. He quit after 40.00 years of use. He has never used smokeless tobacco. He reports that he does not drink alcohol or use drugs.  No Known Allergies  Family History  Problem Relation Age of Onset   Hypertension Mother    Stroke Mother    Hypertension Father      Prior to Admission medications   Medication Sig Start Date End Date Taking? Authorizing Provider  ELIQUIS 5 MG TABS tablet TAKE 1 TABLET BY MOUTH TWICE DAILY Patient taking differently: Take 5 mg by mouth 2 (two) times daily.  07/25/19  Yes Fulp, Cammie, MD  furosemide (LASIX) 40 MG tablet Take 0.5 tablets (20 mg total) by mouth daily. Take 2 pills (40mg ) by mouth in the morning and 1 pill (20mg ) by mouth in the evening. Patient taking differently: Take 20 mg by mouth 2 (two) times daily.  03/25/19  Yes Agyei, Caprice Kluver, MD  gabapentin (NEURONTIN) 100 MG capsule PATIENT TAKES 2 CAPSULES IN THE MORNING AND 2 CAPSULES EVERY EVENING AND 3 CAPSULES AT BEDTIME TIME Patient taking differently: Take 100 mg by mouth See admin instructions. PATIENT TAKES 2 CAPSULES IN THE MORNING AND 2 CAPSULES EVERY EVENING AND 3 CAPSULES AT BEDTIME TIME 02/11/19  Yes Fulp, Cammie, MD  fluocinolone (SYNALAR) 0.025 % cream Apply topically 2 (two) times daily. To the scalp and sparingly to the face Patient taking differently: Apply 1 application topically 2 (two) times daily. To the scalp and sparingly to the face 05/27/19   Fulp, Cammie, MD  hydrocortisone cream 1 % Apply topically 2 (two) times daily. 11/25/18   Shelly Coss, MD  metoprolol succinate (TOPROL-XL) 100 MG 24 hr tablet Take 100 mg by mouth daily. Take with or immediately following a meal.    [provider]  Potassium  Chloride ER 20 MEQ TBCR Take 20 mEq by mouth daily. 03/30/19 04/29/19  Skeet Latch, MD  silver sulfADIAZINE (SILVADENE) 1 % cream Apply 1 application topically daily. Once daily to area of skin irritation on the right stump x 10 days then as needed Patient taking differently: Apply 1 application topically See admin instructions. Once daily to area of skin irritation on the right stump x 10 days then as needed 05/27/19   Fulp, Cammie, MD  triamcinolone ointment (KENALOG) 0.1 % Apply 1 application topically 2 (two) times daily as needed (skin irritation.).  07/27/19   [provider]    Physical Exam: Vitals:   08/09/19 1439 08/09/19 1715 08/09/19 1730 08/09/19 1745  BP: 115/70 106/83 106/84 111/88  Pulse: (!) 110 99 87 86  Resp: 20 (!) 23 16 18   Temp: 98 F (36.7 C)     TempSrc: Oral     SpO2: 100% 98% 96% 99%      Constitutional: Obese, anasarca, in mild distress Vitals:   08/09/19 1439 08/09/19 1715 08/09/19 1730 08/09/19 1745  BP: 115/70 106/83 106/84 111/88  Pulse: (!) 110 99 87 86  Resp: 20 (!) 23 16 18   Temp: 98 F (36.7 C)     TempSrc: Oral     SpO2: 100% 98% 96% 99%   Eyes: PERRL, lids and conjunctivae normal ENMT: Mucous membranes are moist. Posterior pharynx clear of any exudate or lesions.Normal dentition.  Neck: normal, supple, no masses, no thyromegaly Respiratory: Decreased air entry bilaterally with bilateral crackles. Normal respiratory effort. No accessory muscle use.  Cardiovascular: Irregularly irregular rate and rhythm, no murmurs / rubs / gallops. No extremity edema. 2+ pedal pulses. No carotid bruits.  Abdomen: no tenderness, no masses palpated. No hepatosplenomegaly. Bowel sounds positive.  Musculoskeletal: no clubbing / cyanosis.  Status post bilateral BKA:  Skin: no rashes, lesions, ulcers. No induration Neurologic: CN 2-12 grossly intact. Sensation intact, DTR normal. Strength 5/5 in all 4.  Psychiatric: Normal judgment and insight. Alert and  oriented x 3. Normal mood.     Labs on Admission: I have personally reviewed following labs and imaging studies  CBC: Recent Labs  Lab 08/09/19 1530  WBC 3.1*  NEUTROABS 2.0  HGB 14.7  HCT 45.6  MCV 100.2*  PLT 761   Basic Metabolic Panel: Recent Labs  Lab 08/09/19 1530  NA 140  K 3.6  CL 99  CO2 26  GLUCOSE 149*  BUN 14  CREATININE 1.43*  CALCIUM 9.4   GFR: CrCl cannot be calculated (Unknown ideal weight.). Liver Function Tests: Recent Labs  Lab 08/09/19 1530  AST 24  ALT 11  ALKPHOS 53  BILITOT 3.1*  PROT 7.2  ALBUMIN 3.0*   No results for input(s): LIPASE, AMYLASE in the last 168 hours. No results for input(s): AMMONIA in the last 168 hours. Coagulation Profile: No results for input(s): INR, PROTIME in the last 168 hours. Cardiac Enzymes: No  results for input(s): CKTOTAL, CKMB, CKMBINDEX, TROPONINI in the last 168 hours. BNP (last 3 results) No results for input(s): PROBNP in the last 8760 hours. HbA1C: No results for input(s): HGBA1C in the last 72 hours. CBG: No results for input(s): GLUCAP in the last 168 hours. Lipid Profile: No results for input(s): CHOL, HDL, LDLCALC, TRIG, CHOLHDL, LDLDIRECT in the last 72 hours. Thyroid Function Tests: No results for input(s): TSH, T4TOTAL, FREET4, T3FREE, THYROIDAB in the last 72 hours. Anemia Panel: No results for input(s): VITAMINB12, FOLATE, FERRITIN, TIBC, IRON, RETICCTPCT in the last 72 hours. Urine analysis:    Component Value Date/Time   COLORURINE YELLOW 03/18/2019 1834   APPEARANCEUR HAZY (A) 03/18/2019 1834   LABSPEC 1.006 03/18/2019 1834   PHURINE 6.0 03/18/2019 1834   GLUCOSEU NEGATIVE 03/18/2019 1834   HGBUR MODERATE (A) 03/18/2019 1834   BILIRUBINUR NEGATIVE 03/18/2019 [place] 03/18/2019 1834   PROTEINUR NEGATIVE 03/18/2019 1834   UROBILINOGEN 0.2 11/16/2012 0345   NITRITE NEGATIVE 03/18/2019 1834   LEUKOCYTESUR LARGE (A) 03/18/2019 1834   Sepsis  Labs: @LABRCNTIP (procalcitonin:4,lacticidven:4) )No results found for this or any previous visit (from the past 240 hour(s)).   Radiological Exams on Admission: DG Chest 2 View  Result Date: 08/09/2019 CLINICAL DATA:  Shortness of breath. EXAM: CHEST - 2 VIEW COMPARISON:  Two-view chest x-ray 03/18/2019 FINDINGS: Heart is enlarged. Mild edema is present. Bilateral pleural effusions and associated dependent airspace disease is evident. Upper lung fields are clear. Degenerative changes are again noted at both shoulders. IMPRESSION: 1. Cardiomegaly with mild edema and bilateral pleural effusions compatible with congestive heart failure. 2. Bibasilar airspace disease likely reflects atelectasis. Electronically Signed   By: San Morelle M.D.   On: 08/09/2019 15:39    EKG: Independently reviewed.  EKG shows atrial fibrillation with a rate of 92, prolonged QT interval.  Normal voltage.  No significant finding based on ST changes.  Assessment/Plan Principal Problem:   Acute on chronic combined systolic and diastolic CHF (congestive heart failure) (HCC) Active Problems:   Tobacco abuse   Diabetes mellitus (HCC)   Chronic renal insufficiency, stage 3 (moderate) (HCC)   HTN (hypertension), benign   S/P AKA (above knee amputation) bilateral (HCC)   Acute on chronic heart failure (HCC)   Atrial fibrillation and flutter (HCC)   Troponin level elevated     #1 acute on chronic CHF exacerbation: Patient has evidence of fluid overload.  We will continue his seizure medications.  IV Lasix.  Mobilize patient with fluids.  Despite his reported of diarrhea he seems fluid overloaded.  Will be cautious with diuresis  #2 diarrhea: We will follow patient symptoms to the hospital.  We will send stool for studies to rule out infectious causes.  He is afebrile no white count.  I will not initiate antibiotics at this point.  #3 tobacco abuse: Offered nicotine patch.  #4 diabetes: Sliding scale insulin  with home regimen.  #5 essential hypertension: Continue blood pressure control.  #6 atrial fibrillation: Rate is controlled.  On chronic anticoagulation.  #7 peripheral vascular disease: Status post bilateral AKA.  Continue monitor.   DVT prophylaxis: Eliquis Code Status: Full code Family Communication: Daughter at bedside Disposition Plan: To be determined Consults called: None Admission status: Inpatient  Severity of Illness: The appropriate patient status for this patient is INPATIENT. Inpatient status is judged to be reasonable and necessary in order to provide the required intensity of service to ensure the patient's safety. The patient's presenting  symptoms, physical exam findings, and initial radiographic and laboratory data in the context of their chronic comorbidities is felt to place them at high risk for further clinical deterioration. Furthermore, it is not anticipated that the patient will be medically stable for discharge from the hospital within 2 midnights of admission. The following factors support the patient status of inpatient.   " The patient's presenting symptoms include diarrhea with shortness of breath. " The worrisome physical exam findings include dry mucous membranes and bilateral crackles. " The initial radiographic and laboratory data are worrisome because of evidence of fluid overload. " The chronic co-morbidities include diabetes with CHF.   * I certify that at the point of admission it is my clinical judgment that the patient will require inpatient hospital care spanning beyond 2 midnights from the point of admission due to high intensity of service, high risk for further deterioration and high frequency of surveillance required.Barbette Merino MD [facility] Pager [phone]  If 7PM-7AM, please contact night-coverage [email or web address] Password [facility]  08/09/2019, 6:22 PM

## 2019-08-09 NOTE — ED Provider Notes (Signed)
Medical screening examination/treatment/procedure(s) were conducted as a shared visit with non-physician practitioner(s) and myself.  I personally evaluated the patient during the encounter.  EKG Interpretation  Date/Time:  Tuesday August 09 2019 14:43:51 EDT Ventricular Rate:  113 PR Interval:    QRS Duration: 96 QT Interval:  344 QTC Calculation: 471 R Axis:   54 Text Interpretation: Atrial fibrillation with rapid ventricular response Low voltage QRS Septal infarct , age undetermined Abnormal ECG agree, no sig change in QRS morphology from previous Confirmed by Charlesetta Shanks [phone]) on 08/09/2019 5:13:54 PM  Patient several complaints.  His primary complaint from his perspective is diarrhea.  He reports that now whenever he eats he ends up getting watery diarrhea and about 20 to 30 minutes.  Is a very big inconvenience.  Not having abdominal pain or fever.  Is been going on for a number of months.  Reports are some dietary things that make it worse or better.  Also he complains of some shortness of breath.  No fever or chest pain.  No productive cough.  Patient is alert and nontoxic.  Mental status clear.  Heart irregularly irregular.  Respiratory distress at rest.  Lungs grossly clear.  Abdomen soft without guarding.  Bilateral above-the-knee amputations.  Agree with plan of management.   Charlesetta Shanks, MD 08/09/19 [phone]

## 2019-08-09 NOTE — ED Provider Notes (Signed)
[name] Provider Note   CSN: [id number] Arrival date & time: 08/09/19  1418     History Chief Complaint  Patient presents with  . Shortness of Breath    [name] [name] is a 86 y.o. male with a past medical history of CHF with an EF of 45% on echo done November 2020, prior DVT, hypertension, bilateral AKA's, CKD presenting to the ED with multiple complaints. First complaint is diarrhea.  States that for the past 2 to 3 months he has had persistent, watery diarrhea daily.  States that "no matter what I eat it still happens."  Reports abdominal cramping when he has the diarrhea but no pain otherwise.  States that he has told his PCP about this but has not been on any medications to help with the diarrhea.  He cannot recall an event 2 to 3 months ago that may have triggered the diarrhea, denies any recent antibiotic use or bloody stools. He is concerned that he cannot eat much food at the risk of having more diarrhea.  Denies any nausea, vomiting, urinary symptoms, fever or sick contacts with similar symptoms. Second complaint is shortness of breath.  Reports for the past 1 to 2 months he has had feelings of "being winded" with certain movements.  He also reports persistent scrotal swelling since his admission about 6 months ago.  States that the swelling has not significantly improved.  He does not feel like his dose of Lasix is helping with the swelling.  Reports some cough but denies any chest pain, hemoptysis, lightheadedness, loss of consciousness, injuries or falls.  HPI     Past Medical History:  Diagnosis Date  . Chronic kidney disease    RENAL INSUFICCIENCY  . Diabetes mellitus without complication ([place])   . DVT (deep venous thrombosis) ([place])   . Gout   . Hypertension   . Lymphadenitis 08/10/2012  . Psoriasis     Patient Active Problem List   Diagnosis Date Noted  . PAF (paroxysmal atrial fibrillation) ([place])   . Scrotal swelling   .  Hypotension   . CHF exacerbation ([place]) 03/18/2019  . Acute on chronic systolic CHF (congestive heart failure) ([place]) 11/23/2018  . Troponin level elevated 11/23/2018  . Acute on chronic heart failure ([place])   . Atrial fibrillation and flutter ([place])   . Atrial fibrillation with RVR ([place]) 07/15/2018  . S/P AKA (above knee amputation) bilateral ([place])   . Pressure injury of skin 10/04/2017  . Hyperkalemia 11/17/2016  . Venous stasis ulcer ([place]) 11/17/2016  . Venous stasis ulcer of lower extremity ([place]) 05/23/2015  . Cellulitis of both lower extremities 10/18/2014  . Chronic renal insufficiency, stage 3 (moderate) ([place]) 10/18/2014  . HTN (hypertension), benign 10/18/2014  . Chronic diastolic CHF (congestive heart failure) ([place]) 10/18/2014  . Lower extremity cellulitis 10/18/2014  . PAD (peripheral artery disease) ([place]) 02/02/2014  . Atherosclerosis of native arteries of the extremities with ulceration ([place]) 07/22/2013  . Acute renal failure ([place]) 11/15/2012  . Fall 11/15/2012  . Diabetic ulcer of left foot ([place]) 10/15/2012  . Diabetes mellitus ([place]) 10/15/2012  . Lymphadenitis 08/07/2012  . Cellulitis 08/07/2012  . HTN (hypertension) 08/05/2012  . Renal insufficiency 08/05/2012  . Tobacco abuse 08/05/2012  . Gout 08/05/2012  . Hyperglycemia 08/05/2012    Past Surgical History:  Procedure Laterality Date  . AMPUTATION Bilateral 10/08/2017   Procedure: Bilateral  ABOVE KNEE amputations;  Surgeon: Conrad Seffner, MD;  Location: [facility];  Service: Vascular;  Laterality: Bilateral;       Family History  Problem Relation Age of Onset  . Hypertension Mother   . Stroke Mother   . Hypertension Father     Social History   Tobacco Use  . Smoking status: Former Smoker    Years: 40.00    Types: Pipe  . Smokeless tobacco: Never Used  Substance Use Topics  . Alcohol use: No  . Drug use: No    Home Medications Prior to Admission medications   Medication Sig Start Date End Date  Taking? Authorizing Provider  ELIQUIS 5 MG TABS tablet TAKE 1 TABLET BY MOUTH TWICE DAILY Patient taking differently: Take 5 mg by mouth 2 (two) times daily.  07/25/19  Yes Fulp, Cammie, MD  furosemide (LASIX) 40 MG tablet Take 0.5 tablets (20 mg total) by mouth daily. Take 2 pills (40mg ) by mouth in the morning and 1 pill (20mg ) by mouth in the evening. Patient taking differently: Take 20 mg by mouth 2 (two) times daily.  03/25/19  Yes Agyei, Caprice Kluver, MD  gabapentin (NEURONTIN) 100 MG capsule PATIENT TAKES 2 CAPSULES IN THE MORNING AND 2 CAPSULES EVERY EVENING AND 3 CAPSULES AT BEDTIME TIME Patient taking differently: Take 100 mg by mouth See admin instructions. PATIENT TAKES 2 CAPSULES IN THE MORNING AND 2 CAPSULES EVERY EVENING AND 3 CAPSULES AT BEDTIME TIME 02/11/19  Yes Fulp, Cammie, MD  fluocinolone (SYNALAR) 0.025 % cream Apply topically 2 (two) times daily. To the scalp and sparingly to the face Patient taking differently: Apply 1 application topically 2 (two) times daily. To the scalp and sparingly to the face 05/27/19   Fulp, Cammie, MD  hydrocortisone cream 1 % Apply topically 2 (two) times daily. 11/25/18   Shelly Coss, MD  metoprolol succinate (TOPROL-XL) 100 MG 24 hr tablet Take 100 mg by mouth daily. Take with or immediately following a meal.    [provider]  Potassium Chloride ER 20 MEQ TBCR Take 20 mEq by mouth daily. 03/30/19 04/29/19  Skeet Latch, MD  silver sulfADIAZINE (SILVADENE) 1 % cream Apply 1 application topically daily. Once daily to area of skin irritation on the right stump x 10 days then as needed Patient taking differently: Apply 1 application topically See admin instructions. Once daily to area of skin irritation on the right stump x 10 days then as needed 05/27/19   Fulp, Cammie, MD  triamcinolone ointment (KENALOG) 0.1 % Apply 1 application topically 2 (two) times daily as needed (skin irritation.).  07/27/19   [provider]    Allergies      Patient has no known allergies.  Review of Systems   Review of Systems  Constitutional: Negative for appetite change, chills and fever.  HENT: Negative for ear pain, rhinorrhea, sneezing and sore throat.   Eyes: Negative for photophobia and visual disturbance.  Respiratory: Positive for cough and shortness of breath. Negative for chest tightness and wheezing.   Cardiovascular: Negative for chest pain and palpitations.  Gastrointestinal: Positive for diarrhea. Negative for abdominal pain, blood in stool, constipation, nausea and vomiting.  Genitourinary: Negative for dysuria, hematuria and urgency.  Musculoskeletal: Negative for myalgias.  Skin: Negative for rash.  Neurological: Negative for dizziness, weakness and light-headedness.    Physical Exam Updated Vital Signs BP 111/88   Pulse 86   Temp 98 F (36.7 C) (Oral)   Resp 18   SpO2 99%   Physical Exam Vitals and nursing note reviewed. Exam conducted with  a chaperone present.  Constitutional:      General: He is not in acute distress.    Appearance: He is well-developed.  HENT:     Head: Normocephalic and atraumatic.     Nose: Nose normal.  Eyes:     General: No scleral icterus.       Right eye: No discharge.        Left eye: No discharge.     Conjunctiva/sclera: Conjunctivae normal.  Cardiovascular:     Rate and Rhythm: Regular rhythm. Tachycardia present.     Heart sounds: Normal heart sounds. No murmur. No friction rub. No gallop.   Pulmonary:     Effort: Pulmonary effort is normal. No respiratory distress.     Comments: Crackles in bilateral lung fields. Abdominal:     General: Bowel sounds are normal. There is no distension.     Palpations: Abdomen is soft.     Tenderness: There is no abdominal tenderness. There is no guarding.  Genitourinary:    Comments: Diffuse scrotal swelling without tenderness, warmth, erythema or wounds. Musculoskeletal:        General: Normal range of motion.     Cervical back:  Normal range of motion and neck supple.     Comments: Bilateral AKAs.  Skin:    General: Skin is warm and dry.     Findings: Rash present.     Comments: Chronic psoriatic rash throughout back and scalp.  Neurological:     Mental Status: He is alert.     Motor: No abnormal muscle tone.     Coordination: Coordination normal.      ED Results / Procedures / Treatments   Labs (all labs ordered are listed, but only abnormal results are displayed) Labs Reviewed  CBC WITH DIFFERENTIAL/PLATELET - Abnormal; Notable for the following components:      Result Value   WBC 3.1 (*)    MCV 100.2 (*)    RDW 17.7 (*)    Lymphs Abs 0.6 (*)    All other components within normal limits  COMPREHENSIVE METABOLIC PANEL - Abnormal; Notable for the following components:   Glucose, Bld 149 (*)    Creatinine, Ser 1.43 (*)    Albumin 3.0 (*)    Total Bilirubin 3.1 (*)    GFR calc non Af Amer 43 (*)    GFR calc Af Amer 50 (*)    All other components within normal limits  BRAIN NATRIURETIC PEPTIDE - Abnormal; Notable for the following components:   B Natriuretic Peptide 2,983.7 (*)    All other components within normal limits  TROPONIN I (HIGH SENSITIVITY) - Abnormal; Notable for the following components:   Troponin I (High Sensitivity) 116 (*)    All other components within normal limits  TROPONIN I (HIGH SENSITIVITY) - Abnormal; Notable for the following components:   Troponin I (High Sensitivity) 115 (*)    All other components within normal limits  SARS CORONAVIRUS 2 (TAT 6-24 HRS)    EKG EKG Interpretation  Date/Time:  Tuesday August 09 2019 14:43:51 EDT Ventricular Rate:  113 PR Interval:    QRS Duration: 96 QT Interval:  344 QTC Calculation: 471 R Axis:   54 Text Interpretation: Atrial fibrillation with rapid ventricular response Low voltage QRS Septal infarct , age undetermined Abnormal ECG agree, no sig change in QRS morphology from previous Confirmed by Charlesetta Shanks [id number]) on  08/09/2019 5:13:54 PM   Radiology DG Chest 2 View  Result Date: 08/09/2019 CLINICAL DATA:  Shortness of breath. EXAM: CHEST - 2 VIEW COMPARISON:  Two-view chest x-ray 03/18/2019 FINDINGS: Heart is enlarged. Mild edema is present. Bilateral pleural effusions and associated dependent airspace disease is evident. Upper lung fields are clear. Degenerative changes are again noted at both shoulders. IMPRESSION: 1. Cardiomegaly with mild edema and bilateral pleural effusions compatible with congestive heart failure. 2. Bibasilar airspace disease likely reflects atelectasis. Electronically Signed   By: San Morelle M.D.   On: 08/09/2019 15:39    Procedures .Critical Care Performed by: Delia Heady, PA-C Authorized by: Delia Heady, PA-C   Critical care provider statement:    Critical care time (minutes):  35   Critical care time was exclusive of:  Separately billable procedures and treating other patients   Critical care was necessary to treat or prevent imminent or life-threatening deterioration of the following conditions:  Cardiac failure, respiratory failure, circulatory failure, metabolic crisis and dehydration   Critical care was time spent personally by me on the following activities:  Development of treatment plan with patient or surrogate, discussions with consultants, evaluation of patient's response to treatment, examination of patient, obtaining history from patient or surrogate, review of old charts, re-evaluation of patient's condition, pulse oximetry, ordering and review of radiographic studies and ordering and review of laboratory studies   I assumed direction of critical care for this patient from another provider in my specialty: no     (including critical care time)  Medications Ordered in ED Medications  furosemide (LASIX) injection 40 mg (has no administration in time range)    ED Course  I have reviewed the triage vital signs and the nursing notes.  Pertinent labs  & imaging results that were available during my care of the patient were reviewed by me and considered in my medical decision making (see chart for details).  Clinical Course as of Aug 09 1806  Tue Aug 09, 2019  1707 Troponin I (High Sensitivity)(!!): 116 [HK]  1728 Higher than any priors.  B Natriuretic Peptide(!): 2,983.7 [HK]  1728 Suspect elevated trop 2/2 CHF exacerbation.   [HK]  1801 Troponin I (High Sensitivity)(!!): 115 [HK]    Clinical Course User Index [HK] Delia Heady, PA-C   MDM Rules/Calculators/A&P                      86 year old male with past medical history of CHF with an EF of 45%, prior DVT on Eliquis, hypertension, bilateral AKA's, CKD presenting to the ED with complaints of diarrhea and shortness of breath.  Concerning his diarrhea, reports chronic watery diarrhea for the past 2 to 3 months.  Daughter is concerned this is due to the food choices that he makes.  Denies any abdominal pain.  On exam abdomen is soft, nontender nondistended.  He denies any vomiting, recent antibiotic use or fever.  Electrolytes without any derangements today. Suspect this could be 2/2 food intake, educated on food choices for diarrhea. Also complains of shortness of breath and testicle swelling.  Reports chronic testicle swelling since his admission last year November 2020.  He has been taking his 60 mg of Lasix but is concerned this is not helping him.  He has diffuse scrotal edema on exam without erythema, tenderness or wounds. Crackles in lungs bilaterally.  Chest x-ray showing signs of fluid overload.  Both troponins have been elevated but appears stable at 115.  EKG without any acute changes.  He is in A. fib with rates in 90-100.  BNP significantly elevated  to almost 3000.  This is significantly higher than any of his prior values.  Suspect this is the cause of his troponin elevation as well.  He will need admission for diuretics. Patient agreeable to admission.  MDM Number of Diagnoses  or Management Options Acute on chronic congestive heart failure, unspecified heart failure type ([place]): established, worsening Chronic diarrhea: new, needed workup Elevated troponin: new, needed workup   Amount and/or Complexity of Data Reviewed Clinical lab tests: ordered and reviewed Tests in the radiology section of CPT: ordered and reviewed Tests in the medicine section of CPT: ordered and reviewed Decide to obtain previous medical records or to obtain history from someone other than the patient: yes Obtain history from someone other than the patient: yes Review and summarize past medical records: yes Discuss the patient with other providers: yes Independent visualization of images, tracings, or specimens: yes  Risk of Complications, Morbidity, and/or Mortality Presenting problems: high Diagnostic procedures: high Management options: high   Final Clinical Impression(s) / ED Diagnoses Final diagnoses:  Acute on chronic congestive heart failure, unspecified heart failure type ([place])  Elevated troponin  Chronic diarrhea    Rx / DC Orders ED Discharge Orders    None     Portions of this note were generated with Dragon dictation software. Dictation errors may occur despite best attempts at proofreading.    Delia Heady, PA-C 08/09/19 1808    Charlesetta Shanks, MD 08/15/19 [phone]

## 2019-08-09 NOTE — ED Triage Notes (Signed)
Pt reports SOB for "a week or so" requesting a "work up" due to having diarrhea and unable to eat certain foods.

## 2019-08-10 ENCOUNTER — Encounter (HOSPITAL_COMMUNITY): Payer: Self-pay | Admitting: Internal Medicine

## 2019-08-10 ENCOUNTER — Inpatient Hospital Stay (HOSPITAL_COMMUNITY): Payer: Medicare Other

## 2019-08-10 DIAGNOSIS — I5031 Acute diastolic (congestive) heart failure: Secondary | ICD-10-CM | POA: Diagnosis not present

## 2019-08-10 DIAGNOSIS — I5043 Acute on chronic combined systolic (congestive) and diastolic (congestive) heart failure: Secondary | ICD-10-CM | POA: Diagnosis not present

## 2019-08-10 LAB — GLUCOSE, CAPILLARY
Glucose-Capillary: 94 mg/dL (ref 70–99)
Glucose-Capillary: 72 mg/dL (ref 70–99)
Glucose-Capillary: 111 mg/dL — ABNORMAL HIGH (ref 70–99)
Glucose-Capillary: 90 mg/dL (ref 70–99)

## 2019-08-10 LAB — CBC WITH DIFFERENTIAL/PLATELET
Abs Immature Granulocytes: 0.01 10*3/uL (ref 0.00–0.07)
Basophils Absolute: 0 10*3/uL (ref 0.0–0.1)
Basophils Relative: 1 %
Eosinophils Absolute: 0.1 10*3/uL (ref 0.0–0.5)
Eosinophils Relative: 2 %
HCT: 44.6 % (ref 39.0–52.0)
Hemoglobin: 14.7 g/dL (ref 13.0–17.0)
Immature Granulocytes: 0 %
Lymphocytes Relative: 29 %
Lymphs Abs: 0.7 10*3/uL (ref 0.7–4.0)
MCH: 33 pg (ref 26.0–34.0)
MCHC: 33 g/dL (ref 30.0–36.0)
MCV: 100 fL (ref 80.0–100.0)
Monocytes Absolute: 0.4 10*3/uL (ref 0.1–1.0)
Monocytes Relative: 14 %
Neutro Abs: 1.4 10*3/uL — ABNORMAL LOW (ref 1.7–7.7)
Neutrophils Relative %: 54 %
Platelets: 172 10*3/uL (ref 150–400)
RBC: 4.46 MIL/uL (ref 4.22–5.81)
RDW: 17.7 % — ABNORMAL HIGH (ref 11.5–15.5)
WBC: 2.5 10*3/uL — ABNORMAL LOW (ref 4.0–10.5)
nRBC: 0 % (ref 0.0–0.2)

## 2019-08-10 LAB — BASIC METABOLIC PANEL
Anion gap: 12 (ref 5–15)
BUN: 13 mg/dL (ref 8–23)
CO2: 29 mmol/L (ref 22–32)
Calcium: 8.9 mg/dL (ref 8.9–10.3)
Chloride: 100 mmol/L (ref 98–111)
Creatinine, Ser: 1.29 mg/dL — ABNORMAL HIGH (ref 0.61–1.24)
GFR calc Af Amer: 56 mL/min — ABNORMAL LOW (ref >60)
GFR calc non Af Amer: 48 mL/min — ABNORMAL LOW (ref >60)
Glucose, Bld: 126 mg/dL — ABNORMAL HIGH (ref 70–99)
Potassium: 3.2 mmol/L — ABNORMAL LOW (ref 3.5–5.1)
Sodium: 141 mmol/L (ref 135–145)

## 2019-08-10 LAB — SARS CORONAVIRUS 2 (TAT 6-24 HRS): SARS Coronavirus 2: NEGATIVE

## 2019-08-10 LAB — ECHOCARDIOGRAM LIMITED: Weight: 2500.9 oz

## 2019-08-10 MED ORDER — POTASSIUM CHLORIDE CRYS ER 20 MEQ PO TBCR
40.0000 meq | EXTENDED_RELEASE_TABLET | Freq: Two times a day (BID) | ORAL | Status: AC
  Administered 2019-08-10 – 2019-08-11 (×4): 40 meq via ORAL
  Filled 2019-08-10 (×4): qty 2

## 2019-08-10 NOTE — Progress Notes (Signed)
Patient refused CBG check, stated not diabetic and wants the order removed.   Elaina Hoops, RN

## 2019-08-10 NOTE — Progress Notes (Signed)
PROGRESS NOTE    [name] [name]  NTZ:[id number] DOB: [birth year] DOA: 08/09/2019 PCP: [name], MD    Brief Narrative:  86 year old male with history of congestive heart failure, systolic with known ejection fraction of 45%, hypertension, anasarca, diabetes, psoriasis, gout and hypertension who presented to the emergency room with shortness of breath for few days, diarrhea on and off for more than a month.  Patient also has paroxysmal A. fib and was also found with A. Fib. In the emergency room, afebrile.  Blood pressure 136/97.  Heart rate 112.  Seven 7% on room air and 98% on 2 L.  proBNP 3000.  Troponins normal.  Creatinine 1.43.  COVID-19 negative.  Chest x-ray with cardiomegaly.  Patient was admitted to hospital with acute congestive heart failure.  Assessment & Plan:   Principal Problem:   Acute on chronic combined systolic and diastolic CHF (congestive heart failure) (HCC) Active Problems:   Tobacco abuse   Diabetes mellitus (HCC)   Chronic renal insufficiency, stage 3 (moderate) (HCC)   HTN (hypertension), benign   S/P AKA (above knee amputation) bilateral (HCC)   Acute on chronic heart failure (HCC)   Atrial fibrillation and flutter (HCC)   Troponin level elevated  Acute on chronic combined systolic and diastolic congestive heart failure: Previous EF 45%.  New EF 25-30%. Clinically stabilizing with IV diuresis. Continue IV Lasix today.  Patient was recently taken off on metoprolol XL, will put him back on.  Creatinine fluctuating, not a good candidate for ACE inhibitor's. Intake and output monitoring.  Daily weight. Will discuss with his cardiologist.  Chronic diarrhea: Cause unknown.  No loose stools since admission.  If he has any diarrhea, will send for investigations.  Chronic kidney disease stage IIIb: At about his baseline.  Close monitoring on IV Lasix.  Chronic atrial fibrillation: Rate controlled.  On metoprolol.  Therapeutic on Eliquis.  Peripheral vascular  disease status post bilateral above-knee amputation, wheelchair-bound: Stable.   DVT prophylaxis: Eliquis Code Status: Partial code Family Communication: Patient's daughter on the phone Disposition Plan: patient is from home. Anticipated DC to home with home health, Barriers to discharge treatment for active congestive heart failure.   Consultants:   None  Procedures:   None  Antimicrobials:   None   Subjective: Patient seen and examined.  No overnight events.  Poor historian.  He thinks his shortness of breath is far better than yesterday.  He is able to sleep last night.  Objective: Vitals:   08/09/19 2245 08/09/19 2319 08/10/19 0439 08/10/19 1138  BP: 119/87 (!) 133/104 127/87 110/84  Pulse: 89 81 82 86  Resp:  20 20 20   Temp:  98.4 F (36.9 C) 98.4 F (36.9 C) (!) 97.2 F (36.2 C)  TempSrc:  Oral Oral Oral  SpO2: 96% 98% 96% 100%  Weight:  70.9 kg 70.9 kg     Intake/Output Summary (Last 24 hours) at 08/10/2019 1427 Last data filed at 08/10/2019 0440 Gross per 24 hour  Intake --  Output 1800 ml  Net -1800 ml   Filed Weights   08/09/19 2319 08/10/19 0439  Weight: 70.9 kg 70.9 kg    Examination:  General exam: Appears calm and comfortable , chronically sick looking not in any distress. Respiratory system: Occasional expiratory crackles. Cardiovascular system: S1 & S2 heard, irregularly irregular.  No murmurs.  Gastrointestinal system: Abdomen is nondistended, soft and nontender.  JVD difficult to assess. Skin with diffuse psoriasis lesions. Bilateral above-knee amputations, no obvious edema.  Data Reviewed: I have personally reviewed following labs and imaging studies  CBC: Recent Labs  Lab 08/09/19 1530 08/10/19 0413  WBC 3.1* 2.5*  NEUTROABS 2.0 1.4*  HGB 14.7 14.7  HCT 45.6 44.6  MCV 100.2* 100.0  PLT 193 887   Basic Metabolic Panel: Recent Labs  Lab 08/09/19 1530 08/10/19 0413  NA 140 141  K 3.6 3.2*  CL 99 100  CO2 26 29   GLUCOSE 149* 126*  BUN 14 13  CREATININE 1.43* 1.29*  CALCIUM 9.4 8.9   GFR: Estimated Creatinine Clearance: 22.5 mL/min (A) (by C-G formula based on SCr of 1.29 mg/dL (H)). Liver Function Tests: Recent Labs  Lab 08/09/19 1530  AST 24  ALT 11  ALKPHOS 53  BILITOT 3.1*  PROT 7.2  ALBUMIN 3.0*   No results for input(s): LIPASE, AMYLASE in the last 168 hours. No results for input(s): AMMONIA in the last 168 hours. Coagulation Profile: No results for input(s): INR, PROTIME in the last 168 hours. Cardiac Enzymes: No results for input(s): CKTOTAL, CKMB, CKMBINDEX, TROPONINI in the last 168 hours. BNP (last 3 results) No results for input(s): PROBNP in the last 8760 hours. HbA1C: No results for input(s): HGBA1C in the last 72 hours. CBG: Recent Labs  Lab 08/09/19 2217 08/10/19 0825 08/10/19 1215  GLUCAP 94 94 72   Lipid Profile: No results for input(s): CHOL, HDL, LDLCALC, TRIG, CHOLHDL, LDLDIRECT in the last 72 hours. Thyroid Function Tests: No results for input(s): TSH, T4TOTAL, FREET4, T3FREE, THYROIDAB in the last 72 hours. Anemia Panel: No results for input(s): VITAMINB12, FOLATE, FERRITIN, TIBC, IRON, RETICCTPCT in the last 72 hours. Sepsis Labs: No results for input(s): PROCALCITON, LATICACIDVEN in the last 168 hours.  Recent Results (from the past 240 hour(s))  SARS CORONAVIRUS 2 (TAT 6-24 HRS) Nasopharyngeal Nasopharyngeal Swab     Status: None   Collection Time: 08/09/19  6:03 PM   Specimen: Nasopharyngeal Swab  Result Value Ref Range Status   SARS Coronavirus 2 NEGATIVE NEGATIVE Final    Comment: (NOTE) SARS-CoV-2 target nucleic acids are NOT DETECTED. The SARS-CoV-2 RNA is generally detectable in upper and lower respiratory specimens during the acute phase of infection. Negative results do not preclude SARS-CoV-2 infection, do not rule out co-infections with other pathogens, and should not be used as the sole basis for treatment or other patient  management decisions. Negative results must be combined with clinical observations, patient history, and epidemiological information. The expected result is Negative. Fact Sheet for Patients: SugarRoll.be Fact Sheet for Healthcare Providers: https://www.woods-mathews.com/ This test is not yet approved or cleared by the [place] FDA and  has been authorized for detection and/or diagnosis of SARS-CoV-2 by FDA under an Emergency Use Authorization (EUA). This EUA will remain  in effect (meaning this test can be used) for the duration of the COVID-19 declaration under Section 56 4(b)(1) of the Act, 21 U.S.C. section 360bbb-3(b)(1), unless the authorization is terminated or revoked sooner. Performed at [facility], [place] [address], [place], [place] [address]          Radiology Studies: DG Chest 2 View  Result Date: 08/09/2019 CLINICAL DATA:  Shortness of breath. EXAM: CHEST - 2 VIEW COMPARISON:  Two-view chest x-ray 03/18/2019 FINDINGS: Heart is enlarged. Mild edema is present. Bilateral pleural effusions and associated dependent airspace disease is evident. Upper lung fields are clear. Degenerative changes are again noted at both shoulders. IMPRESSION: 1. Cardiomegaly with mild edema and bilateral pleural effusions compatible with  congestive heart failure. 2. Bibasilar airspace disease likely reflects atelectasis. Electronically Signed   By: San Morelle M.D.   On: 08/09/2019 15:39   ECHOCARDIOGRAM LIMITED  Result Date: 08/10/2019    ECHOCARDIOGRAM LIMITED REPORT   Patient Name:   [name] [name] Date of Exam: 08/10/2019 Medical Rec #:  [id number]   Height:       48.0 in Accession #:    [id number]  Weight:       156.3 lb Date of Birth:  22-Aug-1926    BSA:          1.430 m Patient Age:    10 years    BP:           127/87 mmHg Patient Gender: M           HR:           92 bpm. Exam Location:  Inpatient Procedure: Limited Echo, Color Doppler,  Cardiac Doppler and 3D Echo Indications:    W29.56 Acute diastolic (congestive) heart failure  History:        Patient has prior history of Echocardiogram examinations, most                 recent 03/23/2019. CHF, Arrythmias:Atrial Flutter and Atrial                 Fibrillation; Risk Factors:Diabetes and Hypertension.  Sonographer:    Raquel Sarna Senior RDCS Referring Phys: [id number] [place]  1. Left ventricular ejection fraction, by estimation, is 25 to 30%. The left ventricle has severely decreased function. The left ventricle demonstrates global hypokinesis. There is moderate akinesis of the left ventricular, entire anteroseptal wall.  2. The mitral valve is grossly normal. Mild mitral valve regurgitation.  3. Tricuspid valve regurgitation is mild to moderate.  4. The aortic valve is grossly normal. Aortic valve regurgitation is not visualized. No aortic stenosis is present.  5. There is moderately elevated pulmonary artery systolic pressure. FINDINGS  Left Ventricle: Left ventricular ejection fraction, by estimation, is 25 to 30%. The left ventricle has severely decreased function. The left ventricle demonstrates global hypokinesis. Moderate akinesis of the left ventricular, entire anteroseptal wall. Right Ventricle: There is moderately elevated pulmonary artery systolic pressure. The tricuspid regurgitant velocity is 3.23 m/s, and with an assumed right atrial pressure of 15 mmHg, the estimated right ventricular systolic pressure is 78.4 mmHg. Mitral Valve: The mitral valve is grossly normal. Mild mitral valve regurgitation. Tricuspid Valve: The tricuspid valve is grossly normal. Tricuspid valve regurgitation is mild to moderate. Aortic Valve: The aortic valve is grossly normal. Aortic valve regurgitation is not visualized. No aortic stenosis is present. There is mild calcification of the aortic valve.  RIGHT VENTRICLE RV S prime:     3.71 cm/s TAPSE (M-mode): 1.0 cm AORTIC VALVE LVOT Vmax:   52.20  cm/s LVOT Vmean:  30.600 cm/s LVOT VTI:    0.074 m TRICUSPID VALVE TR Peak grad:   41.7 mmHg TR Vmax:        323.00 cm/s  SHUNTS Systemic VTI: 0.07 m Mertie Moores MD Electronically signed by Mertie Moores MD Signature Date/Time: 08/10/2019/12:57:36 PM    Final         Scheduled Meds: . apixaban  5 mg Oral BID  . furosemide  40 mg Intravenous BID  . insulin aspart  0-20 Units Subcutaneous TID WC  . insulin aspart  0-5 Units Subcutaneous QHS  . potassium chloride  40 mEq Oral BID  . sodium chloride  flush  3 mL Intravenous Q12H   Continuous Infusions: . sodium chloride       LOS: 1 day    Time spent: 25 minutes     Barb Merino, MD [facility] Pager [phone]

## 2019-08-10 NOTE — Progress Notes (Signed)
Echocardiogram 2D Echocardiogram has been performed.  Oneal Deputy Lakita Sahlin 08/10/2019, 10:12 AM

## 2019-08-11 DIAGNOSIS — I5043 Acute on chronic combined systolic (congestive) and diastolic (congestive) heart failure: Secondary | ICD-10-CM | POA: Diagnosis not present

## 2019-08-11 LAB — GLUCOSE, CAPILLARY: Glucose-Capillary: 79 mg/dL (ref 70–99)

## 2019-08-11 LAB — CBC WITH DIFFERENTIAL/PLATELET
Abs Immature Granulocytes: 0.02 10*3/uL (ref 0.00–0.07)
Basophils Absolute: 0 10*3/uL (ref 0.0–0.1)
Basophils Relative: 1 %
Eosinophils Absolute: 0.1 10*3/uL (ref 0.0–0.5)
Eosinophils Relative: 2 %
HCT: 44.4 % (ref 39.0–52.0)
Hemoglobin: 14.7 g/dL (ref 13.0–17.0)
Immature Granulocytes: 1 %
Lymphocytes Relative: 28 %
Lymphs Abs: 0.8 10*3/uL (ref 0.7–4.0)
MCH: 32.7 pg (ref 26.0–34.0)
MCHC: 33.1 g/dL (ref 30.0–36.0)
MCV: 98.9 fL (ref 80.0–100.0)
Monocytes Absolute: 0.4 10*3/uL (ref 0.1–1.0)
Monocytes Relative: 13 %
Neutro Abs: 1.6 10*3/uL — ABNORMAL LOW (ref 1.7–7.7)
Neutrophils Relative %: 55 %
Platelets: 179 10*3/uL (ref 150–400)
RBC: 4.49 MIL/uL (ref 4.22–5.81)
RDW: 17.6 % — ABNORMAL HIGH (ref 11.5–15.5)
WBC: 2.9 10*3/uL — ABNORMAL LOW (ref 4.0–10.5)
nRBC: 0 % (ref 0.0–0.2)

## 2019-08-11 LAB — BASIC METABOLIC PANEL
Anion gap: 14 (ref 5–15)
BUN: 12 mg/dL (ref 8–23)
CO2: 28 mmol/L (ref 22–32)
Calcium: 8.8 mg/dL — ABNORMAL LOW (ref 8.9–10.3)
Chloride: 98 mmol/L (ref 98–111)
Creatinine, Ser: 1.32 mg/dL — ABNORMAL HIGH (ref 0.61–1.24)
GFR calc Af Amer: 55 mL/min — ABNORMAL LOW (ref >60)
GFR calc non Af Amer: 47 mL/min — ABNORMAL LOW (ref >60)
Glucose, Bld: 87 mg/dL (ref 70–99)
Potassium: 4.1 mmol/L (ref 3.5–5.1)
Sodium: 140 mmol/L (ref 135–145)

## 2019-08-11 LAB — HEMOGLOBIN A1C
Hgb A1c MFr Bld: 5.8 % — ABNORMAL HIGH (ref 4.8–5.6)
Mean Plasma Glucose: 120 mg/dL

## 2019-08-11 LAB — PHOSPHORUS: Phosphorus: 3.2 mg/dL (ref 2.5–4.6)

## 2019-08-11 LAB — MAGNESIUM: Magnesium: 1.9 mg/dL (ref 1.7–2.4)

## 2019-08-11 MED ORDER — GABAPENTIN 300 MG PO CAPS
300.0000 mg | ORAL_CAPSULE | Freq: Every day | ORAL | Status: DC
  Administered 2019-08-12: 01:00:00 300 mg via ORAL
  Filled 2019-08-11: qty 1

## 2019-08-11 MED ORDER — METOPROLOL SUCCINATE ER 50 MG PO TB24
50.0000 mg | ORAL_TABLET | Freq: Two times a day (BID) | ORAL | Status: AC
  Administered 2019-08-11 (×2): 50 mg via ORAL
  Filled 2019-08-11 (×2): qty 1

## 2019-08-11 MED ORDER — METOPROLOL SUCCINATE ER 100 MG PO TB24
100.0000 mg | ORAL_TABLET | Freq: Every day | ORAL | Status: DC
  Administered 2019-08-12: 100 mg via ORAL
  Filled 2019-08-11: qty 1

## 2019-08-11 MED ORDER — GABAPENTIN 100 MG PO CAPS
200.0000 mg | ORAL_CAPSULE | Freq: Two times a day (BID) | ORAL | Status: DC
  Administered 2019-08-12: 200 mg via ORAL
  Filled 2019-08-11: qty 2

## 2019-08-11 MED ORDER — HYDROCORTISONE 1 % EX CREA
TOPICAL_CREAM | Freq: Two times a day (BID) | CUTANEOUS | Status: DC
  Filled 2019-08-11: qty 28

## 2019-08-11 NOTE — Discharge Instructions (Addendum)
Diarrhea, Adult Diarrhea is when you pass loose and watery poop (stool) often. Diarrhea can make you feel weak and cause you to lose water in your body (get dehydrated). Losing water in your body can cause you to:  Feel tired and thirsty.  Have a dry mouth.  Go pee (urinate) less often. Diarrhea often lasts 2-3 days. However, it can last longer if it is a sign of something more serious. It is important to treat your diarrhea as told by your doctor. Follow these instructions at home: Eating and drinking     Follow these instructions as told by your doctor:  Take an ORS (oral rehydration solution). This is a drink that helps you replace fluids and minerals your body lost. It is sold at pharmacies and stores.  Drink plenty of fluids, such as: ? Water. ? Ice chips. ? Diluted fruit juice. ? Low-calorie sports drinks. ? Milk, if you want.  Avoid drinking fluids that have a lot of sugar or caffeine in them.  Eat bland, easy-to-digest foods in small amounts as you are able. These foods include: ? Bananas. ? Applesauce. ? Rice. ? Low-fat (lean) meats. ? Toast. ? Crackers.  Avoid alcohol.  Avoid spicy or fatty foods.  Medicines  Take over-the-counter and prescription medicines only as told by your doctor.  If you were prescribed an antibiotic medicine, take it as told by your doctor. Do not stop using the antibiotic even if you start to feel better. General instructions   Wash your hands often using soap and water. If soap and water are not available, use a hand sanitizer. Others in your home should wash their hands as well. Hands should be washed: ? After using the toilet or changing a diaper. ? Before preparing, cooking, or serving food. ? While caring for a sick person. ? While visiting someone in a hospital.  Drink enough fluid to keep your pee (urine) pale yellow.  Rest at home while you get better.  Watch your condition for any changes.  Take a warm bath to  help with any burning or pain from having diarrhea.  Keep all follow-up visits as told by your doctor. This is important. Contact a doctor if:  You have a fever.  Your diarrhea gets worse.  You have new symptoms.  You cannot keep fluids down.  You feel light-headed or dizzy.  You have a headache.  You have muscle cramps. Get help right away if:  You have chest pain.  You feel very weak or you pass out (faint).  You have bloody or black poop or poop that looks like tar.  You have very bad pain, cramping, or bloating in your belly (abdomen).  You have trouble breathing or you are breathing very quickly.  Your heart is beating very quickly.  Your skin feels cold and clammy.  You feel confused.  You have signs of losing too much water in your body, such as: ? Dark pee, very little pee, or no pee. ? Cracked lips. ? Dry mouth. ? Sunken eyes. ? Sleepiness. ? Weakness. Summary  Diarrhea is when you pass loose and watery poop (stool) often.  Diarrhea can make you feel weak and cause you to lose water in your body (get dehydrated).  Take an ORS (oral rehydration solution). This is a drink that is sold at pharmacies and stores.  Eat bland, easy-to-digest foods in small amounts as you are able.  Contact a doctor if your condition gets worse. Get help  right away if you have signs that you have lost too much water in your body. This information is not intended to replace advice given to you by your health care provider. Make sure you discuss any questions you have with your health care provider. Document Revised: 09/18/2017 Document Reviewed: 09/18/2017 Elsevier Patient Education  2020 [place] Choices to Help Relieve Diarrhea, Adult When you have diarrhea, the foods you eat and your eating habits are very important. Choosing the right foods and drinks can help:  Relieve diarrhea.  Replace lost fluids and nutrients.  Prevent dehydration. What general  guidelines should I follow?  Relieving diarrhea  Choose foods with less than 2 g or .07 oz. of fiber per serving.  Limit fats to less than 8 tsp (38 g or 1.34 oz.) a day.  Avoid the following: ? Foods and beverages sweetened with high-fructose corn syrup, honey, or sugar alcohols such as xylitol, sorbitol, and mannitol. ? Foods that contain a lot of fat or sugar. ? Fried, greasy, or spicy foods. ? High-fiber grains, breads, and cereals. ? Raw fruits and vegetables.  Eat foods that are rich in probiotics. These foods include dairy products such as yogurt and fermented milk products. They help increase healthy bacteria in the stomach and intestines (gastrointestinal tract, or GI tract).  If you have lactose intolerance, avoid dairy products. These may make your diarrhea worse.  Take medicine to help stop diarrhea (antidiarrheal medicine) only as told by your health care provider. Replacing nutrients  Eat small meals or snacks every 3-4 hours.  Eat bland foods, such as white rice, toast, or baked potato, until your diarrhea starts to get better. Gradually reintroduce nutrient-rich foods as tolerated or as told by your health care provider. This includes: ? Well-cooked protein foods. ? Peeled, seeded, and soft-cooked fruits and vegetables. ? Low-fat dairy products.  Take vitamin and mineral supplements as told by your health care provider. Preventing dehydration  Start by sipping water or a special solution to prevent dehydration (oral rehydration solution, ORS). Urine that is clear or pale yellow means that you are getting enough fluid.  Try to drink at least 8-10 cups of fluid each day to help replace lost fluids.  You may add other liquids in addition to water, such as clear juice or decaffeinated sports drinks, as tolerated or as told by your health care provider.  Avoid drinks with caffeine, such as coffee, tea, or soft drinks.  Avoid alcohol. What foods are recommended?      The items listed may not be a complete list. Talk with your health care provider about what dietary choices are best for you. Grains White rice. White, [place], or pita breads (fresh or toasted), including plain rolls, buns, or bagels. White pasta. Saltine, soda, or graham crackers. Pretzels. Low-fiber cereal. Cooked cereals made with water (such as cornmeal, farina, or cream cereals). Plain muffins. Matzo. Melba toast. Zwieback. Vegetables Potatoes (without the skin). Most well-cooked and canned vegetables without skins or seeds. Tender lettuce. Fruits Apple sauce. Fruits canned in juice. Cooked apricots, cherries, grapefruit, peaches, pears, or plums. Fresh bananas and cantaloupe. Meats and other protein foods Baked or boiled chicken. Eggs. Tofu. Fish. Seafood. Smooth nut butters. Ground or well-cooked tender beef, ham, veal, lamb, pork, or poultry. Dairy Plain yogurt, kefir, and unsweetened liquid yogurt. Lactose-free milk, buttermilk, skim milk, or soy milk. Low-fat or nonfat hard cheese. Beverages Water. Low-calorie sports drinks. Fruit juices without pulp. Strained tomato and vegetable juices.  Decaffeinated teas. Sugar-free beverages not sweetened with sugar alcohols. Oral rehydration solutions, if approved by your health care provider. Seasoning and other foods Bouillon, broth, or soups made from recommended foods. What foods are not recommended? The items listed may not be a complete list. Talk with your health care provider about what dietary choices are best for you. Grains Whole grain, whole wheat, bran, or rye breads, rolls, pastas, and crackers. Wild or brown rice. Whole grain or bran cereals. Barley. Oats and oatmeal. Corn tortillas or taco shells. Granola. Popcorn. Vegetables Raw vegetables. Fried vegetables. Cabbage, broccoli, Brussels sprouts, artichokes, baked beans, beet greens, corn, kale, legumes, peas, sweet potatoes, and yams. Potato skins. Cooked spinach and  cabbage. Fruits Dried fruit, including raisins and dates. Raw fruits. Stewed or dried prunes. Canned fruits with syrup. Meat and other protein foods Fried or fatty meats. Deli meats. Chunky nut butters. Nuts and seeds. Beans and lentils. Berniece Salines. Hot dogs. Sausage. Dairy High-fat cheeses. Whole milk, chocolate milk, and beverages made with milk, such as milk shakes. Half-and-half. Cream. sour cream. Ice cream. Beverages Caffeinated beverages (such as coffee, tea, soda, or energy drinks). Alcoholic beverages. Fruit juices with pulp. Prune juice. Soft drinks sweetened with high-fructose corn syrup or sugar alcohols. High-calorie sports drinks. Fats and oils Butter. Cream sauces. Margarine. Salad oils. Plain salad dressings. Olives. Avocados. Mayonnaise. Sweets and desserts Sweet rolls, doughnuts, and sweet breads. Sugar-free desserts sweetened with sugar alcohols such as xylitol and sorbitol. Seasoning and other foods Honey. Hot sauce. Chili powder. Gravy. Cream-based or milk-based soups. Pancakes and waffles. Summary  When you have diarrhea, the foods you eat and your eating habits are very important.  Make sure you get at least 8-10 cups of fluid each day, or enough to keep your urine clear or pale yellow.  Eat bland foods and gradually reintroduce healthy, nutrient-rich foods as tolerated, or as told by your health care provider.  Avoid high-fiber, fried, greasy, or spicy foods. This information is not intended to replace advice given to you by your health care provider. Make sure you discuss any questions you have with your health care provider. Document Revised: 08/05/2018 Document Reviewed: 04/11/2016 Elsevier Patient Education  [place] on my medicine - ELIQUIS (apixaban)  Why was Eliquis prescribed for you? Eliquis was prescribed for you to reduce the risk of a blood clot forming that can cause a stroke if you have a medical condition called atrial  fibrillation (a type of irregular heartbeat).  What do You need to know about Eliquis ? Take your Eliquis TWICE DAILY - one tablet in the morning and one tablet in the evening with or without food. If you have difficulty swallowing the tablet whole please discuss with your pharmacist how to take the medication safely.  Take Eliquis exactly as prescribed by your doctor and DO NOT stop taking Eliquis without talking to the doctor who prescribed the medication.  Stopping may increase your risk of developing a stroke.  Refill your prescription before you run out.  After discharge, you should have regular check-up appointments with your healthcare provider that is prescribing your Eliquis.  In the future your dose may need to be changed if your kidney function or weight changes by a significant amount or as you get older.  What do you do if you miss a dose? If you miss a dose, take it as soon as you remember on the same day and resume taking twice daily.  Do not take more than  one dose of ELIQUIS at the same time to make up a missed dose.  Important Safety Information A possible side effect of Eliquis is bleeding. You should call your healthcare provider right away if you experience any of the following: ? Bleeding from an injury or your nose that does not stop. ? Unusual colored urine (red or dark brown) or unusual colored stools (red or black). ? Unusual bruising for unknown reasons. ? A serious fall or if you hit your head (even if there is no bleeding).  Some medicines may interact with Eliquis and might increase your risk of bleeding or clotting while on Eliquis. To help avoid this, consult your healthcare provider or pharmacist prior to using any new prescription or non-prescription medications, including herbals, vitamins, non-steroidal anti-inflammatory drugs (NSAIDs) and supplements.  This website has more information on Eliquis (apixaban): http://www.eliquis.com/eliquis/home

## 2019-08-11 NOTE — Consult Note (Addendum)
Cardiology Consultation:   Patient ID: [name] [name]; [id number]; Aug 29, 1926   Admit date: 08/09/2019 Date of Consult: 08/11/2019  Primary Care Provider: [name], MD Primary Cardiologist: Skeet Latch, MD 03/28/2019 Primary Electrophysiologist:  None   Patient Profile:   [name] [name] is a 86 y.o. male with a hx of S-D-CHF w/ EF 15%>>40%, DM, HTN, HLD, CKD III, DVT, PAF on Eliquis, PAD s/p bilat AKA, and dementia, who is being seen today for the evaluation of CHF at the request of Dr Sloan Leiter.  History of Present Illness:   [name] [name] was seen in the office 11/30, post-hospital. Wt 162 lbs, volume ok, metoprolol (along w/ losartan held for low BP) was restarted.    He was admitted 04/13 with diarrhea and SOB. Cards asked to see for CHF.  [name] [name] is oriented to name and place.  He says he has been having diarrhea for 3 or 4 months.  He is concerned that he might have eaten too much of something.    He has been having shortness of breath for at least a month.  When asked about orthopnea or PND, the answer is unclear.  He does have orthopnea now as he is less short of breath sitting up.  He denies palpitations or chest pain.  He is not able to give me any additional concrete information.  Information was obtained from staff and chart notes.   Past Medical History:  Diagnosis Date  . Chronic kidney disease    RENAL INSUFICCIENCY  . Diabetes mellitus without complication ([place])   . DVT (deep venous thrombosis) ([place])   . Gout   . Hypertension   . Lymphadenitis 08/10/2012  . Psoriasis     Past Surgical History:  Procedure Laterality Date  . AMPUTATION Bilateral 10/08/2017   Procedure: Bilateral  ABOVE KNEE amputations;  Surgeon: Conrad Valle Crucis, MD;  Location: [facility] OR;  Service: Vascular;  Laterality: Bilateral;     Prior to Admission medications   Medication Sig Start Date End Date Taking? Authorizing Provider  ELIQUIS 5 MG TABS tablet TAKE 1 TABLET BY MOUTH TWICE DAILY Patient  taking differently: Take 5 mg by mouth 2 (two) times daily.  07/25/19  Yes Fulp, Cammie, MD  fluocinolone (SYNALAR) 0.025 % cream Apply topically 2 (two) times daily. To the scalp and sparingly to the face Patient taking differently: Apply 1 application topically 2 (two) times daily. To the scalp and sparingly to the face 05/27/19  Yes Fulp, Cammie, MD  furosemide (LASIX) 40 MG tablet Take 0.5 tablets (20 mg total) by mouth daily. Take 2 pills (40mg ) by mouth in the morning and 1 pill (20mg ) by mouth in the evening. Patient taking differently: Take 20 mg by mouth 2 (two) times daily.  03/25/19  Yes Agyei, Caprice Kluver, MD  gabapentin (NEURONTIN) 100 MG capsule PATIENT TAKES 2 CAPSULES IN THE MORNING AND 2 CAPSULES EVERY EVENING AND 3 CAPSULES AT BEDTIME TIME Patient taking differently: Take 100 mg by mouth See admin instructions. Take 200mg  in the morning, 200mg  every evening and 300mg  at bedtime. 02/11/19  Yes Fulp, Cammie, MD  hydrocortisone cream 1 % Apply topically 2 (two) times daily. 11/25/18  Yes Shelly Coss, MD  silver sulfADIAZINE (SILVADENE) 1 % cream Apply 1 application topically daily. Once daily to area of skin irritation on the right stump x 10 days then as needed Patient taking differently: Apply 1 application topically See admin instructions. Once daily to area of skin irritation on the right stump x  10 days then as needed 05/27/19  Yes Fulp, Cammie, MD  triamcinolone ointment (KENALOG) 0.1 % Apply 1 application topically 2 (two) times daily as needed (skin irritation.).  07/27/19  Yes [provider]  metoprolol succinate (TOPROL-XL) 100 MG 24 hr tablet Take 100 mg by mouth daily. Take with or immediately following a meal.    [provider]  Potassium Chloride ER 20 MEQ TBCR Take 20 mEq by mouth daily. Patient not taking: Reported on 08/10/2019 03/30/19 04/29/19  Skeet Latch, MD    Inpatient Medications: Scheduled Meds: . apixaban  5 mg Oral BID  . furosemide  40 mg  Intravenous BID  . potassium chloride  40 mEq Oral BID  . sodium chloride flush  3 mL Intravenous Q12H   Continuous Infusions: . sodium chloride     PRN Meds: sodium chloride, acetaminophen, ondansetron (ZOFRAN) IV, sodium chloride flush  Allergies:   No Known Allergies  Social History:   Social History   Socioeconomic History  . Marital status: Widowed    Spouse name: Not on file  . Number of children: Not on file  . Years of education: Not on file  . Highest education level: Not on file  Occupational History  . Occupation: Retired  Tobacco Use  . Smoking status: Former Smoker    Years: 40.00    Types: Pipe  . Smokeless tobacco: Never Used  Substance and Sexual Activity  . Alcohol use: No  . Drug use: No  . Sexual activity: Not on file  Other Topics Concern  . Not on file  Social History Narrative   He has a room in his daughter's house.   Social Determinants of Health   Financial Resource Strain:   . Difficulty of Paying Living Expenses:   Food Insecurity:   . Worried About Charity fundraiser in the Last Year:   . Arboriculturist in the Last Year:   Transportation Needs:   . Film/video editor (Medical):   Marland Kitchen Lack of Transportation (Non-Medical):   Physical Activity:   . Days of Exercise per Week:   . Minutes of Exercise per Session:   Stress:   . Feeling of Stress :   Social Connections:   . Frequency of Communication with Friends and Family:   . Frequency of Social Gatherings with Friends and Family:   . Attends Religious Services:   . Active Member of Clubs or Organizations:   . Attends Archivist Meetings:   Marland Kitchen Marital Status:   Intimate Partner Violence:   . Fear of Current or Ex-Partner:   . Emotionally Abused:   Marland Kitchen Physically Abused:   . Sexually Abused:     Family History:   Family History  Problem Relation Age of Onset  . Hypertension Mother   . Stroke Mother   . Hypertension Father    Family Status:  Family Status    Relation Name Status  . Mother  Deceased at age 51s  . Father  Deceased at age 57    ROS:  Please see the history of present illness.  All other ROS reviewed and negative.     Physical Exam/Data:   Vitals:   08/10/19 2054 08/11/19 0100 08/11/19 0640 08/11/19 0815  BP: 114/78  126/88 114/86  Pulse: 94  87 96  Resp: 19  17 16   Temp: 98 F (36.7 C)  98 F (36.7 C) 98.2 F (36.8 C)  TempSrc: Oral  Oral Oral  SpO2:  99% 100% 100% 95%  Weight:   68.8 kg     Intake/Output Summary (Last 24 hours) at 08/11/2019 1109 Last data filed at 08/11/2019 0646 Gross per 24 hour  Intake 717 ml  Output 1700 ml  Net -983 ml    Last 3 Weights 08/11/2019 08/10/2019 08/09/2019  Weight (lbs) 151 lb 10.8 oz 156 lb 4.9 oz 156 lb 6.4 oz  Weight (kg) 68.8 kg 70.9 kg 70.943 kg     Body mass index is 46.28 kg/m.   General:  Well nourished, frail, elderly male in no acute distress HEENT: normal Lymph: no adenopathy Neck: JVD -9 cm Endocrine:  No thryomegaly Vascular: No carotid bruits; upper extremity pulses 2+  Cardiac:  normal S1, S2; irregular rate and rhythm; no murmur Lungs: Decreased breath sounds bases bilaterally due to poor inspiration, no wheezing, rhonchi, + rales  Abd: soft, nontender, no hepatomegaly  Ext: no edema Musculoskeletal:  No deformities, BUE  strength normal and equal Skin: warm and dry  Neuro:  CNs 2-12 intact, no focal abnormalities noted Psych:  Normal affect   EKG:  The EKG was personally reviewed and demonstrates: 1/13 ECG is atrial fibrillation, heart rate 92, diffuse T wave flattening is old, borderline low voltage in the extremity leads Telemetry:  Telemetry was personally reviewed and demonstrates: Atrial fibrillation, no significant ectopy, rate 70-100   CV studies:   ECHO: 08/10/2019 1. Left ventricular ejection fraction, by estimation, is 25 to 30%. The  left ventricle has severely decreased function. The left ventricle  demonstrates global hypokinesis.  There is moderate akinesis of the left ventricular, entire anteroseptal wall.  2. The mitral valve is grossly normal. Mild mitral valve regurgitation.  3. Tricuspid valve regurgitation is mild to moderate.  4. The aortic valve is grossly normal. Aortic valve regurgitation is not  visualized. No aortic stenosis is present.  5. There is moderately elevated pulmonary artery systolic pressure.  RV systolic pressure is 67.2 mmHg  Laboratory Data:   Chemistry Recent Labs  Lab 08/09/19 1530 08/10/19 0413 08/11/19 0637  NA 140 141 140  K 3.6 3.2* 4.1  CL 99 100 98  CO2 26 29 28   GLUCOSE 149* 126* 87  BUN 14 13 12   CREATININE 1.43* 1.29* 1.32*  CALCIUM 9.4 8.9 8.8*  GFRNONAA 43* 48* 47*  GFRAA 50* 56* 55*  ANIONGAP 15 12 14     Lab Results  Component Value Date   ALT 11 08/09/2019   AST 24 08/09/2019   ALKPHOS 53 08/09/2019   BILITOT 3.1 (H) 08/09/2019   Hematology Recent Labs  Lab 08/09/19 1530 08/10/19 0413 08/11/19 0637  WBC 3.1* 2.5* 2.9*  RBC 4.55 4.46 4.49  HGB 14.7 14.7 14.7  HCT 45.6 44.6 44.4  MCV 100.2* 100.0 98.9  MCH 32.3 33.0 32.7  MCHC 32.2 33.0 33.1  RDW 17.7* 17.7* 17.6*  PLT 193 172 179   Cardiac Enzymes High Sensitivity Troponin:   Recent Labs  Lab 08/09/19 1530 08/09/19 1630  TROPONINIHS 116* 115*      BNP Recent Labs  Lab 08/09/19 1543  BNP 2,983.7*    TSH:  Lab Results  Component Value Date   TSH 3.091 07/15/2018   Lipids: Lab Results  Component Value Date   CHOL 197 10/08/2018   HDL 58 10/08/2018   LDLCALC 118 (H) 10/08/2018   TRIG 103 10/08/2018   CHOLHDL 3.4 10/08/2018   HgbA1c: Lab Results  Component Value Date   HGBA1C 5.8 (H) 08/09/2019  Magnesium:  Magnesium  Date Value Ref Range Status  08/11/2019 1.9 1.7 - 2.4 mg/dL Final    Comment:    Performed at [facility], [place] [address] [place], [place] [address]     Radiology/Studies:  DG Chest 2 View  Result Date: 08/09/2019 CLINICAL DATA:   Shortness of breath. EXAM: CHEST - 2 VIEW COMPARISON:  Two-view chest x-ray 03/18/2019 FINDINGS: Heart is enlarged. Mild edema is present. Bilateral pleural effusions and associated dependent airspace disease is evident. Upper lung fields are clear. Degenerative changes are again noted at both shoulders. IMPRESSION: 1. Cardiomegaly with mild edema and bilateral pleural effusions compatible with congestive heart failure. 2. Bibasilar airspace disease likely reflects atelectasis. Electronically Signed   By: San Morelle M.D.   On: 08/09/2019 15:39   ECHOCARDIOGRAM LIMITED  Result Date: 08/10/2019    ECHOCARDIOGRAM LIMITED REPORT   Patient Name:   NEHAN FLAUM Date of Exam: 08/10/2019 Medical Rec #:  [id number]   Height:       48.0 in Accession #:    [id number]  Weight:       156.3 lb Date of Birth:  1927-01-01    BSA:          1.430 m Patient Age:    68 years    BP:           127/87 mmHg Patient Gender: M           HR:           92 bpm. Exam Location:  Inpatient Procedure: Limited Echo, Color Doppler, Cardiac Doppler and 3D Echo Indications:    S97.02 Acute diastolic (congestive) heart failure  History:        Patient has prior history of Echocardiogram examinations, most                 recent 03/23/2019. CHF, Arrythmias:Atrial Flutter and Atrial                 Fibrillation; Risk Factors:Diabetes and Hypertension.  Sonographer:    Raquel Sarna Senior RDCS Referring Phys: [id number] [place]  1. Left ventricular ejection fraction, by estimation, is 25 to 30%. The left ventricle has severely decreased function. The left ventricle demonstrates global hypokinesis. There is moderate akinesis of the left ventricular, entire anteroseptal wall.  2. The mitral valve is grossly normal. Mild mitral valve regurgitation.  3. Tricuspid valve regurgitation is mild to moderate.  4. The aortic valve is grossly normal. Aortic valve regurgitation is not visualized. No aortic stenosis is present.  5. There is moderately  elevated pulmonary artery systolic pressure. FINDINGS  Left Ventricle: Left ventricular ejection fraction, by estimation, is 25 to 30%. The left ventricle has severely decreased function. The left ventricle demonstrates global hypokinesis. Moderate akinesis of the left ventricular, entire anteroseptal wall. Right Ventricle: There is moderately elevated pulmonary artery systolic pressure. The tricuspid regurgitant velocity is 3.23 m/s, and with an assumed right atrial pressure of 15 mmHg, the estimated right ventricular systolic pressure is 50.2 mmHg. Mitral Valve: The mitral valve is grossly normal. Mild mitral valve regurgitation. Tricuspid Valve: The tricuspid valve is grossly normal. Tricuspid valve regurgitation is mild to moderate. Aortic Valve: The aortic valve is grossly normal. Aortic valve regurgitation is not visualized. No aortic stenosis is present. There is mild calcification of the aortic valve.  RIGHT VENTRICLE RV S prime:     3.71 cm/s TAPSE (M-mode): 1.0 cm AORTIC VALVE LVOT Vmax:   52.20 cm/s LVOT  Vmean:  30.600 cm/s LVOT VTI:    0.074 m TRICUSPID VALVE TR Peak grad:   41.7 mmHg TR Vmax:        323.00 cm/s  SHUNTS Systemic VTI: 0.07 m Mertie Moores MD Electronically signed by Mertie Moores MD Signature Date/Time: 08/10/2019/12:57:36 PM    Final     Assessment and Plan:   1.  Acute on chronic combined systolic and diastolic CHF: -His EF was initially 15-20%.  However, after treatment, it improved to 40-45%. -After his last hospitalization, his blood pressure became soft when on Lasix 40 mg a.m. and 20 mg p.m. -His metoprolol was restarted by Dr. Oval Linsey, but she did not feel his blood pressure was high enough to restart the losartan. -He was admitted with worsening shortness of breath and is EF is now 25-30%. -Follow renal function, intake/output, and weights with diuresis - I/O net - 2.4 L so far, wt down 5 lbs -Continue IV Lasix for now but with concurrent diarrhea, do not want to  over-diurese.  2.  Diarrhea: -The patient speaks as though it is ongoing.  However, only 1 stool was charted 4/13 and 4/14.  3.  Cardiomyopathy: -After his hospitalization in November, losartan was held due to soft blood pressures and the metoprolol was felt to be more important because of his chronic A. fib. -He is currently on a beta-blocker - if renal function is stable with diuresis, consider restarting losartan at 12.5 mg a day and see how tolerated - Do not think blood pressure is high enough to tolerate Entresto - do not think renal function is good enough to tolerate spironolactone -Do not think blood pressure is high enough to tolerate hydralazine/nitrates -Although he has wall motion abnormalities on his echo, there are no reports of chest pain. -In the past, Dr. Oval Linsey has deferred an ischemic evaluation due to decreased renal function and the fact that his cardiomyopathy was initially felt secondary to tachycardia -Continue to treat medically, watch for symptoms  4.  Permanent atrial fibrillation -He was on Toprol-XL 100 mg daily -Heart rate is currently controlled, but he is not on the Toprol.  There may be a computer issue as the home med list says it is ordered -We will order it as Toprol-XL 50 mg twice daily, that may prevent his blood pressure from dropping too much -Continue Eliquis for anticoagulation  Otherwise, per IM Principal Problem:   Acute on chronic combined systolic and diastolic CHF (congestive heart failure) ([facility]) Active Problems:   Tobacco abuse   Diabetes mellitus ([place])   Chronic renal insufficiency, stage 3 (moderate) ([facility])   HTN (hypertension), benign   S/P AKA (above knee amputation) bilateral ([facility])   Acute on chronic heart failure ([facility])   Atrial fibrillation and flutter ([facility])   Troponin level elevated     For questions or updates, please contact [facility] Please consult [email or web address] for contact info under Cardiology/STEMI.    SignedRosaria Ferries, PA-C  08/11/2019 11:09 AM

## 2019-08-11 NOTE — Progress Notes (Signed)
PROGRESS NOTE    Darby Shadwick  JJK:[id number] DOB: 16-Oct-1926 DOA: 08/09/2019 PCP: [name], MD    Brief Narrative:  86 year old male with history of congestive heart failure, systolic with known ejection fraction of 45%, hypertension, anasarca, diabetes, psoriasis, gout and hypertension who presented to the emergency room with shortness of breath for few days, diarrhea on and off for more than a month.  Patient also has paroxysmal A. fib and was also found with A. Fib. In the emergency room, afebrile.  Blood pressure 136/97.  Heart rate 112.  77% on room air and 98% on 2 L.  proBNP 3000.  Troponins normal.  Creatinine 1.43.  COVID-19 negative.  Chest x-ray with cardiomegaly.  Patient was admitted to hospital with acute congestive heart failure.  Assessment & Plan:   Principal Problem:   Acute on chronic combined systolic and diastolic CHF (congestive heart failure) (HCC) Active Problems:   Tobacco abuse   Diabetes mellitus (HCC)   Chronic renal insufficiency, stage 3 (moderate) (HCC)   HTN (hypertension), benign   S/P AKA (above knee amputation) bilateral (HCC)   Acute on chronic heart failure (HCC)   Atrial fibrillation and flutter (HCC)   Troponin level elevated  Acute on chronic combined systolic and diastolic congestive heart failure: Previous EF 45%.  New EF 25-30%. Clinically stabilizing with IV diuresis.  Responding well. Continue IV Lasix today.  Patient was recently taken off on metoprolol XL, will put him back on.  Creatinine fluctuating, not a good candidate for ACE inhibitor's. Intake and output monitoring.  Daily weight. We will consult his cardiologist while in the hospital.  Chronic diarrhea: Cause unknown.  No loose stools since admission.  If he has any diarrhea, will send for investigations.  Probably benign.  Chronic kidney disease stage IIIb: At about his baseline.  Close monitoring on IV Lasix.  Chronic atrial fibrillation: Rate controlled.  On metoprolol.   Therapeutic on Eliquis.  Peripheral vascular disease status post bilateral above-knee amputation, wheelchair-bound: Stable.  Extensive psoriatic lesions: Local steroids.   DVT prophylaxis: Eliquis Code Status: Partial code Family Communication: Patient's daughter on the phone Disposition Plan: patient is from home. Anticipated DC to home with home health, Barriers to discharge treatment for active congestive heart failure.   Consultants:   Cardiology  Procedures:   None  Antimicrobials:   None   Subjective: Patient seen and examined.  No overnight events.  On room air.  Poor historian, he thinks he is getting better.  Objective: Vitals:   08/10/19 2054 08/11/19 0100 08/11/19 0640 08/11/19 0815  BP: 114/78  126/88 114/86  Pulse: 94  87 96  Resp: 19  17 16   Temp: 98 F (36.7 C)  98 F (36.7 C) 98.2 F (36.8 C)  TempSrc: Oral  Oral Oral  SpO2: 99% 100% 100% 95%  Weight:   68.8 kg     Intake/Output Summary (Last 24 hours) at 08/11/2019 1047 Last data filed at 08/11/2019 0646 Gross per 24 hour  Intake 717 ml  Output 1700 ml  Net -983 ml   Filed Weights   08/09/19 2319 08/10/19 0439 08/11/19 0640  Weight: 70.9 kg 70.9 kg 68.8 kg    Examination:  General exam: Appears calm and comfortable , chronically sick looking not in any distress. Respiratory system: Occasional expiratory crackles at bases. Cardiovascular system: S1 & S2 heard, irregularly irregular.  No murmurs.   Gastrointestinal system: Abdomen is nondistended, soft and nontender.  JVD difficult to assess. Skin with diffuse  psoriasis lesions. Bilateral above-knee amputations, no obvious edema.    Data Reviewed: I have personally reviewed following labs and imaging studies  CBC: Recent Labs  Lab 08/09/19 1530 08/10/19 0413 08/11/19 0637  WBC 3.1* 2.5* 2.9*  NEUTROABS 2.0 1.4* 1.6*  HGB 14.7 14.7 14.7  HCT 45.6 44.6 44.4  MCV 100.2* 100.0 98.9  PLT 193 172 709   Basic Metabolic Panel:  Recent Labs  Lab 08/09/19 1530 08/10/19 0413 08/11/19 0637  NA 140 141 140  K 3.6 3.2* 4.1  CL 99 100 98  CO2 26 29 28   GLUCOSE 149* 126* 87  BUN 14 13 12   CREATININE 1.43* 1.29* 1.32*  CALCIUM 9.4 8.9 8.8*  MG  --   --  1.9  PHOS  --   --  3.2   GFR: Estimated Creatinine Clearance: 21.6 mL/min (A) (by C-G formula based on SCr of 1.32 mg/dL (H)). Liver Function Tests: Recent Labs  Lab 08/09/19 1530  AST 24  ALT 11  ALKPHOS 53  BILITOT 3.1*  PROT 7.2  ALBUMIN 3.0*   No results for input(s): LIPASE, AMYLASE in the last 168 hours. No results for input(s): AMMONIA in the last 168 hours. Coagulation Profile: No results for input(s): INR, PROTIME in the last 168 hours. Cardiac Enzymes: No results for input(s): CKTOTAL, CKMB, CKMBINDEX, TROPONINI in the last 168 hours. BNP (last 3 results) No results for input(s): PROBNP in the last 8760 hours. HbA1C: Recent Labs    08/09/19 1949  HGBA1C 5.8*   CBG: Recent Labs  Lab 08/10/19 0825 08/10/19 1215 08/10/19 1831 08/10/19 2059 08/11/19 0811  GLUCAP 94 72 111* 90 79   Lipid Profile: No results for input(s): CHOL, HDL, LDLCALC, TRIG, CHOLHDL, LDLDIRECT in the last 72 hours. Thyroid Function Tests: No results for input(s): TSH, T4TOTAL, FREET4, T3FREE, THYROIDAB in the last 72 hours. Anemia Panel: No results for input(s): VITAMINB12, FOLATE, FERRITIN, TIBC, IRON, RETICCTPCT in the last 72 hours. Sepsis Labs: No results for input(s): PROCALCITON, LATICACIDVEN in the last 168 hours.  Recent Results (from the past 240 hour(s))  SARS CORONAVIRUS 2 (TAT 6-24 HRS) Nasopharyngeal Nasopharyngeal Swab     Status: None   Collection Time: 08/09/19  6:03 PM   Specimen: Nasopharyngeal Swab  Result Value Ref Range Status   SARS Coronavirus 2 NEGATIVE NEGATIVE Final    Comment: (NOTE) SARS-CoV-2 target nucleic acids are NOT DETECTED. The SARS-CoV-2 RNA is generally detectable in upper and lower respiratory specimens during  the acute phase of infection. Negative results do not preclude SARS-CoV-2 infection, do not rule out co-infections with other pathogens, and should not be used as the sole basis for treatment or other patient management decisions. Negative results must be combined with clinical observations, patient history, and epidemiological information. The expected result is Negative. Fact Sheet for Patients: SugarRoll.be Fact Sheet for Healthcare Providers: https://www.woods-mathews.com/ This test is not yet approved or cleared by the [place] FDA and  has been authorized for detection and/or diagnosis of SARS-CoV-2 by FDA under an Emergency Use Authorization (EUA). This EUA will remain  in effect (meaning this test can be used) for the duration of the COVID-19 declaration under Section 56 4(b)(1) of the Act, 21 U.S.C. section 360bbb-3(b)(1), unless the authorization is terminated or revoked sooner. Performed at [facility], [place] [address], [place], [place] [address]          Radiology Studies: DG Chest 2 View  Result Date: 08/09/2019 CLINICAL DATA:  Shortness of  breath. EXAM: CHEST - 2 VIEW COMPARISON:  Two-view chest x-ray 03/18/2019 FINDINGS: Heart is enlarged. Mild edema is present. Bilateral pleural effusions and associated dependent airspace disease is evident. Upper lung fields are clear. Degenerative changes are again noted at both shoulders. IMPRESSION: 1. Cardiomegaly with mild edema and bilateral pleural effusions compatible with congestive heart failure. 2. Bibasilar airspace disease likely reflects atelectasis. Electronically Signed   By: San Morelle M.D.   On: 08/09/2019 15:39   ECHOCARDIOGRAM LIMITED  Result Date: 08/10/2019    ECHOCARDIOGRAM LIMITED REPORT   Patient Name:   GEROME KOKESH Date of Exam: 08/10/2019 Medical Rec #:  [id number]   Height:       48.0 in Accession #:    [id number]  Weight:       156.3 lb Date of  Birth:  December 05, 1926    BSA:          1.430 m Patient Age:    21 years    BP:           127/87 mmHg Patient Gender: M           HR:           92 bpm. Exam Location:  Inpatient Procedure: Limited Echo, Color Doppler, Cardiac Doppler and 3D Echo Indications:    Q67.61 Acute diastolic (congestive) heart failure  History:        Patient has prior history of Echocardiogram examinations, most                 recent 03/23/2019. CHF, Arrythmias:Atrial Flutter and Atrial                 Fibrillation; Risk Factors:Diabetes and Hypertension.  Sonographer:    Raquel Sarna Senior RDCS Referring Phys: [id number] [place]  1. Left ventricular ejection fraction, by estimation, is 25 to 30%. The left ventricle has severely decreased function. The left ventricle demonstrates global hypokinesis. There is moderate akinesis of the left ventricular, entire anteroseptal wall.  2. The mitral valve is grossly normal. Mild mitral valve regurgitation.  3. Tricuspid valve regurgitation is mild to moderate.  4. The aortic valve is grossly normal. Aortic valve regurgitation is not visualized. No aortic stenosis is present.  5. There is moderately elevated pulmonary artery systolic pressure. FINDINGS  Left Ventricle: Left ventricular ejection fraction, by estimation, is 25 to 30%. The left ventricle has severely decreased function. The left ventricle demonstrates global hypokinesis. Moderate akinesis of the left ventricular, entire anteroseptal wall. Right Ventricle: There is moderately elevated pulmonary artery systolic pressure. The tricuspid regurgitant velocity is 3.23 m/s, and with an assumed right atrial pressure of 15 mmHg, the estimated right ventricular systolic pressure is 71.2 mmHg. Mitral Valve: The mitral valve is grossly normal. Mild mitral valve regurgitation. Tricuspid Valve: The tricuspid valve is grossly normal. Tricuspid valve regurgitation is mild to moderate. Aortic Valve: The aortic valve is grossly normal. Aortic valve  regurgitation is not visualized. No aortic stenosis is present. There is mild calcification of the aortic valve.  RIGHT VENTRICLE RV S prime:     3.71 cm/s TAPSE (M-mode): 1.0 cm AORTIC VALVE LVOT Vmax:   52.20 cm/s LVOT Vmean:  30.600 cm/s LVOT VTI:    0.074 m TRICUSPID VALVE TR Peak grad:   41.7 mmHg TR Vmax:        323.00 cm/s  SHUNTS Systemic VTI: 0.07 m Mertie Moores MD Electronically signed by Mertie Moores MD Signature Date/Time: 08/10/2019/12:57:36 PM    Final  Scheduled Meds: . apixaban  5 mg Oral BID  . furosemide  40 mg Intravenous BID  . insulin aspart  0-20 Units Subcutaneous TID WC  . insulin aspart  0-5 Units Subcutaneous QHS  . potassium chloride  40 mEq Oral BID  . sodium chloride flush  3 mL Intravenous Q12H   Continuous Infusions: . sodium chloride       LOS: 2 days    Time spent: 25 minutes     Barb Merino, MD [facility] Pager [phone]

## 2019-08-12 DIAGNOSIS — I5043 Acute on chronic combined systolic (congestive) and diastolic (congestive) heart failure: Secondary | ICD-10-CM | POA: Diagnosis not present

## 2019-08-12 DIAGNOSIS — I429 Cardiomyopathy, unspecified: Secondary | ICD-10-CM

## 2019-08-12 DIAGNOSIS — I4821 Permanent atrial fibrillation: Secondary | ICD-10-CM | POA: Diagnosis not present

## 2019-08-12 LAB — BASIC METABOLIC PANEL
Anion gap: 12 (ref 5–15)
BUN: 16 mg/dL (ref 8–23)
CO2: 29 mmol/L (ref 22–32)
Calcium: 8.9 mg/dL (ref 8.9–10.3)
Chloride: 98 mmol/L (ref 98–111)
Creatinine, Ser: 1.44 mg/dL — ABNORMAL HIGH (ref 0.61–1.24)
GFR calc Af Amer: 49 mL/min — ABNORMAL LOW (ref >60)
GFR calc non Af Amer: 42 mL/min — ABNORMAL LOW (ref >60)
Glucose, Bld: 105 mg/dL — ABNORMAL HIGH (ref 70–99)
Potassium: 4.6 mmol/L (ref 3.5–5.1)
Sodium: 139 mmol/L (ref 135–145)

## 2019-08-12 MED ORDER — FUROSEMIDE 40 MG PO TABS: 40.0000 mg | ORAL_TABLET | Freq: Two times a day (BID) | ORAL | Status: DC

## 2019-08-12 MED ORDER — POTASSIUM CHLORIDE ER 20 MEQ PO TBCR: 20.0000 meq | EXTENDED_RELEASE_TABLET | Freq: Every day | ORAL | 0 refills | Status: AC

## 2019-08-12 MED ORDER — FUROSEMIDE 40 MG PO TABS: 40.0000 mg | ORAL_TABLET | Freq: Two times a day (BID) | ORAL | 5 refills | Status: DC

## 2019-08-12 MED ORDER — GABAPENTIN 100 MG PO CAPS: 100.0000 mg | ORAL_CAPSULE | ORAL | 0 refills | Status: DC

## 2019-08-12 NOTE — Progress Notes (Signed)
Progress Note  Patient Name: [name] [name] Date of Encounter: 08/12/2019  Primary Cardiologist: Skeet Latch, MD 03/28/2019  Subjective   Doing well this AM. Denies CP, no SOB. Up eating breakfast   Inpatient Medications    Scheduled Meds: . apixaban  5 mg Oral BID  . furosemide  40 mg Intravenous BID  . gabapentin  200 mg Oral BID  . gabapentin  300 mg Oral QHS  . hydrocortisone cream   Topical BID  . metoprolol succinate  100 mg Oral Daily  . sodium chloride flush  3 mL Intravenous Q12H   Continuous Infusions: . sodium chloride     PRN Meds: sodium chloride, acetaminophen, ondansetron (ZOFRAN) IV, sodium chloride flush   Vital Signs    Vitals:   08/11/19 1522 08/11/19 2004 08/11/19 2343 08/12/19 0317  BP: 90/71 108/89 106/86 107/77  Pulse:  80 79 73  Resp:  17  15  Temp:  97.9 F (36.6 C)  97.7 F (36.5 C)  TempSrc:  Oral  Oral  SpO2:  98%  100%  Weight:        Intake/Output Summary (Last 24 hours) at 08/12/2019 0742 Last data filed at 08/12/2019 0500 Gross per 24 hour  Intake 240 ml  Output 1900 ml  Net -1660 ml   Filed Weights   08/09/19 2319 08/10/19 0439 08/11/19 0640  Weight: 70.9 kg 70.9 kg 68.8 kg    Physical Exam   General: Elderly, pleasant, NAD Skin: Warm, dry, intact  Head: Normocephalic, atraumatic, sclera non-icteric, no xanthomas, clear, moist mucus membranes. Neck: Negative for carotid bruits. No JVD Lungs:Clear to ausculation bilaterally. Breathing is unlabored. Cardiovascular: Irregularly irregular with S1 S2. No murmurs Abdomen: Soft, non-tender, non-distended. No obvious abdominal masses. Extremities: No edema. Radial pulses 2+ bilaterally Neuro: Alert and oriented. No focal deficits. No facial asymmetry. MAE spontaneously. Psych: Responds to questions appropriately with normal affect.    Labs    Chemistry Recent Labs  Lab 08/09/19 1530 08/09/19 1530 08/10/19 0413 08/11/19 0637 08/12/19 0221  NA 140   < > 141  140 139  K 3.6   < > 3.2* 4.1 4.6  CL 99   < > 100 98 98  CO2 26   < > 29 28 29   GLUCOSE 149*   < > 126* 87 105*  BUN 14   < > 13 12 16   CREATININE 1.43*   < > 1.29* 1.32* 1.44*  CALCIUM 9.4   < > 8.9 8.8* 8.9  PROT 7.2  --   --   --   --   ALBUMIN 3.0*  --   --   --   --   AST 24  --   --   --   --   ALT 11  --   --   --   --   ALKPHOS 53  --   --   --   --   BILITOT 3.1*  --   --   --   --   GFRNONAA 43*   < > 48* 47* 42*  GFRAA 50*   < > 56* 55* 49*  ANIONGAP 15   < > 12 14 12    < > = values in this interval not displayed.     Hematology Recent Labs  Lab 08/09/19 1530 08/10/19 0413 08/11/19 0637  WBC 3.1* 2.5* 2.9*  RBC 4.55 4.46 4.49  HGB 14.7 14.7 14.7  HCT 45.6 44.6 44.4  MCV 100.2*  100.0 98.9  MCH 32.3 33.0 32.7  MCHC 32.2 33.0 33.1  RDW 17.7* 17.7* 17.6*  PLT 193 172 179    Cardiac EnzymesNo results for input(s): TROPONINI in the last 168 hours. No results for input(s): TROPIPOC in the last 168 hours.   BNP Recent Labs  Lab 08/09/19 1543  BNP 2,983.7*     DDimer No results for input(s): DDIMER in the last 168 hours.   Radiology    ECHOCARDIOGRAM LIMITED  Result Date: 08/10/2019    ECHOCARDIOGRAM LIMITED REPORT   Patient Name:   [name] [name] Date of Exam: 08/10/2019 Medical Rec #:  [id number]   Height:       48.0 in Accession #:    [id number]  Weight:       156.3 lb Date of Birth:  01-Nov-1926    BSA:          1.430 m Patient Age:    5 years    BP:           127/87 mmHg Patient Gender: M           HR:           92 bpm. Exam Location:  Inpatient Procedure: Limited Echo, Color Doppler, Cardiac Doppler and 3D Echo Indications:    J18.84 Acute diastolic (congestive) heart failure  History:        Patient has prior history of Echocardiogram examinations, most                 recent 03/23/2019. CHF, Arrythmias:Atrial Flutter and Atrial                 Fibrillation; Risk Factors:Diabetes and Hypertension.  Sonographer:    Raquel Sarna Senior RDCS Referring Phys: [id number] [place]  1. Left ventricular ejection fraction, by estimation, is 25 to 30%. The left ventricle has severely decreased function. The left ventricle demonstrates global hypokinesis. There is moderate akinesis of the left ventricular, entire anteroseptal wall.  2. The mitral valve is grossly normal. Mild mitral valve regurgitation.  3. Tricuspid valve regurgitation is mild to moderate.  4. The aortic valve is grossly normal. Aortic valve regurgitation is not visualized. No aortic stenosis is present.  5. There is moderately elevated pulmonary artery systolic pressure. FINDINGS  Left Ventricle: Left ventricular ejection fraction, by estimation, is 25 to 30%. The left ventricle has severely decreased function. The left ventricle demonstrates global hypokinesis. Moderate akinesis of the left ventricular, entire anteroseptal wall. Right Ventricle: There is moderately elevated pulmonary artery systolic pressure. The tricuspid regurgitant velocity is 3.23 m/s, and with an assumed right atrial pressure of 15 mmHg, the estimated right ventricular systolic pressure is 16.0 mmHg. Mitral Valve: The mitral valve is grossly normal. Mild mitral valve regurgitation. Tricuspid Valve: The tricuspid valve is grossly normal. Tricuspid valve regurgitation is mild to moderate. Aortic Valve: The aortic valve is grossly normal. Aortic valve regurgitation is not visualized. No aortic stenosis is present. There is mild calcification of the aortic valve.  RIGHT VENTRICLE RV S prime:     3.71 cm/s TAPSE (M-mode): 1.0 cm AORTIC VALVE LVOT Vmax:   52.20 cm/s LVOT Vmean:  30.600 cm/s LVOT VTI:    0.074 m TRICUSPID VALVE TR Peak grad:   41.7 mmHg TR Vmax:        323.00 cm/s  SHUNTS Systemic VTI: 0.07 m Mertie Moores MD Electronically signed by Mertie Moores MD Signature Date/Time: 08/10/2019/12:57:36 PM    Final    Telemetry  08/12/19 AF with rates in the 80's- Personally Reviewed  ECG    No new tracing as of 08/12/19- Personally  Reviewed  Cardiac Studies   Echocardiogram 08/10/19:  1. Left ventricular ejection fraction, by estimation, is 25 to 30%. The  left ventricle has severely decreased function. The left ventricle  demonstrates global hypokinesis. There is moderate akinesis of the left  ventricular, entire anteroseptal wall.  2. The mitral valve is grossly normal. Mild mitral valve regurgitation.  3. Tricuspid valve regurgitation is mild to moderate.  4. The aortic valve is grossly normal. Aortic valve regurgitation is not  visualized. No aortic stenosis is present.  5. There is moderately elevated pulmonary artery systolic pressure.    Echocardiogram 03/23/2019:  1. Left ventricular ejection fraction, by visual estimation, is 45%. The  left ventricle has mild to moderately decreased function. There is  moderately increased left ventricular hypertrophy.  2. Elevated left ventricular end-diastolic pressure.  3. Left ventricular diastolic parameters are indeterminate.  4. Global right ventricle has mildly reduced systolic function.The right  ventricular size is normal. No increase in right ventricular wall  thickness.  5. Moderately elevated pulmonary artery systolic pressure.  6. Left atrial size was severely dilated.  7. Right atrial size was mildly dilated.  8. The mitral valve is normal in structure. Mild mitral valve  regurgitation. No evidence of mitral stenosis.  9. The tricuspid valve is normal in structure. Tricuspid valve  regurgitation mild-moderate.  10. The aortic valve is normal in structure. Aortic valve regurgitation is  trivial. Mild aortic valve sclerosis without stenosis.  11. The pulmonic valve was normal in structure. Pulmonic valve  regurgitation is trivial.  12. The inferior vena cava is dilated in size with <50% respiratory  variability, suggesting right atrial pressure of 15 mmHg.   Patient Profile     86 y.o. male with a hx of S-D-CHF w/ EF 15%>>40%, DM,  HTN, HLD, CKD III, DVT, PAF on Eliquis, PAD s/p bilat AKA, and dementia, who is being seen today for the evaluation of CHF at the request of Dr Sloan Leiter.  Assessment & Plan    1.  Acute on chronic combined systolic and diastolic CHF: -EF initially found to be 15-20% with improvement after guideline directed therapy to 40-45%>>now back down to 25-30% with associated SOB -Has had issues with OP hypotension secondary to Lasix use>>too low to add losartan  -Weight, 151lb today>>>156lb on admission  -I&O, net negative 3.7L since admission  -Creatinine 1.44>>up from 1.32 today  -Continue IV Lasix for now but with concurrent diarrhea, do not want to over-diurese.  2.  Cardiomyopathy: -Losartan recently held due to soft blood pressures and the metoprolol was felt to be more important because of his chronic A. fib. -Would consider restart of losartan if renal function and BP is stable with diuresis -No reports of anginal symptoms despite RWM on echo  -Ischemic workup has been deferred in the past due to decreased renal function and the fact that his cardiomyopathy was initially felt secondary to tachycardia -Continue to treat medically, watch for symptoms  3.  Permanent atrial fibrillation: -Some medication discrepancies on admission>>>restarted on Toprol XL 50mg  PO QD -HR controlled today in the 80's  -Continue Eliquis for anticoagulation  Signed, Kathyrn Drown NP-C [facility] Pager: [phone] 08/12/2019, 7:42 AM     For questions or updates, please contact   Please consult [email or web address] for contact info under Cardiology/STEMI.

## 2019-08-12 NOTE — Plan of Care (Signed)
Pt discharged to home with h/h, d/c education completed with printed discharge summary, medication information and disease management given to pt , verbal understanding received from pt, left facility via [facility].

## 2019-08-12 NOTE — TOC Initial Note (Signed)
Transition of Care [facility]) - Initial/Assessment Note    Patient Details  Name: [name] [name] MRN: [id number] Date of Birth: 1926/06/17  Transition of Care [facility]) CM/SW Contact:    Bethena Roys, RN Phone Number: 08/12/2019, 11:08 AM  Clinical Narrative:  Patient presented for shortness of breath. Prior to arrival from home with family support. Patient is a bilateral amputee that uses a Hoveround. Patient previously used [facility] for [facility]. Case Manager received referral for Heart failure referral. Case Manager spoke with patient and provided patient with the Medicare.gov list. Patient wanted to use [facility] again- referral made and start of care to begin within 24-48 hours post transition home. Patient's daughter has set up [facility] for transportation home at 2:30. Patient is to meet in front of the ED for pickup. No further needs from Case Manager at this time.               Expected Discharge Plan: [place] Barriers to Discharge: No Barriers Identified   Patient Goals and CMS Choice Patient states their goals for this hospitalization and ongoing recovery are:: to return home CMS Medicare.gov Compare Post Acute Care list provided to:: Patient Choice offered to / list presented to : Patient, Adult Children(Spoke with daughter [name] as well.)  Expected Discharge Plan and Services Expected Discharge Plan: [place] In-house Referral: NA Discharge Planning Services: CM Consult Post Acute Care Choice: [place] arrangements for the past 2 months: Single Family Home                 DME Arranged: N/A         HH Arranged: RN, Disease Management, N[place] Agency: [place] Date [facility] Agency Contacted: 08/12/19 Time HH Agency Contacted: 71 Representative spoke with at [place]: Tommi Rumps  Prior Living Arrangements/Services Living arrangements for the past 2 months: [place] Lives with:: Adult  Children Patient language and need for interpreter reviewed:: Yes Do you feel safe going back to the place where you live?: Yes      Need for Family Participation in Patient Care: Yes (Comment) Care giver support system in place?: Yes (comment)   Criminal Activity/Legal Involvement Pertinent to Current Situation/Hospitalization: No - Comment as needed  Activities of Daily Living Home Assistive Devices/Equipment: Wheelchair, Other (Comment)(power wheelchair) ADL Screening (condition at time of admission) Patient's cognitive ability adequate to safely complete daily activities?: Yes Is the patient deaf or have difficulty hearing?: No Does the patient have difficulty seeing, even when wearing glasses/contacts?: No Does the patient have difficulty concentrating, remembering, or making decisions?: No Patient able to express need for assistance with ADLs?: Yes Does the patient have difficulty dressing or bathing?: Yes Independently performs ADLs?: No Communication: Independent Dressing (OT): Independent Is this a change from baseline?: Pre-admission baseline Grooming: Independent Feeding: Independent Bathing: Needs assistance Is this a change from baseline?: Pre-admission baseline Toileting: Needs assistance Is this a change from baseline?: Pre-admission baseline In/Out Bed: Independent with device (comment) Walks in Home: Dependent(bil BKA , electric wheelchair ) Does the patient have difficulty walking or climbing stairs?: Yes Weakness of Legs: Both(bka bil) Weakness of Arms/Hands: None  Permission Sought/Granted Permission sought to share information with : Family Supports, Chartered certified accountant granted to share information with : Yes, Verbal Permission Granted  Share Information with NAME: Tammie- daughter  Permission granted to share info w AGENCY: [facility]  Emotional Assessment Appearance:: Appears stated age Attitude/Demeanor/Rapport:  Engaged Affect (typically observed): Appropriate Orientation: : Oriented to Self, Oriented to Place, Oriented to  Time, Oriented to Situation Alcohol / Substance Use: Not Applicable Psych Involvement: No (comment)  Admission diagnosis:  Chronic diarrhea [K52.9] Elevated troponin [R77.8] Acute on chronic combined systolic and diastolic CHF (congestive heart failure) (HCC) [I50.43] Acute on chronic congestive heart failure, unspecified heart failure type [facility]) [I50.9] Patient Active Problem List   Diagnosis Date Noted  . Acute on chronic combined systolic and diastolic CHF (congestive heart failure) ([place]) 08/09/2019  . PAF (paroxysmal atrial fibrillation) ([place])   . Scrotal swelling   . Hypotension   . CHF exacerbation ([place]) 03/18/2019  . Acute on chronic systolic CHF (congestive heart failure) ([place]) 11/23/2018  . Troponin level elevated 11/23/2018  . Acute on chronic heart failure ([place])   . Atrial fibrillation and flutter ([place])   . Atrial fibrillation with RVR ([place]) 07/15/2018  . S/P AKA (above knee amputation) bilateral ([place])   . Pressure injury of skin 10/04/2017  . Hyperkalemia 11/17/2016  . Venous stasis ulcer ([place]) 11/17/2016  . Venous stasis ulcer of lower extremity ([place]) 05/23/2015  . Cellulitis of both lower extremities 10/18/2014  . Chronic renal insufficiency, stage 3 (moderate) ([place]) 10/18/2014  . HTN (hypertension), benign 10/18/2014  . Chronic diastolic CHF (congestive heart failure) ([place]) 10/18/2014  . Lower extremity cellulitis 10/18/2014  . PAD (peripheral artery disease) ([place]) 02/02/2014  . Atherosclerosis of native arteries of the extremities with ulceration ([place]) 07/22/2013  . Acute renal failure ([place]) 11/15/2012  . Fall 11/15/2012  . Diabetic ulcer of left foot ([place]) 10/15/2012  . Diabetes mellitus ([place]) 10/15/2012  . Lymphadenitis 08/07/2012  . Cellulitis 08/07/2012  . HTN (hypertension) 08/05/2012  . Renal insufficiency 08/05/2012  . Tobacco abuse  08/05/2012  . Gout 08/05/2012  . Hyperglycemia 08/05/2012   PCP:  [name], MD Pharmacy:   [facility], [place] [address] Phone: [phone] Fax: [phone]  [id number] - [place], [place] - [address] [place] [address] Phone: [phone] Fax: [phone]  [facility], [place], [place] [address], [place] [address] Phone: [phone] Fax: [place], [place] [address] [place] [address] Phone: [phone] Fax: [phone]   Readmission Risk Interventions No flowsheet data found.

## 2019-08-12 NOTE — Discharge Summary (Signed)
Physician Discharge Summary  [name] [name] GLO:[id number] DOB: 02/20/1927 DOA: 08/09/2019  PCP: [name], MD  Admit date: 08/09/2019 Discharge date: 08/12/2019  Admitted From: Home. Disposition: Home with home health RN  Recommendations for Outpatient Follow-up:  1. Follow up with PCP in 1-2 weeks, cardiology will schedule follow-up. 2. Please obtain BMP/CBC in one week  Home Health: RN  Equipment/Devices: wheelchair already present   Discharge Condition:Stable   CODE STATUS:full code  Diet recommendation: low salt   Discharge Summary: 86 year old male with history of congestive heart failure, systolic with known ejection fraction of 45%, hypertension, anasarca, diabetes, psoriasis, gout and hypertension who presented to the emergency room with shortness of breath for few days, diarrhea on and off for more than a month.  Patient also has paroxysmal A. fib and was also found with A. Fib.  In the emergency room, afebrile.  Blood pressure 136/97.  Heart rate 112.  77% on room air and 98% on 2 L.  proBNP 3000.  Troponins normal.  Creatinine 1.43.  COVID-19 negative.  Chest x-ray with cardiomegaly.  Patient was admitted to hospital with acute congestive heart failure.  Acute on chronic combined systolic and diastolic congestive heart failure, ischemic cardiomyopathy.  Previous known ejection fraction of 15%-improved to 45%-ejection fraction 25% on 4/14.  Diuresed well.  Responded well.  Currently on room air.  Followed by cardiology. With good improvement, discharging home on Lasix 40 mg twice a day, he is on Toprol-XL.  Unable to tolerate ACE inhibitor's because of low blood pressures.  Going home at CHF education, continue monitoring symptoms and outpatient cardiology follow-up.  His creatinine was slightly elevated today, will need close outpatient follow-up.  Patient also complained of diarrhea, however there was no evidence of diarrhea in the last 3 days of hospitalization.  Probably  functional.  Chronic atrial fibrillation: Rate controlled.  On Toprol-XL.  Therapeutic on Eliquis.  Peripheral vascular disease/extensive psoriatic lesions/chronic kidney disease: Chronic issues and remained stable.  Patient is able to go home today.  He has physical debility, he has bilateral above-knee amputation, wheelchair-bound.  He has developed acute CHF exacerbation.  He will benefit with skilled nursing at home.  Discharge Diagnoses:  Principal Problem:   Acute on chronic combined systolic and diastolic CHF (congestive heart failure) (HCC) Active Problems:   Tobacco abuse   Diabetes mellitus (HCC)   Chronic renal insufficiency, stage 3 (moderate) (HCC)   HTN (hypertension), benign   S/P AKA (above knee amputation) bilateral (HCC)   Acute on chronic heart failure (HCC)   Atrial fibrillation and flutter (HCC)   Troponin level elevated    Discharge Instructions  Discharge Instructions    Diet - low sodium heart healthy   Complete by: As directed    Increase activity slowly   Complete by: As directed      Allergies as of 08/12/2019   No Known Allergies     Medication List    TAKE these medications   Eliquis 5 MG Tabs tablet Generic drug: apixaban TAKE 1 TABLET BY MOUTH TWICE DAILY What changed: how much to take   fluocinolone 0.025 % cream Commonly known as: SYNALAR Apply topically 2 (two) times daily. To the scalp and sparingly to the face What changed: how much to take   furosemide 40 MG tablet Commonly known as: LASIX Take 1 tablet (40 mg total) by mouth 2 (two) times daily. Take 2 pills (40mg ) by mouth in the morning and 1 pill (20mg ) by mouth in the  evening. What changed:   how much to take  when to take this   gabapentin 100 MG capsule Commonly known as: NEURONTIN Take 1 capsule (100 mg total) by mouth See admin instructions. Take 200mg  in the morning, 200mg  every evening and 300mg  at bedtime.   hydrocortisone cream 1 % Apply topically 2 (two)  times daily.   metoprolol succinate 100 MG 24 hr tablet Commonly known as: TOPROL-XL Take 100 mg by mouth daily. Take with or immediately following a meal.   Potassium Chloride ER 20 MEQ Tbcr Take 20 mEq by mouth daily.   silver sulfADIAZINE 1 % cream Commonly known as: Silvadene Apply 1 application topically daily. Once daily to area of skin irritation on the right stump x 10 days then as needed What changed: when to take this   triamcinolone ointment 0.1 % Commonly known as: KENALOG Apply 1 application topically 2 (two) times daily as needed (skin irritation.).      Follow-up Information    Care, [facility] Follow up.   Specialty: Home Health Services Why: Registered Nurse- Office to call with visit time.  Contact information: [place] [address] [phone]          No Known Allergies  Consultations:  Cardiology   Procedures/Studies: DG Chest 2 View  Result Date: 08/09/2019 CLINICAL DATA:  Shortness of breath. EXAM: CHEST - 2 VIEW COMPARISON:  Two-view chest x-ray 03/18/2019 FINDINGS: Heart is enlarged. Mild edema is present. Bilateral pleural effusions and associated dependent airspace disease is evident. Upper lung fields are clear. Degenerative changes are again noted at both shoulders. IMPRESSION: 1. Cardiomegaly with mild edema and bilateral pleural effusions compatible with congestive heart failure. 2. Bibasilar airspace disease likely reflects atelectasis. Electronically Signed   By: San Morelle M.D.   On: 08/09/2019 15:39   ECHOCARDIOGRAM LIMITED  Result Date: 08/10/2019    ECHOCARDIOGRAM LIMITED REPORT   Patient Name:   [name] [name] Date of Exam: 08/10/2019 Medical Rec #:  [id number]   Height:       48.0 in Accession #:    [id number]  Weight:       156.3 lb Date of Birth:  10/12/1926    BSA:          1.430 m Patient Age:    58 years    BP:           127/87 mmHg Patient Gender: M           HR:           92 bpm. Exam  Location:  Inpatient Procedure: Limited Echo, Color Doppler, Cardiac Doppler and 3D Echo Indications:    O13.08 Acute diastolic (congestive) heart failure  History:        Patient has prior history of Echocardiogram examinations, most                 recent 03/23/2019. CHF, Arrythmias:Atrial Flutter and Atrial                 Fibrillation; Risk Factors:Diabetes and Hypertension.  Sonographer:    Raquel Sarna Senior RDCS Referring Phys: [id number] [place]  1. Left ventricular ejection fraction, by estimation, is 25 to 30%. The left ventricle has severely decreased function. The left ventricle demonstrates global hypokinesis. There is moderate akinesis of the left ventricular, entire anteroseptal wall.  2. The mitral valve is grossly normal. Mild mitral valve regurgitation.  3. Tricuspid valve regurgitation is mild to moderate.  4. The aortic valve is grossly normal. Aortic valve regurgitation is not visualized. No aortic stenosis is present.  5. There is moderately elevated pulmonary artery systolic pressure. FINDINGS  Left Ventricle: Left ventricular ejection fraction, by estimation, is 25 to 30%. The left ventricle has severely decreased function. The left ventricle demonstrates global hypokinesis. Moderate akinesis of the left ventricular, entire anteroseptal wall. Right Ventricle: There is moderately elevated pulmonary artery systolic pressure. The tricuspid regurgitant velocity is 3.23 m/s, and with an assumed right atrial pressure of 15 mmHg, the estimated right ventricular systolic pressure is 75.6 mmHg. Mitral Valve: The mitral valve is grossly normal. Mild mitral valve regurgitation. Tricuspid Valve: The tricuspid valve is grossly normal. Tricuspid valve regurgitation is mild to moderate. Aortic Valve: The aortic valve is grossly normal. Aortic valve regurgitation is not visualized. No aortic stenosis is present. There is mild calcification of the aortic valve.  RIGHT VENTRICLE RV S prime:     3.71  cm/s TAPSE (M-mode): 1.0 cm AORTIC VALVE LVOT Vmax:   52.20 cm/s LVOT Vmean:  30.600 cm/s LVOT VTI:    0.074 m TRICUSPID VALVE TR Peak grad:   41.7 mmHg TR Vmax:        323.00 cm/s  SHUNTS Systemic VTI: 0.07 m Mertie Moores MD Electronically signed by Mertie Moores MD Signature Date/Time: 08/10/2019/12:57:36 PM    Final      Subjective: Patient was seen and examined.  No overnight events.  He is poor historian.  Remains on room air.  Denies any shortness of breath or chest pain.   Discharge Exam: Vitals:   08/11/19 2343 08/12/19 0317  BP: 106/86 107/77  Pulse: 79 73  Resp:  15  Temp:  97.7 F (36.5 C)  SpO2:  100%   Vitals:   08/11/19 1522 08/11/19 2004 08/11/19 2343 08/12/19 0317  BP: 90/71 108/89 106/86 107/77  Pulse:  80 79 73  Resp:  17  15  Temp:  97.9 F (36.6 C)  97.7 F (36.5 C)  TempSrc:  Oral  Oral  SpO2:  98%  100%  Weight:        General: Pt is alert, awake, not in acute distress, comfortable on room air. Cardiovascular: Irregularly irregular  S1/S2 +, no rubs, no gallops Respiratory: CTA bilaterally, no wheezing, no rhonchi Abdominal: Soft, NT, ND, bowel sounds + Extremities: Bilateral above-knee amputation.  No appreciable edema. Extensive psoriatic lesions.    The results of significant diagnostics from this hospitalization (including imaging, microbiology, ancillary and laboratory) are listed below for reference.     Microbiology: Recent Results (from the past 240 hour(s))  SARS CORONAVIRUS 2 (TAT 6-24 HRS) Nasopharyngeal Nasopharyngeal Swab     Status: None   Collection Time: 08/09/19  6:03 PM   Specimen: Nasopharyngeal Swab  Result Value Ref Range Status   SARS Coronavirus 2 NEGATIVE NEGATIVE Final    Comment: (NOTE) SARS-CoV-2 target nucleic acids are NOT DETECTED. The SARS-CoV-2 RNA is generally detectable in upper and lower respiratory specimens during the acute phase of infection. Negative results do not preclude SARS-CoV-2 infection, do not  rule out co-infections with other pathogens, and should not be used as the sole basis for treatment or other patient management decisions. Negative results must be combined with clinical observations, patient history, and epidemiological information. The expected result is Negative. Fact Sheet for Patients: SugarRoll.be Fact Sheet for Healthcare Providers: https://www.woods-mathews.com/ This test is not yet approved or cleared by the [place] and  has been authorized  for detection and/or diagnosis of SARS-CoV-2 by FDA under an Emergency Use Authorization (EUA). This EUA will remain  in effect (meaning this test can be used) for the duration of the COVID-19 declaration under Section 56 4(b)(1) of the Act, 21 U.S.C. section 360bbb-3(b)(1), unless the authorization is terminated or revoked sooner. Performed at [facility], [place] [address] [place], [place] [address]      Labs: BNP (last 3 results) Recent Labs    03/18/19 1537 03/22/19 1430 08/09/19 1543  BNP 2,136.8* 1,887.8* 4,696.2*   Basic Metabolic Panel: Recent Labs  Lab 08/09/19 1530 08/10/19 0413 08/11/19 0637 08/12/19 0221  NA 140 141 140 139  K 3.6 3.2* 4.1 4.6  CL 99 100 98 98  CO2 26 29 28 29   GLUCOSE 149* 126* 87 105*  BUN 14 13 12 16   CREATININE 1.43* 1.29* 1.32* 1.44*  CALCIUM 9.4 8.9 8.8* 8.9  MG  --   --  1.9  --   PHOS  --   --  3.2  --    Liver Function Tests: Recent Labs  Lab 08/09/19 1530  AST 24  ALT 11  ALKPHOS 53  BILITOT 3.1*  PROT 7.2  ALBUMIN 3.0*   No results for input(s): LIPASE, AMYLASE in the last 168 hours. No results for input(s): AMMONIA in the last 168 hours. CBC: Recent Labs  Lab 08/09/19 1530 08/10/19 0413 08/11/19 0637  WBC 3.1* 2.5* 2.9*  NEUTROABS 2.0 1.4* 1.6*  HGB 14.7 14.7 14.7  HCT 45.6 44.6 44.4  MCV 100.2* 100.0 98.9  PLT 193 172 179   Cardiac Enzymes: No results for input(s): CKTOTAL, CKMB,  CKMBINDEX, TROPONINI in the last 168 hours. BNP: Invalid input(s): POCBNP CBG: Recent Labs  Lab 08/10/19 0825 08/10/19 1215 08/10/19 1831 08/10/19 2059 08/11/19 0811  GLUCAP 94 72 111* 90 79   D-Dimer No results for input(s): DDIMER in the last 72 hours. Hgb A1c Recent Labs    08/09/19 1949  HGBA1C 5.8*   Lipid Profile No results for input(s): CHOL, HDL, LDLCALC, TRIG, CHOLHDL, LDLDIRECT in the last 72 hours. Thyroid function studies No results for input(s): TSH, T4TOTAL, T3FREE, THYROIDAB in the last 72 hours.  Invalid input(s): FREET3 Anemia work up No results for input(s): VITAMINB12, FOLATE, FERRITIN, TIBC, IRON, RETICCTPCT in the last 72 hours. Urinalysis    Component Value Date/Time   COLORURINE YELLOW 03/18/2019 1834   APPEARANCEUR HAZY (A) 03/18/2019 1834   LABSPEC 1.006 03/18/2019 1834   PHURINE 6.0 03/18/2019 1834   GLUCOSEU NEGATIVE 03/18/2019 1834   HGBUR MODERATE (A) 03/18/2019 1834   BILIRUBINUR NEGATIVE 03/18/2019 [place] 03/18/2019 1834   PROTEINUR NEGATIVE 03/18/2019 1834   UROBILINOGEN 0.2 11/16/2012 0345   NITRITE NEGATIVE 03/18/2019 1834   LEUKOCYTESUR LARGE (A) 03/18/2019 1834   Sepsis Labs Invalid input(s): PROCALCITONIN,  WBC,  LACTICIDVEN Microbiology Recent Results (from the past 240 hour(s))  SARS CORONAVIRUS 2 (TAT 6-24 HRS) Nasopharyngeal Nasopharyngeal Swab     Status: None   Collection Time: 08/09/19  6:03 PM   Specimen: Nasopharyngeal Swab  Result Value Ref Range Status   SARS Coronavirus 2 NEGATIVE NEGATIVE Final    Comment: (NOTE) SARS-CoV-2 target nucleic acids are NOT DETECTED. The SARS-CoV-2 RNA is generally detectable in upper and lower respiratory specimens during the acute phase of infection. Negative results do not preclude SARS-CoV-2 infection, do not rule out co-infections with other pathogens, and should not be used as the sole basis for treatment  or other patient management decisions. Negative  results must be combined with clinical observations, patient history, and epidemiological information. The expected result is Negative. Fact Sheet for Patients: SugarRoll.be Fact Sheet for Healthcare Providers: https://www.woods-mathews.com/ This test is not yet approved or cleared by the [place] FDA and  has been authorized for detection and/or diagnosis of SARS-CoV-2 by FDA under an Emergency Use Authorization (EUA). This EUA will remain  in effect (meaning this test can be used) for the duration of the COVID-19 declaration under Section 56 4(b)(1) of the Act, 21 U.S.C. section 360bbb-3(b)(1), unless the authorization is terminated or revoked sooner. Performed at [facility], [place] [address] [place], [place] [address]      Time coordinating discharge:  32 minutes  SIGNED:   Barb Merino, MD  [facility] 08/12/2019, 12:13 PM

## 2019-08-15 ENCOUNTER — Telehealth: Payer: Self-pay

## 2019-08-15 NOTE — Telephone Encounter (Signed)
Transition Care Management Follow-up Telephone Call Date of discharge and from where: 08/12/2019, [facility].  Call placed to patient # [phone], message left with call back requested to this CM .  Patient has an appointment with Dr Chapman Fitch, 08/26/2019.

## 2019-08-16 ENCOUNTER — Telehealth: Payer: Self-pay | Admitting: Family Medicine

## 2019-08-16 ENCOUNTER — Telehealth: Payer: Self-pay

## 2019-08-16 NOTE — Telephone Encounter (Signed)
Transition Care Management Follow-up Telephone Call Attempt # 2 Date of discharge and from where: 08/12/2019, [facility].  Call placed to patient # [phone], message left with call back requested to this CM .  Patient has an appointment with Dr Chapman Fitch, 08/26/2019.

## 2019-08-16 NOTE — Telephone Encounter (Signed)
[name] [name] called and requested for verbal orders for nursing for 2xs a week for 2 weeks and 1x a week for 6 weeks. Please follow up at your earliest convenience. [name] [name] stated that it was okay to leave a detailed voicemail. [phone].

## 2019-08-17 ENCOUNTER — Telehealth: Payer: Self-pay

## 2019-08-17 NOTE — Telephone Encounter (Signed)
Opened in error

## 2019-08-17 NOTE — Telephone Encounter (Signed)
I called and spoke to [facility] on the phone. Gave verbal orders for nursing care.   Phill Myron, D.O. Primary Care at [facility]  08/17/2019, 8:15 AM

## 2019-08-17 NOTE — Telephone Encounter (Signed)
Transition Care Management Follow-up Telephone Call Date of discharge and from where: 08/12/2019, [facility]   Call placed to patient [phone], message left with call back requested to this CM.    Patient has appointment with PCP 08/26/2019

## 2019-08-18 NOTE — Telephone Encounter (Signed)
noted 

## 2019-08-26 ENCOUNTER — Other Ambulatory Visit: Payer: Self-pay

## 2019-08-26 ENCOUNTER — Encounter: Payer: Self-pay | Admitting: Family Medicine

## 2019-08-26 ENCOUNTER — Ambulatory Visit: Payer: Medicare Other | Attending: Family Medicine | Admitting: Family Medicine

## 2019-08-26 VITALS — BP 115/72 | HR 97 | Temp 97.6°F | Ht <= 58 in

## 2019-08-26 DIAGNOSIS — E1159 Type 2 diabetes mellitus with other circulatory complications: Secondary | ICD-10-CM

## 2019-08-26 DIAGNOSIS — Z89611 Acquired absence of right leg above knee: Secondary | ICD-10-CM | POA: Diagnosis not present

## 2019-08-26 DIAGNOSIS — Z89612 Acquired absence of left leg above knee: Secondary | ICD-10-CM

## 2019-08-26 DIAGNOSIS — Z09 Encounter for follow-up examination after completed treatment for conditions other than malignant neoplasm: Secondary | ICD-10-CM

## 2019-08-26 DIAGNOSIS — Z87891 Personal history of nicotine dependence: Secondary | ICD-10-CM | POA: Insufficient documentation

## 2019-08-26 DIAGNOSIS — I480 Paroxysmal atrial fibrillation: Secondary | ICD-10-CM | POA: Diagnosis not present

## 2019-08-26 DIAGNOSIS — N183 Chronic kidney disease, stage 3 unspecified: Secondary | ICD-10-CM | POA: Diagnosis not present

## 2019-08-26 DIAGNOSIS — D72819 Decreased white blood cell count, unspecified: Secondary | ICD-10-CM

## 2019-08-26 DIAGNOSIS — Z8249 Family history of ischemic heart disease and other diseases of the circulatory system: Secondary | ICD-10-CM | POA: Insufficient documentation

## 2019-08-26 DIAGNOSIS — I4892 Unspecified atrial flutter: Secondary | ICD-10-CM | POA: Diagnosis not present

## 2019-08-26 DIAGNOSIS — I739 Peripheral vascular disease, unspecified: Secondary | ICD-10-CM | POA: Diagnosis not present

## 2019-08-26 DIAGNOSIS — Z79899 Other long term (current) drug therapy: Secondary | ICD-10-CM | POA: Insufficient documentation

## 2019-08-26 DIAGNOSIS — E1122 Type 2 diabetes mellitus with diabetic chronic kidney disease: Secondary | ICD-10-CM | POA: Insufficient documentation

## 2019-08-26 DIAGNOSIS — I255 Ischemic cardiomyopathy: Secondary | ICD-10-CM | POA: Insufficient documentation

## 2019-08-26 DIAGNOSIS — I4891 Unspecified atrial fibrillation: Secondary | ICD-10-CM

## 2019-08-26 DIAGNOSIS — Z7901 Long term (current) use of anticoagulants: Secondary | ICD-10-CM | POA: Diagnosis not present

## 2019-08-26 DIAGNOSIS — Z993 Dependence on wheelchair: Secondary | ICD-10-CM | POA: Diagnosis not present

## 2019-08-26 DIAGNOSIS — E1151 Type 2 diabetes mellitus with diabetic peripheral angiopathy without gangrene: Secondary | ICD-10-CM | POA: Diagnosis not present

## 2019-08-26 DIAGNOSIS — I130 Hypertensive heart and chronic kidney disease with heart failure and stage 1 through stage 4 chronic kidney disease, or unspecified chronic kidney disease: Secondary | ICD-10-CM | POA: Insufficient documentation

## 2019-08-26 DIAGNOSIS — R1030 Lower abdominal pain, unspecified: Secondary | ICD-10-CM

## 2019-08-26 DIAGNOSIS — I5032 Chronic diastolic (congestive) heart failure: Secondary | ICD-10-CM | POA: Diagnosis present

## 2019-08-26 DIAGNOSIS — Z86718 Personal history of other venous thrombosis and embolism: Secondary | ICD-10-CM | POA: Diagnosis not present

## 2019-08-26 DIAGNOSIS — G546 Phantom limb syndrome with pain: Secondary | ICD-10-CM

## 2019-08-26 DIAGNOSIS — N2889 Other specified disorders of kidney and ureter: Secondary | ICD-10-CM

## 2019-08-26 DIAGNOSIS — M792 Neuralgia and neuritis, unspecified: Secondary | ICD-10-CM

## 2019-08-26 DIAGNOSIS — Z789 Other specified health status: Secondary | ICD-10-CM

## 2019-08-26 DIAGNOSIS — L409 Psoriasis, unspecified: Secondary | ICD-10-CM

## 2019-08-26 MED ORDER — FAMOTIDINE 20 MG PO TABS: 20.0000 mg | ORAL_TABLET | Freq: Two times a day (BID) | ORAL | 1 refills | Status: AC

## 2019-08-26 NOTE — Progress Notes (Signed)
Established Patient Office Visit  Subjective:  Patient ID: [name] [name], male    DOB: 07/24/1926  Age: 86 y.o. MRN: [id number]  CC: Hospital follow-up; recurrent abdominal pain  HPI [name] [name], 86 year old medically complex patient, who was seen in follow-up of hospitalization from 08/09/2019 through 08/12/2019 due to increasing shortness of breath and patient was found to have acute on chronic CHF exacerbation.  He also has hypertension, type 2 diabetes, ischemic cardiomyopathy, severe psoriasis, paroxysmal atrial fibrillation requiring long-term use of anticoagulant medication as well as peripheral vascular disease status post bilateral AKA's due to intractable pain from critical limb ischemia.  He reports that he feels much better status post hospitalization.  He reports less shortness of breath.  He has been taking his fluid pill/furosemide at home and has had no buildup of fluid.  He denies any headaches or dizziness related to his blood pressure.  He has had no chest pain and he does not have any sensation of palpitations or increased heart rate.  He denies any unusual bruising or bleeding.         He does have complaint of onset of abdominal pain and the need to defecate about 30 minutes after he eats anything.  He states that he mention this during his hospitalization but he does not feel it was addressed.  He states that he is not really able to look and see if he is having diarrhea but had his daughter check the commode earlier this week and she states that he did not have diarrhea.  Patient however feels as if he is having diarrhea when he has episodes of abdominal pain after eating.  He denies issues with nausea.  Past Medical History:  Diagnosis Date  . Chronic kidney disease    RENAL INSUFICCIENCY  . Diabetes mellitus without complication ([place])   . DVT (deep venous thrombosis) ([place])   . Gout   . Hypertension   . Lymphadenitis 08/10/2012  . Psoriasis     Past Surgical History:    Procedure Laterality Date  . AMPUTATION Bilateral 10/08/2017   Procedure: Bilateral  ABOVE KNEE amputations;  Surgeon: Conrad Deerfield, MD;  Location: [facility];  Service: Vascular;  Laterality: Bilateral;    Family History  Problem Relation Age of Onset  . Hypertension Mother   . Stroke Mother   . Hypertension Father     Social History   Socioeconomic History  . Marital status: Widowed    Spouse name: Not on file  . Number of children: Not on file  . Years of education: Not on file  . Highest education level: Not on file  Occupational History  . Occupation: Retired  Tobacco Use  . Smoking status: Former Smoker    Years: 40.00    Types: Pipe  . Smokeless tobacco: Never Used  Substance and Sexual Activity  . Alcohol use: No  . Drug use: No  . Sexual activity: Not on file  Other Topics Concern  . Not on file  Social History Narrative   He has a room in his daughter's house.   Social Determinants of Health   Financial Resource Strain:   . Difficulty of Paying Living Expenses:   Food Insecurity:   . Worried About Charity fundraiser in the Last Year:   . Arboriculturist in the Last Year:   Transportation Needs:   . Film/video editor (Medical):   Marland Kitchen Lack of Transportation (Non-Medical):   Physical Activity:   .  Days of Exercise per Week:   . Minutes of Exercise per Session:   Stress:   . Feeling of Stress :   Social Connections:   . Frequency of Communication with Friends and Family:   . Frequency of Social Gatherings with Friends and Family:   . Attends Religious Services:   . Active Member of Clubs or Organizations:   . Attends Archivist Meetings:   Marland Kitchen Marital Status:   Intimate Partner Violence:   . Fear of Current or Ex-Partner:   . Emotionally Abused:   Marland Kitchen Physically Abused:   . Sexually Abused:     Outpatient Medications Prior to Visit  Medication Sig Dispense Refill  . ELIQUIS 5 MG TABS tablet TAKE 1 TABLET BY MOUTH TWICE DAILY (Patient  taking differently: Take 5 mg by mouth 2 (two) times daily. ) 60 tablet 2  . fluocinolone (SYNALAR) 0.025 % cream Apply topically 2 (two) times daily. To the scalp and sparingly to the face (Patient taking differently: Apply 1 application topically 2 (two) times daily. To the scalp and sparingly to the face) 120 g prn  . furosemide (LASIX) 40 MG tablet Take 1 tablet (40 mg total) by mouth 2 (two) times daily. Take 2 pills (40mg ) by mouth in the morning and 1 pill (20mg ) by mouth in the evening. 30 tablet 5  . gabapentin (NEURONTIN) 100 MG capsule Take 1 capsule (100 mg total) by mouth See admin instructions. Take 200mg  in the morning, 200mg  every evening and 300mg  at bedtime. 210 capsule 0  . hydrocortisone cream 1 % Apply topically 2 (two) times daily. 30 g 1  . metoprolol succinate (TOPROL-XL) 100 MG 24 hr tablet Take 100 mg by mouth daily. Take with or immediately following a meal.    . Potassium Chloride ER 20 MEQ TBCR Take 20 mEq by mouth daily. 90 tablet 0  . silver sulfADIAZINE (SILVADENE) 1 % cream Apply 1 application topically daily. Once daily to area of skin irritation on the right stump x 10 days then as needed (Patient taking differently: Apply 1 application topically See admin instructions. Once daily to area of skin irritation on the right stump x 10 days then as needed) 50 g 6  . triamcinolone ointment (KENALOG) 0.1 % Apply 1 application topically 2 (two) times daily as needed (skin irritation.).      No facility-administered medications prior to visit.    No Known Allergies  ROS Review of Systems  Constitutional: Positive for fatigue (Improved). Negative for chills and fever.  HENT: Negative for sore throat and trouble swallowing.   Respiratory: Negative for cough and shortness of breath.   Cardiovascular: Negative for chest pain and palpitations.  Gastrointestinal: Positive for abdominal pain and diarrhea (Feels like he is having diarrhea). Negative for blood in stool and  constipation.  Endocrine: Positive for polyuria (Due to Lasix). Negative for polydipsia and polyphagia.  Genitourinary: Positive for frequency (Due to Lasix use per patient). Negative for dysuria.  Musculoskeletal: Negative for arthralgias and back pain (Sometimes due to sitting in wheelchair).  Skin: Positive for rash. Negative for wound.       Chronic but improved with current ointments  Neurological: Negative for dizziness and headaches.  Hematological: Negative for adenopathy. Does not bruise/bleed easily.  Psychiatric/Behavioral: Negative for self-injury and suicidal ideas. The patient is not nervous/anxious.       Objective:    Physical Exam  Constitutional: He is oriented to person, place, and time. He appears well-developed and  well-nourished.  Well-nourished well-developed older male sitting in motorized wheelchair.  He is in no acute distress and wearing mask as per office COVID-19 protocol.  He does appear thinner than at prior visits.  Neck: No JVD present.  Cardiovascular: Normal rate and regular rhythm.  Heart sounds are slightly distant but he appears to be in normal sinus rhythm  Pulmonary/Chest: Effort normal and breath sounds normal.  Abdominal: Soft. There is no abdominal tenderness. There is no rebound and no guarding.  Musculoskeletal:        General: No tenderness, deformity (Bilateral AKA's) or edema.     Cervical back: Normal range of motion and neck supple.     Comments: Patient does have slightly more edema/fullness in the left thigh/Doppler than on the right.  No tenderness and no active skin breakdown.  No CVA tenderness.  Neurological: He is alert and oriented to person, place, and time.  Skin: Skin is warm and dry. Rash (Psoriatic plaques on bilateral arms and patient with chronic skin changes to the scalp but does not have any presence of flaky skin at today's visit ) noted.  Psychiatric: He has a normal mood and affect. His behavior is normal.  Nursing  note and vitals reviewed.   BP 115/72   Pulse 97   Temp 97.6 F (36.4 C) (Temporal)   Ht 4' (1.219 m)   BMI 46.28 kg/m  Wt Readings from Last 3 Encounters:  08/11/19 151 lb 10.8 oz (68.8 kg)  03/28/19 162 lb (73.5 kg)  03/26/19 162 lb 14.7 oz (73.9 kg)     Health Maintenance Due  Topic Date Due  . OPHTHALMOLOGY EXAM  Never done  . URINE MICROALBUMIN  Never done  . COVID-19 Vaccine (1) Never done  . PNA vac Low Risk Adult (2 of 2 - PCV13) 08/12/2013    There are no preventive care reminders to display for this patient.  Lab Results  Component Value Date   TSH 3.091 07/15/2018   Lab Results  Component Value Date   WBC 3.6 08/26/2019   HGB 14.0 08/26/2019   HCT 41.4 08/26/2019   MCV 99 (H) 08/26/2019   PLT 199 08/26/2019   Lab Results  Component Value Date   NA 144 08/26/2019   K 4.5 08/26/2019   CO2 26 08/26/2019   GLUCOSE 75 08/26/2019   BUN 23 08/26/2019   CREATININE 1.36 (H) 08/26/2019   BILITOT 3.1 (H) 08/09/2019   ALKPHOS 53 08/09/2019   AST 24 08/09/2019   ALT 11 08/09/2019   PROT 7.2 08/09/2019   ALBUMIN 3.0 (L) 08/09/2019   CALCIUM 9.2 08/26/2019   ANIONGAP 12 08/12/2019   Lab Results  Component Value Date   CHOL 197 10/08/2018   Lab Results  Component Value Date   HDL 58 10/08/2018   Lab Results  Component Value Date   LDLCALC 118 (H) 10/08/2018   Lab Results  Component Value Date   TRIG 103 10/08/2018   Lab Results  Component Value Date   CHOLHDL 3.4 10/08/2018   Lab Results  Component Value Date   HGBA1C 5.8 (H) 08/09/2019      Assessment & Plan:  1. Chronic diastolic CHF (congestive heart failure) ([place]); medically complex patient 2. Hospital discharge follow-up Patient's hospital notes, labs and imaging reviewed and discussed with the patient at today's visit.  He does not have any rales on exam, no significant edema and no current issues with shortness of breath.  His CHF appears to be  stable at this time.  His main  complaint currently is recurrent abdominal pain after eating and sensation of having diarrhea but hospital notes indicate that he had the same complaint during hospitalization but did not have any actual diarrhea noted.  He will have CBC and BMP in follow-up of hospitalization and chronic long-term medical issues including long-term use of anticoagulant medicine due to his paroxysmal atrial fibrillation and leukopenia and BMP in follow-up of use of high-risk medications including furosemide for treatment of his CHF, stage III chronic kidney disease and type 2 diabetes. - CBC - BMP  3. Lower abdominal pain Patient with complaint of postprandial abdominal pain within 30 minutes of eating.  He also reports sensation of having diarrhea but on review of hospital records, he had this complaint during his hospitalization as well but was not noted to have any episodes of diarrhea.  He does have history of peripheral arterial disease so there could be some issues with bowel ischemia potentially contributing to abdominal pain.  I am unsure of the etiology of patient's complaints and this is troubling to the patient and he will be referred to gastroenterology for further evaluation and treatment. - Basic Metabolic Panel - CBC - Ambulatory referral to Gastroenterology  4. PAD (peripheral artery disease) ([place]); status post bilateral AKA; wheelchair dependent; neuropathic pain/phantom limb syndrome Patient is status post bilateral AKA secondary to peripheral arterial disease.  He will continue use of gabapentin for neuropathic pain as well as Eliquis for blood flow.  5.  Paroxysmal atrial fibrillation and flutter ([place]) Patient appears to be in sinus rhythm at today's visit with controlled rate.  Continue current medications and anticoagulant medication/Eliquis.  CBC in follow-up of Eliquis use. - CBC  6. Chronic renal insufficiency, stage 3 (moderate) We will check electrolytes and creatinine due to patient's  chronic renal insufficiency as patient with most recent creatinine of 1.44 during hospitalization.   - BMP  7. Long term current use of anticoagulant therapy; 8.  Leukopenia We will check CBC in follow-up of his long-term use of anticoagulant therapy secondary to atrial fibrillation to help decrease risk of embolic CVA.  Patient also with known leukopenia with white blood cell count during recent hospitalization of 2.9 on 08/11/2019 but no presence of anemia.  He is also provided with prescription to pharmacy for Pepcid 20 mg twice daily as he has some complaints of abdominal pain after eating and also for ulcer prevention due to long-term use of anticoagulant medication - CBC - famotidine (PEPCID) 20 MG tablet; Take 1 tablet (20 mg total) by mouth 2 (two) times daily. To reduce stomach acid  Dispense: 180 tablet; Refill: 1   Meds ordered this encounter  Medications  . famotidine (PEPCID) 20 MG tablet    Sig: Take 1 tablet (20 mg total) by mouth 2 (two) times daily. To reduce stomach acid    Dispense:  180 tablet    Refill:  1    An After Visit Summary was printed and given to the patient.    Follow-up: Return in about 10 weeks (around 11/04/2019) for chronic issues- sooner if needed; call next week if not contacted by gastroenterology.   Approximately 30 minutes of face-to-face time spent with the patient at today's visit obtaining information regarding his recent hospitalization and current medical issues, performing physical examination and formulating assessment and treatment plan which was discussed with the patient and all questions answered.  Additional time of more than 15 minutes was spent on review  of chart and medical records, placement of orders/referrals and completion of today's visit note.  [name], MD

## 2019-08-26 NOTE — Progress Notes (Signed)
TCM and CHF f/u

## 2019-08-27 LAB — CBC
Hematocrit: 41.4 % (ref 37.5–51.0)
Hemoglobin: 14 g/dL (ref 13.0–17.7)
MCH: 33.5 pg — ABNORMAL HIGH (ref 26.6–33.0)
MCHC: 33.8 g/dL (ref 31.5–35.7)
MCV: 99 fL — ABNORMAL HIGH (ref 79–97)
Platelets: 199 x10E3/uL (ref 150–450)
RBC: 4.18 x10E6/uL (ref 4.14–5.80)
RDW: 14.6 % (ref 11.6–15.4)
WBC: 3.6 x10E3/uL (ref 3.4–10.8)

## 2019-08-27 LAB — BASIC METABOLIC PANEL WITH GFR
BUN/Creatinine Ratio: 17 (ref 10–24)
BUN: 23 mg/dL (ref 10–36)
CO2: 26 mmol/L (ref 20–29)
Calcium: 9.2 mg/dL (ref 8.6–10.2)
Chloride: 100 mmol/L (ref 96–106)
Creatinine, Ser: 1.36 mg/dL — ABNORMAL HIGH (ref 0.76–1.27)
GFR calc Af Amer: 53 mL/min/1.73 — ABNORMAL LOW
GFR calc non Af Amer: 45 mL/min/1.73 — ABNORMAL LOW
Glucose: 75 mg/dL (ref 65–99)
Potassium: 4.5 mmol/L (ref 3.5–5.2)
Sodium: 144 mmol/L (ref 134–144)

## 2019-09-01 ENCOUNTER — Telehealth: Payer: Self-pay

## 2019-09-01 NOTE — Telephone Encounter (Signed)
CMN for incontinence supplies faxed to [facility]

## 2019-09-05 ENCOUNTER — Ambulatory Visit: Payer: Medicare Other | Admitting: Medical

## 2019-09-07 ENCOUNTER — Telehealth: Payer: Self-pay | Admitting: Cardiovascular Disease

## 2019-09-07 NOTE — Telephone Encounter (Signed)
  Patient c/o Palpitations:  High priority if patient c/o lightheadedness, shortness of breath, or chest pain  1) How long have you had palpitations/irregular HR/ Afib? Are you having the symptoms now? Yes, 2 or 3 days  2) Are you currently experiencing lightheadedness, SOB or CP? Was SOB early  3) Do you have a history of afib (atrial fibrillation) or irregular heart rhythm? afib  4) Have you checked your BP or HR? (document readings if available): 132/90, HR highest 140 lowest 90's  5) Are you experiencing any other symptoms? Swelling in scrotal region   Beth from [facility] calling with the patient. She states the patient's HR is irregular and the patient is in afib. She states his highest HR today was 140 and the lowest was in the 90's. She states he has been SOB, but is not currently. She states he also has some swelling in the scrotal region. Patient has an appointment 09/22/2019.

## 2019-09-07 NOTE — Telephone Encounter (Signed)
Spoke with [employer] from [facility]. She is with the patient. Patient has shortness of breath and scrotal edema. Beth reports patient has been "panting at times." She reports his heart rate has been between 80 and 140, heart rate is fluctuating. Beth states they have an appointment coming up with Roby Lofts. Advised per protocol with patient being symptomatic patient should call EMS. Beth states "it's not sustained", informed Beth per protocol with patient being in a-fib with heart rate going as high as the 140s and being short of breath with edema patient should report to the ER for evaluation. Beth reports she will let patient's caregiver know.

## 2019-09-08 ENCOUNTER — Emergency Department (HOSPITAL_COMMUNITY): Payer: Medicare Other

## 2019-09-08 ENCOUNTER — Other Ambulatory Visit: Payer: Self-pay

## 2019-09-08 ENCOUNTER — Inpatient Hospital Stay (HOSPITAL_COMMUNITY)
Admission: EM | Admit: 2019-09-08 | Discharge: 2019-09-12 | DRG: 291 | Disposition: A | Discharge status: Home Health | Payer: Medicare Other | Attending: Student | Admitting: Student

## 2019-09-08 ENCOUNTER — Encounter (HOSPITAL_COMMUNITY): Payer: Self-pay | Admitting: Pediatrics

## 2019-09-08 DIAGNOSIS — Z7901 Long term (current) use of anticoagulants: Secondary | ICD-10-CM | POA: Diagnosis not present

## 2019-09-08 DIAGNOSIS — I509 Heart failure, unspecified: Secondary | ICD-10-CM

## 2019-09-08 DIAGNOSIS — R197 Diarrhea, unspecified: Secondary | ICD-10-CM | POA: Diagnosis present

## 2019-09-08 DIAGNOSIS — L89890 Pressure ulcer of other site, unstageable: Secondary | ICD-10-CM | POA: Diagnosis not present

## 2019-09-08 DIAGNOSIS — E1122 Type 2 diabetes mellitus with diabetic chronic kidney disease: Secondary | ICD-10-CM | POA: Diagnosis present

## 2019-09-08 DIAGNOSIS — I10 Essential (primary) hypertension: Secondary | ICD-10-CM | POA: Diagnosis not present

## 2019-09-08 DIAGNOSIS — R5381 Other malaise: Secondary | ICD-10-CM | POA: Diagnosis not present

## 2019-09-08 DIAGNOSIS — I130 Hypertensive heart and chronic kidney disease with heart failure and stage 1 through stage 4 chronic kidney disease, or unspecified chronic kidney disease: Principal | ICD-10-CM | POA: Diagnosis present

## 2019-09-08 DIAGNOSIS — N1832 Chronic kidney disease, stage 3b: Secondary | ICD-10-CM | POA: Diagnosis present

## 2019-09-08 DIAGNOSIS — I5033 Acute on chronic diastolic (congestive) heart failure: Secondary | ICD-10-CM | POA: Diagnosis present

## 2019-09-08 DIAGNOSIS — R911 Solitary pulmonary nodule: Secondary | ICD-10-CM | POA: Diagnosis present

## 2019-09-08 DIAGNOSIS — E1151 Type 2 diabetes mellitus with diabetic peripheral angiopathy without gangrene: Secondary | ICD-10-CM | POA: Diagnosis present

## 2019-09-08 DIAGNOSIS — I959 Hypotension, unspecified: Secondary | ICD-10-CM | POA: Diagnosis present

## 2019-09-08 DIAGNOSIS — Z993 Dependence on wheelchair: Secondary | ICD-10-CM

## 2019-09-08 DIAGNOSIS — I4821 Permanent atrial fibrillation: Secondary | ICD-10-CM | POA: Diagnosis present

## 2019-09-08 DIAGNOSIS — R140 Abdominal distension (gaseous): Secondary | ICD-10-CM | POA: Diagnosis present

## 2019-09-08 DIAGNOSIS — Z823 Family history of stroke: Secondary | ICD-10-CM

## 2019-09-08 DIAGNOSIS — Z87891 Personal history of nicotine dependence: Secondary | ICD-10-CM | POA: Diagnosis not present

## 2019-09-08 DIAGNOSIS — Z66 Do not resuscitate: Secondary | ICD-10-CM | POA: Diagnosis present

## 2019-09-08 DIAGNOSIS — Z20822 Contact with and (suspected) exposure to covid-19: Secondary | ICD-10-CM | POA: Diagnosis present

## 2019-09-08 DIAGNOSIS — L89311 Pressure ulcer of right buttock, stage 1: Secondary | ICD-10-CM | POA: Diagnosis present

## 2019-09-08 DIAGNOSIS — Z89612 Acquired absence of left leg above knee: Secondary | ICD-10-CM

## 2019-09-08 DIAGNOSIS — L89321 Pressure ulcer of left buttock, stage 1: Secondary | ICD-10-CM | POA: Diagnosis present

## 2019-09-08 DIAGNOSIS — R0602 Shortness of breath: Secondary | ICD-10-CM

## 2019-09-08 DIAGNOSIS — L409 Psoriasis, unspecified: Secondary | ICD-10-CM | POA: Diagnosis present

## 2019-09-08 DIAGNOSIS — Z89611 Acquired absence of right leg above knee: Secondary | ICD-10-CM | POA: Diagnosis not present

## 2019-09-08 DIAGNOSIS — I480 Paroxysmal atrial fibrillation: Secondary | ICD-10-CM | POA: Diagnosis not present

## 2019-09-08 DIAGNOSIS — Z8249 Family history of ischemic heart disease and other diseases of the circulatory system: Secondary | ICD-10-CM | POA: Diagnosis not present

## 2019-09-08 DIAGNOSIS — Z79899 Other long term (current) drug therapy: Secondary | ICD-10-CM

## 2019-09-08 DIAGNOSIS — Z789 Other specified health status: Secondary | ICD-10-CM | POA: Diagnosis not present

## 2019-09-08 DIAGNOSIS — M109 Gout, unspecified: Secondary | ICD-10-CM | POA: Diagnosis present

## 2019-09-08 DIAGNOSIS — N289 Disorder of kidney and ureter, unspecified: Secondary | ICD-10-CM

## 2019-09-08 DIAGNOSIS — L8990 Pressure ulcer of unspecified site, unspecified stage: Secondary | ICD-10-CM | POA: Diagnosis present

## 2019-09-08 DIAGNOSIS — I5023 Acute on chronic systolic (congestive) heart failure: Secondary | ICD-10-CM | POA: Diagnosis present

## 2019-09-08 DIAGNOSIS — D72819 Decreased white blood cell count, unspecified: Secondary | ICD-10-CM | POA: Diagnosis present

## 2019-09-08 LAB — SARS CORONAVIRUS 2 BY RT PCR (HOSPITAL ORDER, PERFORMED IN ~~LOC~~ HOSPITAL LAB): SARS Coronavirus 2: NEGATIVE

## 2019-09-08 LAB — COMPREHENSIVE METABOLIC PANEL
ALT: 18 U/L (ref 0–44)
AST: 30 U/L (ref 15–41)
Albumin: 2.9 g/dL — ABNORMAL LOW (ref 3.5–5.0)
Alkaline Phosphatase: 80 U/L (ref 38–126)
Anion gap: 11 (ref 5–15)
BUN: 21 mg/dL (ref 8–23)
CO2: 25 mmol/L (ref 22–32)
Calcium: 9.1 mg/dL (ref 8.9–10.3)
Chloride: 103 mmol/L (ref 98–111)
Creatinine, Ser: 1.48 mg/dL — ABNORMAL HIGH (ref 0.61–1.24)
GFR calc Af Amer: 48 mL/min — ABNORMAL LOW (ref >60)
GFR calc non Af Amer: 41 mL/min — ABNORMAL LOW (ref >60)
Glucose, Bld: 99 mg/dL (ref 70–99)
Potassium: 4.7 mmol/L (ref 3.5–5.1)
Sodium: 139 mmol/L (ref 135–145)
Total Bilirubin: 1.5 mg/dL — ABNORMAL HIGH (ref 0.3–1.2)
Total Protein: 6.8 g/dL (ref 6.5–8.1)

## 2019-09-08 LAB — CBC WITH DIFFERENTIAL/PLATELET
Abs Immature Granulocytes: 0.01 10*3/uL (ref 0.00–0.07)
Basophils Absolute: 0 10*3/uL (ref 0.0–0.1)
Basophils Relative: 0 %
Eosinophils Absolute: 0.1 10*3/uL (ref 0.0–0.5)
Eosinophils Relative: 2 %
HCT: 40.9 % (ref 39.0–52.0)
Hemoglobin: 12.9 g/dL — ABNORMAL LOW (ref 13.0–17.0)
Immature Granulocytes: 0 %
Lymphocytes Relative: 18 %
Lymphs Abs: 0.6 10*3/uL — ABNORMAL LOW (ref 0.7–4.0)
MCH: 33.1 pg (ref 26.0–34.0)
MCHC: 31.5 g/dL (ref 30.0–36.0)
MCV: 104.9 fL — ABNORMAL HIGH (ref 80.0–100.0)
Monocytes Absolute: 0.4 10*3/uL (ref 0.1–1.0)
Monocytes Relative: 13 %
Neutro Abs: 2.1 10*3/uL (ref 1.7–7.7)
Neutrophils Relative %: 67 %
Platelets: 202 10*3/uL (ref 150–400)
RBC: 3.9 MIL/uL — ABNORMAL LOW (ref 4.22–5.81)
RDW: 16.5 % — ABNORMAL HIGH (ref 11.5–15.5)
WBC: 3.2 10*3/uL — ABNORMAL LOW (ref 4.0–10.5)
nRBC: 0 % (ref 0.0–0.2)

## 2019-09-08 LAB — BRAIN NATRIURETIC PEPTIDE: B Natriuretic Peptide: 2050.1 pg/mL — ABNORMAL HIGH (ref 0.0–100.0)

## 2019-09-08 MED ORDER — TRIAMCINOLONE ACETONIDE 0.1 % EX CREA
TOPICAL_CREAM | Freq: Two times a day (BID) | CUTANEOUS | Status: DC
  Filled 2019-09-08 (×2): qty 15

## 2019-09-08 MED ORDER — IOHEXOL 300 MG/ML  SOLN
100.0000 mL | Freq: Once | INTRAMUSCULAR | Status: AC | PRN
  Administered 2019-09-08: 100 mL via INTRAVENOUS

## 2019-09-08 MED ORDER — FUROSEMIDE 10 MG/ML IJ SOLN
80.0000 mg | Freq: Once | INTRAMUSCULAR | Status: AC
  Administered 2019-09-08: 80 mg via INTRAVENOUS
  Filled 2019-09-08: qty 8

## 2019-09-08 MED ORDER — ONDANSETRON HCL 4 MG/2ML IJ SOLN: 4.0000 mg | Freq: Four times a day (QID) | INTRAMUSCULAR | Status: DC | PRN

## 2019-09-08 MED ORDER — GABAPENTIN 100 MG PO CAPS
200.0000 mg | ORAL_CAPSULE | Freq: Three times a day (TID) | ORAL | Status: DC
  Administered 2019-09-08 – 2019-09-12 (×11): 200 mg via ORAL
  Filled 2019-09-08 (×11): qty 2

## 2019-09-08 MED ORDER — APIXABAN 5 MG PO TABS
5.0000 mg | ORAL_TABLET | Freq: Two times a day (BID) | ORAL | Status: DC
  Administered 2019-09-08 – 2019-09-12 (×8): 5 mg via ORAL
  Filled 2019-09-08 (×8): qty 1

## 2019-09-08 MED ORDER — FUROSEMIDE 10 MG/ML IJ SOLN
40.0000 mg | Freq: Two times a day (BID) | INTRAMUSCULAR | Status: DC
  Administered 2019-09-08 – 2019-09-09 (×3): 40 mg via INTRAVENOUS
  Filled 2019-09-08 (×5): qty 4

## 2019-09-08 MED ORDER — SODIUM CHLORIDE 0.9% FLUSH: 3.0000 mL | INTRAVENOUS | Status: DC | PRN

## 2019-09-08 MED ORDER — SODIUM CHLORIDE 0.9% FLUSH
3.0000 mL | Freq: Two times a day (BID) | INTRAVENOUS | Status: DC
  Administered 2019-09-08 – 2019-09-12 (×8): 3 mL via INTRAVENOUS

## 2019-09-08 MED ORDER — POTASSIUM CHLORIDE CRYS ER 20 MEQ PO TBCR
40.0000 meq | EXTENDED_RELEASE_TABLET | Freq: Every day | ORAL | Status: DC
  Administered 2019-09-09 – 2019-09-12 (×3): 40 meq via ORAL
  Filled 2019-09-08 (×5): qty 2

## 2019-09-08 MED ORDER — FLUOCINOLONE ACETONIDE 0.025 % EX CREA: 1.0000 "application " | TOPICAL_CREAM | Freq: Two times a day (BID) | CUTANEOUS | Status: DC

## 2019-09-08 MED ORDER — SODIUM CHLORIDE 0.9 % IV SOLN: 250.0000 mL | INTRAVENOUS | Status: DC | PRN

## 2019-09-08 MED ORDER — ACETAMINOPHEN 325 MG PO TABS: 650.0000 mg | ORAL_TABLET | ORAL | Status: DC | PRN

## 2019-09-08 MED ORDER — FAMOTIDINE 20 MG PO TABS
20.0000 mg | ORAL_TABLET | Freq: Every day | ORAL | Status: DC
  Administered 2019-09-09 – 2019-09-12 (×4): 20 mg via ORAL
  Filled 2019-09-08 (×4): qty 1

## 2019-09-08 NOTE — H&P (Addendum)
HISTORY AND PHYSICAL       PATIENT DETAILS Name: [name] [name] Age: 86 y.o. Sex: male Date of Birth: August 01, 1926 Admit Date: 09/08/2019 ZOX:WRUE, Cammie, MD   Patient coming from: Home   CHIEF COMPLAINT:  Shortness of breath-3-4 days  HPI: [name] [name] is a 86 y.o. male with medical history significant of chronic combined systolic heart failure (EF 25-30% by TTE on 08/10/2019), HTN, psoriasis, chronic atrial fibrillation on Eliquis, CKD stage IIIb, bilateral AKA-who presented to the hospital for evaluation of the above-noted complaints.  Note-patient is a poor historian-although he is able to give history-most of his history is obtained from the patient's daughter at bedside.  Apparently for the past 3 to 4 days-patient has become more short of breath than usual.  Daughter has noted that patient wakes up repeatedly in the middle of the night short of breath and feels better after sitting up.  He has noted more swelling in his bilateral AKA stumps and also his scrotum.  A few days back-home health RN noted that his O2 saturations were on the lower side but were fluctuating.  As result-patient was brought to the ED today-where he was found to have decompensated heart failure-the hospitalist service was asked to admit this patient for further evaluation and treatment.  Patient does also acknowledge some loose with soft stools-to 3 times a day-but daughter does acknowledge that stools are not watery-just soft and loose.  There is no history of abdominal pain or fever.  No nausea vomiting.  ED Course: In the emergency room-patient was found to have chest x-ray consistent with CHF-BNP was elevated-he was given 80 mg of IV Lasix, the hospitalist service was then consulted for inpatient admission.  COVID-19 PCR currently pending-however symptoms are more suggestive of decompensated heart failure.  Note: Lives at: Home Mobility: Bedbound/Wheelchair Chronic Indwelling Foley:no   REVIEW  OF SYSTEMS:  Constitutional:   No  weight loss, night sweats,  Fevers, chills, fatigue.  HEENT:    No headaches, Dysphagia,Tooth/dental problems,Sore throat,  No sneezing, itching, ear ache, nasal congestion, post nasal drip  Cardio-vascular: No chest pain palpitations  GI:  No heartburn, indigestion, abdominal pain, nausea, vomiting, diarrhea, melena or hematochezia  Resp: No hemoptysis,plueritic chest pain.   Skin:  No rash or lesions.  GU:  No dysuria, change in color of urine, no urgency or frequency.  No flank pain.  Musculoskeletal: No joint pain or swelling.  No decreased range of motion.  No back pain.  Endocrine: No heat intolerance, no cold intolerance, no polyuria, no polydipsia  Psych: No change in mood or affect. No depression or anxiety.  No memory loss.   ALLERGIES:  No Known Allergies  PAST MEDICAL HISTORY: Past Medical History:  Diagnosis Date  . Chronic kidney disease    RENAL INSUFICCIENCY  . Diabetes mellitus without complication ([place])   . DVT (deep venous thrombosis) ([place])   . Gout   . Hypertension   . Lymphadenitis 08/10/2012  . Psoriasis     PAST SURGICAL HISTORY: Past Surgical History:  Procedure Laterality Date  . AMPUTATION Bilateral 10/08/2017   Procedure: Bilateral  ABOVE KNEE amputations;  Surgeon: Conrad [place], MD;  Location: [place];  Service: Vascular;  Laterality: Bilateral;    MEDICATIONS AT HOME: Prior to Admission medications   Medication Sig Start Date End Date Taking? Authorizing Provider  ELIQUIS 5 MG TABS tablet TAKE 1 TABLET BY MOUTH TWICE DAILY Patient taking differently: Take 5  mg by mouth 2 (two) times daily.  07/25/19   Fulp, Cammie, MD  famotidine (PEPCID) 20 MG tablet Take 1 tablet (20 mg total) by mouth 2 (two) times daily. To reduce stomach acid 08/26/19   Fulp, Cammie, MD  fluocinolone (SYNALAR) 0.025 % cream Apply topically 2 (two) times daily. To the scalp and sparingly to the face Patient taking  differently: Apply 1 application topically 2 (two) times daily. To the scalp and sparingly to the face 05/27/19   Fulp, Cammie, MD  furosemide (LASIX) 40 MG tablet Take 1 tablet (40 mg total) by mouth 2 (two) times daily. Take 2 pills (40mg ) by mouth in the morning and 1 pill (20mg ) by mouth in the evening. 08/12/19   Barb Merino, MD  gabapentin (NEURONTIN) 100 MG capsule Take 1 capsule (100 mg total) by mouth See admin instructions. Take 200mg  in the morning, 200mg  every evening and 300mg  at bedtime. 08/12/19 09/11/19  Barb Merino, MD  hydrocortisone cream 1 % Apply topically 2 (two) times daily. 11/25/18   Shelly Coss, MD  metoprolol succinate (TOPROL-XL) 100 MG 24 hr tablet Take 100 mg by mouth daily. Take with or immediately following a meal.    [provider]  Potassium Chloride ER 20 MEQ TBCR Take 20 mEq by mouth daily. 08/12/19 09/11/19  Barb Merino, MD  silver sulfADIAZINE (SILVADENE) 1 % cream Apply 1 application topically daily. Once daily to area of skin irritation on the right stump x 10 days then as needed Patient taking differently: Apply 1 application topically See admin instructions. Once daily to area of skin irritation on the right stump x 10 days then as needed 05/27/19   Fulp, Cammie, MD  triamcinolone ointment (KENALOG) 0.1 % Apply 1 application topically 2 (two) times daily as needed (skin irritation.).  07/27/19   [provider]    FAMILY HISTORY: Family History  Problem Relation Age of Onset  . Hypertension Mother   . Stroke Mother   . Hypertension Father       SOCIAL HISTORY:  reports that he has quit smoking. His smoking use included pipe. He quit after 40.00 years of use. He has never used smokeless tobacco. He reports that he does not drink alcohol or use drugs.  PHYSICAL EXAM: Blood pressure (!) 125/98, pulse (!) 106, temperature 97.8 F (36.6 C), temperature source Oral, resp. rate 20, SpO2 99 %.  General appearance :Awake, alert, not  in any distress.  HEENT: Atraumatic and Normocephalic Neck: supple, + JVD.  Resp:Good air entry bilaterally, bibasilar rales CVS: S1 S2 irregular, no murmurs.  GI: Bowel sounds present, Non tender and not distended with no gaurding, rigidity or rebound. Extremities: Bilateral AKA Neurology: Nonfocal Musculoskeletal:No digital cyanosis Skin:No Rash, warm and dry Wounds:N/A  LABS ON ADMISSION:  I have personally reviewed following labs and imaging studies  CBC: Recent Labs  Lab 09/08/19 1143  WBC 3.2*  NEUTROABS 2.1  HGB 12.9*  HCT 40.9  MCV 104.9*  PLT 469    Basic Metabolic Panel: Recent Labs  Lab 09/08/19 1143  NA 139  K 4.7  CL 103  CO2 25  GLUCOSE 99  BUN 21  CREATININE 1.48*  CALCIUM 9.1    GFR: CrCl cannot be calculated (Unknown ideal weight.).  Liver Function Tests: Recent Labs  Lab 09/08/19 1143  AST 30  ALT 18  ALKPHOS 80  BILITOT 1.5*  PROT 6.8  ALBUMIN 2.9*   No results for input(s): LIPASE, AMYLASE in the last 168  hours. No results for input(s): AMMONIA in the last 168 hours.  Coagulation Profile: No results for input(s): INR, PROTIME in the last 168 hours.  Cardiac Enzymes: No results for input(s): CKTOTAL, CKMB, CKMBINDEX, TROPONINI in the last 168 hours.  BNP (last 3 results) No results for input(s): PROBNP in the last 8760 hours.  HbA1C: No results for input(s): HGBA1C in the last 72 hours.  CBG: No results for input(s): GLUCAP in the last 168 hours.  Lipid Profile: No results for input(s): CHOL, HDL, LDLCALC, TRIG, CHOLHDL, LDLDIRECT in the last 72 hours.  Thyroid Function Tests: No results for input(s): TSH, T4TOTAL, FREET4, T3FREE, THYROIDAB in the last 72 hours.  Anemia Panel: No results for input(s): VITAMINB12, FOLATE, FERRITIN, TIBC, IRON, RETICCTPCT in the last 72 hours.  Urine analysis:    Component Value Date/Time   COLORURINE YELLOW 03/18/2019 1834   APPEARANCEUR HAZY (A) 03/18/2019 1834   LABSPEC 1.006  03/18/2019 1834   PHURINE 6.0 03/18/2019 1834   GLUCOSEU NEGATIVE 03/18/2019 1834   HGBUR MODERATE (A) 03/18/2019 1834   BILIRUBINUR NEGATIVE 03/18/2019 1834   KETONESUR NEGATIVE 03/18/2019 1834   PROTEINUR NEGATIVE 03/18/2019 1834   UROBILINOGEN 0.2 11/16/2012 0345   NITRITE NEGATIVE 03/18/2019 1834   LEUKOCYTESUR LARGE (A) 03/18/2019 1834    Sepsis Labs: Lactic Acid, Venous    Component Value Date/Time   LATICACIDVEN 1.8 10/05/2017 0434     Microbiology: No results found for this or any previous visit (from the past 240 hour(s)).    RADIOLOGIC STUDIES ON ADMISSION: DG Chest 2 View  Result Date: 09/08/2019 CLINICAL DATA:  86 year old male with history of shortness of breath for 1 week. EXAM: CHEST - 2 VIEW COMPARISON:  Chest x-ray 08/09/2019. FINDINGS: Bibasilar opacities (left greater than right) which may reflect areas of atelectasis and/or consolidation, although this is likely accentuated by under penetration of the image. Small left pleural effusion. No right pleural effusion. There is cephalization of the pulmonary vasculature and slight indistinctness of the interstitial markings suggestive of mild pulmonary edema. Mild cardiomegaly. Upper mediastinal contours are distorted by patient positioning. IMPRESSION: 1. The appearance the chest suggests congestive heart failure, as above. Electronically Signed   By: Vinnie Langton M.D.   On: 09/08/2019 11:21   CT Abdomen Pelvis W Contrast  Result Date: 09/08/2019 CLINICAL DATA:  Diverticulitis suspected, shortness of breath EXAM: CT ABDOMEN AND PELVIS WITH CONTRAST TECHNIQUE: Multidetector CT imaging of the abdomen and pelvis was performed using the standard protocol following bolus administration of intravenous contrast. CONTRAST:  152mL OMNIPAQUE IOHEXOL 300 MG/ML  SOLN COMPARISON:  Renal ultrasound 08/07/2012 FINDINGS: Lower chest: Moderate bilateral pleural effusions with adjacent areas of atelectasis. Some associated basilar  airways thickening is noted as well as patchy ground-glass in the right middle lobe. Additional 4 mm solid nodule favored to be infectious or inflammatory seen in the right middle lobe. Cardiomegaly without pericardial effusion. Three-vessel coronary artery disease is noted. Hepatobiliary: Diffuse hepatic hypoattenuation compatible with hepatic steatosis. No focal liver lesion. Smooth liver surface contour. Hyperdense material layering towards the gallbladder neck may reflect some calcified biliary sand/gallstones or biliary sludge. No visible intraductal gallstones. No biliary ductal dilatation. Pancreas: Unremarkable. No pancreatic ductal dilatation or surrounding inflammatory changes. Spleen: Slightly heterogeneous enhancement of the spleen likely related to the timing of contrast media resulting in a late arterial phase. Splenic enhancement normalize on delayed imaging. No concerning splenic lesions. Normal splenic size. Adrenals/Urinary Tract: Lobular thickening of the adrenal glands without concerning dominant nodule  may reflect some senescent adrenal hyperplasia. Kidneys enhance and excrete symmetrically. Scattered subcentimeter hypoattenuating foci in both kidneys too small to fully characterize on CT imaging but statistically likely benign. Indeterminate 2.5 cm lesion seen in the upper pole left kidney, incompletely characterized on this exam. No obstructive urolithiasis or hydronephrosis. Circumferential bladder wall thickening is noted. Indentation of bladder base by the enlarged prostate. Stomach/Bowel: Distal esophagus, stomach and duodenal sweep are unremarkable. No small bowel wall thickening or dilatation. No evidence of obstruction. A normal appendix is visualized. Portion of the sigmoid colon protrudes into a left inguinal hernia sac without convincing evidence of mechanical obstruction or features of bowel vascular compromise. Slight asymmetric thickening is noted at the rectosigmoid along the  right lateral wall, possibly related to underdistention though incompletely assessed. No other colonic thickening or dilatation. Vascular/Lymphatic: Atherosclerotic plaque throughout the abdominal aorta and branch vessels with some moderate narrowing at the ostium the celiac axis and more mild ostial narrowing the origins of the renal arteries. Severe narrowing of the IMA origin. Fusiform ectasia of left internal iliac up to 11 mm. Major venous structures are unremarkable. No suspicious or enlarged lymph nodes in the included lymphatic chains. Reproductive: Prostatomegaly. No concerning prostate or seminal vesicular lesions. Other: Diffuse body wall edema and edematous changes in the mesentery with small volume ascites. Additional fluid seen within the left hernia sac is likely redistributed. Additional trace fluid in a fat containing right inguinal hernia as well. No abdominopelvic free air. Musculoskeletal: Superior endplate compression deformity at L1 is age indeterminate with some sclerotic features which could suggest a subacute nature. 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 Bony pelvis is intact and congruent with degenerative changes of the SI joints, pubic symphysis and bilateral hips. IMPRESSION: 1. Diffuse features of volume overload/anasarca with pleural effusions, body wall edema, mesenteric edema and small volume ascites. 2. Cardiomegaly with with some evidence of right heart failure or elevated right heart pressures given reflux of contrast into the hepatic veins. 3. Basilar airways thickening as well as patchy ground-glass in the right middle lobe. Findings may represent an infectious/inflammatory process or sequela of aspiration. 4 mm nodule in the right middle lobe favored to be infectious/inflammatory. Recommend follow-up chest CT in 6-8 weeks  following resolution of the patient's acute symptoms. 4. Slight asymmetric thickening at the right lateral wall of the rectosigmoid, possibly related to underdistention though incompletely assessed. Could reflect a proctitis though underlying malignancy is not excluded. Recommend correlation with recent colonoscopy or direct visualization. 5. Left inguinal hernia sac contains a portion of the sigmoid colon without convincing evidence of mechanical obstruction or features of bowel vascular compromise. 6. Hepatic steatosis. 7. Hyperdense material layering towards the gallbladder neck may reflect some calcified biliary sand/gallstones or biliary sludge. No other CT findings of acute cholecystitis though recommend assessment with clinical exam findings and consideration of dedicated right upper quadrant ultrasound. 8. Indeterminate 2.5 cm lesion in the upper pole left kidney, incompletely characterized on this exam. Recommend further evaluation with nonemergent renal ultrasound. 9. Superior endplate compression deformity at L1 is age indeterminate with some sclerotic features which could suggest a subacute nature. Correlate for point tenderness. 10. Three-vessel coronary artery disease. Coronary artery calcifications are present. Please note that the presence of coronary artery calcium documents the presence of coronary artery disease, the severity of this disease and any potential stenosis cannot be assessed on this non-gated CT  examination. Assessment for potential risk factor modification, dietary therapy or pharmacologic therapy may be warranted. 11. Aortic Atherosclerosis (ICD10-I70.0). Electronically Signed   By: Lovena Le M.D.   On: 09/08/2019 16:13    I have personally reviewed images of chest xray-pulmonary edema  EKG:  Personally reviewed.  Atrial fibrillation  ASSESSMENT AND PLAN: Acute on chronic systolic heart failure (EF 25-30% by TTE on 08/10/2019) : Evidence of volume overload on exam-short of  breath but not in distress.  Will admit to telemetry-start intravenous Lasix.  Per patient's daughter-patient is no longer taking metoprolol.  Per prior discharge summary-patient has not been able to tolerate ACE inhibitor in the past because of hypotension.  Will monitor closely.  Patient's daughter aware that given patient's advanced age-frailty-bilateral AKA status-he is not a candidate for aggressive care.  DNR discussed-she agrees.  Chronic atrial fibrillation: EKG with atrial fibrillation-no longer on metoprolol for unknown reason-but suspect due to soft blood pressure/hypotension.  Continue Eliquis for now.  Rate appears to be well controlled without the use of any rate control agents.  CKD stage IIIb: Creatinine close to baseline-watch closely while on diuretics  History of peripheral arterial disease s/p bilateral AKA: Mostly bed to wheelchair bound-but apparently does have physical therapy coming to his house-as patient still has mobility-i.e able to sit up and move around in bed  Psoriasis: Continue local steroids  4 mm right middle lobe lung nodule: Radiology recommends repeat CT chest in 6-8 weeks.  Goals of care/palliative care: Long discussion with patient's daughter at bedside-she realizes that the patient is not a candidate for aggressive care.  She also would not like him to suffer.  She agrees for DNR order.  Plans are to diurese him as much as possible and get him home to maintain what of a quality of life he has left.  Further plan will depend as patient's clinical course evolves and further radiologic and laboratory data become available. Patient will be monitored closely.  Above noted plan was discussed with patient/family  face to face at bedside, they were in agreement.   Daughter-Tammy Mitzi Davenport #[phone].  She will be going out of town for a job interview and be back on Saturday.  She will be available via phone-other family members will be available if  needed.  CONSULTS: None  DVT Prophylaxis: Eliquis  Code Status: DNR  Disposition Plan:  Discharge back home possibly in 2-3 days, depending on clinical course  Admission status:  Inpatient going to tele  he medical decision making on this patient was of high complexity and the patient is at high risk for clinical deterioration, therefore this is a level 3 visit.   Total time spent  55 minutes.Greater than 50% of this time was spent in counseling, explanation of diagnosis, planning of further management, and coordination of care.  Severity of illness:  The appropriate patient status for this patient is INPATIENT. Inpatient status is judged to be reasonable and necessary in order to provide the required intensity of service to ensure the patient's safety. The patient's presenting symptoms, physical exam findings, and initial radiographic and laboratory data in the context of their chronic comorbidities is felt to place them at high risk for further clinical deterioration. Furthermore, it is not anticipated that the patient will be medically stable for discharge from the hospital within 2 midnights of admission. The following factors support the patient status of inpatient.   " The patient's presenting symptoms include shortness of breath " The worrisome physical  exam findings include bibasilar rales, elevated JVD " The initial radiographic and laboratory data are worrisome because of chest x-ray with pulmonary edema. " The chronic co-morbidities include chronic systolic heart failure, bilateral AKA   * I certify that at the point of admission it is my clinical judgment that the patient will require inpatient hospital care spanning beyond 2 midnights from the point of admission due to high intensity of service, high risk for further deterioration and high frequency of surveillance required.**  Shavonne Ambroise [facility] Pager [phone]  If 7PM-7AM, please contact  night-coverage  Please page via [email or web address]  Go to [email or web address] and use [place]'s universal password to access. If you do not have the password, please contact the hospital operator.  Locate the [facility] provider you are looking for under [facility] and page to a number that you can be directly reached. If you still have difficulty reaching the provider, please page the [facility] (Director on Call) for the Hospitalists listed on amion for assistance.  09/08/2019, 5:16 PM

## 2019-09-08 NOTE — ED Provider Notes (Signed)
[facility] EMERGENCY DEPARTMENT Provider Note   CSN: [id number] Arrival date & time: 09/08/19  1021     History Chief Complaint  Patient presents with  . Shortness of Breath  . Diarrhea    [name] [name] is a 86 y.o. male.  Patient brought in by his daughter.  For shortness of breath predominantly at night.  Is been going on for a week.  Patient has a known history of congestive heart failure.  Patient is also been having some abdominal discomfort and diarrhea and some swelling of the stomach.  Patient denies any chest pain.  Patient is a bilateral above-the-knee amputee.        Past Medical History:  Diagnosis Date  . Chronic kidney disease    RENAL INSUFICCIENCY  . Diabetes mellitus without complication ([place])   . DVT (deep venous thrombosis) ([place])   . Gout   . Hypertension   . Lymphadenitis 08/10/2012  . Psoriasis     Patient Active Problem List   Diagnosis Date Noted  . Acute on chronic combined systolic and diastolic CHF (congestive heart failure) ([place]) 08/09/2019  . PAF (paroxysmal atrial fibrillation) ([place])   . Scrotal swelling   . Hypotension   . CHF exacerbation ([place]) 03/18/2019  . Acute on chronic systolic CHF (congestive heart failure) ([place]) 11/23/2018  . Troponin level elevated 11/23/2018  . Acute on chronic heart failure ([place])   . Atrial fibrillation and flutter ([place])   . Atrial fibrillation with RVR ([place]) 07/15/2018  . S/P AKA (above knee amputation) bilateral ([place])   . Pressure injury of skin 10/04/2017  . Hyperkalemia 11/17/2016  . Venous stasis ulcer ([place]) 11/17/2016  . Venous stasis ulcer of lower extremity ([place]) 05/23/2015  . Cellulitis of both lower extremities 10/18/2014  . Chronic renal insufficiency, stage 3 (moderate) ([place]) 10/18/2014  . HTN (hypertension), benign 10/18/2014  . Chronic diastolic CHF (congestive heart failure) ([place]) 10/18/2014  . Lower extremity cellulitis 10/18/2014  . PAD (peripheral artery disease)  ([place]) 02/02/2014  . Atherosclerosis of native arteries of the extremities with ulceration ([place]) 07/22/2013  . Acute renal failure ([place]) 11/15/2012  . Fall 11/15/2012  . Diabetic ulcer of left foot ([place]) 10/15/2012  . Diabetes mellitus ([place]) 10/15/2012  . Lymphadenitis 08/07/2012  . Cellulitis 08/07/2012  . HTN (hypertension) 08/05/2012  . Renal insufficiency 08/05/2012  . Tobacco abuse 08/05/2012  . Gout 08/05/2012  . Hyperglycemia 08/05/2012    Past Surgical History:  Procedure Laterality Date  . AMPUTATION Bilateral 10/08/2017   Procedure: Bilateral  ABOVE KNEE amputations;  Surgeon: Conrad Webster, MD;  Location: [facility] OR;  Service: Vascular;  Laterality: Bilateral;       Family History  Problem Relation Age of Onset  . Hypertension Mother   . Stroke Mother   . Hypertension Father     Social History   Tobacco Use  . Smoking status: Former Smoker    Years: 40.00    Types: Pipe  . Smokeless tobacco: Never Used  Substance Use Topics  . Alcohol use: No  . Drug use: No    Home Medications Prior to Admission medications   Medication Sig Start Date End Date Taking? Authorizing Provider  ELIQUIS 5 MG TABS tablet TAKE 1 TABLET BY MOUTH TWICE DAILY Patient taking differently: Take 5 mg by mouth 2 (two) times daily.  07/25/19   Fulp, Cammie, MD  famotidine (PEPCID) 20 MG tablet Take 1 tablet (20 mg total) by mouth 2 (two) times daily. To  reduce stomach acid 08/26/19   Fulp, Cammie, MD  fluocinolone (SYNALAR) 0.025 % cream Apply topically 2 (two) times daily. To the scalp and sparingly to the face Patient taking differently: Apply 1 application topically 2 (two) times daily. To the scalp and sparingly to the face 05/27/19   Fulp, Cammie, MD  furosemide (LASIX) 40 MG tablet Take 1 tablet (40 mg total) by mouth 2 (two) times daily. Take 2 pills (40mg ) by mouth in the morning and 1 pill (20mg ) by mouth in the evening. 08/12/19   Barb Merino, MD  gabapentin (NEURONTIN) 100 MG capsule  Take 1 capsule (100 mg total) by mouth See admin instructions. Take 200mg  in the morning, 200mg  every evening and 300mg  at bedtime. 08/12/19 09/11/19  Barb Merino, MD  hydrocortisone cream 1 % Apply topically 2 (two) times daily. 11/25/18   Shelly Coss, MD  metoprolol succinate (TOPROL-XL) 100 MG 24 hr tablet Take 100 mg by mouth daily. Take with or immediately following a meal.    [provider]  Potassium Chloride ER 20 MEQ TBCR Take 20 mEq by mouth daily. 08/12/19 09/11/19  Barb Merino, MD  silver sulfADIAZINE (SILVADENE) 1 % cream Apply 1 application topically daily. Once daily to area of skin irritation on the right stump x 10 days then as needed Patient taking differently: Apply 1 application topically See admin instructions. Once daily to area of skin irritation on the right stump x 10 days then as needed 05/27/19   Fulp, Cammie, MD  triamcinolone ointment (KENALOG) 0.1 % Apply 1 application topically 2 (two) times daily as needed (skin irritation.).  07/27/19   [provider]    Allergies    Patient has no known allergies.  Review of Systems   Review of Systems  Constitutional: Negative for chills and fever.  HENT: Negative for congestion, rhinorrhea and sore throat.   Eyes: Negative for visual disturbance.  Respiratory: Positive for shortness of breath. Negative for cough.   Cardiovascular: Negative for chest pain and leg swelling.  Gastrointestinal: Positive for abdominal pain and diarrhea. Negative for nausea and vomiting.  Genitourinary: Negative for dysuria.  Musculoskeletal: Negative for back pain and neck pain.  Skin: Negative for rash.  Neurological: Negative for dizziness, light-headedness and headaches.  Hematological: Does not bruise/bleed easily.  Psychiatric/Behavioral: Negative for confusion.    Physical Exam Updated Vital Signs BP (!) 125/98   Pulse (!) 106   Temp 97.8 F (36.6 C) (Oral)   Resp 20   SpO2 99%   Physical Exam Vitals  and nursing note reviewed.  Constitutional:      Appearance: Normal appearance. He is well-developed.  HENT:     Head: Normocephalic and atraumatic.  Eyes:     Extraocular Movements: Extraocular movements intact.     Conjunctiva/sclera: Conjunctivae normal.     Pupils: Pupils are equal, round, and reactive to light.  Cardiovascular:     Rate and Rhythm: Normal rate and regular rhythm.     Heart sounds: No murmur.  Pulmonary:     Effort: Pulmonary effort is normal. No respiratory distress.     Breath sounds: Normal breath sounds.  Abdominal:     General: There is no distension.     Palpations: Abdomen is soft.     Tenderness: There is no abdominal tenderness.     Comments: Abdomen without any tenderness to palpation.  Slightly distended.  Musculoskeletal:        General: Normal range of motion.  Cervical back: Normal range of motion and neck supple.     Comments: Bilateral above-the-knee amputee.  Skin:    General: Skin is warm and dry.  Neurological:     General: No focal deficit present.     Mental Status: He is alert and oriented to person, place, and time. Mental status is at baseline.     ED Results / Procedures / Treatments   Labs (all labs ordered are listed, but only abnormal results are displayed) Labs Reviewed  CBC WITH DIFFERENTIAL/PLATELET - Abnormal; Notable for the following components:      Result Value   WBC 3.2 (*)    RBC 3.90 (*)    Hemoglobin 12.9 (*)    MCV 104.9 (*)    RDW 16.5 (*)    Lymphs Abs 0.6 (*)    All other components within normal limits  COMPREHENSIVE METABOLIC PANEL - Abnormal; Notable for the following components:   Creatinine, Ser 1.48 (*)    Albumin 2.9 (*)    Total Bilirubin 1.5 (*)    GFR calc non Af Amer 41 (*)    GFR calc Af Amer 48 (*)    All other components within normal limits  BRAIN NATRIURETIC PEPTIDE - Abnormal; Notable for the following components:   B Natriuretic Peptide 2,050.1 (*)    All other components within  normal limits  SARS CORONAVIRUS 2 BY RT PCR [facility] ORDER, [facility])    EKG EKG Interpretation  Date/Time:  Thursday Sep 08 2019 10:22:48 EDT Ventricular Rate:  99 PR Interval:    QRS Duration: 84 QT Interval:  362 QTC Calculation: 464 R Axis:   111 Text Interpretation: Atrial fibrillation Right axis deviation Septal infarct , age undetermined Abnormal ECG Confirmed by Fredia Sorrow [id number]) on 09/08/2019 11:38:51 AM   Radiology DG Chest 2 View  Result Date: 09/08/2019 CLINICAL DATA:  86 year old male with history of shortness of breath for 1 week. EXAM: CHEST - 2 VIEW COMPARISON:  Chest x-ray 08/09/2019. FINDINGS: Bibasilar opacities (left greater than right) which may reflect areas of atelectasis and/or consolidation, although this is likely accentuated by under penetration of the image. Small left pleural effusion. No right pleural effusion. There is cephalization of the pulmonary vasculature and slight indistinctness of the interstitial markings suggestive of mild pulmonary edema. Mild cardiomegaly. Upper mediastinal contours are distorted by patient positioning. IMPRESSION: 1. The appearance the chest suggests congestive heart failure, as above. Electronically Signed   By: Vinnie Langton M.D.   On: 09/08/2019 11:21    Procedures Procedures (including critical care time)  Medications Ordered in ED Medications  furosemide (LASIX) injection 80 mg (has no administration in time range)  iohexol (OMNIPAQUE) 300 MG/ML solution 100 mL (100 mLs Intravenous Contrast Given 09/08/19 1537)    ED Course  I have reviewed the triage vital signs and the nursing notes.  Pertinent labs & imaging results that were available during my care of the patient were reviewed by me and considered in my medical decision making (see chart for details).    MDM Rules/Calculators/A&P                        Patient chest x-ray consistent with CHF.  BNP is elevated as  well.  Patient without a leukocytosis not anemic.  Patient with some renal insufficiency but not significantly changed from baseline.  Pad patient evaluated by outpatient Lasix protocol people family did not want to do  that.  Patient given 80 mg of Lasix here.  Will require admission for the congestive heart failure which is probably explaining the difficulty with shortness of breath at night.  CT scan of abdomen due to the diarrhea and abdominal distention and the subjective abdominal pain.  Is pending.  Patient turned over to the evening physician Dr. Vanita Panda Final Clinical Impression(s) / ED Diagnoses Final diagnoses:  Acute on chronic congestive heart failure, unspecified heart failure type [facility])    Rx / DC Orders ED Discharge Orders    None       Fredia Sorrow, MD 09/08/19 1559

## 2019-09-08 NOTE — ED Notes (Signed)
Lunch Tray Ordered @ 8675.

## 2019-09-08 NOTE — ED Notes (Signed)
Pt transported to CT ?

## 2019-09-08 NOTE — Progress Notes (Signed)
[facility]  Consult Note  [name] [name] is a 86 year old male with HFrEF, HTN, DM, CKD, and bilateral above the knee amputations who presented to the emergency department with 3 to 4 days of progressive shortness of breath and scrotal swelling. We were consulted for possible enrollment into the hospital at home program for acute on chronic systolic heart failure.  I presented to the bedside to discuss the option with the patient and his daughter, [name] [name]. The patient states that for the last 3 to 4 days he has had significant orthopnea that prevents him from sleeping at night. In addition to this he has noticed swelling in his stumps and in his scrotum. He has been taking his furosemide as prescribed. He feels that he is peeing adequately and consumes a lot of water to prevent himself from becoming dehydrated. In addition to the above symptoms he is noticed early satiety and nausea.  I discussed the option of being enrolled in the hospital at home program for acute on chronic systolic heart failure. The patient's daughter, [name] [name], who is his primary caregiver is hesitant to the idea. She feels that he needs additional support and a couple days in the hospital to be tuned up before coming back home.  This will be the patient's fourth admission for heart failure in the last 12 months. He tells me that he is grateful to have lived 86 years old. He primarily cares about his breathing and his ability to eat. He states that his symptoms are the most bothersome things to him. We discussed switching our focus to more of a palliative care approach. The patient, his daughter, and I had a long discussion about what palliative care is. They both agree that this would be beneficial.  I would recommend a palliative care consult while the patient is hospitalized. Could also consider switching him to torsemide on discharge.  The patient and his family members declined admission into the hospital at home  program.  Please do not hesitate to call with questions/concerns.   Ina Homes, MD 09/08/2019, 2:51 PM  Cell [phone]

## 2019-09-08 NOTE — ED Notes (Signed)
CT notified of IV placement. 

## 2019-09-08 NOTE — ED Triage Notes (Signed)
Pt c/o shortness of breath x 1 week; comes and go in nature; patient c/o diarrhea as well as swelling in stomach.

## 2019-09-08 NOTE — ED Provider Notes (Signed)
4:36 PM Care of the patient assumed at signout. On exam now, the patient has persistent tachypnea, oxygen saturation in the 92, 93% range, continues to complain of abdominal distention. CT suggests diffuse anasarca, worsening heart failure.  Patient has received IV Lasix, 80 mg, Covid test is pending.  He notes that he has not received his vaccine this year. He will require admission for further monitoring, management given his worsening renal function, worsening heart failure.   Carmin Muskrat, MD 09/08/19 [phone]

## 2019-09-09 DIAGNOSIS — I5023 Acute on chronic systolic (congestive) heart failure: Secondary | ICD-10-CM

## 2019-09-09 DIAGNOSIS — Z89612 Acquired absence of left leg above knee: Secondary | ICD-10-CM

## 2019-09-09 DIAGNOSIS — I4821 Permanent atrial fibrillation: Secondary | ICD-10-CM

## 2019-09-09 DIAGNOSIS — N1832 Chronic kidney disease, stage 3b: Secondary | ICD-10-CM

## 2019-09-09 DIAGNOSIS — R5381 Other malaise: Secondary | ICD-10-CM

## 2019-09-09 DIAGNOSIS — Z789 Other specified health status: Secondary | ICD-10-CM

## 2019-09-09 DIAGNOSIS — Z66 Do not resuscitate: Secondary | ICD-10-CM

## 2019-09-09 DIAGNOSIS — L409 Psoriasis, unspecified: Secondary | ICD-10-CM

## 2019-09-09 DIAGNOSIS — Z89611 Acquired absence of right leg above knee: Secondary | ICD-10-CM

## 2019-09-09 LAB — BASIC METABOLIC PANEL
Anion gap: 9 (ref 5–15)
BUN: 24 mg/dL — ABNORMAL HIGH (ref 8–23)
CO2: 28 mmol/L (ref 22–32)
Calcium: 8.6 mg/dL — ABNORMAL LOW (ref 8.9–10.3)
Chloride: 101 mmol/L (ref 98–111)
Creatinine, Ser: 1.53 mg/dL — ABNORMAL HIGH (ref 0.61–1.24)
GFR calc Af Amer: 46 mL/min — ABNORMAL LOW (ref >60)
GFR calc non Af Amer: 39 mL/min — ABNORMAL LOW (ref >60)
Glucose, Bld: 100 mg/dL — ABNORMAL HIGH (ref 70–99)
Potassium: 3.9 mmol/L (ref 3.5–5.1)
Sodium: 138 mmol/L (ref 135–145)

## 2019-09-09 LAB — MRSA PCR SCREENING: MRSA by PCR: POSITIVE — AB

## 2019-09-09 MED ORDER — CHLORHEXIDINE GLUCONATE CLOTH 2 % EX PADS
6.0000 | MEDICATED_PAD | Freq: Every day | CUTANEOUS | Status: DC
  Administered 2019-09-09 – 2019-09-12 (×4): 6 via TOPICAL

## 2019-09-09 MED ORDER — MUPIROCIN 2 % EX OINT
1.0000 "application " | TOPICAL_OINTMENT | Freq: Two times a day (BID) | CUTANEOUS | Status: DC
  Administered 2019-09-09 – 2019-09-12 (×7): 1 via NASAL
  Filled 2019-09-09: qty 22

## 2019-09-09 NOTE — Evaluation (Signed)
Physical Therapy Evaluation Patient Details Name: [name] [name] MRN: [id number] DOB: 12-06-1926 Today's Date: 09/09/2019   History of Present Illness  86 year old male with history of systolic CHF (EF 74-08% on 08/10/2019) chronic A. fib on Eliquis, CKD-3B, debility with bilateral AKA, HTN and psoriasis presenting with SOB, PND, orthopnea and swelling, and admitted for acute on chronic combined CHF exacerbation.  Clinical Impression  Pt admitted with above diagnosis. Pt was able sit on bed in long sit and perform bed mobility with min guard assist to independence.   Pt demonstrated ability to scoot side to side in bed as well. Pt refused to get into the chair today. Will follow acutely.  Pt currently with functional limitations due to the deficits listed below (see PT Problem List). Pt will benefit from skilled PT to increase their independence and safety with mobility to allow discharge to the venue listed below.      Follow Up Recommendations Home health PT;Supervision/Assistance - 24 hour    Equipment Recommendations  None recommended by PT    Recommendations for Other Services       Precautions / Restrictions Precautions Precautions: Fall Restrictions Weight Bearing Restrictions: No      Mobility  Bed Mobility Overal bed mobility: Independent                Transfers                 General transfer comment: refused to practice transfers today  Ambulation/Gait             General Gait Details: Does not ambulate  Stairs            Wheelchair Mobility    Modified Rankin (Stroke Patients Only)       Balance Overall balance assessment: Needs assistance Sitting-balance support: No upper extremity supported;Feet supported Sitting balance-Leahy Scale: Fair Sitting balance - Comments: can long sit in bed with good balance and moves side to side using UEs to move body.                                      Pertinent Vitals/Pain Pain  Assessment: No/denies pain    Home Living Family/patient expects to be discharged to:: Private residence Living Arrangements: Children Available Help at Discharge: Family;Available PRN/intermittently(daughter) Type of Home: House Home Access: Ramped entrance     Home Layout: One level Home Equipment: Transport planner;Bedside commode;Hospital bed Additional Comments: reports "I was planning on getting my legs soon."    Prior Function Level of Independence: Needs assistance   Gait / Transfers Assistance Needed: power w/c for mobility; sliding board for car transfers; AP transfers otherwise  ADL's / Homemaking Assistance Needed: Pt able to perform UB ADL and requires assist for successful LB ADL.        Hand Dominance   Dominant Hand: Right    Extremity/Trunk Assessment   Upper Extremity Assessment Upper Extremity Assessment: RUE deficits/detail;LUE deficits/detail RUE Deficits / Details: cant raise UEs above 90 degrees, shoulder 3-/5, elbow 4-/5, grip 2+/5, wrist 2/5 LUE Deficits / Details: cant raise UEs above 90 degrees, shoulder 3-/5, elbow 4-/5, grip 2+/5, wrist 2/5     Lower Extremity Assessment Lower Extremity Assessment: Overall WFL for tasks assessed    Cervical / Trunk Assessment Cervical / Trunk Assessment: Kyphotic  Communication   Communication: HOH  Cognition Arousal/Alertness: Awake/alert Behavior During Therapy: WFL for tasks assessed/performed Overall  Cognitive Status: Within Functional Limits for tasks assessed                                        General Comments      Exercises General Exercises - Upper Extremity Shoulder Flexion: AROM;Both;10 reps;Supine Elbow Flexion: AROM;Both;10 reps;Supine Elbow Extension: AROM;Both;10 reps;Supine General Exercises - Lower Extremity Gluteal Sets: AROM;Both;5 reps;Supine Hip Flexion/Marching: AROM;Both;5 reps;Supine   Assessment/Plan    PT Assessment Patient needs continued PT  services  PT Problem List Decreased activity tolerance;Decreased balance;Decreased mobility;Decreased knowledge of use of DME;Decreased safety awareness;Decreased knowledge of precautions;Decreased strength;Decreased range of motion       PT Treatment Interventions DME instruction;Functional mobility training;Therapeutic activities;Therapeutic exercise;Patient/family education;Wheelchair mobility training    PT Goals (Current goals can be found in the Care Plan section)  Acute Rehab PT Goals Patient Stated Goal: to go home PT Goal Formulation: With patient Time For Goal Achievement: 09/23/19 Potential to Achieve Goals: Good    Frequency Min 3X/week   Barriers to discharge        Co-evaluation               AM-PAC PT "6 Clicks" Mobility  Outcome Measure Help needed turning from your back to your side while in a flat bed without using bedrails?: None Help needed moving from lying on your back to sitting on the side of a flat bed without using bedrails?: None Help needed moving to and from a bed to a chair (including a wheelchair)?: A Little Help needed standing up from a chair using your arms (e.g., wheelchair or bedside chair)?: Total Help needed to walk in hospital room?: Total Help needed climbing 3-5 steps with a railing? : Total 6 Click Score: 14    End of Session   Activity Tolerance: Patient tolerated treatment well Patient left: in bed;with call bell/phone within reach Nurse Communication: Mobility status PT Visit Diagnosis: Muscle weakness (generalized) (M62.81)    Time: 0017-4944 PT Time Calculation (min) (ACUTE ONLY): 13 min   Charges:   PT Evaluation $PT Eval Moderate Complexity: 1 Mod          Erian Lariviere W,PT Acute Rehabilitation Services Pager:  [phone]  Office:  [phone]    Denice Paradise 09/09/2019, 4:09 PM

## 2019-09-09 NOTE — Progress Notes (Signed)
PROGRESS NOTE  [name] [name] IRW:[id number] DOB: December 16, 1926   PCP: [name], MD  Patient is from: Home.  Lives with his daughter.  Uses motorized wheelchair at baseline.  DOA: 09/08/2019 LOS: 1  Brief Narrative / Interim history: 86 year old male with history of systolic CHF (EF 76-19% on 08/10/2019) chronic A. fib on Eliquis, CKD-3B, debility with bilateral AKA, HTN and psoriasis presenting with SOB, PND, orthopnea and swelling, and admitted for acute on chronic combined CHF exacerbation.  BNP elevated.  CXR consistent with CHF.  He was started on IV Lasix.   Subjective: Seen and examined earlier this morning.  Reports some improvement in his breathing and swelling.  He denies chest pain, nausea, vomiting or abdominal pain but reports diarrhea that he describes as loose stools.  He denies UTI symptoms.  Objective: Vitals:   09/08/19 2100 09/08/19 2352 09/09/19 0420 09/09/19 0911  BP:  109/84 107/84 96/72  Pulse:  98 94 91  Resp:  20 14 20   Temp:  98.2 F (36.8 C) 98.2 F (36.8 C) (!) 97.5 F (36.4 C)  TempSrc:  Oral Oral Oral  SpO2: 99% 100% 100% 100%  Weight:   70.3 kg   Height:        Intake/Output Summary (Last 24 hours) at 09/09/2019 1140 Last data filed at 09/09/2019 1010 Gross per 24 hour  Intake 585 ml  Output 5050 ml  Net -4465 ml   Filed Weights   09/08/19 2051 09/09/19 0420  Weight: 71.7 kg 70.3 kg    Examination:  GENERAL: No apparent distress.  Nontoxic. HEENT: MMM.  Vision and hearing grossly intact.  NECK: Supple.  No apparent JVD.  RESP: 100% on RA.  No IWOB.  Fair aeration.  Bibasilar crackles. CVS:  RRR. Heart sounds normal.  ABD/GI/GU: BS+. Abd soft, NTND.  MSK/EXT: Bilateral AKA with some swelling. SKIN: Psoriatic skin rash over his head and upper back NEURO: Awake, alert and oriented fairly.  No apparent focal neuro deficit. PSYCH: Calm. Normal affect.  Fair insight  Procedures:  None  Microbiology summarized: COVID-19 PCR negative. MRSA  PCR screen positive.  Assessment & Plan: Acute on chronic systolic CHF: Presents with cardinal symptoms. BNP and CXR consistent with CHF.  On Lasix 40 mg in the morning and 20 mg in the evening at home.  Not on GDMT due to hypotension.  Responded to IV Lasix well.  UOP 4.3 L overnight. Cr slightly up. -Continue IV Lasix 40 mg twice daily -GDMT-reportedly did not tolerate due to hypotension in the past -Monitor fluid status, renal function and electrolytes -Sodium and fluid restrictions  Permanent A. Fib: Rate controlled without medications.  Per family, metoprolol discontinued due to hypotension.  On Eliquis for anticoagulation. -Continue home Eliquis  CKD stage IIIb: Baseline Cr 1.3-1.4> 1.48 (admit) > 1.53. -Closely monitor while on IV diuretics -Avoid nephrotoxic meds  History of PAD s/p bilateral AKA: Has electric wheelchair and therapy for transfer -Continue PT/OT here  Psoriasis:  -Continue local steroids  4 mm right middle lobe lung nodule:  -Radiology recommends repeat CT chest in 6-8 weeks.  Goals of care/palliative care: Appropriately DNR/DNI per discussion between admitting provider, patient and family.        Pressure Injury 09/08/19 Buttocks Medial Stage 1 -  Intact skin with non-blanchable redness of a localized area usually over a bony prominence. (Active)  09/08/19 2100  Location: Buttocks  Location Orientation: Medial  Staging: Stage 1 -  Intact skin with non-blanchable redness of a localized  area usually over a bony prominence.  Wound Description (Comments):   Present on Admission: Yes   DVT prophylaxis: On Eliquis for A. fib Code Status: DNR/DNI Family Communication: Patient and/or RN. Available if any question.  Status is: Inpatient  Remains inpatient appropriate because:Due to CHF exacerbation requiring IV diuretics   Dispo: The patient is from: Home              Anticipated d/c is to: Home              Anticipated d/c date is: 2 days               Patient currently is not medically stable to d/c.        Consultants:  None   Sch Meds:  Scheduled Meds:  apixaban  5 mg Oral BID   Chlorhexidine Gluconate Cloth  6 each Topical Q0600   famotidine  20 mg Oral Daily   furosemide  40 mg Intravenous Q12H   gabapentin  200 mg Oral TID   mupirocin ointment  1 application Nasal BID   potassium chloride SA  40 mEq Oral Daily   sodium chloride flush  3 mL Intravenous Q12H   triamcinolone cream   Topical BID   Continuous Infusions:  sodium chloride     PRN Meds:.sodium chloride, acetaminophen, ondansetron (ZOFRAN) IV, sodium chloride flush  Antimicrobials: Anti-infectives (From admission, onward)   None       I have personally reviewed the following labs and images: CBC: Recent Labs  Lab 09/08/19 1143  WBC 3.2*  NEUTROABS 2.1  HGB 12.9*  HCT 40.9  MCV 104.9*  PLT 202   BMP &GFR Recent Labs  Lab 09/08/19 1143 09/09/19 0639  NA 139 138  K 4.7 3.9  CL 103 101  CO2 25 28  GLUCOSE 99 100*  BUN 21 24*  CREATININE 1.48* 1.53*  CALCIUM 9.1 8.6*   Estimated Creatinine Clearance: 18.9 mL/min (A) (by C-G formula based on SCr of 1.53 mg/dL (H)). Liver & Pancreas: Recent Labs  Lab 09/08/19 1143  AST 30  ALT 18  ALKPHOS 80  BILITOT 1.5*  PROT 6.8  ALBUMIN 2.9*   No results for input(s): LIPASE, AMYLASE in the last 168 hours. No results for input(s): AMMONIA in the last 168 hours. Diabetic: No results for input(s): HGBA1C in the last 72 hours. No results for input(s): GLUCAP in the last 168 hours. Cardiac Enzymes: No results for input(s): CKTOTAL, CKMB, CKMBINDEX, TROPONINI in the last 168 hours. No results for input(s): PROBNP in the last 8760 hours. Coagulation Profile: No results for input(s): INR, PROTIME in the last 168 hours. Thyroid Function Tests: No results for input(s): TSH, T4TOTAL, FREET4, T3FREE, THYROIDAB in the last 72 hours. Lipid Profile: No results for input(s): CHOL, HDL,  LDLCALC, TRIG, CHOLHDL, LDLDIRECT in the last 72 hours. Anemia Panel: No results for input(s): VITAMINB12, FOLATE, FERRITIN, TIBC, IRON, RETICCTPCT in the last 72 hours. Urine analysis:    Component Value Date/Time   COLORURINE YELLOW 03/18/2019 1834   APPEARANCEUR HAZY (A) 03/18/2019 1834   LABSPEC 1.006 03/18/2019 1834   PHURINE 6.0 03/18/2019 1834   GLUCOSEU NEGATIVE 03/18/2019 1834   HGBUR MODERATE (A) 03/18/2019 1834   BILIRUBINUR NEGATIVE 03/18/2019 [place] 03/18/2019 1834   PROTEINUR NEGATIVE 03/18/2019 1834   UROBILINOGEN 0.2 11/16/2012 0345   NITRITE NEGATIVE 03/18/2019 1834   LEUKOCYTESUR LARGE (A) 03/18/2019 1834   Sepsis Labs: Invalid input(s): PROCALCITONIN, [place]  Microbiology:  Recent Results (from the past 240 hour(s))  SARS Coronavirus 2 by RT PCR (hospital order, performed in [facility] hospital lab) Nasopharyngeal Nasopharyngeal Swab     Status: None   Collection Time: 09/08/19  3:55 PM   Specimen: Nasopharyngeal Swab  Result Value Ref Range Status   SARS Coronavirus 2 NEGATIVE NEGATIVE Final    Comment: (NOTE) SARS-CoV-2 target nucleic acids are NOT DETECTED. The SARS-CoV-2 RNA is generally detectable in upper and lower respiratory specimens during the acute phase of infection. The lowest concentration of SARS-CoV-2 viral copies this assay can detect is 250 copies / mL. A negative result does not preclude SARS-CoV-2 infection and should not be used as the sole basis for treatment or other patient management decisions.  A negative result may occur with improper specimen collection / handling, submission of specimen other than nasopharyngeal swab, presence of viral mutation(s) within the areas targeted by this assay, and inadequate number of viral copies (<250 copies / mL). A negative result must be combined with clinical observations, patient history, and epidemiological information. Fact Sheet for Patients:     StrictlyIdeas.no Fact Sheet for Healthcare Providers: BankingDealers.co.za This test is not yet approved or cleared  by the [place] FDA and has been authorized for detection and/or diagnosis of SARS-CoV-2 by FDA under an Emergency Use Authorization (EUA).  This EUA will remain in effect (meaning this test can be used) for the duration of the COVID-19 declaration under Section 564(b)(1) of the Act, 21 U.S.C. section 360bbb-3(b)(1), unless the authorization is terminated or revoked sooner. Performed at [facility], [place] [address], [place], [place] [address]   MRSA PCR Screening     Status: Abnormal   Collection Time: 09/09/19  4:27 AM   Specimen: Nasopharyngeal  Result Value Ref Range Status   MRSA by PCR POSITIVE (A) NEGATIVE Final    Comment:        The GeneXpert MRSA Assay (FDA approved for NASAL specimens only), is one component of a comprehensive MRSA colonization surveillance program. It is not intended to diagnose MRSA infection nor to guide or monitor treatment for MRSA infections. RESULT CALLED TO, READ BACK BY AND VERIFIED WITH: ROBERTS,A RN 09/09/2019 AT 0615 [facility]     Radiology Studies: CT Abdomen Pelvis W Contrast  Result Date: 09/08/2019 CLINICAL DATA:  Diverticulitis suspected, shortness of breath EXAM: CT ABDOMEN AND PELVIS WITH CONTRAST TECHNIQUE: Multidetector CT imaging of the abdomen and pelvis was performed using the standard protocol following bolus administration of intravenous contrast. CONTRAST:  147mL OMNIPAQUE IOHEXOL 300 MG/ML  SOLN COMPARISON:  Renal ultrasound 08/07/2012 FINDINGS: Lower chest: Moderate bilateral pleural effusions with adjacent areas of atelectasis. Some associated basilar airways thickening is noted as well as patchy ground-glass in the right middle lobe. Additional 4 mm solid nodule favored to be infectious or inflammatory seen in the right middle lobe. Cardiomegaly without  pericardial effusion. Three-vessel coronary artery disease is noted. Hepatobiliary: Diffuse hepatic hypoattenuation compatible with hepatic steatosis. No focal liver lesion. Smooth liver surface contour. Hyperdense material layering towards the gallbladder neck may reflect some calcified biliary sand/gallstones or biliary sludge. No visible intraductal gallstones. No biliary ductal dilatation. Pancreas: Unremarkable. No pancreatic ductal dilatation or surrounding inflammatory changes. Spleen: Slightly heterogeneous enhancement of the spleen likely related to the timing of contrast media resulting in a late arterial phase. Splenic enhancement normalize on delayed imaging. No concerning splenic lesions. Normal splenic size. Adrenals/Urinary Tract: Lobular thickening of the adrenal glands without concerning dominant nodule may  reflect some senescent adrenal hyperplasia. Kidneys enhance and excrete symmetrically. Scattered subcentimeter hypoattenuating foci in both kidneys too small to fully characterize on CT imaging but statistically likely benign. Indeterminate 2.5 cm lesion seen in the upper pole left kidney, incompletely characterized on this exam. No obstructive urolithiasis or hydronephrosis. Circumferential bladder wall thickening is noted. Indentation of bladder base by the enlarged prostate. Stomach/Bowel: Distal esophagus, stomach and duodenal sweep are unremarkable. No small bowel wall thickening or dilatation. No evidence of obstruction. A normal appendix is visualized. Portion of the sigmoid colon protrudes into a left inguinal hernia sac without convincing evidence of mechanical obstruction or features of bowel vascular compromise. Slight asymmetric thickening is noted at the rectosigmoid along the right lateral wall, possibly related to underdistention though incompletely assessed. No other colonic thickening or dilatation. Vascular/Lymphatic: Atherosclerotic plaque throughout the abdominal aorta and  branch vessels with some moderate narrowing at the ostium the celiac axis and more mild ostial narrowing the origins of the renal arteries. Severe narrowing of the IMA origin. Fusiform ectasia of left internal iliac up to 11 mm. Major venous structures are unremarkable. No suspicious or enlarged lymph nodes in the included lymphatic chains. Reproductive: Prostatomegaly. No concerning prostate or seminal vesicular lesions. Other: Diffuse body wall edema and edematous changes in the mesentery with small volume ascites. Additional fluid seen within the left hernia sac is likely redistributed. Additional trace fluid in a fat containing right inguinal hernia as well. No abdominopelvic free air. Musculoskeletal: Superior endplate compression deformity at L1 is age indeterminate with some sclerotic features which could suggest a subacute nature. 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 Bony pelvis is intact and congruent with degenerative changes of the SI joints, pubic symphysis and bilateral hips. IMPRESSION: 1. Diffuse features of volume overload/anasarca with pleural effusions, body wall edema, mesenteric edema and small volume ascites. 2. Cardiomegaly with with some evidence of right heart failure or elevated right heart pressures given reflux of contrast into the hepatic veins. 3. Basilar airways thickening as well as patchy ground-glass in the right middle lobe. Findings may represent an infectious/inflammatory process or sequela of aspiration. 4 mm nodule in the right middle lobe favored to be infectious/inflammatory. Recommend follow-up chest CT in 6-8 weeks following resolution of the patient's acute symptoms. 4. Slight asymmetric thickening at the right lateral wall of the rectosigmoid, possibly related to underdistention though incompletely assessed. Could  reflect a proctitis though underlying malignancy is not excluded. Recommend correlation with recent colonoscopy or direct visualization. 5. Left inguinal hernia sac contains a portion of the sigmoid colon without convincing evidence of mechanical obstruction or features of bowel vascular compromise. 6. Hepatic steatosis. 7. Hyperdense material layering towards the gallbladder neck may reflect some calcified biliary sand/gallstones or biliary sludge. No other CT findings of acute cholecystitis though recommend assessment with clinical exam findings and consideration of dedicated right upper quadrant ultrasound. 8. Indeterminate 2.5 cm lesion in the upper pole left kidney, incompletely characterized on this exam. Recommend further evaluation with nonemergent renal ultrasound. 9. Superior endplate compression deformity at L1 is age indeterminate with some sclerotic features which could suggest a subacute nature. Correlate for point tenderness. 10. Three-vessel coronary artery disease. Coronary artery calcifications are present. Please note that the presence of coronary artery calcium documents the presence of coronary artery disease, the severity of this disease and any potential stenosis cannot be assessed on this non-gated CT examination.  Assessment for potential risk factor modification, dietary therapy or pharmacologic therapy may be warranted. 11. Aortic Atherosclerosis (ICD10-I70.0). Electronically Signed   By: Lovena Le M.D.   On: 09/08/2019 16:13      Yailin Biederman T. [place]  If 7PM-7AM, please contact night-coverage [email or web address] Password [email or web address] 09/09/2019, 11:40 AM

## 2019-09-10 LAB — RENAL FUNCTION PANEL
Albumin: 2.6 g/dL — ABNORMAL LOW (ref 3.5–5.0)
Anion gap: 10 (ref 5–15)
BUN: 27 mg/dL — ABNORMAL HIGH (ref 8–23)
CO2: 31 mmol/L (ref 22–32)
Calcium: 8.7 mg/dL — ABNORMAL LOW (ref 8.9–10.3)
Chloride: 97 mmol/L — ABNORMAL LOW (ref 98–111)
Creatinine, Ser: 1.49 mg/dL — ABNORMAL HIGH (ref 0.61–1.24)
GFR calc Af Amer: 47 mL/min — ABNORMAL LOW (ref >60)
GFR calc non Af Amer: 41 mL/min — ABNORMAL LOW (ref >60)
Glucose, Bld: 94 mg/dL (ref 70–99)
Phosphorus: 3.5 mg/dL (ref 2.5–4.6)
Potassium: 4.3 mmol/L (ref 3.5–5.1)
Sodium: 138 mmol/L (ref 135–145)

## 2019-09-10 LAB — CBC
HCT: 39.1 % (ref 39.0–52.0)
Hemoglobin: 12.9 g/dL — ABNORMAL LOW (ref 13.0–17.0)
MCH: 33.3 pg (ref 26.0–34.0)
MCHC: 33 g/dL (ref 30.0–36.0)
MCV: 101 fL — ABNORMAL HIGH (ref 80.0–100.0)
Platelets: 216 10*3/uL (ref 150–400)
RBC: 3.87 MIL/uL — ABNORMAL LOW (ref 4.22–5.81)
RDW: 16 % — ABNORMAL HIGH (ref 11.5–15.5)
WBC: 3.2 10*3/uL — ABNORMAL LOW (ref 4.0–10.5)
nRBC: 0 % (ref 0.0–0.2)

## 2019-09-10 LAB — MAGNESIUM: Magnesium: 2.1 mg/dL (ref 1.7–2.4)

## 2019-09-10 MED ORDER — LOPERAMIDE HCL 2 MG PO CAPS: 2.0000 mg | ORAL_CAPSULE | ORAL | Status: DC | PRN

## 2019-09-10 NOTE — Progress Notes (Signed)
Patient reports not receiving dinner roll on his trays, he is not on a carb modified diet so he doesn't know why. Call placed to dietary to request pt receive a dinner roll with each meal.

## 2019-09-10 NOTE — Progress Notes (Signed)
Patient c/a/ox4 during shift report, denies complaints at this time.

## 2019-09-10 NOTE — Evaluation (Signed)
Occupational Therapy Evaluation Patient Details Name: [name] [name] MRN: [id number] DOB: 1926-12-26 Today's Date: 09/10/2019    History of Present Illness 86 year old male with history of systolic CHF (EF 31-51% on 08/10/2019) chronic A. fib on Eliquis, CKD-3B, debility with bilateral AKA, HTN and psoriasis presenting with SOB, PND, orthopnea and swelling, and admitted for acute on chronic combined CHF exacerbation.   Clinical Impression   Pt admitted with see above. Pt currently with functional limitations due to the deficits listed below (see OT Problem List). Pt reports that they live with there daughter and when they are not home then there grandson comes into the home. Per pt they use power chair at all times for mobility and most of the day remains at bed level. Per pt they have assistance with most ADLS and daughter does all IADLS in the home. Pt at this time completed rolling side to side in bed with moderate to max assist with bed rails. Pt then completed at bed level UE hygiene in groom with moderate asist and UE dressing with moderate assistance. Pt will benefit from skilled OT to increase their safety and independence with ADL and functional mobility for ADL to facilitate discharge to venue listed below.       Follow Up Recommendations  Home health OT;Supervision/Assistance - 24 hour    Equipment Recommendations       Recommendations for Other Services       Precautions / Restrictions Precautions Precautions: Fall Restrictions Weight Bearing Restrictions: No      Mobility Bed Mobility Overal bed mobility: Needs Assistance Bed Mobility: Rolling Rolling: Mod assist;Max assist         General bed mobility comments: pt reporting they can not roll side to side as due to pain in L side and decrease willingess to try today  Transfers                 General transfer comment: refused to practice transfers today    Balance Overall balance assessment: Needs  assistance                                         ADL either performed or assessed with clinical judgement   ADL Overall ADL's : Needs assistance/impaired Eating/Feeding: Independent;Bed level   Grooming: Wash/dry hands;Wash/dry face;Moderate assistance;Bed level;Cueing for safety;Cueing for sequencing   Upper Body Bathing: Moderate assistance;Cueing for safety;Cueing for sequencing;Bed level   Lower Body Bathing: Maximal assistance;Bed level;+2 for physical assistance;+2 for safety/equipment   Upper Body Dressing : Minimal assistance;Bed level   Lower Body Dressing: Maximal assistance;Cueing for safety;Cueing for sequencing;Bed level;+2 for physical assistance;+2 for safety/equipment                       Vision Baseline Vision/History: No visual deficits Patient Visual Report: No change from baseline Vision Assessment?: No apparent visual deficits     Perception Perception Perception Tested?: No   Praxis Praxis Praxis tested?: Not tested    Pertinent Vitals/Pain Pain Assessment: Faces Faces Pain Scale: Hurts little more Pain Location: L hp area  Pain Descriptors / Indicators: Aching Pain Intervention(s): Limited activity within patient's tolerance;Monitored during session;Repositioned     Hand Dominance Right   Extremity/Trunk Assessment Upper Extremity Assessment Upper Extremity Assessment: Generalized weakness   Lower Extremity Assessment Lower Extremity Assessment: Defer to PT evaluation   Cervical / Trunk Assessment Cervical /  Trunk Assessment: Kyphotic   Communication Communication Communication: HOH   Cognition Arousal/Alertness: Awake/alert Behavior During Therapy: WFL for tasks assessed/performed Overall Cognitive Status: Within Functional Limits for tasks assessed                                     General Comments       Exercises Exercises: General Upper Extremity General Exercises - Upper  Extremity Shoulder Flexion: AROM;Right;Left;5 reps;Supine   Shoulder Instructions      Home Living Family/patient expects to be discharged to:: Private residence Living Arrangements: Children Available Help at Discharge: Family;Available PRN/intermittently Type of Home: House Home Access: Ramped entrance     Home Layout: One level     Bathroom Shower/Tub: Other (comment);Tub/shower unit   Bathroom Toilet: Standard Bathroom Accessibility: Yes How Accessible: Accessible via wheelchair Home Equipment: Transport planner;Bedside commode;Hospital bed   Additional Comments: reports "I was planning on getting my legs soon."      Prior Functioning/Environment Level of Independence: Needs assistance  Gait / Transfers Assistance Needed: power w/c for mobility; sliding board for car transfers; AP transfers otherwise ADL's / Homemaking Assistance Needed: Pt able to perform UB ADL and requires assist for successful LB ADL.            OT Problem List: Decreased strength;Decreased range of motion;Decreased activity tolerance;Impaired balance (sitting and/or standing);Decreased safety awareness;Decreased knowledge of use of DME or AE;Pain      OT Treatment/Interventions: Self-care/ADL training;Therapeutic exercise;DME and/or AE instruction;Therapeutic activities;Patient/family education;Balance training    OT Goals(Current goals can be found in the care plan section) Acute Rehab OT Goals Patient Stated Goal: to go home OT Goal Formulation: With patient Time For Goal Achievement: 09/24/19 Potential to Achieve Goals: Good ADL Goals Pt Will Perform Upper Body Dressing: with set-up;bed level Pt Will Perform Lower Body Dressing: with mod assist;bed level Pt Will Transfer to Toilet: with supervision;with mod assist  OT Frequency: Min 2X/week   Barriers to D/C:            Co-evaluation              AM-PAC OT "6 Clicks" Daily Activity     Outcome Measure Help from another  person eating meals?: None Help from another person taking care of personal grooming?: A Lot Help from another person toileting, which includes using toliet, bedpan, or urinal?: A Lot Help from another person bathing (including washing, rinsing, drying)?: A Lot Help from another person to put on and taking off regular upper body clothing?: A Lot Help from another person to put on and taking off regular lower body clothing?: A Lot 6 Click Score: 14   End of Session    Activity Tolerance: Other (comment)(pt limited by reports of pain at this time and decline trans) Patient left: in bed;with call bell/phone within reach;with bed alarm set  OT Visit Diagnosis: Unsteadiness on feet (R26.81);Other abnormalities of gait and mobility (R26.89);Pain Pain - Right/Left: Left                Time: 2878-6767 OT Time Calculation (min): 44 min Charges:  OT General Charges $OT Visit: 1 Visit OT Evaluation $OT Eval Low Complexity: 1 Low OT Treatments $Self Care/Home Management : 23-37 mins  Joeseph Amor OTR/L  Acute Rehab Services  [phone] office number [phone] pager number   Joeseph Amor 09/10/2019, 12:06 PM

## 2019-09-10 NOTE — Progress Notes (Signed)
PROGRESS NOTE  [name] [name] RCV:[id number] DOB: 10/17/1926   PCP: [name], MD  Patient is from: Home.  Lives with his daughter.  Uses motorized wheelchair at baseline.  DOA: 09/08/2019 LOS: 2  Brief Narrative / Interim history: 86 year old male with history of systolic CHF (EF 10-25% on 08/10/2019) chronic A. fib on Eliquis, CKD-3B, debility with bilateral AKA, HTN and psoriasis presenting with SOB, PND, orthopnea and swelling, and admitted for acute on chronic combined CHF exacerbation.  BNP elevated.  CXR consistent with CHF.  He was started on IV Lasix.   Subjective: Seen in earlier this morning.  No major events overnight morning.  Reports some diarrhea but denies abdominal pain.  Denies chest pain, shortness of breath, nausea, vomiting or UTI symptoms.  Objective: Vitals:   09/10/19 0112 09/10/19 0505 09/10/19 1100 09/10/19 1157  BP: 99/78 102/79 (!) 85/59 115/75  Pulse: 95 66 85 89  Resp: 19 19 16 16   Temp: 98.1 F (36.7 C) 98.4 F (36.9 C)  98 F (36.7 C)  TempSrc: Oral Oral    SpO2: 100% 100%  98%  Weight:  63.5 kg    Height:        Intake/Output Summary (Last 24 hours) at 09/10/2019 1241 Last data filed at 09/10/2019 1120 Gross per 24 hour  Intake 1200 ml  Output 3300 ml  Net -2100 ml   Filed Weights   09/08/19 2051 09/09/19 0420 09/10/19 0505  Weight: 71.7 kg 70.3 kg 63.5 kg    Examination:  GENERAL: No apparent distress.  Nontoxic. HEENT: MMM.  Vision and hearing grossly intact.  NECK: Supple.  No apparent JVD.  RESP: 100% on RA.  No IWOB.  Fair aeration.  Bibasilar crackles. CVS:  RRR. Heart sounds normal.  ABD/GI/GU: BS+. Abd soft, NTND.  MSK/EXT: Bilateral AKA.  Notable edema in AKA stumps. SKIN: Psoriatic skin rash over his head, upper back and lower back NEURO: Awake, alert and oriented fairly.  No apparent focal neuro deficit. PSYCH: Calm. Normal affect.  Fair insight.  Procedures:  None  Microbiology summarized: COVID-19 PCR  negative. MRSA PCR screen positive.  Assessment & Plan: Acute on chronic systolic CHF: cardinal symptoms. BNP and CXR consistent with CHF.  On Lasix 40 mg in the morning and 20 mg in the evening at home.  Not on GDMT due to hypotension.  Good UOP with IV Lasix, 3.5 L / 24-hour, net -6.5 L.  Still with signs of fluid overload. -Continue IV Lasix 40 mg twice daily.  Could be able to transition to p.o. on 5/16 -Continue K. Dur 40 mEq daily. -GDMT-reportedly did not tolerate due to hypotension in the past -Monitor fluid status, renal function and electrolytes -Sodium and fluid restrictions  Permanent A. Fib: Rate controlled without medications.  Per family, metoprolol discontinued due to hypotension.  On Eliquis for anticoagulation. -Continue home Eliquis  CKD stage IIIb: Baseline Cr 1.3-1.4> 1.48 (admit) > 1.53> 1.49. -Closely monitor while on IV diuretics -Avoid nephrotoxic meds  History of PAD s/p bilateral AKA: Has electric wheelchair and therapy for transfer -Continue PT/OT here  Psoriasis:  -Continue topical steroids  4 mm right middle lobe lung nodule:  -Radiology recommends repeat CT chest in 6-8 weeks.  Diarrhea: No signs of infection.  Abdominal exam benign. -As needed Imodium  Goals of care/palliative care: Appropriately DNR/DNI per discussion between admitting provider, patient and family.        Pressure Injury 09/08/19 Buttocks Medial Stage 1 -  Intact skin with non-blanchable  redness of a localized area usually over a bony prominence. (Active)  09/08/19 2100  Location: Buttocks  Location Orientation: Medial  Staging: Stage 1 -  Intact skin with non-blanchable redness of a localized area usually over a bony prominence.  Wound Description (Comments):   Present on Admission: Yes   DVT prophylaxis: On Eliquis for A. fib Code Status: DNR/DNI Family Communication: Updated patient's daughter over the phone. Status is: Inpatient  Remains inpatient appropriate  because:Due to CHF exacerbation requiring IV diuretics   Dispo: The patient is from: Home              Anticipated d/c is to: Home              Anticipated d/c date is: 2 days              Patient currently is not medically stable to d/c.        Consultants:  None   Sch Meds:  Scheduled Meds: . apixaban  5 mg Oral BID  . Chlorhexidine Gluconate Cloth  6 each Topical Q0600  . famotidine  20 mg Oral Daily  . furosemide  40 mg Intravenous Q12H  . gabapentin  200 mg Oral TID  . mupirocin ointment  1 application Nasal BID  . potassium chloride SA  40 mEq Oral Daily  . sodium chloride flush  3 mL Intravenous Q12H  . triamcinolone cream   Topical BID   Continuous Infusions: . sodium chloride     PRN Meds:.sodium chloride, acetaminophen, loperamide, ondansetron (ZOFRAN) IV, sodium chloride flush  Antimicrobials: Anti-infectives (From admission, onward)   None       I have personally reviewed the following labs and images: CBC: Recent Labs  Lab 09/08/19 1143 09/10/19 0534  WBC 3.2* 3.2*  NEUTROABS 2.1  --   HGB 12.9* 12.9*  HCT 40.9 39.1  MCV 104.9* 101.0*  PLT 202 216   BMP &GFR Recent Labs  Lab 09/08/19 1143 09/09/19 0639 09/10/19 0534  NA 139 138 138  K 4.7 3.9 4.3  CL 103 101 97*  CO2 25 28 31   GLUCOSE 99 100* 94  BUN 21 24* 27*  CREATININE 1.48* 1.53* 1.49*  CALCIUM 9.1 8.6* 8.7*  MG  --   --  2.1  PHOS  --   --  3.5   Estimated Creatinine Clearance: 18.1 mL/min (A) (by C-G formula based on SCr of 1.49 mg/dL (H)). Liver & Pancreas: Recent Labs  Lab 09/08/19 1143 09/10/19 0534  AST 30  --   ALT 18  --   ALKPHOS 80  --   BILITOT 1.5*  --   PROT 6.8  --   ALBUMIN 2.9* 2.6*   No results for input(s): LIPASE, AMYLASE in the last 168 hours. No results for input(s): AMMONIA in the last 168 hours. Diabetic: No results for input(s): HGBA1C in the last 72 hours. No results for input(s): GLUCAP in the last 168 hours. Cardiac Enzymes: No  results for input(s): CKTOTAL, CKMB, CKMBINDEX, TROPONINI in the last 168 hours. No results for input(s): PROBNP in the last 8760 hours. Coagulation Profile: No results for input(s): INR, PROTIME in the last 168 hours. Thyroid Function Tests: No results for input(s): TSH, T4TOTAL, FREET4, T3FREE, THYROIDAB in the last 72 hours. Lipid Profile: No results for input(s): CHOL, HDL, LDLCALC, TRIG, CHOLHDL, LDLDIRECT in the last 72 hours. Anemia Panel: No results for input(s): VITAMINB12, FOLATE, FERRITIN, TIBC, IRON, RETICCTPCT in the last 72 hours. Urine analysis:  Component Value Date/Time   COLORURINE YELLOW 03/18/2019 1834   APPEARANCEUR HAZY (A) 03/18/2019 1834   LABSPEC 1.006 03/18/2019 1834   PHURINE 6.0 03/18/2019 1834   GLUCOSEU NEGATIVE 03/18/2019 1834   HGBUR MODERATE (A) 03/18/2019 1834   BILIRUBINUR NEGATIVE 03/18/2019 [place] 03/18/2019 1834   PROTEINUR NEGATIVE 03/18/2019 1834   UROBILINOGEN 0.2 11/16/2012 0345   NITRITE NEGATIVE 03/18/2019 1834   LEUKOCYTESUR LARGE (A) 03/18/2019 1834   Sepsis Labs: Invalid input(s): PROCALCITONIN, [place]  Microbiology: Recent Results (from the past 240 hour(s))  SARS Coronavirus 2 by RT PCR (hospital order, performed in [facility] hospital lab) Nasopharyngeal Nasopharyngeal Swab     Status: None   Collection Time: 09/08/19  3:55 PM   Specimen: Nasopharyngeal Swab  Result Value Ref Range Status   SARS Coronavirus 2 NEGATIVE NEGATIVE Final    Comment: (NOTE) SARS-CoV-2 target nucleic acids are NOT DETECTED. The SARS-CoV-2 RNA is generally detectable in upper and lower respiratory specimens during the acute phase of infection. The lowest concentration of SARS-CoV-2 viral copies this assay can detect is 250 copies / mL. A negative result does not preclude SARS-CoV-2 infection and should not be used as the sole basis for treatment or other patient management decisions.  A negative result may occur  with improper specimen collection / handling, submission of specimen other than nasopharyngeal swab, presence of viral mutation(s) within the areas targeted by this assay, and inadequate number of viral copies (<250 copies / mL). A negative result must be combined with clinical observations, patient history, and epidemiological information. Fact Sheet for Patients:   StrictlyIdeas.no Fact Sheet for Healthcare Providers: BankingDealers.co.za This test is not yet approved or cleared  by the [place] FDA and has been authorized for detection and/or diagnosis of SARS-CoV-2 by FDA under an Emergency Use Authorization (EUA).  This EUA will remain in effect (meaning this test can be used) for the duration of the COVID-19 declaration under Section 564(b)(1) of the Act, 21 U.S.C. section 360bbb-3(b)(1), unless the authorization is terminated or revoked sooner. Performed at [facility], [place] [address], [place], [place] [address]   MRSA PCR Screening     Status: Abnormal   Collection Time: 09/09/19  4:27 AM   Specimen: Nasopharyngeal  Result Value Ref Range Status   MRSA by PCR POSITIVE (A) NEGATIVE Final    Comment:        The GeneXpert MRSA Assay (FDA approved for NASAL specimens only), is one component of a comprehensive MRSA colonization surveillance program. It is not intended to diagnose MRSA infection nor to guide or monitor treatment for MRSA infections. RESULT CALLED TO, READ BACK BY AND VERIFIED WITH: ROBERTS,A RN 09/09/2019 AT 0615 SKEEN,P     Radiology Studies: No results found.    Gerline Ratto T. [place]  If 7PM-7AM, please contact night-coverage [email or web address] Password [facility] 09/10/2019, 12:41 PM

## 2019-09-10 NOTE — Progress Notes (Signed)
Page to Dr Cyndia Skeeters  3e19 [place] BP 85/59, prior to Lasix. will hold unless advised otherwise.

## 2019-09-11 DIAGNOSIS — D72819 Decreased white blood cell count, unspecified: Secondary | ICD-10-CM

## 2019-09-11 LAB — RENAL FUNCTION PANEL
Albumin: 2.5 g/dL — ABNORMAL LOW (ref 3.5–5.0)
Anion gap: 8 (ref 5–15)
BUN: 27 mg/dL — ABNORMAL HIGH (ref 8–23)
CO2: 28 mmol/L (ref 22–32)
Calcium: 8.5 mg/dL — ABNORMAL LOW (ref 8.9–10.3)
Chloride: 100 mmol/L (ref 98–111)
Creatinine, Ser: 1.43 mg/dL — ABNORMAL HIGH (ref 0.61–1.24)
GFR calc Af Amer: 50 mL/min — ABNORMAL LOW (ref >60)
GFR calc non Af Amer: 43 mL/min — ABNORMAL LOW (ref >60)
Glucose, Bld: 93 mg/dL (ref 70–99)
Phosphorus: 3.7 mg/dL (ref 2.5–4.6)
Potassium: 4.4 mmol/L (ref 3.5–5.1)
Sodium: 136 mmol/L (ref 135–145)

## 2019-09-11 LAB — CBC
HCT: 39.2 % (ref 39.0–52.0)
Hemoglobin: 12.7 g/dL — ABNORMAL LOW (ref 13.0–17.0)
MCH: 32.6 pg (ref 26.0–34.0)
MCHC: 32.4 g/dL (ref 30.0–36.0)
MCV: 100.5 fL — ABNORMAL HIGH (ref 80.0–100.0)
Platelets: 222 10*3/uL (ref 150–400)
RBC: 3.9 MIL/uL — ABNORMAL LOW (ref 4.22–5.81)
RDW: 15.9 % — ABNORMAL HIGH (ref 11.5–15.5)
WBC: 3.1 10*3/uL — ABNORMAL LOW (ref 4.0–10.5)
nRBC: 0 % (ref 0.0–0.2)

## 2019-09-11 LAB — MAGNESIUM: Magnesium: 2 mg/dL (ref 1.7–2.4)

## 2019-09-11 MED ORDER — MIDODRINE HCL 2.5 MG PO TABS: 2.5000 mg | ORAL_TABLET | Freq: Three times a day (TID) | ORAL | 1 refills | Status: AC

## 2019-09-11 MED ORDER — LOPERAMIDE HCL 2 MG PO CAPS: 2.0000 mg | ORAL_CAPSULE | ORAL | 0 refills | Status: DC | PRN

## 2019-09-11 MED ORDER — FUROSEMIDE 40 MG PO TABS
40.0000 mg | ORAL_TABLET | Freq: Two times a day (BID) | ORAL | Status: DC
  Administered 2019-09-11 – 2019-09-12 (×3): 40 mg via ORAL
  Filled 2019-09-11 (×3): qty 1

## 2019-09-11 MED ORDER — FUROSEMIDE 40 MG PO TABS: 40.0000 mg | ORAL_TABLET | Freq: Two times a day (BID) | ORAL | 1 refills | Status: AC

## 2019-09-11 MED ORDER — FUROSEMIDE 40 MG PO TABS: 60.0000 mg | ORAL_TABLET | Freq: Two times a day (BID) | ORAL | Status: DC

## 2019-09-11 NOTE — Plan of Care (Signed)
  Problem: Education: Goal: Individualized Educational Video(s) Outcome: Progressing   Problem: Activity: Goal: Capacity to carry out activities will improve Outcome: Adequate for Discharge   Problem: Cardiac: Goal: Ability to achieve and maintain adequate cardiopulmonary perfusion will improve Outcome: Progressing   Problem: Education: Goal: Knowledge of General Education information will improve Description: Including pain rating scale, medication(s)/side effects and non-pharmacologic comfort measures Outcome: Progressing   Problem: Health Behavior/Discharge Planning: Goal: Ability to manage health-related needs will improve Outcome: Progressing   Problem: Clinical Measurements: Goal: Ability to maintain clinical measurements within normal limits will improve Outcome: Progressing Goal: Will remain free from infection Outcome: Progressing Goal: Diagnostic test results will improve Outcome: Progressing Goal: Respiratory complications will improve Outcome: Progressing Goal: Cardiovascular complication will be avoided Outcome: Progressing   Problem: Activity: Goal: Risk for activity intolerance will decrease Outcome: Progressing   Problem: Nutrition: Goal: Adequate nutrition will be maintained Outcome: Progressing   Problem: Coping: Goal: Level of anxiety will decrease Outcome: Progressing   Problem: Elimination: Goal: Will not experience complications related to bowel motility Outcome: Progressing Goal: Will not experience complications related to urinary retention Outcome: Progressing   Problem: Pain Managment: Goal: General experience of comfort will improve Outcome: Progressing   Problem: Safety: Goal: Ability to remain free from injury will improve Outcome: Progressing   Problem: Skin Integrity: Goal: Risk for impaired skin integrity will decrease Outcome: Progressing   Problem: Education: Goal: Knowledge of disease or condition will  improve Outcome: Progressing Goal: Understanding of medication regimen will improve Outcome: Progressing Goal: Individualized Educational Video(s) Outcome: Progressing   Problem: Activity: Goal: Ability to tolerate increased activity will improve Outcome: Progressing   Problem: Cardiac: Goal: Ability to achieve and maintain adequate cardiopulmonary perfusion will improve Outcome: Progressing   Problem: Health Behavior/Discharge Planning: Goal: Ability to safely manage health-related needs after discharge will improve Outcome: Progressing

## 2019-09-11 NOTE — Progress Notes (Signed)
MD notified that pt has no way to get home. Per pt he takes the [employer] bus normally but it does not run on sundays. Pt says his daughter does not have wheel chair access to her vehicle. Will continue to monitor the pt. Hoover Brunette, RN

## 2019-09-11 NOTE — Progress Notes (Signed)
PROGRESS NOTE  [name] [name] ZOX:[id number] DOB: [birth year]   PCP: [name], MD  Patient is from: Home.  Lives with his daughter.  Uses motorized wheelchair at baseline.  DOA: 09/08/2019 LOS: 3  Brief Narrative / Interim history: 86 year old male with history of systolic CHF (EF 81-19% on 08/10/2019) chronic A. fib on Eliquis, CKD-3B, debility with bilateral AKA, HTN and psoriasis presenting with SOB, PND, orthopnea and swelling, and admitted for acute on chronic combined CHF exacerbation.  BNP elevated.  CXR consistent with CHF.   Patient was diuresed with IV Lasix with improvement in his symptoms. He has soft blood pressures. Started on midodrine.  Patient was to be discharged home with [facility] but staying due to lack of transport. He needs transport via a [place] because he has an Clinical research associate here. [facility] cannot transport the WC. the daughter cannot pickup the WC, they are really heavy, she can't get it in her car. there are no WC vans available today.   Subjective: Seen and examined earlier this morning. No major events overnight or this morning. No complaints. He denies chest pain, SOB, GI or UTI symptoms.  Objective: Vitals:   09/11/19 0300 09/11/19 0435 09/11/19 0929 09/11/19 1218  BP:  107/87 95/72 93/77   Pulse:  82 79 78  Resp:  19    Temp:  97.8 F (36.6 C)  98.2 F (36.8 C)  TempSrc:  Oral  Oral  SpO2:  100%  100%  Weight: 69.4 kg     Height:        Intake/Output Summary (Last 24 hours) at 09/11/2019 1314 Last data filed at 09/11/2019 0900 Gross per 24 hour  Intake 1182 ml  Output 1775 ml  Net -593 ml   Filed Weights   09/09/19 0420 09/10/19 0505 09/11/19 0300  Weight: 70.3 kg 63.5 kg 69.4 kg    Examination:  GENERAL: No apparent distress.  Nontoxic. HEENT: MMM.  Vision and hearing grossly intact.  NECK: Supple.  No apparent JVD.  RESP: 100% on RA.  No IWOB.  Fair aeration bilaterally. CVS:  RRR. Heart sounds normal.  ABD/GI/GU: BS+. Abd soft, NTND.    MSK/EXT: Bilateral AKA. SKIN: Psoriatic skin rash over his head, upper back and lower back. NEURO: Awake, alert and oriented fairly..  No apparent focal neuro deficit. PSYCH: Calm. Normal affect.   Procedures:  None  Microbiology summarized: COVID-19 PCR negative. MRSA PCR screen positive.  Assessment & Plan: Acute on chronic systolic CHF: cardinal symptoms. BNP and CXR consistent with CHF.  On Lasix 40 mg in the morning and 20 mg in the evening at home.  Not on GDMT due to hypotension.  Good UOP, 2.3 L / 24 hours, net -7.4 L.  Appears euvolemic now. -Switch to p.o. Lasix 40 mg twice daily -Add midodrine for blood pressure support -Continue K. Dur 40 mEq daily. -GDMT-reportedly did not tolerate due to hypotension -Monitor fluid status, renal function and electrolytes -Sodium and fluid restrictions  Permanent A. Fib: Rate controlled without medications.  Per family, metoprolol discontinued due to hypotension.  On Eliquis for anticoagulation. -Continue home Eliquis  CKD stage IIIb: Baseline Cr 1.3-1.4> 1.48 (admit) > 1.53> 1.49> 1.43. -Closely monitor while on IV diuretics -Avoid nephrotoxic meds  History of PAD s/p bilateral AKA: Has electric wheelchair and therapy for transfer -Continue PT/OT here  Psoriasis:  -Continue topical steroids  4 mm right middle lobe lung nodule:  -Radiology recommends repeat CT chest in 6-8 weeks.  Diarrhea: No signs of  infection.  Abdominal exam benign. -As needed Imodium  Leukopenia: Seems chronic.  Stable. -Continue monitoring  Goals of care/palliative care: Appropriately DNR/DNI per discussion between admitting provider, patient and family.        Pressure Injury 09/08/19 Buttocks Medial Stage 1 -  Intact skin with non-blanchable redness of a localized area usually over a bony prominence. (Active)  09/08/19 2100  Location: Buttocks  Location Orientation: Medial  Staging: Stage 1 -  Intact skin with non-blanchable redness of a  localized area usually over a bony prominence.  Wound Description (Comments):   Present on Admission: Yes   DVT prophylaxis: On Eliquis for A. fib Code Status: DNR/DNI Family Communication: Updated patient's daughter over the phone on 5/15. Status is: Inpatient  Remains inpatient appropriate because: Due to lack of appropriate transportation   Dispo: The patient is from: Home              Anticipated d/c is to: Home              Anticipated d/c date is: 2 days              Patient currently is medically stable to d/c.        Consultants:  None   Sch Meds:  Scheduled Meds: . apixaban  5 mg Oral BID  . Chlorhexidine Gluconate Cloth  6 each Topical Q0600  . famotidine  20 mg Oral Daily  . furosemide  40 mg Oral BID  . gabapentin  200 mg Oral TID  . mupirocin ointment  1 application Nasal BID  . potassium chloride SA  40 mEq Oral Daily  . sodium chloride flush  3 mL Intravenous Q12H  . triamcinolone cream   Topical BID   Continuous Infusions: . sodium chloride     PRN Meds:.sodium chloride, acetaminophen, loperamide, ondansetron (ZOFRAN) IV, sodium chloride flush  Antimicrobials: Anti-infectives (From admission, onward)   None       I have personally reviewed the following labs and images: CBC: Recent Labs  Lab 09/08/19 1143 09/10/19 0534 09/11/19 0333  WBC 3.2* 3.2* 3.1*  NEUTROABS 2.1  --   --   HGB 12.9* 12.9* 12.7*  HCT 40.9 39.1 39.2  MCV 104.9* 101.0* 100.5*  PLT 202 216 222   BMP &GFR Recent Labs  Lab 09/08/19 1143 09/09/19 0639 09/10/19 0534 09/11/19 0333  NA 139 138 138 136  K 4.7 3.9 4.3 4.4  CL 103 101 97* 100  CO2 25 28 31 28   GLUCOSE 99 100* 94 93  BUN 21 24* 27* 27*  CREATININE 1.48* 1.53* 1.49* 1.43*  CALCIUM 9.1 8.6* 8.7* 8.5*  MG  --   --  2.1 2.0  PHOS  --   --  3.5 3.7   Estimated Creatinine Clearance: 20 mL/min (A) (by C-G formula based on SCr of 1.43 mg/dL (H)). Liver & Pancreas: Recent Labs  Lab 09/08/19 1143  09/10/19 0534 09/11/19 0333  AST 30  --   --   ALT 18  --   --   ALKPHOS 80  --   --   BILITOT 1.5*  --   --   PROT 6.8  --   --   ALBUMIN 2.9* 2.6* 2.5*   No results for input(s): LIPASE, AMYLASE in the last 168 hours. No results for input(s): AMMONIA in the last 168 hours. Diabetic: No results for input(s): HGBA1C in the last 72 hours. No results for input(s): GLUCAP in the last 168 hours.  Cardiac Enzymes: No results for input(s): CKTOTAL, CKMB, CKMBINDEX, TROPONINI in the last 168 hours. No results for input(s): PROBNP in the last 8760 hours. Coagulation Profile: No results for input(s): INR, PROTIME in the last 168 hours. Thyroid Function Tests: No results for input(s): TSH, T4TOTAL, FREET4, T3FREE, THYROIDAB in the last 72 hours. Lipid Profile: No results for input(s): CHOL, HDL, LDLCALC, TRIG, CHOLHDL, LDLDIRECT in the last 72 hours. Anemia Panel: No results for input(s): VITAMINB12, FOLATE, FERRITIN, TIBC, IRON, RETICCTPCT in the last 72 hours. Urine analysis:    Component Value Date/Time   COLORURINE YELLOW 03/18/2019 1834   APPEARANCEUR HAZY (A) 03/18/2019 1834   LABSPEC 1.006 03/18/2019 1834   PHURINE 6.0 03/18/2019 1834   GLUCOSEU NEGATIVE 03/18/2019 1834   HGBUR MODERATE (A) 03/18/2019 1834   BILIRUBINUR NEGATIVE 03/18/2019 [place] 03/18/2019 1834   PROTEINUR NEGATIVE 03/18/2019 1834   UROBILINOGEN 0.2 11/16/2012 0345   NITRITE NEGATIVE 03/18/2019 1834   LEUKOCYTESUR LARGE (A) 03/18/2019 1834   Sepsis Labs: Invalid input(s): PROCALCITONIN, [place]  Microbiology: Recent Results (from the past 240 hour(s))  SARS Coronavirus 2 by RT PCR (hospital order, performed in [facility] hospital lab) Nasopharyngeal Nasopharyngeal Swab     Status: None   Collection Time: 09/08/19  3:55 PM   Specimen: Nasopharyngeal Swab  Result Value Ref Range Status   SARS Coronavirus 2 NEGATIVE NEGATIVE Final    Comment: (NOTE) SARS-CoV-2 target nucleic  acids are NOT DETECTED. The SARS-CoV-2 RNA is generally detectable in upper and lower respiratory specimens during the acute phase of infection. The lowest concentration of SARS-CoV-2 viral copies this assay can detect is 250 copies / mL. A negative result does not preclude SARS-CoV-2 infection and should not be used as the sole basis for treatment or other patient management decisions.  A negative result may occur with improper specimen collection / handling, submission of specimen other than nasopharyngeal swab, presence of viral mutation(s) within the areas targeted by this assay, and inadequate number of viral copies (<250 copies / mL). A negative result must be combined with clinical observations, patient history, and epidemiological information. Fact Sheet for Patients:   StrictlyIdeas.no Fact Sheet for Healthcare Providers: BankingDealers.co.za This test is not yet approved or cleared  by the [place] FDA and has been authorized for detection and/or diagnosis of SARS-CoV-2 by FDA under an Emergency Use Authorization (EUA).  This EUA will remain in effect (meaning this test can be used) for the duration of the COVID-19 declaration under Section 564(b)(1) of the Act, 21 U.S.C. section 360bbb-3(b)(1), unless the authorization is terminated or revoked sooner. Performed at [facility], [place] [address], [place], [place] [address]   MRSA PCR Screening     Status: Abnormal   Collection Time: 09/09/19  4:27 AM   Specimen: Nasopharyngeal  Result Value Ref Range Status   MRSA by PCR POSITIVE (A) NEGATIVE Final    Comment:        The GeneXpert MRSA Assay (FDA approved for NASAL specimens only), is one component of a comprehensive MRSA colonization surveillance program. It is not intended to diagnose MRSA infection nor to guide or monitor treatment for MRSA infections. RESULT CALLED TO, READ BACK BY AND VERIFIED  WITH: ROBERTS,A RN 09/09/2019 AT 0615 SKEEN,P     Radiology Studies: No results found.    [place]ollonia Klee T. [place]  If 7PM-7AM, please contact night-coverage [email or web address] Password [email or web address] 09/11/2019, 1:14 PM

## 2019-09-11 NOTE — Progress Notes (Signed)
Patient scheduled for IV lasix.  BP 101/76.  Lasix held and APP notified.

## 2019-09-11 NOTE — TOC Transition Note (Addendum)
Transition of Care [facility]) - CM/SW Discharge Note   Patient Details  Name: [name] [name] MRN: [id number] Date of Birth: [birth year]  Transition of Care [facility]) CM/SW Contact:  Carles Collet, RN Phone Number: 09/11/2019, 10:41 AM   Clinical Narrative:    [name] [name] w patient at bedside, and daughter over the phone. Patient lives with daughter, she is in [place], will be back in [place] after 5pm. Patient will need transport home. His power wheelchair is in his room. He will need transport via a wheelchair Lucianne Lei, as his daughter does not have a way to get his [facility] home if he were to use PTAR and leave it behind for her to pick up.  Patient is active w [facility], they have been notified that he will DC  Unable to secure transportation via [facility], all providers are closed and do not open until Monday.  Notified leadership on call and MD.  [name] [name] w daughter and updated her that discharge will be delayed until tomorrow.  [name] states she will get up early and call for [facility] transport in the AM. [name],[name] Daughter [phone]  [phone]       Final next level of care: Home w Home Health Services Barriers to Discharge: No Barriers Identified   Patient Goals and CMS Choice Patient states their goals for this hospitalization and ongoing recovery are:: to go home CMS Medicare.gov Compare Post Acute Care list provided to:: Patient Choice offered to / list presented to : Patient  Discharge Placement                       Discharge Plan and Services                          HH Arranged: PT, OT [facility] Agency: [place] Date [facility] Agency Contacted: 09/11/19 Time HH Agency Contacted: 11 Representative spoke with at [place]: cindie  Social Determinants of Health ([place]) Interventions     Readmission Risk Interventions No flowsheet data found.

## 2019-09-12 DIAGNOSIS — L89890 Pressure ulcer of other site, unstageable: Secondary | ICD-10-CM

## 2019-09-12 LAB — RENAL FUNCTION PANEL
Albumin: 2.7 g/dL — ABNORMAL LOW (ref 3.5–5.0)
Anion gap: 9 (ref 5–15)
BUN: 29 mg/dL — ABNORMAL HIGH (ref 8–23)
CO2: 28 mmol/L (ref 22–32)
Calcium: 8.8 mg/dL — ABNORMAL LOW (ref 8.9–10.3)
Chloride: 100 mmol/L (ref 98–111)
Creatinine, Ser: 1.65 mg/dL — ABNORMAL HIGH (ref 0.61–1.24)
GFR calc Af Amer: 42 mL/min — ABNORMAL LOW (ref >60)
GFR calc non Af Amer: 36 mL/min — ABNORMAL LOW (ref >60)
Glucose, Bld: 88 mg/dL (ref 70–99)
Phosphorus: 3.8 mg/dL (ref 2.5–4.6)
Potassium: 4.8 mmol/L (ref 3.5–5.1)
Sodium: 137 mmol/L (ref 135–145)

## 2019-09-12 LAB — CBC
HCT: 39.5 % (ref 39.0–52.0)
Hemoglobin: 12.9 g/dL — ABNORMAL LOW (ref 13.0–17.0)
MCH: 33.4 pg (ref 26.0–34.0)
MCHC: 32.7 g/dL (ref 30.0–36.0)
MCV: 102.3 fL — ABNORMAL HIGH (ref 80.0–100.0)
Platelets: 234 10*3/uL (ref 150–400)
RBC: 3.86 MIL/uL — ABNORMAL LOW (ref 4.22–5.81)
RDW: 16.1 % — ABNORMAL HIGH (ref 11.5–15.5)
WBC: 3.6 10*3/uL — ABNORMAL LOW (ref 4.0–10.5)
nRBC: 0 % (ref 0.0–0.2)

## 2019-09-12 LAB — MAGNESIUM: Magnesium: 2.3 mg/dL (ref 1.7–2.4)

## 2019-09-12 NOTE — Progress Notes (Signed)
PT Cancellation Note  Patient Details Name: [name] [name] MRN: [id number] DOB: 07-Aug-1926   Cancelled Treatment:    Reason Eval/Treat Not Completed: Other (comment).  Pt is up in chair and expecting to leave imminently.  Will try again tomorrow if dc is held for some reason.   Ramond Dial 09/12/2019, 11:17 AM  Mee Hives, PT MS Acute Rehab Dept. Number: [place]

## 2019-09-12 NOTE — Discharge Summary (Signed)
Physician Discharge Summary  [name] [name] [id number] DOB: [birth year] DOA: 09/08/2019  PCP: [name], MD  Admit date: 09/08/2019 Discharge date: 09/12/2019  Admitted From: Home Disposition: Home  Recommendations for Outpatient Follow-up:  1. Follow ups as below. 2. Please obtain CBC/BMP/Mag at follow up 3. Please follow up on the following pending results: None  Home Health: PT/OT Equipment/Devices: Patient has appropriate DME.  Discharge Condition: Stable CODE STATUS: DNR/DNI  Follow-up Information    [facility] Follow up.   Specialty: [place] Why: they will contact you to schedule a home appointment  Contact information: [place] [address] [place] [address] [phone]        [facility]. Go on 09/30/2019.   Why: @2 :30pm Contact information: [place] [id number] [facility] Course: 86 year old male with history of systolic CHF (EF 44-81% on 08/10/2019) chronic A. fib on Eliquis, CKD-3B, debility with bilateral AKA, HTN and psoriasis presenting with SOB, PND, orthopnea and swelling, and admitted for acute on chronic combined CHF exacerbation.  BNP elevated.  CXR consistent with CHF.   Patient was diuresed with IV Lasix with improvement in his symptoms. He has soft blood pressures. Started on midodrine.  Transition to p.o. Lasix.  Discharged on p.o. Lasix 40 mg twice daily and midodrine  Home health PT/OT ordered as recommended by therapy.  Discharge Diagnoses:  Acute on chronic systolic CHF: cardinal symptoms. BNP and CXR consistent with CHF. Not on GDMT due to hypotension.    2.1 L UOP/24 hours on p.o. Lasix 40 mg twice daily.  Net -8.4 L.  Renal function, BP and electrolytes stable. -Discharged on p.o. Lasix 40 mg twice daily -Continue midodrine for blood pressure support -Continue K. Dur 40 mEq daily. -GDMT-reportedly did not tolerate  due to hypotension -Counseled on sodium and fluid restrictions -Outpatient follow-up with PCP and cardiology in 1 to 2 weeks  Permanent A. Fib: Rate controlled without medications.  Per family, metoprolol discontinued due to hypotension.  On Eliquis for anticoagulation. -Continue home Eliquis  CKD stage IIIb: Baseline Cr 1.3-1.4> 1.48 (admit) > 1.53> 1.49> 1.43. -Recheck BMP and magnesium in 1 week.  History of PAD s/p bilateral AKA: Has electric wheelchair and therapy for transfer -Continue PT/OT here  Psoriasis:  -Continue topical steroids  4 mm right middle lobe lung nodule: -Radiology recommends repeat CT chest in 6-8 weeks.  Diarrhea: No signs of infection.  Abdominal exam benign. -As needed Imodium  Leukopenia: Seems chronic.    Improving. -Recheck CBC in 1 week  Goals of care/palliative care: Appropriately DNR/DNI per discussion between admitting provider, patient and family.             Pressure Injury 09/08/19 Buttocks Medial Stage 1 -  Intact skin with non-blanchable redness of a localized area usually over a bony prominence. (Active)  09/08/19 2100  Location: Buttocks  Location Orientation: Medial  Staging: Stage 1 -  Intact skin with non-blanchable redness of a localized area usually over a bony prominence.  Wound Description (Comments):   Present on Admission: Yes    Discharge Exam: Vitals:   09/11/19 2047 09/12/19 0316  BP: 100/63 102/74  Pulse: 90 89  Resp: 19 19  Temp: 98.6 F (37 C) 98.9 F (37.2 C)  SpO2: 97% 100%    GENERAL: No apparent distress.  Nontoxic. HEENT: MMM.  Vision and hearing grossly intact.  NECK:  Supple.  No apparent JVD.  RESP: On room air.  No IWOB.  Fair aeration bilaterally. CVS:  RRR. Heart sounds normal.  ABD/GI/GU: Bowel sounds present. Soft. Non tender.  MSK/EXT: Bilateral AKA SKIN: Psoriatic skin lesion over his scalp, upper back and lower back NEURO: Awake, alert and oriented appropriately.  No  apparent focal neuro deficit. PSYCH: Calm. Normal affect.  Discharge Instructions  Discharge Instructions    (HEART FAILURE PATIENTS) Call MD:  Anytime you have any of the following symptoms: 1) 3 pound weight gain in 24 hours or 5 pounds in 1 week 2) shortness of breath, with or without a dry hacking cough 3) swelling in the hands, feet or stomach 4) if you have to sleep on extra pillows at night in order to breathe.   Complete by: As directed    Call MD for:  difficulty breathing, headache or visual disturbances   Complete by: As directed    Call MD for:  extreme fatigue   Complete by: As directed    Call MD for:  persistant dizziness or light-headedness   Complete by: As directed    Diet - low sodium heart healthy   Complete by: As directed    Discharge instructions   Complete by: As directed    It has been a pleasure taking care of you! You were hospitalized and treated for congestive heart failure exacerbation.  We are discharging you on Lasix (furosemide) that you need to continue taking once you leave the hospital.  We may have made some adjustments to your home medications during this hospitalization. Please review your new medication list and the directions before you take your medications.  In addition to taking your medications as prescribed, it is very important that you avoid alcohol or over-the-counter pain medication other than plain Tylenol, limit the amount of water/fluid you drink to less than 6 cups (1500 cc) a day,  limit your sodium (salt) intake to less than 2 g (2000 mg) a day, and weigh yourself daily at the same time and keeping your weight log.   Take care,   Increase activity slowly   Complete by: As directed      Allergies as of 09/12/2019   No Known Allergies     Medication List    TAKE these medications   Eliquis 5 MG Tabs tablet Generic drug: apixaban TAKE 1 TABLET BY MOUTH TWICE DAILY What changed: how much to take   famotidine 20 MG  tablet Commonly known as: Pepcid Take 1 tablet (20 mg total) by mouth 2 (two) times daily. To reduce stomach acid   fluocinolone 0.025 % cream Commonly known as: SYNALAR Apply topically 2 (two) times daily. To the scalp and sparingly to the face What changed: how much to take   furosemide 40 MG tablet Commonly known as: LASIX Take 1 tablet (40 mg total) by mouth 2 (two) times daily. What changed: additional instructions   gabapentin 100 MG capsule Commonly known as: NEURONTIN Take 1 capsule (100 mg total) by mouth See admin instructions. Take 200mg  in the morning, 200mg  every evening and 300mg  at bedtime.   hydrocortisone cream 1 % Apply topically 2 (two) times daily.   loperamide 2 MG capsule Commonly known as: IMODIUM Take 1 capsule (2 mg total) by mouth as needed for diarrhea or loose stools. Do not take more than 6 in a day   midodrine 2.5 MG tablet Commonly known as: PROAMATINE Take 1 tablet (2.5 mg total) by mouth  3 (three) times daily with meals.   Potassium Chloride ER 20 MEQ Tbcr Take 20 mEq by mouth daily.   silver sulfADIAZINE 1 % cream Commonly known as: Silvadene Apply 1 application topically daily. Once daily to area of skin irritation on the right stump x 10 days then as needed   triamcinolone ointment 0.1 % Commonly known as: KENALOG Apply 1 application topically 2 (two) times daily as needed (skin irritation.).       Consultations:  None  Procedures/Studies:   DG Chest 2 View  Result Date: 09/08/2019 CLINICAL DATA:  86 year old male with history of shortness of breath for 1 week. EXAM: CHEST - 2 VIEW COMPARISON:  Chest x-ray 08/09/2019. FINDINGS: Bibasilar opacities (left greater than right) which may reflect areas of atelectasis and/or consolidation, although this is likely accentuated by under penetration of the image. Small left pleural effusion. No right pleural effusion. There is cephalization of the pulmonary vasculature and slight  indistinctness of the interstitial markings suggestive of mild pulmonary edema. Mild cardiomegaly. Upper mediastinal contours are distorted by patient positioning. IMPRESSION: 1. The appearance the chest suggests congestive heart failure, as above. Electronically Signed   By: Vinnie Langton M.D.   On: 09/08/2019 11:21   CT Abdomen Pelvis W Contrast  Result Date: 09/08/2019 CLINICAL DATA:  Diverticulitis suspected, shortness of breath EXAM: CT ABDOMEN AND PELVIS WITH CONTRAST TECHNIQUE: Multidetector CT imaging of the abdomen and pelvis was performed using the standard protocol following bolus administration of intravenous contrast. CONTRAST:  132mL OMNIPAQUE IOHEXOL 300 MG/ML  SOLN COMPARISON:  Renal ultrasound 08/07/2012 FINDINGS: Lower chest: Moderate bilateral pleural effusions with adjacent areas of atelectasis. Some associated basilar airways thickening is noted as well as patchy ground-glass in the right middle lobe. Additional 4 mm solid nodule favored to be infectious or inflammatory seen in the right middle lobe. Cardiomegaly without pericardial effusion. Three-vessel coronary artery disease is noted. Hepatobiliary: Diffuse hepatic hypoattenuation compatible with hepatic steatosis. No focal liver lesion. Smooth liver surface contour. Hyperdense material layering towards the gallbladder neck may reflect some calcified biliary sand/gallstones or biliary sludge. No visible intraductal gallstones. No biliary ductal dilatation. Pancreas: Unremarkable. No pancreatic ductal dilatation or surrounding inflammatory changes. Spleen: Slightly heterogeneous enhancement of the spleen likely related to the timing of contrast media resulting in a late arterial phase. Splenic enhancement normalize on delayed imaging. No concerning splenic lesions. Normal splenic size. Adrenals/Urinary Tract: Lobular thickening of the adrenal glands without concerning dominant nodule may reflect some senescent adrenal hyperplasia.  Kidneys enhance and excrete symmetrically. Scattered subcentimeter hypoattenuating foci in both kidneys too small to fully characterize on CT imaging but statistically likely benign. Indeterminate 2.5 cm lesion seen in the upper pole left kidney, incompletely characterized on this exam. No obstructive urolithiasis or hydronephrosis. Circumferential bladder wall thickening is noted. Indentation of bladder base by the enlarged prostate. Stomach/Bowel: Distal esophagus, stomach and duodenal sweep are unremarkable. No small bowel wall thickening or dilatation. No evidence of obstruction. A normal appendix is visualized. Portion of the sigmoid colon protrudes into a left inguinal hernia sac without convincing evidence of mechanical obstruction or features of bowel vascular compromise. Slight asymmetric thickening is noted at the rectosigmoid along the right lateral wall, possibly related to underdistention though incompletely assessed. No other colonic thickening or dilatation. Vascular/Lymphatic: Atherosclerotic plaque throughout the abdominal aorta and branch vessels with some moderate narrowing at the ostium the celiac axis and more mild ostial narrowing the origins of the renal arteries. Severe narrowing of the IMA  origin. Fusiform ectasia of left internal iliac up to 11 mm. Major venous structures are unremarkable. No suspicious or enlarged lymph nodes in the included lymphatic chains. Reproductive: Prostatomegaly. No concerning prostate or seminal vesicular lesions. Other: Diffuse body wall edema and edematous changes in the mesentery with small volume ascites. Additional fluid seen within the left hernia sac is likely redistributed. Additional trace fluid in a fat containing right inguinal hernia as well. No abdominopelvic free air. Musculoskeletal: Superior endplate compression deformity at L1 is age indeterminate with some sclerotic features which could suggest a subacute nature. 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 Bony pelvis is intact and congruent with degenerative changes of the SI joints, pubic symphysis and bilateral hips. IMPRESSION: 1. Diffuse features of volume overload/anasarca with pleural effusions, body wall edema, mesenteric edema and small volume ascites. 2. Cardiomegaly with with some evidence of right heart failure or elevated right heart pressures given reflux of contrast into the hepatic veins. 3. Basilar airways thickening as well as patchy ground-glass in the right middle lobe. Findings may represent an infectious/inflammatory process or sequela of aspiration. 4 mm nodule in the right middle lobe favored to be infectious/inflammatory. Recommend follow-up chest CT in 6-8 weeks following resolution of the patient's acute symptoms. 4. Slight asymmetric thickening at the right lateral wall of the rectosigmoid, possibly related to underdistention though incompletely assessed. Could reflect a proctitis though underlying malignancy is not excluded. Recommend correlation with recent colonoscopy or direct visualization. 5. Left inguinal hernia sac contains a portion of the sigmoid colon without convincing evidence of mechanical obstruction or features of bowel vascular compromise. 6. Hepatic steatosis. 7. Hyperdense material layering towards the gallbladder neck may reflect some calcified biliary sand/gallstones or biliary sludge. No other CT findings of acute cholecystitis though recommend assessment with clinical exam findings and consideration of dedicated right upper quadrant ultrasound. 8. Indeterminate 2.5 cm lesion in the upper pole left kidney, incompletely characterized on this exam. Recommend further evaluation with nonemergent renal ultrasound. 9. Superior endplate compression deformity at L1 is age indeterminate with some sclerotic  features which could suggest a subacute nature. Correlate for point tenderness. 10. Three-vessel coronary artery disease. Coronary artery calcifications are present. Please note that the presence of coronary artery calcium documents the presence of coronary artery disease, the severity of this disease and any potential stenosis cannot be assessed on this non-gated CT examination. Assessment for potential risk factor modification, dietary therapy or pharmacologic therapy may be warranted. 11. Aortic Atherosclerosis (ICD10-I70.0). Electronically Signed   By: Lovena Le M.D.   On: 09/08/2019 16:13        The results of significant diagnostics from this hospitalization (including imaging, microbiology, ancillary and laboratory) are listed below for reference.     Microbiology: Recent Results (from the past 240 hour(s))  SARS Coronavirus 2 by RT PCR (hospital order, performed in [facility] hospital lab) Nasopharyngeal Nasopharyngeal Swab     Status: None   Collection Time: 09/08/19  3:55 PM   Specimen: Nasopharyngeal Swab  Result Value Ref Range Status   SARS Coronavirus 2 NEGATIVE NEGATIVE Final    Comment: (NOTE) SARS-CoV-2 target nucleic acids are NOT DETECTED. The SARS-CoV-2 RNA is generally detectable in upper and lower respiratory specimens during the acute phase of infection. The lowest concentration of SARS-CoV-2 viral copies this assay can detect is 250 copies / mL. A negative result does not preclude SARS-CoV-2 infection  and should not be used as the sole basis for treatment or other patient management decisions.  A negative result may occur with improper specimen collection / handling, submission of specimen other than nasopharyngeal swab, presence of viral mutation(s) within the areas targeted by this assay, and inadequate number of viral copies (<250 copies / mL). A negative result must be combined with clinical observations, patient history, and epidemiological  information. Fact Sheet for Patients:   StrictlyIdeas.no Fact Sheet for Healthcare Providers: BankingDealers.co.za This test is not yet approved or cleared  by the [place] FDA and has been authorized for detection and/or diagnosis of SARS-CoV-2 by FDA under an Emergency Use Authorization (EUA).  This EUA will remain in effect (meaning this test can be used) for the duration of the COVID-19 declaration under Section 564(b)(1) of the Act, 21 U.S.C. section 360bbb-3(b)(1), unless the authorization is terminated or revoked sooner. Performed at [facility], [place] [address], [place], [place] [address]   MRSA PCR Screening     Status: Abnormal   Collection Time: 09/09/19  4:27 AM   Specimen: Nasopharyngeal  Result Value Ref Range Status   MRSA by PCR POSITIVE (A) NEGATIVE Final    Comment:        The GeneXpert MRSA Assay (FDA approved for NASAL specimens only), is one component of a comprehensive MRSA colonization surveillance program. It is not intended to diagnose MRSA infection nor to guide or monitor treatment for MRSA infections. RESULT CALLED TO, READ BACK BY AND VERIFIED WITH: ROBERTS,A RN 09/09/2019 AT 0615 SKEEN,P      Labs: BNP (last 3 results) Recent Labs    03/22/19 1430 08/09/19 1543 09/08/19 1143  BNP 1,887.8* 2,983.7* 3,220.2*   Basic Metabolic Panel: Recent Labs  Lab 09/08/19 1143 09/09/19 0639 09/10/19 0534 09/11/19 0333 09/12/19 0643  NA 139 138 138 136 137  K 4.7 3.9 4.3 4.4 4.8  CL 103 101 97* 100 100  CO2 25 28 31 28 28   GLUCOSE 99 100* 94 93 88  BUN 21 24* 27* 27* 29*  CREATININE 1.48* 1.53* 1.49* 1.43* 1.65*  CALCIUM 9.1 8.6* 8.7* 8.5* 8.8*  MG  --   --  2.1 2.0 2.3  PHOS  --   --  3.5 3.7 3.8   Liver Function Tests: Recent Labs  Lab 09/08/19 1143 09/10/19 0534 09/11/19 0333 09/12/19 0643  AST 30  --   --   --   ALT 18  --   --   --   ALKPHOS 80  --   --   --   BILITOT  1.5*  --   --   --   PROT 6.8  --   --   --   ALBUMIN 2.9* 2.6* 2.5* 2.7*   No results for input(s): LIPASE, AMYLASE in the last 168 hours. No results for input(s): AMMONIA in the last 168 hours. CBC: Recent Labs  Lab 09/08/19 1143 09/10/19 0534 09/11/19 0333 09/12/19 0643  WBC 3.2* 3.2* 3.1* 3.6*  NEUTROABS 2.1  --   --   --   HGB 12.9* 12.9* 12.7* 12.9*  HCT 40.9 39.1 39.2 39.5  MCV 104.9* 101.0* 100.5* 102.3*  PLT 202 216 222 234   Cardiac Enzymes: No results for input(s): CKTOTAL, CKMB, CKMBINDEX, TROPONINI in the last 168 hours. BNP: Invalid input(s): POCBNP CBG: No results for input(s): GLUCAP in the last 168 hours. D-Dimer No results for input(s): DDIMER in the last 72 hours. Hgb A1c No results  for input(s): HGBA1C in the last 72 hours. Lipid Profile No results for input(s): CHOL, HDL, LDLCALC, TRIG, CHOLHDL, LDLDIRECT in the last 72 hours. Thyroid function studies No results for input(s): TSH, T4TOTAL, T3FREE, THYROIDAB in the last 72 hours.  Invalid input(s): FREET3 Anemia work up No results for input(s): VITAMINB12, FOLATE, FERRITIN, TIBC, IRON, RETICCTPCT in the last 72 hours. Urinalysis    Component Value Date/Time   COLORURINE YELLOW 03/18/2019 1834   APPEARANCEUR HAZY (A) 03/18/2019 1834   LABSPEC 1.006 03/18/2019 1834   PHURINE 6.0 03/18/2019 1834   GLUCOSEU NEGATIVE 03/18/2019 1834   HGBUR MODERATE (A) 03/18/2019 1834   BILIRUBINUR NEGATIVE 03/18/2019 [place] 03/18/2019 1834   PROTEINUR NEGATIVE 03/18/2019 1834   UROBILINOGEN 0.2 11/16/2012 0345   NITRITE NEGATIVE 03/18/2019 1834   LEUKOCYTESUR LARGE (A) 03/18/2019 1834   Sepsis Labs Invalid input(s): PROCALCITONIN,  WBC,  LACTICIDVEN   Time coordinating discharge: 40 minutes  SIGNED:  Mercy Riding, MD  [facility] 09/12/2019, 3:16 PM  If 7PM-7AM, please contact night-coverage [email or web address] Password [email or web address]

## 2019-09-12 NOTE — Care Management Important Message (Signed)
Important Message  Patient Details  Name: [name] [name] MRN: [id number] Date of Birth: [birth year]   Medicare Important Message Given:  Yes     Shelda Altes 09/12/2019, 10:39 AM

## 2019-09-12 NOTE — TOC Transition Note (Addendum)
Transition of Care [facility]) - CM/SW Discharge Note   Patient Details  Name: [name] [name] MRN: [id number] Date of Birth: 01-Mar-1927  Transition of [facility]) CM/SW Contact:  Alberteen Sam, LCSW Phone Number: 09/12/2019, 9:20 AM   Clinical Narrative:     Update: Daughter called and stated she cancelled transportation and arranged her own that will be arriving 12:30, stated they will call front desk when they arrive. RN notified.   Patient is set up with [facility] medical transport to arrive at main entrance at 9:30 am, daughter Sondra Barges to pay for this and [facility] is calling her to get card information. RN and MD informed. CSW has informed Tommi Rumps with [facility] of patient's discharge today to resume home health services.   No other dc needs identified at this time.    Final next level of care: [place] Barriers to Discharge: No Barriers Identified   Patient Goals and CMS Choice Patient states their goals for this hospitalization and ongoing recovery are:: to go home CMS Medicare.gov Compare Post Acute Care list provided to:: Patient Choice offered to / list presented to : Patient  Discharge Placement                Patient to be transferred to facility by: [place] Name of family member notified: Tamie Patient and family notified of of transfer: 09/12/19  Discharge Plan and Services                          HH Arranged: PT, OT, RN [facility] Agency: [place] Date [place]: 09/12/19 Time [place]: [phone] Representative spoke with at [place]: [place] ([place]) Interventions     Readmission Risk Interventions No flowsheet data found.

## 2019-09-12 NOTE — Progress Notes (Signed)
Pt refused morning labs. Will try again later.

## 2019-09-12 NOTE — TOC Progression Note (Signed)
Transition of Care [facility]) - Progression Note    Patient Details  Name: [name] [name] MRN: [id number] Date of Birth: May 26, 1926  Transition of Care [facility]) CM/SW [place], [place] Phone Number: 09/12/2019, 8:28 AM  Clinical Narrative:      CSW spoke with patient's daughter [name], she states [facility] will not transport same day, however she is willing to pay for [facility] medical transport.   CSW called [facility] they report no availability today, CSW requested to speak with director of operations. Pending call back from director of operations.    Barriers to Discharge: No Barriers Identified  Expected Discharge Plan and Services           Expected Discharge Date: 09/12/19                         HH Arranged: PT, OT HH Agency: [place] Date [facility] Agency Contacted: 09/11/19 Time HH Agency Contacted: 26 Representative spoke with at [place]: [email or web address]   Social Determinants of Health ([place]) Interventions    Readmission Risk Interventions No flowsheet data found.

## 2019-09-13 ENCOUNTER — Telehealth: Payer: Self-pay

## 2019-09-13 NOTE — Telephone Encounter (Signed)
Transition Care Management Follow-up Telephone Call Date of discharge and from where: 09/12/2019 , [facility]   Call placed to patient # [phone], message left with call back requested to this CM .   He has an appointment wit Dr Chapman Fitch on 09/30/2019, but we can schedule an appointment sooner

## 2019-09-14 ENCOUNTER — Telehealth: Payer: Self-pay | Admitting: Family Medicine

## 2019-09-14 ENCOUNTER — Telehealth: Payer: Self-pay

## 2019-09-14 NOTE — Telephone Encounter (Signed)
Transition Care Management Follow-up Telephone Call  Attempt #2 Date of discharge and from where: 09/12/2019 , [facility]   Call placed to patient # [phone], message left with call back requested to this CM .   He has an appointment with Dr Chapman Fitch on 09/30/2019, but we can schedule an appointment sooner

## 2019-09-14 NOTE — Telephone Encounter (Signed)
Beth from [place] is requesting a verbal to go to patient house to do a hospital f/u. Verbal was given from Dr. Chapman Fitch to allow her to go to pt house tomorrow 09-14-2019

## 2019-09-14 NOTE — Telephone Encounter (Signed)
Beth from [place] called requesting to know if it's okay to visit the patient tomorrow because the patient said he could not have the visit today. Please f/u

## 2019-09-15 NOTE — Telephone Encounter (Signed)
Please contact patient's guardian/daughter to see if she is aware of patient's refusal

## 2019-09-15 NOTE — Telephone Encounter (Signed)
Beth from [place] called saying that patient was discharged from the hospital earlier this week and patient is refusing services. Patient is being discharged.

## 2019-09-21 ENCOUNTER — Telehealth: Payer: Self-pay

## 2019-09-21 NOTE — Telephone Encounter (Signed)
Call placed to patient's daughter at request of Dr Fulp to inform her that the patient declined home health services.   She said she was aware and was with him when he met with the home health staff member and she respects his wishes.  She also confirmed that he will be at his appointment with Dr Fulp 09/30/2019 

## 2019-09-22 ENCOUNTER — Ambulatory Visit: Payer: Medicare Other | Admitting: Medical

## 2019-09-30 ENCOUNTER — Telehealth: Payer: Self-pay

## 2019-09-30 ENCOUNTER — Ambulatory Visit: Payer: Medicare Other | Admitting: Family Medicine

## 2019-09-30 NOTE — Telephone Encounter (Signed)
Pt request refill Gabapentin. Pt came for appt with Fulp. appt was rescheduled to 10/13/19 due to provider out of office. Pt uses [facility], materials.

## 2019-10-04 ENCOUNTER — Ambulatory Visit: Payer: Medicare Other | Admitting: Medical

## 2019-10-05 ENCOUNTER — Other Ambulatory Visit: Payer: Self-pay | Admitting: Internal Medicine

## 2019-10-05 DIAGNOSIS — M792 Neuralgia and neuritis, unspecified: Secondary | ICD-10-CM

## 2019-10-05 MED ORDER — GABAPENTIN 100 MG PO CAPS: 100.0000 mg | ORAL_CAPSULE | ORAL | 0 refills | Status: DC

## 2019-10-05 NOTE — Telephone Encounter (Signed)
Refill sent.   Phill Myron, D.O. Primary Care at [facility]  10/05/2019, 10:23 AM

## 2019-10-12 NOTE — Progress Notes (Signed)
Patient ID: [name] [name], male   DOB: [birth year], 87 y.o.   MRN: [id number]     [name] [name], is a 86 y.o. male  GTX:[id number]  YQM:[id number]  DOB - [birth year]  Subjective:  Chief Complaint and HPI: [name] [name] is a 86 y.o. male here today for a follow up visit After hospitalization 5/13-5/17/2021 for CHF.    From discharge summary: Hospital Course: 86 year old male with history of systolic CHF (EF 88-91% on 08/10/2019) chronic A. fib on Eliquis, CKD-3B, debility with bilateral AKA, HTN and psoriasis presenting with SOB, PND, orthopnea and swelling, and admitted for acute on chronic combined CHF exacerbation. BNP elevated. CXR consistent with CHF.  Patient was diuresed with IVLasix with improvement in his symptoms. He has soft blood pressures. Started on midodrine.  Transition to p.o. Lasix.  Discharged on p.o. Lasix 40 mg twice daily and midodrine  Home health PT/OT ordered as recommended by therapy.  Discharge Diagnoses:  Acute on chronic systolic CHF: cardinal symptoms. BNP and CXR consistent with CHF.Not on GDMT due to hypotension.   2.1 L UOP/24 hours on p.o. Lasix 40 mg twice daily.  Net -8.4 L.  Renal function, BP and electrolytes stable. -Discharged on p.o. Lasix 40 mg twice daily -Continue midodrine for blood pressure support -Continue K. Dur 40 mEq daily. -GDMT-reportedly did not tolerate due to hypotension -Counseled on sodium and fluid restrictions -Outpatient follow-up with PCP and cardiology in 1 to 2 weeks  Permanent A. Fib: Rate controlled without medications. Per family, metoprolol discontinued due to hypotension. On Eliquis for anticoagulation. -Continue home Eliquis  CKD stage IIIb: Baseline Cr 1.3-1.4> 1.48 (admit) > 1.53> 1.49>1.43. -Recheck BMP and magnesium in 1 week.  History of PAD s/p bilateral AKA: Has electric wheelchair and therapy for transfer -Continue PT/OT here  Psoriasis:  -Continue topical steroids  4 mm right middle lobe lung  nodule: -Radiology recommends repeat CT chest in 6-8 weeks.  Diarrhea: No signs of infection. Abdominal exam benign. -As needed Imodium  Leukopenia: Seems chronic.   Improving. -Recheck CBC in 1 week  Goals of care/palliative care: Appropriately DNR/DNI per discussion between admitting provider, patient and family.    Hospital notes reviewed and summarized above.   ROS:  No new complaints as far as the below ROS Constitutional:  No f/c, No night sweats, No unexplained weight loss. EENT:  No vision changes, No blurry vision, No hearing changes. No mouth, throat, or ear problems.  Respiratory: No cough, No SOB Cardiac: No CP, no palpitations GI:  No abd pain, No N/V/D. GU: No Urinary s/sx Musculoskeletal: No joint pain Neuro: No headache, no dizziness, no motor weakness.  Skin: No rash Endocrine:  No polydipsia. No polyuria.  Psych: Denies SI/HI  No problems updated.  ALLERGIES: No Known Allergies  PAST MEDICAL HISTORY: Past Medical History:  Diagnosis Date   Chronic kidney disease    RENAL INSUFICCIENCY   Diabetes mellitus without complication ([name] [name])    DVT (deep venous thrombosis) ([name] [name])    Gout    Hypertension    Lymphadenitis 08/10/2012   Psoriasis     MEDICATIONS AT HOME: Prior to Admission medications   Medication Sig Start Date End Date Taking? Authorizing Provider  ELIQUIS 5 MG TABS tablet TAKE 1 TABLET BY MOUTH TWICE DAILY Patient taking differently: Take 5 mg by mouth 2 (two) times daily.  07/25/19  Yes [name], Cammie, MD  famotidine (PEPCID) 20 MG tablet Take 1 tablet (20 mg total) by mouth 2 (two) times daily. To reduce  stomach acid 08/26/19  Yes [name], Cammie, MD  fluocinolone (SYNALAR) 0.025 % cream Apply topically 2 (two) times daily. To the scalp and sparingly to the face 10/13/19  Yes Diahn Waidelich, [name] Bucy, PA-C  furosemide (LASIX) 40 MG tablet Take 1 tablet (40 mg total) by mouth 2 (two) times daily. 09/11/19  Yes [name] Riding, MD  gabapentin  (NEURONTIN) 100 MG capsule Take 1 capsule (100 mg total) by mouth See admin instructions. Take 200mg  in the morning, 200mg  every evening and 300mg  at bedtime. 10/13/19 11/12/19 Yes [name] [name], [name] Bucy, PA-C  hydrocortisone cream 1 % Apply topically 2 (two) times daily. 11/25/18  Yes [name] Coss, MD  loperamide (IMODIUM) 2 MG capsule Take 1 capsule (2 mg total) by mouth as needed for diarrhea or loose stools. Do not take more than 6 in a day 10/13/19  Yes [name] [name], [name] Bucy, PA-C  midodrine (PROAMATINE) 2.5 MG tablet Take 1 tablet (2.5 mg total) by mouth 3 (three) times daily with meals. 09/11/19  Yes [name] Riding, MD  triamcinolone ointment (KENALOG) 0.1 % Apply 1 application topically 2 (two) times daily as needed (skin irritation.).  07/27/19  Yes [provider]  Potassium Chloride ER 20 MEQ TBCR Take 20 mEq by mouth daily. 08/12/19 09/11/19  [name] Merino, MD  silver sulfADIAZINE (SILVADENE) 1 % cream Apply 1 application topically daily. Once daily to area of skin irritation on the right stump x 10 days then as needed Patient not taking: Reported on 09/08/2019 05/27/19   [name] Blackbird, MD     Objective:  EXAM:   Vitals:   10/13/19 1353  BP: 107/67  Pulse: 96  Temp: 97.7 F (36.5 C)  TempSrc: Temporal  SpO2: 100%    General appearance : A&OX3. NAD. Non-toxic-appearing;  In a wheel chair, B AKA.  Appears to be in poor health per advanced age 37: Atraumatic and Normocephalic.  PERRLA. EOM intact.   Chest/Lungs:  Breathing-non-labored, Good air entry bilaterally, breath sounds normal without rales, rhonchi, or wheezing  CVS: S1 S2 regular, no murmurs, gallops, rubs  Neurology:  CN II-XII grossly intact, Non focal.   Psych:  TP tangential. J/I fair to poor. Garbled then clear speech at times. Skin:  Moderate to severe psoriasis plaques  Data Review Lab Results  Component Value Date   HGBA1C 5.8 (H) 08/09/2019   HGBA1C 6.1 05/27/2019   HGBA1C 5.8 (H) 10/25/2018      Assessment & Plan   1. Chronic diastolic CHF (congestive heart failure) (HCC) stable-Continue current regimen  2. Hospital discharge follow-up Doing well for poor overall health/advanced age  38. Chronic renal insufficiency, stage 3 (moderate) - Comprehensive metabolic panel; Future - CBC with Differential/Platelet; Future  4. Type 2 diabetes mellitus with other circulatory complication, without long-term current use of insulin (HCC) Diet controlled-A1C-5.8 2 months ago  5. Lung nodule < 6cm on CT Incidental finding - CT Chest W Contrast; Future  6. Neuropathic pain - gabapentin (NEURONTIN) 100 MG capsule; Take 1 capsule (100 mg total) by mouth See admin instructions. Take 200mg  in the morning, 200mg  every evening and 300mg  at bedtime.  Dispense: 210 capsule; Refill: 0  7. Psoriasis - fluocinolone (SYNALAR) 0.025 % cream; Apply topically 2 (two) times daily. To the scalp and sparingly to the face  Dispense: 120 g; Refill: prn  8. Hypomagnesemia - Magnesium; Future  9. Loose stools - loperamide (IMODIUM) 2 MG capsule; Take 1 capsule (2 mg total) by mouth as needed for diarrhea or loose  stools. Do not take more than 6 in a day  Dispense: 30 capsule; Refill: 0    Patient have been counseled extensively about nutrition and exercise  Return in about 3 months (around 01/13/2020) for PCP; chronic conditions.  The patient was given clear instructions to go to ER or return to medical center if symptoms don't improve, worsen or new problems develop. The patient verbalized understanding. The patient was told to call to get lab results if they haven't heard anything in the next week.     Freeman Caldron, PA-C [facility] [place], [place]   10/13/2019, 2:14 PM

## 2019-10-13 ENCOUNTER — Ambulatory Visit: Payer: Medicare Other | Attending: Family Medicine | Admitting: Physician Assistant

## 2019-10-13 ENCOUNTER — Other Ambulatory Visit: Payer: Self-pay

## 2019-10-13 VITALS — BP 107/67 | HR 96 | Temp 97.7°F

## 2019-10-13 DIAGNOSIS — Z89612 Acquired absence of left leg above knee: Secondary | ICD-10-CM | POA: Insufficient documentation

## 2019-10-13 DIAGNOSIS — I5042 Chronic combined systolic (congestive) and diastolic (congestive) heart failure: Secondary | ICD-10-CM | POA: Insufficient documentation

## 2019-10-13 DIAGNOSIS — I4821 Permanent atrial fibrillation: Secondary | ICD-10-CM | POA: Diagnosis not present

## 2019-10-13 DIAGNOSIS — Z09 Encounter for follow-up examination after completed treatment for conditions other than malignant neoplasm: Secondary | ICD-10-CM | POA: Diagnosis not present

## 2019-10-13 DIAGNOSIS — Z86718 Personal history of other venous thrombosis and embolism: Secondary | ICD-10-CM | POA: Insufficient documentation

## 2019-10-13 DIAGNOSIS — R197 Diarrhea, unspecified: Secondary | ICD-10-CM | POA: Diagnosis not present

## 2019-10-13 DIAGNOSIS — L409 Psoriasis, unspecified: Secondary | ICD-10-CM | POA: Insufficient documentation

## 2019-10-13 DIAGNOSIS — E1122 Type 2 diabetes mellitus with diabetic chronic kidney disease: Secondary | ICD-10-CM | POA: Insufficient documentation

## 2019-10-13 DIAGNOSIS — E1159 Type 2 diabetes mellitus with other circulatory complications: Secondary | ICD-10-CM | POA: Diagnosis not present

## 2019-10-13 DIAGNOSIS — N183 Chronic kidney disease, stage 3 unspecified: Secondary | ICD-10-CM | POA: Diagnosis not present

## 2019-10-13 DIAGNOSIS — R911 Solitary pulmonary nodule: Secondary | ICD-10-CM | POA: Diagnosis not present

## 2019-10-13 DIAGNOSIS — R195 Other fecal abnormalities: Secondary | ICD-10-CM

## 2019-10-13 DIAGNOSIS — M792 Neuralgia and neuritis, unspecified: Secondary | ICD-10-CM

## 2019-10-13 DIAGNOSIS — Z7901 Long term (current) use of anticoagulants: Secondary | ICD-10-CM | POA: Insufficient documentation

## 2019-10-13 DIAGNOSIS — Z89611 Acquired absence of right leg above knee: Secondary | ICD-10-CM | POA: Diagnosis not present

## 2019-10-13 DIAGNOSIS — I5032 Chronic diastolic (congestive) heart failure: Secondary | ICD-10-CM | POA: Diagnosis not present

## 2019-10-13 DIAGNOSIS — E1140 Type 2 diabetes mellitus with diabetic neuropathy, unspecified: Secondary | ICD-10-CM | POA: Insufficient documentation

## 2019-10-13 DIAGNOSIS — D72819 Decreased white blood cell count, unspecified: Secondary | ICD-10-CM | POA: Diagnosis not present

## 2019-10-13 DIAGNOSIS — IMO0001 Reserved for inherently not codable concepts without codable children: Secondary | ICD-10-CM

## 2019-10-13 DIAGNOSIS — I130 Hypertensive heart and chronic kidney disease with heart failure and stage 1 through stage 4 chronic kidney disease, or unspecified chronic kidney disease: Secondary | ICD-10-CM | POA: Diagnosis not present

## 2019-10-13 MED ORDER — LOPERAMIDE HCL 2 MG PO CAPS: 2.0000 mg | ORAL_CAPSULE | ORAL | 0 refills | Status: DC | PRN

## 2019-10-13 MED ORDER — FLUOCINOLONE ACETONIDE 0.025 % EX CREA: TOPICAL_CREAM | Freq: Two times a day (BID) | CUTANEOUS | 99 refills | Status: AC

## 2019-10-13 MED ORDER — GABAPENTIN 100 MG PO CAPS: 100.0000 mg | ORAL_CAPSULE | ORAL | 0 refills | Status: AC

## 2019-11-04 ENCOUNTER — Ambulatory Visit: Payer: Medicare Other | Admitting: Family Medicine

## 2019-11-14 ENCOUNTER — Ambulatory Visit (HOSPITAL_COMMUNITY): Admission: RE | Admit: 2019-11-14 | Payer: Medicare Other | Source: Ambulatory Visit

## 2019-11-22 ENCOUNTER — Other Ambulatory Visit: Payer: Self-pay | Admitting: Family Medicine

## 2019-11-22 ENCOUNTER — Other Ambulatory Visit: Payer: Self-pay | Admitting: Physician Assistant

## 2019-11-22 DIAGNOSIS — I4891 Unspecified atrial fibrillation: Secondary | ICD-10-CM

## 2019-11-22 DIAGNOSIS — R195 Other fecal abnormalities: Secondary | ICD-10-CM

## 2019-11-22 NOTE — Telephone Encounter (Signed)
Requested Prescriptions  Pending Prescriptions Disp Refills   ELIQUIS 5 MG TABS tablet [Pharmacy Med Name: ELIQUIS 5MG  TABLETS] 90 tablet 0    Sig: TAKE 1 TABLET BY MOUTH TWICE DAILY     Hematology:  Anticoagulants Failed - 11/22/2019  6:27 PM      Failed - HGB in normal range and within 360 days    Hemoglobin  Date Value Ref Range Status  09/12/2019 12.9 (L) 13.0 - 17.0 g/dL Final  08/26/2019 14.0 13.0 - 17.7 g/dL Final         Failed - Cr in normal range and within 360 days    Creatinine, Ser  Date Value Ref Range Status  09/12/2019 1.65 (H) 0.61 - 1.24 mg/dL Final         Passed - PLT in normal range and within 360 days    Platelets  Date Value Ref Range Status  09/12/2019 234 150 - 400 K/uL Final  08/26/2019 199 150 - 450 x10E3/uL Final         Passed - HCT in normal range and within 360 days    HCT  Date Value Ref Range Status  09/12/2019 39.5 39 - 52 % Final   Hematocrit  Date Value Ref Range Status  08/26/2019 41.4 37.5 - 51.0 % Final         Passed - Valid encounter within last 12 months    Recent Outpatient Visits          1 month ago Chronic diastolic CHF (congestive heart failure) [facility])   [place], [place], [place]   2 months ago Chronic diastolic CHF (congestive heart failure) ([place])   [place] Fulp, [place], [place]   5 months ago Type 2 diabetes mellitus with other circulatory complication, without long-term current use of insulin ([place])   [place] Fulp, [place], MD   10 months ago Chronic diastolic CHF (congestive heart failure) ([place])   [place] Fulp, [place], MD   1 year ago Type 2 diabetes mellitus with other circulatory complication, without long-term current use of insulin ([place])   [place] Fulp, [place], MD      Future Appointments            In 1 month Fulp, Ander Gaster, MD [place]

## 2019-12-13 ENCOUNTER — Other Ambulatory Visit: Payer: Self-pay

## 2019-12-13 ENCOUNTER — Encounter (HOSPITAL_COMMUNITY): Payer: Self-pay

## 2019-12-13 ENCOUNTER — Emergency Department (HOSPITAL_COMMUNITY): Payer: Medicare Other

## 2019-12-13 ENCOUNTER — Inpatient Hospital Stay (HOSPITAL_COMMUNITY)
Admission: EM | Admit: 2019-12-13 | Discharge: 2019-12-28 | DRG: 291 | Disposition: E | Payer: Medicare Other | Attending: Internal Medicine | Admitting: Internal Medicine

## 2019-12-13 DIAGNOSIS — I480 Paroxysmal atrial fibrillation: Secondary | ICD-10-CM | POA: Diagnosis present

## 2019-12-13 DIAGNOSIS — Z66 Do not resuscitate: Secondary | ICD-10-CM | POA: Diagnosis present

## 2019-12-13 DIAGNOSIS — N5089 Other specified disorders of the male genital organs: Secondary | ICD-10-CM | POA: Diagnosis present

## 2019-12-13 DIAGNOSIS — E1151 Type 2 diabetes mellitus with diabetic peripheral angiopathy without gangrene: Secondary | ICD-10-CM | POA: Diagnosis present

## 2019-12-13 DIAGNOSIS — Z7189 Other specified counseling: Secondary | ICD-10-CM | POA: Diagnosis not present

## 2019-12-13 DIAGNOSIS — R627 Adult failure to thrive: Secondary | ICD-10-CM | POA: Diagnosis present

## 2019-12-13 DIAGNOSIS — I5023 Acute on chronic systolic (congestive) heart failure: Secondary | ICD-10-CM | POA: Diagnosis present

## 2019-12-13 DIAGNOSIS — E11649 Type 2 diabetes mellitus with hypoglycemia without coma: Secondary | ICD-10-CM | POA: Diagnosis not present

## 2019-12-13 DIAGNOSIS — N189 Chronic kidney disease, unspecified: Secondary | ICD-10-CM

## 2019-12-13 DIAGNOSIS — U071 COVID-19: Secondary | ICD-10-CM | POA: Diagnosis present

## 2019-12-13 DIAGNOSIS — Z823 Family history of stroke: Secondary | ICD-10-CM

## 2019-12-13 DIAGNOSIS — L89892 Pressure ulcer of other site, stage 2: Secondary | ICD-10-CM | POA: Diagnosis present

## 2019-12-13 DIAGNOSIS — R630 Anorexia: Secondary | ICD-10-CM | POA: Diagnosis not present

## 2019-12-13 DIAGNOSIS — Z7901 Long term (current) use of anticoagulants: Secondary | ICD-10-CM

## 2019-12-13 DIAGNOSIS — I2510 Atherosclerotic heart disease of native coronary artery without angina pectoris: Secondary | ICD-10-CM | POA: Diagnosis present

## 2019-12-13 DIAGNOSIS — I130 Hypertensive heart and chronic kidney disease with heart failure and stage 1 through stage 4 chronic kidney disease, or unspecified chronic kidney disease: Principal | ICD-10-CM | POA: Diagnosis present

## 2019-12-13 DIAGNOSIS — R0602 Shortness of breath: Secondary | ICD-10-CM | POA: Diagnosis not present

## 2019-12-13 DIAGNOSIS — L89151 Pressure ulcer of sacral region, stage 1: Secondary | ICD-10-CM | POA: Diagnosis present

## 2019-12-13 DIAGNOSIS — N179 Acute kidney failure, unspecified: Secondary | ICD-10-CM | POA: Diagnosis present

## 2019-12-13 DIAGNOSIS — Z515 Encounter for palliative care: Secondary | ICD-10-CM | POA: Diagnosis not present

## 2019-12-13 DIAGNOSIS — Z993 Dependence on wheelchair: Secondary | ICD-10-CM

## 2019-12-13 DIAGNOSIS — M109 Gout, unspecified: Secondary | ICD-10-CM | POA: Diagnosis present

## 2019-12-13 DIAGNOSIS — Z87891 Personal history of nicotine dependence: Secondary | ICD-10-CM

## 2019-12-13 DIAGNOSIS — Z89612 Acquired absence of left leg above knee: Secondary | ICD-10-CM | POA: Diagnosis not present

## 2019-12-13 DIAGNOSIS — Z8249 Family history of ischemic heart disease and other diseases of the circulatory system: Secondary | ICD-10-CM

## 2019-12-13 DIAGNOSIS — Z89611 Acquired absence of right leg above knee: Secondary | ICD-10-CM | POA: Diagnosis not present

## 2019-12-13 DIAGNOSIS — L409 Psoriasis, unspecified: Secondary | ICD-10-CM | POA: Diagnosis present

## 2019-12-13 DIAGNOSIS — E1122 Type 2 diabetes mellitus with diabetic chronic kidney disease: Secondary | ICD-10-CM | POA: Diagnosis present

## 2019-12-13 DIAGNOSIS — Z79899 Other long term (current) drug therapy: Secondary | ICD-10-CM

## 2019-12-13 DIAGNOSIS — R54 Age-related physical debility: Secondary | ICD-10-CM | POA: Diagnosis present

## 2019-12-13 DIAGNOSIS — N184 Chronic kidney disease, stage 4 (severe): Secondary | ICD-10-CM | POA: Diagnosis present

## 2019-12-13 DIAGNOSIS — I482 Chronic atrial fibrillation, unspecified: Secondary | ICD-10-CM

## 2019-12-13 DIAGNOSIS — I10 Essential (primary) hypertension: Secondary | ICD-10-CM | POA: Diagnosis present

## 2019-12-13 HISTORY — DX: Atherosclerotic heart disease of native coronary artery without angina pectoris: I25.10

## 2019-12-13 HISTORY — DX: Cardiac arrhythmia, unspecified: I49.9

## 2019-12-13 HISTORY — DX: Heart failure, unspecified: I50.9

## 2019-12-13 LAB — CBC WITH DIFFERENTIAL/PLATELET
Abs Immature Granulocytes: 0.09 10*3/uL — ABNORMAL HIGH (ref 0.00–0.07)
Basophils Absolute: 0 10*3/uL (ref 0.0–0.1)
Basophils Relative: 0 %
Eosinophils Absolute: 0 10*3/uL (ref 0.0–0.5)
Eosinophils Relative: 0 %
HCT: 49.7 % (ref 39.0–52.0)
Hemoglobin: 15.9 g/dL (ref 13.0–17.0)
Immature Granulocytes: 2 %
Lymphocytes Relative: 3 %
Lymphs Abs: 0.2 10*3/uL — ABNORMAL LOW (ref 0.7–4.0)
MCH: 33.9 pg (ref 26.0–34.0)
MCHC: 32 g/dL (ref 30.0–36.0)
MCV: 106 fL — ABNORMAL HIGH (ref 80.0–100.0)
Monocytes Absolute: 0.4 10*3/uL (ref 0.1–1.0)
Monocytes Relative: 8 %
Neutro Abs: 4.5 10*3/uL (ref 1.7–7.7)
Neutrophils Relative %: 87 %
Platelets: UNDETERMINED 10*3/uL (ref 150–400)
RBC: 4.69 MIL/uL (ref 4.22–5.81)
RDW: 16.9 % — ABNORMAL HIGH (ref 11.5–15.5)
WBC: 5.2 10*3/uL (ref 4.0–10.5)
nRBC: 0 % (ref 0.0–0.2)

## 2019-12-13 LAB — COMPREHENSIVE METABOLIC PANEL
ALT: 21 U/L (ref 0–44)
AST: 33 U/L (ref 15–41)
Albumin: 2.3 g/dL — ABNORMAL LOW (ref 3.5–5.0)
Alkaline Phosphatase: 106 U/L (ref 38–126)
Anion gap: 12 (ref 5–15)
BUN: 43 mg/dL — ABNORMAL HIGH (ref 8–23)
CO2: 20 mmol/L — ABNORMAL LOW (ref 22–32)
Calcium: 8.6 mg/dL — ABNORMAL LOW (ref 8.9–10.3)
Chloride: 103 mmol/L (ref 98–111)
Creatinine, Ser: 2.35 mg/dL — ABNORMAL HIGH (ref 0.61–1.24)
GFR calc Af Amer: 27 mL/min — ABNORMAL LOW (ref >60)
GFR calc non Af Amer: 23 mL/min — ABNORMAL LOW (ref >60)
Glucose, Bld: 134 mg/dL — ABNORMAL HIGH (ref 70–99)
Potassium: 5 mmol/L (ref 3.5–5.1)
Sodium: 135 mmol/L (ref 135–145)
Total Bilirubin: 3.5 mg/dL — ABNORMAL HIGH (ref 0.3–1.2)
Total Protein: 5.9 g/dL — ABNORMAL LOW (ref 6.5–8.1)

## 2019-12-13 LAB — TROPONIN I (HIGH SENSITIVITY)
Troponin I (High Sensitivity): 94 ng/L — ABNORMAL HIGH (ref <18)
Troponin I (High Sensitivity): 124 ng/L (ref <18)

## 2019-12-13 LAB — MAGNESIUM: Magnesium: 2.1 mg/dL (ref 1.7–2.4)

## 2019-12-13 LAB — LIPASE, BLOOD: Lipase: 17 U/L (ref 11–51)

## 2019-12-13 LAB — SARS CORONAVIRUS 2 BY RT PCR (HOSPITAL ORDER, PERFORMED IN ~~LOC~~ HOSPITAL LAB): SARS Coronavirus 2: POSITIVE — AB

## 2019-12-13 LAB — BRAIN NATRIURETIC PEPTIDE: B Natriuretic Peptide: 3410.3 pg/mL — ABNORMAL HIGH (ref 0.0–100.0)

## 2019-12-13 MED ORDER — FUROSEMIDE 10 MG/ML IJ SOLN: 20.0000 mg | Freq: Once | INTRAMUSCULAR | Status: DC

## 2019-12-13 MED ORDER — FUROSEMIDE 10 MG/ML IJ SOLN
40.0000 mg | Freq: Once | INTRAMUSCULAR | Status: AC
  Administered 2019-12-13: 40 mg via INTRAVENOUS
  Filled 2019-12-13: qty 4

## 2019-12-13 NOTE — ED Triage Notes (Signed)
Patient complains of bilateral lower extremity swelling and diarrhea x 1 week. Complains of intermittent cramping. Patient states that he takes fluid pills as prescribed, complains of joint pain

## 2019-12-13 NOTE — ED Provider Notes (Signed)
[facility] EMERGENCY DEPARTMENT Provider Note   CSN: [id number] Arrival date & time: 12/11/2019  1115     History No chief complaint on file.   [name] [name] is a 86 y.o. male hx of DVT, DM, HTN, here with leg swelling, shortness of breath, diarrhea, fall.  Is from home and has a previous bilateral AKA.  He is wheelchair-bound.  Apparently 2 days ago, his right stump got caught when he was transferring.  He had progressive pain in bilateral stumps for the last 2 days.  Also daughter noticed increasing swelling of the scrotum and the stumps.  Patient has some loose stool as well.  Also has some subjective shortness of breath.  Patient is on Lasix at baseline.  Patient was admitted for heart failure back in May.  Denies any chest pain.  The history is provided by the patient.       Past Medical History:  Diagnosis Date  . Chronic kidney disease    RENAL INSUFICCIENCY  . Diabetes mellitus without complication ([place])   . DVT (deep venous thrombosis) ([place])   . Gout   . Hypertension   . Lymphadenitis 08/10/2012  . Psoriasis     Patient Active Problem List   Diagnosis Date Noted  . Acute on chronic diastolic (congestive) heart failure ([place]) 09/08/2019  . Acute on chronic systolic CHF (congestive heart failure), NYHA class 1 ([place]) 09/08/2019  . Acute on chronic combined systolic and diastolic CHF (congestive heart failure) ([place]) 08/09/2019  . PAF (paroxysmal atrial fibrillation) ([place])   . Scrotal swelling   . Hypotension   . CHF exacerbation ([place]) 03/18/2019  . Acute on chronic systolic CHF (congestive heart failure) ([place]) 11/23/2018  . Troponin level elevated 11/23/2018  . Acute on chronic heart failure ([place])   . Atrial fibrillation and flutter ([place])   . Atrial fibrillation with RVR ([place]) 07/15/2018  . S/P AKA (above knee amputation) bilateral ([place])   . Pressure injury of skin 10/04/2017  . Hyperkalemia 11/17/2016  . Venous stasis ulcer ([place]) 11/17/2016  .  Venous stasis ulcer of lower extremity ([place]) 05/23/2015  . Cellulitis of both lower extremities 10/18/2014  . Chronic renal insufficiency, stage 3 (moderate) ([place]) 10/18/2014  . HTN (hypertension), benign 10/18/2014  . Chronic diastolic CHF (congestive heart failure) ([place]) 10/18/2014  . Lower extremity cellulitis 10/18/2014  . PAD (peripheral artery disease) ([place]) 02/02/2014  . Atherosclerosis of native arteries of the extremities with ulceration ([place]) 07/22/2013  . Acute renal failure ([place]) 11/15/2012  . Fall 11/15/2012  . Diabetic ulcer of left foot ([place]) 10/15/2012  . Diabetes mellitus ([place]) 10/15/2012  . Lymphadenitis 08/07/2012  . Cellulitis 08/07/2012  . HTN (hypertension) 08/05/2012  . Renal insufficiency 08/05/2012  . Tobacco abuse 08/05/2012  . Gout 08/05/2012  . Hyperglycemia 08/05/2012    Past Surgical History:  Procedure Laterality Date  . AMPUTATION Bilateral 10/08/2017   Procedure: Bilateral  ABOVE KNEE amputations;  Surgeon: Conrad [place], MD;  Location: [facility] OR;  Service: Vascular;  Laterality: Bilateral;       Family History  Problem Relation Age of Onset  . Hypertension Mother   . Stroke Mother   . Hypertension Father     Social History   Tobacco Use  . Smoking status: Former Smoker    Years: 40.00    Types: Pipe  . Smokeless tobacco: Never Used  Vaping Use  . Vaping Use: Never used  Substance Use Topics  . Alcohol use: No  .  Drug use: No    Home Medications Prior to Admission medications   Medication Sig Start Date End Date Taking? Authorizing Provider  ELIQUIS 5 MG TABS tablet TAKE 1 TABLET BY MOUTH TWICE DAILY 11/22/19   Fulp, Cammie, MD  famotidine (PEPCID) 20 MG tablet Take 1 tablet (20 mg total) by mouth 2 (two) times daily. To reduce stomach acid 08/26/19   Fulp, Cammie, MD  fluocinolone (SYNALAR) 0.025 % cream Apply topically 2 (two) times daily. To the scalp and sparingly to the face 10/13/19   Argentina Donovan, PA-C  furosemide (LASIX) 40  MG tablet Take 1 tablet (40 mg total) by mouth 2 (two) times daily. 09/11/19   Mercy Riding, MD  gabapentin (NEURONTIN) 100 MG capsule Take 1 capsule (100 mg total) by mouth See admin instructions. Take 200mg  in the morning, 200mg  every evening and 300mg  at bedtime. 10/13/19 11/12/19  Argentina Donovan, PA-C  hydrocortisone cream 1 % Apply topically 2 (two) times daily. 11/25/18   Shelly Coss, MD  loperamide (IMODIUM) 2 MG capsule TAKE 1 CAPSULE BY MOUTH AS NEEDED FOR DIARRHEA OR LOOSE STOOLS. DO NOT TAKE MORE THAN 6 IN A DAY 11/22/19   Freeman Caldron M, PA-C  midodrine (PROAMATINE) 2.5 MG tablet Take 1 tablet (2.5 mg total) by mouth 3 (three) times daily with meals. 09/11/19   Mercy Riding, MD  Potassium Chloride ER 20 MEQ TBCR Take 20 mEq by mouth daily. 08/12/19 09/11/19  Barb Merino, MD  silver sulfADIAZINE (SILVADENE) 1 % cream Apply 1 application topically daily. Once daily to area of skin irritation on the right stump x 10 days then as needed Patient not taking: Reported on 09/08/2019 05/27/19   Fulp, Ander Gaster, MD  triamcinolone ointment (KENALOG) 0.1 % Apply 1 application topically 2 (two) times daily as needed (skin irritation.).  07/27/19   [provider]    Allergies    Patient has no known allergies.  Review of Systems   Review of Systems  Cardiovascular: Positive for leg swelling.  Genitourinary: Positive for scrotal swelling.  All other systems reviewed and are negative.   Physical Exam Updated Vital Signs BP 99/83   Pulse 87   Temp 98.2 F (36.8 C) (Oral)   Resp 15   Wt 108.9 kg   SpO2 98%   BMI 73.24 kg/m   Physical Exam Vitals and nursing note reviewed.  Constitutional:      Comments: Chronically ill   HENT:     Head: Normocephalic.     Nose: Nose normal.     Mouth/Throat:     Mouth: Mucous membranes are moist.  Eyes:     Extraocular Movements: Extraocular movements intact.     Pupils: Pupils are equal, round, and reactive to light.    Cardiovascular:     Rate and Rhythm: Normal rate. Rhythm irregular.     Pulses: Normal pulses.     Heart sounds: Normal heart sounds.  Pulmonary:     Comments: Crackles bilateral bases  Abdominal:     General: Abdomen is flat.     Palpations: Abdomen is soft.  Genitourinary:    Comments: 2+ scrotal edema, no obvious forniere's gangrene  Musculoskeletal:     Cervical back: Normal range of motion.     Comments: Bilateral AKA with 2+ edema.  He has stage II decub in the distal aspect of the leftAKA stump.  He also has stage I sacral decub ulcer.  Skin:    General: Skin is  warm.     Capillary Refill: Capillary refill takes less than 2 seconds.  Neurological:     General: No focal deficit present.     Mental Status: He is alert.  Psychiatric:        Mood and Affect: Mood normal.        Behavior: Behavior normal.     ED Results / Procedures / Treatments   Labs (all labs ordered are listed, but only abnormal results are displayed) Labs Reviewed  COMPREHENSIVE METABOLIC PANEL - Abnormal; Notable for the following components:      Result Value   CO2 20 (*)    Glucose, Bld 134 (*)    BUN 43 (*)    Creatinine, Ser 2.35 (*)    Calcium 8.6 (*)    Total Protein 5.9 (*)    Albumin 2.3 (*)    Total Bilirubin 3.5 (*)    GFR calc non Af Amer 23 (*)    GFR calc Af Amer 27 (*)    All other components within normal limits  CBC WITH DIFFERENTIAL/PLATELET - Abnormal; Notable for the following components:   MCV 106.0 (*)    RDW 16.9 (*)    Lymphs Abs 0.2 (*)    Abs Immature Granulocytes 0.09 (*)    All other components within normal limits  BRAIN NATRIURETIC PEPTIDE - Abnormal; Notable for the following components:   B Natriuretic Peptide 3,410.3 (*)    All other components within normal limits  TROPONIN I (HIGH SENSITIVITY) - Abnormal; Notable for the following components:   Troponin I (High Sensitivity) 94 (*)    All other components within normal limits  SARS CORONAVIRUS 2 BY RT  PCR (HOSPITAL ORDER, [facility])  LIPASE, BLOOD  MAGNESIUM  TROPONIN I (HIGH SENSITIVITY)    EKG EKG Interpretation  Date/Time:  Tuesday December 13 2019 18:13:40 EDT Ventricular Rate:  110 PR Interval:    QRS Duration: 102 QT Interval:  338 QTC Calculation: 458 R Axis:   68 Text Interpretation: Atrial fibrillation Anterior infarct, old Nonspecific T abnormalities, lateral leads No significant change since last tracing Confirmed by Wandra Arthurs [phone]) on 11/30/2019 6:15:46 PM   Radiology DG Chest Port 1 View  Result Date: 12/22/2019 CLINICAL DATA:  Short of breath, bilateral lower extremity edema EXAM: PORTABLE CHEST 1 VIEW COMPARISON:  09/08/2019 FINDINGS: Single frontal view of the chest demonstrates an enlarged cardiac silhouette. Thoracic aorta is ectatic. Bibasilar veiling opacities are seen consistent with effusions and consolidation. Progressive central vascular congestion. No pneumothorax. IMPRESSION: 1. Findings consistent with congestive heart failure. Electronically Signed   By: Randa Ngo M.D.   On: 12/03/2019 18:50    Procedures Procedures (including critical care time)  Medications Ordered in ED Medications  furosemide (LASIX) injection 40 mg (has no administration in time range)    ED Course  I have reviewed the triage vital signs and the nursing notes.  Pertinent labs & imaging results that were available during my care of the patient were reviewed by me and considered in my medical decision making (see chart for details).    MDM Rules/Calculators/A&P                          [name] [name] is a 86 y.o. male history of heart failure here presenting with shortness of breath and leg swelling.  I think likely CHF exacerbation.  Plan to get CBC and CMP and BNP and  troponin.  We will also get chest x-ray and likely need diuresis.  8:16 PM BNP elevated at 3400, Cr is 2.35. CXR showed pulmonary edema. Given lasix. Will admit for CHF  exacerbation.   Final Clinical Impression(s) / ED Diagnoses Final diagnoses:  None    Rx / DC Orders ED Discharge Orders    None       Drenda Freeze, MD 12/09/2019 2017

## 2019-12-13 NOTE — ED Notes (Signed)
Daughter, [name], asking to be contacted when pt is roomed.

## 2019-12-13 NOTE — H&P (Signed)
History and Physical    [name] [name] GHW:[id number] DOB: [birth year] DOA: 11/30/2019  PCP: [name], MD   Patient coming from: Home  Chief Complaint: Swelling of legs and scrotum.  Shortness of breath.  HPI: [name] [name] is a 86 y.o. male with medical history significant for hx of DVT, a-fib, CKD, HTN, bilateral AKA.  Presents from home with swelling of lower extremities and scrotum.  He also reports having shortness of breath that is progressively worsened over the last few days.  He reports he has not had any chest pain or pressure but does have intermittent palpitations which is not uncommon for him.  States he has not had any fever or cough.  Reports he has been taking his medications at home as prescribed.  He reports that the swelling of his legs and scrotum began 2 to 3 days ago and is progressively gotten worse.  He states he has been able to urinate but feels like it is harder to start a stream than previously.   ED Course: In the emergency room he is found to have significant edema of his bilateral lower extremity stumps as well as scrotum and penis.  He was given dose of 40 mg of Lasix IV in the emergency room.  I added another 20 mg of Lasix.  He does take 40 mg at home orally. BNP is elevated.  Troponin is mildly elevated but less than previous levels.  He has not had any chest pain.  He has mild increase in BUN and creatinine from baseline.  Hospital service will admit for further management  Review of Systems:  General: Denies weakness, fever, chills, weight loss, night sweats.  Denies dizziness.  Denies change in appetite HENT: Denies head trauma, headache, denies change in hearing, tinnitus.  Denies nasal congestion or bleeding.  Denies sore throat, sores in mouth.  Denies difficulty swallowing Eyes: Denies blurry vision, pain in eye, drainage.  Denies discoloration of eyes. Neck: Denies pain.  Denies swelling.  Denies pain with movement. Cardiovascular: Denies chest pain,  reports chronic palpitations.  Reports edema of the scrotum and extremities.  Reports orthopnea Respiratory: Reports shortness of breath.  Denies cough.  Denies wheezing.  Denies sputum production Gastrointestinal: Denies abdominal pain, swelling.  Denies nausea, vomiting, diarrhea.  Denies melena.  Denies hematemesis. Musculoskeletal: Denies limitation of movement.  Denies deformity or swelling.  Denies pain.  Denies arthralgias or myalgias. Genitourinary: Denies pelvic pain.  Denies urinary frequency or hesitancy.  Denies dysuria.  Skin: Denies rash.  Denies petechiae, purpura, ecchymosis. Neurological: Denies headache.  Denies syncope.  Denies seizure activity.  Denies weakness or paresthesia.  Denies slurred speech, drooping face.  Denies visual change. Psychiatric: Denies depression, anxiety.  Denies suicidal thoughts or ideation.  Denies hallucinations.  Past Medical History:  Diagnosis Date  . CHF (congestive heart failure) ([place])   . Chronic kidney disease    RENAL INSUFICCIENCY  . Coronary artery disease   . Diabetes mellitus without complication ([place])   . DVT (deep venous thrombosis) ([place])   . Dysrhythmia   . Gout   . Hypertension   . Lymphadenitis 08/10/2012  . Psoriasis     Past Surgical History:  Procedure Laterality Date  . AMPUTATION Bilateral 10/08/2017   Procedure: Bilateral  ABOVE KNEE amputations;  Surgeon: Conrad Ames, MD;  Location: [facility];  Service: Vascular;  Laterality: Bilateral;    Social History  reports that he has quit smoking. His smoking use included pipe. He quit after  40.00 years of use. He has never used smokeless tobacco. He reports that he does not drink alcohol and does not use drugs.  No Known Allergies  Family History  Problem Relation Age of Onset  . Hypertension Mother   . Stroke Mother   . Hypertension Father      Prior to Admission medications   Medication Sig Start Date End Date Taking? Authorizing Provider  ELIQUIS 5 MG TABS  tablet TAKE 1 TABLET BY MOUTH TWICE DAILY 11/22/19   Fulp, Cammie, MD  famotidine (PEPCID) 20 MG tablet Take 1 tablet (20 mg total) by mouth 2 (two) times daily. To reduce stomach acid 08/26/19   Fulp, Cammie, MD  fluocinolone (SYNALAR) 0.025 % cream Apply topically 2 (two) times daily. To the scalp and sparingly to the face 10/13/19   Argentina Donovan, PA-C  furosemide (LASIX) 40 MG tablet Take 1 tablet (40 mg total) by mouth 2 (two) times daily. 09/11/19   Mercy Riding, MD  gabapentin (NEURONTIN) 100 MG capsule Take 1 capsule (100 mg total) by mouth See admin instructions. Take 200mg  in the morning, 200mg  every evening and 300mg  at bedtime. 10/13/19 11/12/19  Argentina Donovan, PA-C  hydrocortisone cream 1 % Apply topically 2 (two) times daily. 11/25/18   Shelly Coss, MD  loperamide (IMODIUM) 2 MG capsule TAKE 1 CAPSULE BY MOUTH AS NEEDED FOR DIARRHEA OR LOOSE STOOLS. DO NOT TAKE MORE THAN 6 IN A DAY 11/22/19   Freeman Caldron M, PA-C  midodrine (PROAMATINE) 2.5 MG tablet Take 1 tablet (2.5 mg total) by mouth 3 (three) times daily with meals. 09/11/19   Mercy Riding, MD  Potassium Chloride ER 20 MEQ TBCR Take 20 mEq by mouth daily. 08/12/19 09/11/19  Barb Merino, MD  silver sulfADIAZINE (SILVADENE) 1 % cream Apply 1 application topically daily. Once daily to area of skin irritation on the right stump x 10 days then as needed Patient not taking: Reported on 09/08/2019 05/27/19   Fulp, Ander Gaster, MD  triamcinolone ointment (KENALOG) 0.1 % Apply 1 application topically 2 (two) times daily as needed (skin irritation.).  07/27/19   [provider]    Physical Exam: Vitals:   12/18/2019 1449 11/27/2019 1912 12/24/2019 1918 12/17/2019 2057  BP: 96/66  99/83 (!) 87/67  Pulse: (!) 111 87  95  Resp: 19 15  16   Temp: 98.2 F (36.8 C)     TempSrc: Oral     SpO2: 93% 98%  100%  Weight:        Constitutional: NAD, calm, comfortable Vitals:   12/26/2019 1449 11/27/2019 1912 12/14/2019 1918 12/10/2019 2057  BP:  96/66  99/83 (!) 87/67  Pulse: (!) 111 87  95  Resp: 19 15  16   Temp: 98.2 F (36.8 C)     TempSrc: Oral     SpO2: 93% 98%  100%  Weight:       General: WDWN, Alert and oriented x3.  Eyes: EOMI, PERRL, lids and conjunctivae normal.  Sclera nonicteric HENT:  [place]/AT, external ears normal.  Nares patent without epistasis.  Mucous membranes are moist. Posterior pharynx clear of any exudate or lesions. Normal dentition.  Neck: Soft, normal range of motion, supple, no masses, no thyromegaly.  Trachea midline Respiratory: clear to auscultation bilaterally, no wheezing, no crackles. Normal respiratory effort. No accessory muscle use.  Cardiovascular: Ocular rhythm.  No murmurs / rubs / gallops.  Has lower extremity edema. No carotid bruits.  Abdomen: Soft, no tenderness, nondistended, no  rebound or guarding.  No masses palpated.  Bowel sounds normoactive Musculoskeletal: Bilateral AKA's.  Bilateral lower extremity stumps edematous.  Has clubbing of digits.  cyanosis. No joint deformity. Normal muscle tone.   GU: Penis with significant edema.  No rash or drainage Skin: Warm, dry, intact no rashes, lesions, ulcers. No induration Neurologic: CN 2-12 grossly intact.  Normal speech.  Sensation intact, patella DTR +1 bilaterally. Strength 5/5 in all extremities.   Psychiatric: Normal judgment and insight.  Normal mood.    Labs on Admission: I have personally reviewed following labs and imaging studies  CBC: Recent Labs  Lab 12/18/2019 1752  WBC 5.2  NEUTROABS 4.5  HGB 15.9  HCT 49.7  MCV 106.0*  PLT PLATELET CLUMPS NOTED ON SMEAR, UNABLE TO ESTIMATE    Basic Metabolic Panel: Recent Labs  Lab 12/07/2019 1752  NA 135  K 5.0  CL 103  CO2 20*  GLUCOSE 134*  BUN 43*  CREATININE 2.35*  CALCIUM 8.6*  MG 2.1    GFR: Estimated Creatinine Clearance: 16.5 mL/min (A) (by C-G formula based on SCr of 2.35 mg/dL (H)).  Liver Function Tests: Recent Labs  Lab 12/01/2019 1752  AST 33  ALT 21    ALKPHOS 106  BILITOT 3.5*  PROT 5.9*  ALBUMIN 2.3*    Urine analysis:    Component Value Date/Time   COLORURINE YELLOW 03/18/2019 1834   APPEARANCEUR HAZY (A) 03/18/2019 1834   LABSPEC 1.006 03/18/2019 1834   PHURINE 6.0 03/18/2019 1834   GLUCOSEU NEGATIVE 03/18/2019 1834   [place] (A) 03/18/2019 1834   BILIRUBINUR NEGATIVE 03/18/2019 [place] 03/18/2019 1834   PROTEINUR NEGATIVE 03/18/2019 1834   UROBILINOGEN 0.2 11/16/2012 0345   NITRITE NEGATIVE 03/18/2019 1834   LEUKOCYTESUR LARGE (A) 03/18/2019 1834    Radiological Exams on Admission: DG Chest Port 1 View  Result Date: 12/18/2019 CLINICAL DATA:  Short of breath, bilateral lower extremity edema EXAM: PORTABLE CHEST 1 VIEW COMPARISON:  09/08/2019 FINDINGS: Single frontal view of the chest demonstrates an enlarged cardiac silhouette. Thoracic aorta is ectatic. Bibasilar veiling opacities are seen consistent with effusions and consolidation. Progressive central vascular congestion. No pneumothorax. IMPRESSION: 1. Findings consistent with congestive heart failure. Electronically Signed   By: Randa Ngo M.D.   On: 11/29/2019 18:50    EKG: Independently reviewed.  EKG shows atrial flutter with QTC of 428.  Nonspecific ST changes no acute ST elevation or depression.  Assessment/Plan Principal Problem:   Acute on chronic systolic CHF (congestive heart failure) [facility]) [name] [name] is admitted to cardiac telemetry floor. Diuresis with Lasix 60 mg IV twice daily for the next few days.  Monitor INO's.  Monitor daily weight. Check serial troponin levels overnight. Check echocardiogram in the morning to evaluate wall motion and EF. Patient will be started on low-dose ARB therapy with losartan and low-dose Coreg when blood pressure improves  Active Problems:   HTN (hypertension) Continue home clonidine and diltiazem dose.  Monitor blood pressure    Scrotal swelling Patient placed on Lasix twice daily.   Monitor    Acute kidney injury superimposed on CKD ([place]) Mild AKI superimposed on chronic kidney disease.  Patient placed on diuresis with acute CHF. Monitor electrolytes and renal function. This could be cardiorenal syndrome.     Atrial fibrillation, chronic ([place]) Continue home medications.  Continue Eliquis for anticoagulation.    S/P AKA (above knee amputation) bilateral (HCC)     DVT prophylaxis: Patient's anticoagulant Eliquis which will  be continued Code Status:   Full code Family Communication: Diagnosis and plan discussed with patient.  Patient verbalized understanding and agrees with plan.  Further recommendations to follow as clinically indicated   Disposition Plan:   Patient is from:  Home  Anticipated DC to:  Home  Anticipated DC date:  Anticipate greater than 2 midnight stay in the hospital  Anticipated DC barriers: No barriers to discharge identified   Admission status:  Inpatient  Severity of Illness: The appropriate patient status for this patient is INPATIENT. Inpatient status is judged to be reasonable and necessary in order to provide the required intensity of service to ensure the patient's safety. The patient's presenting symptoms, physical exam findings, and initial radiographic and laboratory data in the context of their chronic comorbidities is felt to place them at high risk for further clinical deterioration. Furthermore, it is not anticipated that the patient will be medically stable for discharge from the hospital within 2 midnights of admission. The following factors support the patient status of inpatient.     * I certify that at the point of admission it is my clinical judgment that the patient will require inpatient hospital care spanning beyond 2 midnights from the point of admission due to high intensity of service, high risk for further deterioration and high frequency of surveillance required.Yevonne Aline Ikey Omary MD [facility]  How to contact the [facility] Attending or Consulting provider [place] or covering provider during after hours [place], for this patient?   1. Check the care team in [facility] and look for a) attending/consulting [facility] provider listed and b) the [facility] team listed 2. Log into [email or web address] and use [facility]on [facility]'s universal password to access. If you do not have the password, please contact the hospital operator. 3. Locate the [facility] provider you are looking for under [facility] and page to a number that you can be directly reached. 4. If you still have difficulty reaching the provider, please page the [facility] (Director on Call) for the Hospitalists listed on amion for assistance.  12/11/2019, 9:41 PM

## 2019-12-13 NOTE — ED Provider Notes (Signed)
MSE was initiated and I personally evaluated the patient and placed orders (if any) at  12:10 PM on December 13, 2019.   Pt here with BLE swelling, worsening from baseline, "light" diarrhea, joint pain. Symptoms began last week. Taking diuretics as directed. Minimal SOB reported. Patient is s/p bilateral AKA. History of CHF, DM, HTN, a fib. Not vaccinated against COVID.  Pt is in no distress. Lungs clear. Labs ordered.  BP 98/74 (BP Location: Right Arm)   Pulse 97   Temp 98 F (36.7 C) (Oral)   Resp 20   Wt 108.9 kg   SpO2 98%   BMI 73.24 kg/m   The patient appears stable so that the remainder of the MSE may be completed by another provider.   Yaris Ferrell, [place] N, PA-C 12/05/2019 1214    Maudie Flakes, MD 11/28/2019 1534

## 2019-12-14 ENCOUNTER — Inpatient Hospital Stay (HOSPITAL_COMMUNITY): Payer: Medicare Other

## 2019-12-14 DIAGNOSIS — I482 Chronic atrial fibrillation, unspecified: Secondary | ICD-10-CM

## 2019-12-14 DIAGNOSIS — I5023 Acute on chronic systolic (congestive) heart failure: Secondary | ICD-10-CM

## 2019-12-14 LAB — ECHOCARDIOGRAM COMPLETE
Area-P 1/2: 4.31 cm2
S' Lateral: 3.3 cm
Weight: 3840 oz

## 2019-12-14 LAB — CBG MONITORING, ED: Glucose-Capillary: 85 mg/dL (ref 70–99)

## 2019-12-14 MED ORDER — SODIUM CHLORIDE 0.9% FLUSH: 3.0000 mL | INTRAVENOUS | Status: DC | PRN

## 2019-12-14 MED ORDER — ONDANSETRON HCL 4 MG/2ML IJ SOLN: 4.0000 mg | Freq: Four times a day (QID) | INTRAMUSCULAR | Status: DC | PRN

## 2019-12-14 MED ORDER — ACETAMINOPHEN 325 MG PO TABS
650.0000 mg | ORAL_TABLET | ORAL | Status: DC | PRN
  Administered 2019-12-14: 650 mg via ORAL
  Filled 2019-12-14: qty 2

## 2019-12-14 MED ORDER — SODIUM CHLORIDE 0.9% FLUSH
3.0000 mL | Freq: Two times a day (BID) | INTRAVENOUS | Status: DC
  Administered 2019-12-14 – 2019-12-15 (×2): 3 mL via INTRAVENOUS

## 2019-12-14 MED ORDER — POTASSIUM CHLORIDE CRYS ER 20 MEQ PO TBCR
20.0000 meq | EXTENDED_RELEASE_TABLET | Freq: Every day | ORAL | Status: DC
  Administered 2019-12-14 – 2019-12-15 (×2): 20 meq via ORAL
  Filled 2019-12-14 (×2): qty 1

## 2019-12-14 MED ORDER — FUROSEMIDE 10 MG/ML IJ SOLN
60.0000 mg | Freq: Two times a day (BID) | INTRAMUSCULAR | Status: DC
  Filled 2019-12-14: qty 6

## 2019-12-14 MED ORDER — OXYCODONE HCL 5 MG PO TABS
5.0000 mg | ORAL_TABLET | Freq: Four times a day (QID) | ORAL | Status: DC | PRN
  Administered 2019-12-14: 5 mg via ORAL
  Filled 2019-12-14: qty 1

## 2019-12-14 MED ORDER — APIXABAN 5 MG PO TABS
5.0000 mg | ORAL_TABLET | Freq: Two times a day (BID) | ORAL | Status: DC
  Administered 2019-12-14: 5 mg via ORAL
  Filled 2019-12-14: qty 1

## 2019-12-14 MED ORDER — FAMOTIDINE 20 MG PO TABS
20.0000 mg | ORAL_TABLET | Freq: Every day | ORAL | Status: DC
  Administered 2019-12-15: 20 mg via ORAL
  Filled 2019-12-14: qty 1

## 2019-12-14 MED ORDER — FAMOTIDINE 20 MG PO TABS
20.0000 mg | ORAL_TABLET | Freq: Two times a day (BID) | ORAL | Status: DC
  Administered 2019-12-14: 20 mg via ORAL
  Filled 2019-12-14: qty 1

## 2019-12-14 MED ORDER — FUROSEMIDE 10 MG/ML IJ SOLN
4.0000 mg/h | INTRAVENOUS | Status: DC
  Administered 2019-12-14: 4 mg/h via INTRAVENOUS
  Filled 2019-12-14: qty 25

## 2019-12-14 MED ORDER — SODIUM CHLORIDE 0.9 % IV SOLN: 250.0000 mL | INTRAVENOUS | Status: DC | PRN

## 2019-12-14 MED ORDER — MIDODRINE HCL 5 MG PO TABS
2.5000 mg | ORAL_TABLET | Freq: Three times a day (TID) | ORAL | Status: DC
  Administered 2019-12-14 – 2019-12-15 (×5): 2.5 mg via ORAL
  Filled 2019-12-14 (×5): qty 1

## 2019-12-14 MED ORDER — APIXABAN 2.5 MG PO TABS
2.5000 mg | ORAL_TABLET | Freq: Two times a day (BID) | ORAL | Status: DC
  Administered 2019-12-14 – 2019-12-15 (×2): 2.5 mg via ORAL
  Filled 2019-12-14 (×3): qty 1

## 2019-12-14 NOTE — Progress Notes (Signed)
  Echocardiogram 2D Echocardiogram has been performed.  [name] [name] 12/14/2019, 3:04 PM

## 2019-12-14 NOTE — ED Notes (Signed)
Breakfast Ordered 

## 2019-12-14 NOTE — Progress Notes (Signed)
PROGRESS NOTE    [name] [name]  SAY:[id number] DOB: Mar 23, 1927 DOA: 12/23/2019 PCP: [name], MD    Brief Narrative:  [name] [name] is a 86 y.o. male with medical history significant for hx of DVT, a-fib, CKD, HTN, bilateral AKA.  Presents from home with swelling of lower extremities and scrotum.  He also reports having shortness of breath that is progressively worsened over the last few days.  He reports he has not had any chest pain or pressure but does have intermittent palpitations which is not uncommon for him.  States he has not had any fever or cough.  Reports he has been taking his medications at home as prescribed.  He reports that the swelling of his legs and scrotum began 2 to 3 days ago and is progressively gotten worse.  He states he has been able to urinate but feels like it is harder to start a stream than previously.   At the emergency room, he was found with significant edema bilateral lower extremity stumps as well as scrotum and penis.  Was given bolus IV Lasix.  BNP elevated more than 3000.  Troponin mildly elevated but flat.  Without any chest pain.  Creatinine elevated from baseline.  Admitted with acute CHF exacerbation.  Last known ejection fraction 25%. Also found to have COVID-19 positivity.  Unvaccinated.   Assessment & Plan:   Principal Problem:   Acute on chronic systolic CHF (congestive heart failure) (HCC) Active Problems:   HTN (hypertension)   S/P AKA (above knee amputation) bilateral (HCC)   Scrotal swelling   Acute kidney injury superimposed on CKD (HCC)   Atrial fibrillation, chronic (HCC)  Acute on chronic systolic congestive heart failure: With acute renal failure. Started on IV diuresis with some clinical response.  We will continue IV diuresis.  Will change to Lasix infusion to help with diuresis.  Repeat echocardiogram today shows ejection fraction 25%. Not tolerating antihypertensives, beta-blockers or ACE inhibitors.  We will continue to  monitor. Previous ejection fraction 15%-45%-25%. With poor cardiovascular health, systolic heart failure and recurrent admission and debility patient does qualify for hospice level of care.  Discussed with patient's daughter at the bedside, will consult palliative medicine to help, educate and coordinate care.  Goal of care discussions and possible home with hospice.  Acute kidney injury superimposed on chronic kidney disease stage IV: Due to #1.  Will diurese and monitor closely.  Chronic atrial fibrillation: Rate controlled A. fib.  Status post above-knee amputation bilateral: Stable.  COVID-19 virus infection: Does not have much pulmonary symptoms.  He is most of the symptoms related to congestive heart failure and peripheral edema. We will continue to treat him with chest physiotherapy, bronchodilator therapy and incentive spirometry as much as he can tolerate. Given oxygen to keep saturations more than 90%. Currently no indication to treat with Covid directed therapies.  Will monitor closely. COVID-19 Labs  No results for input(s): DDIMER, FERRITIN, LDH, CRP in the last 72 hours.  Lab Results  Component Value Date   SARSCOV2NAA POSITIVE (A) 12/10/2019   SARSCOV2NAA NEGATIVE 09/08/2019   [place] NEGATIVE 08/09/2019   [place] NEGATIVE 03/18/2019   SpO2: 100 %    DVT prophylaxis: apixaban (ELIQUIS) tablet 2.5 mg Start: 12/14/19 2200 apixaban (ELIQUIS) tablet 2.5 mg   Code Status: DNR Family Communication: Daughter at the door Disposition Plan: Status is: Inpatient  Remains inpatient appropriate because:IV treatments appropriate due to intensity of illness or inability to take PO and Inpatient level of care appropriate  due to severity of illness   Dispo: The patient is from: Home              Anticipated d/c is to: Home              Anticipated d/c date is: 3 days              Patient currently is not medically stable to d/c.         Consultants:    Palliative medicine  Procedures:   None  Antimicrobials:   None   Subjective: Patient seen and examined.  He was on room air.  He denied any complaints other than lower back pain and scrotal pain.  He asked me to give him some pain medication.  He was not happy with attempted multiple IV lines.  Objective: Vitals:   12/14/19 1445 12/14/19 1516 12/14/19 1530 12/14/19 1611  BP: (!) 78/62 (!) 126/95 110/90 (!) 130/119  Pulse: (!) 49 (!) 102 93 99  Resp:      Temp:      TempSrc:      SpO2: 97% 98% 97% 100%  Weight:       No intake or output data in the 24 hours ending 12/14/19 1728 Filed Weights   12/23/2019 1210  Weight: 108.9 kg    Examination:  General exam: Chronically sick looking.  Not in any distress.  On room air. Respiratory system: No added sounds. Cardiovascular system: S1 & S2 heard, regular. Gastrointestinal system: Abdomen is nondistended, soft and nontender. No organomegaly or masses felt. Normal bowel sounds heard. Central nervous system: Mostly alert and oriented x2. Extremities: Bilateral above-knee amputation stump clean and dry. 2+ edema on the upper thighs.  3+ edema on penis and scrotum.    Data Reviewed: I have personally reviewed following labs and imaging studies  CBC: Recent Labs  Lab 12/10/2019 1752  WBC 5.2  NEUTROABS 4.5  HGB 15.9  HCT 49.7  MCV 106.0*  PLT PLATELET CLUMPS NOTED ON SMEAR, UNABLE TO ESTIMATE   Basic Metabolic Panel: Recent Labs  Lab 11/30/2019 1752  NA 135  K 5.0  CL 103  CO2 20*  GLUCOSE 134*  BUN 43*  CREATININE 2.35*  CALCIUM 8.6*  MG 2.1   GFR: Estimated Creatinine Clearance: 16.5 mL/min (A) (by C-G formula based on SCr of 2.35 mg/dL (H)). Liver Function Tests: Recent Labs  Lab 12/03/2019 1752  AST 33  ALT 21  ALKPHOS 106  BILITOT 3.5*  PROT 5.9*  ALBUMIN 2.3*   Recent Labs  Lab 12/21/2019 1752  LIPASE 17   No results for input(s): AMMONIA in the last 168 hours. Coagulation Profile: No  results for input(s): INR, PROTIME in the last 168 hours. Cardiac Enzymes: No results for input(s): CKTOTAL, CKMB, CKMBINDEX, TROPONINI in the last 168 hours. BNP (last 3 results) No results for input(s): PROBNP in the last 8760 hours. HbA1C: No results for input(s): HGBA1C in the last 72 hours. CBG: Recent Labs  Lab 12/14/19 0135  GLUCAP 85   Lipid Profile: No results for input(s): CHOL, HDL, LDLCALC, TRIG, CHOLHDL, LDLDIRECT in the last 72 hours. Thyroid Function Tests: No results for input(s): TSH, T4TOTAL, FREET4, T3FREE, THYROIDAB in the last 72 hours. Anemia Panel: No results for input(s): VITAMINB12, FOLATE, FERRITIN, TIBC, IRON, RETICCTPCT in the last 72 hours. Sepsis Labs: No results for input(s): PROCALCITON, LATICACIDVEN in the last 168 hours.  Recent Results (from the past 240 hour(s))  SARS Coronavirus 2 by RT  PCR (hospital order, performed in [facility] hospital lab) Nasopharyngeal Nasopharyngeal Swab     Status: Abnormal   Collection Time: 12/05/2019  6:19 PM   Specimen: Nasopharyngeal Swab  Result Value Ref Range Status   SARS Coronavirus 2 POSITIVE (A) NEGATIVE Final    Comment: RESULT CALLED TO, READ BACK BY AND VERIFIED WITH: G KOPP RN 2120 12/09/2019 A BROWNING (NOTE) SARS-CoV-2 target nucleic acids are DETECTED  SARS-CoV-2 RNA is generally detectable in upper respiratory specimens  during the acute phase of infection.  Positive results are indicative  of the presence of the identified virus, but do not rule out bacterial infection or co-infection with other pathogens not detected by the test.  Clinical correlation with patient history and  other diagnostic information is necessary to determine patient infection status.  The expected result is negative.  Fact Sheet for Patients:   StrictlyIdeas.no   Fact Sheet for Healthcare Providers:   BankingDealers.co.za    This test is not yet approved or cleared by the  [place] FDA and  has been authorized for detection and/or diagnosis of SARS-CoV-2 by FDA under an Emergency Use Authorization (EUA).  This EUA will remain in effect (meaning this tes t can be used) for the duration of  the COVID-19 declaration under Section 564(b)(1) of the Act, 21 U.S.C. section 360-bbb-3(b)(1), unless the authorization is terminated or revoked sooner.  Performed at [facility], [place] [address] [place], [place] [address]          Radiology Studies: DG Chest Port 1 View  Result Date: 12/17/2019 CLINICAL DATA:  Short of breath, bilateral lower extremity edema EXAM: PORTABLE CHEST 1 VIEW COMPARISON:  09/08/2019 FINDINGS: Single frontal view of the chest demonstrates an enlarged cardiac silhouette. Thoracic aorta is ectatic. Bibasilar veiling opacities are seen consistent with effusions and consolidation. Progressive central vascular congestion. No pneumothorax. IMPRESSION: 1. Findings consistent with congestive heart failure. Electronically Signed   By: Randa Ngo M.D.   On: 12/19/2019 18:50   ECHOCARDIOGRAM COMPLETE  Result Date: 12/14/2019    ECHOCARDIOGRAM REPORT   Patient Name:   FELIPE PALUCH Date of Exam: 12/14/2019 Medical Rec #:  [id number]   Height:       48.0 in Accession #:    [id number]  Weight:       240.0 lb Date of Birth:  1926-11-25    BSA:          1.716 m Patient Age:    16 years    BP:           102/80 mmHg Patient Gender: M           HR:           108 bpm. Exam Location:  Inpatient Procedure: 2D Echo Indications:    CHF 428  History:        Patient has prior history of Echocardiogram examinations, most                 recent 08/10/2019. CHF; Risk Factors:Diabetes, Hypertension and                 Former Smoker.  Sonographer:    Jannett Celestine RDCS (AE) Referring Phys: [id number] Eben Burow  Sonographer Comments: additional apical images obtained at end of study w/ particularly more gain. see comments IMPRESSIONS  1. Diffuse hypokinesis worse  in the septum and apex Spontaneous contrast in LV apex no formed thrombus noted . Left ventricular ejection fraction, by  estimation, is 25%. The left ventricle has normal function. The left ventricle demonstrates global hypokinesis. The left ventricular internal cavity size was mildly dilated. There is moderate left ventricular hypertrophy. Left ventricular diastolic parameters are indeterminate.  2. Right ventricular systolic function is normal. The right ventricular size is normal.  3. Left atrial size was moderately dilated.  4. The mitral valve is normal in structure. Mild mitral valve regurgitation. No evidence of mitral stenosis.  5. The aortic valve is tricuspid. Aortic valve regurgitation is not visualized. Mild to moderate aortic valve sclerosis/calcification is present, without any evidence of aortic stenosis.  6. Aortic dilatation noted. There is moderate dilatation of the ascending aorta measuring 43 mm.  7. The inferior vena cava is normal in size with greater than 50% respiratory variability, suggesting right atrial pressure of 3 mmHg. FINDINGS  Left Ventricle: Diffuse hypokinesis worse in the septum and apex Spontaneous contrast in LV apex no formed thrombus noted. Left ventricular ejection fraction, by estimation, is 25%. The left ventricle has normal function. The left ventricle demonstrates  global hypokinesis. The left ventricular internal cavity size was mildly dilated. There is moderate left ventricular hypertrophy. Left ventricular diastolic parameters are indeterminate. Right Ventricle: The right ventricular size is normal. No increase in right ventricular wall thickness. Right ventricular systolic function is normal. Left Atrium: Left atrial size was moderately dilated. Right Atrium: Right atrial size was normal in size. Pericardium: There is no evidence of pericardial effusion. Mitral Valve: The mitral valve is normal in structure. Normal mobility of the mitral valve leaflets. Mild mitral  valve regurgitation. No evidence of mitral valve stenosis. Tricuspid Valve: The tricuspid valve is normal in structure. Tricuspid valve regurgitation is mild . No evidence of tricuspid stenosis. Aortic Valve: The aortic valve is tricuspid. Aortic valve regurgitation is not visualized. Mild to moderate aortic valve sclerosis/calcification is present, without any evidence of aortic stenosis. Pulmonic Valve: The pulmonic valve was normal in structure. Pulmonic valve regurgitation is not visualized. No evidence of pulmonic stenosis. Aorta: The aortic root is normal in size and structure and aortic dilatation noted. There is moderate dilatation of the ascending aorta measuring 43 mm. Venous: The inferior vena cava is normal in size with greater than 50% respiratory variability, suggesting right atrial pressure of 3 mmHg. IAS/Shunts: No atrial level shunt detected by color flow Doppler.  LEFT VENTRICLE PLAX 2D LVIDd:         4.50 cm LVIDs:         3.30 cm LV PW:         1.40 cm LV IVS:        1.30 cm LVOT diam:     2.20 cm LV SV:         33 LV SV Index:   19 LVOT Area:     3.80 cm  LEFT ATRIUM             Index       RIGHT ATRIUM           Index LA diam:        4.50 cm 2.62 cm/m  RA Area:     19.90 cm LA Vol (A2C):   52.9 ml 30.84 ml/m RA Volume:   51.20 ml  29.85 ml/m LA Vol (A4C):   44.8 ml 26.11 ml/m LA Biplane Vol: 49.6 ml 28.91 ml/m  AORTIC VALVE LVOT Vmax:   62.00 cm/s LVOT Vmean:  46.400 cm/s LVOT VTI:    0.088 m  AORTA Ao  Root diam: 3.50 cm MITRAL VALVE MV Area (PHT): 4.31 cm     SHUNTS MV Decel Time: 176 msec     Systemic VTI:  0.09 m MV E velocity: 118.00 cm/s  Systemic Diam: 2.20 cm Jenkins Rouge MD Electronically signed by Jenkins Rouge MD Signature Date/Time: 12/14/2019/3:18:05 PM    Final         Scheduled Meds: . apixaban  2.5 mg Oral BID  . [START ON 12/15/2019] famotidine  20 mg Oral Daily  . midodrine  2.5 mg Oral TID WC  . potassium chloride SA  20 mEq Oral Daily  . sodium chloride flush   3 mL Intravenous Q12H   Continuous Infusions: . sodium chloride    . furosemide (LASIX) infusion 4 mg/hr (12/14/19 1257)     LOS: 1 day    Time spent: 30 minutes    Barb Merino, MD [facility] Pager [phone]

## 2019-12-14 NOTE — Progress Notes (Signed)
10:53 Stuck pt. Using U/S as no visible veins noted. Attempting to get in vein when pt. Stated " you dont know what you're doing." Take it out!"  Explained to pt. that he was a hard stick and U/S had to be used. He said " get it out!" Pt. Refused to be stuck again. Informed a RN at the desk and she said she would let the pt's RN know.

## 2019-12-14 NOTE — ED Notes (Signed)
Attempted report 2x

## 2019-12-14 NOTE — Progress Notes (Signed)
   12/14/19 2101  Assess: MEWS Score  Temp 97.6 F (36.4 C)  BP (!) 82/67  Pulse Rate 99  Resp 12  SpO2 98 %  Assess: MEWS Score  MEWS Temp 0  MEWS Systolic 1  MEWS Pulse 0  MEWS RR 1  MEWS LOC 0  MEWS Score 2  MEWS Score Color Yellow  Assess: if the MEWS score is Yellow or Red  Were vital signs taken at a resting state? Yes  Focused Assessment No change from prior assessment  Early Detection of Sepsis Score *See Row Information* Medium  MEWS guidelines implemented *See Row Information* No, previously yellow, continue vital signs every 4 hours  Treat  MEWS Interventions Administered scheduled meds/treatments  Pain Score 0  Notify: Charge Nurse/RN  Name of Charge Nurse/RN Notified [place] RN  Date Charge Nurse/RN Notified 12/14/19  Time Charge Nurse/RN Notified 2215  Document  Progress note created (see row info) Yes  -Low BP, on lasix drip -Midodrine po QAM. BP trending up

## 2019-12-14 NOTE — Progress Notes (Signed)
   12/14/19 1845  Assess: MEWS Score  BP (!) 70/58  Pulse Rate (!) 56  Resp 18  SpO2 98 %  O2 Device Room Air  Patient Activity (if Appropriate) In bed  Assess: MEWS Score  MEWS Temp 0  MEWS Systolic 2  MEWS Pulse 0  MEWS RR 0  MEWS LOC 0  MEWS Score 2  MEWS Score Color Yellow  Assess: if the MEWS score is Yellow or Red  Were vital signs taken at a resting state? Yes  Focused Assessment No change from prior assessment  Treat  MEWS Interventions Other (Comment)  Pain Scale 0-10  Pain Score 0   - MEWS score fired YELLOW due to low BP - Pt. Is already being treated and has scheduled Midodrine - Pt. Is asymptomatic, no further interventions at this time

## 2019-12-14 NOTE — Plan of Care (Signed)

## 2019-12-14 NOTE — Consult Note (Signed)
Consultation Note Date: 12/14/2019   Patient Name: [name] [name]  DOB: 1927/02/09  MRN: [id number]  Age / Sex: 86 y.o., male  PCP: [name], MD Referring Physician: Chotiner, Yevonne Aline, MD  Reason for Consultation: Establishing goals of care  HPI/Patient Profile: 86 y.o. male  with past medical history of DVT, Atrial fibrillation, CKD, hypertension, bilateral AKA presents to the emergency department  12/14/2019 with swelling of his lower extremities and scrotum for 2-3 days. He also reports shortness of breath that is progressively worse over the past few days. In the ED, he is found to have significant edema of his bilateral lower extremity stumps as well as scrotum and penis. BNP is elevated at 3410 and troponin is mildly elevated at 124 and 94 on repeat. Creatinine is 2.35, which is above his baseline. COVID test is positive. Chest x-ray is consistent with congestive heart failure. He has been treated with IV lasix.  Palliative care was consulted to assist with goals of care.  Clinical Assessment and Goals of Care: I have reviewed medical records including EPIC notes, labs and imaging, and examined the patient. I then spoke with patient's daughter Lynelle Smoke  to discuss diagnosis, prognosis, GOC, EOL wishes, disposition, and options.  I introduced Palliative Medicine as specialized medical care for people living with serious illness. It focuses on providing relief from the symptoms and stress of a serious illness.   We discussed a brief life review of the patient. He has lived in [place] his entire life. 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 He had 2 sons with his first wife, but is estranged from both of them. Tammy is the patient's step daughter from a later marriage. Tammy states the patient has "outlived all his other family", she is only one remaining. Patient has lived with Tammy for the past 2  years, after his bilateral amputation.  As far as functional and nutritional status, he uses an electric wheelchair for mobility. He is able to feed himself, and Tammy reports that he enjoys eating and has never adhered to any health-related dietary restrictions. Tammy helps him with dressing and bathing.   We discussed his current illness and what it means in the larger context of his ongoing co-morbidities.  Natural disease trajectory of heart failure was discussed. Tammy seems to have very realistic expectations regarding his present state of health.  I attempted to elicit values and goals of care important to the patient. Tammy shares that he values his independence. He has previously asked Tammy "not to put him in a home".   The difference between aggressive medical intervention and comfort care was considered in light of the patient's goals of care.   Advanced directives, concepts specific to code status, artifical feeding and hydration, and rehospitalization were considered and discussed. Tammy shares that she has previously discussed code status with Mr Sanchez, and that he would not want to be resuscitated.   Hospice and Palliative Care services outpatient were explained and offered. I provided education that hospice  care could allow him to stay at home, manage any illness related symptoms, and avoid rehospitalization. Tammy seems very receptive to hospice care, just requests some time to "think about it".   Questions and concerns were addressed.  Tammy was encouraged to call with questions or concerns.   Primary decision maker: Tama Gander    SUMMARY OF RECOMMENDATIONS   - code status changed to DNR/DNI - daughter Lynelle Smoke seems open and receptive to hospice care at home - PMT will follow-up with Tammy tommorrow  Code Status/Advance Care Planning:  DNR  Symptom Management:   Per primary team at this time  Palliative Prophylaxis:   Frequent Pain Assessment, Oral Care and Turn  Reposition  Prognosis:   Likely poor overall, considering advanced age, acute heart failure, and multiple co-morbidities  Discharge Planning: To Be Determined      Primary Diagnoses: Present on Admission: . Acute on chronic systolic CHF (congestive heart failure) ([place]) . HTN (hypertension) . Scrotal swelling   I have reviewed the medical record, interviewed the patient and family, and examined the patient. The following aspects are pertinent.  Past Medical History:  Diagnosis Date  . CHF (congestive heart failure) ([place])   . Chronic kidney disease    RENAL INSUFICCIENCY  . Coronary artery disease   . Diabetes mellitus without complication ([place])   . DVT (deep venous thrombosis) ([place])   . Dysrhythmia   . Gout   . Hypertension   . Lymphadenitis 08/10/2012  . Psoriasis     Family History  Problem Relation Age of Onset  . Hypertension Mother   . Stroke Mother   . Hypertension Father    Scheduled Meds: . apixaban  2.5 mg Oral BID  . [START ON 12/15/2019] famotidine  20 mg Oral Daily  . furosemide  20 mg Intravenous Once  . midodrine  2.5 mg Oral TID WC  . potassium chloride SA  20 mEq Oral Daily  . sodium chloride flush  3 mL Intravenous Q12H   Continuous Infusions: . sodium chloride    . furosemide (LASIX) infusion 4 mg/hr (12/14/19 1257)   PRN Meds:.sodium chloride, acetaminophen, ondansetron (ZOFRAN) IV, oxyCODONE, sodium chloride flush Medications Prior to Admission:  Prior to Admission medications   Medication Sig Start Date End Date Taking? Authorizing Provider  ELIQUIS 5 MG TABS tablet TAKE 1 TABLET BY MOUTH TWICE DAILY Patient taking differently: Take 5 mg by mouth 2 (two) times daily.  11/22/19  Yes Fulp, Cammie, MD  famotidine (PEPCID) 20 MG tablet Take 1 tablet (20 mg total) by mouth 2 (two) times daily. To reduce stomach acid 08/26/19  Yes Fulp, Cammie, MD  fluocinolone (SYNALAR) 0.025 % cream Apply topically 2 (two) times daily. To the scalp and sparingly  to the face 10/13/19  Yes McClung, Dionne Bucy, PA-C  furosemide (LASIX) 40 MG tablet Take 1 tablet (40 mg total) by mouth 2 (two) times daily. 09/11/19  Yes Mercy Riding, MD  gabapentin (NEURONTIN) 100 MG capsule Take 1 capsule (100 mg total) by mouth See admin instructions. Take 200mg  in the morning, 200mg  every evening and 300mg  at bedtime. 10/13/19 12/14/19 Yes McClung, Dionne Bucy, PA-C  hydrocortisone cream 1 % Apply topically 2 (two) times daily. 11/25/18  Yes Shelly Coss, MD  loperamide (IMODIUM) 2 MG capsule TAKE 1 CAPSULE BY MOUTH AS NEEDED FOR DIARRHEA OR LOOSE STOOLS. DO NOT TAKE MORE THAN 6 IN A DAY Patient taking differently: Take 2 mg by mouth as needed for diarrhea or loose  stools (NOT TO EXCEED 6 TABLETS (A TOTAL OF 12 MG)/24 HOURS).  11/22/19  Yes McClung, Angela M, PA-C  midodrine (PROAMATINE) 2.5 MG tablet Take 1 tablet (2.5 mg total) by mouth 3 (three) times daily with meals. 09/11/19  Yes Mercy Riding, MD  Potassium Chloride ER 20 MEQ TBCR Take 20 mEq by mouth daily. 08/12/19 12/14/19 Yes Ghimire, Dante Gang, MD  triamcinolone ointment (KENALOG) 0.1 % Apply 1 application topically 2 (two) times daily as needed (skin irritation.).  07/27/19  Yes [provider]  silver sulfADIAZINE (SILVADENE) 1 % cream Apply 1 application topically daily. Once daily to area of skin irritation on the right stump x 10 days then as needed Patient not taking: Reported on 12/14/2019 05/27/19   [name], MD   No Known Allergies Review of Systems  Unable to perform ROS: Other    Physical Exam Vitals reviewed.  Cardiovascular:     Comments: A-fib on monitor Pulmonary:     Effort: Pulmonary effort is normal.  Genitourinary:    Testes:        Right: Swelling present.        Left: Swelling present.  Musculoskeletal:     Right lower leg: Edema present.     Left lower leg: Edema present.     Right Lower Extremity: Right leg is amputated above knee.     Left Lower Extremity: Left leg is amputated  above knee.  Neurological:     Mental Status: He is alert.     Comments: Oriented to person and place     Vital Signs: BP 102/80   Pulse 80   Temp 97.6 F (36.4 C) (Oral)   Resp 13   Wt 108.9 kg   SpO2 97%   BMI 73.24 kg/m  Pain Scale: 0-10   Pain Score: 8    SpO2: SpO2: 97 % O2 Device:SpO2: 97 % O2 Flow Rate: .   LBM:   Baseline Weight: Weight: 108.9 kg Most recent weight: Weight: 108.9 kg      Palliative Assessment/Data: 40%     Time In: 16:00 Time Out: 17:10 Time Total: 70 minutes Greater than 50%  of this time was spent counseling and coordinating care related to the above assessment and plan.  Signed by: Lavena Bullion, NP   Please contact Palliative Medicine Team phone at [phone] for questions and concerns.  For individual provider: See Shea Evans

## 2019-12-15 DIAGNOSIS — N189 Chronic kidney disease, unspecified: Secondary | ICD-10-CM

## 2019-12-15 DIAGNOSIS — I5023 Acute on chronic systolic (congestive) heart failure: Secondary | ICD-10-CM

## 2019-12-15 DIAGNOSIS — Z7189 Other specified counseling: Secondary | ICD-10-CM

## 2019-12-15 DIAGNOSIS — Z515 Encounter for palliative care: Secondary | ICD-10-CM

## 2019-12-15 DIAGNOSIS — U071 COVID-19: Secondary | ICD-10-CM

## 2019-12-15 DIAGNOSIS — N179 Acute kidney failure, unspecified: Secondary | ICD-10-CM

## 2019-12-15 DIAGNOSIS — Z66 Do not resuscitate: Secondary | ICD-10-CM

## 2019-12-15 LAB — BASIC METABOLIC PANEL
Anion gap: 12 (ref 5–15)
BUN: 51 mg/dL — ABNORMAL HIGH (ref 8–23)
CO2: 20 mmol/L — ABNORMAL LOW (ref 22–32)
Calcium: 8.4 mg/dL — ABNORMAL LOW (ref 8.9–10.3)
Chloride: 104 mmol/L (ref 98–111)
Creatinine, Ser: 2.77 mg/dL — ABNORMAL HIGH (ref 0.61–1.24)
GFR calc Af Amer: 22 mL/min — ABNORMAL LOW (ref >60)
GFR calc non Af Amer: 19 mL/min — ABNORMAL LOW (ref >60)
Glucose, Bld: 46 mg/dL — ABNORMAL LOW (ref 70–99)
Potassium: 5.7 mmol/L — ABNORMAL HIGH (ref 3.5–5.1)
Sodium: 136 mmol/L (ref 135–145)

## 2019-12-15 LAB — GLUCOSE, CAPILLARY
Glucose-Capillary: 36 mg/dL — CL (ref 70–99)
Glucose-Capillary: 77 mg/dL (ref 70–99)

## 2019-12-15 MED ORDER — DEXTROSE 50 % IV SOLN
25.0000 mL | Freq: Once | INTRAVENOUS | Status: AC
  Administered 2019-12-15: 25 mL via INTRAVENOUS

## 2019-12-15 MED ORDER — BIOTENE DRY MOUTH MT LIQD: 15.0000 mL | OROMUCOSAL | Status: DC | PRN

## 2019-12-15 MED ORDER — LORAZEPAM 1 MG PO TABS: 1.0000 mg | ORAL_TABLET | ORAL | Status: DC | PRN

## 2019-12-15 MED ORDER — GLYCOPYRROLATE 1 MG PO TABS
1.0000 mg | ORAL_TABLET | ORAL | Status: DC | PRN
  Filled 2019-12-15: qty 1

## 2019-12-15 MED ORDER — LORAZEPAM 2 MG/ML PO CONC: 1.0000 mg | ORAL | Status: DC | PRN

## 2019-12-15 MED ORDER — GLYCOPYRROLATE 0.2 MG/ML IJ SOLN: 0.2000 mg | INTRAMUSCULAR | Status: DC | PRN

## 2019-12-15 MED ORDER — OXYCODONE HCL 20 MG/ML PO CONC
10.0000 mg | ORAL | Status: DC | PRN
  Administered 2019-12-16: 10 mg via ORAL
  Filled 2019-12-15: qty 1

## 2019-12-15 MED ORDER — HALOPERIDOL LACTATE 5 MG/ML IJ SOLN: 0.5000 mg | INTRAMUSCULAR | Status: DC | PRN

## 2019-12-15 MED ORDER — HYDROMORPHONE HCL 1 MG/ML IJ SOLN: 1.0000 mg | INTRAMUSCULAR | Status: DC | PRN

## 2019-12-15 MED ORDER — OXYCODONE HCL 5 MG PO TABS: 10.0000 mg | ORAL_TABLET | ORAL | Status: DC

## 2019-12-15 MED ORDER — HALOPERIDOL LACTATE 2 MG/ML PO CONC
0.5000 mg | ORAL | Status: DC | PRN
  Filled 2019-12-15: qty 0.3

## 2019-12-15 MED ORDER — HALOPERIDOL 0.5 MG PO TABS
0.5000 mg | ORAL_TABLET | ORAL | Status: DC | PRN
  Filled 2019-12-15: qty 1

## 2019-12-15 MED ORDER — POLYVINYL ALCOHOL 1.4 % OP SOLN
1.0000 [drp] | Freq: Four times a day (QID) | OPHTHALMIC | Status: DC | PRN
  Filled 2019-12-15: qty 15

## 2019-12-15 MED ORDER — LORAZEPAM 2 MG/ML IJ SOLN: 1.0000 mg | INTRAMUSCULAR | Status: DC | PRN

## 2019-12-15 MED ORDER — DEXTROSE 50 % IV SOLN
INTRAVENOUS | Status: AC
  Filled 2019-12-15: qty 50

## 2019-12-15 NOTE — Progress Notes (Signed)
Spoke with daughter, [name] [name], regarding visitation with her father. She stated she was COVID negative. We discussed that at this moment in time, her dad is awake and alert with no signs of imminent death, vital signs were stable, with a soft blood pressure. While he was placed in comfort care the risk for exposure and spreading COVID was higher than the risk of death at this time. She said she understood and was agreed it was best to wait to see her father. She was told that if his condition negatively  changes we would be in touch with her for a visit for only one visitor. Again, she stated she understood and agreed this was the best plan of care. She was able to speak to her father through the phone, to which they held a light conversation and was able to respond appropriately.

## 2019-12-15 NOTE — Progress Notes (Addendum)
Daily Progress Note   Patient Name: [name] [name]       Date: 12/15/2019 DOB: 11-20-1926  Age: 86 y.o. MRN#: [id number] Attending Physician: Barb Merino, MD Primary Care Physician: [name], MD Admit Date: 12/27/2019  Reason for Consultation/Follow-up: Establishing goals of care  Subjective: Called patient's daughter for followup. Mr Fults had episode of severe hypoglycemia last night with glucose of 36. He is increasing SOB. He is at end of life. Tammie shares that patient has had a long life and she is at peace with him dying. She wishes she could bring him home but she has a 52 year old son and they are not comfortable with him dying in their home. She wishes for him to go to a residential Hospice home. We discussed what hospice care and comfort measures look like. She is agreeable. She is aware that covid positive beds are limited and patient my have to go to [facility] for hosp[facility] bed- she is in agreement with this.    Length of Stay: 2  Current Medications: Scheduled Meds:  . famotidine  20 mg Oral Daily  . midodrine  2.5 mg Oral TID WC  . oxyCODONE  10 mg Oral Q2H    Continuous Infusions: . furosemide (LASIX) infusion 4 mg/hr (12/14/19 1257)    PRN Meds: acetaminophen, antiseptic oral rinse, glycopyrrolate **OR** glycopyrrolate **OR** glycopyrrolate, haloperidol **OR** haloperidol **OR** haloperidol lactate, LORazepam **OR** LORazepam **OR** LORazepam, ondansetron (ZOFRAN) IV, polyvinyl alcohol      Vital Signs: BP 111/83 (BP Location: Left Arm)   Pulse (!) 103   Temp 98 F (36.7 C)   Resp 19   Wt 80.4 kg   SpO2 100%   BMI 54.09 kg/m  SpO2: SpO2: 100 % O2 Device: O2 Device: Room Air O2 Flow Rate:    Intake/output summary:   Intake/Output Summary (Last 24  hours) at 12/15/2019 1459 Last data filed at 12/15/2019 0845 Gross per 24 hour  Intake 20 ml  Output --  Net 20 ml   LBM:   Baseline Weight: Weight: 108.9 kg Most recent weight: Weight: 80.4 kg       Palliative Performance Status: 20%   Patient Active Problem List   Diagnosis Date Noted  . Acute kidney injury superimposed on CKD ([place]) 12/12/2019  . Atrial fibrillation, chronic ([place]) 12/18/2019  .  Acute on chronic diastolic (congestive) heart failure ([place]) 09/08/2019  . Acute on chronic systolic CHF (congestive heart failure), NYHA class 1 ([place]) 09/08/2019  . Acute on chronic combined systolic and diastolic CHF (congestive heart failure) ([place]) 08/09/2019  . PAF (paroxysmal atrial fibrillation) ([place])   . Scrotal swelling   . Hypotension   . CHF exacerbation ([place]) 03/18/2019  . Acute on chronic systolic CHF (congestive heart failure) ([place]) 11/23/2018  . Troponin level elevated 11/23/2018  . Acute on chronic heart failure ([place])   . Atrial fibrillation and flutter ([place])   . Atrial fibrillation with RVR ([place]) 07/15/2018  . S/P AKA (above knee amputation) bilateral ([place])   . Pressure injury of skin 10/04/2017  . Hyperkalemia 11/17/2016  . Venous stasis ulcer ([place]) 11/17/2016  . Venous stasis ulcer of lower extremity ([place]) 05/23/2015  . Cellulitis of both lower extremities 10/18/2014  . Chronic renal insufficiency, stage 3 (moderate) ([place]) 10/18/2014  . HTN (hypertension), benign 10/18/2014  . Chronic diastolic CHF (congestive heart failure) ([place]) 10/18/2014  . Lower extremity cellulitis 10/18/2014  . PAD (peripheral artery disease) ([place]) 02/02/2014  . Atherosclerosis of native arteries of the extremities with ulceration ([place]) 07/22/2013  . Acute renal failure ([place]) 11/15/2012  . Fall 11/15/2012  . Diabetic ulcer of left foot ([place]) 10/15/2012  . Diabetes mellitus ([place]) 10/15/2012  . Lymphadenitis 08/07/2012  . Cellulitis 08/07/2012  . HTN (hypertension) 08/05/2012  . Renal  insufficiency 08/05/2012  . Tobacco abuse 08/05/2012  . Gout 08/05/2012  . Hyperglycemia 08/05/2012    Palliative Care Assessment & Plan   Patient Profile: 86 y.o. male  with past medical history of DVT, Atrial fibrillation, CKD, hypertension, bilateral AKA presents to the emergency department  12/07/2019 with swelling of his lower extremities and scrotum for 2-3 days. He also reports shortness of breath that is progressively worse over the past few days. In the ED, he is found to have significant edema of his bilateral lower extremity stumps as well as scrotum and penis. BNP is elevated at 3410 and troponin is mildly elevated at 124 and 94 on repeat. Creatinine is 2.35, which is above his baseline. COVID test is positive. Chest x-ray is consistent with congestive heart failure. He has been treated with IV lasix.  Palliative care was consulted to assist with goals of care.  Assessment/Recommendations/Plan   Transition to full comfort measures only  Comfort medications as ordered  TOC referral for Residential Hospice placement  Goals of Care and Additional Recommendations:  Limitations on Scope of Treatment: Full Comfort Care  Code Status:  DNR  Prognosis:   < 2 weeks due to advanced hear failure, acute on chronic kidney injury, Covid + in unvaccinated patient with plans for full comfort measures only  Discharge Planning:  Hospice facility  Care plan was discussed with patient's daughter.   Thank you for allowing the Palliative Medicine Team to assist in the care of this patient.   Time In: 1415 Time Out: 1455 Total Time 40 mins Prolonged Time Billed no      Greater than 50%  of this time was spent counseling and coordinating care related to the above assessment and plan.  Mariana Kaufman, AGNP-C Palliative Medicine   Please contact Palliative Medicine Team phone at [phone] for questions and concerns.

## 2019-12-15 NOTE — Progress Notes (Signed)
MD has also been messaged through [facility]. Will wait for orders. Pt is lethargic and slow to respond. Pt has a hard time coordinating swallow reflex and took meds, crushed in applesauce. Pt needed heavy coaching to help swallow. Changed diet to pureed, per nursing judgment.

## 2019-12-15 NOTE — Social Work (Signed)
SW consult for hospice home placement. Palliative Care made dtr, Tammie aware of barrier to local hospice placement due to COVID +. Spoke to Bevely Palmer with [facility] who confirmed they are not accepting COVID + at [facility] or [facility] hospice homes. Also confirmed [facility] unable to accept COVID +.    Hillary with [facility] confirmed they are unable to offer for [employer] [employer], but their [facility] hosp[facility] is able to accommodate COVID + patients. SW spoke to pt's dtr and provided only offer from [facility] which she has refused due to distance. Team updated.    Wandra Feinstein, MSW, LCSW [phone] (coverage)

## 2019-12-15 NOTE — Progress Notes (Signed)
Pt's niece, Earlie Server called for an update. She was told to call the daughter and discuss any information she may need. She was given Tamie's cell phone number.

## 2019-12-15 NOTE — Progress Notes (Signed)
PROGRESS NOTE    [name] [name]  MHD:[id number] DOB: Jan 24, 1927 DOA: 11/30/2019 PCP: [name], MD    Brief Narrative:  [name] [name] is a 86 y.o. male with medical history significant for hx of DVT, a-fib, CKD, HTN, bilateral AKA.  Presents from home with swelling of lower extremities and scrotum.  He also reports having shortness of breath that is progressively worsened over the last few days.  He reports he has not had any chest pain or pressure but does have intermittent palpitations which is not uncommon for him.  States he has not had any fever or cough.  Reports he has been taking his medications at home as prescribed.  He reports that the swelling of his legs and scrotum began 2 to 3 days ago and is progressively gotten worse.  He states he has been able to urinate but feels like it is harder to start a stream than previously.   At the emergency room, he was found with significant edema bilateral lower extremity stumps as well as scrotum and penis.  Was given bolus IV Lasix.  BNP elevated more than 3000.  Troponin mildly elevated but flat.  Without any chest pain.  Creatinine elevated from baseline.  Admitted with acute CHF exacerbation.  Last known ejection fraction 25%. Also found to have COVID-19 positivity.  Unvaccinated.  Patient declined to get any vaccines.   Assessment & Plan:   Principal Problem:   Acute on chronic systolic CHF (congestive heart failure) (HCC) Active Problems:   HTN (hypertension)   S/P AKA (above knee amputation) bilateral (HCC)   Scrotal swelling   Acute kidney injury superimposed on CKD (HCC)   Atrial fibrillation, chronic (HCC)  Acute on chronic systolic congestive heart failure: With acute renal failure. Not quite responding to IV diuresis.  We will continue infusion Lasix.  Repeat echocardiogram showed ejection fraction 25%. Not tolerating antihypertensives, beta-blockers or ACE inhibitors.  We will continue to monitor. Previous ejection fraction  15%-45%-25%. With poor cardiovascular health, systolic heart failure and recurrent admission and debility patient does qualify for hospice level of care. Followed by palliative care. We will continue discussion about possible home with hospice in place.  Acute kidney injury superimposed on chronic kidney disease stage IV: Due to #1.  Will diurese and monitor closely.  Chronic atrial fibrillation: Rate controlled A. fib.  Eliquis renally dosed.  Status post above-knee amputation bilateral: Stable.  Hypoglycemia: Due to poor appetite, renal failure and failure to thrive.  Will treat with dextrose.  Liberalize oral diet and carbohydrate intake.  COVID-19 virus infection in an unvaccinated adult: Does not have much pulmonary symptoms.  His most of the symptoms related to congestive heart failure and peripheral edema. We will continue to treat him with chest physiotherapy, bronchodilator therapy and incentive spirometry as much as he can tolerate. Given oxygen to keep saturations more than 90%. Currently no indication to treat with Covid directed therapies.  Will monitor closely. COVID-19 Labs  No results for input(s): DDIMER, FERRITIN, LDH, CRP in the last 72 hours.  Lab Results  Component Value Date   SARSCOV2NAA POSITIVE (A) 12/24/2019   [place] NEGATIVE 09/08/2019   [place] NEGATIVE 08/09/2019   [place] NEGATIVE 03/18/2019   SpO2: 100 %  Failure to thrive/multiple medical comorbidities/frailty and advanced physical debility: Goal of care discussion done with patient's daughter with whom patient lives.  She has been exploring about options of home with hospice.  Patient is appropriate candidate to go home with hospice in place  given his poor clinical situation and also short span of life. Followed by palliative care. Appreciate their input.  DVT prophylaxis: apixaban (ELIQUIS) tablet 2.5 mg Start: 12/14/19 2200 apixaban (ELIQUIS) tablet 2.5 mg   Code Status:  DNR Family Communication: Daughter on the phone.  We discussed about treatment options, home with hospice options.  She is worried about him going home with Covid positivity.  She is interested to learn about home hospice options. Disposition Plan: Status is: Inpatient  Remains inpatient appropriate because:IV treatments appropriate due to intensity of illness or inability to take PO and Inpatient level of care appropriate due to severity of illness   Dispo: The patient is from: Home              Anticipated d/c is to: Home              Anticipated d/c date is: 3 days              Patient currently is not medically stable to d/c.         Consultants:   Palliative medicine  Procedures:   None  Antimicrobials:   None   Subjective: Patient seen and examined.  Remains lethargic.  Early morning, he was noted to have blood sugars of 36, blood sugar 46 on his chemistry.  Responded to dextrose infusion.  Has not been eating anything.  He is not diabetic.  He is not quite sure how he is feeling. he says he does not feel well.    Objective: Vitals:   12/15/19 0310 12/15/19 0620 12/15/19 0800 12/15/19 1126  BP:  (!) 77/50 (!) 88/62 111/83  Pulse: (!) 101 80 (!) 103   Resp: 13 12 19    Temp:  98.4 F (36.9 C) 97.9 F (36.6 C) 98 F (36.7 C)  TempSrc:  Oral Axillary   SpO2: 99% 100% 100%   Weight:        Intake/Output Summary (Last 24 hours) at 12/15/2019 1352 Last data filed at 12/15/2019 0845 Gross per 24 hour  Intake 20 ml  Output --  Net 20 ml   Filed Weights   12/21/2019 1210 12/15/19 0230  Weight: 108.9 kg 80.4 kg    Examination:  General exam: Chronically sick looking.  Not in any distress.  On room air. Very frail gentleman who looks sick and profoundly debilitated. Respiratory system: No added sounds.  Some conducted airway sounds. Cardiovascular system: S1 & S2 heard, regular. Gastrointestinal system: Abdomen is nondistended, soft and nontender. No  organomegaly or masses felt. Normal bowel sounds heard. Central nervous system: Mostly alert and oriented x1-2. Extremities: Bilateral above-knee amputation stump clean and dry. 2+ edema on the upper thighs.  3+ edema on penis and scrotum.    Data Reviewed: I have personally reviewed following labs and imaging studies  CBC: Recent Labs  Lab 12/08/2019 1752  WBC 5.2  NEUTROABS 4.5  HGB 15.9  HCT 49.7  MCV 106.0*  PLT PLATELET CLUMPS NOTED ON SMEAR, UNABLE TO ESTIMATE   Basic Metabolic Panel: Recent Labs  Lab 12/03/2019 1752 12/15/19 0509  NA 135 136  K 5.0 5.7*  CL 103 104  CO2 20* 20*  GLUCOSE 134* 46*  BUN 43* 51*  CREATININE 2.35* 2.77*  CALCIUM 8.6* 8.4*  MG 2.1  --    GFR: Estimated Creatinine Clearance: 11.2 mL/min (A) (by C-G formula based on SCr of 2.77 mg/dL (H)). Liver Function Tests: Recent Labs  Lab 11/30/2019 1752  AST 33  ALT 21  ALKPHOS 106  BILITOT 3.5*  PROT 5.9*  ALBUMIN 2.3*   Recent Labs  Lab 12/04/2019 1752  LIPASE 17   No results for input(s): AMMONIA in the last 168 hours. Coagulation Profile: No results for input(s): INR, PROTIME in the last 168 hours. Cardiac Enzymes: No results for input(s): CKTOTAL, CKMB, CKMBINDEX, TROPONINI in the last 168 hours. BNP (last 3 results) No results for input(s): PROBNP in the last 8760 hours. HbA1C: No results for input(s): HGBA1C in the last 72 hours. CBG: Recent Labs  Lab 12/14/19 0135 12/15/19 0917 12/15/19 1236  GLUCAP 85 36* 77   Lipid Profile: No results for input(s): CHOL, HDL, LDLCALC, TRIG, CHOLHDL, LDLDIRECT in the last 72 hours. Thyroid Function Tests: No results for input(s): TSH, T4TOTAL, FREET4, T3FREE, THYROIDAB in the last 72 hours. Anemia Panel: No results for input(s): VITAMINB12, FOLATE, FERRITIN, TIBC, IRON, RETICCTPCT in the last 72 hours. Sepsis Labs: No results for input(s): PROCALCITON, LATICACIDVEN in the last 168 hours.  Recent Results (from the past 240 hour(s))   SARS Coronavirus 2 by RT PCR (hospital order, performed in [facility] hospital lab) Nasopharyngeal Nasopharyngeal Swab     Status: Abnormal   Collection Time: 12/09/2019  6:19 PM   Specimen: Nasopharyngeal Swab  Result Value Ref Range Status   SARS Coronavirus 2 POSITIVE (A) NEGATIVE Final    Comment: RESULT CALLED TO, READ BACK BY AND VERIFIED WITH: G KOPP RN 2120 12/08/2019 A BROWNING (NOTE) SARS-CoV-2 target nucleic acids are DETECTED  SARS-CoV-2 RNA is generally detectable in upper respiratory specimens  during the acute phase of infection.  Positive results are indicative  of the presence of the identified virus, but do not rule out bacterial infection or co-infection with other pathogens not detected by the test.  Clinical correlation with patient history and  other diagnostic information is necessary to determine patient infection status.  The expected result is negative.  Fact Sheet for Patients:   StrictlyIdeas.no   Fact Sheet for Healthcare Providers:   BankingDealers.co.za    This test is not yet approved or cleared by the [place] FDA and  has been authorized for detection and/or diagnosis of SARS-CoV-2 by FDA under an Emergency Use Authorization (EUA).  This EUA will remain in effect (meaning this tes t can be used) for the duration of  the COVID-19 declaration under Section 564(b)(1) of the Act, 21 U.S.C. section 360-bbb-3(b)(1), unless the authorization is terminated or revoked sooner.  Performed at [facility], [place] [address], [place], [place] [address]          Radiology Studies: DG Chest Port 1 View  Result Date: 12/14/2019 CLINICAL DATA:  Short of breath, bilateral lower extremity edema EXAM: PORTABLE CHEST 1 VIEW COMPARISON:  09/08/2019 FINDINGS: Single frontal view of the chest demonstrates an enlarged cardiac silhouette. Thoracic aorta is ectatic. Bibasilar veiling opacities are seen consistent  with effusions and consolidation. Progressive central vascular congestion. No pneumothorax. IMPRESSION: 1. Findings consistent with congestive heart failure. Electronically Signed   By: Randa Ngo M.D.   On: 12/17/2019 18:50   ECHOCARDIOGRAM COMPLETE  Result Date: 12/14/2019    ECHOCARDIOGRAM REPORT   Patient Name:   KEANAN MELANDER Date of Exam: 12/14/2019 Medical Rec #:  [id number]   Height:       48.0 in Accession #:    [id number]  Weight:       240.0 lb Date of Birth:  [birth year]    BSA:  1.716 m Patient Age:    59 years    BP:           102/80 mmHg Patient Gender: M           HR:           108 bpm. Exam Location:  Inpatient Procedure: 2D Echo Indications:    CHF 428  History:        Patient has prior history of Echocardiogram examinations, most                 recent 08/10/2019. CHF; Risk Factors:Diabetes, Hypertension and                 Former Smoker.  Sonographer:    Jannett Celestine RDCS (AE) Referring Phys: [id number] Eben Burow  Sonographer Comments: additional apical images obtained at end of study w/ particularly more gain. see comments IMPRESSIONS  1. Diffuse hypokinesis worse in the septum and apex Spontaneous contrast in LV apex no formed thrombus noted . Left ventricular ejection fraction, by estimation, is 25%. The left ventricle has normal function. The left ventricle demonstrates global hypokinesis. The left ventricular internal cavity size was mildly dilated. There is moderate left ventricular hypertrophy. Left ventricular diastolic parameters are indeterminate.  2. Right ventricular systolic function is normal. The right ventricular size is normal.  3. Left atrial size was moderately dilated.  4. The mitral valve is normal in structure. Mild mitral valve regurgitation. No evidence of mitral stenosis.  5. The aortic valve is tricuspid. Aortic valve regurgitation is not visualized. Mild to moderate aortic valve sclerosis/calcification is present, without any evidence of aortic stenosis.   6. Aortic dilatation noted. There is moderate dilatation of the ascending aorta measuring 43 mm.  7. The inferior vena cava is normal in size with greater than 50% respiratory variability, suggesting right atrial pressure of 3 mmHg. FINDINGS  Left Ventricle: Diffuse hypokinesis worse in the septum and apex Spontaneous contrast in LV apex no formed thrombus noted. Left ventricular ejection fraction, by estimation, is 25%. The left ventricle has normal function. The left ventricle demonstrates  global hypokinesis. The left ventricular internal cavity size was mildly dilated. There is moderate left ventricular hypertrophy. Left ventricular diastolic parameters are indeterminate. Right Ventricle: The right ventricular size is normal. No increase in right ventricular wall thickness. Right ventricular systolic function is normal. Left Atrium: Left atrial size was moderately dilated. Right Atrium: Right atrial size was normal in size. Pericardium: There is no evidence of pericardial effusion. Mitral Valve: The mitral valve is normal in structure. Normal mobility of the mitral valve leaflets. Mild mitral valve regurgitation. No evidence of mitral valve stenosis. Tricuspid Valve: The tricuspid valve is normal in structure. Tricuspid valve regurgitation is mild . No evidence of tricuspid stenosis. Aortic Valve: The aortic valve is tricuspid. Aortic valve regurgitation is not visualized. Mild to moderate aortic valve sclerosis/calcification is present, without any evidence of aortic stenosis. Pulmonic Valve: The pulmonic valve was normal in structure. Pulmonic valve regurgitation is not visualized. No evidence of pulmonic stenosis. Aorta: The aortic root is normal in size and structure and aortic dilatation noted. There is moderate dilatation of the ascending aorta measuring 43 mm. Venous: The inferior vena cava is normal in size with greater than 50% respiratory variability, suggesting right atrial pressure of 3 mmHg.  IAS/Shunts: No atrial level shunt detected by color flow Doppler.  LEFT VENTRICLE PLAX 2D LVIDd:         4.50 cm LVIDs:  3.30 cm LV PW:         1.40 cm LV IVS:        1.30 cm LVOT diam:     2.20 cm LV SV:         33 LV SV Index:   19 LVOT Area:     3.80 cm  LEFT ATRIUM             Index       RIGHT ATRIUM           Index LA diam:        4.50 cm 2.62 cm/m  RA Area:     19.90 cm LA Vol (A2C):   52.9 ml 30.84 ml/m RA Volume:   51.20 ml  29.85 ml/m LA Vol (A4C):   44.8 ml 26.11 ml/m LA Biplane Vol: 49.6 ml 28.91 ml/m  AORTIC VALVE LVOT Vmax:   62.00 cm/s LVOT Vmean:  46.400 cm/s LVOT VTI:    0.088 m  AORTA Ao Root diam: 3.50 cm MITRAL VALVE MV Area (PHT): 4.31 cm     SHUNTS MV Decel Time: 176 msec     Systemic VTI:  0.09 m MV E velocity: 118.00 cm/s  Systemic Diam: 2.20 cm Jenkins Rouge MD Electronically signed by Jenkins Rouge MD Signature Date/Time: 12/14/2019/3:18:05 PM    Final         Scheduled Meds: . apixaban  2.5 mg Oral BID  . famotidine  20 mg Oral Daily  . midodrine  2.5 mg Oral TID WC  . sodium chloride flush  3 mL Intravenous Q12H   Continuous Infusions: . sodium chloride    . furosemide (LASIX) infusion 4 mg/hr (12/14/19 1257)     LOS: 2 days    Time spent: 30 minutes    Barb Merino, MD [facility] Pager [phone]

## 2019-12-15 NOTE — Progress Notes (Signed)
Dr. Sloan Leiter was paged to address potassium and glucose. Will wait for orders

## 2019-12-28 NOTE — Progress Notes (Signed)
Pharmacist Heart Failure Core Measure Documentation  Assessment: [name] [name] has an EF documented as 25% on TTE by 12/14/19.  Rationale: Heart failure patients with left ventricular systolic dysfunction (LVSD) and an EF < 40% should be prescribed an angiotensin converting enzyme inhibitor (ACEI) or angiotensin receptor blocker (ARB) at discharge unless a contraindication is documented in the medical record.  This patient is not currently on an ACEI or ARB for HF.  This note is being placed in the record in order to provide documentation that a contraindication to the use of these agents is present for this encounter.  ACE Inhibitor or Angiotensin Receptor Blocker is contraindicated (specify all that apply)  []   ACEI allergy AND ARB allergy []   Angioedema []   Moderate or severe aortic stenosis [x]   Hyperkalemia []   Hypotension []   Renal artery stenosis [x]   Worsening renal function, preexisting renal disease or dysfunction [x]   Started on comfort care measures  Benetta Spar, PharmD, BCPS, [place] Pharmacist  Please check AMION for all [place] phone numbers After 10:00 PM, call [place]

## 2019-12-28 NOTE — Progress Notes (Signed)
PROGRESS NOTE    [name] [name]  LKG:[id number] DOB: July 12, 1926 DOA: 12/09/2019 PCP: [name], MD    Brief Narrative:  [name] [name] is a 86 y.o. male with medical history significant for hx of DVT, a-fib, CKD, HTN, bilateral AKA.  Presented from home with swelling of lower extremities and scrotum.  He also reported having shortness of breath that is progressively worsened over the last few days.  He reports he has not had any chest pain or pressure but does have intermittent palpitations which is not uncommon for him.  At the emergency room, he was found with significant edema bilateral lower extremity stumps as well as scrotum and penis.  Was given bolus IV Lasix.  BNP elevated more than 3000.  Troponin mildly elevated but flat.  Without any chest pain.  Creatinine elevated from baseline.  Admitted with acute CHF exacerbation.  Last known ejection fraction 25%. Also found to have COVID-19 positivity.  Unvaccinated.  Patient declined to get any vaccines.   Assessment & Plan:   Principal Problem:   Acute on chronic systolic CHF (congestive heart failure) (HCC) Active Problems:   HTN (hypertension)   S/P AKA (above knee amputation) bilateral (HCC)   Scrotal swelling   Acute kidney injury superimposed on CKD (HCC)   Atrial fibrillation, chronic ([place])   Advanced care planning/counseling discussion   Palliative care by specialist   COVID-19   DNR (do not resuscitate)   Comfort measures only status  Acute on chronic systolic congestive heart failure: With acute renal failure. Not responding to maximum medical therapy.  Ejection fraction 25%.  Not tolerating treatment.   Started comfort care measures.    Acute kidney injury superimposed on chronic kidney disease stage IV:   Chronic atrial fibrillation:   Status post above-knee amputation bilateral:   Hypoglycemia: End-stage.  COVID-19 virus infection in an unvaccinated adult: Not many pulmonary symptoms.  Mostly related to heart  failure symptoms. COVID-19 Labs  No results for input(s): DDIMER, FERRITIN, LDH, CRP in the last 72 hours.  Lab Results  Component Value Date   SARSCOV2NAA POSITIVE (A) 12/24/2019   [place] NEGATIVE 09/08/2019   [place] NEGATIVE 08/09/2019   [place] NEGATIVE 03/18/2019   SpO2: 100 %  Failure to thrive/multiple medical comorbidities/frailty and advanced physical debility: Comfort care appropriate.  DVT prophylaxis: Comfort care.   Code Status: DNR Family Communication: None today. Disposition Plan: Status is: Inpatient  Remains inpatient appropriate because: Only able to go to hospice level of care when available.  Dispo: The patient is from: Home              Anticipated d/c is to: Hospice.  May die in the hospital.              Anticipated d/c date is: Unknown at this time.              Patient currently is not medically stable to d/c.  Only stable to transfer to hospice level care.    Consultants:   Palliative medicine  Procedures:   None  Antimicrobials:   None   Subjective: Seen and examined.  Remains comfortable, altered.  Mumbling few words.  Not much response. Objective: Vitals:   12/15/19 0310 12/15/19 0620 12/15/19 0800 12/15/19 1126  BP:  (!) 77/50 (!) 88/62 111/83  Pulse: (!) 101 80 (!) 103   Resp: 13 12 19    Temp:  98.4 F (36.9 C) 97.9 F (36.6 C) 98 F (36.7 C)  TempSrc:  Oral  Axillary   SpO2: 99% 100% 100%   Weight:        Intake/Output Summary (Last 24 hours) at 19-Dec-2019 1322 Last data filed at 2019/12/19 0300 Gross per 24 hour  Intake 450.24 ml  Output --  Net 450.24 ml   Filed Weights   12/20/2019 1210 12/15/19 0230  Weight: 108.9 kg 80.4 kg    Examination:  General exam: Chronically sick looking.  Looks comfortable.  Frail gentleman who looks sick and profoundly debilitated but currently sleepy and confused.  Respiratory system: No added sounds.  Some conducted airway sounds. Cardiovascular system: S1 & S2  heard, regular. Gastrointestinal system: Abdomen is nondistended, soft and nontender. No organomegaly or masses felt. Normal bowel sounds heard. Extremities: Bilateral above-knee amputation stump clean and dry. 2+ edema on the upper thighs.  3+ edema on penis and scrotum.    Data Reviewed: I have personally reviewed following labs and imaging studies  CBC: Recent Labs  Lab 12/24/2019 1752  WBC 5.2  NEUTROABS 4.5  HGB 15.9  HCT 49.7  MCV 106.0*  PLT PLATELET CLUMPS NOTED ON SMEAR, UNABLE TO ESTIMATE   Basic Metabolic Panel: Recent Labs  Lab 12/27/2019 1752 12/15/19 0509  NA 135 136  K 5.0 5.7*  CL 103 104  CO2 20* 20*  GLUCOSE 134* 46*  BUN 43* 51*  CREATININE 2.35* 2.77*  CALCIUM 8.6* 8.4*  MG 2.1  --    GFR: Estimated Creatinine Clearance: 11.2 mL/min (A) (by C-G formula based on SCr of 2.77 mg/dL (H)). Liver Function Tests: Recent Labs  Lab 12/01/2019 1752  AST 33  ALT 21  ALKPHOS 106  BILITOT 3.5*  PROT 5.9*  ALBUMIN 2.3*   Recent Labs  Lab 12/23/2019 1752  LIPASE 17   No results for input(s): AMMONIA in the last 168 hours. Coagulation Profile: No results for input(s): INR, PROTIME in the last 168 hours. Cardiac Enzymes: No results for input(s): CKTOTAL, CKMB, CKMBINDEX, TROPONINI in the last 168 hours. BNP (last 3 results) No results for input(s): PROBNP in the last 8760 hours. HbA1C: No results for input(s): HGBA1C in the last 72 hours. CBG: Recent Labs  Lab 12/14/19 0135 12/15/19 0917 12/15/19 1236  GLUCAP 85 36* 77   Lipid Profile: No results for input(s): CHOL, HDL, LDLCALC, TRIG, CHOLHDL, LDLDIRECT in the last 72 hours. Thyroid Function Tests: No results for input(s): TSH, T4TOTAL, FREET4, T3FREE, THYROIDAB in the last 72 hours. Anemia Panel: No results for input(s): VITAMINB12, FOLATE, FERRITIN, TIBC, IRON, RETICCTPCT in the last 72 hours. Sepsis Labs: No results for input(s): PROCALCITON, LATICACIDVEN in the last 168 hours.  Recent  Results (from the past 240 hour(s))  SARS Coronavirus 2 by RT PCR (hospital order, performed in [facility] hospital lab) Nasopharyngeal Nasopharyngeal Swab     Status: Abnormal   Collection Time: 12/21/2019  6:19 PM   Specimen: Nasopharyngeal Swab  Result Value Ref Range Status   SARS Coronavirus 2 POSITIVE (A) NEGATIVE Final    Comment: RESULT CALLED TO, READ BACK BY AND VERIFIED WITH: G KOPP RN 2120 12/02/2019 A BROWNING (NOTE) SARS-CoV-2 target nucleic acids are DETECTED  SARS-CoV-2 RNA is generally detectable in upper respiratory specimens  during the acute phase of infection.  Positive results are indicative  of the presence of the identified virus, but do not rule out bacterial infection or co-infection with other pathogens not detected by the test.  Clinical correlation with patient history and  other diagnostic information is necessary to determine patient infection  status.  The expected result is negative.  Fact Sheet for Patients:   StrictlyIdeas.no   Fact Sheet for Healthcare Providers:   BankingDealers.co.za    This test is not yet approved or cleared by the [place] FDA and  has been authorized for detection and/or diagnosis of SARS-CoV-2 by FDA under an Emergency Use Authorization (EUA).  This EUA will remain in effect (meaning this tes t can be used) for the duration of  the COVID-19 declaration under Section 564(b)(1) of the Act, 21 U.S.C. section 360-bbb-3(b)(1), unless the authorization is terminated or revoked sooner.  Performed at [facility], [place] [address] [place], [place] [address]          Radiology Studies: ECHOCARDIOGRAM COMPLETE  Result Date: 12/14/2019    ECHOCARDIOGRAM REPORT   Patient Name:   AKASHDEEP CHUBA Date of Exam: 12/14/2019 Medical Rec #:  [id number]   Height:       48.0 in Accession #:    [id number]  Weight:       240.0 lb Date of Birth:  Sep 23, 1926    BSA:          1.716 m Patient  Age:    6 years    BP:           102/80 mmHg Patient Gender: M           HR:           108 bpm. Exam Location:  Inpatient Procedure: 2D Echo Indications:    CHF 428  History:        Patient has prior history of Echocardiogram examinations, most                 recent 08/10/2019. CHF; Risk Factors:Diabetes, Hypertension and                 Former Smoker.  Sonographer:    Jannett Celestine RDCS (AE) Referring Phys: [id number] Eben Burow  Sonographer Comments: additional apical images obtained at end of study w/ particularly more gain. see comments IMPRESSIONS  1. Diffuse hypokinesis worse in the septum and apex Spontaneous contrast in LV apex no formed thrombus noted . Left ventricular ejection fraction, by estimation, is 25%. The left ventricle has normal function. The left ventricle demonstrates global hypokinesis. The left ventricular internal cavity size was mildly dilated. There is moderate left ventricular hypertrophy. Left ventricular diastolic parameters are indeterminate.  2. Right ventricular systolic function is normal. The right ventricular size is normal.  3. Left atrial size was moderately dilated.  4. The mitral valve is normal in structure. Mild mitral valve regurgitation. No evidence of mitral stenosis.  5. The aortic valve is tricuspid. Aortic valve regurgitation is not visualized. Mild to moderate aortic valve sclerosis/calcification is present, without any evidence of aortic stenosis.  6. Aortic dilatation noted. There is moderate dilatation of the ascending aorta measuring 43 mm.  7. The inferior vena cava is normal in size with greater than 50% respiratory variability, suggesting right atrial pressure of 3 mmHg. FINDINGS  Left Ventricle: Diffuse hypokinesis worse in the septum and apex Spontaneous contrast in LV apex no formed thrombus noted. Left ventricular ejection fraction, by estimation, is 25%. The left ventricle has normal function. The left ventricle demonstrates  global hypokinesis. The  left ventricular internal cavity size was mildly dilated. There is moderate left ventricular hypertrophy. Left ventricular diastolic parameters are indeterminate. Right Ventricle: The right ventricular size is normal. No increase in right ventricular wall thickness.  Right ventricular systolic function is normal. Left Atrium: Left atrial size was moderately dilated. Right Atrium: Right atrial size was normal in size. Pericardium: There is no evidence of pericardial effusion. Mitral Valve: The mitral valve is normal in structure. Normal mobility of the mitral valve leaflets. Mild mitral valve regurgitation. No evidence of mitral valve stenosis. Tricuspid Valve: The tricuspid valve is normal in structure. Tricuspid valve regurgitation is mild . No evidence of tricuspid stenosis. Aortic Valve: The aortic valve is tricuspid. Aortic valve regurgitation is not visualized. Mild to moderate aortic valve sclerosis/calcification is present, without any evidence of aortic stenosis. Pulmonic Valve: The pulmonic valve was normal in structure. Pulmonic valve regurgitation is not visualized. No evidence of pulmonic stenosis. Aorta: The aortic root is normal in size and structure and aortic dilatation noted. There is moderate dilatation of the ascending aorta measuring 43 mm. Venous: The inferior vena cava is normal in size with greater than 50% respiratory variability, suggesting right atrial pressure of 3 mmHg. IAS/Shunts: No atrial level shunt detected by color flow Doppler.  LEFT VENTRICLE PLAX 2D LVIDd:         4.50 cm LVIDs:         3.30 cm LV PW:         1.40 cm LV IVS:        1.30 cm LVOT diam:     2.20 cm LV SV:         33 LV SV Index:   19 LVOT Area:     3.80 cm  LEFT ATRIUM             Index       RIGHT ATRIUM           Index LA diam:        4.50 cm 2.62 cm/m  RA Area:     19.90 cm LA Vol (A2C):   52.9 ml 30.84 ml/m RA Volume:   51.20 ml  29.85 ml/m LA Vol (A4C):   44.8 ml 26.11 ml/m LA Biplane Vol: 49.6 ml 28.91  ml/m  AORTIC VALVE LVOT Vmax:   62.00 cm/s LVOT Vmean:  46.400 cm/s LVOT VTI:    0.088 m  AORTA Ao Root diam: 3.50 cm MITRAL VALVE MV Area (PHT): 4.31 cm     SHUNTS MV Decel Time: 176 msec     Systemic VTI:  0.09 m MV E velocity: 118.00 cm/s  Systemic Diam: 2.20 cm Jenkins Rouge MD Electronically signed by Jenkins Rouge MD Signature Date/Time: 12/14/2019/3:18:05 PM    Final         Scheduled Meds: . famotidine  20 mg Oral Daily   Continuous Infusions:    LOS: 3 days    Time spent: 30 minutes    Barb Merino, MD [facility] Pager [phone]

## 2019-12-28 NOTE — Death Summary Note (Signed)
DEATH SUMMARY   Patient Details  Name: [name] [name] MRN: [id number] DOB: [birth year]  Admission/Discharge Information   Admit Date:  December 14, 2019  Date of Death: Date of Death: December 17, 2019  Time of Death: Time of Death: 25  Length of Stay: 3  Referring Physician: [name], MD   Reason(s) for Hospitalization  Congestive heart failure  Diagnoses  Preliminary cause of death:  Secondary Diagnoses (including complications and co-morbidities):  Principal Problem:   Acute on chronic systolic CHF (congestive heart failure) ([place]) Active Problems:   HTN (hypertension)   S/P AKA (above knee amputation) bilateral (HCC)   Scrotal swelling   Acute kidney injury superimposed on CKD ([place])   Atrial fibrillation, chronic ([place])   Advanced care planning/counseling discussion   Palliative care by specialist   COVID-19   DNR (do not resuscitate)   Comfort measures only status   Brief Hospital Course (including significant findings, care, treatment, and services provided and events leading to death)  [name] [name] is a 86 y.o. year old male who suffers from extensive medical issues including history of DVT, A. fib, CKD stage IV, hypertension, bilateral above-knee amputation for peripheral vascular disease who used to live at home with his daughter.  Patient with recently worsening health and recurrent hospitalization.  Presented to the emergency room with swelling of lower extremities and swelling of the scrotum that happens with his acute systolic CHF exacerbation.  Patient was also found to have positive COVID-19 test.  Patient was admitted to the hospital for treatment of acute on chronic systolic congestive heart failure with acute renal failure, acute kidney injury superimposed on chronic kidney disease stage IV, chronic atrial fibrillation and COVID-19 virus infection.  With recent worsening health, not responding to maximal medical therapy and ejection fraction 25%, developing hypoglycemia and also  declining treatments he was seen by palliative medicine and a palliative approach was started.  Ultimately he was treated with comfort care measures and end-of-life care was provided in the hospital and ultimately died while in the hospital on 12/17/2019 at 1415.  Causes of death Acute on chronic systolic congestive heart failure Due to coronary artery disease Due to atherosclerotic disease COVID-19 virus infection Acute kidney injury Profound physical debility/hypertension/failure to thrive    Pertinent Labs and Studies  Significant Diagnostic Studies DG Chest Port 1 View  Result Date: 12/14/19 CLINICAL DATA:  Short of breath, bilateral lower extremity edema EXAM: PORTABLE CHEST 1 VIEW COMPARISON:  09/08/2019 FINDINGS: Single frontal view of the chest demonstrates an enlarged cardiac silhouette. Thoracic aorta is ectatic. Bibasilar veiling opacities are seen consistent with effusions and consolidation. Progressive central vascular congestion. No pneumothorax. IMPRESSION: 1. Findings consistent with congestive heart failure. Electronically Signed   By: Randa Ngo M.D.   On: 2019/12/14 18:50   ECHOCARDIOGRAM COMPLETE  Result Date: 12/14/2019    ECHOCARDIOGRAM REPORT   Patient Name:   [name] [name] Date of Exam: 12/14/2019 Medical Rec #:  [id number]   Height:       48.0 in Accession #:    [id number]  Weight:       240.0 lb Date of Birth:  [birth year]    BSA:          1.716 m Patient Age:    86 years    BP:           102/80 mmHg Patient Gender: M           HR:           108 bpm.  Exam Location:  Inpatient Procedure: 2D [name] Indications:    CHF 428  History:        Patient has prior history of Echocardiogram examinations, most                 recent 08/10/2019. CHF; Risk Factors:Diabetes, Hypertension and                 Former Smoker.  Sonographer:    Jannett Celestine RDCS (AE) Referring Phys: [id number] Eben Burow  Sonographer Comments: additional apical images obtained at end of study w/  particularly more gain. see comments IMPRESSIONS  1. Diffuse hypokinesis worse in the septum and apex Spontaneous contrast in LV apex no formed thrombus noted . Left ventricular ejection fraction, by estimation, is 25%. The left ventricle has normal function. The left ventricle demonstrates global hypokinesis. The left ventricular internal cavity size was mildly dilated. There is moderate left ventricular hypertrophy. Left ventricular diastolic parameters are indeterminate.  2. Right ventricular systolic function is normal. The right ventricular size is normal.  3. Left atrial size was moderately dilated.  4. The mitral valve is normal in structure. Mild mitral valve regurgitation. No evidence of mitral stenosis.  5. The aortic valve is tricuspid. Aortic valve regurgitation is not visualized. Mild to moderate aortic valve sclerosis/calcification is present, without any evidence of aortic stenosis.  6. Aortic dilatation noted. There is moderate dilatation of the ascending aorta measuring 43 mm.  7. The inferior vena cava is normal in size with greater than 50% respiratory variability, suggesting right atrial pressure of 3 mmHg. FINDINGS  Left Ventricle: Diffuse hypokinesis worse in the septum and apex Spontaneous contrast in LV apex no formed thrombus noted. Left ventricular ejection fraction, by estimation, is 25%. The left ventricle has normal function. The left ventricle demonstrates  global hypokinesis. The left ventricular internal cavity size was mildly dilated. There is moderate left ventricular hypertrophy. Left ventricular diastolic parameters are indeterminate. Right Ventricle: The right ventricular size is normal. No increase in right ventricular wall thickness. Right ventricular systolic function is normal. Left Atrium: Left atrial size was moderately dilated. Right Atrium: Right atrial size was normal in size. Pericardium: There is no evidence of pericardial effusion. Mitral Valve: The mitral valve is  normal in structure. Normal mobility of the mitral valve leaflets. Mild mitral valve regurgitation. No evidence of mitral valve stenosis. Tricuspid Valve: The tricuspid valve is normal in structure. Tricuspid valve regurgitation is mild . No evidence of tricuspid stenosis. Aortic Valve: The aortic valve is tricuspid. Aortic valve regurgitation is not visualized. Mild to moderate aortic valve sclerosis/calcification is present, without any evidence of aortic stenosis. Pulmonic Valve: The pulmonic valve was normal in structure. Pulmonic valve regurgitation is not visualized. No evidence of pulmonic stenosis. Aorta: The aortic root is normal in size and structure and aortic dilatation noted. There is moderate dilatation of the ascending aorta measuring 43 mm. Venous: The inferior vena cava is normal in size with greater than 50% respiratory variability, suggesting right atrial pressure of 3 mmHg. IAS/Shunts: No atrial level shunt detected by color flow Doppler.  LEFT VENTRICLE PLAX 2D LVIDd:         4.50 cm LVIDs:         3.30 cm LV PW:         1.40 cm LV IVS:        1.30 cm LVOT diam:     2.20 cm LV SV:  33 LV SV Index:   19 LVOT Area:     3.80 cm  LEFT ATRIUM             Index       RIGHT ATRIUM           Index LA diam:        4.50 cm 2.62 cm/m  RA Area:     19.90 cm LA Vol (A2C):   52.9 ml 30.84 ml/m RA Volume:   51.20 ml  29.85 ml/m LA Vol (A4C):   44.8 ml 26.11 ml/m LA Biplane Vol: 49.6 ml 28.91 ml/m  AORTIC VALVE LVOT Vmax:   62.00 cm/s LVOT Vmean:  46.400 cm/s LVOT VTI:    0.088 m  AORTA Ao Root diam: 3.50 cm MITRAL VALVE MV Area (PHT): 4.31 cm     SHUNTS MV Decel Time: 176 msec     Systemic VTI:  0.09 m MV E velocity: 118.00 cm/s  Systemic Diam: 2.20 cm Jenkins Rouge MD Electronically signed by Jenkins Rouge MD Signature Date/Time: 12/14/2019/3:18:05 PM    Final     Microbiology Recent Results (from the past 240 hour(s))  SARS Coronavirus 2 by RT PCR (hospital order, performed in [facility]  hospital lab) Nasopharyngeal Nasopharyngeal Swab     Status: Abnormal   Collection Time: 12/12/2019  6:19 PM   Specimen: Nasopharyngeal Swab  Result Value Ref Range Status   SARS Coronavirus 2 POSITIVE (A) NEGATIVE Final    Comment: RESULT CALLED TO, READ BACK BY AND VERIFIED WITH: G KOPP RN 2120 12/18/2019 A BROWNING (NOTE) SARS-CoV-2 target nucleic acids are DETECTED  SARS-CoV-2 RNA is generally detectable in upper respiratory specimens  during the acute phase of infection.  Positive results are indicative  of the presence of the identified virus, but do not rule out bacterial infection or co-infection with other pathogens not detected by the test.  Clinical correlation with patient history and  other diagnostic information is necessary to determine patient infection status.  The expected result is negative.  Fact Sheet for Patients:   StrictlyIdeas.no   Fact Sheet for Healthcare Providers:   BankingDealers.co.za    This test is not yet approved or cleared by the [place] FDA and  has been authorized for detection and/or diagnosis of SARS-CoV-2 by FDA under an Emergency Use Authorization (EUA).  This EUA will remain in effect (meaning this tes t can be used) for the duration of  the COVID-19 declaration under Section 564(b)(1) of the Act, 21 U.S.C. section 360-bbb-3(b)(1), unless the authorization is terminated or revoked sooner.  Performed at [facility], [place] [address], [place],  [address]     Lab Basic Metabolic Panel: Recent Labs  Lab 12/12/2019 1752 12/15/19 0509  NA 135 136  K 5.0 5.7*  CL 103 104  CO2 20* 20*  GLUCOSE 134* 46*  BUN 43* 51*  CREATININE 2.35* 2.77*  CALCIUM 8.6* 8.4*  MG 2.1  --    Liver Function Tests: Recent Labs  Lab 12/24/2019 1752  AST 33  ALT 21  ALKPHOS 106  BILITOT 3.5*  PROT 5.9*  ALBUMIN 2.3*   Recent Labs  Lab 12/19/2019 1752  LIPASE 17   No results for  input(s): AMMONIA in the last 168 hours. CBC: Recent Labs  Lab 11/30/2019 1752  WBC 5.2  NEUTROABS 4.5  HGB 15.9  HCT 49.7  MCV 106.0*  PLT PLATELET CLUMPS NOTED ON SMEAR, UNABLE TO ESTIMATE   Cardiac Enzymes:  No results for input(s): CKTOTAL, CKMB, CKMBINDEX, TROPONINI in the last 168 hours. Sepsis Labs: Recent Labs  Lab 12/15/2019 1752  WBC 5.2    Procedures/Operations     Krishav Mamone January 08, 2020, 6:10 PM

## 2019-12-28 DEATH — deceased

## 2020-01-13 ENCOUNTER — Ambulatory Visit: Payer: Medicare Other | Admitting: Family Medicine

## 2020-06-25 IMAGING — US US SCROTUM W/ DOPPLER COMPLETE
1 series · 13 of 25 positions shown · non-contrast
Comparison: Pelvic radiograph 03/18/2019

CLINICAL DATA: Scrotal swelling

EXAM:
SCROTAL ULTRASOUND
DOPPLER ULTRASOUND OF THE TESTICLES
TECHNIQUE: Complete ultrasound examination of the testicles, epididymis, and
other scrotal structures was performed. Color and spectral Doppler
ultrasound were also utilized to evaluate blood flow to the
testicles.

[Series 1: us scrotum w/ doppler complete · 13 of 58 slices shown]
[im 1/58]
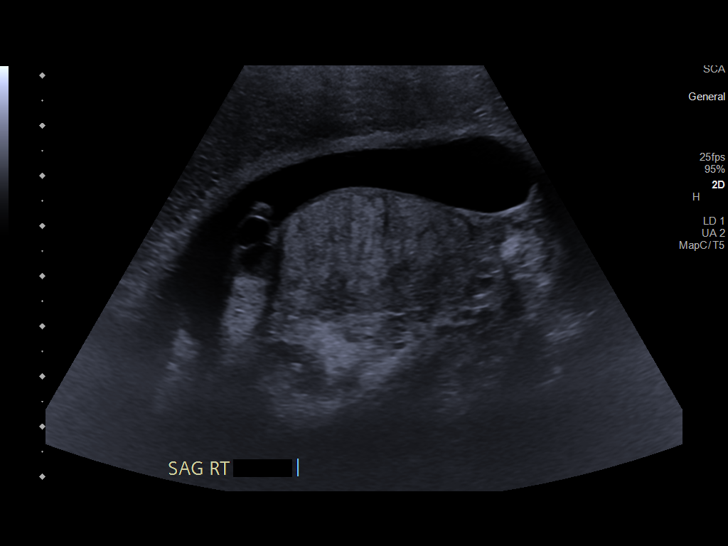
[im 5/58]
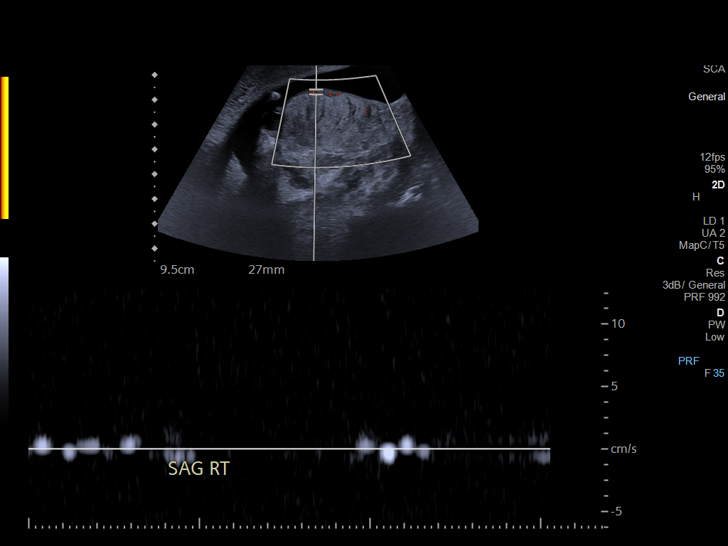
[im 10/58]
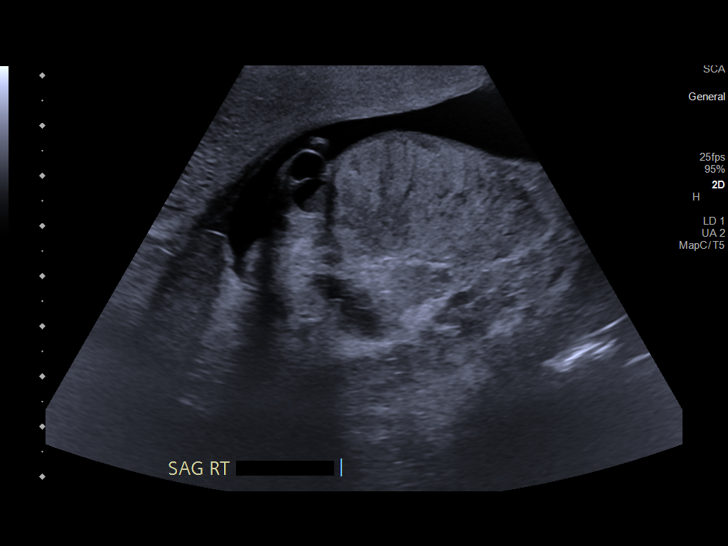
[im 15/58]
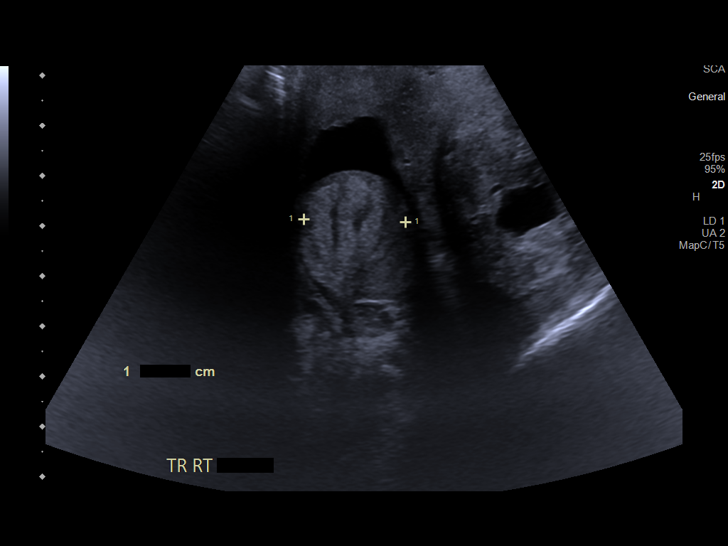
[im 20/58]
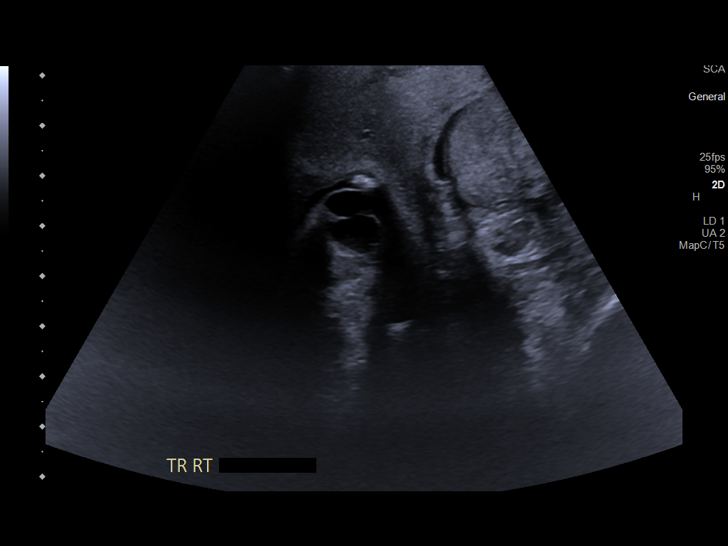
[im 24/58]
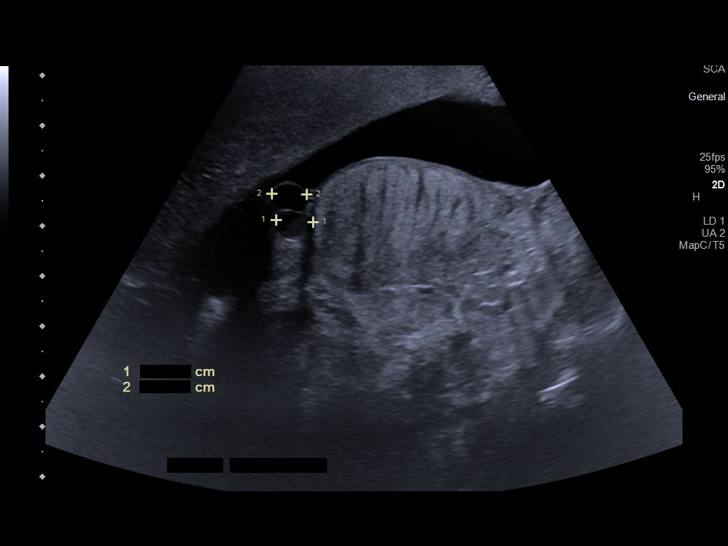
[im 29/58]
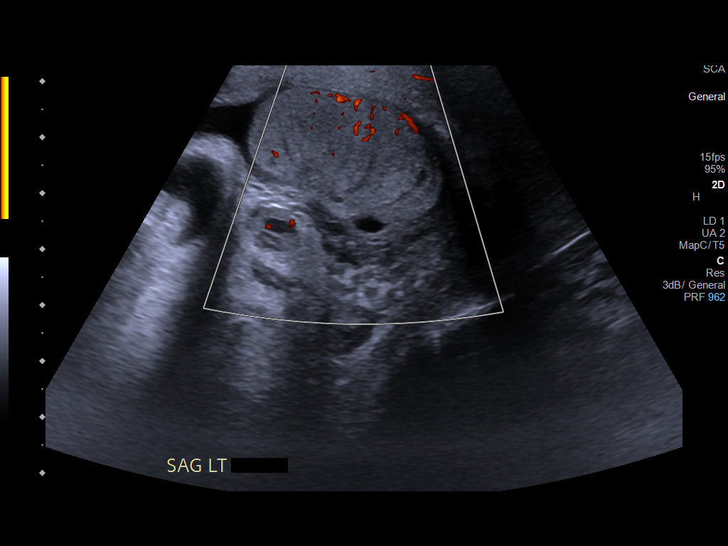
[im 34/58]
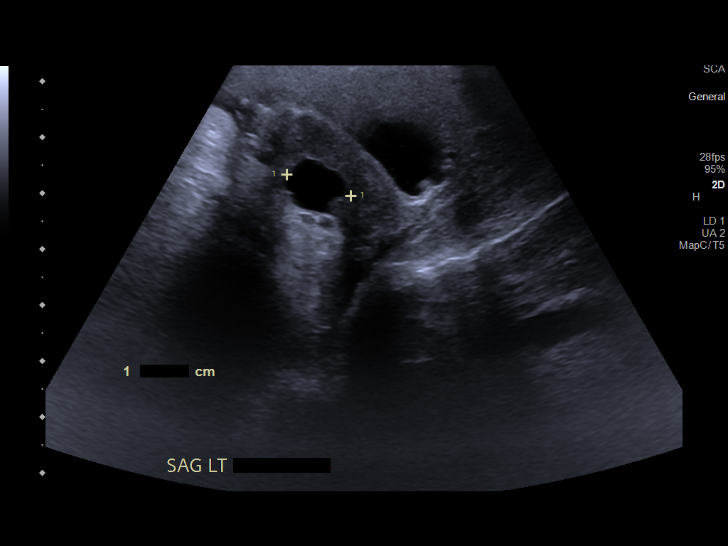
[im 39/58]
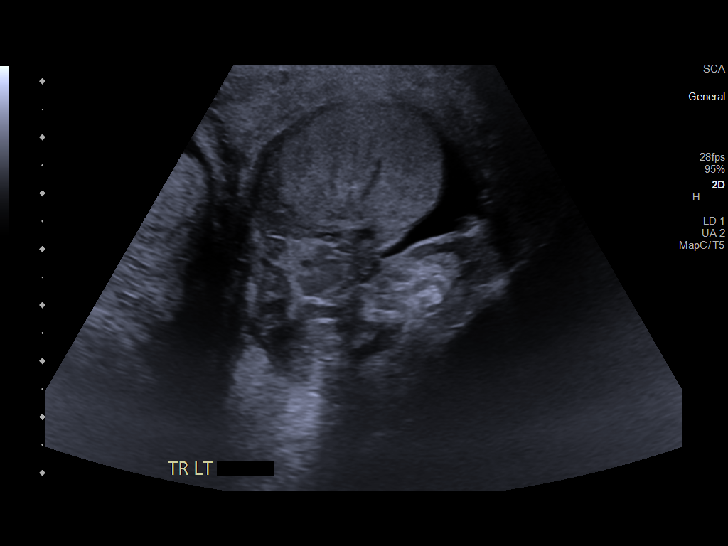
[im 43/58]
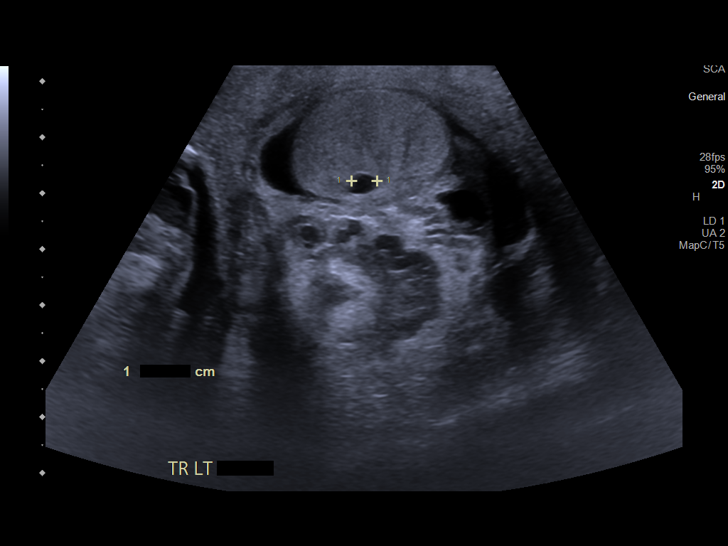
[im 48/58]
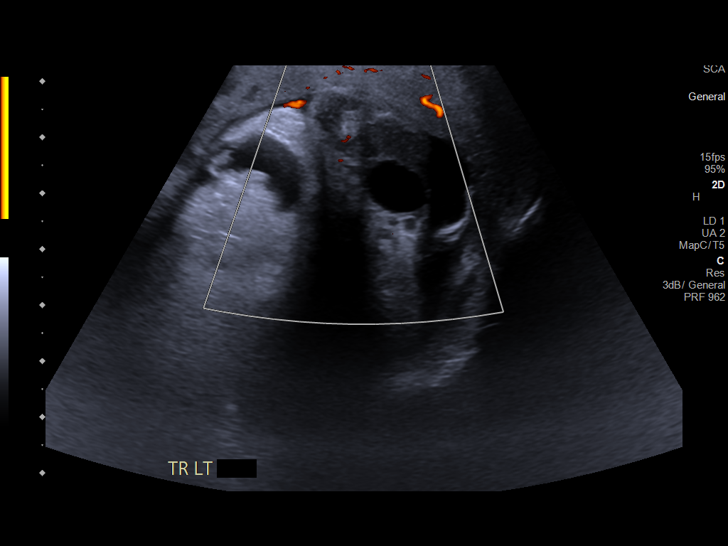
[im 53/58]
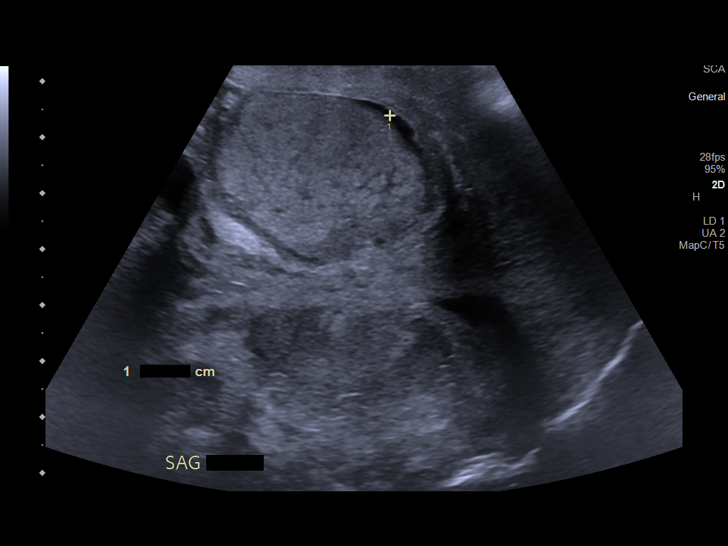
[im 58/58]
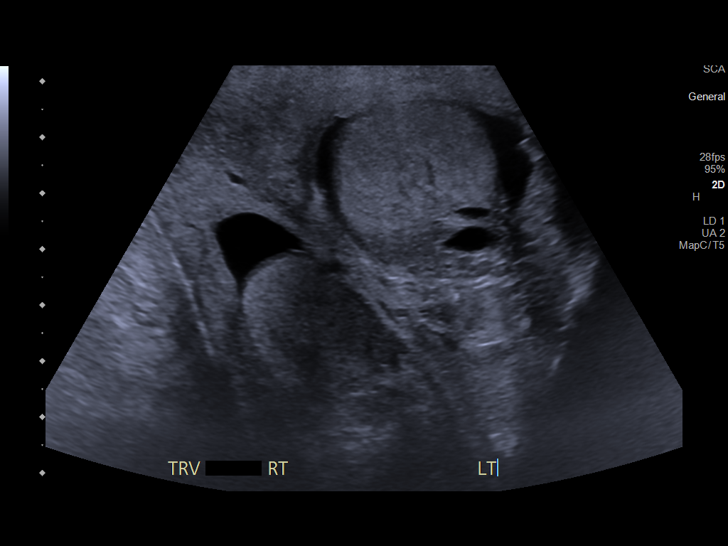

[13 of 25 positions shown; findings below may reference images not displayed]

FINDINGS: Right testicle

Measurements: 3.9 x 2.5 x 2 cm. Striated appearance of the right
testicle. No focal testicular mass.

Left testicle

Measurements: 3.8 x 1.9 x 2.8 cm. Stride appearance of the left
testicle. No testicular mass.

Right epididymis: Normal in size and appearance. Few small
subcentimeter anechoic cysts. Small 6 mm calcification adjacent
epididymal head likely a small epididymal appendix.

Left epididymis: Normal size and appearance. Anechoic cyst in the
left epididymal body measuring up to 1 cm.

Hydrocele:  Moderate bilateral hydroceles.

Varicocele:  Small left varicocele.  No right varicocele.

Other: Diffuse scrotal skin thickening.

Pulsed Doppler interrogation of both testes demonstrates normal low
resistance arterial and venous waveforms bilaterally.
IMPRESSION: Striated appearance of the testes, typically reflective of
interstitial fibrosis and in elderly male patient though given skin
thickening and swelling, infection is not fully excluded.

Normal Doppler waveforms, difficulty in obtaining the right
waveforms is likely related technical factors.

Small epididymal appendix adjacent the right epididymis. Bilateral a
epididymal cysts.

Moderate bilateral hydroceles.  Small left varicocele.
